# Patient Record
Sex: Female | Born: 1960 | Race: White | Hispanic: No | Marital: Single | Smoking: Never smoker
Health system: Southern US, Community
[De-identification: ages and names within clinical notes are randomized; demographics above are authoritative.]

## PROBLEM LIST (undated history)

## (undated) DIAGNOSIS — C801 Malignant (primary) neoplasm, unspecified: Secondary | ICD-10-CM

## (undated) DIAGNOSIS — C50919 Malignant neoplasm of unspecified site of unspecified female breast: Secondary | ICD-10-CM

## (undated) DIAGNOSIS — Z803 Family history of malignant neoplasm of breast: Secondary | ICD-10-CM

## (undated) DIAGNOSIS — Z923 Personal history of irradiation: Secondary | ICD-10-CM

## (undated) DIAGNOSIS — E039 Hypothyroidism, unspecified: Secondary | ICD-10-CM

## (undated) DIAGNOSIS — E079 Disorder of thyroid, unspecified: Secondary | ICD-10-CM

## (undated) HISTORY — DX: Disorder of thyroid, unspecified: E07.9

## (undated) HISTORY — PX: WISDOM TOOTH EXTRACTION: SHX21

## (undated) HISTORY — DX: Family history of malignant neoplasm of breast: Z80.3

## (undated) HISTORY — PX: TONSILLECTOMY: SUR1361

## (undated) MED FILL — Fosaprepitant Dimeglumine For IV Infusion 150 MG (Base Eq): INTRAVENOUS | Qty: 5 | Status: AC

---
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
## 1997-12-19 ENCOUNTER — Other Ambulatory Visit: Admission: RE | Admit: 1997-12-19 | Discharge: 1997-12-19 | Payer: Self-pay | Admitting: Obstetrics and Gynecology

## 1999-01-14 ENCOUNTER — Other Ambulatory Visit: Admission: RE | Admit: 1999-01-14 | Discharge: 1999-01-14 | Payer: Self-pay | Admitting: Obstetrics and Gynecology

## 2000-03-10 ENCOUNTER — Other Ambulatory Visit: Admission: RE | Admit: 2000-03-10 | Discharge: 2000-03-10 | Payer: Self-pay | Admitting: Obstetrics and Gynecology

## 2001-03-22 ENCOUNTER — Other Ambulatory Visit: Admission: RE | Admit: 2001-03-22 | Discharge: 2001-03-22 | Payer: Self-pay | Admitting: Obstetrics and Gynecology

## 2002-04-04 ENCOUNTER — Encounter: Admission: RE | Admit: 2002-04-04 | Discharge: 2002-04-04 | Payer: Self-pay | Admitting: Vascular Surgery

## 2002-04-04 ENCOUNTER — Encounter: Payer: Self-pay | Admitting: Obstetrics and Gynecology

## 2002-04-24 ENCOUNTER — Other Ambulatory Visit: Admission: RE | Admit: 2002-04-24 | Discharge: 2002-04-24 | Payer: Self-pay | Admitting: Obstetrics and Gynecology

## 2003-04-20 ENCOUNTER — Encounter: Admission: RE | Admit: 2003-04-20 | Discharge: 2003-04-20 | Payer: Self-pay | Admitting: Obstetrics and Gynecology

## 2003-05-07 ENCOUNTER — Other Ambulatory Visit: Admission: RE | Admit: 2003-05-07 | Discharge: 2003-05-07 | Payer: Self-pay | Admitting: Obstetrics and Gynecology

## 2004-06-02 ENCOUNTER — Encounter: Admission: RE | Admit: 2004-06-02 | Discharge: 2004-06-02 | Payer: Self-pay | Admitting: Obstetrics and Gynecology

## 2005-06-04 ENCOUNTER — Encounter: Admission: RE | Admit: 2005-06-04 | Discharge: 2005-06-04 | Payer: Self-pay | Admitting: Obstetrics and Gynecology

## 2006-06-07 ENCOUNTER — Encounter: Admission: RE | Admit: 2006-06-07 | Discharge: 2006-06-07 | Payer: Self-pay | Admitting: Obstetrics and Gynecology

## 2007-06-13 ENCOUNTER — Encounter: Admission: RE | Admit: 2007-06-13 | Discharge: 2007-06-13 | Payer: Self-pay | Admitting: Obstetrics and Gynecology

## 2008-06-18 ENCOUNTER — Encounter: Admission: RE | Admit: 2008-06-18 | Discharge: 2008-06-18 | Payer: Self-pay | Admitting: Obstetrics and Gynecology

## 2009-07-02 ENCOUNTER — Encounter: Admission: RE | Admit: 2009-07-02 | Discharge: 2009-07-02 | Payer: Self-pay | Admitting: Obstetrics and Gynecology

## 2010-06-19 ENCOUNTER — Other Ambulatory Visit: Payer: Self-pay | Admitting: Family Medicine

## 2010-06-19 DIAGNOSIS — Z1231 Encounter for screening mammogram for malignant neoplasm of breast: Secondary | ICD-10-CM

## 2010-07-08 ENCOUNTER — Ambulatory Visit
Admission: RE | Admit: 2010-07-08 | Discharge: 2010-07-08 | Disposition: A | Discharge status: Home / Self Care | Payer: Federal, State, Local not specified - PPO | Source: Ambulatory Visit | Attending: Family Medicine | Admitting: Family Medicine

## 2010-07-08 DIAGNOSIS — Z1231 Encounter for screening mammogram for malignant neoplasm of breast: Secondary | ICD-10-CM

## 2011-07-27 ENCOUNTER — Other Ambulatory Visit: Payer: Self-pay | Admitting: Family Medicine

## 2011-07-27 DIAGNOSIS — Z1231 Encounter for screening mammogram for malignant neoplasm of breast: Secondary | ICD-10-CM

## 2011-08-04 ENCOUNTER — Other Ambulatory Visit: Payer: Self-pay | Admitting: Family Medicine

## 2011-08-04 ENCOUNTER — Other Ambulatory Visit (HOSPITAL_COMMUNITY)
Admission: RE | Admit: 2011-08-04 | Discharge: 2011-08-04 | Disposition: A | Discharge status: Home / Self Care | Payer: BC Managed Care – PPO | Source: Ambulatory Visit | Attending: Family Medicine | Admitting: Family Medicine

## 2011-08-04 DIAGNOSIS — Z124 Encounter for screening for malignant neoplasm of cervix: Secondary | ICD-10-CM | POA: Insufficient documentation

## 2011-08-11 ENCOUNTER — Ambulatory Visit
Admission: RE | Admit: 2011-08-11 | Discharge: 2011-08-11 | Disposition: A | Discharge status: Home / Self Care | Payer: BC Managed Care – PPO | Source: Ambulatory Visit | Attending: Family Medicine | Admitting: Family Medicine

## 2011-08-11 DIAGNOSIS — Z1231 Encounter for screening mammogram for malignant neoplasm of breast: Secondary | ICD-10-CM

## 2011-08-18 ENCOUNTER — Other Ambulatory Visit: Payer: Self-pay | Admitting: Family Medicine

## 2011-08-18 DIAGNOSIS — R928 Other abnormal and inconclusive findings on diagnostic imaging of breast: Secondary | ICD-10-CM

## 2013-12-15 ENCOUNTER — Ambulatory Visit: Payer: Self-pay | Admitting: Family Medicine

## 2014-01-14 ENCOUNTER — Ambulatory Visit: Payer: Self-pay | Admitting: Family Medicine

## 2014-02-13 ENCOUNTER — Ambulatory Visit: Payer: Self-pay | Admitting: Family Medicine

## 2014-08-16 ENCOUNTER — Other Ambulatory Visit: Payer: Self-pay | Admitting: Family Medicine

## 2014-08-16 ENCOUNTER — Other Ambulatory Visit (HOSPITAL_COMMUNITY)
Admission: RE | Admit: 2014-08-16 | Discharge: 2014-08-16 | Disposition: A | Discharge status: Home / Self Care | Payer: 59 | Source: Ambulatory Visit | Attending: Family Medicine | Admitting: Family Medicine

## 2014-08-16 DIAGNOSIS — Z124 Encounter for screening for malignant neoplasm of cervix: Secondary | ICD-10-CM | POA: Insufficient documentation

## 2014-08-20 LAB — CYTOLOGY - PAP

## 2014-10-18 ENCOUNTER — Encounter: Payer: Self-pay | Admitting: Podiatry

## 2014-10-18 ENCOUNTER — Ambulatory Visit (INDEPENDENT_AMBULATORY_CARE_PROVIDER_SITE_OTHER): Payer: PRIVATE HEALTH INSURANCE | Admitting: Podiatry

## 2014-10-18 ENCOUNTER — Ambulatory Visit (INDEPENDENT_AMBULATORY_CARE_PROVIDER_SITE_OTHER): Payer: PRIVATE HEALTH INSURANCE

## 2014-10-18 VITALS — BP 115/55 | HR 62 | Resp 16 | Ht 71.0 in | Wt 170.0 lb

## 2014-10-18 DIAGNOSIS — M216X2 Other acquired deformities of left foot: Secondary | ICD-10-CM

## 2014-10-18 DIAGNOSIS — M779 Enthesopathy, unspecified: Secondary | ICD-10-CM | POA: Diagnosis not present

## 2014-10-18 DIAGNOSIS — M7742 Metatarsalgia, left foot: Secondary | ICD-10-CM | POA: Diagnosis not present

## 2014-10-18 DIAGNOSIS — M7740 Metatarsalgia, unspecified foot: Secondary | ICD-10-CM | POA: Insufficient documentation

## 2014-10-18 NOTE — Progress Notes (Signed)
Subjective:     Patient ID: Kathryn Lucas, female   DOB: 1960/04/27, 54 y.o.   MRN: [id number]  HPI 54 year old female presents the office today for concerns of pain to the left foot under the ball of the 3rd toe joint. This has been ongoing for about 3 weeks. She believes that this started from wearing bad shoes and she has lost the padding to the ball of the foot. She does not have any pain at rest and only has pain with weightbearing at times. Denies any history of injury or trauma. Denies any swelling or redness. She has been wearing a gel pad to the ball of the foot which seems to help. No other complaints at his time.   Review of Systems  All other systems reviewed and are negative.      Objective:   Physical Exam AAO x3, NAD DP/PT pulses palpable bilaterally, CRT less than 3 seconds Protective sensation intact with Simms Weinstein monofilament, vibratory sensation intact, Achilles tendon reflex intact No areas of tenderness to bilateral lower extremities at this time. This prominence the metatarsal heads plantarly with atrophy of the fat pad. There is no specific area pinpoint bony tenderness or pain the vibratory sensation. No pain with MTPJ range of motion. There is no edema, erythema, increased warmth. MMT 5/5, ROM WNL. Adductovarus rotation of the 4th and 5th toes bilaterally. Decrease in medial arch height bilaterally.  No open lesions or pre-ulcerative lesions.  No overlying edema, erythema, increase in warmth to bilateral lower extremities.  No pain with calf compression, swelling, warmth, erythema bilaterally.      Assessment:     Metatarsalgia, fat pad atrophy with  prominent metatarsal heads.    Plan:     -Treatment options discussed including all alternatives, risks, and complications -X-rays were obtained and reviewed with the patient.  -Discussed etiology of her symptoms.  -Disensed metatarsal pads to help offload the area -Discussed orthotics. She will consider CMO.   -Follow-up as needed orif any problems arise or is symptoms are not resolved in the next 4 weeks. In the meantime, encouraged to call the office with any questions, concerns, change in symptoms.   Celesta Gentile, DPM

## 2014-10-22 DIAGNOSIS — M79673 Pain in unspecified foot: Secondary | ICD-10-CM

## 2014-12-06 DIAGNOSIS — M79673 Pain in unspecified foot: Secondary | ICD-10-CM

## 2015-07-19 DIAGNOSIS — M79673 Pain in unspecified foot: Secondary | ICD-10-CM

## 2015-07-30 ENCOUNTER — Encounter: Payer: Self-pay | Admitting: Podiatry

## 2015-07-30 ENCOUNTER — Ambulatory Visit (INDEPENDENT_AMBULATORY_CARE_PROVIDER_SITE_OTHER): Payer: PRIVATE HEALTH INSURANCE | Admitting: Podiatry

## 2015-07-30 ENCOUNTER — Ambulatory Visit (INDEPENDENT_AMBULATORY_CARE_PROVIDER_SITE_OTHER): Payer: PRIVATE HEALTH INSURANCE

## 2015-07-30 VITALS — BP 102/57 | HR 63 | Resp 18

## 2015-07-30 DIAGNOSIS — M722 Plantar fascial fibromatosis: Secondary | ICD-10-CM

## 2015-07-30 DIAGNOSIS — R52 Pain, unspecified: Secondary | ICD-10-CM

## 2015-07-30 MED ORDER — MELOXICAM 15 MG PO TABS: 15.0000 mg | ORAL_TABLET | Freq: Every day | ORAL | Status: DC

## 2015-07-30 NOTE — Progress Notes (Signed)
Patient ID: Kathryn Lucas, female   DOB: [birth year], 55 y.o.   MRN: [id number]  Subjective: 55 year old female presents the office with concerns of right heel pain which is been ongoing the last 1 month. She states that she has pain in the morning and she first gets up and after walking and exercising. She denies any recent injury or trauma. No swelling or redness. No tingling or numbness. She has been icing as well as some stretching that her trainer instructed her to do.Denies any systemic complaints such as fevers, chills, nausea, vomiting. No acute changes since last appointment, and no other complaints at this time.   Objective: AAO x3, NAD DP/PT pulses palpable bilaterally, CRT less than 3 seconds Tenderness to palpation along the plantar medial tubercle of the calcaneus at the insertion of plantar fascia on the right foot. There is no pain along the course of the plantar fascia within the arch of the foot. Plantar fascia appears to be intact. There is no pain with lateral compression of the calcaneus or pain with vibratory sensation. There is no pain along the course or insertion of the achilles tendon. No other areas of tenderness to bilateral lower extremities. No areas of pinpoint bony tenderness or pain with vibratory sensation. MMT 5/5, ROM WNL. No edema, erythema, increase in warmth to bilateral lower extremities.  No open lesions or pre-ulcerative lesions.  No pain with calf compression, swelling, warmth, erythema  Assessment: Right heel pain, likely plantar fasciitis  Plan: -All treatment options discussed with the patient including all alternatives, risks, complications.  -X-rays were obtained and reviewed with the patient. No significant calcaneal spurring is present. No evidence of acute fracture or stress fracture. -Discussed steroid injection but she wishes to hold off. -Prescribed mobic. Discussed side effects of the medication and directed to stop if any are to occur and call  the office.  -Plantar fascial brace -Ice -Stretching -Discussed orthotics -Follow-up in 4 weeks or sooner if any problems arise. In the meantime, encouraged to call the office with any questions, concerns, change in symptoms.   Celesta Gentile, DPM

## 2015-07-30 NOTE — Patient Instructions (Signed)

## 2015-08-27 ENCOUNTER — Ambulatory Visit: Payer: PRIVATE HEALTH INSURANCE | Admitting: Podiatry

## 2016-03-16 HISTORY — PX: BREAST BIOPSY: SHX20

## 2016-06-02 ENCOUNTER — Other Ambulatory Visit: Payer: Self-pay | Admitting: Family Medicine

## 2016-06-02 DIAGNOSIS — R928 Other abnormal and inconclusive findings on diagnostic imaging of breast: Secondary | ICD-10-CM

## 2016-06-11 ENCOUNTER — Ambulatory Visit
Admission: RE | Admit: 2016-06-11 | Discharge: 2016-06-11 | Disposition: A | Discharge status: Home / Self Care | Payer: 59 | Source: Ambulatory Visit | Attending: Family Medicine | Admitting: Family Medicine

## 2016-06-11 ENCOUNTER — Other Ambulatory Visit: Payer: Self-pay | Admitting: Family Medicine

## 2016-06-11 DIAGNOSIS — N6320 Unspecified lump in the left breast, unspecified quadrant: Secondary | ICD-10-CM

## 2016-06-11 DIAGNOSIS — R928 Other abnormal and inconclusive findings on diagnostic imaging of breast: Secondary | ICD-10-CM

## 2016-06-19 ENCOUNTER — Ambulatory Visit
Admission: RE | Admit: 2016-06-19 | Discharge: 2016-06-19 | Disposition: A | Discharge status: Home / Self Care | Payer: 59 | Source: Ambulatory Visit | Attending: Family Medicine | Admitting: Family Medicine

## 2016-06-19 ENCOUNTER — Other Ambulatory Visit: Payer: Self-pay | Admitting: Family Medicine

## 2016-06-19 DIAGNOSIS — N6320 Unspecified lump in the left breast, unspecified quadrant: Secondary | ICD-10-CM

## 2016-07-07 ENCOUNTER — Other Ambulatory Visit: Payer: Self-pay | Admitting: General Surgery

## 2016-07-07 ENCOUNTER — Telehealth: Payer: Self-pay | Admitting: Hematology and Oncology

## 2016-07-07 DIAGNOSIS — C50512 Malignant neoplasm of lower-outer quadrant of left female breast: Secondary | ICD-10-CM

## 2016-07-07 NOTE — Telephone Encounter (Signed)
Appt has been scheduled for the pt to see Dr. Lindi Adie on 5/2 at 830am. Unable to reach the pt. Lft vm w/appt date and time. Letter mailed to the pt.

## 2016-07-08 ENCOUNTER — Encounter: Payer: Self-pay | Admitting: Hematology and Oncology

## 2016-07-13 ENCOUNTER — Telehealth: Payer: Self-pay | Admitting: General Surgery

## 2016-07-13 ENCOUNTER — Ambulatory Visit
Admission: RE | Admit: 2016-07-13 | Discharge: 2016-07-13 | Disposition: A | Discharge status: Home / Self Care | Payer: 59 | Source: Ambulatory Visit | Attending: General Surgery | Admitting: General Surgery

## 2016-07-13 DIAGNOSIS — C50512 Malignant neoplasm of lower-outer quadrant of left female breast: Secondary | ICD-10-CM

## 2016-07-13 MED ORDER — GADOBENATE DIMEGLUMINE 529 MG/ML IV SOLN
17.0000 mL | Freq: Once | INTRAVENOUS | Status: AC | PRN
  Administered 2016-07-13: 17 mL via INTRAVENOUS

## 2016-07-13 NOTE — Telephone Encounter (Signed)
Left message that we need additional biopsy.  Will set up MR guided left breast biopsy.  I still recommended that she keep appt this week with Dr. Lindi Adie.

## 2016-07-15 ENCOUNTER — Ambulatory Visit: Payer: 59 | Attending: Hematology and Oncology | Admitting: Physical Therapy

## 2016-07-15 ENCOUNTER — Other Ambulatory Visit: Payer: Self-pay | Admitting: General Surgery

## 2016-07-15 ENCOUNTER — Telehealth: Payer: Self-pay | Admitting: *Deleted

## 2016-07-15 ENCOUNTER — Ambulatory Visit (HOSPITAL_BASED_OUTPATIENT_CLINIC_OR_DEPARTMENT_OTHER): Payer: 59 | Admitting: Hematology and Oncology

## 2016-07-15 ENCOUNTER — Encounter: Payer: Self-pay | Admitting: Physical Therapy

## 2016-07-15 ENCOUNTER — Encounter: Payer: Self-pay | Admitting: Hematology and Oncology

## 2016-07-15 DIAGNOSIS — Z170 Estrogen receptor positive status [ER+]: Secondary | ICD-10-CM | POA: Diagnosis not present

## 2016-07-15 DIAGNOSIS — C50512 Malignant neoplasm of lower-outer quadrant of left female breast: Secondary | ICD-10-CM

## 2016-07-15 DIAGNOSIS — R293 Abnormal posture: Secondary | ICD-10-CM | POA: Diagnosis present

## 2016-07-15 DIAGNOSIS — C50412 Malignant neoplasm of upper-outer quadrant of left female breast: Secondary | ICD-10-CM | POA: Insufficient documentation

## 2016-07-15 MED ORDER — ANASTROZOLE 1 MG PO TABS: 1.0000 mg | ORAL_TABLET | Freq: Every day | ORAL | 3 refills | Status: DC

## 2016-07-15 NOTE — Telephone Encounter (Signed)
Ordered oncotype (core) per Dr. Lindi Adie.  Faxed requisition to pathology and confirmed receipt.

## 2016-07-15 NOTE — Assessment & Plan Note (Signed)
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 After reviewing the pathology in detail, we proceeded to discuss the different treatment options between surgery, radiation, chemotherapy, antiestrogen therapies.  Recommendations: 1. Oncotype DX testing to determine if chemotherapy would be of any benefit followed by 2. chemotherapy if high intermediate to high risk 3. Antiestrogen therapy if low risk followed by 4. Breast conserving surgery R mastectomy 5. Adjuvant radiation therapy followed by 6. Adjuvant antiestrogen therapy  Oncotype counseling: I discussed Oncotype DX test. I explained to the patient that this is a 21 gene panel to evaluate patient tumors DNA to calculate recurrence score. This would help determine whether patient has high risk or intermediate risk or low risk breast cancer. She understands that if her tumor was found to be high risk, she would benefit from systemic chemotherapy. If low risk, no need of chemotherapy. If she was found to be intermediate risk, we would need to evaluate the score as well as other risk factors and determine if an abbreviated chemotherapy may be of benefit.  Because this result may take a few days to come back, I recommended starting the patient on neoadjuvant antiestrogen therapy with anastrozole 1 mg daily. Anastrozole counseling: We discussed the risks and benefits of anti-estrogen therapy with aromatase  inhibitors. These include but not limited to insomnia, hot flashes, mood changes, vaginal dryness, bone density loss, and weight gain. We strongly believe that the benefits far outweigh the risks. Patient understands these risks and consented to starting treatment.   

## 2016-07-15 NOTE — Patient Instructions (Addendum)
Physical Therapy Information for After Breast Cancer Surgery/Treatment:   Lymphedema is a swelling condition that you may be at risk for in your arm if you have lymph nodes removed from the armpit area.  After a sentinel node biopsy, the risk is approximately 5-9% and is higher after an axillary node dissection.  There is treatment available for this condition and it is not life-threatening.  Contact your physician or physical therapist with concerns.  You may begin the 4 shoulder/posture exercises (see additional sheet) when permitted by your physician (typically a week after surgery).  If you have drains, you may need to wait until those are removed before beginning range of motion exercises.  A general recommendation is to not lift your arms above shoulder height until drains are removed.  These exercises should be done to your tolerance and gently.  This is not a "no pain/no gain" type of recovery so listen to your body and stretch into the range of motion that you can tolerate, stopping if you have pain.  If you are having immediate reconstruction, ask your plastic surgeon about doing exercises as he or she may want you to wait.  We encourage you to attend the free one time ABC (After Breast Cancer) class offered by  [facility].  You will learn information related to lymphedema risk, prevention and treatment and additional exercises to regain mobility following surgery.  You can call [phone] for more information.  This is offered the 1st and 3rd Monday of each month.  You only attend the class one time.  While undergoing any medical procedure or treatment, try to avoid blood pressure being taken or needle sticks from occurring on the arm on the side of cancer.   This recommendation begins after surgery and continues for the rest of your life.  This may help reduce your risk of getting lymphedema (swelling in your arm).  An excellent resource for those seeking information  on lymphedema is the National Lymphedema Network's web site. It can be accessed at www.lymphnet.org  If you notice swelling in your hand, arm or breast at any time following surgery (even if it is many years from now), please contact your doctor or physical therapist to discuss this.  Lymphedema can be treated at any time but it is easier for you if it is treated early on.  If you feel like your shoulder motion is not returning to normal in a reasonable amount of time, please contact your surgeon or physical therapist.  Marti C. Shameek Nyquist, PT, CLT [phone]; [address] [place], [place] [address] ABC CLASS After Breast Cancer Class  After Breast Cancer Class is a specially designed exercise class to assist you in a safe recover after having breast cancer surgery.  In this class you will learn how to get back to full function whether your drains were just removed or if you had surgery a month ago.  This one-time class is held the 1st and 3rd Monday of every month from 11:00 a.m. until 12:00 noon at the [facility] located at [address] [place], [place] [address]  This class is FREE and space is limited. For more information or to register for the next available class, call [phone].  Class Goals   Understand specific stretches to improve the flexibility of you chest and shoulder.  Learn ways to safely strengthen your upper body and improve your posture.  Understand the warning signs of infection and why   you may be at risk for an arm infection.  Learn about Lymphedema and prevention.  ** You do not attend this class until after surgery.  Drains must be removed to participate  Patient was instructed today in a home exercise program today for post op shoulder range of motion. These included active assist shoulder flexion in sitting, scapular retraction, wall walking with shoulder abduction, and hands behind head external rotation.  She was  encouraged to do these twice a day, holding 3 seconds and repeating 5 times when permitted by her physician.   Physical Therapy Information for After Breast Cancer Surgery/Treatment:   Lymphedema is a swelling condition that you may be at risk for in your arm if you have lymph nodes removed from the armpit area.  After a sentinel node biopsy, the risk is approximately 5-9% and is higher after an axillary node dissection.  There is treatment available for this condition and it is not life-threatening.  Contact your physician or physical therapist with concerns.  You may begin the 4 shoulder/posture exercises (see additional sheet) when permitted by your physician (typically a week after surgery).  If you have drains, you may need to wait until those are removed before beginning range of motion exercises.  A general recommendation is to not lift your arms above shoulder height until drains are removed.  These exercises should be done to your tolerance and gently.  This is not a "no pain/no gain" type of recovery so listen to your body and stretch into the range of motion that you can tolerate, stopping if you have pain.  If you are having immediate reconstruction, ask your plastic surgeon about doing exercises as he or she may want you to wait.  We encourage you to attend the free one time ABC (After Breast Cancer) class offered by [facility].  You will learn information related to lymphedema risk, prevention and treatment and additional exercises to regain mobility following surgery.  You can call [phone] for more information.  This is offered the 1st and 3rd Monday of each month.  You only attend the class one time.  While undergoing any medical procedure or treatment, try to avoid blood pressure being taken or needle sticks from occurring on the arm on the side of cancer.   This recommendation begins after surgery and continues for the rest of your life.  This may help  reduce your risk of getting lymphedema (swelling in your arm).  An excellent resource for those seeking information on lymphedema is the National Lymphedema Network's web site. It can be accessed at [place].org  If you notice swelling in your hand, arm or breast at any time following surgery (even if it is many years from now), please contact your doctor or physical therapist to discuss this.  Lymphedema can be treated at any time but it is easier for you if it is treated early on.  If you feel like your shoulder motion is not returning to normal in a reasonable amount of time, please contact your surgeon or physical therapist.  [facility]. [place], [place], [place] [phone]; [address], [place], [place] [address] ABC CLASS After Breast Cancer Class  After Breast Cancer Class is a specially designed exercise class to assist you in a safe recover after having breast cancer surgery.  In this class you will learn how to get back to full function whether your drains were just removed or if you had surgery a month ago.  This one-time class is held the 1st and 3rd Monday of every month from 11:00 a.m. until 12:00 noon at the [facility] located at [place], [place] [address]  This class is FREE and space is limited. For more information or to register for the next available class, call [phone].  Class Goals   Understand specific stretches to improve the flexibility of you chest and shoulder.  Learn ways to safely strengthen your upper body and improve your posture.  Understand the warning signs of infection and why you may be at risk for an arm infection.  Learn about Lymphedema and prevention.  ** You do not attend this class until after surgery.  Drains must be removed to participate  Patient was instructed today in a home exercise program today for post op shoulder range of motion. These included active assist shoulder flexion in sitting,  scapular retraction, wall walking with shoulder abduction, and hands behind head external rotation.  She was encouraged to do these twice a day, holding 3 seconds and repeating 5 times when permitted by her physician.

## 2016-07-15 NOTE — Therapy (Signed)
[place], [place], [address] Phone: [phone]   Fax:  [phone]  Physical Therapy Evaluation  Patient Details  Name: Kathryn Lucas MRN: [id number] Date of Birth: 1960/06/26 Referring Provider: Dr. Nicholas Lose  Encounter Date: 07/15/2016      PT End of Session - 07/15/16 1003    Visit Number 1   Number of Visits 1   PT Start Time 0906   PT Stop Time 0930   PT Time Calculation (min) 24 min   Activity Tolerance Patient tolerated treatment well   Behavior During Therapy [facility] for tasks assessed/performed      Past Medical History:  Diagnosis Date  . Thyroid disease     History reviewed. No pertinent surgical history.  There were no vitals filed for this visit.       Subjective Assessment - 07/15/16 0931    Subjective Patient is here today to meet with her medical oncologist and requested a baseline assessment by PT.   Pertinent History Patient was diagnosed on 05/28/16 with left invasive breast cancer. There are 3 masses measuring a total of 9 cm and is located in the upper outer quadrant. It is ER/PR positive and HER2 negative with a Ki67 of 15%. She has no other medical problems.   Patient Stated Goals Reduce lymphedema risk and learn post op shoulder ROM HEP   Currently in Pain? No/denies            [facility] PT Assessment - 07/15/16 0001      Assessment   Medical Diagnosis Left breast cancer   Referring Provider Dr. Nicholas Lose   Onset Date/Surgical Date 05/28/16   Hand Dominance Right   Prior Therapy none     Precautions   Precautions Other (comment)   Precaution Comments active cancer     Restrictions   Weight Bearing Restrictions No     Balance Screen   Has the patient fallen in the past 6 months No   Has the patient had a decrease in activity level because of a fear of falling?  No   Is the patient reluctant to leave their home because of a fear of falling?  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[employer]   Leisure She does not exercise     Cognition   Overall Cognitive Status Within Functional Limits for tasks assessed     Posture/Postural Control   Posture/Postural Control Postural limitations   Postural Limitations Rounded Shoulders;Forward head     ROM / Strength   AROM / PROM / Strength AROM;Strength     AROM   AROM Assessment Site Shoulder;Cervical   Right/Left Shoulder Right;Left   Right Shoulder Extension 54 Degrees   Right Shoulder Flexion 155 Degrees   Right Shoulder ABduction 165 Degrees   Right Shoulder Internal Rotation 65 Degrees   Right Shoulder External Rotation 77 Degrees   Left Shoulder Extension 60 Degrees   Left Shoulder Flexion 147 Degrees   Left Shoulder ABduction 171 Degrees   Left Shoulder Internal Rotation 62 Degrees   Left Shoulder External Rotation 83 Degrees   Cervical Flexion WNL   Cervical Extension WNL   Cervical - Right Side Bend WNL   Cervical - Left Side Bend  WNL   Cervical - Right Rotation WNL   Cervical - Left Rotation WNL     Strength   Overall Strength Within functional limits for tasks performed           LYMPHEDEMA/ONCOLOGY QUESTIONNAIRE - 07/15/16 0937      Type   Cancer Type Left breast cancer     Lymphedema Assessments   Lymphedema Assessments Upper extremities     Right Upper Extremity Lymphedema   10 cm Proximal to Olecranon Process 28.3 cm   Olecranon Process 25 cm   10 cm Proximal to Ulnar Styloid Process 21.1 cm   Just Proximal to Ulnar Styloid Process 15.4 cm   Across Hand at [employer] 17.8 cm   At [place] of 2nd Digit 6.3 cm     Left Upper Extremity Lymphedema   10 cm Proximal to Olecranon Process 29.6 cm   Olecranon Process 25.2 cm   10 cm  Proximal to Ulnar Styloid Process 21.8 cm   Just Proximal to Ulnar Styloid Process 15.2 cm   Across Hand at [employer] 17.8 cm   At [place] of 2nd Digit 6.3 cm      Patient was instructed today in a home exercise program today for post op shoulder range of motion. These included active assist shoulder flexion in sitting, scapular retraction, wall walking with shoulder abduction, and hands behind head external rotation.  She was encouraged to do these twice a day, holding 3 seconds and repeating 5 times when permitted by her physician.         PT Education - 07/15/16 1002    Education provided Yes   Education Details Lymphedema risk reduction and post op shoulder ROM HEP   Person(s) Educated Patient   Methods Explanation;Demonstration;Handout   Comprehension Returned demonstration;Verbalized understanding              Breast Clinic Goals - 07/15/16 1006      Patient will be able to verbalize understanding of pertinent lymphedema risk reduction practices relevant to her diagnosis specifically related to skin care.   Time 1   Period Days   Status Achieved     Patient will be able to return demonstrate and/or verbalize understanding of the post-op home exercise program related to regaining shoulder range of motion.   Time 1   Period Days   Status Achieved     Patient will be able to verbalize understanding of the importance of attending the postoperative After Breast Cancer Class for further lymphedema risk reduction education and therapeutic exercise.   Time 1   Period Days   Status Achieved              Plan - 07/15/16 1003    Clinical Impression Statement Patient was diagnosed on 05/28/16 with left invasive breast cancer. There are 3 masses measuring a total of 9 cm and is located in the upper outer quadrant. It is ER/PR positive and HER2 negative with a Ki67 of 15%. She has no other medical problems. She is planning to have Oncotype testing and then either  neoadjuvant chemotherapy or anti-estrogen therapy followed by a left lumpectomy or mastectomy with a sentinel node biopsy, radiation, and anti-estrogen therapy. She may benefit from post op PT to regain shoulder ROM and reduce lymphedema risk. Due to her lack of comorbidities, her eval is of low complexity.   Rehab Potential Excellent   Clinical Impairments Affecting Rehab Potential None   PT Frequency One time visit  PT Treatment/Interventions Patient/family education;Therapeutic exercise   PT Next Visit Plan Will f/u after surgery to determine PT needs   PT Home Exercise Plan Post op shoulder ROM HEP   Consulted and Agree with Plan of Care Patient      Patient will benefit from skilled therapeutic intervention in order to improve the following deficits and impairments:  Postural dysfunction, Decreased knowledge of precautions, Pain, Impaired UE functional use, Decreased range of motion  Visit Diagnosis: Carcinoma of upper-outer quadrant of left breast in female, estrogen receptor positive ([place]) - Plan: PT plan of care cert/re-cert  Abnormal posture - Plan: PT plan of care cert/re-cert   Patient will follow up at outpatient cancer rehab if needed following surgery.  If the patient requires physical therapy at that time, a specific plan will be dictated and sent to the referring physician for approval. The patient was educated today on appropriate basic range of motion exercises to begin post operatively and the importance of attending the After Breast Cancer class following surgery.  Patient was educated today on lymphedema risk reduction practices as it pertains to recommendations that will benefit the patient immediately following surgery.  She verbalized good understanding.  No additional physical therapy is indicated at this time.     Problem List Patient Active Problem List   Diagnosis Date Noted  . Malignant neoplasm of lower-outer quadrant of left breast of female, estrogen  receptor positive ([place]) 07/15/2016  . Plantar fasciitis 07/30/2015  . Metatarsalgia 10/18/2014   Annia Friendly, PT 07/15/16 10:08 AM  [place], [place], [address] Phone: [phone]   Fax:  [phone]  Name: Kathryn Lucas MRN: [id number] Date of Birth: 12-31-1960

## 2016-07-15 NOTE — Progress Notes (Signed)
Nutrition Assessment  Reason for Assessment:  Pt seen at the request of Dr. Lindi Adie  ASSESSMENT:   56 year old female with new diagnosis of left breast cancer. Past medical history reviewed.  Patient reports normal appetite, stable weight   Medications:  reviewed  Labs: reviewed  Anthropometrics:   Height: 71 inches Weight: 183 lb BMI: 25   NUTRITION DIAGNOSIS: Food and nutrition related knowledge deficit related to new diagnosis of breast cancer as evidenced by no prior need for nutrition related information.  INTERVENTION:  Patient with questions regarding proper diet with breast cancer.  Discussed and provided packet of information regarding nutritional tips for breast cancer patients.   Questions answered.  Teachback method used.  Contact information provided and patient knows to contact me with questions/concerns.   MONITORING, EVALUATION, and GOAL: Pt will consume a healthy plant based diet to maintain lean body mass throughout treatment.   Doni Bacha B. Zenia Resides, [place], [place] Registered Dietitian [phone] (pager)

## 2016-07-15 NOTE — Progress Notes (Signed)
[facility] CONSULT NOTE  Patient Care Team: Kelton Pillar, MD as PCP - General (Family Medicine)  CHIEF COMPLAINTS/PURPOSE OF CONSULTATION:  Newly diagnosed breast cancer  HISTORY OF PRESENTING ILLNESS:  Kathryn Lucas 56 y.o. female is here because of recent diagnosis of  left breast cancer. She presented with a palpable mass in the left breast. There was an area of concern on a screening mammogram in 2013 but it was not fully imaged that time. On the most recent mammogram there were 3 areas of concern in the left breast. The initial ultrasound revealed 3 masses 2.2 cm at 3:00 position, 2.5 cm mass at 5:30 position and a 7 mm mass at 6:30 position. 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 She underwent a breast MRI on 07/13/2016 which revealed an abnormal enhancement that spanned a total of 9 cm x 6.4 x 5.3 cm. There were no enlarged lymph nodes on the MRI. She was seen by Dr. Barry Dienes and was referred to me for discussion regarding neoadjuvant treatment options.  I reviewed her records extensively and collaborated the history with the patient.  SUMMARY OF ONCOLOGIC HISTORY:   Malignant neoplasm of lower-outer quadrant of left breast of female, estrogen receptor positive ([place])   06/11/2016 Mammogram    Palpable left breast masses 3:00 position: 2.2 cm; 5:30 position: 2.5 cm; 6:30 position: 0.7 cm      06/19/2016 Initial Diagnosis    Left breast biopsy 3:30: IDC with DCIS grade 1, ER 90%, PR 50%, Ki-67 15%, HER-2 negative ratio 1.13; biopsy 5:30 position: IDC grade 1      07/13/2016 Breast MRI    Large area of abnormal enhancement lower inner and lower outer quadrants left breast spanning 9 cm x 6.4 cm x 5.3 cm, no abnormal enlarged lymph nodes; T3 N0 stage II a (New AJCC staging)       MEDICAL HISTORY:  Past Medical History:  Diagnosis Date  . Thyroid disease      SURGICAL HISTORY: No prior surgeries  SOCIAL HISTORY: Denies any tobacco alcohol or recreational drug use. G0P0  FAMILY HISTORY: Family History  Problem Relation Age of Onset  . Breast cancer Mother     ALLERGIES:  has No Known Allergies.  MEDICATIONS:  Current Outpatient Prescriptions  Medication Sig Dispense Refill  . anastrozole (ARIMIDEX) 1 MG tablet Take 1 tablet (1 mg total) by mouth daily. 90 tablet 3  . fluticasone (FLONASE) 50 MCG/ACT nasal spray Place into the nose.    . levothyroxine (SYNTHROID, LEVOTHROID) 175 MCG tablet Take by mouth.    . meloxicam (MOBIC) 15 MG tablet Take 1 tablet (15 mg total) by mouth daily. 30 tablet 2  . SYNTHROID 125 MCG tablet TK 1 T PO ONCE D  0   No current facility-administered medications for this visit.     REVIEW OF SYSTEMS:   Constitutional: Denies fevers, chills or abnormal night sweats Eyes: Denies blurriness of vision, double vision or watery eyes Ears, nose, mouth, throat, and face: Denies mucositis or sore throat Respiratory: Denies cough, dyspnea or wheezes Cardiovascular: Denies palpitation, chest discomfort or lower extremity swelling Gastrointestinal:  Denies nausea, heartburn or change in bowel habits Skin: Denies abnormal skin rashes Lymphatics: Denies new lymphadenopathy or easy bruising Neurological:Denies numbness, tingling or new weaknesses Behavioral/Psych: Mood is stable, no new changes  Breast: Left breast palpable lumps All other systems were reviewed  with the patient and are negative.  PHYSICAL EXAMINATION: ECOG PERFORMANCE STATUS: 0 - Asymptomatic  Vitals:   07/15/16 0825  BP: 94/62  Pulse: 68  Resp: 20  Temp: 97.7 F (36.5 C)   Filed Weights   07/15/16 0825  Weight: 183 lb 12.8 oz (83.4 kg)    GENERAL:alert, no distress and comfortable SKIN: skin color, texture, turgor are normal, no rashes or significant lesions EYES: normal, conjunctiva are pink and non-injected, sclera  clear OROPHARYNX:no exudate, no erythema and lips, buccal mucosa, and tongue normal  NECK: supple, thyroid normal size, non-tender, without nodularity LYMPH:  no palpable lymphadenopathy in the cervical, axillary or inguinal LUNGS: clear to auscultation and percussion with normal breathing effort HEART: regular rate & rhythm and no murmurs and no lower extremity edema ABDOMEN:abdomen soft, non-tender and normal bowel sounds Musculoskeletal:no cyanosis of digits and no clubbing  PSYCH: alert & oriented x 3 with fluent speech NEURO: no focal motor/sensory deficits BREAST: 3 lumps palpable in the left breast. No palpable axillary or supraclavicular lymphadenopathy (exam performed in the presence of a chaperone)   RADIOGRAPHIC STUDIES: I have personally reviewed the radiological reports and agreed with the findings in the report.  ASSESSMENT AND PLAN:  Malignant neoplasm of lower-outer quadrant of left breast of female, estrogen receptor positive ([place]) 06/19/2016 Left breast biopsy 3:30: IDC with DCIS grade 1, ER 90%, PR 50%, Ki-67 15%, HER-2 negative ratio 1.13; biopsy 5:30 position: IDC grade 1  Breast MRI 07/13/2016 Large area of abnormal enhancement lower inner and lower outer quadrants left breast spanning 9 cm x 6.4 cm x 5.3 cm, no abnormal enlarged lymph nodes; T3 N0 stage II a (New AJCC staging)  Pathology and radiology counseling:Discussed with the patient, the details of pathology including the type of breast cancer,the clinical staging, the significance of ER, PR and HER-2/neu receptors and the implications for treatment. After reviewing the pathology in detail, we proceeded to discuss the different treatment options between surgery, radiation, chemotherapy, antiestrogen therapies.  Recommendations: 1. Oncotype DX testing to determine if chemotherapy would be of any benefit followed by 2. chemotherapy if high intermediate to high risk 3. Antiestrogen therapy if low risk followed  by 4. Breast conserving surgery R mastectomy 5. Adjuvant radiation therapy followed by 6. Adjuvant antiestrogen therapy  Oncotype counseling: I discussed Oncotype DX test. I explained to the patient that this is a 21 gene panel to evaluate patient tumors DNA to calculate recurrence score. This would help determine whether patient has high risk or intermediate risk or low risk breast cancer. She understands that if her tumor was found to be high risk, she would benefit from systemic chemotherapy. If low risk, no need of chemotherapy. If she was found to be intermediate risk, we would need to evaluate the score as well as other risk factors and determine if an abbreviated chemotherapy may be of benefit.  Because this result may take a few days to come back, I recommended starting the patient on neoadjuvant antiestrogen therapy with anastrozole 1 mg daily. Anastrozole counseling: We discussed the risks and benefits of anti-estrogen therapy with aromatase inhibitors. These include but not limited to insomnia, hot flashes, mood changes, vaginal dryness, bone density loss, and weight gain. We strongly believe that the benefits far outweigh the risks. Patient understands these risks and consented to starting treatment.  All questions were answered. The patient knows to call the clinic with any problems, questions or concerns.    Rulon Eisenmenger, MD 07/15/16

## 2016-07-24 ENCOUNTER — Inpatient Hospital Stay
Admission: RE | Admit: 2016-07-24 | Discharge: 2016-07-24 | Disposition: A | Discharge status: Home / Self Care | Payer: 59 | Source: Ambulatory Visit | Attending: General Surgery | Admitting: General Surgery

## 2016-07-24 ENCOUNTER — Telehealth: Payer: Self-pay | Admitting: *Deleted

## 2016-07-24 NOTE — Telephone Encounter (Signed)
Left vm regarding oncotype score of 22. Per Dr. Lindi Adie no chemo. Physician team notified.

## 2016-07-24 NOTE — Telephone Encounter (Signed)
Received Oncotype Dx score of 22/14%.

## 2016-07-27 ENCOUNTER — Telehealth: Payer: Self-pay | Admitting: *Deleted

## 2016-07-27 NOTE — Telephone Encounter (Signed)
Called pt and gave oncotype results and per Dr. Lindi Adie no chemo. Denies questions or needs at this time.

## 2016-07-27 NOTE — Telephone Encounter (Signed)
Left vm requesting return call regarding oncotype score.

## 2016-07-28 ENCOUNTER — Ambulatory Visit
Admission: RE | Admit: 2016-07-28 | Discharge: 2016-07-28 | Disposition: A | Discharge status: Home / Self Care | Payer: 59 | Source: Ambulatory Visit | Attending: General Surgery | Admitting: General Surgery

## 2016-07-28 DIAGNOSIS — C50512 Malignant neoplasm of lower-outer quadrant of left female breast: Secondary | ICD-10-CM

## 2016-07-28 MED ORDER — GADOBENATE DIMEGLUMINE 529 MG/ML IV SOLN
17.0000 mL | Freq: Once | INTRAVENOUS | Status: AC | PRN
  Administered 2016-07-28: 17 mL via INTRAVENOUS

## 2016-07-31 ENCOUNTER — Other Ambulatory Visit: Payer: Self-pay | Admitting: General Surgery

## 2016-08-07 ENCOUNTER — Encounter: Payer: Self-pay | Admitting: Hematology and Oncology

## 2016-08-07 ENCOUNTER — Encounter: Payer: Self-pay | Admitting: *Deleted

## 2016-08-07 ENCOUNTER — Ambulatory Visit (HOSPITAL_BASED_OUTPATIENT_CLINIC_OR_DEPARTMENT_OTHER): Payer: 59 | Admitting: Hematology and Oncology

## 2016-08-07 ENCOUNTER — Telehealth: Payer: Self-pay | Admitting: Hematology and Oncology

## 2016-08-07 DIAGNOSIS — Z79811 Long term (current) use of aromatase inhibitors: Secondary | ICD-10-CM

## 2016-08-07 DIAGNOSIS — Z170 Estrogen receptor positive status [ER+]: Secondary | ICD-10-CM

## 2016-08-07 DIAGNOSIS — C50512 Malignant neoplasm of lower-outer quadrant of left female breast: Secondary | ICD-10-CM

## 2016-08-07 NOTE — Progress Notes (Signed)
Patient Care Team: Kelton Pillar, MD as PCP - General (Family Medicine)  DIAGNOSIS:  Encounter Diagnosis  Name Primary?  . Malignant neoplasm of lower-outer quadrant of left breast of female, estrogen receptor positive ([place])     SUMMARY OF ONCOLOGIC HISTORY:   Malignant neoplasm of lower-outer quadrant of left breast of female, estrogen receptor positive ([place])   06/11/2016 Mammogram    Palpable left breast masses 3:00 position: 2.2 cm; 5:30 position: 2.5 cm; 6:30 position: 0.7 cm      06/19/2016 Initial Diagnosis    Left breast biopsy 3:30: IDC with DCIS grade 1, ER 90%, PR 50%, Ki-67 15%, HER-2 negative ratio 1.13; biopsy 5:30 position: IDC grade 1      07/13/2016 Breast MRI    Large area of abnormal enhancement lower inner and lower outer quadrants left breast spanning 9 cm x 6.4 cm x 5.3 cm, no abnormal enlarged lymph nodes; T3 N0 stage II a (New AJCC staging)       07/15/2016 -  Anti-estrogen oral therapy    Neoadjuvant anastrozole 1 mg daily      07/17/2016 Oncotype testing    Testing done on the biopsy: Oncotype DX score 22, intermediate risk       CHIEF COMPLIANT: Follow-up to discuss treatment plan  INTERVAL HISTORY: Kathryn Lucas is a 56 year old with above-mentioned history of large left breast cancer who had Oncotype testing on the biopsy which came back as intermediate risk. We determined that her benefit to chemotherapy is fairly small. She is currently on neoadjuvant antiestrogen therapy with anastrozole. She is tolerating it extremely well. She does have occasional hot flashes. She denies any myalgias. She was informed by Dr. Barry Dienes that she will need a mastectomy. She is very unhappy about that. She wants to try the neoadjuvant therapy approach even though it might still require mastectomy at the end of the neoadjuvant antiestrogen.   REVIEW OF SYSTEMS:   Constitutional: Denies fevers, chills or abnormal weight loss Eyes: Denies blurriness of vision Ears, nose,  mouth, throat, and face: Denies mucositis or sore throat Respiratory: Denies cough, dyspnea or wheezes Cardiovascular: Denies palpitation, chest discomfort Gastrointestinal:  Denies nausea, heartburn or change in bowel habits Skin: Denies abnormal skin rashes Lymphatics: Denies new lymphadenopathy or easy bruising Neurological:Denies numbness, tingling or new weaknesses Behavioral/Psych: Mood is stable, no new changes  Extremities: No lower extremity edema  All other systems were reviewed with the patient and are negative.  I have reviewed the past medical history, past surgical history, social history and family history with the patient and they are unchanged from previous note.  ALLERGIES:  has No Known Allergies.  MEDICATIONS:  Current Outpatient Prescriptions  Medication Sig Dispense Refill  . anastrozole (ARIMIDEX) 1 MG tablet Take 1 tablet (1 mg total) by mouth daily. 90 tablet 3  . fluticasone (FLONASE) 50 MCG/ACT nasal spray Place into the nose.    . levothyroxine (SYNTHROID, LEVOTHROID) 175 MCG tablet Take by mouth.    . meloxicam (MOBIC) 15 MG tablet Take 1 tablet (15 mg total) by mouth daily. 30 tablet 2  . SYNTHROID 125 MCG tablet TK 1 T PO ONCE D  0   No current facility-administered medications for this visit.     PHYSICAL EXAMINATION: ECOG PERFORMANCE STATUS: 1 - Symptomatic but completely ambulatory  Vitals:   08/07/16 0916  BP: 102/60  Pulse: 78  Resp: 18  Temp: 98 F (36.7 C)   Filed Weights   08/07/16 0916  Weight: 182  lb 14.4 oz (83 kg)    GENERAL:alert, no distress and comfortable SKIN: skin color, texture, turgor are normal, no rashes or significant lesions EYES: normal, Conjunctiva are pink and non-injected, sclera clear OROPHARYNX:no exudate, no erythema and lips, buccal mucosa, and tongue normal  NECK: supple, thyroid normal size, non-tender, without nodularity LYMPH:  no palpable lymphadenopathy in the cervical, axillary or inguinal LUNGS:  clear to auscultation and percussion with normal breathing effort HEART: regular rate & rhythm and no murmurs and no lower extremity edema ABDOMEN:abdomen soft, non-tender and normal bowel sounds MUSCULOSKELETAL:no cyanosis of digits and no clubbing  NEURO: alert & oriented x 3 with fluent speech, no focal motor/sensory deficits EXTREMITIES: No lower extremity edema   LABORATORY DATA:  I have reviewed the data as listed   Chemistry   No results found for: NA, K, CL, CO2, BUN, CREATININE, GLU No results found for: CALCIUM, ALKPHOS, AST, ALT, BILITOT     No results found for: WBC, HGB, HCT, MCV, PLT, NEUTROABS  ASSESSMENT & PLAN:  Malignant neoplasm of lower-outer quadrant of left breast of female, estrogen receptor positive ([place]) 06/19/2016 Left breast biopsy 3:30: IDC with DCIS grade 1, ER 90%, PR 50%, Ki-67 15%, HER-2 negative ratio 1.13; biopsy 5:30 position: IDC grade 1  Breast MRI 07/13/2016 Large area of abnormal enhancement lower inner and lower outer quadrants left breast spanning 9 cm x 6.4 cm x 5.3 cm, no abnormal enlarged lymph nodes; T3 N0 stage II a (New AJCC staging)  Oncotype DX score 22, intermediate risk, chemotherapy not felt to have significant benefit.  Treatment plan: 1. Antiestrogen therapy with anastrozole 1 mg daily started 07/15/2016 2. followed by breast conserving surgery versus mastectomy 3. Followed by adjuvant radiation 4. Followed by adjuvant antiestrogen therapy -------------------------------------------------------------------- Anastrozole toxicities: Denies any hot flashes or myalgias.  We discussed extensively about the MRI result showing the extent of her disease. I opened the scans and reviewed it with her. We discussed the mastectomy may be required even if we do neoadjuvant therapy. She is willing to go through neoadjuvant therapy with the hope that it will shrink it to a certain size that she can undergo lumpectomy. I'm not certain that we  will have that great of a response with antiestrogen therapy. However based on her wishes we are pursuing with neoadjuvant antiestrogen treatment. We will treat her for 3 months and obtain mammogram and ultrasound in August and follow-up after that. If she is responding we may continue it for 3 more months after that.  Return to clinic in 3 months with mammogram and ultrasound and follow-up.  I spent 25 minutes talking to the patient of which more than half was spent in counseling and coordination of care.  No orders of the defined types were placed in this encounter.  The patient has a good understanding of the overall plan. she agrees with it. she will call with any problems that may develop before the next visit here.   Rulon Eisenmenger, MD 08/07/16

## 2016-08-07 NOTE — Assessment & Plan Note (Signed)
06/19/2016 Left breast biopsy 3:30: IDC with DCIS grade 1, ER 90%, PR 50%, Ki-67 15%, HER-2 negative ratio 1.13; biopsy 5:30 position: IDC grade 1  Breast MRI 07/13/2016 Large area of abnormal enhancement lower inner and lower outer quadrants left breast spanning 9 cm x 6.4 cm x 5.3 cm, no abnormal enlarged lymph nodes; T3 N0 stage II a (New AJCC staging)  Oncotype DX score 22, intermediate risk, chemotherapy not felt to have significant benefit.  Treatment plan: 1. Antiestrogen therapy with anastrozole 1 mg daily started 07/15/2016 2. followed by breast conserving surgery versus mastectomy 3. Followed by adjuvant radiation 4. Followed by adjuvant antiestrogen therapy -------------------------------------------------------------------- Anastrozole toxicities:  Return to clinic in 3 months with mammogram and ultrasound and follow-up.

## 2016-08-07 NOTE — Telephone Encounter (Signed)
Gave patient avs report and appointments for August, including appointment mammo/us.

## 2016-08-12 ENCOUNTER — Ambulatory Visit (INDEPENDENT_AMBULATORY_CARE_PROVIDER_SITE_OTHER): Payer: 59

## 2016-08-12 ENCOUNTER — Ambulatory Visit (INDEPENDENT_AMBULATORY_CARE_PROVIDER_SITE_OTHER): Payer: 59 | Admitting: Podiatry

## 2016-08-12 ENCOUNTER — Encounter: Payer: Self-pay | Admitting: Podiatry

## 2016-08-12 DIAGNOSIS — S99922A Unspecified injury of left foot, initial encounter: Secondary | ICD-10-CM

## 2016-08-12 DIAGNOSIS — S93402A Sprain of unspecified ligament of left ankle, initial encounter: Secondary | ICD-10-CM

## 2016-08-12 NOTE — Progress Notes (Signed)
She presents today with chief complaint of pain to her left ankle she states that I injured it over a week ago while walking the dog and he started to feel better and then reinjured it yesterday while using the treadmill. I tried icing it and wearing shoes with heels.  Objective: Vital signs are stable she is alert and oriented 3. Pulses are palpable. No erythema and no edema cellulitis drainage or odor. She is tender to palpation of the floor sinus tarsi and of the anterior talofibular ligament area. Radiographs do not demonstrate any time by several mallet or fractures.  Assessment: Sprain ankle left.  Plan: Place her in a Tri-Lock brace. Follow up with her in 4-6 weeks.

## 2016-08-18 DIAGNOSIS — C50512 Malignant neoplasm of lower-outer quadrant of left female breast: Secondary | ICD-10-CM | POA: Diagnosis not present

## 2016-08-18 DIAGNOSIS — Z170 Estrogen receptor positive status [ER+]: Secondary | ICD-10-CM | POA: Diagnosis not present

## 2016-09-30 ENCOUNTER — Encounter: Payer: Self-pay | Admitting: Podiatry

## 2016-10-15 ENCOUNTER — Ambulatory Visit
Admission: RE | Admit: 2016-10-15 | Discharge: 2016-10-15 | Disposition: A | Discharge status: Home / Self Care | Payer: Commercial Managed Care - PPO | Source: Ambulatory Visit | Attending: Hematology and Oncology | Admitting: Hematology and Oncology

## 2016-10-15 ENCOUNTER — Telehealth: Payer: Self-pay | Admitting: Hematology and Oncology

## 2016-10-15 DIAGNOSIS — Z170 Estrogen receptor positive status [ER+]: Secondary | ICD-10-CM

## 2016-10-15 DIAGNOSIS — R918 Other nonspecific abnormal finding of lung field: Secondary | ICD-10-CM | POA: Diagnosis not present

## 2016-10-15 DIAGNOSIS — C50512 Malignant neoplasm of lower-outer quadrant of left female breast: Secondary | ICD-10-CM

## 2016-10-15 NOTE — Telephone Encounter (Signed)
lvm to inform pt of r/s 8/6 appt to 8/20 at 0915 per voicemail

## 2016-10-16 ENCOUNTER — Telehealth: Payer: Self-pay | Admitting: Hematology and Oncology

## 2016-10-16 NOTE — Telephone Encounter (Signed)
R/s appt per sch message from [place] - left message with appt date and time.

## 2016-10-19 ENCOUNTER — Ambulatory Visit: Payer: 59 | Admitting: Hematology and Oncology

## 2016-10-21 ENCOUNTER — Ambulatory Visit
Admission: RE | Admit: 2016-10-21 | Discharge: 2016-10-21 | Disposition: A | Discharge status: Home / Self Care | Payer: Commercial Managed Care - PPO | Source: Ambulatory Visit | Attending: Hematology and Oncology | Admitting: Hematology and Oncology

## 2016-10-21 DIAGNOSIS — N6489 Other specified disorders of breast: Secondary | ICD-10-CM | POA: Diagnosis not present

## 2016-11-02 ENCOUNTER — Ambulatory Visit: Payer: Commercial Managed Care - PPO | Admitting: Hematology and Oncology

## 2016-11-05 NOTE — Assessment & Plan Note (Signed)
06/19/2016 Left breast biopsy 3:30: IDC with DCIS grade 1, ER 90%, PR 50%, Ki-67 15%, HER-2 negative ratio 1.13; biopsy 5:30 position: IDC grade 1  Breast MRI 07/13/2016 Large area of abnormal enhancement lower inner and lower outer quadrants left breast spanning 9 cm x 6.4 cm x 5.3 cm, no abnormal enlarged lymph nodes; T3 N0 stage II a (New AJCC staging)  Oncotype DX score 22, intermediate risk, chemotherapy not felt to have significant benefit.  Treatment plan: 1. Antiestrogen therapy with anastrozole 1 mg daily started 07/15/2016 2. followed by breast conserving surgery versus mastectomy 3. Followed by adjuvant radiation 4. Followed by adjuvant antiestrogen therapy -------------------------------------------------------------------- Anastrozole toxicities: Denies any hot flashes or myalgias.  Mammogram and ultrasound 10/21/16: Mild decrease in size of the 3 masses 2.2 cm to 1.8 cm; 2.5 cm to 2.1 cm; 0.7 cm to 0.5 cm  Continue 3 more months of Neoadj Anastrozole and then do another scan.

## 2016-11-06 ENCOUNTER — Ambulatory Visit (HOSPITAL_BASED_OUTPATIENT_CLINIC_OR_DEPARTMENT_OTHER): Payer: Commercial Managed Care - PPO | Admitting: Hematology and Oncology

## 2016-11-06 ENCOUNTER — Telehealth: Payer: Self-pay

## 2016-11-06 ENCOUNTER — Encounter: Payer: Self-pay | Admitting: Hematology and Oncology

## 2016-11-06 DIAGNOSIS — C50512 Malignant neoplasm of lower-outer quadrant of left female breast: Secondary | ICD-10-CM

## 2016-11-06 DIAGNOSIS — Z79811 Long term (current) use of aromatase inhibitors: Secondary | ICD-10-CM

## 2016-11-06 DIAGNOSIS — Z170 Estrogen receptor positive status [ER+]: Secondary | ICD-10-CM

## 2016-11-06 NOTE — Progress Notes (Signed)
Patient Care Team: Kelton Pillar, MD as PCP - General (Family Medicine)  DIAGNOSIS:  Encounter Diagnosis  Name Primary?  . Malignant neoplasm of lower-outer quadrant of left breast of female, estrogen receptor positive ([place])     SUMMARY OF ONCOLOGIC HISTORY:   Malignant neoplasm of lower-outer quadrant of left breast of female, estrogen receptor positive ([place])   06/11/2016 Mammogram    Palpable left breast masses 3:00 position: 2.2 cm; 5:30 position: 2.5 cm; 6:30 position: 0.7 cm      06/19/2016 Initial Diagnosis    Left breast biopsy 3:30: IDC with DCIS grade 1, ER 90%, PR 50%, Ki-67 15%, HER-2 negative ratio 1.13; biopsy 5:30 position: IDC grade 1      07/13/2016 Breast MRI    Large area of abnormal enhancement lower inner and lower outer quadrants left breast spanning 9 cm x 6.4 cm x 5.3 cm, no abnormal enlarged lymph nodes; T3 N0 stage II a (New AJCC staging)       07/15/2016 -  Anti-estrogen oral therapy    Neoadjuvant anastrozole 1 mg daily      07/17/2016 Oncotype testing    Testing done on the biopsy: Oncotype DX score 22, intermediate risk       CHIEF COMPLIANT: Neoadjuvant Arimidex therapy  INTERVAL HISTORY: Kathryn Lucas is a 56 year old with above-mentioned history left breast cancer with the multifocal disease who is currently on neoadjuvant antiestrogen therapy with anastrozole. She is tolerating anastrozole extremely well. She works at [employer] at [employer] in the [employer]. She does not want to undergo lumpectomy. She would prefer to remain on antiestrogen therapy long-term. She understands fully well that antiestrogen therapy cannot cure her breast cancer.  REVIEW OF SYSTEMS:   Constitutional: Denies fevers, chills or abnormal weight loss Eyes: Denies blurriness of vision Ears, nose, mouth, throat, and face: Denies mucositis or sore throat Respiratory: Denies cough, dyspnea or wheezes Cardiovascular: Denies palpitation, chest  discomfort Gastrointestinal:  Denies nausea, heartburn or change in bowel habits Skin: Denies abnormal skin rashes Lymphatics: Denies new lymphadenopathy or easy bruising Neurological:Denies numbness, tingling or new weaknesses Behavioral/Psych: Mood is stable, no new changes  Extremities: No lower extremity edema  All other systems were reviewed with the patient and are negative.  I have reviewed the past medical history, past surgical history, social history and family history with the patient and they are unchanged from previous note.  ALLERGIES:  has No Known Allergies.  MEDICATIONS:  Current Outpatient Prescriptions  Medication Sig Dispense Refill  . anastrozole (ARIMIDEX) 1 MG tablet Take 1 tablet (1 mg total) by mouth daily. 90 tablet 3  . fluticasone (FLONASE) 50 MCG/ACT nasal spray Place into the nose.    . levothyroxine (SYNTHROID, LEVOTHROID) 175 MCG tablet Take by mouth.    . meloxicam (MOBIC) 15 MG tablet Take 1 tablet (15 mg total) by mouth daily. 30 tablet 2  . SYNTHROID 125 MCG tablet TK 1 T PO ONCE D  0   No current facility-administered medications for this visit.     PHYSICAL EXAMINATION: ECOG PERFORMANCE STATUS: 1 - Symptomatic but completely ambulatory  Vitals:   11/06/16 0818  BP: (!) 118/58  Pulse: 64  Resp: 18  Temp: (!) 97.5 F (36.4 C)  SpO2: 100%   Filed Weights   11/06/16 0818  Weight: 184 lb 1.6 oz (83.5 kg)    GENERAL:alert, no distress and comfortable SKIN: skin color, texture, turgor are normal, no rashes or significant lesions EYES: normal, Conjunctiva  are pink and non-injected, sclera clear OROPHARYNX:no exudate, no erythema and lips, buccal mucosa, and tongue normal  NECK: supple, thyroid normal size, non-tender, without nodularity LYMPH:  no palpable lymphadenopathy in the cervical, axillary or inguinal LUNGS: clear to auscultation and percussion with normal breathing effort HEART: regular rate & rhythm and no murmurs and no lower  extremity edema ABDOMEN:abdomen soft, non-tender and normal bowel sounds MUSCULOSKELETAL:no cyanosis of digits and no clubbing  NEURO: alert & oriented x 3 with fluent speech, no focal motor/sensory deficits EXTREMITIES: No lower extremity edema  LABORATORY DATA:  I have reviewed the data as listed   Chemistry   No results found for: NA, K, CL, CO2, BUN, CREATININE, GLU No results found for: CALCIUM, ALKPHOS, AST, ALT, BILITOT     No results found for: WBC, HGB, HCT, MCV, PLT, NEUTROABS  ASSESSMENT & PLAN:  Malignant neoplasm of lower-outer quadrant of left breast of female, estrogen receptor positive ([facility]) 06/19/2016 Left breast biopsy 3:30: IDC with DCIS grade 1, ER 90%, PR 50%, Ki-67 15%, HER-2 negative ratio 1.13; biopsy 5:30 position: IDC grade 1  Breast MRI 07/13/2016 Large area of abnormal enhancement lower inner and lower outer quadrants left breast spanning 9 cm x 6.4 cm x 5.3 cm, no abnormal enlarged lymph nodes; T3 N0 stage II a (New AJCC staging)  Oncotype DX score 22, intermediate risk, chemotherapy not felt to have significant benefit.  Treatment plan: 1. Antiestrogen therapy with anastrozole 1 mg daily started 07/15/2016 2. followed by breast conserving surgery versus mastectomy 3. Followed by adjuvant radiation 4. Followed by adjuvant antiestrogen therapy -------------------------------------------------------------------- Anastrozole toxicities: Denies any hot flashes or myalgias.  Mammogram and ultrasound 10/21/16: Mild decrease in size of the 3 masses 2.2 cm to 1.8 cm; 2.5 cm to 2.1 cm; 0.7 cm to 0.5 cm  Continue 3 more months of Neoadj Anastrozole and then do Breast MRI. Patient was very emphatic in that she does not want to undergo any surgery on the breast. It may be because she sees a lot of women who have had prior breast surgeries (because she works at [employer] at [employer]) and this may have influenced her emotionally.   I spent 25 minutes talking to  the patient of which more than half was spent in counseling and coordination of care.  Orders Placed This Encounter  Procedures  . MR BREAST BILATERAL W WO CONTRAST    Standing Status:   Future    Standing Expiration Date:   01/06/2018    Order Specific Question:   If indicated for the ordered procedure, I authorize the administration of contrast media per Radiology protocol    Answer:   Yes    Order Specific Question:   What is the patient's sedation requirement?    Answer:   No Sedation    Order Specific Question:   Does the patient have a pacemaker or implanted devices?    Answer:   No    Order Specific Question:   Radiology Contrast Protocol - do NOT remove file path    Answer:   [email or web address]    Order Specific Question:   Reason for Exam additional comments    Answer:   Neoadjuvant anti estrogen therapy for Breast cancer    Order Specific Question:   Preferred imaging location?    Answer:   [facility] (table limit-350 lbs)   The patient has a good understanding of the overall plan. she agrees with it. she will call with any problems   that may develop before the next visit here.   Gudena, Vinay K, MD 11/06/16    

## 2016-11-06 NOTE — Telephone Encounter (Signed)
appts made and avs printed for patient per 11/06/16

## 2016-11-18 DIAGNOSIS — H6982 Other specified disorders of Eustachian tube, left ear: Secondary | ICD-10-CM | POA: Diagnosis not present

## 2016-12-21 DIAGNOSIS — M9903 Segmental and somatic dysfunction of lumbar region: Secondary | ICD-10-CM | POA: Diagnosis not present

## 2016-12-21 DIAGNOSIS — M9905 Segmental and somatic dysfunction of pelvic region: Secondary | ICD-10-CM | POA: Diagnosis not present

## 2016-12-21 DIAGNOSIS — M5416 Radiculopathy, lumbar region: Secondary | ICD-10-CM | POA: Diagnosis not present

## 2016-12-23 DIAGNOSIS — M9903 Segmental and somatic dysfunction of lumbar region: Secondary | ICD-10-CM | POA: Diagnosis not present

## 2016-12-23 DIAGNOSIS — M9905 Segmental and somatic dysfunction of pelvic region: Secondary | ICD-10-CM | POA: Diagnosis not present

## 2016-12-23 DIAGNOSIS — M5416 Radiculopathy, lumbar region: Secondary | ICD-10-CM | POA: Diagnosis not present

## 2016-12-29 DIAGNOSIS — M5416 Radiculopathy, lumbar region: Secondary | ICD-10-CM | POA: Diagnosis not present

## 2016-12-29 DIAGNOSIS — M9903 Segmental and somatic dysfunction of lumbar region: Secondary | ICD-10-CM | POA: Diagnosis not present

## 2016-12-29 DIAGNOSIS — M9905 Segmental and somatic dysfunction of pelvic region: Secondary | ICD-10-CM | POA: Diagnosis not present

## 2016-12-30 ENCOUNTER — Telehealth: Payer: Self-pay | Admitting: *Deleted

## 2016-12-30 NOTE — Telephone Encounter (Signed)
lvm on provided number with MRI appt date/time

## 2016-12-30 NOTE — Telephone Encounter (Signed)
"  I need to leave a message for [facility].  When is my MRI scheduled for November?  Call me back with the date [phone]."  Routing call information to collaborative nurse and provider for review.  Further patient communication through collaborative nurse.

## 2017-01-04 ENCOUNTER — Telehealth: Payer: Self-pay

## 2017-01-04 NOTE — Telephone Encounter (Signed)
Printed avs and calender for upcoming appointment. Per 10/22 los 

## 2017-01-04 NOTE — Telephone Encounter (Signed)
Called patient concerning appointment time date change for 20/12. Per los sch message.

## 2017-02-02 ENCOUNTER — Ambulatory Visit (HOSPITAL_COMMUNITY)
Admission: RE | Admit: 2017-02-02 | Discharge: 2017-02-02 | Disposition: A | Discharge status: Home / Self Care | Payer: Commercial Managed Care - PPO | Source: Ambulatory Visit | Attending: Hematology and Oncology | Admitting: Hematology and Oncology

## 2017-02-02 DIAGNOSIS — Z170 Estrogen receptor positive status [ER+]: Secondary | ICD-10-CM | POA: Diagnosis not present

## 2017-02-02 DIAGNOSIS — C50512 Malignant neoplasm of lower-outer quadrant of left female breast: Secondary | ICD-10-CM | POA: Diagnosis not present

## 2017-02-02 DIAGNOSIS — C50919 Malignant neoplasm of unspecified site of unspecified female breast: Secondary | ICD-10-CM | POA: Diagnosis not present

## 2017-02-02 MED ORDER — GADOBENATE DIMEGLUMINE 529 MG/ML IV SOLN
20.0000 mL | Freq: Once | INTRAVENOUS | Status: AC | PRN
  Administered 2017-02-02: 17 mL via INTRAVENOUS

## 2017-02-03 ENCOUNTER — Other Ambulatory Visit: Payer: Self-pay | Admitting: *Deleted

## 2017-02-03 DIAGNOSIS — M9905 Segmental and somatic dysfunction of pelvic region: Secondary | ICD-10-CM | POA: Diagnosis not present

## 2017-02-03 DIAGNOSIS — N6310 Unspecified lump in the right breast, unspecified quadrant: Secondary | ICD-10-CM

## 2017-02-03 DIAGNOSIS — M5416 Radiculopathy, lumbar region: Secondary | ICD-10-CM | POA: Diagnosis not present

## 2017-02-03 DIAGNOSIS — M9903 Segmental and somatic dysfunction of lumbar region: Secondary | ICD-10-CM | POA: Diagnosis not present

## 2017-02-09 ENCOUNTER — Telehealth: Payer: Self-pay | Admitting: *Deleted

## 2017-02-09 ENCOUNTER — Ambulatory Visit
Admission: RE | Admit: 2017-02-09 | Discharge: 2017-02-09 | Disposition: A | Discharge status: Home / Self Care | Payer: Commercial Managed Care - PPO | Source: Ambulatory Visit | Attending: Hematology and Oncology | Admitting: Hematology and Oncology

## 2017-02-09 DIAGNOSIS — N6310 Unspecified lump in the right breast, unspecified quadrant: Secondary | ICD-10-CM

## 2017-02-09 DIAGNOSIS — N6489 Other specified disorders of breast: Secondary | ICD-10-CM | POA: Diagnosis not present

## 2017-02-09 NOTE — Telephone Encounter (Signed)
Received call from patient stating she can not make her appointment on 11/29.  She can only do 12/4 because that is her day off.  Appointment confirmed for 12/4 for 815am.

## 2017-02-11 ENCOUNTER — Ambulatory Visit: Payer: Commercial Managed Care - PPO | Admitting: Hematology and Oncology

## 2017-02-16 ENCOUNTER — Telehealth: Payer: Self-pay | Admitting: Hematology and Oncology

## 2017-02-16 ENCOUNTER — Ambulatory Visit (HOSPITAL_BASED_OUTPATIENT_CLINIC_OR_DEPARTMENT_OTHER): Payer: Commercial Managed Care - PPO | Admitting: Hematology and Oncology

## 2017-02-16 DIAGNOSIS — Z79811 Long term (current) use of aromatase inhibitors: Secondary | ICD-10-CM

## 2017-02-16 DIAGNOSIS — C50512 Malignant neoplasm of lower-outer quadrant of left female breast: Secondary | ICD-10-CM | POA: Diagnosis not present

## 2017-02-16 DIAGNOSIS — Z170 Estrogen receptor positive status [ER+]: Secondary | ICD-10-CM

## 2017-02-16 NOTE — Assessment & Plan Note (Signed)
06/19/2016 Left breast biopsy 3:30: IDC with DCIS grade 1, ER 90%, PR 50%, Ki-67 15%, HER-2 negative ratio 1.13; biopsy 5:30 position: IDC grade 1  Breast MRI 07/13/2016 Large area of abnormal enhancement lower inner and lower outer quadrants left breast spanning 9 cm x 6.4 cm x 5.3 cm, no abnormal enlarged lymph nodes; T3 N0 stage II a (New AJCC staging)  Oncotype DXscore 22, intermediate risk, chemotherapy not felt to have significant benefit.  Treatment plan: 1. Antiestrogen therapy with anastrozole 1 mg daily started 07/15/2016 2. followed by breast conserving surgery versus mastectomy 3. Followed by adjuvant radiation 4. Followed by adjuvant antiestrogen therapy -------------------------------------------------------------------- Anastrozole toxicities: Denies any hot flashes or myalgias.  Breast MRI showed unchanged multifocal lesion in the left breast, mass or non-mass enhancement measuring 6.2 cm, 2 satellite nodules 8 mm. Lesion right breast measuring 8 mm without any ultrasound correlate.  Recommendation: 1.  Given the lack of sufficient response to antiestrogen therapy, I recommended mastectomy on the left consideration for either a biopsy or resection on the right. 2. however patient is not interested in surgery.  It may be because she sees a lot of women who have had prior breast surgeries (because she works at [employer] at [employer]) and this may have influenced her emotionally.   I discussed with her that if not appropriately treated her disease could metastasize and could take her life  She understands these and wishes to remain on antiestrogen therapy alone.  Return to clinic in 6 months with another mammogram and ultrasound

## 2017-02-16 NOTE — Telephone Encounter (Signed)
Gave patient avs with appts per 12/4 los.

## 2017-02-16 NOTE — Progress Notes (Signed)
Patient Care Team: Kelton Pillar, MD as PCP - General (Family Medicine)  DIAGNOSIS:  Encounter Diagnosis  Name Primary?  . Malignant neoplasm of lower-outer quadrant of left breast of female, estrogen receptor positive ([place])     SUMMARY OF ONCOLOGIC HISTORY:   Malignant neoplasm of lower-outer quadrant of left breast of female, estrogen receptor positive ([place])   06/11/2016 Mammogram    Palpable left breast masses 3:00 position: 2.2 cm; 5:30 position: 2.5 cm; 6:30 position: 0.7 cm      06/19/2016 Initial Diagnosis    Left breast biopsy 3:30: IDC with DCIS grade 1, ER 90%, PR 50%, Ki-67 15%, HER-2 negative ratio 1.13; biopsy 5:30 position: IDC grade 1      07/13/2016 Breast MRI    Large area of abnormal enhancement lower inner and lower outer quadrants left breast spanning 9 cm x 6.4 cm x 5.3 cm, no abnormal enlarged lymph nodes; T3 N0 stage II a (New AJCC staging)       07/15/2016 -  Anti-estrogen oral therapy    Neoadjuvant anastrozole 1 mg daily      07/17/2016 Oncotype testing    Testing done on the biopsy: Oncotype DX score 22, intermediate risk      02/02/2017 Breast MRI    Left breast multicentric disease unchanged measuring 2.7 x 1.6 cm.  Mass in the non-mass enhancement are also not significantly changed measuring 6.2 x 2.4 cm. new enhancing mass within the outer right breast 7 mm which could be fat necrosis or inclusion cyst       02/09/2017 Imaging    Ultrasound of the right breast lesion noted on MRI: No sonographic finding corresponds to the abnormality noted on MRI       CHIEF COMPLIANT: Follow-up to discuss recently performed MRI and ultrasound  INTERVAL HISTORY: Kathryn Lucas is a 56 year old who is currently neoadjuvant antiestrogen therapy for left multifocal breast cancer.  She is currently on Arimidex therapy and tolerating it very well.  She reports no hot flashes or myalgias.  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  REVIEW OF SYSTEMS:   Constitutional: Denies fevers, chills or abnormal weight loss Eyes: Denies blurriness of vision Ears, nose, mouth, throat, and face: Denies mucositis or sore throat Respiratory: Denies cough, dyspnea or wheezes Cardiovascular: Denies palpitation, chest discomfort Gastrointestinal:  Denies nausea, heartburn or change in bowel habits Skin: Denies abnormal skin rashes Lymphatics: Denies new lymphadenopathy or easy bruising Neurological:Denies numbness, tingling or new weaknesses Behavioral/Psych: Mood is stable, no new changes  Extremities: No lower extremity edema Breast:  denies any pain or lumps or nodules in either breasts All other systems were reviewed with the patient and are negative.  I have reviewed the past medical history, past surgical history, social history and family history with the patient and they are unchanged from previous note.  ALLERGIES:  has No Known Allergies.  MEDICATIONS:  Current Outpatient Medications  Medication Sig Dispense Refill  . anastrozole (ARIMIDEX) 1 MG tablet Take 1 tablet (1 mg total) by mouth daily. 90 tablet 3  . fluticasone (FLONASE) 50 MCG/ACT nasal spray Place into the nose.    . levothyroxine (SYNTHROID, LEVOTHROID) 175 MCG tablet Take by mouth.    . meloxicam (MOBIC) 15 MG tablet Take 1 tablet (15 mg total) by mouth daily. 30 tablet 2  . SYNTHROID 125 MCG tablet TK 1 T PO ONCE D  0  No current facility-administered medications for this visit.     PHYSICAL EXAMINATION: ECOG PERFORMANCE STATUS: 0 - Asymptomatic  Vitals:   02/16/17 0817  BP: 111/85  Pulse: 66  Resp: 18  Temp: 98 F (36.7 C)  SpO2: 100%   Filed Weights   02/16/17 0817  Weight: 187 lb (84.8 kg)    GENERAL:alert, no distress and comfortable SKIN: skin color, texture, turgor are normal, no rashes or significant lesions EYES: normal, Conjunctiva are pink and non-injected, sclera clear OROPHARYNX:no exudate, no  erythema and lips, buccal mucosa, and tongue normal  NECK: supple, thyroid normal size, non-tender, without nodularity LYMPH:  no palpable lymphadenopathy in the cervical, axillary or inguinal LUNGS: clear to auscultation and percussion with normal breathing effort HEART: regular rate & rhythm and no murmurs and no lower extremity edema ABDOMEN:abdomen soft, non-tender and normal bowel sounds MUSCULOSKELETAL:no cyanosis of digits and no clubbing  NEURO: alert & oriented x 3 with fluent speech, no focal motor/sensory deficits EXTREMITIES: No lower extremity edema  ASSESSMENT & PLAN:  Malignant neoplasm of lower-outer quadrant of left breast of female, estrogen receptor positive ([place]) 06/19/2016 Left breast biopsy 3:30: IDC with DCIS grade 1, ER 90%, PR 50%, Ki-67 15%, HER-2 negative ratio 1.13; biopsy 5:30 position: IDC grade 1  Breast MRI 07/13/2016 Large area of abnormal enhancement lower inner and lower outer quadrants left breast spanning 9 cm x 6.4 cm x 5.3 cm, no abnormal enlarged lymph nodes; T3 N0 stage II a (New AJCC staging)  Oncotype DXscore 22, intermediate risk, chemotherapy not felt to have significant benefit.  Treatment plan: 1. Antiestrogen therapy with anastrozole 1 mg daily started 07/15/2016 2. followed by breast conserving surgery versus mastectomy 3. Followed by adjuvant radiation 4. Followed by adjuvant antiestrogen therapy -------------------------------------------------------------------- Anastrozole toxicities: Denies any hot flashes or myalgias.  Breast MRI showed unchanged multifocal lesion in the left breast, mass or non-mass enhancement measuring 6.2 cm, 2 satellite nodules 8 mm. Lesion right breast measuring 8 mm without any ultrasound correlate.  Recommendation: 1.  Given the lack of sufficient response to antiestrogen therapy, I recommended mastectomy on the left consideration for either a biopsy or resection on the right. 2. however patient is not  interested in surgery.  It may be because she sees a lot of women who have had prior breast surgeries (because she works at [employer] at [employer]) and this may have influenced her emotionally.   I discussed with her that if not appropriately treated her disease could metastasize and could take her life.  She informed me that if she could get a lumpectomy she would be more open to undergoing surgery.  At the current stage, she cannot undergo lumpectomy.  She understands these and wishes to remain on antiestrogen therapy alone at this time.  She may be willing to consider surgery down the line.  Return to clinic in 6 months with another MRI   I spent 25 minutes talking to the patient of which more than half was spent in counseling and coordination of care.  Orders Placed This Encounter  Procedures  . MR BREAST BILATERAL W WO CONTRAST    Standing Status:   Future    Standing Expiration Date:   04/19/2018    Order Specific Question:   If indicated for the ordered procedure, I authorize the administration of contrast media per Radiology protocol    Answer:   Yes    Order Specific Question:   What is the patient's sedation requirement?  Answer:   No Sedation    Order Specific Question:   Does the patient have a pacemaker or implanted devices?    Answer:   No    Order Specific Question:   Radiology Contrast Protocol - do NOT remove file path    Answer:   [email or web address]    Order Specific Question:   Reason for Exam additional comments    Answer:   Neoadjuvant hormonal therapy    Order Specific Question:   Preferred imaging location?    Answer:   [facility] (table limit-350 lbs)   The patient has a good understanding of the overall plan. she agrees with it. she will call with any problems that may develop before the next visit here.   Rulon Eisenmenger, MD 02/16/17

## 2017-03-02 DIAGNOSIS — M5416 Radiculopathy, lumbar region: Secondary | ICD-10-CM | POA: Diagnosis not present

## 2017-03-02 DIAGNOSIS — M9903 Segmental and somatic dysfunction of lumbar region: Secondary | ICD-10-CM | POA: Diagnosis not present

## 2017-03-02 DIAGNOSIS — M9905 Segmental and somatic dysfunction of pelvic region: Secondary | ICD-10-CM | POA: Diagnosis not present

## 2017-03-16 DIAGNOSIS — Z923 Personal history of irradiation: Secondary | ICD-10-CM

## 2017-03-16 HISTORY — DX: Personal history of irradiation: Z92.3

## 2017-03-16 HISTORY — PX: MASTECTOMY: SHX3

## 2017-03-16 HISTORY — PX: BREAST BIOPSY: SHX20

## 2017-04-06 DIAGNOSIS — M9903 Segmental and somatic dysfunction of lumbar region: Secondary | ICD-10-CM | POA: Diagnosis not present

## 2017-04-06 DIAGNOSIS — M9905 Segmental and somatic dysfunction of pelvic region: Secondary | ICD-10-CM | POA: Diagnosis not present

## 2017-04-06 DIAGNOSIS — M5416 Radiculopathy, lumbar region: Secondary | ICD-10-CM | POA: Diagnosis not present

## 2017-05-11 DIAGNOSIS — M9905 Segmental and somatic dysfunction of pelvic region: Secondary | ICD-10-CM | POA: Diagnosis not present

## 2017-05-11 DIAGNOSIS — M9903 Segmental and somatic dysfunction of lumbar region: Secondary | ICD-10-CM | POA: Diagnosis not present

## 2017-05-11 DIAGNOSIS — M5416 Radiculopathy, lumbar region: Secondary | ICD-10-CM | POA: Diagnosis not present

## 2017-05-18 DIAGNOSIS — Z23 Encounter for immunization: Secondary | ICD-10-CM | POA: Diagnosis not present

## 2017-05-18 DIAGNOSIS — Z0000 Encounter for general adult medical examination without abnormal findings: Secondary | ICD-10-CM | POA: Diagnosis not present

## 2017-05-18 DIAGNOSIS — Z136 Encounter for screening for cardiovascular disorders: Secondary | ICD-10-CM | POA: Diagnosis not present

## 2017-05-18 DIAGNOSIS — Z131 Encounter for screening for diabetes mellitus: Secondary | ICD-10-CM | POA: Diagnosis not present

## 2017-05-18 DIAGNOSIS — J309 Allergic rhinitis, unspecified: Secondary | ICD-10-CM | POA: Diagnosis not present

## 2017-05-18 DIAGNOSIS — E039 Hypothyroidism, unspecified: Secondary | ICD-10-CM | POA: Diagnosis not present

## 2017-07-14 ENCOUNTER — Other Ambulatory Visit: Payer: Self-pay | Admitting: Hematology and Oncology

## 2017-07-14 ENCOUNTER — Telehealth: Payer: Self-pay

## 2017-07-14 DIAGNOSIS — Z853 Personal history of malignant neoplasm of breast: Secondary | ICD-10-CM

## 2017-07-14 NOTE — Telephone Encounter (Signed)
See prev note

## 2017-07-14 NOTE — Telephone Encounter (Signed)
Returned pt call and LVM regarding next appointment. Informed pt of her appointment in June and that she would need to have her breast MR prior to this appt. Number for scheduling provided. She is to call with any further questions.  Cyndia Bent RN

## 2017-07-21 ENCOUNTER — Ambulatory Visit
Admission: RE | Admit: 2017-07-21 | Discharge: 2017-07-21 | Disposition: A | Discharge status: Home / Self Care | Payer: Commercial Managed Care - PPO | Source: Ambulatory Visit | Attending: Hematology and Oncology | Admitting: Hematology and Oncology

## 2017-07-21 ENCOUNTER — Other Ambulatory Visit: Payer: Self-pay | Admitting: Hematology and Oncology

## 2017-07-21 DIAGNOSIS — Z853 Personal history of malignant neoplasm of breast: Secondary | ICD-10-CM

## 2017-07-21 DIAGNOSIS — N6321 Unspecified lump in the left breast, upper outer quadrant: Secondary | ICD-10-CM | POA: Diagnosis not present

## 2017-07-21 DIAGNOSIS — N6320 Unspecified lump in the left breast, unspecified quadrant: Secondary | ICD-10-CM

## 2017-07-21 DIAGNOSIS — N6323 Unspecified lump in the left breast, lower outer quadrant: Secondary | ICD-10-CM | POA: Diagnosis not present

## 2017-07-21 DIAGNOSIS — R928 Other abnormal and inconclusive findings on diagnostic imaging of breast: Secondary | ICD-10-CM | POA: Diagnosis not present

## 2017-08-03 ENCOUNTER — Ambulatory Visit (HOSPITAL_COMMUNITY): Payer: Commercial Managed Care - PPO

## 2017-08-10 ENCOUNTER — Ambulatory Visit (HOSPITAL_COMMUNITY): Admission: RE | Admit: 2017-08-10 | Payer: Commercial Managed Care - PPO | Source: Ambulatory Visit

## 2017-08-10 ENCOUNTER — Ambulatory Visit (HOSPITAL_COMMUNITY)
Admission: RE | Admit: 2017-08-10 | Discharge: 2017-08-10 | Disposition: A | Discharge status: Home / Self Care | Payer: Commercial Managed Care - PPO | Source: Ambulatory Visit | Attending: Hematology and Oncology | Admitting: Hematology and Oncology

## 2017-08-10 DIAGNOSIS — C50512 Malignant neoplasm of lower-outer quadrant of left female breast: Secondary | ICD-10-CM | POA: Diagnosis present

## 2017-08-10 DIAGNOSIS — Z170 Estrogen receptor positive status [ER+]: Secondary | ICD-10-CM | POA: Diagnosis not present

## 2017-08-10 DIAGNOSIS — Z853 Personal history of malignant neoplasm of breast: Secondary | ICD-10-CM | POA: Diagnosis not present

## 2017-08-10 MED ORDER — GADOBENATE DIMEGLUMINE 529 MG/ML IV SOLN
20.0000 mL | Freq: Once | INTRAVENOUS | Status: AC | PRN
  Administered 2017-08-10: 17 mL via INTRAVENOUS

## 2017-08-17 ENCOUNTER — Telehealth: Payer: Self-pay | Admitting: *Deleted

## 2017-08-17 ENCOUNTER — Other Ambulatory Visit: Payer: Self-pay | Admitting: *Deleted

## 2017-08-17 ENCOUNTER — Inpatient Hospital Stay: Payer: Commercial Managed Care - PPO | Attending: Hematology and Oncology | Admitting: Hematology and Oncology

## 2017-08-17 DIAGNOSIS — Z170 Estrogen receptor positive status [ER+]: Secondary | ICD-10-CM

## 2017-08-17 DIAGNOSIS — Z79811 Long term (current) use of aromatase inhibitors: Secondary | ICD-10-CM | POA: Insufficient documentation

## 2017-08-17 DIAGNOSIS — R928 Other abnormal and inconclusive findings on diagnostic imaging of breast: Secondary | ICD-10-CM

## 2017-08-17 DIAGNOSIS — C50512 Malignant neoplasm of lower-outer quadrant of left female breast: Secondary | ICD-10-CM

## 2017-08-17 NOTE — Progress Notes (Signed)
Error

## 2017-08-17 NOTE — Progress Notes (Signed)
Patient Care Team: Kelton Pillar, MD as PCP - General (Family Medicine)  DIAGNOSIS:  Encounter Diagnosis  Name Primary?  . Malignant neoplasm of lower-outer quadrant of left breast of female, estrogen receptor positive ([place])     SUMMARY OF ONCOLOGIC HISTORY:   Malignant neoplasm of lower-outer quadrant of left breast of female, estrogen receptor positive ([place])   06/11/2016 Mammogram    Palpable left breast masses 3:00 position: 2.2 cm; 5:30 position: 2.5 cm; 6:30 position: 0.7 cm      06/19/2016 Initial Diagnosis    Left breast biopsy 3:30: IDC with DCIS grade 1, ER 90%, PR 50%, Ki-67 15%, HER-2 negative ratio 1.13; biopsy 5:30 position: IDC grade 1      07/13/2016 Breast MRI    Large area of abnormal enhancement lower inner and lower outer quadrants left breast spanning 9 cm x 6.4 cm x 5.3 cm, no abnormal enlarged lymph nodes; T3 N0 stage II a (New AJCC staging)       07/15/2016 -  Anti-estrogen oral therapy    Neoadjuvant anastrozole 1 mg daily      07/17/2016 Oncotype testing    Testing done on the biopsy: Oncotype DX score 22, intermediate risk      02/02/2017 Breast MRI    Left breast multicentric disease unchanged measuring 2.7 x 1.6 cm.  Mass in the non-mass enhancement are also not significantly changed measuring 6.2 x 2.4 cm. new enhancing mass within the outer right breast 7 mm which could be fat necrosis or inclusion cyst       02/09/2017 Imaging    Ultrasound of the right breast lesion noted on MRI: No sonographic finding corresponds to the abnormality noted on MRI       CHIEF COMPLIANT: Follow-up to discuss results of recent breast MRI  INTERVAL HISTORY: Kathryn Lucas is a 71-year with above-mentioned history of large left breast multicentric disease who has been on oral antiestrogen therapy with anastrozole.  She has completed about 1 year of treatment.  6 months ago we recommended that she needs to have surgery but she did not want to do it at that time.   She is here for discussing the results of the recently concluded breast MRI after taking 6 months of antiestrogen therapy.  It appears that the tumor may have slightly progressed and there is suspicious lymph node in the axilla as well.  She did not have any major side effects or anastrozole therapy.  She denies any hot flashes or myalgias.  REVIEW OF SYSTEMS:   Constitutional: Denies fevers, chills or abnormal weight loss Eyes: Denies blurriness of vision Ears, nose, mouth, throat, and face: Denies mucositis or sore throat Respiratory: Denies cough, dyspnea or wheezes Cardiovascular: Denies palpitation, chest discomfort Gastrointestinal:  Denies nausea, heartburn or change in bowel habits Skin: Denies abnormal skin rashes Lymphatics: Denies new lymphadenopathy or easy bruising Neurological:Denies numbness, tingling or new weaknesses Behavioral/Psych: Mood is stable, no new changes  Extremities: No lower extremity edema  All other systems were reviewed with the patient and are negative.  I have reviewed the past medical history, past surgical history, social history and family history with the patient and they are unchanged from previous note.  ALLERGIES:  has No Known Allergies.  MEDICATIONS:  Current Outpatient Medications  Medication Sig Dispense Refill  . anastrozole (ARIMIDEX) 1 MG tablet Take 1 tablet (1 mg total) by mouth daily. 90 tablet 3  . fluticasone (FLONASE) 50 MCG/ACT nasal spray Place into the nose.    Marland Kitchen  levothyroxine (SYNTHROID, LEVOTHROID) 175 MCG tablet Take by mouth.    . meloxicam (MOBIC) 15 MG tablet Take 1 tablet (15 mg total) by mouth daily. 30 tablet 2  . SYNTHROID 125 MCG tablet TK 1 T PO ONCE D  0   No current facility-administered medications for this visit.     PHYSICAL EXAMINATION: ECOG PERFORMANCE STATUS: 1 - Symptomatic but completely ambulatory  Vitals:   08/17/17 0825  BP: 133/71  Pulse: 64  Resp: 17  Temp: 97.9 F (36.6 C)  SpO2: 100%    Filed Weights   08/17/17 0825  Weight: 184 lb 4.8 oz (83.6 kg)    GENERAL:alert, no distress and comfortable SKIN: skin color, texture, turgor are normal, no rashes or significant lesions EYES: normal, Conjunctiva are pink and non-injected, sclera clear OROPHARYNX:no exudate, no erythema and lips, buccal mucosa, and tongue normal  NECK: supple, thyroid normal size, non-tender, without nodularity LYMPH:  no palpable lymphadenopathy in the cervical, axillary or inguinal LUNGS: clear to auscultation and percussion with normal breathing effort HEART: regular rate & rhythm and no murmurs and no lower extremity edema ABDOMEN:abdomen soft, non-tender and normal bowel sounds MUSCULOSKELETAL:no cyanosis of digits and no clubbing  NEURO: alert & oriented x 3 with fluent speech, no focal motor/sensory deficits EXTREMITIES: No lower extremity edema ASSESSMENT & PLAN:  Malignant neoplasm of lower-outer quadrant of left breast of female, estrogen receptor positive ([place]) 06/19/2016 Left breast biopsy 3:30: IDC with DCIS grade 1, ER 90%, PR 50%, Ki-67 15%, HER-2 negative ratio 1.13; biopsy 5:30 position: IDC grade 1  Breast MRI 07/13/2016 Large area of abnormal enhancement lower inner and lower outer quadrants left breast spanning 9 cm x 6.4 cm x 5.3 cm, no abnormal enlarged lymph nodes; T3 N0 stage II a (New AJCC staging)  Oncotype DXscore 22, intermediate risk, chemotherapy not felt to have significant benefit.  Treatment plan: 1. Antiestrogen therapy with anastrozole 1 mg daily started 07/15/2016 2. followed by breast conserving surgery versus mastectomy 3. Followed by adjuvant radiation 4. Followed by adjuvant antiestrogen therapy -------------------------------------------------------------------- Anastrozole toxicities: Denies any hot flashes or myalgias. Patient did not have a good response to antiestrogen therapy at six-month time point.  However she was not interested in surgery at  that time.  So she was given 6 more months of antiestrogen therapy.  Breast MRI 08/10/2017: Previously seen small enhancing mass right breast outer quadrant is smaller (decreased from 7 mm to 3 mm); left breast irregular enhancing mass 2.9 x 5.3 x 2.3 cm unchanged; left breast outer quadrant 2.2 x 3 x 1.9 cm mass slightly larger extending to and involving the skin.  Small satellite nodule central left breast 0.4 cm increased.  Lymph node left axilla 1.2 cm previously 0.8 cm  Recommendation: 1.  Ultrasound-guided biopsy of the left axillary lymph node  2. patient needs to see surgery to discuss surgical plans.  She would need a mastectomy based on multifocal nature of her disease with axillary node dissection if the lymph node is positive. 3.  Followed by radiation 4. Followed by antiestrogen therapy  Patient is agreeable to undergo the lymph node biopsy and see Dr. Barry Dienes for surgical options.  I discussed with her that she will need mastectomy.  She may be willing to undergo surgery at this time based on the recent MRI results.  No orders of the defined types were placed in this encounter.  The patient has a good understanding of the overall plan. she agrees with it. she  will call with any problems that may develop before the next visit here.   Harriette Ohara, MD 08/17/17

## 2017-08-17 NOTE — Assessment & Plan Note (Signed)
06/19/2016 Left breast biopsy 3:30: IDC with DCIS grade 1, ER 90%, PR 50%, Ki-67 15%, HER-2 negative ratio 1.13; biopsy 5:30 position: IDC grade 1  Breast MRI 07/13/2016 Large area of abnormal enhancement lower inner and lower outer quadrants left breast spanning 9 cm x 6.4 cm x 5.3 cm, no abnormal enlarged lymph nodes; T3 N0 stage II a (New AJCC staging)  Oncotype DXscore 22, intermediate risk, chemotherapy not felt to have significant benefit.  Treatment plan: 1. Antiestrogen therapy with anastrozole 1 mg daily started 07/15/2016 2. followed by breast conserving surgery versus mastectomy 3. Followed by adjuvant radiation 4. Followed by adjuvant antiestrogen therapy -------------------------------------------------------------------- Anastrozole toxicities: Denies any hot flashes or myalgias. Patient did not have a good response to antiestrogen therapy at six-month time point.  However she was not interested in surgery at that time.  So she was given 6 more months of antiestrogen therapy.  Breast MRI 08/10/2017: Previously seen small enhancing mass right breast outer quadrant is smaller (decreased from 7 mm to 3 mm); left breast irregular enhancing mass 2.9 x 5.3 x 2.3 cm unchanged; left breast outer quadrant 2.2 x 3 x 1.9 cm mass slightly larger extending to and involving the skin.  Small satellite nodule central left breast 0.4 cm increased.  Lymph node left axilla 1.2 cm previously 0.8 cm  Recommendation: 1.  Ultrasound-guided biopsy of the left axillary lymph node  2. patient needs to see surgery to discuss surgical plans.  She would need a mastectomy based on multifocal nature of her disease with axillary node dissection if the lymph node is positive. 3.  Followed by radiation 4. Followed by antiestrogen therapy

## 2017-08-17 NOTE — Telephone Encounter (Signed)
Spoke with patient to give her an appointment for U/S and biopsy of her left axillary lymph node.  Offered 6/6 appt. But due to work can only due Tuesday's.  Appointment confirmed for 6/11 at 910am.   Pt. Aware.

## 2017-08-19 ENCOUNTER — Other Ambulatory Visit: Payer: Commercial Managed Care - PPO

## 2017-08-24 ENCOUNTER — Other Ambulatory Visit: Payer: Commercial Managed Care - PPO

## 2017-08-31 ENCOUNTER — Other Ambulatory Visit: Payer: Self-pay | Admitting: General Surgery

## 2017-08-31 ENCOUNTER — Ambulatory Visit
Admission: RE | Admit: 2017-08-31 | Discharge: 2017-08-31 | Disposition: A | Discharge status: Home / Self Care | Payer: Commercial Managed Care - PPO | Source: Ambulatory Visit | Attending: Hematology and Oncology | Admitting: Hematology and Oncology

## 2017-08-31 ENCOUNTER — Other Ambulatory Visit: Payer: Self-pay | Admitting: Hematology and Oncology

## 2017-08-31 DIAGNOSIS — C50512 Malignant neoplasm of lower-outer quadrant of left female breast: Secondary | ICD-10-CM

## 2017-08-31 DIAGNOSIS — R928 Other abnormal and inconclusive findings on diagnostic imaging of breast: Secondary | ICD-10-CM

## 2017-08-31 DIAGNOSIS — Z170 Estrogen receptor positive status [ER+]: Secondary | ICD-10-CM

## 2017-08-31 DIAGNOSIS — R590 Localized enlarged lymph nodes: Secondary | ICD-10-CM | POA: Diagnosis not present

## 2017-08-31 DIAGNOSIS — C50912 Malignant neoplasm of unspecified site of left female breast: Secondary | ICD-10-CM | POA: Diagnosis not present

## 2017-09-07 DIAGNOSIS — Z170 Estrogen receptor positive status [ER+]: Secondary | ICD-10-CM | POA: Diagnosis not present

## 2017-09-07 DIAGNOSIS — C50812 Malignant neoplasm of overlapping sites of left female breast: Secondary | ICD-10-CM | POA: Diagnosis not present

## 2017-09-10 ENCOUNTER — Other Ambulatory Visit: Payer: Self-pay | Admitting: General Surgery

## 2017-09-10 DIAGNOSIS — Z170 Estrogen receptor positive status [ER+]: Secondary | ICD-10-CM

## 2017-09-10 DIAGNOSIS — C50512 Malignant neoplasm of lower-outer quadrant of left female breast: Secondary | ICD-10-CM

## 2017-09-13 DIAGNOSIS — C801 Malignant (primary) neoplasm, unspecified: Secondary | ICD-10-CM

## 2017-09-13 HISTORY — DX: Malignant (primary) neoplasm, unspecified: C80.1

## 2017-09-17 ENCOUNTER — Telehealth: Payer: Self-pay | Admitting: Hematology and Oncology

## 2017-09-17 ENCOUNTER — Other Ambulatory Visit: Payer: Commercial Managed Care - PPO

## 2017-09-17 NOTE — Telephone Encounter (Signed)
Mailed patient calendar of upcoming august appts per 7/3 sch message

## 2017-09-21 ENCOUNTER — Encounter (HOSPITAL_COMMUNITY): Payer: Commercial Managed Care - PPO

## 2017-10-05 DIAGNOSIS — M5416 Radiculopathy, lumbar region: Secondary | ICD-10-CM | POA: Diagnosis not present

## 2017-10-05 DIAGNOSIS — M9905 Segmental and somatic dysfunction of pelvic region: Secondary | ICD-10-CM | POA: Diagnosis not present

## 2017-10-05 DIAGNOSIS — M9903 Segmental and somatic dysfunction of lumbar region: Secondary | ICD-10-CM | POA: Diagnosis not present

## 2017-10-18 NOTE — H&P (Addendum)
History of Present Illness: Kathryn Lucas is a 57 y.o.  female  with a history of  palpable masses left breast. Initial MMG/US demonstrated 3 masses: 3:00 position: 2.2 cm; 5:30 position: 2.5 cm; 6:30 position: 0.7 cm, axilla negative. Biopsy with IDC with DCIS, ER/PR+, Her2-. MRI 2018 showed enhancement lower inner and lower outer quadrants left breast spanning 9 cm. Mastectomy recommended and patient declined. She completed one year anastrazole and MRI 07/2017 demonstrated increased enhancement and size of mass in the outer left breast extending to the skin, 4 mm satellite nodule medial to this mass wasalso larger. Additionalmassesinvolving the lower anterior third of the left breast and the linearenhancement connecting the 2 dominant masses was stable in size. Left axillary LN measuredlarger. A RIGHT enhancing mass was smaller read as possibly resolving fat necrosis or small lymph node. Attention on follow-up recommended.  Biopsy right axilla 6.18.19 benign, no lymphoid tissue noted.  Oncotype 22  Patient agrees to mastectomy at this time.   Current 34 G, happy with this and goal would be to have nothing done to right breast. Notes mother had mastectomy and never had reconstruction she recalls large scar and does not want this.  Wt stable. Works full time at [employer], job is in Designer, jewellery. Lives alone. States has friends to assist with post op care.  Allergies: No Known Allergies  Current Medications:  Current Outpatient Prescriptions:  .  anastrozole (ARIMIDEX) 1 mg tablet, Take by mouth., Disp: , Rfl:  .  fluticasone (FLONASE) 50 mcg/actuation nasal spray, by Nasal route., Disp: , Rfl:  .  levothyroxine (SYNTHROID) 125 MCG tablet, TK 1 T PO ONCE D, Disp: , Rfl:  .  meloxicam (MOBIC) 15 MG tablet, Take by mouth., Disp: , Rfl:  .  methocarbamol (ROBAXIN) 500 MG tablet, Take 1 tablet (500 mg total) by mouth 3 (three) times daily as needed for up to 10 days., Disp: 30 tablet,  Rfl: 0 .  ondansetron (ZOFRAN) 4 MG tablet, Take 1 tablet (4 mg total) by mouth every 8 (eight) hours as needed for up to 7 days for Nausea / Vomiting., Disp: 15 tablet, Rfl: 0 .  oxyCODONE-acetaminophen (PERCOCET) 5-325 mg per tablet, Take 1 tablet by mouth every 4 (four) hours as needed for up to 5 days., Disp: 30 tablet, Rfl: 0 .  sulfamethoxazole-trimethoprim (BACTRIM DS) 800-160 mg per tablet, Take 1 tablet by mouth 2 times daily for 7 days., Disp: 14 tablet, Rfl: 0  Past Medical Problems: PastMedicalHistory      Past Medical History:  Diagnosis Date  .           Past Surgical History: PastSurgicalHistory  No past surgical history on file.    The patient has had anesthesia or sedation in the past.   The patient has not had problems with anesthesia.  The patient does not have a family history of anesthesia problems.      Review of Systems: General ROS: negative Dermatological ROS: negative Cardiovascular ROS: negative ENT ROS: negative Gastrointestinal ROS: no abdominal pain, change in bowel habits, or black or bloody stools  Physical Exam: Vital Signs BP 124/64 (Site: Left arm, Position: Sitting, BP Cuff Size: Medium)   Pulse 68   Temp 97.8 F (36.6 C) (Oral)   Ht 1.803 m (_0 )   Wt 83.8 kg (184 lb 11.9 oz)   BMI 25.77 kg/m  Lymphadenopathy:    She has no axillary adenopathy.  CV: normal heart sounds PULM: clear to auscultation  Grade 1 to psuedoptosis bilateral Palpable mass 9 o clock left breast with depression over area SN to nipple R 26 L 26 cm BW R 18 L 18 CW 14 cm Nipple to IMF R 11 L 11 cm  Assessment: Left breast cancer mulitcentric ER+  Plan: Plan NSM with lateral limb. Plan immediate expander placement, acellular dermis reconstruction.  Reviewed incisions, drains, OR length, hospital stay and recovery, limitations. Discussed process of expansion and implant based risks including rupture, MRI surveillance for silicone  implants, infection requiring surgery or removal, contracture. Reviewed risks mastectomy flap necrosis requiring additional surgery.  Discussed use of acellular dermis in reconstruction, cadaveric source, incorporation over several weeks, risk that if has seroma or infection can act as additional nidus for infection if not incorporated.  Discussed prepectoral vs sub pectoral reconstruction. Discussed with patient and benefit of this is no animation deformity, may be less pain. Risk may be more visible rippling over upper poles, greater need of ADM. Reviewed pre pectoral would require larger amount acellular dermis, more drains. Discussed any type reconstruction also risks long term displacement implant and visible rippling. If prepectoral counseled I would recommend she be comfortable with silicone implants as more options that have less rippling. She agrees to prepectoral placement.  Reviewed reconstruction will be asensate and not stimulate. Reviewed additional risks including but not limited to risks mastectomy flap necrosis requiring additional surgery, seroma, hematoma, asymmetry, need to additional procedures, fat necrosis, DVT/PE, damage to adjacent structures, cardiopulmonary complications.  Rx: Percocet, Zofran, Bactrim DS handed to pt,  Robaxin sent to pharmacy     Irene Limbo, MD [facility] Plastic & Reconstructive Surgery [phone], pin [phone]

## 2017-10-19 ENCOUNTER — Encounter (HOSPITAL_BASED_OUTPATIENT_CLINIC_OR_DEPARTMENT_OTHER): Payer: Self-pay | Admitting: *Deleted

## 2017-10-19 ENCOUNTER — Other Ambulatory Visit: Payer: Self-pay

## 2017-10-25 ENCOUNTER — Ambulatory Visit
Admission: RE | Admit: 2017-10-25 | Discharge: 2017-10-25 | Disposition: A | Discharge status: Home / Self Care | Payer: Commercial Managed Care - PPO | Source: Ambulatory Visit | Attending: General Surgery | Admitting: General Surgery

## 2017-10-25 ENCOUNTER — Encounter (HOSPITAL_BASED_OUTPATIENT_CLINIC_OR_DEPARTMENT_OTHER)
Admission: RE | Admit: 2017-10-25 | Discharge: 2017-10-25 | Disposition: A | Discharge status: Home / Self Care | Payer: Commercial Managed Care - PPO | Source: Ambulatory Visit | Attending: General Surgery | Admitting: General Surgery

## 2017-10-25 ENCOUNTER — Other Ambulatory Visit: Payer: Self-pay | Admitting: General Surgery

## 2017-10-25 DIAGNOSIS — C773 Secondary and unspecified malignant neoplasm of axilla and upper limb lymph nodes: Secondary | ICD-10-CM | POA: Diagnosis not present

## 2017-10-25 DIAGNOSIS — Z170 Estrogen receptor positive status [ER+]: Secondary | ICD-10-CM

## 2017-10-25 DIAGNOSIS — C50512 Malignant neoplasm of lower-outer quadrant of left female breast: Secondary | ICD-10-CM

## 2017-10-25 LAB — URINALYSIS, ROUTINE W REFLEX MICROSCOPIC
BACTERIA UA: NONE SEEN
Bilirubin Urine: NEGATIVE
Glucose, UA: NEGATIVE mg/dL
KETONES UR: NEGATIVE mg/dL
LEUKOCYTES UA: NEGATIVE
Nitrite: NEGATIVE
PROTEIN: NEGATIVE mg/dL
Specific Gravity, Urine: 1.011 (ref 1.005–1.030)
pH: 7 (ref 5.0–8.0)

## 2017-10-25 NOTE — H&P (Signed)
Kathryn Lucas Location: [facility] Patient #: [id number] DOB: 1960-09-01 Single / Language: Kathryn Lucas / Race: White Female   History of Present Illness The patient is a 57 year old female who presents for a follow-up for Breast cancer. Pt is a 57 yo F diagnosed with left breast cancer 06/2016 after palpating a mass in her left breast. She apparently had an area of concern on her screening mammogram in 2013, but did not come for diagnostic imaging. This time she has 3 areas of concern in her left breast, but no areas of concern in the axilla. At 3 o'clock there is a 2.2 cm mass, at 5:30 there is a 2.5 cm mass, at at 6:30 there is a 7 mm mass. The 3 o'clock and the 5:30 o'clock masses were both biopsied and were positive for grade 1 invasive ductal carcinoma with DCIS, ER/PR positive, her 2 negative. Ki 67 15%. She denies breast pain. Her mother had breast cancer. She is a G0P0 with menarche at age 13. Menopause is 46-50. She did use OCPs for a few years.   She underwent MRI and MR biopsy. This was also positive. The area that needs to be resected is quite large. I reviewed the images wtih the radiologist and with the patient. The patient strongly desired not to have a mastectomy if at all possible. Her mother had a bad experience with reconstruction.    She has had follow up imaging. She has developed a new satellite nodule on the dominant lesion and a suspicious node. She had that biopsied this morning. Path is pending. She has had some more time to consider and is willing to have reconstruction. She did not feel like she clicked with Dr. Marla Lucas.   MR 08/10/2017 IMPRESSION: 1. Spiculated mass in the outer left breast extending to the skin is larger, more solid appearing and more intensely enhancing when compared to the prior exam. In addition, a 4 mm satellite nodule medial to this mass is also larger when compared to the prior exam.  2. The additional mass  involving the lower anterior third of the left breast as well as the linear nodular enhancement connecting the 2 dominant masses is overall stable in size in appearance when compared to prior exam.  3. Left axillary lymph node measures larger when compared to the prior exams concerning for nodal metastases.  4. Decreased size of small enhancing mass in the outer right breast, possibly resolving fat necrosis or small lymph node. Attention on follow-up recommended.  RECOMMENDATION: Given the increased size of the left axillary lymph node when compared to prior MRIs, ultrasound-guided biopsy is recommended to rule out nodal metastases.  BI-RADS CATEGORY 4: Suspicious.  MRI 07/13/2016 FINDINGS: Breast composition: b. Scattered fibroglandular tissue.  Background parenchymal enhancement: Minimal  Right breast: No mass or abnormal enhancement.  Left breast: There is abnormal mass and non mass like enhancement that extends from the biopsy clip in the 5:30 o'clock position of the lower outer left breast, across the midline to the lower inner aspect of the left breast, spanning 6.8 cm from right to left and approximate 2.5 cm from anterior to posterior. This enhancement is contiguous, along a thin bridge of enhancement, with the more posterior enhancing mass, and the associated biopsy clip artifact, in the 3:30 o'clock position of the left breast. This spiculated mass measures 2.7 x 1.8 cm transversely. There are 2 small areas of enhancement just anterior and inferior to this, the larger of the 2  measuring 7 mm. Measured obliquely, the combination of this mass and non mass like enhancement extends for 9 cm from the anterior, medial margin of the lower inner quadrant enhancement to the posterolateral margin of the lower outer quadrant near the 3 o'clock position. From superior to inferior, the combined area of abnormal enhancement spans 5.3 cm.  Lymph nodes: No abnormal appearing  lymph nodes.  Ancillary findings: None.  IMPRESSION: 1. There is a large area of abnormal enhancement spanning the lower inner aspect of the left breast anteriorly to the more posterolateral aspect the left breast, including the biopsy clip artifacts representing the areas of known invasive ductal carcinoma. This entire area of enhancement is likely carcinoma, indicating disease involving the lower outer and lower inner quadrants of the breast, spanning a total of 9 cm obliquely, 6.4 cm from anterior to posterior and 5.3 cm from superior inferior. 2. There are no enlarged or abnormal left or right axillary lymph nodes. 3. No evidence of right breast malignancy.  RECOMMENDATION: 1. The abnormal enhancement medial to the 530 o'clock biopsy site, in the lower inner quadrant of the left breast, could be biopsied under MRI guidance if this would alter planned therapy. 2. Otherwise, treatment as planned for the known multifocal left breast carcinoma.  BI-RADS CATEGORY 6: Known biopsy-proven malignancy.    Allergies No Known Drug Allergies   Medication History Anastrozole (1MG  Tablet, Oral) Active. Multivitamins (Oral) Active. Synthroid (125MCG Tablet, Oral) Active. Calcium+D3 (600-800MG -UNIT Tablet, Oral) Active. Levothyroxine Sodium (175MCG Tablet, Oral) Active. Medications Reconciled    Review of Systems All other systems negative  Vitals  Weight: 183.5 lb Height: 71in Body Surface Area: 2.03 m Body Mass Index: 25.59 kg/m  Temp.: 98.5F(Oral)  Pulse: 67 (Regular)  BP: 120/80 (Sitting, Left Arm, Standard)       Physical Exam General Mental Status-Alert. General Appearance-Consistent with stated age. Hydration-Well hydrated. Voice-Normal.  Head and Neck Head-normocephalic, atraumatic with no lesions or palpable masses.  Chest and Lung Exam Chest and lung exam reveals -quiet, even and easy respiratory effort with no use of  accessory muscles. Inspection Chest Wall - Normal. Back - normal.  Breast Note: left breast wtih more skin tethering at 3 o'clock. Minimal change in palpable mass laterally. medially mass is not palpable. no palpable LAD.   Cardiovascular Cardiovascular examination reveals -normal pedal pulses bilaterally. Note: regular rate and rhythm    Assessment & Plan PRIMARY CANCER OF LOWER OUTER QUADRANT OF LEFT FEMALE BREAST (C50.512) Impression: Will plan left mastectomy and sentinel lymph node biopsy. If biopsy of node today is positive, will also add seed targeted LN excision to above surgery.  I will refer to Dr. Iran Planas to consider reconstruction. I think she may be a candidate for nipple sparing mastectomy with a lateral incision to remove the tethered skin.  Continue antiestrogen tx.  I reviewed risks of bleeding, infection, chronic pain, numbness, need for additional surgeries, potential for dissatisfaction wtih scar, and more.  She wishes to proceed. Current Plans You are being scheduled for surgery- Our schedulers will call you.  You should hear from our office's scheduling department within 5 working days about the location, date, and time of surgery. We try to make accommodations for patient's preferences in scheduling surgery, but sometimes the OR schedule or the surgeon's schedule prevents [place] from making those accommodations.  If you have not heard from our office [phone]) in 5 working days, call the office and ask for your surgeon's nurse.  If you have other  questions about your diagnosis, plan, or surgery, call the office and ask for your surgeon's nurse.  Referred to Surgery - Plastic, for evaluation and follow up (Plastic Surgery). Routine. Pt Education - [facility] Mastectomy [facility]   Signed by Stark Klein, MD

## 2017-10-25 NOTE — Progress Notes (Signed)
Ensure pre surgery drink given with instructions to complete by 0600 dos, surgical soap given with instructions, pt verbalized understanding. 

## 2017-10-26 ENCOUNTER — Encounter (HOSPITAL_COMMUNITY)
Admission: RE | Admit: 2017-10-26 | Discharge: 2017-10-26 | Disposition: A | Discharge status: Home / Self Care | Payer: Commercial Managed Care - PPO | Source: Ambulatory Visit | Attending: General Surgery | Admitting: General Surgery

## 2017-10-26 ENCOUNTER — Ambulatory Visit
Admission: RE | Admit: 2017-10-26 | Discharge: 2017-10-26 | Disposition: A | Discharge status: Home / Self Care | Payer: Commercial Managed Care - PPO | Source: Ambulatory Visit | Attending: General Surgery | Admitting: General Surgery

## 2017-10-26 ENCOUNTER — Ambulatory Visit (HOSPITAL_BASED_OUTPATIENT_CLINIC_OR_DEPARTMENT_OTHER): Payer: Commercial Managed Care - PPO | Admitting: Certified Registered"

## 2017-10-26 ENCOUNTER — Encounter (HOSPITAL_BASED_OUTPATIENT_CLINIC_OR_DEPARTMENT_OTHER)
Admission: RE | Disposition: A | Discharge status: Home / Self Care | Payer: Self-pay | Source: Ambulatory Visit | Attending: General Surgery

## 2017-10-26 ENCOUNTER — Ambulatory Visit (HOSPITAL_COMMUNITY)
Admission: RE | Admit: 2017-10-26 | Discharge: 2017-10-27 | Disposition: A | Discharge status: Home / Self Care | Payer: Commercial Managed Care - PPO | Source: Ambulatory Visit | Attending: General Surgery | Admitting: General Surgery

## 2017-10-26 ENCOUNTER — Encounter (HOSPITAL_BASED_OUTPATIENT_CLINIC_OR_DEPARTMENT_OTHER): Payer: Self-pay | Admitting: Certified Registered"

## 2017-10-26 ENCOUNTER — Other Ambulatory Visit: Payer: Self-pay

## 2017-10-26 DIAGNOSIS — C50512 Malignant neoplasm of lower-outer quadrant of left female breast: Secondary | ICD-10-CM

## 2017-10-26 DIAGNOSIS — E039 Hypothyroidism, unspecified: Secondary | ICD-10-CM | POA: Diagnosis not present

## 2017-10-26 DIAGNOSIS — Z170 Estrogen receptor positive status [ER+]: Secondary | ICD-10-CM

## 2017-10-26 DIAGNOSIS — C50912 Malignant neoplasm of unspecified site of left female breast: Secondary | ICD-10-CM | POA: Diagnosis not present

## 2017-10-26 DIAGNOSIS — C773 Secondary and unspecified malignant neoplasm of axilla and upper limb lymph nodes: Secondary | ICD-10-CM | POA: Diagnosis not present

## 2017-10-26 DIAGNOSIS — G8918 Other acute postprocedural pain: Secondary | ICD-10-CM | POA: Diagnosis not present

## 2017-10-26 DIAGNOSIS — Z79899 Other long term (current) drug therapy: Secondary | ICD-10-CM | POA: Insufficient documentation

## 2017-10-26 DIAGNOSIS — C50412 Malignant neoplasm of upper-outer quadrant of left female breast: Secondary | ICD-10-CM | POA: Insufficient documentation

## 2017-10-26 HISTORY — PX: BREAST RECONSTRUCTION WITH PLACEMENT OF TISSUE EXPANDER AND ALLODERM: SHX6805

## 2017-10-26 HISTORY — PX: MASTECTOMY WITH RADIOACTIVE SEED GUIDED EXCISION AND AXILLARY SENTINEL LYMPH NODE BIOPSY: SHX6736

## 2017-10-26 HISTORY — DX: Hypothyroidism, unspecified: E03.9

## 2017-10-26 HISTORY — DX: Malignant (primary) neoplasm, unspecified: C80.1

## 2017-10-26 SURGERY — MASTECTOMY WITH RADIOACTIVE SEED GUIDED EXCISION AND AXILLARY SENTINEL LYMPH NODE BIOPSY
Anesthesia: General | Site: Breast | Laterality: Left

## 2017-10-26 MED ORDER — FENTANYL CITRATE (PF) 100 MCG/2ML IJ SOLN
INTRAMUSCULAR | Status: AC
  Filled 2017-10-26: qty 2

## 2017-10-26 MED ORDER — ROPIVACAINE HCL 7.5 MG/ML IJ SOLN
INTRAMUSCULAR | Status: DC | PRN
  Administered 2017-10-26: 25 mL via PERINEURAL

## 2017-10-26 MED ORDER — OXYCODONE HCL 5 MG/5ML PO SOLN: 5.0000 mg | Freq: Once | ORAL | Status: DC | PRN

## 2017-10-26 MED ORDER — LEVOTHYROXINE SODIUM 112 MCG PO TABS: 112.0000 ug | ORAL_TABLET | Freq: Every day | ORAL | Status: DC

## 2017-10-26 MED ORDER — LIDOCAINE 2% (20 MG/ML) 5 ML SYRINGE
INTRAMUSCULAR | Status: AC
  Filled 2017-10-26: qty 20

## 2017-10-26 MED ORDER — GABAPENTIN 300 MG PO CAPS
300.0000 mg | ORAL_CAPSULE | ORAL | Status: AC
  Administered 2017-10-26: 300 mg via ORAL

## 2017-10-26 MED ORDER — METHOCARBAMOL 500 MG PO TABS
500.0000 mg | ORAL_TABLET | Freq: Four times a day (QID) | ORAL | Status: DC | PRN
  Administered 2017-10-27: 500 mg via ORAL
  Filled 2017-10-26: qty 1

## 2017-10-26 MED ORDER — FENTANYL CITRATE (PF) 100 MCG/2ML IJ SOLN: 25.0000 ug | INTRAMUSCULAR | Status: DC | PRN

## 2017-10-26 MED ORDER — PROPOFOL 10 MG/ML IV BOLUS
INTRAVENOUS | Status: AC
  Filled 2017-10-26: qty 20

## 2017-10-26 MED ORDER — ENOXAPARIN SODIUM 40 MG/0.4ML ~~LOC~~ SOLN
40.0000 mg | SUBCUTANEOUS | Status: DC
  Administered 2017-10-27: 40 mg via SUBCUTANEOUS
  Filled 2017-10-26: qty 0.4

## 2017-10-26 MED ORDER — PROPOFOL 10 MG/ML IV BOLUS
INTRAVENOUS | Status: DC | PRN
  Administered 2017-10-26: 150 mg via INTRAVENOUS

## 2017-10-26 MED ORDER — CEFAZOLIN SODIUM-DEXTROSE 2-4 GM/100ML-% IV SOLN
2.0000 g | INTRAVENOUS | Status: AC
  Administered 2017-10-26: 2 g via INTRAVENOUS

## 2017-10-26 MED ORDER — DEXAMETHASONE SODIUM PHOSPHATE 4 MG/ML IJ SOLN
INTRAMUSCULAR | Status: DC | PRN
  Administered 2017-10-26: 10 mg via INTRAVENOUS

## 2017-10-26 MED ORDER — CELECOXIB 200 MG PO CAPS
ORAL_CAPSULE | ORAL | Status: AC
  Filled 2017-10-26: qty 1

## 2017-10-26 MED ORDER — ONDANSETRON HCL 4 MG/2ML IJ SOLN: 4.0000 mg | Freq: Once | INTRAMUSCULAR | Status: DC | PRN

## 2017-10-26 MED ORDER — ONDANSETRON 4 MG PO TBDP: 4.0000 mg | ORAL_TABLET | Freq: Four times a day (QID) | ORAL | Status: DC | PRN

## 2017-10-26 MED ORDER — ONDANSETRON HCL 4 MG/2ML IJ SOLN
INTRAMUSCULAR | Status: DC | PRN
  Administered 2017-10-26: 4 mg via INTRAVENOUS

## 2017-10-26 MED ORDER — PROPOFOL 500 MG/50ML IV EMUL
INTRAVENOUS | Status: DC | PRN
  Administered 2017-10-26: 75 ug/kg/min via INTRAVENOUS

## 2017-10-26 MED ORDER — CEFAZOLIN SODIUM-DEXTROSE 2-4 GM/100ML-% IV SOLN
INTRAVENOUS | Status: AC
  Filled 2017-10-26: qty 100

## 2017-10-26 MED ORDER — GABAPENTIN 300 MG PO CAPS
300.0000 mg | ORAL_CAPSULE | Freq: Two times a day (BID) | ORAL | Status: DC
  Administered 2017-10-26: 300 mg via ORAL
  Filled 2017-10-26: qty 1

## 2017-10-26 MED ORDER — HYDROMORPHONE HCL 1 MG/ML IJ SOLN: 0.5000 mg | INTRAMUSCULAR | Status: DC | PRN

## 2017-10-26 MED ORDER — CHLORHEXIDINE GLUCONATE CLOTH 2 % EX PADS: 6.0000 | MEDICATED_PAD | Freq: Once | CUTANEOUS | Status: DC

## 2017-10-26 MED ORDER — HYDROCODONE-ACETAMINOPHEN 5-325 MG PO TABS: 1.0000 | ORAL_TABLET | ORAL | Status: DC | PRN

## 2017-10-26 MED ORDER — MIDAZOLAM HCL 2 MG/2ML IJ SOLN
1.0000 mg | INTRAMUSCULAR | Status: DC | PRN
  Administered 2017-10-26: 2 mg via INTRAVENOUS

## 2017-10-26 MED ORDER — ACETAMINOPHEN 325 MG PO TABS: 325.0000 mg | ORAL_TABLET | ORAL | Status: DC | PRN

## 2017-10-26 MED ORDER — CELECOXIB 200 MG PO CAPS
200.0000 mg | ORAL_CAPSULE | ORAL | Status: AC
  Administered 2017-10-26: 200 mg via ORAL

## 2017-10-26 MED ORDER — ROCURONIUM BROMIDE 100 MG/10ML IV SOLN
INTRAVENOUS | Status: DC | PRN
  Administered 2017-10-26: 50 mg via INTRAVENOUS

## 2017-10-26 MED ORDER — GABAPENTIN 300 MG PO CAPS
ORAL_CAPSULE | ORAL | Status: AC
  Filled 2017-10-26: qty 1

## 2017-10-26 MED ORDER — LACTATED RINGERS IV SOLN
INTRAVENOUS | Status: DC
  Administered 2017-10-26 (×3): via INTRAVENOUS

## 2017-10-26 MED ORDER — TECHNETIUM TC 99M SULFUR COLLOID FILTERED
1.0000 | Freq: Once | INTRAVENOUS | Status: AC | PRN
  Administered 2017-10-26: 1 via INTRADERMAL

## 2017-10-26 MED ORDER — ONDANSETRON HCL 4 MG/2ML IJ SOLN: 4.0000 mg | Freq: Four times a day (QID) | INTRAMUSCULAR | Status: DC | PRN

## 2017-10-26 MED ORDER — PHENYLEPHRINE 40 MCG/ML (10ML) SYRINGE FOR IV PUSH (FOR BLOOD PRESSURE SUPPORT)
PREFILLED_SYRINGE | INTRAVENOUS | Status: AC
  Filled 2017-10-26: qty 10

## 2017-10-26 MED ORDER — SUGAMMADEX SODIUM 200 MG/2ML IV SOLN
INTRAVENOUS | Status: DC | PRN
  Administered 2017-10-26: 200 mg via INTRAVENOUS

## 2017-10-26 MED ORDER — EPHEDRINE 5 MG/ML INJ
INTRAVENOUS | Status: AC
  Filled 2017-10-26: qty 20

## 2017-10-26 MED ORDER — LIDOCAINE HCL (PF) 1 % IJ SOLN
INTRAMUSCULAR | Status: AC
  Filled 2017-10-26: qty 30

## 2017-10-26 MED ORDER — EPHEDRINE SULFATE 50 MG/ML IJ SOLN
INTRAMUSCULAR | Status: DC | PRN
  Administered 2017-10-26 (×2): 10 mg via INTRAVENOUS

## 2017-10-26 MED ORDER — KCL IN DEXTROSE-NACL 20-5-0.45 MEQ/L-%-% IV SOLN
INTRAVENOUS | Status: DC
  Administered 2017-10-26: 15:00:00 via INTRAVENOUS
  Filled 2017-10-26: qty 1000

## 2017-10-26 MED ORDER — POVIDONE-IODINE 10 % EX SOLN
CUTANEOUS | Status: DC | PRN
  Administered 2017-10-26: 1 via TOPICAL

## 2017-10-26 MED ORDER — ACETAMINOPHEN 500 MG PO TABS
ORAL_TABLET | ORAL | Status: AC
  Filled 2017-10-26: qty 2

## 2017-10-26 MED ORDER — SODIUM CHLORIDE 0.9 % IV SOLN
INTRAVENOUS | Status: DC | PRN
  Administered 2017-10-26: 1000 mL

## 2017-10-26 MED ORDER — DEXAMETHASONE SODIUM PHOSPHATE 10 MG/ML IJ SOLN
INTRAMUSCULAR | Status: AC
  Filled 2017-10-26: qty 2

## 2017-10-26 MED ORDER — ONDANSETRON HCL 4 MG/2ML IJ SOLN
INTRAMUSCULAR | Status: AC
  Filled 2017-10-26: qty 8

## 2017-10-26 MED ORDER — LIDOCAINE HCL (CARDIAC) PF 100 MG/5ML IV SOSY
PREFILLED_SYRINGE | INTRAVENOUS | Status: DC | PRN
  Administered 2017-10-26: 30 mg via INTRAVENOUS

## 2017-10-26 MED ORDER — ROCURONIUM BROMIDE 10 MG/ML (PF) SYRINGE
PREFILLED_SYRINGE | INTRAVENOUS | Status: AC
  Filled 2017-10-26: qty 10

## 2017-10-26 MED ORDER — OXYCODONE HCL 5 MG PO TABS: 5.0000 mg | ORAL_TABLET | Freq: Once | ORAL | Status: DC | PRN

## 2017-10-26 MED ORDER — FENTANYL CITRATE (PF) 100 MCG/2ML IJ SOLN
50.0000 ug | INTRAMUSCULAR | Status: AC | PRN
  Administered 2017-10-26 (×4): 50 ug via INTRAVENOUS

## 2017-10-26 MED ORDER — PROPOFOL 500 MG/50ML IV EMUL
INTRAVENOUS | Status: AC
  Filled 2017-10-26: qty 100

## 2017-10-26 MED ORDER — SCOPOLAMINE 1 MG/3DAYS TD PT72: 1.0000 | MEDICATED_PATCH | Freq: Once | TRANSDERMAL | Status: DC | PRN

## 2017-10-26 MED ORDER — CEFAZOLIN SODIUM-DEXTROSE 1-4 GM/50ML-% IV SOLN
1.0000 g | Freq: Three times a day (TID) | INTRAVENOUS | Status: DC
  Administered 2017-10-26 – 2017-10-27 (×2): 1 g via INTRAVENOUS
  Filled 2017-10-26 (×2): qty 50

## 2017-10-26 MED ORDER — MIDAZOLAM HCL 2 MG/2ML IJ SOLN
INTRAMUSCULAR | Status: AC
  Filled 2017-10-26: qty 2

## 2017-10-26 MED ORDER — ACETAMINOPHEN 160 MG/5ML PO SOLN: 325.0000 mg | ORAL | Status: DC | PRN

## 2017-10-26 MED ORDER — MEPERIDINE HCL 25 MG/ML IJ SOLN: 6.2500 mg | INTRAMUSCULAR | Status: DC | PRN

## 2017-10-26 MED ORDER — 0.9 % SODIUM CHLORIDE (POUR BTL) OPTIME
TOPICAL | Status: DC | PRN
  Administered 2017-10-26: 1000 mL

## 2017-10-26 MED ORDER — KETOROLAC TROMETHAMINE 30 MG/ML IJ SOLN
30.0000 mg | Freq: Three times a day (TID) | INTRAMUSCULAR | Status: AC
  Administered 2017-10-26 – 2017-10-27 (×3): 30 mg via INTRAVENOUS
  Filled 2017-10-26 (×3): qty 1

## 2017-10-26 MED ORDER — ACETAMINOPHEN 500 MG PO TABS
1000.0000 mg | ORAL_TABLET | ORAL | Status: AC
  Administered 2017-10-26: 1000 mg via ORAL

## 2017-10-26 SURGICAL SUPPLY — 97 items
ALLOGRAFT PERF 16X20 1.6+/-0.4 (Tissue) ×1 IMPLANT
BAG DECANTER FOR FLEXI CONT (MISCELLANEOUS) ×2 IMPLANT
BINDER BREAST LRG (GAUZE/BANDAGES/DRESSINGS) IMPLANT
BINDER BREAST MEDIUM (GAUZE/BANDAGES/DRESSINGS) IMPLANT
BINDER BREAST XLRG (GAUZE/BANDAGES/DRESSINGS) ×1 IMPLANT
BINDER BREAST XXLRG (GAUZE/BANDAGES/DRESSINGS) IMPLANT
BIOPATCH RED 1 DISK 7.0 (GAUZE/BANDAGES/DRESSINGS) IMPLANT
BLADE HEX COATED 2.75 (ELECTRODE) ×1 IMPLANT
BLADE SURG 10 STRL SS (BLADE) ×2 IMPLANT
BLADE SURG 15 STRL LF DISP TIS (BLADE) ×1 IMPLANT
BLADE SURG 15 STRL SS (BLADE) ×1
BNDG GAUZE ELAST 4 BULKY (GAUZE/BANDAGES/DRESSINGS) ×1 IMPLANT
CANISTER SUCT 1200ML W/VALVE (MISCELLANEOUS) ×2 IMPLANT
CHLORAPREP W/TINT 26ML (MISCELLANEOUS) ×3 IMPLANT
CLIP VESOCCLUDE LG 6/CT (CLIP) IMPLANT
CLIP VESOCCLUDE MED 6/CT (CLIP) ×4 IMPLANT
CLIP VESOCCLUDE SM WIDE 6/CT (CLIP) IMPLANT
COUNTER NEEDLE 1200 MAGNETIC (NEEDLE) IMPLANT
COVER MAYO STAND STRL (DRAPES) ×4 IMPLANT
COVER PROBE W GEL 5X96 (DRAPES) ×2 IMPLANT
DECANTER SPIKE VIAL GLASS SM (MISCELLANEOUS) IMPLANT
DERMABOND ADVANCED (GAUZE/BANDAGES/DRESSINGS) ×1
DERMABOND ADVANCED .7 DNX12 (GAUZE/BANDAGES/DRESSINGS) ×1 IMPLANT
DRAIN CHANNEL 15F RND FF W/TCR (WOUND CARE) IMPLANT
DRAIN CHANNEL 19F RND (DRAIN) ×2 IMPLANT
DRAPE TOP ARMCOVERS (MISCELLANEOUS) ×1 IMPLANT
DRAPE U-SHAPE 76X120 STRL (DRAPES) ×1 IMPLANT
DRAPE UTILITY XL STRL (DRAPES) ×2 IMPLANT
DRSG PAD ABDOMINAL 8X10 ST (GAUZE/BANDAGES/DRESSINGS) ×4 IMPLANT
DRSG TEGADERM 4X10 (GAUZE/BANDAGES/DRESSINGS) IMPLANT
ELECT BLADE 4.0 EZ CLEAN MEGAD (MISCELLANEOUS) ×2
ELECT BLADE 6.5 EXT (BLADE) ×1 IMPLANT
ELECT COATED BLADE 2.86 ST (ELECTRODE) ×2 IMPLANT
ELECT REM PT RETURN 9FT ADLT (ELECTROSURGICAL) ×2
ELECTRODE BLDE 4.0 EZ CLN MEGD (MISCELLANEOUS) ×1 IMPLANT
ELECTRODE REM PT RTRN 9FT ADLT (ELECTROSURGICAL) ×1 IMPLANT
EVACUATOR SILICONE 100CC (DRAIN) ×3 IMPLANT
EXPANDER TISSUE FV FOURTE 500 (Prosthesis & Implant Plastic) IMPLANT
GAUZE SPONGE 4X4 12PLY STRL (GAUZE/BANDAGES/DRESSINGS) ×2 IMPLANT
GLOVE BIO SURGEON STRL SZ 6 (GLOVE) ×6 IMPLANT
GLOVE BIO SURGEON STRL SZ 6.5 (GLOVE) ×1 IMPLANT
GLOVE BIO SURGEON STRL SZ7 (GLOVE) ×1 IMPLANT
GLOVE BIOGEL PI IND STRL 6.5 (GLOVE) ×1 IMPLANT
GLOVE BIOGEL PI IND STRL 7.0 (GLOVE) IMPLANT
GLOVE BIOGEL PI INDICATOR 6.5 (GLOVE) ×1
GLOVE BIOGEL PI INDICATOR 7.0 (GLOVE) ×2
GOWN STRL REUS W/ TWL LRG LVL3 (GOWN DISPOSABLE) ×4 IMPLANT
GOWN STRL REUS W/TWL 2XL LVL3 (GOWN DISPOSABLE) ×2 IMPLANT
GOWN STRL REUS W/TWL LRG LVL3 (GOWN DISPOSABLE) ×2
KIT FILL SYSTEM UNIVERSAL (SET/KITS/TRAYS/PACK) IMPLANT
LIGHT WAVEGUIDE WIDE FLAT (MISCELLANEOUS) IMPLANT
MARKER SKIN DUAL TIP RULER LAB (MISCELLANEOUS) ×1 IMPLANT
NDL HYPO 25X1 1.5 SAFETY (NEEDLE) ×1 IMPLANT
NDL SAFETY ECLIPSE 18X1.5 (NEEDLE) ×1 IMPLANT
NDL SPNL 18GX3.5 QUINCKE PK (NEEDLE) IMPLANT
NDL SPNL 22GX3.5 QUINCKE BK (NEEDLE) IMPLANT
NEEDLE HYPO 18GX1.5 SHARP (NEEDLE) ×1
NEEDLE HYPO 25X1 1.5 SAFETY (NEEDLE) ×2 IMPLANT
NEEDLE SPNL 18GX3.5 QUINCKE PK (NEEDLE) IMPLANT
NEEDLE SPNL 22GX3.5 QUINCKE BK (NEEDLE) IMPLANT
NS IRRIG 1000ML POUR BTL (IV SOLUTION) ×3 IMPLANT
PACK BASIN DAY SURGERY FS (CUSTOM PROCEDURE TRAY) ×2 IMPLANT
PACK UNIVERSAL I (CUSTOM PROCEDURE TRAY) ×2 IMPLANT
PENCIL BUTTON HOLSTER BLD 10FT (ELECTRODE) ×2 IMPLANT
PIN SAFETY STERILE (MISCELLANEOUS) ×2 IMPLANT
PUNCH BIOPSY DERMAL 4MM (MISCELLANEOUS) IMPLANT
SHEET MEDIUM DRAPE 40X70 STRL (DRAPES) ×2 IMPLANT
SLEEVE SCD COMPRESS KNEE MED (MISCELLANEOUS) ×2 IMPLANT
SPONGE LAP 18X18 RF (DISPOSABLE) ×5 IMPLANT
STAPLER VISISTAT 35W (STAPLE) ×1 IMPLANT
STOCKINETTE IMPERVIOUS LG (DRAPES) ×1 IMPLANT
STRIP CLOSURE SKIN 1/2X4 (GAUZE/BANDAGES/DRESSINGS) ×2 IMPLANT
SUT CHROMIC 4 0 PS 2 18 (SUTURE) ×4 IMPLANT
SUT ETHILON 2 0 FS 18 (SUTURE) ×3 IMPLANT
SUT MNCRL AB 4-0 PS2 18 (SUTURE) ×5 IMPLANT
SUT SILK 0 TIES 10X30 (SUTURE) ×1 IMPLANT
SUT SILK 2 0 SH (SUTURE) ×1 IMPLANT
SUT VIC AB 3-0 SH 27 (SUTURE) ×1
SUT VIC AB 3-0 SH 27X BRD (SUTURE) IMPLANT
SUT VICRYL 0 CT-2 (SUTURE) ×3 IMPLANT
SUT VICRYL 3-0 CR8 SH (SUTURE) ×2 IMPLANT
SUT VICRYL 4-0 PS2 18IN ABS (SUTURE) ×1 IMPLANT
SUT VICRYL AB 2 0 TIE (SUTURE) ×1 IMPLANT
SUT VICRYL AB 2 0 TIES (SUTURE)
SUT VLOC 180 0 24IN GS25 (SUTURE) ×1 IMPLANT
SYR 50ML LL SCALE MARK (SYRINGE) ×2 IMPLANT
SYR BULB IRRIGATION 50ML (SYRINGE) ×2 IMPLANT
SYR CONTROL 10ML LL (SYRINGE) ×2 IMPLANT
TAPE MEASURE VINYL STERILE (MISCELLANEOUS) IMPLANT
TISSUE EXPNDR FV FOURTE 500 (Prosthesis & Implant Plastic) ×2 IMPLANT
TOWEL GREEN STERILE FF (TOWEL DISPOSABLE) ×2 IMPLANT
TOWEL OR NON WOVEN STRL DISP B (DISPOSABLE) ×2 IMPLANT
TRAY DSU PREP LF (CUSTOM PROCEDURE TRAY) ×2 IMPLANT
TRAY FOLEY W/BAG SLVR 14FR LF (SET/KITS/TRAYS/PACK) IMPLANT
TUBE CONNECTING 20X1/4 (TUBING) ×2 IMPLANT
UNDERPAD 30X30 (UNDERPADS AND DIAPERS) ×4 IMPLANT
YANKAUER SUCT BULB TIP NO VENT (SUCTIONS) ×2 IMPLANT

## 2017-10-26 NOTE — Anesthesia Preprocedure Evaluation (Signed)
Anesthesia Evaluation  Patient identified by MRN, date of birth, ID band Patient awake    Reviewed: Allergy & Precautions, H&P , NPO status , Patient's Chart, lab work & pertinent test results, reviewed documented beta blocker date and time   Airway Mallampati: II  TM Distance: >3 FB Neck ROM: full    Dental no notable dental hx.    Pulmonary neg pulmonary ROS,    Pulmonary exam normal breath sounds clear to auscultation       Cardiovascular Exercise Tolerance: Good negative cardio ROS   Rhythm:regular Rate:Normal     Neuro/Psych negative neurological ROS  negative psych ROS   GI/Hepatic negative GI ROS, Neg liver ROS,   Endo/Other  Hypothyroidism   Renal/GU negative Renal ROS  negative genitourinary   Musculoskeletal   Abdominal   Peds negative pediatric ROS (+)  Hematology negative hematology ROS (+)   Anesthesia Other Findings   Reproductive/Obstetrics negative OB ROS                             Anesthesia Physical Anesthesia Plan  ASA: II  Anesthesia Plan: General   Post-op Pain Management: GA combined w/ Regional for post-op pain   Induction:   PONV Risk Score and Plan: 3 and Dexamethasone, Ondansetron and Treatment may vary due to age or medical condition  Airway Management Planned: Oral ETT and LMA  Additional Equipment:   Intra-op Plan:   Post-operative Plan: Extubation in OR  Informed Consent: I have reviewed the patients History and Physical, chart, labs and discussed the procedure including the risks, benefits and alternatives for the proposed anesthesia with the patient or authorized representative who has indicated his/her understanding and acceptance.   Dental Advisory Given  Plan Discussed with: CRNA, Anesthesiologist and Surgeon  Anesthesia Plan Comments:         Anesthesia Quick Evaluation

## 2017-10-26 NOTE — Progress Notes (Signed)
Pt requests to see hospital chaplain before DC home tomorrow 10/27/17. I spoke to Nicky Pugh at [facility] office, he plans to come see the pt in [facility] 2/daysurgery at 0700 before discharged home. Contact # for Marjory Lies [phone] (pager). Pt notified.

## 2017-10-26 NOTE — Progress Notes (Addendum)
No pre op labs needed per Dr Ambrose Pancoast

## 2017-10-26 NOTE — Interval H&P Note (Signed)
History and Physical Interval Note:  10/26/2017 10:46 AM  Kathryn Lucas  has presented today for surgery, with the diagnosis of LEFT BREAST CANCER  The various methods of treatment have been discussed with the patient and family. After consideration of risks, benefits and other options for treatment, the patient has consented to  Procedure(s): LEFT MASTECTOMY WITH SEED TARGETED  LEFT AXILLARY LYMPH NODE EXCISION AND LEFT SENTINEL LYMPH NODE BIOPSY (Left) LEFT BREAST RECONSTRUCTION WITH PLACEMENT OF TISSUE EXPANDER AND ALLODERM (Left) as a surgical intervention .  The patient's history has been reviewed, patient examined, no change in status, stable for surgery.  I have reviewed the patient's chart and labs.  Questions were answered to the patient's satisfaction.     Stark Klein

## 2017-10-26 NOTE — Anesthesia Procedure Notes (Signed)
Procedure Name: Intubation Date/Time: 10/26/2017 11:00 AM Performed by: Signe Colt, CRNA Pre-anesthesia Checklist: Patient identified, Emergency Drugs available, Suction available and Patient being monitored Patient Re-evaluated:Patient Re-evaluated prior to induction Oxygen Delivery Method: Circle system utilized Preoxygenation: Pre-oxygenation with 100% oxygen Induction Type: IV induction Ventilation: Mask ventilation without difficulty Laryngoscope Size: Mac and 3 Grade View: Grade I Tube type: Oral Tube size: 7.0 mm Number of attempts: 1 Airway Equipment and Method: Stylet and Oral airway Placement Confirmation: ETT inserted through vocal cords under direct vision,  positive ETCO2 and breath sounds checked- equal and bilateral Secured at: 21 cm Tube secured with: Tape Dental Injury: Teeth and Oropharynx as per pre-operative assessment

## 2017-10-26 NOTE — Anesthesia Procedure Notes (Signed)
Anesthesia Regional Block: Pectoralis block   Pre-Anesthetic Checklist: ,, timeout performed, Correct Patient, Correct Site, Correct Laterality, Correct Procedure, Correct Position, site marked, Risks and benefits discussed,  Surgical consent,  Pre-op evaluation,  At surgeon's request and post-op pain management  Laterality: Left  Prep: chloraprep       Needles:  Injection technique: Single-shot  Needle Type: Echogenic Stimulator Needle     Needle Length: 5cm  Needle Gauge: 22     Additional Needles:   Procedures:, nerve stimulator,,, ultrasound used (permanent image in chart),,,,  Narrative:  Start time: 10/26/2017 9:15 AM End time: 10/26/2017 9:20 AM Injection made incrementally with aspirations every 5 mL.  Performed by: Personally  Anesthesiologist: Janeece Riggers, MD  Additional Notes: Functioning IV was confirmed and monitors were applied.  A 4mm 22ga Arrow echogenic stimulator needle was used. Sterile prep and drape,hand hygiene and sterile gloves were used. Ultrasound guidance: relevant anatomy identified, needle position confirmed, local anesthetic spread visualized around nerve(s)., vascular puncture avoided.  Image printed for medical record. Negative aspiration and negative test dose prior to incremental administration of local anesthetic. The patient tolerated the procedure well.

## 2017-10-26 NOTE — Op Note (Signed)
Operative Note   DATE OF OPERATION: 8.13.19  LOCATION: [facility]-observation  SURGICAL DIVISION: Plastic Surgery  PREOPERATIVE DIAGNOSES:  1. Left breast cancer multicentric ER+  POSTOPERATIVE DIAGNOSES:  same  PROCEDURE:  1. Left breast reconstruction with tissue expander 2. Acellular dermis (Alloderm) for breast reconstruction 300 cm2  SURGEON: Irene Limbo MD MBA  ASSISTANT: none  ANESTHESIA:  General.   EBL: 150 ml for entire procedure  COMPLICATIONS: None immediate.   INDICATIONS FOR PROCEDURE:  The patient, Kathryn Lucas, is a 57 y.o. female born on [birth year], is here for immediate prepectoral reconstruction following nipple sparing mastectomy and SLN.   FINDINGS: Natrelle 133S-FV-13-T 500 ml tissue expander placed, initial fill volume 360 ml air. SN [id number]  DESCRIPTION OF PROCEDURE:  The patient was marked with the patient in the preoperative area to mark sternal notch, chest midline, anterior axillary lines and inframammary folds. The patient's operative site was prepped and draped in a sterile fashion. A time out was performed and all information was confirmed to be correct.   Following completed of mastectomy, the cavity was irrigated with solution containing Ancef, gentamicin, and bacitracin. Hemostasis was ensured. A 19 Fr drain was placed in subcutaneous position laterally and a 15 Fr drain placed along inframammary fold. Each secured to skin with 2-0 nylon. Cavity irrigated with Betadine. The tissue expander was prepared on back table prior in insertion. The expander was filled with air to374ml.Acellular dermis wasperforated anddraped over anterior surface expander. The ADM was then secured to itself over posterior surface of expander. Redundant folds acellular dermis excised so that the ADM lied flat without folds over air filled expander.The expander was secured tofascia over lateral sternal borderwith a 0 vicryl. Thelateral tab wasalso  secured to pectoralis muscle with 0-vicryl. The ADM was secured to pectoralis muscle and chest wall along inferior border at inframammary foldwith 0 V-lock suture.Laterally the mastectomy flap over posterior axillary line was advanced anteriorly and the subcutaneous tissue and superficial fascia was secured to pectoralis muscle and acellular dermis with 0-vicryl. Skin closure completedwith 3-0 vicryl in fascial layer and 4-0 vicryl in dermis. Skin closure completed with 4-0 monocryl subcuticular and tissue adhesive.  The mastectomy flap was redraped so that NAC was symmetric from midline and sternal notch. Tegaderm dressings applied followed bydry dressing,breast binder  The patient was allowed to wake from anesthesia, extubated and taken to the recovery room in satisfactory condition.   SPECIMENS: none  DRAINS: 15 and 19 Fr JP in left breast reconstruction  Irene Limbo, MD [facility] [phone], pin [phone]

## 2017-10-26 NOTE — Anesthesia Postprocedure Evaluation (Signed)
Anesthesia Post Note  Patient: Kathryn Lucas  Procedure(s) Performed: LEFT MASTECTOMY WITH SEED TARGETED  LEFT AXILLARY LYMPH NODE EXCISION AND LEFT SENTINEL LYMPH NODE BIOPSY (Left Breast) LEFT BREAST RECONSTRUCTION WITH PLACEMENT OF TISSUE EXPANDER AND ALLODERM (Left Breast)     Patient location during evaluation: PACU Anesthesia Type: General Level of consciousness: awake and alert and oriented Pain management: pain level controlled Vital Signs Assessment: post-procedure vital signs reviewed and stable Respiratory status: spontaneous breathing, nonlabored ventilation and respiratory function stable Cardiovascular status: blood pressure returned to baseline and stable Postop Assessment: no apparent nausea or vomiting Anesthetic complications: no    Last Vitals:  Vitals:   10/26/17 1419 10/26/17 1430  BP:  114/70  Pulse:  71  Resp:  19  Temp:    SpO2: 100% 97%    Last Pain:  Vitals:   10/26/17 1420  TempSrc:   PainSc: 4                  Isaack Preble A.

## 2017-10-26 NOTE — Interval H&P Note (Signed)
History and Physical Interval Note:  10/26/2017 8:28 AM  Kathryn Lucas  has presented today for surgery, with the diagnosis of LEFT BREAST CANCER  The various methods of treatment have been discussed with the patient and family. After consideration of risks, benefits and other options for treatment, the patient has consented to  Procedure(s): LEFT MASTECTOMY WITH SEED TARGETED  LEFT AXILLARY LYMPH NODE EXCISION AND LEFT SENTINEL LYMPH NODE BIOPSY (Left) LEFT BREAST RECONSTRUCTION WITH PLACEMENT OF TISSUE EXPANDER AND ALLODERM (Left) as a surgical intervention .  The patient's history has been reviewed, patient examined, no change in status, stable for surgery.  I have reviewed the patient's chart and labs.  Questions were answered to the patient's satisfaction.     Feliciano Wynter

## 2017-10-26 NOTE — Op Note (Signed)
Left nipple sparing Mastectomy with seed targeted deep axillary lymph node biopsy and Sentinel Node Biopsy Procedure Note  Indications: This patient presents with history of left breast cancer  Pre-operative Diagnosis: left breast cancer, multifocal cT2N0M0, lower outer quadrant, receptors +/+/-  Post-operative Diagnosis: same  Surgeon: Stark Klein   Anesthesia: General endotracheal anesthesia and pectoral block  ASA Class: 2  Procedure Details  The patient was seen in the Holding Room. The risks, benefits, complications, treatment options, and expected outcomes were discussed with the patient. The possibilities of reaction to medication, pulmonary aspiration, bleeding, infection, the need for additional procedures, failure to diagnose a condition, and creating a complication requiring transfusion or operation were discussed with the patient. The patient concurred with the proposed plan, giving informed consent.  The site of surgery properly noted/marked. The patient was taken to Operating Room # 7, identified as Kathryn Lucas and the procedure verified as Left nipple sparing Mastectomy with right seed targeted lymph node biopsy and Sentinel Node Biopsy. A Time Out was held and the above information confirmed.    After induction of anesthesia, the left arm, breast, and chest were prepped and draped in standard fashion.   The borders of the breast were identified and marked.  The lateral breast had some skin tethering at 3:30.  A transversely oriented lateral elliptical incision was made.  Mastectomy hooks were used to provide elevation of the skin edges, and the cautery was used to create the mastectomy flaps.   Since there was a seed in a lymph node, the sentinel nodes were addressed first.  Using a hand-held gamma probe, axillary sentinel nodes were identified. The one with the seed was identified first.  Cautery and clips were used to take the node.  It was a deep axillary node.  It was also  hot with the technetium injection.  An additional deep level 2 axillary sentinel node was removed and submitted to pathology.  The findings are below.  The lymphovascular channels were clipped with metal clips.       The remainder of the mastectomy was performed.  The dissection was taken down to the fascia of the pectoralis major.  The penetrating vessels were clipped as needed.  The superior flap was taken medially to the lateral sternal border, superiorly to the inferior border of the clavicle.  The inferior flap was similarly created, inferiorly to the inframammary fold and laterally to the border of the latissimus.  The breast was taken off including the pectoralis fascia and the axillary tail marked.    The wound was irrigated.  Hemostasis was achieved with cautery.  The patient was left with Dr. Iran Planas to perform reconstruction.     Findings: grossly clear surgical margins  Estimated Blood Loss: <50 mL          Drains: per Dr. Iran Planas                Specimens: Left breast and two axillary sentinel nodes, hottest was the one with the seed.  Cps >900. SLN #2 cps 790.  Background count was <10 cps.           Complications:  None; patient tolerated the procedure well.         Disposition: PACU - hemodynamically stable.         Condition: stable

## 2017-10-26 NOTE — Transfer of Care (Signed)
Immediate Anesthesia Transfer of Care Note  Patient: Kathryn Lucas  Procedure(s) Performed: LEFT MASTECTOMY WITH SEED TARGETED  LEFT AXILLARY LYMPH NODE EXCISION AND LEFT SENTINEL LYMPH NODE BIOPSY (Left Breast) LEFT BREAST RECONSTRUCTION WITH PLACEMENT OF TISSUE EXPANDER AND ALLODERM (Left Breast)  Patient Location: PACU  Anesthesia Type:GA combined with regional for post-op pain  Level of Consciousness: sedated  Airway & Oxygen Therapy: Patient Spontanous Breathing and Patient connected to face mask oxygen  Post-op Assessment: Report given to RN and Post -op Vital signs reviewed and stable  Post vital signs: Reviewed and stable  Last Vitals:  Vitals Value Taken Time  BP 114/56 10/26/2017  1:48 PM  Temp    Pulse 71 10/26/2017  1:50 PM  Resp 15 10/26/2017  1:50 PM  SpO2 100 % 10/26/2017  1:50 PM  Vitals shown include unvalidated device data.  Last Pain:  Vitals:   10/26/17 0826  TempSrc: Oral      Patients Stated Pain Goal: 0 (03/54/65 6812)  Complications: No apparent anesthesia complications

## 2017-10-26 NOTE — Progress Notes (Signed)
Assisted Dr. Oddono with left, ultrasound guided, pectoralis block. Side rails up, monitors on throughout procedure. See vital signs in flow sheet. Tolerated Procedure well. °

## 2017-10-27 DIAGNOSIS — C50412 Malignant neoplasm of upper-outer quadrant of left female breast: Secondary | ICD-10-CM | POA: Diagnosis not present

## 2017-10-27 DIAGNOSIS — Z170 Estrogen receptor positive status [ER+]: Secondary | ICD-10-CM | POA: Diagnosis not present

## 2017-10-27 DIAGNOSIS — C773 Secondary and unspecified malignant neoplasm of axilla and upper limb lymph nodes: Secondary | ICD-10-CM | POA: Diagnosis not present

## 2017-10-27 NOTE — Discharge Summary (Signed)
Physician Discharge Summary  Patient ID: Kathryn Lucas MRN: [id number] DOB/AGE: 1960-08-21 57 y.o.  Admit date: 10/26/2017 Discharge date: 10/27/2017  Admission Diagnoses: Left breast cancer  Discharge Diagnoses:  Active Problems:   Breast cancer, left breast [facility])   Discharged Condition: stable  Hospital Course: Post operatively patient did well with minimal pain, ambulatory in room and tolerating diet. Instructed on drain and compression.  Treatments: surgery: left nipple sparing mastectomy with SLN, tissue expander acellular dermis reconstruction 8.13.19  Discharge Exam: Blood pressure 92/60, pulse 62, temperature 97.9 F (36.6 C), resp. rate 16, height 5\' 11"  (1.803 m), weight 82 kg, SpO2 94 %. Incision/Wound: Chest soft developing ecchymoses drains serosanguinous, Tegaderms in place  Disposition: Discharge disposition: 01-Home or Self Care       Discharge Instructions    Call MD for:  redness, tenderness, or signs of infection (pain, swelling, bleeding, redness, odor or green/yellow discharge around incision site)   Complete by:  As directed    Call MD for:  temperature >100.5   Complete by:  As directed    Discharge instructions   Complete by:  As directed    Ok to remove dressings and shower am 8.15.19. Soap and water ok, pat Tegaderms dry. Do not remove tegaderms. No creams or ointments over incisions. Do not let drains dangle in shower, attach to lanyard or similar.Strip and record drains twice daily and bring log to clinic visit.  Breast binder or soft compression bra all other times.  Ok to raise arms above shoulders for bathing and dressing.  No house yard work or exercise until cleared by MD.   Received all Rx preoperatively.   Driving Restrictions   Complete by:  As directed    No driving for 2 weeks then no driving if taking narcotics   Lifting restrictions   Complete by:  As directed    No lifting > 5 lbs until cleared by MD   Resume previous diet    Complete by:  As directed       Follow-up Information    Stark Klein, MD In 2 weeks.   Specialty:  General Surgery Contact information: [address] [place] [address] [phone]        Irene Limbo, MD In 1 week.   Specialty:  Plastic Surgery Why:  as scheduled Contact information: [place] [address] [phone]           Signed: Irene Limbo 10/27/2017, 7:14 AM

## 2017-10-27 NOTE — Discharge Instructions (Signed)
°  Post Anesthesia Home Care Instructions ° °Activity: °Get plenty of rest for the remainder of the day. A responsible individual must stay with you for 24 hours following the procedure.  °For the next 24 hours, DO NOT: °-Drive a car °-Operate machinery °-Drink alcoholic beverages °-Take any medication unless instructed by your physician °-Make any legal decisions or sign important papers. ° °Meals: °Start with liquid foods such as gelatin or soup. Progress to regular foods as tolerated. Avoid greasy, spicy, heavy foods. If nausea and/or vomiting occur, drink only clear liquids until the nausea and/or vomiting subsides. Call your physician if vomiting continues. ° °Special Instructions/Symptoms: °Your throat may feel dry or sore from the anesthesia or the breathing tube placed in your throat during surgery. If this causes discomfort, gargle with warm salt water. The discomfort should disappear within 24 hours. ° °If you had a scopolamine patch placed behind your ear for the management of post- operative nausea and/or vomiting: ° °1. The medication in the patch is effective for 72 hours, after which it should be removed.  Wrap patch in a tissue and discard in the trash. Wash hands thoroughly with soap and water. °2. You may remove the patch earlier than 72 hours if you experience unpleasant side effects which may include dry mouth, dizziness or visual disturbances. °3. Avoid touching the patch. Wash your hands with soap and water after contact with the patch. °  ° ° ° ° °JP Drain Totals °· Bring this sheet to all of your post-operative appointments while you have your drains. °· Please measure your drains by CC's or ML's. °· Make sure you drain and measure your JP Drains 2 or 3 times per day. °· At the end of each day, add up totals for the left side and add up totals for the right side. °   ( 9 am )     ( 3 pm )        ( 9 pm )                °Date L  R  L  R  L  R  Total L/R  °               °               °        °               °               °               °               °               °               °               °               °               ° ° °

## 2017-10-28 ENCOUNTER — Encounter (HOSPITAL_BASED_OUTPATIENT_CLINIC_OR_DEPARTMENT_OTHER): Payer: Self-pay | Admitting: General Surgery

## 2017-10-28 ENCOUNTER — Telehealth: Payer: Self-pay | Admitting: General Surgery

## 2017-10-28 NOTE — Telephone Encounter (Signed)
1/2 LN positive.  Margins negative.  Sent to oncology.  Will also need radiation referral.

## 2017-11-01 ENCOUNTER — Telehealth: Payer: Self-pay | Admitting: *Deleted

## 2017-11-01 NOTE — Telephone Encounter (Signed)
Ordered mammaprint per Dr. Lindi Adie. Faxed requisition to pathology and [facility] and confirmed receipt.

## 2017-11-02 ENCOUNTER — Encounter: Payer: Self-pay | Admitting: *Deleted

## 2017-11-02 ENCOUNTER — Inpatient Hospital Stay: Payer: Commercial Managed Care - PPO | Attending: Hematology and Oncology | Admitting: Hematology and Oncology

## 2017-11-02 DIAGNOSIS — Z9012 Acquired absence of left breast and nipple: Secondary | ICD-10-CM | POA: Diagnosis not present

## 2017-11-02 DIAGNOSIS — Z170 Estrogen receptor positive status [ER+]: Secondary | ICD-10-CM

## 2017-11-02 DIAGNOSIS — Z923 Personal history of irradiation: Secondary | ICD-10-CM

## 2017-11-02 DIAGNOSIS — Z79811 Long term (current) use of aromatase inhibitors: Secondary | ICD-10-CM | POA: Diagnosis not present

## 2017-11-02 DIAGNOSIS — C50512 Malignant neoplasm of lower-outer quadrant of left female breast: Secondary | ICD-10-CM | POA: Insufficient documentation

## 2017-11-02 NOTE — Progress Notes (Signed)
Patient Care Team: Kelton Pillar, MD as PCP - General (Family Medicine)  DIAGNOSIS:  Encounter Diagnosis  Name Primary?  . Malignant neoplasm of lower-outer quadrant of left breast of female, estrogen receptor positive ([place])     SUMMARY OF ONCOLOGIC HISTORY:   Malignant neoplasm of lower-outer quadrant of left breast of female, estrogen receptor positive ([place])   06/11/2016 Mammogram    Palpable left breast masses 3:00 position: 2.2 cm; 5:30 position: 2.5 cm; 6:30 position: 0.7 cm    06/19/2016 Initial Diagnosis    Left breast biopsy 3:30: IDC with DCIS grade 1, ER 90%, PR 50%, Ki-67 15%, HER-2 negative ratio 1.13; biopsy 5:30 position: IDC grade 1    07/13/2016 Breast MRI    Large area of abnormal enhancement lower inner and lower outer quadrants left breast spanning 9 cm x 6.4 cm x 5.3 cm, no abnormal enlarged lymph nodes; T3 N0 stage II a (New AJCC staging)     07/15/2016 -  Anti-estrogen oral therapy    Neoadjuvant anastrozole 1 mg daily    07/17/2016 Oncotype testing    Testing done on the biopsy: Oncotype DX score 22, intermediate risk    02/02/2017 Breast MRI    Left breast multicentric disease unchanged measuring 2.7 x 1.6 cm.  Mass in the non-mass enhancement are also not significantly changed measuring 6.2 x 2.4 cm. new enhancing mass within the outer right breast 7 mm which could be fat necrosis or inclusion cyst     02/09/2017 Imaging    Ultrasound of the right breast lesion noted on MRI: No sonographic finding corresponds to the abnormality noted on MRI    10/26/2017 Surgery    Left mastectomy: IDC grade 1, 2 foci largest spans 8.5 cm, intermediate grade DCIS, lymphovascular invasion identified, perineural invasion identified, 1/2 lymph nodes positive with extracapsular extension, ER 9200%, PR 5 to 50%, HER-2 negative, Ki-67 10 to 15%, T3N1A    11/02/2017 Cancer Staging    Staging form: Breast, AJCC 8th Edition - Pathologic: No Stage Recommended (ypT3, pN1a, cM0, G1,  ER+, PR+, HER2-) - Signed by Nicholas Lose, MD on 11/02/2017     CHIEF COMPLIANT: Follow-up after recent surgery  INTERVAL HISTORY: Kathryn Lucas is a 72-year with above-mentioned history of left breast cancer who underwent neoadjuvant antiestrogen therapy but did not have a good response so she underwent a mastectomy and is here today to discuss the pathology report.  She is healing recovering very well from the recent surgery.  REVIEW OF SYSTEMS:   Constitutional: Denies fevers, chills or abnormal weight loss Eyes: Denies blurriness of vision Ears, nose, mouth, throat, and face: Denies mucositis or sore throat Respiratory: Denies cough, dyspnea or wheezes Cardiovascular: Denies palpitation, chest discomfort Gastrointestinal:  Denies nausea, heartburn or change in bowel habits Skin: Denies abnormal skin rashes Lymphatics: Denies new lymphadenopathy or easy bruising Neurological:Denies numbness, tingling or new weaknesses Behavioral/Psych: Mood is stable, no new changes  Extremities: No lower extremity edema  All other systems were reviewed with the patient and are negative.  I have reviewed the past medical history, past surgical history, social history and family history with the patient and they are unchanged from previous note.  ALLERGIES:  has No Known Allergies.  MEDICATIONS:  Current Outpatient Medications  Medication Sig Dispense Refill  . anastrozole (ARIMIDEX) 1 MG tablet Take 1 tablet (1 mg total) by mouth daily. 90 tablet 3  . fluticasone (FLONASE) 50 MCG/ACT nasal spray Place into the nose.    Marland Kitchen  levothyroxine (SYNTHROID, LEVOTHROID) 175 MCG tablet Take 112 mcg by mouth.      No current facility-administered medications for this visit.     PHYSICAL EXAMINATION: ECOG PERFORMANCE STATUS: 1 - Symptomatic but completely ambulatory  Vitals:   11/02/17 1137  BP: 121/81  Pulse: 81  Resp: 17  Temp: 98.6 F (37 C)  SpO2: 99%   Filed Weights   11/02/17 1137    Weight: 178 lb 3.2 oz (80.8 kg)    GENERAL:alert, no distress and comfortable SKIN: skin color, texture, turgor are normal, no rashes or significant lesions EYES: normal, Conjunctiva are pink and non-injected, sclera clear OROPHARYNX:no exudate, no erythema and lips, buccal mucosa, and tongue normal  NECK: supple, thyroid normal size, non-tender, without nodularity LYMPH:  no palpable lymphadenopathy in the cervical, axillary or inguinal LUNGS: clear to auscultation and percussion with normal breathing effort HEART: regular rate & rhythm and no murmurs and no lower extremity edema ABDOMEN:abdomen soft, non-tender and normal bowel sounds MUSCULOSKELETAL:no cyanosis of digits and no clubbing  NEURO: alert & oriented x 3 with fluent speech, no focal motor/sensory deficits EXTREMITIES: No lower extremity edema   ASSESSMENT & PLAN:  Malignant neoplasm of lower-outer quadrant of left breast of female, estrogen receptor positive ([place]) 06/19/2016 Left breast biopsy 3:30: IDC with DCIS grade 1, ER 90%, PR 50%, Ki-67 15%, HER-2 negative ratio 1.13; biopsy 5:30 position: IDC grade 1  Breast MRI 07/13/2016 Large area of abnormal enhancement lower inner and lower outer quadrants left breast spanning 9 cm x 6.4 cm x 5.3 cm, no abnormal enlarged lymph nodes; T3 N0 stage II a (New AJCC staging)  Oncotype DXscore 22, intermediate risk, chemotherapy not felt to have significant benefit.  Treatment plan: 1. Antiestrogen therapy with anastrozole 1 mg daily started 07/15/2016 2. followed by mastectomy 3. Followed by adjuvant radiation 4. Followed by adjuvant antiestrogen therapy -------------------------------------------------------------------- 10/26/17: Left mastectomy: IDC grade 1, 2 foci largest spans 8.5 cm, intermediate grade DCIS, lymphovascular invasion identified, perineural invasion identified, 1/2 lymph nodes positive with extracapsular extension, ER 9200%, PR 5 to 50%, HER-2 negative,  Ki-67 10 to 15%, T3N1A  Pathology counseling: I discussed the final pathology report of the patient provided  a copy of this report. I discussed the margins as well as lymph node surgeries. We also discussed the final staging along with previously performed ER/PR and HER-2/neu testing.  I recommended that we obtain Mammaprint testing to determine if she needs chemotherapy.  Based on that we will refer her to radiation oncology for adjuvant radiation and subsequently she will come back to see me for restarting her antiestrogen therapy.  I will discuss with her about Natalee clinical trial.    No orders of the defined types were placed in this encounter.  The patient has a good understanding of the overall plan. she agrees with it. she will call with any problems that may develop before the next visit here.   Harriette Ohara, MD 11/02/17

## 2017-11-02 NOTE — Assessment & Plan Note (Signed)
06/19/2016 Left breast biopsy 3:30: IDC with DCIS grade 1, ER 90%, PR 50%, Ki-67 15%, HER-2 negative ratio 1.13; biopsy 5:30 position: IDC grade 1  Breast MRI 07/13/2016 Large area of abnormal enhancement lower inner and lower outer quadrants left breast spanning 9 cm x 6.4 cm x 5.3 cm, no abnormal enlarged lymph nodes; T3 N0 stage II a (New AJCC staging)  Oncotype DXscore 22, intermediate risk, chemotherapy not felt to have significant benefit.  Treatment plan: 1. Antiestrogen therapy with anastrozole 1 mg daily started 07/15/2016 2. followed by mastectomy 3. Followed by adjuvant radiation 4. Followed by adjuvant antiestrogen therapy -------------------------------------------------------------------- 10/26/17: Left mastectomy: IDC grade 1, 2 foci largest spans 8.5 cm, intermediate grade DCIS, lymphovascular invasion identified, perineural invasion identified, 1/2 lymph nodes positive with extracapsular extension, ER 9200%, PR 5 to 50%, HER-2 negative, Ki-67 10 to 15%, T3N1A  Pathology counseling: I discussed the final pathology report of the patient provided  a copy of this report. I discussed the margins as well as lymph node surgeries. We also discussed the final staging along with previously performed ER/PR and HER-2/neu testing.  I recommended that we obtain Mammaprint testing to determine if she needs chemotherapy.  Based on that we will refer her to radiation oncology for adjuvant radiation and subsequently she will come back to see me for restarting her antiestrogen therapy.  I will discuss with her about Natalee clinical trial.

## 2017-11-03 ENCOUNTER — Telehealth: Payer: Self-pay | Admitting: Hematology and Oncology

## 2017-11-03 NOTE — Telephone Encounter (Signed)
Per 8/20 los, no new orders.

## 2017-11-11 ENCOUNTER — Encounter: Payer: Self-pay | Admitting: Radiation Oncology

## 2017-11-11 ENCOUNTER — Telehealth: Payer: Self-pay | Admitting: *Deleted

## 2017-11-11 ENCOUNTER — Encounter (HOSPITAL_COMMUNITY): Payer: Self-pay | Admitting: Hematology and Oncology

## 2017-11-11 ENCOUNTER — Other Ambulatory Visit: Payer: Self-pay | Admitting: *Deleted

## 2017-11-11 DIAGNOSIS — C50512 Malignant neoplasm of lower-outer quadrant of left female breast: Secondary | ICD-10-CM

## 2017-11-11 DIAGNOSIS — Z170 Estrogen receptor positive status [ER+]: Secondary | ICD-10-CM

## 2017-11-11 NOTE — Telephone Encounter (Signed)
Left message for a return phone call to inform her of the low risk mammaprint results.

## 2017-11-11 NOTE — Telephone Encounter (Signed)
Receive mammaprint results of low risk. Physician team notified. Referral made for radiation oncology.

## 2017-11-12 NOTE — Progress Notes (Signed)
Location of Breast Cancer: Malignant neoplasm of lower outer quadrant of left breast of female, estrogen receptor positive.   Did patient present with symptoms (if so, please note symptoms) or was this found on screening mammography?: Palpable left breast mass.  Breast MRI 08/11/2017: 1. Spiculated mass in the outer left breast extending to the skin is larger, more solid appearing and more intensely enhancing when compared to the prior exam. In addition, a 4 mm satellite nodule medial to this mass is also larger when compared to the prior exam. 2. The additional mass involving the lower anterior third of the left breast as well as the linear nodular enhancement connecting the 2 dominant masses is overall stable in size in appearance when compared to prior exam. 3. Left axillary lymph node measures larger when compared to the prior exams concerning for nodal metastases. 4. Decreased size of small enhancing mass in the outer right breast, possibly resolving fat necrosis or small lymph node. Attention on follow-up recommended.  Breast MRI 02/02/2017: Left breast multicentric disease unchanged measuring 2.7 x 1.6 cm.  Mass in the non-mass enhancement are also not significantly changed measuring 6.2 x 2.4 cm. new enhancing mass within the outer right breast 7 mm which could be fat necrosis or inclusion cyst.  Breast MRI 07/13/2016: Large area of abnormal enhancement lower inner and lower outer quadrants left breast spanning 9 cm x 6.4 cm x 5.3 cm, no abnormal enlarged lymph nodes.   Mammogram 06/11/2016: Palpable left breast mass, 3:00 position, 2.2 cm; 5:30 position 2.5 cm, 6:30 position 0.7 cm.  Histology per Pathology Report: Left Breast Mastectomy 10/26/2017  Histology per Pathology Report: Left Breast 06/19/16   Receptor Status: ER(+ 90%), PR (+ 50%), Her2-neu (-), Ki-67(15%)  Past/Anticipated interventions by surgeon, if any: Dr. Barry Dienes 10/26/2017: Left mastectomy with breast reconstruction with  placement of tissue expander and alloderm.  Past/Anticipated interventions by medical oncology, if any: Chemotherapy  Dr. Lindi Adie History of treatment -Antiestrogen oral therapy started 07/15/2016- neoadjuvant anastrozole 1 mg daily. -Oncotype Testing 07/17/2016:Oncotype DX score 22, intermediate risk.  Dr. Lindi Adie 11/02/2017 -I recommended that we obtain Mammaprint testing to determine if she needs chemotherapy.  Based on that we will refer her to radiation oncology for adjuvant radiation and subsequently she will come back to see me for restarting her antiestrogen therapy.  Results of Mammaprint: Low risk  Lymphedema issues, if any:  No  Pain issues, if any:  No  BP (!) 111/57 (BP Location: Right Arm, Patient Position: Sitting)   Pulse 85   Temp 98.3 F (36.8 C) (Oral)   Resp 18   Ht '5\' 11"'$  (1.803 m)   Wt 181 lb 3.2 oz (82.2 kg)   SpO2 98%   BMI 25.27 kg/m    Wt Readings from Last 3 Encounters:  11/16/17 181 lb 3.2 oz (82.2 kg)  11/02/17 178 lb 3.2 oz (80.8 kg)  10/26/17 180 lb 12.8 oz (82 kg)    SAFETY ISSUES:  Prior radiation? No  Pacemaker/ICD? No  Possible current pregnancy? No  Is the patient on methotrexate? No  Current Complaints / other details:   -Return to work this week    Cori Razor, RN 11/12/2017,12:42 PM

## 2017-11-16 ENCOUNTER — Ambulatory Visit
Admission: RE | Admit: 2017-11-16 | Discharge: 2017-11-16 | Disposition: A | Discharge status: Home / Self Care | Payer: Commercial Managed Care - PPO | Source: Ambulatory Visit | Attending: Radiation Oncology | Admitting: Radiation Oncology

## 2017-11-16 ENCOUNTER — Other Ambulatory Visit: Payer: Self-pay

## 2017-11-16 ENCOUNTER — Encounter: Payer: Self-pay | Admitting: Radiation Oncology

## 2017-11-16 VITALS — BP 111/57 | HR 85 | Temp 98.3°F | Resp 18 | Ht 71.0 in | Wt 181.2 lb

## 2017-11-16 DIAGNOSIS — Z170 Estrogen receptor positive status [ER+]: Secondary | ICD-10-CM | POA: Insufficient documentation

## 2017-11-16 DIAGNOSIS — Z923 Personal history of irradiation: Secondary | ICD-10-CM | POA: Insufficient documentation

## 2017-11-16 DIAGNOSIS — C773 Secondary and unspecified malignant neoplasm of axilla and upper limb lymph nodes: Secondary | ICD-10-CM | POA: Diagnosis not present

## 2017-11-16 DIAGNOSIS — Z9012 Acquired absence of left breast and nipple: Secondary | ICD-10-CM | POA: Insufficient documentation

## 2017-11-16 DIAGNOSIS — E039 Hypothyroidism, unspecified: Secondary | ICD-10-CM | POA: Diagnosis not present

## 2017-11-16 DIAGNOSIS — Z79899 Other long term (current) drug therapy: Secondary | ICD-10-CM | POA: Insufficient documentation

## 2017-11-16 DIAGNOSIS — C779 Secondary and unspecified malignant neoplasm of lymph node, unspecified: Secondary | ICD-10-CM | POA: Insufficient documentation

## 2017-11-16 DIAGNOSIS — C50512 Malignant neoplasm of lower-outer quadrant of left female breast: Secondary | ICD-10-CM | POA: Insufficient documentation

## 2017-11-16 DIAGNOSIS — Z79811 Long term (current) use of aromatase inhibitors: Secondary | ICD-10-CM | POA: Diagnosis not present

## 2017-11-16 NOTE — Progress Notes (Signed)
Radiation Oncology         ([phone]) [phone] ________________________________  Name: Kathryn Lucas        MRN: [id number]  Date of Service: 11/16/2017 DOB: 1960-10-06  WG:NFAOZHY, Margaretha Sheffield, MD  Nicholas Lose, MD     REFERRING PHYSICIAN: Nicholas Lose, MD   DIAGNOSIS: The encounter diagnosis was Malignant neoplasm of lower-outer quadrant of left breast of female, estrogen receptor positive ([place]).   HISTORY OF PRESENT ILLNESS: Kathryn Lucas is a 57 y.o. female seen at the request of Dr. Lindi Adie for a diagnosis of left breast cancer. The patient palpated an area in the left breast which prompted diagnostic work up. Her Mammogram revealed three separate lesions, 2.2 cm, 2.5 cm, and 7 cm in March 2018. She underwent a biopsy on 06/19/17 which revealed a grade 1 invasive ductal carcinoma, ER/PR positive, HER2 negative, and Ki 67 of 15%. She had an MRI that revealed enhancement measuring 9 x 6.4 x 5.3 cm and she began neoadjuvant hormone therapy. She had an oncotype score at that time that was 22. She has been followed and had improvement in disease by MRI in November 2018, and no ultrasound correlate at that time. She ultimately underwent resection on 10/27/27 with left nipple sparing mastectomy and sentinel node biopsy that revealed a grade 1 invasive ductal carcinoma with 2 foci, the largest spanning 8.5 cm, as well as intermediate risk DCIS. There was LVSI and perineural invasion noted. One of the two nodes sampled contained metastatic disease with extracapsular extension. She had a tissue expander placed by Dr. Iran Planas. Mammaprint was ordered on her tumor and was low risk. She will continue antiestrogen therapy. She comes today to discuss options of adjuvant radiotherapy. She is due to see Dr. Iran Planas this Thursday.     PREVIOUS RADIATION THERAPY: No   PAST MEDICAL HISTORY:  Past Medical History:  Diagnosis Date  . Cancer ([place]) 09/2017   left breast cancer  . Hypothyroidism   . Thyroid disease           PAST SURGICAL HISTORY: Past Surgical History:  Procedure Laterality Date  . BREAST RECONSTRUCTION WITH PLACEMENT OF TISSUE EXPANDER AND ALLODERM Left 10/26/2017   Procedure: LEFT BREAST RECONSTRUCTION WITH PLACEMENT OF TISSUE EXPANDER AND ALLODERM;  Surgeon: Irene Limbo, MD;  Location: [facility];  Service: Plastics;  Laterality: Left;  Marland Kitchen MASTECTOMY WITH RADIOACTIVE SEED GUIDED EXCISION AND AXILLARY SENTINEL LYMPH NODE BIOPSY Left 10/26/2017   Procedure: LEFT MASTECTOMY WITH SEED TARGETED  LEFT AXILLARY LYMPH NODE EXCISION AND LEFT SENTINEL LYMPH NODE BIOPSY;  Surgeon: Stark Klein, MD;  Location: [facility];  Service: General;  Laterality: Left;  . TONSILLECTOMY    . WISDOM TOOTH EXTRACTION       FAMILY HISTORY:  Family History  Problem Relation Age of Onset  . Breast cancer Mother      SOCIAL HISTORY:  reports that she has never smoked. She has never used smokeless tobacco. She reports that she drinks alcohol. She reports that she does not use drugs.   ALLERGIES: Patient has no known allergies.   MEDICATIONS:  Current Outpatient Medications  Medication Sig Dispense Refill  . anastrozole (ARIMIDEX) 1 MG tablet Take 1 tablet (1 mg total) by mouth daily. 90 tablet 3  . levothyroxine (SYNTHROID, LEVOTHROID) 175 MCG tablet Take 112 mcg by mouth.     . fluticasone (FLONASE) 50 MCG/ACT nasal spray Place into the nose.     No current facility-administered medications for this  encounter.      REVIEW OF SYSTEMS: On review of systems, the patient reports that she is doing well overall. She denies any chest pain, shortness of breath, cough, fevers, chills, night sweats, unintended weight changes. She denies any bowel or bladder disturbances, and denies abdominal pain, nausea or vomiting. She denies any new musculoskeletal or joint aches or pains. A complete review of systems is obtained and is otherwise negative.     PHYSICAL EXAM:  Wt  Readings from Last 3 Encounters:  11/16/17 181 lb 3.2 oz (82.2 kg)  11/02/17 178 lb 3.2 oz (80.8 kg)  10/26/17 180 lb 12.8 oz (82 kg)   Temp Readings from Last 3 Encounters:  11/16/17 98.3 F (36.8 C) (Oral)  11/02/17 98.6 F (37 C) (Oral)  10/27/17 97.9 F (36.6 C)   BP Readings from Last 3 Encounters:  11/16/17 (!) 111/57  11/02/17 121/81  10/27/17 105/71   Pulse Readings from Last 3 Encounters:  11/16/17 85  11/02/17 81  10/27/17 62   Pain Assessment Pain Score: 0-No pain/10  In general this is a well appearing caucasian female in no acute distress. She is alert and oriented x4 and appropriate throughout the examination. Cardiopulmonary assessment is negative for acute distress and she exhibits normal effort. Her left breast incision is healing well without erythema. Her in situ expander is noted, and there is no seroma noted.   ECOG = 1  0 - Asymptomatic (Fully active, able to carry on all predisease activities without restriction)  1 - Symptomatic but completely ambulatory (Restricted in physically strenuous activity but ambulatory and able to carry out work of a light or sedentary nature. For example, light housework, office work)  2 - Symptomatic, <50% in bed during the day (Ambulatory and capable of all self care but unable to carry out any work activities. Up and about more than 50% of waking hours)  3 - Symptomatic, >50% in bed, but not bedbound (Capable of only limited self-care, confined to bed or chair 50% or more of waking hours)  4 - Bedbound (Completely disabled. Cannot carry on any self-care. Totally confined to bed or chair)  5 - Death   Eustace Pen MM, Creech RH, Tormey DC, et al. [phone]). "Toxicity and response criteria of the [facility]". [place] Oncol. 5 (6): 649-55    LABORATORY DATA:  No results found for: WBC, HGB, HCT, MCV, PLT No results found for: NA, K, CL, CO2 No results found for: ALT, AST, GGT, ALKPHOS, BILITOT      RADIOGRAPHY:  Nm Sentinel Node Inj-no Rpt (breast)  Result Date: 10/26/2017 Sulfur colloid was injected by the nuclear medicine technologist for melanoma sentinel node.   Mm Breast Surgical Specimen  Result Date: 10/26/2017 CLINICAL DATA:  Evaluate surgical specimen following radioactive seed localization of LEFT axillary lymph node and excision EXAM: SPECIMEN RADIOGRAPH OF THE LEFT AXILLA COMPARISON:  Previous exam(s). FINDINGS: Status post excision of the LEFT axilla. The radioactive seed and biopsy marker clip are present and completely intact. IMPRESSION: Specimen radiograph of the LEFT axilla. Electronically Signed   By: Margarette Canada M.D.   On: 10/26/2017 12:01   [place] Radioactive Seed Loc  Result Date: 10/25/2017 CLINICAL DATA:  Pre left mastectomy localization of a recently biopsied left axillary lymph node with discordant benign biopsy results. Recently diagnosed left breast cancer. EXAM: ULTRASOUND GUIDED RADIOACTIVE SEED LOCALIZATION OF A LEFT AXILLARY LYMPH NODE COMPARISON:  Previous exam(s). FINDINGS: Patient presents for radioactive seed  localization prior to left mastectomy and targeted left axillary lymph node dissection. I met with the patient and we discussed the procedure of seed localization including benefits and alternatives. We discussed the high likelihood of a successful procedure. We discussed the risks of the procedure including infection, bleeding, tissue injury and further surgery. We discussed the low dose of radioactivity involved in the procedure. Informed, written consent was given. The usual time-out protocol was performed immediately prior to the procedure. Using ultrasound guidance, sterile technique, 1% lidocaine and an I-125 radioactive seed, the recently biopsied abnormal appearing left axillary lymph node containing a spiral shaped HydroMARK biopsy marker clip was localized using a caudal approach. The follow-up mammogram images confirm the seed in the expected  location and were marked for Dr. Barry Dienes. Follow-up survey of the patient confirms presence of the radioactive seed. Order number of I-125 seed:  [id number]. Total activity:  0.248 mCi reference Date: 10/12/2017 The patient tolerated the procedure well and was released from the [facility]. She was given instructions regarding seed removal. IMPRESSION: Radioactive seed localization the recently biopsied left axillary lymph node. No apparent complications. Electronically Signed   By: Claudie Revering M.D.   On: 10/25/2017 15:13   Mm Clip Placement Left  Result Date: 10/25/2017 CLINICAL DATA:  Status post ultrasound-guided radioactive seed placement within an abnormal appearing left axillary lymph node with discordant benign biopsy results. Recently diagnosed left breast cancer. EXAM: DIAGNOSTIC LEFT MAMMOGRAM POST ULTRASOUND-GUIDED RADIOACTIVE SEED PLACEMENT COMPARISON:  Previous exam(s). FINDINGS: Mammographic images were obtained following ultrasound-guided radioactive seed placement. These demonstrate the radioactive seed at the anterior aspect of the left axillary lymph node containing a spiral shaped HydroMARK biopsy marker clip. IMPRESSION: Appropriate location of the radioactive seed. Final Assessment: Post Procedure Mammograms for Seed Placement Electronically Signed   By: Claudie Revering M.D.   On: 10/25/2017 13:45       IMPRESSION/PLAN: 1. Stage IIA, cT3N0M0 grade 1, ER/PR positive, invasive ductal carcinoma. Dr. Lisbeth Renshaw discusses the pathology findings and reviews the nature of invasive left breast disease. The patient has done well surgically, but will be seen by Dr. Iran Planas on Thursday. We anticipate she will have further expansion over the next few weeks. Once this has completed, she will be ready to proceed with external radiotherapy to the breast and continuation of antiestrogen therapy. We discussed the risks, benefits, short, and long term effects of radiotherapy, and the patient is interested in  proceeding. Dr. Lisbeth Renshaw discusses the delivery and logistics of radiotherapy and anticipates a course of 6 1/2 weeks of radiotherapy to the chest wall and regional nodes with deep inspiration breath hold technique. We will contact her to coordinate the simulation process.  In a visit lasting 60 minutes, greater than 50% of the time was spent face to face discussing her case, and coordinating the patient's care.   The above documentation reflects my direct findings during this shared patient visit. Please see the separate note by Dr. Lisbeth Renshaw on this date for the remainder of the patient's plan of care.    Carola Rhine, PAC

## 2017-11-30 ENCOUNTER — Ambulatory Visit
Admission: RE | Admit: 2017-11-30 | Discharge: 2017-11-30 | Disposition: A | Discharge status: Home / Self Care | Payer: Commercial Managed Care - PPO | Source: Ambulatory Visit | Attending: Radiation Oncology | Admitting: Radiation Oncology

## 2017-11-30 DIAGNOSIS — C50512 Malignant neoplasm of lower-outer quadrant of left female breast: Secondary | ICD-10-CM | POA: Diagnosis not present

## 2017-11-30 DIAGNOSIS — Z170 Estrogen receptor positive status [ER+]: Secondary | ICD-10-CM | POA: Insufficient documentation

## 2017-11-30 DIAGNOSIS — Z510 Encounter for antineoplastic radiation therapy: Secondary | ICD-10-CM | POA: Insufficient documentation

## 2017-12-02 ENCOUNTER — Telehealth: Payer: Self-pay | Admitting: Hematology and Oncology

## 2017-12-02 NOTE — Telephone Encounter (Signed)
Per 9/18 sch msg.  Patient scheduled on 9/12 due to availabiltiy.  Called patient and left message.  Mailed calendar.

## 2017-12-07 ENCOUNTER — Ambulatory Visit
Admission: RE | Admit: 2017-12-07 | Discharge: 2017-12-07 | Disposition: A | Discharge status: Home / Self Care | Payer: Commercial Managed Care - PPO | Source: Ambulatory Visit | Attending: Radiation Oncology | Admitting: Radiation Oncology

## 2017-12-07 DIAGNOSIS — Z170 Estrogen receptor positive status [ER+]: Secondary | ICD-10-CM | POA: Diagnosis not present

## 2017-12-07 DIAGNOSIS — C50512 Malignant neoplasm of lower-outer quadrant of left female breast: Secondary | ICD-10-CM | POA: Diagnosis not present

## 2017-12-07 DIAGNOSIS — Z510 Encounter for antineoplastic radiation therapy: Secondary | ICD-10-CM | POA: Diagnosis not present

## 2017-12-08 ENCOUNTER — Ambulatory Visit
Admission: RE | Admit: 2017-12-08 | Discharge: 2017-12-08 | Disposition: A | Discharge status: Home / Self Care | Payer: Commercial Managed Care - PPO | Source: Ambulatory Visit | Attending: Radiation Oncology | Admitting: Radiation Oncology

## 2017-12-08 DIAGNOSIS — Z510 Encounter for antineoplastic radiation therapy: Secondary | ICD-10-CM | POA: Diagnosis not present

## 2017-12-08 DIAGNOSIS — Z170 Estrogen receptor positive status [ER+]: Secondary | ICD-10-CM | POA: Diagnosis not present

## 2017-12-08 DIAGNOSIS — C50512 Malignant neoplasm of lower-outer quadrant of left female breast: Secondary | ICD-10-CM | POA: Diagnosis not present

## 2017-12-09 ENCOUNTER — Ambulatory Visit
Admission: RE | Admit: 2017-12-09 | Discharge: 2017-12-09 | Disposition: A | Discharge status: Home / Self Care | Payer: Commercial Managed Care - PPO | Source: Ambulatory Visit | Attending: Radiation Oncology | Admitting: Radiation Oncology

## 2017-12-09 DIAGNOSIS — Z170 Estrogen receptor positive status [ER+]: Secondary | ICD-10-CM | POA: Diagnosis not present

## 2017-12-09 DIAGNOSIS — Z510 Encounter for antineoplastic radiation therapy: Secondary | ICD-10-CM | POA: Diagnosis not present

## 2017-12-09 DIAGNOSIS — C50512 Malignant neoplasm of lower-outer quadrant of left female breast: Secondary | ICD-10-CM | POA: Diagnosis not present

## 2017-12-10 ENCOUNTER — Ambulatory Visit
Admission: RE | Admit: 2017-12-10 | Discharge: 2017-12-10 | Disposition: A | Discharge status: Home / Self Care | Payer: Commercial Managed Care - PPO | Source: Ambulatory Visit | Attending: Radiation Oncology | Admitting: Radiation Oncology

## 2017-12-10 DIAGNOSIS — C50512 Malignant neoplasm of lower-outer quadrant of left female breast: Secondary | ICD-10-CM

## 2017-12-10 DIAGNOSIS — Z170 Estrogen receptor positive status [ER+]: Secondary | ICD-10-CM | POA: Diagnosis not present

## 2017-12-10 DIAGNOSIS — Z510 Encounter for antineoplastic radiation therapy: Secondary | ICD-10-CM | POA: Diagnosis not present

## 2017-12-10 MED ORDER — ALRA NON-METALLIC DEODORANT (RAD-ONC)
1.0000 "application " | Freq: Once | TOPICAL | Status: AC
  Administered 2017-12-10: 1 via TOPICAL

## 2017-12-10 MED ORDER — RADIAPLEXRX EX GEL
Freq: Once | CUTANEOUS | Status: AC
  Administered 2017-12-10: 17:00:00 via TOPICAL

## 2017-12-10 NOTE — Progress Notes (Signed)
Pt here for patient teaching.  Pt given Radiation and You booklet, skin care instructions, Alra deodorant and Radiaplex gel.  Reviewed areas of pertinence such as fatigue, hair loss, skin changes, breast tenderness and breast swelling . Pt able to give teach back of to pat skin and use unscented/gentle soap,apply Radiaplex bid, avoid applying anything to skin within 4 hours of treatment, avoid wearing an under wire bra and to use an electric razor if they must shave. Pt demonstrated understanding and verbalizes understanding of information given and will contact nursing with any questions or concerns.  Avilyn Virtue M. Dalores Weger RN, BSN       

## 2017-12-13 ENCOUNTER — Ambulatory Visit: Payer: Commercial Managed Care - PPO

## 2017-12-14 ENCOUNTER — Ambulatory Visit
Admission: RE | Admit: 2017-12-14 | Discharge: 2017-12-14 | Disposition: A | Discharge status: Home / Self Care | Payer: Commercial Managed Care - PPO | Source: Ambulatory Visit | Attending: Radiation Oncology | Admitting: Radiation Oncology

## 2017-12-14 DIAGNOSIS — Z170 Estrogen receptor positive status [ER+]: Secondary | ICD-10-CM | POA: Diagnosis not present

## 2017-12-14 DIAGNOSIS — Z510 Encounter for antineoplastic radiation therapy: Secondary | ICD-10-CM | POA: Insufficient documentation

## 2017-12-14 DIAGNOSIS — C50512 Malignant neoplasm of lower-outer quadrant of left female breast: Secondary | ICD-10-CM | POA: Insufficient documentation

## 2017-12-15 ENCOUNTER — Ambulatory Visit
Admission: RE | Admit: 2017-12-15 | Discharge: 2017-12-15 | Disposition: A | Discharge status: Home / Self Care | Payer: Commercial Managed Care - PPO | Source: Ambulatory Visit | Attending: Radiation Oncology | Admitting: Radiation Oncology

## 2017-12-15 DIAGNOSIS — C50512 Malignant neoplasm of lower-outer quadrant of left female breast: Secondary | ICD-10-CM | POA: Diagnosis not present

## 2017-12-15 DIAGNOSIS — Z170 Estrogen receptor positive status [ER+]: Secondary | ICD-10-CM | POA: Diagnosis not present

## 2017-12-15 DIAGNOSIS — Z510 Encounter for antineoplastic radiation therapy: Secondary | ICD-10-CM | POA: Diagnosis not present

## 2017-12-16 ENCOUNTER — Ambulatory Visit
Admission: RE | Admit: 2017-12-16 | Discharge: 2017-12-16 | Disposition: A | Discharge status: Home / Self Care | Payer: Commercial Managed Care - PPO | Source: Ambulatory Visit | Attending: Radiation Oncology | Admitting: Radiation Oncology

## 2017-12-16 DIAGNOSIS — Z510 Encounter for antineoplastic radiation therapy: Secondary | ICD-10-CM | POA: Diagnosis not present

## 2017-12-16 DIAGNOSIS — C50512 Malignant neoplasm of lower-outer quadrant of left female breast: Secondary | ICD-10-CM | POA: Diagnosis not present

## 2017-12-16 DIAGNOSIS — Z170 Estrogen receptor positive status [ER+]: Secondary | ICD-10-CM | POA: Diagnosis not present

## 2017-12-17 ENCOUNTER — Ambulatory Visit
Admission: RE | Admit: 2017-12-17 | Discharge: 2017-12-17 | Disposition: A | Discharge status: Home / Self Care | Payer: Commercial Managed Care - PPO | Source: Ambulatory Visit | Attending: Radiation Oncology | Admitting: Radiation Oncology

## 2017-12-17 DIAGNOSIS — Z170 Estrogen receptor positive status [ER+]: Secondary | ICD-10-CM | POA: Diagnosis not present

## 2017-12-17 DIAGNOSIS — Z510 Encounter for antineoplastic radiation therapy: Secondary | ICD-10-CM | POA: Diagnosis not present

## 2017-12-17 DIAGNOSIS — C50512 Malignant neoplasm of lower-outer quadrant of left female breast: Secondary | ICD-10-CM | POA: Diagnosis not present

## 2017-12-20 ENCOUNTER — Ambulatory Visit
Admission: RE | Admit: 2017-12-20 | Discharge: 2017-12-20 | Disposition: A | Discharge status: Home / Self Care | Payer: Commercial Managed Care - PPO | Source: Ambulatory Visit | Attending: Radiation Oncology | Admitting: Radiation Oncology

## 2017-12-20 DIAGNOSIS — Z510 Encounter for antineoplastic radiation therapy: Secondary | ICD-10-CM | POA: Diagnosis not present

## 2017-12-20 DIAGNOSIS — Z170 Estrogen receptor positive status [ER+]: Secondary | ICD-10-CM | POA: Diagnosis not present

## 2017-12-20 DIAGNOSIS — C50512 Malignant neoplasm of lower-outer quadrant of left female breast: Secondary | ICD-10-CM | POA: Diagnosis not present

## 2017-12-21 ENCOUNTER — Ambulatory Visit
Admission: RE | Admit: 2017-12-21 | Discharge: 2017-12-21 | Disposition: A | Discharge status: Home / Self Care | Payer: Commercial Managed Care - PPO | Source: Ambulatory Visit | Attending: Radiation Oncology | Admitting: Radiation Oncology

## 2017-12-21 DIAGNOSIS — Z170 Estrogen receptor positive status [ER+]: Secondary | ICD-10-CM | POA: Diagnosis not present

## 2017-12-21 DIAGNOSIS — Z510 Encounter for antineoplastic radiation therapy: Secondary | ICD-10-CM | POA: Diagnosis not present

## 2017-12-21 DIAGNOSIS — C50512 Malignant neoplasm of lower-outer quadrant of left female breast: Secondary | ICD-10-CM | POA: Diagnosis not present

## 2017-12-22 ENCOUNTER — Ambulatory Visit
Admission: RE | Admit: 2017-12-22 | Discharge: 2017-12-22 | Disposition: A | Discharge status: Home / Self Care | Payer: Commercial Managed Care - PPO | Source: Ambulatory Visit | Attending: Radiation Oncology | Admitting: Radiation Oncology

## 2017-12-22 DIAGNOSIS — Z170 Estrogen receptor positive status [ER+]: Secondary | ICD-10-CM | POA: Diagnosis not present

## 2017-12-22 DIAGNOSIS — C50512 Malignant neoplasm of lower-outer quadrant of left female breast: Secondary | ICD-10-CM | POA: Diagnosis not present

## 2017-12-22 DIAGNOSIS — Z510 Encounter for antineoplastic radiation therapy: Secondary | ICD-10-CM | POA: Diagnosis not present

## 2017-12-23 ENCOUNTER — Ambulatory Visit: Payer: Commercial Managed Care - PPO

## 2017-12-24 ENCOUNTER — Ambulatory Visit
Admission: RE | Admit: 2017-12-24 | Discharge: 2017-12-24 | Disposition: A | Discharge status: Home / Self Care | Payer: Commercial Managed Care - PPO | Source: Ambulatory Visit | Attending: Radiation Oncology | Admitting: Radiation Oncology

## 2017-12-24 DIAGNOSIS — M9903 Segmental and somatic dysfunction of lumbar region: Secondary | ICD-10-CM | POA: Diagnosis not present

## 2017-12-24 DIAGNOSIS — M9905 Segmental and somatic dysfunction of pelvic region: Secondary | ICD-10-CM | POA: Diagnosis not present

## 2017-12-24 DIAGNOSIS — Z170 Estrogen receptor positive status [ER+]: Secondary | ICD-10-CM | POA: Diagnosis not present

## 2017-12-24 DIAGNOSIS — Z510 Encounter for antineoplastic radiation therapy: Secondary | ICD-10-CM | POA: Diagnosis not present

## 2017-12-24 DIAGNOSIS — M5416 Radiculopathy, lumbar region: Secondary | ICD-10-CM | POA: Diagnosis not present

## 2017-12-24 DIAGNOSIS — C50512 Malignant neoplasm of lower-outer quadrant of left female breast: Secondary | ICD-10-CM | POA: Diagnosis not present

## 2017-12-27 ENCOUNTER — Ambulatory Visit
Admission: RE | Admit: 2017-12-27 | Discharge: 2017-12-27 | Disposition: A | Discharge status: Home / Self Care | Payer: Commercial Managed Care - PPO | Source: Ambulatory Visit | Attending: Radiation Oncology | Admitting: Radiation Oncology

## 2017-12-27 DIAGNOSIS — Z510 Encounter for antineoplastic radiation therapy: Secondary | ICD-10-CM | POA: Diagnosis not present

## 2017-12-27 DIAGNOSIS — Z170 Estrogen receptor positive status [ER+]: Secondary | ICD-10-CM | POA: Diagnosis not present

## 2017-12-27 DIAGNOSIS — C50512 Malignant neoplasm of lower-outer quadrant of left female breast: Secondary | ICD-10-CM | POA: Diagnosis not present

## 2017-12-28 ENCOUNTER — Ambulatory Visit
Admission: RE | Admit: 2017-12-28 | Discharge: 2017-12-28 | Disposition: A | Discharge status: Home / Self Care | Payer: Commercial Managed Care - PPO | Source: Ambulatory Visit | Attending: Radiation Oncology | Admitting: Radiation Oncology

## 2017-12-28 DIAGNOSIS — Z510 Encounter for antineoplastic radiation therapy: Secondary | ICD-10-CM | POA: Diagnosis not present

## 2017-12-28 DIAGNOSIS — Z170 Estrogen receptor positive status [ER+]: Secondary | ICD-10-CM | POA: Diagnosis not present

## 2017-12-28 DIAGNOSIS — C50512 Malignant neoplasm of lower-outer quadrant of left female breast: Secondary | ICD-10-CM | POA: Diagnosis not present

## 2017-12-29 ENCOUNTER — Ambulatory Visit
Admission: RE | Admit: 2017-12-29 | Discharge: 2017-12-29 | Disposition: A | Discharge status: Home / Self Care | Payer: Commercial Managed Care - PPO | Source: Ambulatory Visit | Attending: Radiation Oncology | Admitting: Radiation Oncology

## 2017-12-29 DIAGNOSIS — C50512 Malignant neoplasm of lower-outer quadrant of left female breast: Secondary | ICD-10-CM | POA: Diagnosis not present

## 2017-12-29 DIAGNOSIS — Z510 Encounter for antineoplastic radiation therapy: Secondary | ICD-10-CM | POA: Diagnosis not present

## 2017-12-29 DIAGNOSIS — Z170 Estrogen receptor positive status [ER+]: Secondary | ICD-10-CM | POA: Diagnosis not present

## 2017-12-30 ENCOUNTER — Ambulatory Visit
Admission: RE | Admit: 2017-12-30 | Discharge: 2017-12-30 | Disposition: A | Discharge status: Home / Self Care | Payer: Commercial Managed Care - PPO | Source: Ambulatory Visit | Attending: Radiation Oncology | Admitting: Radiation Oncology

## 2017-12-30 DIAGNOSIS — Z510 Encounter for antineoplastic radiation therapy: Secondary | ICD-10-CM | POA: Diagnosis not present

## 2017-12-30 DIAGNOSIS — Z170 Estrogen receptor positive status [ER+]: Secondary | ICD-10-CM | POA: Diagnosis not present

## 2017-12-30 DIAGNOSIS — C50512 Malignant neoplasm of lower-outer quadrant of left female breast: Secondary | ICD-10-CM | POA: Diagnosis not present

## 2017-12-31 ENCOUNTER — Ambulatory Visit
Admission: RE | Admit: 2017-12-31 | Discharge: 2017-12-31 | Disposition: A | Discharge status: Home / Self Care | Payer: Commercial Managed Care - PPO | Source: Ambulatory Visit | Attending: Radiation Oncology | Admitting: Radiation Oncology

## 2017-12-31 DIAGNOSIS — C50512 Malignant neoplasm of lower-outer quadrant of left female breast: Secondary | ICD-10-CM | POA: Diagnosis not present

## 2017-12-31 DIAGNOSIS — Z510 Encounter for antineoplastic radiation therapy: Secondary | ICD-10-CM | POA: Diagnosis not present

## 2017-12-31 DIAGNOSIS — Z170 Estrogen receptor positive status [ER+]: Secondary | ICD-10-CM | POA: Diagnosis not present

## 2018-01-03 ENCOUNTER — Ambulatory Visit
Admission: RE | Admit: 2018-01-03 | Discharge: 2018-01-03 | Disposition: A | Discharge status: Home / Self Care | Payer: Commercial Managed Care - PPO | Source: Ambulatory Visit | Attending: Radiation Oncology | Admitting: Radiation Oncology

## 2018-01-03 DIAGNOSIS — Z510 Encounter for antineoplastic radiation therapy: Secondary | ICD-10-CM | POA: Diagnosis not present

## 2018-01-03 DIAGNOSIS — Z170 Estrogen receptor positive status [ER+]: Secondary | ICD-10-CM | POA: Diagnosis not present

## 2018-01-03 DIAGNOSIS — C50512 Malignant neoplasm of lower-outer quadrant of left female breast: Secondary | ICD-10-CM | POA: Diagnosis not present

## 2018-01-04 ENCOUNTER — Ambulatory Visit
Admission: RE | Admit: 2018-01-04 | Discharge: 2018-01-04 | Disposition: A | Discharge status: Home / Self Care | Payer: Commercial Managed Care - PPO | Source: Ambulatory Visit | Attending: Radiation Oncology | Admitting: Radiation Oncology

## 2018-01-04 DIAGNOSIS — Z170 Estrogen receptor positive status [ER+]: Secondary | ICD-10-CM | POA: Diagnosis not present

## 2018-01-04 DIAGNOSIS — Z510 Encounter for antineoplastic radiation therapy: Secondary | ICD-10-CM | POA: Diagnosis not present

## 2018-01-04 DIAGNOSIS — C50512 Malignant neoplasm of lower-outer quadrant of left female breast: Secondary | ICD-10-CM | POA: Diagnosis not present

## 2018-01-05 ENCOUNTER — Ambulatory Visit
Admission: RE | Admit: 2018-01-05 | Discharge: 2018-01-05 | Disposition: A | Discharge status: Home / Self Care | Payer: Commercial Managed Care - PPO | Source: Ambulatory Visit | Attending: Radiation Oncology | Admitting: Radiation Oncology

## 2018-01-05 DIAGNOSIS — Z170 Estrogen receptor positive status [ER+]: Secondary | ICD-10-CM | POA: Diagnosis not present

## 2018-01-05 DIAGNOSIS — C50512 Malignant neoplasm of lower-outer quadrant of left female breast: Secondary | ICD-10-CM | POA: Diagnosis not present

## 2018-01-05 DIAGNOSIS — Z510 Encounter for antineoplastic radiation therapy: Secondary | ICD-10-CM | POA: Diagnosis not present

## 2018-01-06 ENCOUNTER — Ambulatory Visit
Admission: RE | Admit: 2018-01-06 | Discharge: 2018-01-06 | Disposition: A | Discharge status: Home / Self Care | Payer: Commercial Managed Care - PPO | Source: Ambulatory Visit | Attending: Radiation Oncology | Admitting: Radiation Oncology

## 2018-01-06 DIAGNOSIS — C50412 Malignant neoplasm of upper-outer quadrant of left female breast: Secondary | ICD-10-CM | POA: Diagnosis not present

## 2018-01-06 DIAGNOSIS — Z510 Encounter for antineoplastic radiation therapy: Secondary | ICD-10-CM | POA: Diagnosis not present

## 2018-01-06 DIAGNOSIS — C50512 Malignant neoplasm of lower-outer quadrant of left female breast: Secondary | ICD-10-CM | POA: Diagnosis not present

## 2018-01-06 DIAGNOSIS — Z170 Estrogen receptor positive status [ER+]: Secondary | ICD-10-CM | POA: Diagnosis not present

## 2018-01-07 ENCOUNTER — Ambulatory Visit
Admission: RE | Admit: 2018-01-07 | Discharge: 2018-01-07 | Disposition: A | Discharge status: Home / Self Care | Payer: Commercial Managed Care - PPO | Source: Ambulatory Visit | Attending: Radiation Oncology | Admitting: Radiation Oncology

## 2018-01-07 DIAGNOSIS — Z170 Estrogen receptor positive status [ER+]: Secondary | ICD-10-CM | POA: Diagnosis not present

## 2018-01-07 DIAGNOSIS — C50512 Malignant neoplasm of lower-outer quadrant of left female breast: Secondary | ICD-10-CM | POA: Diagnosis not present

## 2018-01-07 DIAGNOSIS — Z510 Encounter for antineoplastic radiation therapy: Secondary | ICD-10-CM | POA: Diagnosis not present

## 2018-01-10 ENCOUNTER — Ambulatory Visit
Admission: RE | Admit: 2018-01-10 | Discharge: 2018-01-10 | Disposition: A | Discharge status: Home / Self Care | Payer: Commercial Managed Care - PPO | Source: Ambulatory Visit | Attending: Radiation Oncology | Admitting: Radiation Oncology

## 2018-01-10 DIAGNOSIS — C50512 Malignant neoplasm of lower-outer quadrant of left female breast: Secondary | ICD-10-CM | POA: Diagnosis not present

## 2018-01-10 DIAGNOSIS — Z170 Estrogen receptor positive status [ER+]: Secondary | ICD-10-CM | POA: Diagnosis not present

## 2018-01-10 DIAGNOSIS — Z510 Encounter for antineoplastic radiation therapy: Secondary | ICD-10-CM | POA: Diagnosis not present

## 2018-01-11 ENCOUNTER — Ambulatory Visit
Admission: RE | Admit: 2018-01-11 | Discharge: 2018-01-11 | Disposition: A | Discharge status: Home / Self Care | Payer: Commercial Managed Care - PPO | Source: Ambulatory Visit | Attending: Radiation Oncology | Admitting: Radiation Oncology

## 2018-01-11 DIAGNOSIS — C50512 Malignant neoplasm of lower-outer quadrant of left female breast: Secondary | ICD-10-CM | POA: Diagnosis not present

## 2018-01-11 DIAGNOSIS — Z510 Encounter for antineoplastic radiation therapy: Secondary | ICD-10-CM | POA: Diagnosis not present

## 2018-01-11 DIAGNOSIS — Z170 Estrogen receptor positive status [ER+]: Secondary | ICD-10-CM | POA: Diagnosis not present

## 2018-01-12 ENCOUNTER — Ambulatory Visit
Admission: RE | Admit: 2018-01-12 | Discharge: 2018-01-12 | Disposition: A | Discharge status: Home / Self Care | Payer: Commercial Managed Care - PPO | Source: Ambulatory Visit | Attending: Radiation Oncology | Admitting: Radiation Oncology

## 2018-01-12 DIAGNOSIS — C50512 Malignant neoplasm of lower-outer quadrant of left female breast: Secondary | ICD-10-CM | POA: Diagnosis not present

## 2018-01-12 DIAGNOSIS — Z510 Encounter for antineoplastic radiation therapy: Secondary | ICD-10-CM | POA: Diagnosis not present

## 2018-01-12 DIAGNOSIS — Z170 Estrogen receptor positive status [ER+]: Secondary | ICD-10-CM | POA: Diagnosis not present

## 2018-01-13 ENCOUNTER — Ambulatory Visit: Payer: Commercial Managed Care - PPO

## 2018-01-14 ENCOUNTER — Ambulatory Visit
Admission: RE | Admit: 2018-01-14 | Discharge: 2018-01-14 | Disposition: A | Discharge status: Home / Self Care | Payer: Commercial Managed Care - PPO | Source: Ambulatory Visit | Attending: Radiation Oncology | Admitting: Radiation Oncology

## 2018-01-14 ENCOUNTER — Ambulatory Visit: Payer: Commercial Managed Care - PPO | Admitting: Radiation Oncology

## 2018-01-14 DIAGNOSIS — C50512 Malignant neoplasm of lower-outer quadrant of left female breast: Secondary | ICD-10-CM | POA: Diagnosis present

## 2018-01-14 DIAGNOSIS — Z510 Encounter for antineoplastic radiation therapy: Secondary | ICD-10-CM | POA: Insufficient documentation

## 2018-01-14 DIAGNOSIS — Z170 Estrogen receptor positive status [ER+]: Secondary | ICD-10-CM | POA: Insufficient documentation

## 2018-01-17 ENCOUNTER — Ambulatory Visit: Payer: Commercial Managed Care - PPO

## 2018-01-17 ENCOUNTER — Ambulatory Visit
Admission: RE | Admit: 2018-01-17 | Discharge: 2018-01-17 | Disposition: A | Discharge status: Home / Self Care | Payer: Commercial Managed Care - PPO | Source: Ambulatory Visit | Attending: Radiation Oncology | Admitting: Radiation Oncology

## 2018-01-17 ENCOUNTER — Inpatient Hospital Stay: Payer: Commercial Managed Care - PPO | Attending: Hematology and Oncology | Admitting: Hematology and Oncology

## 2018-01-17 ENCOUNTER — Telehealth: Payer: Self-pay | Admitting: Hematology and Oncology

## 2018-01-17 DIAGNOSIS — Z170 Estrogen receptor positive status [ER+]: Secondary | ICD-10-CM | POA: Insufficient documentation

## 2018-01-17 DIAGNOSIS — Z923 Personal history of irradiation: Secondary | ICD-10-CM | POA: Insufficient documentation

## 2018-01-17 DIAGNOSIS — Z79811 Long term (current) use of aromatase inhibitors: Secondary | ICD-10-CM | POA: Insufficient documentation

## 2018-01-17 DIAGNOSIS — Z9012 Acquired absence of left breast and nipple: Secondary | ICD-10-CM | POA: Diagnosis not present

## 2018-01-17 DIAGNOSIS — Z510 Encounter for antineoplastic radiation therapy: Secondary | ICD-10-CM | POA: Diagnosis not present

## 2018-01-17 DIAGNOSIS — C50512 Malignant neoplasm of lower-outer quadrant of left female breast: Secondary | ICD-10-CM | POA: Diagnosis not present

## 2018-01-17 MED ORDER — ANASTROZOLE 1 MG PO TABS: 1.0000 mg | ORAL_TABLET | Freq: Every day | ORAL | 3 refills | Status: DC

## 2018-01-17 NOTE — Telephone Encounter (Signed)
Printed calendar and avs. °

## 2018-01-17 NOTE — Assessment & Plan Note (Signed)
06/19/2016 Left breast biopsy 3:30: IDC with DCIS grade 1, ER 90%, PR 50%, Ki-67 15%, HER-2 negative ratio 1.13; biopsy 5:30 position: IDC grade 1  10/26/17: Left mastectomy: IDC grade 1, 2 foci largest spans 8.5 cm, intermediate grade DCIS, lymphovascular invasion identified, perineural invasion identified, 1/2 lymph nodes positive with extracapsular extension, ER 9200%, PR 5 to 50%, HER-2 negative, Ki-67 10 to 15%, T3N1A  Oncotype DXscore 22, intermediate risk, chemotherapy not felt to have significant benefit.  Treatment plan: 1. Antiestrogen therapy with anastrozole 1 mg daily started 07/15/2016 2. Mastectomy 10/26/2017, Mammaprint low risk luminal type A 3. Followed by adjuvant radiation 12/08/17- 01/17/18  4. Followed by adjuvant antiestrogen therapy -------------------------------------------------------------------- Current treatment: Resumption of anastrozole 1 mg daily Patient had previously tolerated anastrozole extremely well. Return to clinic in 3 months for survivorship care plan visit

## 2018-01-17 NOTE — Progress Notes (Signed)
Patient Care Team: Kelton Pillar, MD as PCP - General (Family Medicine)  DIAGNOSIS:  Encounter Diagnosis  Name Primary?  . Malignant neoplasm of lower-outer quadrant of left breast of female, estrogen receptor positive ([place])     SUMMARY OF ONCOLOGIC HISTORY:   Malignant neoplasm of lower-outer quadrant of left breast of female, estrogen receptor positive ([place])   06/11/2016 Mammogram    Palpable left breast masses 3:00 position: 2.2 cm; 5:30 position: 2.5 cm; 6:30 position: 0.7 cm    06/19/2016 Initial Diagnosis    Left breast biopsy 3:30: IDC with DCIS grade 1, ER 90%, PR 50%, Ki-67 15%, HER-2 negative ratio 1.13; biopsy 5:30 position: IDC grade 1    07/13/2016 Breast MRI    Large area of abnormal enhancement lower inner and lower outer quadrants left breast spanning 9 cm x 6.4 cm x 5.3 cm, no abnormal enlarged lymph nodes; T3 N0 stage II a (New AJCC staging)     07/15/2016 - 12/08/2017 Anti-estrogen oral therapy    Neoadjuvant anastrozole 1 mg daily    07/17/2016 Oncotype testing    Testing done on the biopsy: Oncotype DX score 22, intermediate risk    02/02/2017 Breast MRI    Left breast multicentric disease unchanged measuring 2.7 x 1.6 cm.  Mass in the non-mass enhancement are also not significantly changed measuring 6.2 x 2.4 cm. new enhancing mass within the outer right breast 7 mm which could be fat necrosis or inclusion cyst     02/09/2017 Imaging    Ultrasound of the right breast lesion noted on MRI: No sonographic finding corresponds to the abnormality noted on MRI    10/26/2017 Surgery    Left mastectomy: IDC grade 1, 2 foci largest spans 8.5 cm, intermediate grade DCIS, lymphovascular invasion identified, perineural invasion identified, 1/2 lymph nodes positive with extracapsular extension, ER 9200%, PR 5 to 50%, HER-2 negative, Ki-67 10 to 15%, T3N1A    11/02/2017 Cancer Staging    Staging form: Breast, AJCC 8th Edition - Pathologic: No Stage Recommended (ypT3, pN1a,  cM0, G1, ER+, PR+, HER2-) - Signed by Nicholas Lose, MD on 11/02/2017    12/08/2017 - 01/17/2018 Radiation Therapy    Adjuvant radiation therapy    01/17/2018 -  Anti-estrogen oral therapy    Anastrozole 1 mg daily adjuvant therapy     CHIEF COMPLIANT: Follow-up towards end of radiation  INTERVAL HISTORY: Kathryn Lucas is a 56-year with above-mentioned history of left breast cancer treated with mastectomy and is currently on radiation therapy.  She has radiation dermatitis but tolerating it fairly well.  She will finish radiation on 01/26/2018.  She is working [employer] both of these fairly well.  Her dad and mom are nursing home apparently her dad is extremely better to her and she is sad about that.  REVIEW OF SYSTEMS:   Constitutional: Denies fevers, chills or abnormal weight loss Eyes: Denies blurriness of vision Ears, nose, mouth, throat, and face: Denies mucositis or sore throat Respiratory: Denies cough, dyspnea or wheezes Cardiovascular: Denies palpitation, chest discomfort Gastrointestinal:  Denies nausea, heartburn or change in bowel habits Skin: Denies abnormal skin rashes Lymphatics: Denies new lymphadenopathy or easy bruising Neurological:Denies numbness, tingling or new weaknesses Behavioral/Psych: Mood is stable, no new changes  Extremities: No lower extremity edema Breast: Left mastectomy, radiation dermatitis All other systems were reviewed with the patient and are negative.  I have reviewed the past medical history, past surgical history, social history and family history with the  patient and they are unchanged from previous note.  ALLERGIES:  has No Known Allergies.  MEDICATIONS:  Current Outpatient Medications  Medication Sig Dispense Refill  . anastrozole (ARIMIDEX) 1 MG tablet Take 1 tablet (1 mg total) by mouth daily. 90 tablet 3  . fluticasone (FLONASE) 50 MCG/ACT nasal spray Place into the nose.    . levothyroxine (SYNTHROID, LEVOTHROID) 175  MCG tablet Take 112 mcg by mouth.      No current facility-administered medications for this visit.     PHYSICAL EXAMINATION: ECOG PERFORMANCE STATUS: 1 - Symptomatic but completely ambulatory  Vitals:   01/17/18 0948  BP: 124/79  Pulse: 68  Resp: 18  Temp: 98.4 F (36.9 C)  SpO2: 100%   Filed Weights   01/17/18 0948  Weight: 191 lb 3.2 oz (86.7 kg)    GENERAL:alert, no distress and comfortable SKIN: skin color, texture, turgor are normal, no rashes or significant lesions EYES: normal, Conjunctiva are pink and non-injected, sclera clear OROPHARYNX:no exudate, no erythema and lips, buccal mucosa, and tongue normal  NECK: supple, thyroid normal size, non-tender, without nodularity LYMPH:  no palpable lymphadenopathy in the cervical, axillary or inguinal LUNGS: clear to auscultation and percussion with normal breathing effort HEART: regular rate & rhythm and no murmurs and no lower extremity edema ABDOMEN:abdomen soft, non-tender and normal bowel sounds MUSCULOSKELETAL:no cyanosis of digits and no clubbing  NEURO: alert & oriented x 3 with fluent speech, no focal motor/sensory deficits EXTREMITIES: No lower extremity edema  ASSESSMENT & PLAN:  Malignant neoplasm of lower-outer quadrant of left breast of female, estrogen receptor positive ([place]) 06/19/2016 Left breast biopsy 3:30: IDC with DCIS grade 1, ER 90%, PR 50%, Ki-67 15%, HER-2 negative ratio 1.13; biopsy 5:30 position: IDC grade 1  10/26/17: Left mastectomy: IDC grade 1, 2 foci largest spans 8.5 cm, intermediate grade DCIS, lymphovascular invasion identified, perineural invasion identified, 1/2 lymph nodes positive with extracapsular extension, ER 9200%, PR 5 to 50%, HER-2 negative, Ki-67 10 to 15%, T3N1A  Oncotype DXscore 22, intermediate risk, chemotherapy not felt to have significant benefit.  Treatment plan: 1. Antiestrogen therapy with anastrozole 1 mg daily started 07/15/2016 2. Mastectomy 10/26/2017,  Mammaprint low risk luminal type A 3. Followed by adjuvant radiation 12/08/17- 01/26/18  4. Followed by adjuvant antiestrogen therapy -------------------------------------------------------------------- Current treatment: Resumption of anastrozole 1 mg daily Patient had previously tolerated anastrozole extremely well. I sent a new prescription to her pharmacy. Return to clinic in 4 months for survivorship care plan visit   No orders of the defined types were placed in this encounter.  The patient has a good understanding of the overall plan. she agrees with it. she will call with any problems that may develop before the next visit here.   Harriette Ohara, MD 01/17/18

## 2018-01-18 ENCOUNTER — Ambulatory Visit
Admission: RE | Admit: 2018-01-18 | Discharge: 2018-01-18 | Disposition: A | Discharge status: Home / Self Care | Payer: Commercial Managed Care - PPO | Source: Ambulatory Visit | Attending: Radiation Oncology | Admitting: Radiation Oncology

## 2018-01-18 ENCOUNTER — Ambulatory Visit: Payer: Commercial Managed Care - PPO

## 2018-01-18 DIAGNOSIS — C50512 Malignant neoplasm of lower-outer quadrant of left female breast: Secondary | ICD-10-CM | POA: Diagnosis not present

## 2018-01-18 DIAGNOSIS — Z170 Estrogen receptor positive status [ER+]: Secondary | ICD-10-CM | POA: Diagnosis not present

## 2018-01-18 DIAGNOSIS — Z510 Encounter for antineoplastic radiation therapy: Secondary | ICD-10-CM | POA: Diagnosis not present

## 2018-01-19 ENCOUNTER — Ambulatory Visit
Admission: RE | Admit: 2018-01-19 | Discharge: 2018-01-19 | Disposition: A | Discharge status: Home / Self Care | Payer: Commercial Managed Care - PPO | Source: Ambulatory Visit | Attending: Radiation Oncology | Admitting: Radiation Oncology

## 2018-01-19 ENCOUNTER — Ambulatory Visit: Payer: Commercial Managed Care - PPO

## 2018-01-19 DIAGNOSIS — Z170 Estrogen receptor positive status [ER+]: Secondary | ICD-10-CM | POA: Diagnosis not present

## 2018-01-19 DIAGNOSIS — Z510 Encounter for antineoplastic radiation therapy: Secondary | ICD-10-CM | POA: Diagnosis not present

## 2018-01-19 DIAGNOSIS — C50512 Malignant neoplasm of lower-outer quadrant of left female breast: Secondary | ICD-10-CM | POA: Diagnosis not present

## 2018-01-20 ENCOUNTER — Ambulatory Visit: Payer: Commercial Managed Care - PPO

## 2018-01-20 ENCOUNTER — Ambulatory Visit
Admission: RE | Admit: 2018-01-20 | Discharge: 2018-01-20 | Disposition: A | Discharge status: Home / Self Care | Payer: Commercial Managed Care - PPO | Source: Ambulatory Visit | Attending: Radiation Oncology | Admitting: Radiation Oncology

## 2018-01-20 DIAGNOSIS — Z170 Estrogen receptor positive status [ER+]: Secondary | ICD-10-CM | POA: Diagnosis not present

## 2018-01-20 DIAGNOSIS — C50512 Malignant neoplasm of lower-outer quadrant of left female breast: Secondary | ICD-10-CM | POA: Diagnosis not present

## 2018-01-20 DIAGNOSIS — Z510 Encounter for antineoplastic radiation therapy: Secondary | ICD-10-CM | POA: Diagnosis not present

## 2018-01-21 ENCOUNTER — Ambulatory Visit: Payer: Commercial Managed Care - PPO

## 2018-01-21 ENCOUNTER — Ambulatory Visit
Admission: RE | Admit: 2018-01-21 | Discharge: 2018-01-21 | Disposition: A | Discharge status: Home / Self Care | Payer: Commercial Managed Care - PPO | Source: Ambulatory Visit | Attending: Radiation Oncology | Admitting: Radiation Oncology

## 2018-01-21 DIAGNOSIS — Z510 Encounter for antineoplastic radiation therapy: Secondary | ICD-10-CM | POA: Diagnosis not present

## 2018-01-21 DIAGNOSIS — C50512 Malignant neoplasm of lower-outer quadrant of left female breast: Secondary | ICD-10-CM | POA: Diagnosis not present

## 2018-01-21 DIAGNOSIS — Z170 Estrogen receptor positive status [ER+]: Secondary | ICD-10-CM | POA: Diagnosis not present

## 2018-01-24 ENCOUNTER — Ambulatory Visit: Payer: Commercial Managed Care - PPO

## 2018-01-24 ENCOUNTER — Ambulatory Visit
Admission: RE | Admit: 2018-01-24 | Discharge: 2018-01-24 | Disposition: A | Discharge status: Home / Self Care | Payer: Commercial Managed Care - PPO | Source: Ambulatory Visit | Attending: Radiation Oncology | Admitting: Radiation Oncology

## 2018-01-24 DIAGNOSIS — Z170 Estrogen receptor positive status [ER+]: Secondary | ICD-10-CM | POA: Diagnosis not present

## 2018-01-24 DIAGNOSIS — Z510 Encounter for antineoplastic radiation therapy: Secondary | ICD-10-CM | POA: Diagnosis not present

## 2018-01-24 DIAGNOSIS — C50512 Malignant neoplasm of lower-outer quadrant of left female breast: Secondary | ICD-10-CM | POA: Diagnosis not present

## 2018-01-25 ENCOUNTER — Ambulatory Visit: Payer: Commercial Managed Care - PPO

## 2018-01-25 ENCOUNTER — Ambulatory Visit
Admission: RE | Admit: 2018-01-25 | Discharge: 2018-01-25 | Disposition: A | Discharge status: Home / Self Care | Payer: Commercial Managed Care - PPO | Source: Ambulatory Visit | Attending: Radiation Oncology | Admitting: Radiation Oncology

## 2018-01-25 ENCOUNTER — Inpatient Hospital Stay: Payer: Commercial Managed Care - PPO | Admitting: Hematology and Oncology

## 2018-01-25 DIAGNOSIS — Z510 Encounter for antineoplastic radiation therapy: Secondary | ICD-10-CM | POA: Diagnosis not present

## 2018-01-25 DIAGNOSIS — Z170 Estrogen receptor positive status [ER+]: Secondary | ICD-10-CM | POA: Diagnosis not present

## 2018-01-25 DIAGNOSIS — C50512 Malignant neoplasm of lower-outer quadrant of left female breast: Secondary | ICD-10-CM | POA: Diagnosis not present

## 2018-01-25 NOTE — Assessment & Plan Note (Deleted)
06/19/2016 Left breast biopsy 3:30: IDC with DCIS grade 1, ER 90%, PR 50%, Ki-67 15%, HER-2 negative ratio 1.13; biopsy 5:30 position: IDC grade 1  10/26/17:Left mastectomy: IDC grade 1, 2 foci largest spans 8.5 cm, intermediate grade DCIS, lymphovascular invasion identified, perineural invasion identified, 1/2 lymph nodes positive with extracapsular extension, ER 9200%, PR 5 to 50%, HER-2 negative, Ki-67 10 to 15%, T3N1A  Oncotype DXscore 22, intermediate risk, chemotherapy not felt to have significant benefit.  Treatment plan: 1. Antiestrogen therapy with anastrozole 1 mg daily started 07/15/2016 2. Mastectomy 10/26/2017, Mammaprint low risk luminal type A 3. Followed by adjuvant radiation 12/08/17- 01/26/18  4. Followed by adjuvant antiestrogen therapy -------------------------------------------------------------------- Current treatment: Resumption of anastrozole 1 mg daily Patient had previously tolerated anastrozole extremely well. 

## 2018-01-26 ENCOUNTER — Encounter: Payer: Self-pay | Admitting: Radiation Oncology

## 2018-01-26 ENCOUNTER — Ambulatory Visit
Admission: RE | Admit: 2018-01-26 | Discharge: 2018-01-26 | Disposition: A | Discharge status: Home / Self Care | Payer: Commercial Managed Care - PPO | Source: Ambulatory Visit | Attending: Radiation Oncology | Admitting: Radiation Oncology

## 2018-01-26 DIAGNOSIS — Z510 Encounter for antineoplastic radiation therapy: Secondary | ICD-10-CM | POA: Diagnosis not present

## 2018-01-26 DIAGNOSIS — C50512 Malignant neoplasm of lower-outer quadrant of left female breast: Secondary | ICD-10-CM | POA: Diagnosis not present

## 2018-01-26 DIAGNOSIS — Z170 Estrogen receptor positive status [ER+]: Secondary | ICD-10-CM | POA: Diagnosis not present

## 2018-02-08 DIAGNOSIS — M9903 Segmental and somatic dysfunction of lumbar region: Secondary | ICD-10-CM | POA: Diagnosis not present

## 2018-02-08 DIAGNOSIS — M9905 Segmental and somatic dysfunction of pelvic region: Secondary | ICD-10-CM | POA: Diagnosis not present

## 2018-02-08 DIAGNOSIS — M5416 Radiculopathy, lumbar region: Secondary | ICD-10-CM | POA: Diagnosis not present

## 2018-02-17 NOTE — Progress Notes (Signed)
  Radiation Oncology         [phone]) [phone] ________________________________  Name: Kathryn Lucas MRN: [id number]  Date: 01/26/2018  DOB: Aug 17, 1960  End of Treatment Note  Diagnosis:   57 y.o. female with Stage IIA, cT3N0M0, pT3N1aM0, grade 1, ER/PR positive, invasive ductal carcinoma of the left breast  Indication for treatment:  Curative       Radiation treatment dates:   12/08/2017 - 01/26/2018  Site/dose:   The patient initially received a dose of 50.4 Gy in 28 fractions to the left chest wall and supraclavicular region. This was delivered using a 3-D conformal, 4 field technique. The patient then received a boost to the mastectomy scar. This delivered an additional 10 Gy in 5 fractions using an en face electron field. The total dose was 60.4 Gy.  Narrative: The patient tolerated radiation treatment relatively well.   The patient had some expected skin irritation with moderate erythema and dry desquamation as she progressed during treatment. Moist desquamation was not present at the end of treatment. She is using Radiaplex as directed. She also noted increased fatigue.  Plan: The patient has completed radiation treatment. The patient will return to radiation oncology clinic for routine followup in one month. I advised the patient to call or return sooner if they have any questions or concerns related to their recovery or treatment. ________________________________  Jodelle Gross, MD, PhD  This document serves as a record of services personally performed by Kyung Rudd, MD. It was created on his behalf by Rae Lips, a trained medical scribe. The creation of this record is based on the scribe's personal observations and the provider's statements to them. This document has been checked and approved by the attending provider.

## 2018-03-15 DIAGNOSIS — M9905 Segmental and somatic dysfunction of pelvic region: Secondary | ICD-10-CM | POA: Diagnosis not present

## 2018-03-15 DIAGNOSIS — M9903 Segmental and somatic dysfunction of lumbar region: Secondary | ICD-10-CM | POA: Diagnosis not present

## 2018-03-15 DIAGNOSIS — M5416 Radiculopathy, lumbar region: Secondary | ICD-10-CM | POA: Diagnosis not present

## 2018-03-28 ENCOUNTER — Ambulatory Visit: Payer: Commercial Managed Care - PPO | Admitting: Radiation Oncology

## 2018-03-30 ENCOUNTER — Telehealth: Payer: Self-pay | Admitting: Radiation Oncology

## 2018-03-30 DIAGNOSIS — Z760 Encounter for issue of repeat prescription: Secondary | ICD-10-CM | POA: Diagnosis not present

## 2018-03-30 NOTE — Telephone Encounter (Signed)
I called the patient after she had cancelled her 1 month follow up appointment. She was unavailable and LM asking her to call me back.

## 2018-04-05 ENCOUNTER — Telehealth: Payer: Self-pay | Admitting: Radiation Oncology

## 2018-04-05 NOTE — Telephone Encounter (Addendum)
I called the patient back to see how she was doing but she was unavailable. I LM asking her to call back so we can discuss her case.

## 2018-04-06 ENCOUNTER — Telehealth: Payer: Self-pay | Admitting: Radiation Oncology

## 2018-04-06 NOTE — Telephone Encounter (Signed)
LM again returning the patient's call.

## 2018-04-07 ENCOUNTER — Telehealth: Payer: Self-pay | Admitting: Radiation Oncology

## 2018-04-07 NOTE — Telephone Encounter (Signed)
I spoke with the patient regarding her skin and how she's been doing since radiation completed in November. Her appointments had to be rescheduled for follow up due to work schedules. She is pleased at how things are going. We discussed skin care considerations especially regarding sun exposure. She will call back if she is concerned for would like to be seen. Otherwise she will follow up with medical oncology in survivorship clinic in March 2020.

## 2018-04-13 DIAGNOSIS — M9903 Segmental and somatic dysfunction of lumbar region: Secondary | ICD-10-CM | POA: Diagnosis not present

## 2018-04-13 DIAGNOSIS — M5416 Radiculopathy, lumbar region: Secondary | ICD-10-CM | POA: Diagnosis not present

## 2018-04-13 DIAGNOSIS — M9905 Segmental and somatic dysfunction of pelvic region: Secondary | ICD-10-CM | POA: Diagnosis not present

## 2018-04-27 DIAGNOSIS — Z9012 Acquired absence of left breast and nipple: Secondary | ICD-10-CM | POA: Diagnosis not present

## 2018-04-27 DIAGNOSIS — Z923 Personal history of irradiation: Secondary | ICD-10-CM | POA: Diagnosis not present

## 2018-04-27 DIAGNOSIS — Z853 Personal history of malignant neoplasm of breast: Secondary | ICD-10-CM | POA: Diagnosis not present

## 2018-05-17 DIAGNOSIS — M9905 Segmental and somatic dysfunction of pelvic region: Secondary | ICD-10-CM | POA: Diagnosis not present

## 2018-05-17 DIAGNOSIS — M5416 Radiculopathy, lumbar region: Secondary | ICD-10-CM | POA: Diagnosis not present

## 2018-05-17 DIAGNOSIS — M9903 Segmental and somatic dysfunction of lumbar region: Secondary | ICD-10-CM | POA: Diagnosis not present

## 2018-05-18 ENCOUNTER — Telehealth: Payer: Self-pay

## 2018-05-18 NOTE — Telephone Encounter (Signed)
LVM for patient reminding of SCP visit with NP on 05/24/18 at 10 am.  Number to center LVM for questions about visit.

## 2018-05-24 ENCOUNTER — Telehealth: Payer: Self-pay | Admitting: Hematology and Oncology

## 2018-05-24 ENCOUNTER — Encounter: Payer: Self-pay | Admitting: Adult Health

## 2018-05-24 ENCOUNTER — Inpatient Hospital Stay: Payer: Commercial Managed Care - PPO | Attending: Adult Health | Admitting: Adult Health

## 2018-05-24 VITALS — BP 99/73 | HR 72 | Temp 98.8°F | Resp 19 | Ht 71.0 in | Wt 192.4 lb

## 2018-05-24 DIAGNOSIS — Z170 Estrogen receptor positive status [ER+]: Secondary | ICD-10-CM | POA: Insufficient documentation

## 2018-05-24 DIAGNOSIS — Z923 Personal history of irradiation: Secondary | ICD-10-CM | POA: Diagnosis not present

## 2018-05-24 DIAGNOSIS — C50512 Malignant neoplasm of lower-outer quadrant of left female breast: Secondary | ICD-10-CM | POA: Diagnosis not present

## 2018-05-24 DIAGNOSIS — E2839 Other primary ovarian failure: Secondary | ICD-10-CM

## 2018-05-24 DIAGNOSIS — Z79811 Long term (current) use of aromatase inhibitors: Secondary | ICD-10-CM | POA: Diagnosis not present

## 2018-05-24 DIAGNOSIS — E039 Hypothyroidism, unspecified: Secondary | ICD-10-CM

## 2018-05-24 DIAGNOSIS — Z1239 Encounter for other screening for malignant neoplasm of breast: Secondary | ICD-10-CM

## 2018-05-24 NOTE — Progress Notes (Signed)
CLINIC:  Survivorship   REASON FOR VISIT:  Routine follow-up post-treatment for a recent history of breast cancer.  BRIEF ONCOLOGIC HISTORY:    Malignant neoplasm of lower-outer quadrant of left breast of female, estrogen receptor positive (Kathryn Lucas)   06/11/2016 Mammogram    Palpable left breast masses 3:00 position: 2.2 cm; 5:30 position: 2.5 cm; 6:30 position: 0.7 cm    06/19/2016 Initial Diagnosis    Left breast biopsy 3:30: IDC with DCIS grade 1, ER 90%, PR 50%, Ki-67 15%, HER-2 negative ratio 1.13; biopsy 5:30 position: IDC grade 1    07/13/2016 Breast MRI    Large area of abnormal enhancement lower inner and lower outer quadrants left breast spanning 9 cm x 6.4 cm x 5.3 cm, no abnormal enlarged lymph nodes; T3 N0 stage II a (New AJCC staging)     07/15/2016 - 12/08/2017 Anti-estrogen oral therapy    Neoadjuvant anastrozole 1 mg daily    07/17/2016 Oncotype testing    Testing done on the biopsy: Oncotype DX score 22, intermediate risk    02/02/2017 Breast MRI    Left breast multicentric disease unchanged measuring 2.7 x 1.6 cm.  Mass in the non-mass enhancement are also not significantly changed measuring 6.2 x 2.4 cm. new enhancing mass within the outer right breast 7 mm which could be fat necrosis or inclusion cyst     02/09/2017 Imaging    Ultrasound of the right breast lesion noted on MRI: No sonographic finding corresponds to the abnormality noted on MRI    07/13/2017 Cancer Staging    Staging form: Breast, AJCC 8th Edition - Clinical stage from 07/13/2017: Stage IIA (cT3, cN0, cM0, G1, ER+, PR+, HER2-) - Signed by Gardenia Phlegm, NP on 05/18/2018    10/26/2017 Surgery    Left mastectomy: IDC grade 1, 2 foci largest spans 8.5 cm, intermediate grade DCIS, lymphovascular invasion identified, perineural invasion identified, 1/2 lymph nodes positive with extracapsular extension, ER 9200%, PR 5 to 50%, HER-2 negative, Ki-67 10 to 15%, T3N1A Mammaprint: low risk    11/02/2017  Cancer Staging    Staging form: Breast, AJCC 8th Edition - Pathologic: No Stage Recommended (ypT3, pN1a, cM0, G1, ER+, PR+, HER2-) - Signed by Nicholas Lose, MD on 11/02/2017    12/08/2017 - 01/26/2018 Radiation Therapy    Adjuvant radiation therapy     02/2018 -  Anti-estrogen oral therapy    Anastrozole 1 mg daily adjuvant therapy     INTERVAL HISTORY:  Kathryn Lucas presents to the [facility] today for our initial meeting to review her survivorship care plan detailing her treatment course for breast cancer, as well as monitoring long-term side effects of that treatment, education regarding health maintenance, screening, and overall wellness and health promotion.     Overall, Kathryn Lucas reports doing well other than her father passing away a few days ago.  She underwent reconstruction with Dr. Iran Planas and currently has an expander.  She will likely have an implant over the summer.  She is taking the Anastrozole. She says she is tolerating it well and denies arthralgias, hot flashes, or vaginal dryness.     REVIEW OF SYSTEMS:  Review of Systems  Constitutional: Negative for appetite change, chills, fatigue, fever and unexpected weight change.  HENT:   Negative for hearing loss, lump/mass, mouth sores, sore throat and trouble swallowing.   Eyes: Negative for eye problems and icterus.  Respiratory: Negative for chest tightness, cough and shortness of breath.   Cardiovascular: Negative for  chest pain, leg swelling and palpitations.  Gastrointestinal: Negative for abdominal distention, abdominal pain, constipation, diarrhea, nausea and vomiting.  Endocrine: Negative for hot flashes.  Genitourinary: Negative for difficulty urinating.   Musculoskeletal: Negative for arthralgias.  Skin: Negative for itching and rash.  Neurological: Negative for dizziness, extremity weakness, headaches and numbness.  Hematological: Negative for adenopathy. Does not bruise/bleed easily.    Psychiatric/Behavioral: Negative for depression. The patient is not nervous/anxious.   Breast: Denies any new nodularity, masses, tenderness, nipple changes, or nipple discharge.      ONCOLOGY TREATMENT TEAM:  1. Surgeon:  Dr. Barry Dienes at [facility] 2. Medical Oncologist: Dr. Lindi Adie  3. Radiation Oncologist: Dr. Lisbeth Renshaw    PAST MEDICAL/SURGICAL HISTORY:  Past Medical History:  Diagnosis Date  . Cancer ([place]) 09/2017   left breast cancer  . Hypothyroidism   . Thyroid disease    Past Surgical History:  Procedure Laterality Date  . BREAST RECONSTRUCTION WITH PLACEMENT OF TISSUE EXPANDER AND ALLODERM Left 10/26/2017   Procedure: LEFT BREAST RECONSTRUCTION WITH PLACEMENT OF TISSUE EXPANDER AND ALLODERM;  Surgeon: Irene Limbo, MD;  Location: [facility];  Service: Plastics;  Laterality: Left;  Marland Kitchen MASTECTOMY WITH RADIOACTIVE SEED GUIDED EXCISION AND AXILLARY SENTINEL LYMPH NODE BIOPSY Left 10/26/2017   Procedure: LEFT MASTECTOMY WITH SEED TARGETED  LEFT AXILLARY LYMPH NODE EXCISION AND LEFT SENTINEL LYMPH NODE BIOPSY;  Surgeon: Stark Klein, MD;  Location: [facility];  Service: General;  Laterality: Left;  . TONSILLECTOMY    . WISDOM TOOTH EXTRACTION       ALLERGIES:  No Known Allergies   CURRENT MEDICATIONS:  Outpatient Encounter Medications as of 05/24/2018  Medication Sig Note  . anastrozole (ARIMIDEX) 1 MG tablet Take 1 tablet (1 mg total) by mouth daily.   Marland Kitchen levothyroxine (SYNTHROID, LEVOTHROID) 175 MCG tablet Take 112 mcg by mouth.  07/30/2015: Received from: [facility]  . fluticasone (FLONASE) 50 MCG/ACT nasal spray Place into the nose. 07/30/2015: Received from: [facility]   No facility-administered encounter medications on file as of 05/24/2018.      ONCOLOGIC FAMILY HISTORY:  Family History  Problem Relation Age of Onset  . Breast cancer Mother      GENETIC COUNSELING/TESTING: Not at  this time  SOCIAL HISTORY:  Social History   Socioeconomic History  . Marital status: Single    Spouse name: Not on file  . Number of children: Not on file  . Years of education: Not on file  . Highest education level: Not on file  Occupational History  . Not on file  Social Needs  . Financial resource strain: Not on file  . Food insecurity:    Worry: Not on file    Inability: Not on file  . Transportation needs:    Medical: Not on file    Non-medical: Not on file  Tobacco Use  . Smoking status: Never Smoker  . Smokeless tobacco: Never Used  Substance and Sexual Activity  . Alcohol use: Yes    Alcohol/week: 0.0 standard drinks    Comment: social  . Drug use: Never  . Sexual activity: Not on file  Lifestyle  . Physical activity:    Days per week: Not on file    Minutes per session: Not on file  . Stress: Not on file  Relationships  . Social connections:    Talks on phone: Not on file    Gets together: Not on file  Attends religious service: Not on file    Active member of club or organization: Not on file    Attends meetings of clubs or organizations: Not on file    Relationship status: Not on file  . Intimate partner violence:    Fear of current or ex partner: No    Emotionally abused: No    Physically abused: No    Forced sexual activity: No  Other Topics Concern  . Not on file  Social History Narrative  . Not on file        PHYSICAL EXAMINATION:  Vital Signs:   Vitals:   05/24/18 1028  BP: 99/73  Pulse: 72  Resp: 19  Temp: 98.8 F (37.1 C)  SpO2: 98%   Filed Weights   05/24/18 1028  Weight: 192 lb 6.4 oz (87.3 kg)   General: Well-nourished, well-appearing female in no acute distress.  She is unaccompanied today.   HEENT: Head is normocephalic.  Pupils equal and reactive to light. Conjunctivae clear without exudate.  Sclerae anicteric. Oral mucosa is pink, moist.  Oropharynx is pink without lesions or erythema.  Lymph: No cervical,  supraclavicular, or infraclavicular lymphadenopathy noted on palpation.  Cardiovascular: Regular rate and rhythm.Marland Kitchen Respiratory: Clear to auscultation bilaterally. Chest expansion symmetric; breathing non-labored.  Breasts: right breast is benign, left breast s/p mastectomy, reconstruction with expander in place and hyperpigmentation from radiation, no sign of local recurrence GI: Abdomen soft and round; non-tender, non-distended. Bowel sounds normoactive.  GU: Deferred.  Neuro: No focal deficits. Steady gait.  Psych: Mood and affect normal and appropriate for situation.  Extremities: No edema. MSK: No focal spinal tenderness to palpation.  Full range of motion in bilateral upper extremities Skin: Warm and dry.  LABORATORY DATA:  None for this visit.  DIAGNOSTIC IMAGING:  None for this visit.      ASSESSMENT AND PLAN:  Ms.. Brekke is a pleasant 58 y.o. female with Stage IIA left breast invasive ductal carcinoma, ER+/PR+/HER2-, diagnosed in 06/2016, treated with neoadjuvant anastrozole, mastectomy, adjuvant radiation therapy, and anti-estrogen therapy with Anastrozole beginning in 02/2018.  She presents to the Survivorship Clinic for our initial meeting and routine follow-up post-completion of treatment for breast cancer.    1. Stage IIA left breast cancer:  Ms. Easler is continuing to recover from definitive treatment for breast cancer. She will follow-up with her medical oncologist, Dr. Lindi Adie in 6 months with history and physical exam per surveillance protocol.  She will continue her anti-estrogen therapy with Anastrozole. Thus far, she is tolerating the Anastrozole well, with minimal side effects.  She requested for a genetic counseling referral today to perhaps discuss genetic testing and potential out of pocket costs.  I placed that referral today.    Today, a comprehensive survivorship care plan and treatment summary was reviewed with the patient today detailing her breast cancer diagnosis,  treatment course, potential late/long-term effects of treatment, appropriate follow-up care with recommendations for the future, and patient education resources.  A copy of this summary, along with a letter will be sent to the patient's primary care provider via mail/fax/In Basket message after today's visit.    2. Bone health:  Given Ms. Nessler age/history of breast cancer and her current treatment regimen including anti-estrogen therapy with Anastrozole, she is at risk for bone demineralization.  Her last DEXA scan was several years ago and it was with [facility].  She is unsure   She was given education on specific activities to promote bone health.  3. Cancer screening:  Due to Ms. Schellhorn history and her age, she should receive screening for skin cancers, colon cancer, and gynecologic cancers.  The information and recommendations are listed on the patient's comprehensive care plan/treatment summary and were reviewed in detail with the patient.    4. Health maintenance and wellness promotion: Ms. Anthis was encouraged to consume 5-7 servings of fruits and vegetables per day. We reviewed the "Nutrition Rainbow" handout, as well as the handout "Take Control of Your Health and Reduce Your Cancer Risk" from the [employer].  She was also encouraged to engage in moderate to vigorous exercise for 30 minutes per day most days of the week. We discussed the [employer] fitness program, which is designed for cancer survivors to help them become more physically fit after cancer treatments.  She was instructed to limit her alcohol consumption and continue to abstain from tobacco use.     5. Support services/counseling: It is not uncommon for this period of the patient's cancer care trajectory to be one of many emotions and stressors.  We discussed an opportunity for her to participate in the next session of [facility] ("Finding Your New Normal") support group series designed for patients after they have  completed treatment.   Ms. Puccio was encouraged to take advantage of our many other support services programs, support groups, and/or counseling in coping with her new life as a cancer survivor after completing anti-cancer treatment.  She was offered support today through active listening and expressive supportive counseling.  She was given information regarding our available services and encouraged to contact me with any questions or for help enrolling in any of our support group/programs.    Dispo:   -Return to cancer center for follow up in 6 months with Dr. Lindi Adie  -Right breast screening mammogram due in 07/2018 -Follow up with Dr. Barry Dienes in 05/2019 -She is welcome to return back to the Survivorship Clinic at any time; no additional follow-up needed at this time.  -Consider referral back to survivorship as a long-term survivor for continued surveillance  A total of (30) minutes of face-to-face time was spent with this patient with greater than 50% of that time in counseling and care-coordination.   Gardenia Phlegm, [place] [phone]   Note: PRIMARY CARE PROVIDER Kelton Pillar, [place] [phone]

## 2018-05-24 NOTE — Telephone Encounter (Signed)
Gave avs and calendar ° °

## 2018-06-09 ENCOUNTER — Telehealth: Payer: Self-pay | Admitting: Genetic Counselor

## 2018-06-09 NOTE — Telephone Encounter (Signed)
LM on VM that we need to r/s her appointment and to please CB

## 2018-06-10 NOTE — Telephone Encounter (Signed)
Scheduled a WebEx genetic counseling visit instead of an in person meeting.

## 2018-06-14 ENCOUNTER — Inpatient Hospital Stay: Payer: Commercial Managed Care - PPO

## 2018-06-14 ENCOUNTER — Encounter: Payer: Self-pay | Admitting: Genetic Counselor

## 2018-06-14 ENCOUNTER — Inpatient Hospital Stay (HOSPITAL_BASED_OUTPATIENT_CLINIC_OR_DEPARTMENT_OTHER): Payer: Commercial Managed Care - PPO | Admitting: Genetic Counselor

## 2018-06-14 ENCOUNTER — Other Ambulatory Visit: Payer: Self-pay

## 2018-06-14 DIAGNOSIS — C50512 Malignant neoplasm of lower-outer quadrant of left female breast: Secondary | ICD-10-CM

## 2018-06-14 DIAGNOSIS — Z803 Family history of malignant neoplasm of breast: Secondary | ICD-10-CM | POA: Diagnosis not present

## 2018-06-14 DIAGNOSIS — Z170 Estrogen receptor positive status [ER+]: Secondary | ICD-10-CM | POA: Diagnosis not present

## 2018-06-14 DIAGNOSIS — Z853 Personal history of malignant neoplasm of breast: Secondary | ICD-10-CM | POA: Diagnosis not present

## 2018-06-14 NOTE — Progress Notes (Signed)
REFERRING PROVIDER: Gardenia Phlegm, NP [address] [place], [place] [address]  PRIMARY PROVIDER:  Kelton Pillar, MD  PRIMARY REASON FOR VISIT:  1. Malignant neoplasm of lower-outer quadrant of left breast of female, estrogen receptor positive ([place])   2. Family history of breast cancer      HISTORY OF PRESENT ILLNESS:   Ms. Linehan, a 58 y.o. female, was seen for a [facility] cancer genetics Webex tele-consultation at the request of Dr. Delice Bison due to a personal and family history of breast cancer.  Ms. Putnam presents to clinic today to discuss the possibility of a hereditary predisposition to cancer, genetic testing, and to further clarify her future cancer risks, as well as potential cancer risks for family members.   In 2018, at the age of 74, Ms. Maravilla was diagnosed with cancer of the left breast. The treatment plan included a double mastectomy, and radiation.  She was also placed on anti-estrogen therapy. Her sister is concerned about the risk for a hereditary cancer syndrome, so we are discussing her risks at this time.     CANCER HISTORY:    Malignant neoplasm of lower-outer quadrant of left breast of female, estrogen receptor positive ([place])   06/11/2016 Mammogram    Palpable left breast masses 3:00 position: 2.2 cm; 5:30 position: 2.5 cm; 6:30 position: 0.7 cm    06/19/2016 Initial Diagnosis    Left breast biopsy 3:30: IDC with DCIS grade 1, ER 90%, PR 50%, Ki-67 15%, HER-2 negative ratio 1.13; biopsy 5:30 position: IDC grade 1    07/13/2016 Breast MRI    Large area of abnormal enhancement lower inner and lower outer quadrants left breast spanning 9 cm x 6.4 cm x 5.3 cm, no abnormal enlarged lymph nodes; T3 N0 stage II a (New AJCC staging)     07/15/2016 - 12/08/2017 Anti-estrogen oral therapy    Neoadjuvant anastrozole 1 mg daily    07/17/2016 Oncotype testing    Testing done on the biopsy: Oncotype DX score 22, intermediate risk    02/02/2017 Breast MRI    Left  breast multicentric disease unchanged measuring 2.7 x 1.6 cm.  Mass in the non-mass enhancement are also not significantly changed measuring 6.2 x 2.4 cm. new enhancing mass within the outer right breast 7 mm which could be fat necrosis or inclusion cyst     02/09/2017 Imaging    Ultrasound of the right breast lesion noted on MRI: No sonographic finding corresponds to the abnormality noted on MRI    07/13/2017 Cancer Staging    Staging form: Breast, AJCC 8th Edition - Clinical stage from 07/13/2017: Stage IIA (cT3, cN0, cM0, G1, ER+, PR+, HER2-) - Signed by Gardenia Phlegm, NP on 05/18/2018    10/26/2017 Surgery    Left mastectomy: IDC grade 1, 2 foci largest spans 8.5 cm, intermediate grade DCIS, lymphovascular invasion identified, perineural invasion identified, 1/2 lymph nodes positive with extracapsular extension, ER 9200%, PR 5 to 50%, HER-2 negative, Ki-67 10 to 15%, T3N1A Mammaprint: low risk    11/02/2017 Cancer Staging    Staging form: Breast, AJCC 8th Edition - Pathologic: No Stage Recommended (ypT3, pN1a, cM0, G1, ER+, PR+, HER2-) - Signed by Nicholas Lose, MD on 11/02/2017    12/08/2017 - 01/26/2018 Radiation Therapy    Adjuvant radiation therapy     02/2018 -  Anti-estrogen oral therapy    Anastrozole 1 mg daily adjuvant therapy      RISK FACTORS:  Menarche was at age 28.  First live birth at age N/A.  Ovaries intact: yes.  Hysterectomy: no.  Menopausal status: postmenopausal.  HRT use: 0 years. Colonoscopy: no; not examined. Mammogram within the last year: no. Number of breast biopsies: 3. Up to date with pelvic exams: yes. Any excessive radiation exposure in the past: yes  Past Medical History:  Diagnosis Date  . Cancer ([place]) 09/2017   left breast cancer  . Family history of breast cancer   . Hypothyroidism   . Thyroid disease     Past Surgical History:  Procedure Laterality Date  . BREAST RECONSTRUCTION WITH PLACEMENT OF TISSUE EXPANDER AND ALLODERM  Left 10/26/2017   Procedure: LEFT BREAST RECONSTRUCTION WITH PLACEMENT OF TISSUE EXPANDER AND ALLODERM;  Surgeon: Irene Limbo, MD;  Location: [facility];  Service: Plastics;  Laterality: Left;  Marland Kitchen MASTECTOMY WITH RADIOACTIVE SEED GUIDED EXCISION AND AXILLARY SENTINEL LYMPH NODE BIOPSY Left 10/26/2017   Procedure: LEFT MASTECTOMY WITH SEED TARGETED  LEFT AXILLARY LYMPH NODE EXCISION AND LEFT SENTINEL LYMPH NODE BIOPSY;  Surgeon: Stark Klein, MD;  Location: [facility];  Service: General;  Laterality: Left;  . TONSILLECTOMY    . WISDOM TOOTH EXTRACTION      Social History   Socioeconomic History  . Marital status: Single    Spouse name: Not on file  . Number of children: Not on file  . Years of education: Not on file  . Highest education level: Not on file  Occupational History  . Not on file  Social Needs  . Financial resource strain: Not on file  . Food insecurity:    Worry: Not on file    Inability: Not on file  . Transportation needs:    Medical: Not on file    Non-medical: Not on file  Tobacco Use  . Smoking status: Never Smoker  . Smokeless tobacco: Never Used  Substance and Sexual Activity  . Alcohol use: Yes    Alcohol/week: 0.0 standard drinks    Comment: social  . Drug use: Never  . Sexual activity: Not on file  Lifestyle  . Physical activity:    Days per week: Not on file    Minutes per session: Not on file  . Stress: Not on file  Relationships  . Social connections:    Talks on phone: Not on file    Gets together: Not on file    Attends religious service: Not on file    Active member of club or organization: Not on file    Attends meetings of clubs or organizations: Not on file    Relationship status: Not on file  Other Topics Concern  . Not on file  Social History Narrative  . Not on file     FAMILY HISTORY:  We obtained a detailed, 4-generation family history.  Significant diagnoses are listed below: Family History   Problem Relation Age of Onset  . Breast cancer Mother 5  . Stroke Sister        thought to be due to tamoxifen use  . Dementia Maternal Grandmother   . COPD Paternal Grandfather     The patient does not have children.  She has a brother and sister who are cancer free.  Her mother is living and her father is deceased.  The patient's father died from complications of a perforated stomach.  He has one brother who is living and cancer free.  The paternal grandparents are deceased from non cancer related issues.  The patient's mother is  living.  She had breast cancer in her early 80's.  She was an only child. Her parents are deceased from non cancer related issues.  Ms. Ellwanger is unaware of previous family history of genetic testing for hereditary cancer risks. Patient's maternal ancestors are of [place] and [place] descent, and paternal ancestors are of [place] descent. There is no reported Ashkenazi Jewish ancestry. There is no known consanguinity.  GENETIC COUNSELING ASSESSMENT: Ms. Kneece is a 58 y.o. female with a personal and family history of breast cancer which is somewhat suggestive of a familial form of breast cancer, and less likely a hereditary predisposition to cancer. We, therefore, discussed and recommended the following at today's visit.   DISCUSSION: We discussed that 5 - 10% of breast cancer is hereditary, with most cases associated with BRCA mutations.  There are other genes that can be associated with hereditary breast cancer syndromes.  Based on the age of onset of she and her mother, the breast cancer is more likely to fall into a familial form of cancer rather than a hereditary cancer syndrome.  However, the maternal side of the family is somewhat limited in that her mother did not have siblings.   We discussed with Ms. Kingma that the personal and family history does not meet insurance or NCCN criteria for genetic testing and, therefore, is not highly consistent with a familial  hereditary cancer syndrome.  We feel she is at low risk to harbor a gene mutation associated with such a condition. Thus, we did not recommend any genetic testing, at this time, and recommended Ms. Schwer continue to follow the cancer screening guidelines given by her primary healthcare provider.  Ms. Petrey will talk with her sister regarding genetic testing.  She may decide to undergo genetic testing and pay the $250 out of pocket cost for testing.  Ms. Westfall will contact me about her decision on whether to proceed or not.  In order to estimate her chance of having a BRCA mutation, we used statistical models (BRCAPro) that consider her personal medical history, family history and ancestry.  Because each model is different, there can be a lot of variability in the risks they give.  Therefore, these numbers must be considered a rough range and not a precise risk of having a BRCA mutation.  These models estimate that she has approximately a 0.36% chance of having a mutation. Based on this assessment of her family and personal history, genetic testing is not recommended.   PLAN: Ms. Braver did not wish to pursue genetic testing at today's visit, and instead discuss this with her sister. We understand this decision and remain available to coordinate genetic testing at any time in the future. We, therefore, recommend Ms. Custard continue to follow the cancer screening guidelines given by her primary healthcare provider.  Lastly, we encouraged Ms. Bridgewater to remain in contact with cancer genetics annually so that we can continuously update the family history and inform her of any changes in cancer genetics and testing that may be of benefit for this family.   Ms. Blalock questions were answered to her satisfaction today. Our contact information was provided should additional questions or concerns arise. Thank you for the referral and allowing [place] to share in the care of your patient.   Ryer Asato P. Florene Glen, [place], [facility] Certified  Genetic Counselor [email or web address] phone: [phone]  The patient was seen for a total of 37 minutes in face-to-face genetic counseling.  This patient was discussed with Drs. Magrinat, Lindi Adie  and/or Burr Medico who agrees with the above.    _______________________________________________________________________ For Office Staff:  Number of people involved in session: 1 Was an Intern/ student involved with case: no

## 2018-07-11 DIAGNOSIS — M9905 Segmental and somatic dysfunction of pelvic region: Secondary | ICD-10-CM | POA: Diagnosis not present

## 2018-07-11 DIAGNOSIS — M9903 Segmental and somatic dysfunction of lumbar region: Secondary | ICD-10-CM | POA: Diagnosis not present

## 2018-07-11 DIAGNOSIS — M5416 Radiculopathy, lumbar region: Secondary | ICD-10-CM | POA: Diagnosis not present

## 2018-07-27 DIAGNOSIS — M9905 Segmental and somatic dysfunction of pelvic region: Secondary | ICD-10-CM | POA: Diagnosis not present

## 2018-07-27 DIAGNOSIS — M9903 Segmental and somatic dysfunction of lumbar region: Secondary | ICD-10-CM | POA: Diagnosis not present

## 2018-07-27 DIAGNOSIS — M5416 Radiculopathy, lumbar region: Secondary | ICD-10-CM | POA: Diagnosis not present

## 2018-07-29 DIAGNOSIS — M9903 Segmental and somatic dysfunction of lumbar region: Secondary | ICD-10-CM | POA: Diagnosis not present

## 2018-07-29 DIAGNOSIS — M5416 Radiculopathy, lumbar region: Secondary | ICD-10-CM | POA: Diagnosis not present

## 2018-07-29 DIAGNOSIS — M9905 Segmental and somatic dysfunction of pelvic region: Secondary | ICD-10-CM | POA: Diagnosis not present

## 2018-08-09 ENCOUNTER — Ambulatory Visit: Payer: Commercial Managed Care - PPO

## 2018-08-09 ENCOUNTER — Other Ambulatory Visit: Payer: Commercial Managed Care - PPO

## 2018-09-20 ENCOUNTER — Ambulatory Visit: Payer: Commercial Managed Care - PPO

## 2018-09-20 ENCOUNTER — Other Ambulatory Visit: Payer: Commercial Managed Care - PPO

## 2018-11-08 ENCOUNTER — Other Ambulatory Visit: Payer: Self-pay | Admitting: Family Medicine

## 2018-11-08 ENCOUNTER — Other Ambulatory Visit (HOSPITAL_COMMUNITY)
Admission: RE | Admit: 2018-11-08 | Discharge: 2018-11-08 | Disposition: A | Discharge status: Home / Self Care | Payer: Commercial Managed Care - PPO | Source: Ambulatory Visit | Attending: Family Medicine | Admitting: Family Medicine

## 2018-11-08 DIAGNOSIS — Z124 Encounter for screening for malignant neoplasm of cervix: Secondary | ICD-10-CM | POA: Insufficient documentation

## 2018-11-22 ENCOUNTER — Ambulatory Visit
Admission: RE | Admit: 2018-11-22 | Discharge: 2018-11-22 | Disposition: A | Discharge status: Home / Self Care | Payer: Commercial Managed Care - PPO | Source: Ambulatory Visit | Attending: Adult Health | Admitting: Adult Health

## 2018-11-22 ENCOUNTER — Telehealth: Payer: Self-pay

## 2018-11-22 ENCOUNTER — Other Ambulatory Visit: Payer: Self-pay

## 2018-11-22 DIAGNOSIS — Z170 Estrogen receptor positive status [ER+]: Secondary | ICD-10-CM

## 2018-11-22 DIAGNOSIS — Z1239 Encounter for other screening for malignant neoplasm of breast: Secondary | ICD-10-CM

## 2018-11-22 DIAGNOSIS — E2839 Other primary ovarian failure: Secondary | ICD-10-CM

## 2018-11-22 DIAGNOSIS — C50512 Malignant neoplasm of lower-outer quadrant of left female breast: Secondary | ICD-10-CM

## 2018-11-22 NOTE — Telephone Encounter (Signed)
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  Recommend calcium, vitamin d, and weight bearing exercises.  She should have repeat testing in 2 years.   ----- Message ----- From: Interface, Rad Results In Sent: 11/22/2018   8:16 AM EDT To: Gardenia Phlegm, NP

## 2018-11-22 NOTE — Telephone Encounter (Signed)
Spoke with patient to inform of BD results that show early osteopenia.  Per NP recommendation take calcium, vitamin d and do weight bearing exercises.  Patient voiced understanding and thanks for call.

## 2018-11-23 NOTE — Progress Notes (Signed)
Patient Care Team: Kelton Pillar, MD as PCP - General (Family Medicine) Nicholas Lose, MD as Consulting Physician (Hematology and Oncology) Stark Klein, MD as Consulting Physician (General Surgery) Kyung Rudd, MD as Consulting Physician (Radiation Oncology)  DIAGNOSIS:    ICD-10-CM   1. Malignant neoplasm of lower-outer quadrant of left breast of female, estrogen receptor positive ([place])  C50.512    Z17.0     SUMMARY OF ONCOLOGIC HISTORY: Oncology History  Malignant neoplasm of lower-outer quadrant of left breast of female, estrogen receptor positive ([place])  06/11/2016 Mammogram   Palpable left breast masses 3:00 position: 2.2 cm; 5:30 position: 2.5 cm; 6:30 position: 0.7 cm   06/19/2016 Initial Diagnosis   Left breast biopsy 3:30: IDC with DCIS grade 1, ER 90%, PR 50%, Ki-67 15%, HER-2 negative ratio 1.13; biopsy 5:30 position: IDC grade 1   07/13/2016 Breast MRI   Large area of abnormal enhancement lower inner and lower outer quadrants left breast spanning 9 cm x 6.4 cm x 5.3 cm, no abnormal enlarged lymph nodes; T3 N0 stage II a (New AJCC staging)    07/15/2016 - 12/08/2017 Anti-estrogen oral therapy   Neoadjuvant anastrozole 1 mg daily   07/17/2016 Oncotype testing   Testing done on the biopsy: Oncotype DX score 22, intermediate risk   02/02/2017 Breast MRI   Left breast multicentric disease unchanged measuring 2.7 x 1.6 cm.  Mass in the non-mass enhancement are also not significantly changed measuring 6.2 x 2.4 cm. new enhancing mass within the outer right breast 7 mm which could be fat necrosis or inclusion cyst    02/09/2017 Imaging   Ultrasound of the right breast lesion noted on MRI: No sonographic finding corresponds to the abnormality noted on MRI   07/13/2017 Cancer Staging   Staging form: Breast, AJCC 8th Edition - Clinical stage from 07/13/2017: Stage IIA (cT3, cN0, cM0, G1, ER+, PR+, HER2-) - Signed by Gardenia Phlegm, NP on 05/18/2018   10/26/2017 Surgery     Left mastectomy: IDC grade 1, 2 foci largest spans 8.5 cm, intermediate grade DCIS, lymphovascular invasion identified, perineural invasion identified, 1/2 lymph nodes positive with extracapsular extension, ER 9200%, PR 5 to 50%, HER-2 negative, Ki-67 10 to 15%, T3N1A Mammaprint: low risk   11/02/2017 Cancer Staging   Staging form: Breast, AJCC 8th Edition - Pathologic: No Stage Recommended (ypT3, pN1a, cM0, G1, ER+, PR+, HER2-) - Signed by Nicholas Lose, MD on 11/02/2017   12/08/2017 - 01/26/2018 Radiation Therapy   Adjuvant radiation therapy    02/2018 -  Anti-estrogen oral therapy   Anastrozole 1 mg daily adjuvant therapy     CHIEF COMPLIANT: Follow-up of left breast cancer on anastrozole  INTERVAL HISTORY: Kathryn Lucas is a 58 y.o. with above-mentioned history of left breast cancer treated with mastectomy, radiation therapy, and is currently on anti-estrogen therapy with anastrozole. Mammogram of the right breast on 11/22/18 showed no evidence of malignancy. Bone density scan on 11/22/18 showed osteopenia with a T-score of -1.1. She presents to the clinic today for annual follow-up.   REVIEW OF SYSTEMS:   Constitutional: Denies fevers, chills or abnormal weight loss Eyes: Denies blurriness of vision Ears, nose, mouth, throat, and face: Denies mucositis or sore throat Respiratory: Denies cough, dyspnea or wheezes Cardiovascular: Denies palpitation, chest discomfort Gastrointestinal: Denies nausea, heartburn or change in bowel habits Skin: Denies abnormal skin rashes Lymphatics: Denies new lymphadenopathy or easy bruising Neurological: Denies numbness, tingling or new weaknesses Behavioral/Psych: Mood is stable, no new  changes  Extremities: No lower extremity edema Breast: s/p left mastectomy  All other systems were reviewed with the patient and are negative.  I have reviewed the past medical history, past surgical history, social history and family history with the patient and they  are unchanged from previous note.  ALLERGIES:  has No Known Allergies.  MEDICATIONS:  Current Outpatient Medications  Medication Sig Dispense Refill   anastrozole (ARIMIDEX) 1 MG tablet Take 1 tablet (1 mg total) by mouth daily. 90 tablet 3   fluticasone (FLONASE) 50 MCG/ACT nasal spray Place into the nose.     levothyroxine (SYNTHROID, LEVOTHROID) 175 MCG tablet Take 112 mcg by mouth.      No current facility-administered medications for this visit.     PHYSICAL EXAMINATION: ECOG PERFORMANCE STATUS: 0 - Asymptomatic  Vitals:   11/24/18 1050  BP: (!) 104/40  Pulse: 61  Resp: 18  Temp: 98.5 F (36.9 C)  SpO2: 98%   Filed Weights   11/24/18 1050  Weight: 187 lb 6.4 oz (85 kg)    GENERAL: alert, no distress and comfortable SKIN: skin color, texture, turgor are normal, no rashes or significant lesions EYES: normal, Conjunctiva are pink and non-injected, sclera clear OROPHARYNX: no exudate, no erythema and lips, buccal mucosa, and tongue normal  NECK: supple, thyroid normal size, non-tender, without nodularity LYMPH: no palpable lymphadenopathy in the cervical, axillary or inguinal LUNGS: clear to auscultation and percussion with normal breathing effort HEART: regular rate & rhythm and no murmurs and no lower extremity edema ABDOMEN: abdomen soft, non-tender and normal bowel sounds MUSCULOSKELETAL: no cyanosis of digits and no clubbing  NEURO: alert & oriented x 3 with fluent speech, no focal motor/sensory deficits EXTREMITIES: No lower extremity edema BREAST: No palpable masses or nodules in either right or left breasts. No palpable axillary supraclavicular or infraclavicular adenopathy no breast tenderness or nipple discharge. (exam performed in the presence of a chaperone)  LABORATORY DATA:  I have reviewed the data as listed No flowsheet data found.  No results found for: WBC, HGB, HCT, MCV, PLT, NEUTROABS  ASSESSMENT & PLAN:  Malignant neoplasm of lower-outer  quadrant of left breast of female, estrogen receptor positive ([place]) 06/19/2016 Left breast biopsy 3:30: IDC with DCIS grade 1, ER 90%, PR 50%, Ki-67 15%, HER-2 negative ratio 1.13; biopsy 5:30 position: IDC grade 1  10/26/17:Left mastectomy: IDC grade 1, 2 foci largest spans 8.5 cm, intermediate grade DCIS, lymphovascular invasion identified, perineural invasion identified, 1/2 lymph nodes positive with extracapsular extension, ER 9200%, PR 5 to 50%, HER-2 negative, Ki-67 10 to 15%, T3N1A  Oncotype DXscore 22, intermediate risk, chemotherapy not felt to have significant benefit.  Treatment plan: 1. Antiestrogen therapy with anastrozole 1 mg daily started 07/15/2016 2. Mastectomy 10/26/2017, Mammaprint low risk luminal type A 3. Followed by adjuvant radiation 12/08/17- 01/26/18  4. Followed by adjuvant antiestrogen therapy -------------------------------------------------------------------- Current treatment: Resumption of anastrozole 1 mg daily 01/17/2018 (originally started 07/15/2016) Anastrozole toxicities: No major side effects of anastrozole therapy.   Breast cancer surveillance: 1.  Right breast mammogram 11/22/2018: Benign breast density category B 2.  Bone density 11/22/2018: T score -1.1: Osteopenia: Continue with calcium and vitamin D and weightbearing exercises.  I renewed the prescription for anastrozole for another year. Return to clinic in 1 year for follow-up.    No orders of the defined types were placed in this encounter.  The patient has a good understanding of the overall plan. she agrees with it. she will call with  any problems that may develop before the next visit here.  Nicholas Lose, MD 11/24/2018  Julious Oka Dorshimer am acting as scribe for Dr. Nicholas Lose.  I have reviewed the above documentation for accuracy and completeness, and I agree with the above.

## 2018-11-24 ENCOUNTER — Inpatient Hospital Stay: Payer: Commercial Managed Care - PPO | Attending: Hematology and Oncology | Admitting: Hematology and Oncology

## 2018-11-24 ENCOUNTER — Other Ambulatory Visit: Payer: Self-pay

## 2018-11-24 DIAGNOSIS — Z923 Personal history of irradiation: Secondary | ICD-10-CM | POA: Diagnosis not present

## 2018-11-24 DIAGNOSIS — M8580 Other specified disorders of bone density and structure, unspecified site: Secondary | ICD-10-CM | POA: Insufficient documentation

## 2018-11-24 DIAGNOSIS — Z79899 Other long term (current) drug therapy: Secondary | ICD-10-CM | POA: Insufficient documentation

## 2018-11-24 DIAGNOSIS — Z79811 Long term (current) use of aromatase inhibitors: Secondary | ICD-10-CM | POA: Insufficient documentation

## 2018-11-24 DIAGNOSIS — C50512 Malignant neoplasm of lower-outer quadrant of left female breast: Secondary | ICD-10-CM | POA: Insufficient documentation

## 2018-11-24 DIAGNOSIS — Z170 Estrogen receptor positive status [ER+]: Secondary | ICD-10-CM | POA: Insufficient documentation

## 2018-11-24 MED ORDER — ANASTROZOLE 1 MG PO TABS: 1.0000 mg | ORAL_TABLET | Freq: Every day | ORAL | 3 refills | Status: DC

## 2018-11-24 NOTE — Assessment & Plan Note (Signed)
06/19/2016 Left breast biopsy 3:30: IDC with DCIS grade 1, ER 90%, PR 50%, Ki-67 15%, HER-2 negative ratio 1.13; biopsy 5:30 position: IDC grade 1  10/26/17:Left mastectomy: IDC grade 1, 2 foci largest spans 8.5 cm, intermediate grade DCIS, lymphovascular invasion identified, perineural invasion identified, 1/2 lymph nodes positive with extracapsular extension, ER 9200%, PR 5 to 50%, HER-2 negative, Ki-67 10 to 15%, T3N1A  Oncotype DXscore 22, intermediate risk, chemotherapy not felt to have significant benefit.  Treatment plan: 1. Antiestrogen therapy with anastrozole 1 mg daily started 07/15/2016 2. Mastectomy 10/26/2017, Mammaprint low risk luminal type A 3. Followed by adjuvant radiation 12/08/17- 01/26/18  4. Followed by adjuvant antiestrogen therapy -------------------------------------------------------------------- Current treatment: Resumption of anastrozole 1 mg daily 01/17/2018 (originally started 07/15/2016) Anastrozole toxicities: No major side effects of anastrozole therapy.   Breast cancer surveillance: 1.  Right breast mammogram 11/22/2018: Benign breast density category B 2.  Bone density 11/22/2018: T score -1.1: Osteopenia: Continue with calcium and vitamin D and weightbearing exercises.  I renewed the prescription for anastrozole for another year. Return to clinic in 1 year for follow-up.

## 2018-12-06 LAB — CYTOLOGY - PAP: Diagnosis: NEGATIVE

## 2019-03-23 ENCOUNTER — Ambulatory Visit: Payer: Commercial Managed Care - PPO | Attending: Internal Medicine

## 2019-03-23 DIAGNOSIS — Z20822 Contact with and (suspected) exposure to covid-19: Secondary | ICD-10-CM

## 2019-03-25 LAB — NOVEL CORONAVIRUS, NAA: SARS-CoV-2, NAA: NOT DETECTED

## 2019-04-19 ENCOUNTER — Ambulatory Visit: Payer: Commercial Managed Care - PPO | Attending: Internal Medicine

## 2019-04-19 DIAGNOSIS — Z20822 Contact with and (suspected) exposure to covid-19: Secondary | ICD-10-CM

## 2019-04-20 LAB — NOVEL CORONAVIRUS, NAA: SARS-CoV-2, NAA: NOT DETECTED

## 2019-05-16 ENCOUNTER — Ambulatory Visit: Payer: Commercial Managed Care - PPO | Attending: Internal Medicine

## 2019-05-16 DIAGNOSIS — Z20822 Contact with and (suspected) exposure to covid-19: Secondary | ICD-10-CM

## 2019-05-17 LAB — NOVEL CORONAVIRUS, NAA: SARS-CoV-2, NAA: NOT DETECTED

## 2019-10-09 ENCOUNTER — Other Ambulatory Visit: Payer: Self-pay | Admitting: Family Medicine

## 2019-10-09 DIAGNOSIS — Z1231 Encounter for screening mammogram for malignant neoplasm of breast: Secondary | ICD-10-CM

## 2019-11-21 ENCOUNTER — Telehealth: Payer: Self-pay | Admitting: Hematology and Oncology

## 2019-11-21 NOTE — Telephone Encounter (Signed)
Called pt per 9/7 sch msg- left message for patient to call back to r/s

## 2019-11-23 ENCOUNTER — Other Ambulatory Visit: Payer: Self-pay | Admitting: *Deleted

## 2019-11-23 ENCOUNTER — Other Ambulatory Visit: Payer: Self-pay | Admitting: Family Medicine

## 2019-11-23 ENCOUNTER — Ambulatory Visit
Admission: RE | Admit: 2019-11-23 | Discharge: 2019-11-23 | Disposition: A | Discharge status: Home / Self Care | Payer: Commercial Managed Care - PPO | Source: Ambulatory Visit | Attending: Family Medicine | Admitting: Family Medicine

## 2019-11-23 ENCOUNTER — Other Ambulatory Visit: Payer: Self-pay

## 2019-11-23 DIAGNOSIS — Z1231 Encounter for screening mammogram for malignant neoplasm of breast: Secondary | ICD-10-CM

## 2019-11-23 MED ORDER — ANASTROZOLE 1 MG PO TABS: 1.0000 mg | ORAL_TABLET | Freq: Every day | ORAL | 3 refills | Status: DC

## 2019-11-24 ENCOUNTER — Telehealth: Payer: Self-pay | Admitting: Hematology and Oncology

## 2019-11-24 ENCOUNTER — Ambulatory Visit: Payer: Commercial Managed Care - PPO | Admitting: Hematology and Oncology

## 2019-11-24 NOTE — Telephone Encounter (Signed)
Scheduled appt per 9/10 sch msg - left message with appt date and time

## 2019-12-07 NOTE — Progress Notes (Signed)
Patient Care Team: Kelton Pillar, MD as PCP - General (Family Medicine) Nicholas Lose, MD as Consulting Physician (Hematology and Oncology) Stark Klein, MD as Consulting Physician (General Surgery) Kyung Rudd, MD as Consulting Physician (Radiation Oncology)  DIAGNOSIS:    ICD-10-CM   1. Malignant neoplasm of lower-outer quadrant of left breast of female, estrogen receptor positive ([place])  C50.512    Z17.0     SUMMARY OF ONCOLOGIC HISTORY: Oncology History  Malignant neoplasm of lower-outer quadrant of left breast of female, estrogen receptor positive ([place])  06/11/2016 Mammogram   Palpable left breast masses 3:00 position: 2.2 cm; 5:30 position: 2.5 cm; 6:30 position: 0.7 cm   06/19/2016 Initial Diagnosis   Left breast biopsy 3:30: IDC with DCIS grade 1, ER 90%, PR 50%, Ki-67 15%, HER-2 negative ratio 1.13; biopsy 5:30 position: IDC grade 1   07/13/2016 Breast MRI   Large area of abnormal enhancement lower inner and lower outer quadrants left breast spanning 9 cm x 6.4 cm x 5.3 cm, no abnormal enlarged lymph nodes; T3 N0 stage II a (New AJCC staging)    07/15/2016 - 12/08/2017 Anti-estrogen oral therapy   Neoadjuvant anastrozole 1 mg daily   07/17/2016 Oncotype testing   Testing done on the biopsy: Oncotype DX score 22, intermediate risk   02/02/2017 Breast MRI   Left breast multicentric disease unchanged measuring 2.7 x 1.6 cm.  Mass in the non-mass enhancement are also not significantly changed measuring 6.2 x 2.4 cm. new enhancing mass within the outer right breast 7 mm which could be fat necrosis or inclusion cyst    02/09/2017 Imaging   Ultrasound of the right breast lesion noted on MRI: No sonographic finding corresponds to the abnormality noted on MRI   07/13/2017 Cancer Staging   Staging form: Breast, AJCC 8th Edition - Clinical stage from 07/13/2017: Stage IIA (cT3, cN0, cM0, G1, ER+, PR+, HER2-) - Signed by Gardenia Phlegm, NP on 05/18/2018   10/26/2017 Surgery     Left mastectomy: IDC grade 1, 2 foci largest spans 8.5 cm, intermediate grade DCIS, lymphovascular invasion identified, perineural invasion identified, 1/2 lymph nodes positive with extracapsular extension, ER 9200%, PR 5 to 50%, HER-2 negative, Ki-67 10 to 15%, T3N1A Mammaprint: low risk   11/02/2017 Cancer Staging   Staging form: Breast, AJCC 8th Edition - Pathologic: No Stage Recommended (ypT3, pN1a, cM0, G1, ER+, PR+, HER2-) - Signed by Nicholas Lose, MD on 11/02/2017   12/08/2017 - 01/26/2018 Radiation Therapy   Adjuvant radiation therapy    02/2018 -  Anti-estrogen oral therapy   Anastrozole 1 mg daily adjuvant therapy     CHIEF COMPLIANT: Follow-up of left breast cancer on anastrozole  INTERVAL HISTORY: Kathryn Lucas is a 59 y.o. with above-mentioned history of left breast cancer treated with mastectomy, radiation therapy, and is currently on anti-estrogen therapy with anastrozole. Mammogram on 11/24/19 showed no evidence of malignancy in the right breast. She presents to the clinic today for annual follow-up.    ALLERGIES:  has No Known Allergies.  MEDICATIONS:  Current Outpatient Medications  Medication Sig Dispense Refill  . anastrozole (ARIMIDEX) 1 MG tablet Take 1 tablet (1 mg total) by mouth daily. 90 tablet 3  . fluticasone (FLONASE) 50 MCG/ACT nasal spray Place into the nose.    . levothyroxine (SYNTHROID, LEVOTHROID) 175 MCG tablet Take 112 mcg by mouth.      No current facility-administered medications for this visit.    PHYSICAL EXAMINATION: ECOG PERFORMANCE STATUS: 1 - Symptomatic  but completely ambulatory  Vitals:   12/08/19 0926  BP: 117/69  Pulse: 67  Resp: 18  Temp: (!) 97.5 F (36.4 C)  SpO2: 100%   Filed Weights   12/08/19 0926  Weight: 170 lb 14.4 oz (77.5 kg)    BREAST: No palpable masses or nodules in either right or left breasts. No palpable axillary supraclavicular or infraclavicular adenopathy no breast tenderness or nipple discharge.  (exam performed in the presence of a chaperone)  LABORATORY DATA:  I have reviewed the data as listed No flowsheet data found.  No results found for: WBC, HGB, HCT, MCV, PLT, NEUTROABS  ASSESSMENT & PLAN:  Malignant neoplasm of lower-outer quadrant of left breast of female, estrogen receptor positive ([place]) 06/19/2016 Left breast biopsy 3:30: IDC with DCIS grade 1, ER 90%, PR 50%, Ki-67 15%, HER-2 negative ratio 1.13; biopsy 5:30 position: IDC grade 1  10/26/17:Left mastectomy: IDC grade 1, 2 foci largest spans 8.5 cm, intermediate grade DCIS, lymphovascular invasion identified, perineural invasion identified, 1/2 lymph nodes positive with extracapsular extension, ER 9200%, PR 5 to 50%, HER-2 negative, Ki-67 10 to 15%, T3N1A  Oncotype DXscore 22, intermediate risk, chemotherapy not felt to have significant benefit.  Treatment plan: 1. Antiestrogen therapy with anastrozole 1 mg daily started 07/15/2016 2.Mastectomy8/13/2019,Mammaprint low risk luminal typeA 3. Followed by adjuvant radiation9/25/19- 01/26/18 4. Followed by adjuvant antiestrogen therapy -------------------------------------------------------------------- Current treatment: Resumption of anastrozole 1 mg daily 01/17/2018 (originally started 07/15/2016) Anastrozole toxicities: No major side effects of anastrozole therapy.   Breast cancer surveillance: 1.  Right breast mammogram 11/24/2019: Benign breast density category B 2.  Bone density 11/22/2018: T score -1.1: Osteopenia: Continue with calcium and vitamin D and weightbearing exercises. 3.  Breast exam: No palpable lumps or nodules.  She is currently has an expander and wants to get it replaced with an implant as soon as possible.  She went to see a Psychiatric nurse in [place].  I renewed the prescription for anastrozole for another year. Return to clinic in 1 year for follow-up.  No orders of the defined types were placed in this encounter.  The patient has a  good understanding of the overall plan. she agrees with it. she will call with any problems that may develop before the next visit here.  Total time spent: 20 mins including face to face time and time spent for planning, charting and coordination of care  Nicholas Lose, MD 12/08/2019  I, Cloyde Reams Dorshimer, am acting as scribe for Dr. Nicholas Lose.  I have reviewed the above documentation for accuracy and completeness, and I agree with the above.

## 2019-12-08 ENCOUNTER — Other Ambulatory Visit: Payer: Self-pay

## 2019-12-08 ENCOUNTER — Inpatient Hospital Stay: Payer: Commercial Managed Care - PPO | Attending: Hematology and Oncology | Admitting: Hematology and Oncology

## 2019-12-08 DIAGNOSIS — C50512 Malignant neoplasm of lower-outer quadrant of left female breast: Secondary | ICD-10-CM

## 2019-12-08 DIAGNOSIS — Z9012 Acquired absence of left breast and nipple: Secondary | ICD-10-CM | POA: Insufficient documentation

## 2019-12-08 DIAGNOSIS — Z79811 Long term (current) use of aromatase inhibitors: Secondary | ICD-10-CM | POA: Insufficient documentation

## 2019-12-08 DIAGNOSIS — Z170 Estrogen receptor positive status [ER+]: Secondary | ICD-10-CM | POA: Diagnosis not present

## 2019-12-08 DIAGNOSIS — M8580 Other specified disorders of bone density and structure, unspecified site: Secondary | ICD-10-CM | POA: Insufficient documentation

## 2019-12-08 MED ORDER — VITAMIN D 25 MCG (1000 UNIT) PO TABS: 1000.0000 [IU] | ORAL_TABLET | Freq: Every day | ORAL | Status: AC

## 2019-12-08 MED ORDER — VITAMIN C 250 MG PO TABS: 250.0000 mg | ORAL_TABLET | Freq: Every day | ORAL | Status: AC

## 2019-12-08 MED ORDER — ZINC GLUCONATE 50 MG PO TABS: 50.0000 mg | ORAL_TABLET | Freq: Every day | ORAL | Status: DC

## 2019-12-08 MED ORDER — CALCIUM 500-100 MG-UNIT PO CHEW: 1.0000 | CHEWABLE_TABLET | Freq: Every day | ORAL | Status: AC

## 2019-12-08 NOTE — Assessment & Plan Note (Signed)
06/19/2016 Left breast biopsy 3:30: IDC with DCIS grade 1, ER 90%, PR 50%, Ki-67 15%, HER-2 negative ratio 1.13; biopsy 5:30 position: IDC grade 1  10/26/17:Left mastectomy: IDC grade 1, 2 foci largest spans 8.5 cm, intermediate grade DCIS, lymphovascular invasion identified, perineural invasion identified, 1/2 lymph nodes positive with extracapsular extension, ER 9200%, PR 5 to 50%, HER-2 negative, Ki-67 10 to 15%, T3N1A  Oncotype DXscore 22, intermediate risk, chemotherapy not felt to have significant benefit.  Treatment plan: 1. Antiestrogen therapy with anastrozole 1 mg daily started 07/15/2016 2.Mastectomy8/13/2019,Mammaprint low risk luminal typeA 3. Followed by adjuvant radiation9/25/19- 01/26/18 4. Followed by adjuvant antiestrogen therapy -------------------------------------------------------------------- Current treatment: Resumption of anastrozole 1 mg daily 01/17/2018 (originally started 07/15/2016) Anastrozole toxicities: No major side effects of anastrozole therapy.   Breast cancer surveillance: 1.  Right breast mammogram 11/24/2019: Benign breast density category B 2.  Bone density 11/22/2018: T score -1.1: Osteopenia: Continue with calcium and vitamin D and weightbearing exercises.  I renewed the prescription for anastrozole for another year. Return to clinic in 1 year for follow-up.

## 2020-07-04 ENCOUNTER — Ambulatory Visit: Payer: 59 | Admitting: Podiatry

## 2020-07-04 ENCOUNTER — Ambulatory Visit (INDEPENDENT_AMBULATORY_CARE_PROVIDER_SITE_OTHER): Payer: Commercial Managed Care - PPO | Admitting: Podiatry

## 2020-07-04 ENCOUNTER — Other Ambulatory Visit: Payer: Self-pay

## 2020-07-04 DIAGNOSIS — L84 Corns and callosities: Secondary | ICD-10-CM

## 2020-07-05 ENCOUNTER — Encounter: Payer: Self-pay | Admitting: Podiatry

## 2020-07-05 NOTE — Progress Notes (Signed)
  Subjective:  Patient ID: Kathryn Lucas, female    DOB: 08-16-1960,  MRN: [id number]  Chief Complaint  Patient presents with  . Nail Problem    Right foot 5th toe     60 y.o. female presents with the above complaint.  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  Patient states it painful to walk on.  She states that she has not tried much in terms of conservative treatment options.  She will like to get it evaluated today.  She denies any other acute complaints.  She has not seen anyone else prior to seeing me.  She has not made any shoe gear modification either.   Review of Systems: Negative except as noted in the HPI. Denies N/V/F/Ch.  Past Medical History:  Diagnosis Date  . Cancer ([place]) 09/2017   left breast cancer  . Family history of breast cancer   . Hypothyroidism   . Personal history of radiation therapy 2019  . Thyroid disease     Current Outpatient Medications:  .  anastrozole (ARIMIDEX) 1 MG tablet, Take 1 tablet (1 mg total) by mouth daily., Disp: 90 tablet, Rfl: 3 .  Calcium 500-100 MG-UNIT CHEW, Chew 1 tablet by mouth daily., Disp: 60 tablet, Rfl:  .  cholecalciferol (VITAMIN D3) 25 MCG (1000 UNIT) tablet, Take 1 tablet (1,000 Units total) by mouth daily., Disp: , Rfl:  .  levothyroxine (SYNTHROID, LEVOTHROID) 175 MCG tablet, Take 112 mcg by mouth. , Disp: , Rfl:  .  vitamin C (ASCORBIC ACID) 250 MG tablet, Take 1 tablet (250 mg total) by mouth daily., Disp: , Rfl:  .  zinc gluconate 50 MG tablet, Take 1 tablet (50 mg total) by mouth daily., Disp: , Rfl:   Social History   Tobacco Use  Smoking Status Never Smoker  Smokeless Tobacco Never Used    No Known Allergies Objective:  There were no vitals filed for this visit. There is no height or weight on file to calculate BMI. Constitutional Well developed. Well nourished.  Vascular Dorsalis pedis pulses palpable bilaterally. Posterior tibial pulses  palpable bilaterally. Capillary refill normal to all digits.  No cyanosis or clubbing noted. Pedal hair growth normal.  Neurologic Normal speech. Oriented to person, place, and time. Epicritic sensation to light touch grossly present bilaterally.  Dermatologic  hyperkeratotic lesion noted to the right interdigital space between fourth and fifth digit.  Consistent with heloma molle.  No pinpoint bleeding noted.  Hammertoe contracture of the fifth digit noted with adductovarus rotation.  Semiflexible in nature  Orthopedic: Normal joint ROM without pain or crepitus bilaterally. No visible deformities. No bony tenderness.   Radiographs: None Assessment:   1. Heloma molle    Plan:  Patient was evaluated and treated and all questions answered.  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  Given the amount of pain she is having I believe she would benefit from toe protector spacers as well as shoe gear modification I discussed all of this in extensive detail.  If there is no improvement we will discuss surgical options at that time.  She states understanding.  No follow-ups on file.

## 2020-08-07 ENCOUNTER — Ambulatory Visit (INDEPENDENT_AMBULATORY_CARE_PROVIDER_SITE_OTHER): Payer: Commercial Managed Care - PPO | Admitting: Podiatry

## 2020-08-07 ENCOUNTER — Other Ambulatory Visit: Payer: Self-pay

## 2020-08-07 DIAGNOSIS — L84 Corns and callosities: Secondary | ICD-10-CM | POA: Diagnosis not present

## 2020-08-09 ENCOUNTER — Encounter: Payer: Self-pay | Admitting: Podiatry

## 2020-08-09 NOTE — Progress Notes (Signed)
  Subjective:  Patient ID: Kathryn Lucas, female    DOB: 05/17/1960,  MRN: [id number]  Chief Complaint  Patient presents with  . right foot     Right foot 5th toe     60 y.o. female presents with the above complaint.  Patient presents for follow-up of right fifth digit heloma molle.  Patient states that she is doing a lot better is healing well.  The spacers have helped.  She is not still ready for surgery yet.  She denies any other acute complaints.   Review of Systems: Negative except as noted in the HPI. Denies N/V/F/Ch.  Past Medical History:  Diagnosis Date  . Cancer ([place]) 09/2017   left breast cancer  . Family history of breast cancer   . Hypothyroidism   . Personal history of radiation therapy 2019  . Thyroid disease     Current Outpatient Medications:  .  anastrozole (ARIMIDEX) 1 MG tablet, Take 1 tablet (1 mg total) by mouth daily., Disp: 90 tablet, Rfl: 3 .  Calcium 500-100 MG-UNIT CHEW, Chew 1 tablet by mouth daily., Disp: 60 tablet, Rfl:  .  cholecalciferol (VITAMIN D3) 25 MCG (1000 UNIT) tablet, Take 1 tablet (1,000 Units total) by mouth daily., Disp: , Rfl:  .  levothyroxine (SYNTHROID, LEVOTHROID) 175 MCG tablet, Take 112 mcg by mouth. , Disp: , Rfl:  .  vitamin C (ASCORBIC ACID) 250 MG tablet, Take 1 tablet (250 mg total) by mouth daily., Disp: , Rfl:  .  zinc gluconate 50 MG tablet, Take 1 tablet (50 mg total) by mouth daily., Disp: , Rfl:   Social History   Tobacco Use  Smoking Status Never Smoker  Smokeless Tobacco Never Used    No Known Allergies Objective:  There were no vitals filed for this visit. There is no height or weight on file to calculate BMI. Constitutional Well developed. Well nourished.  Vascular Dorsalis pedis pulses palpable bilaterally. Posterior tibial pulses palpable bilaterally. Capillary refill normal to all digits.  No cyanosis or clubbing noted. Pedal hair growth normal.  Neurologic Normal speech. Oriented to person, place,  and time. Epicritic sensation to light touch grossly present bilaterally.  Dermatologic  hyperkeratotic lesion noted to the right interdigital space between fourth and fifth digit.  Consistent with heloma molle.  No pinpoint bleeding noted.  Hammertoe contracture of the fifth digit noted with adductovarus rotation.  Semiflexible in nature  Orthopedic: Normal joint ROM without pain or crepitus bilaterally. No visible deformities. No bony tenderness.   Radiographs: None Assessment:   1. Heloma molle    Plan:  Patient was evaluated and treated and all questions answered.  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  Given the amount of pain she is having I believe she would benefit from toe protector spacers as well as shoe gear modification I discussed all of this in extensive detail.  I briefly discussed surgery call options with her.  However she is not ready for surgery yet.  It is clinically improving.  I discussed and dispensed more toe protectors as well. No follow-ups on file.

## 2020-09-25 ENCOUNTER — Ambulatory Visit: Payer: Commercial Managed Care - PPO | Admitting: Podiatry

## 2020-10-09 ENCOUNTER — Ambulatory Visit (INDEPENDENT_AMBULATORY_CARE_PROVIDER_SITE_OTHER): Payer: Commercial Managed Care - PPO | Admitting: Podiatry

## 2020-10-09 ENCOUNTER — Encounter: Payer: Self-pay | Admitting: Podiatry

## 2020-10-09 ENCOUNTER — Other Ambulatory Visit: Payer: Self-pay

## 2020-10-09 DIAGNOSIS — Q828 Other specified congenital malformations of skin: Secondary | ICD-10-CM | POA: Diagnosis not present

## 2020-10-09 NOTE — Progress Notes (Signed)
  Subjective:  Patient ID: Kathryn Lucas, female    DOB: 1961-01-04,  MRN: [id number]  Chief Complaint  Patient presents with   Callouses    Left foot 5th toe     60 y.o. female presents with the above complaint.  Patient presents with new complaint left submetatarsal 5 porokeratosis.  Patient states painful to touch painful to walk on.  She would like to get evaluated.  She states her right foot is doing much better now.  She denies any other acute complaints she has not seen MRIs prior to see me for the left side.  She has not tried anything over-the-counter.  She went to get it looked at right away.   Review of Systems: Negative except as noted in the HPI. Denies N/V/F/Ch.  Past Medical History:  Diagnosis Date   Cancer ([place]) 09/2017   left breast cancer   Family history of breast cancer    Hypothyroidism    Personal history of radiation therapy 2019   Thyroid disease     Current Outpatient Medications:    anastrozole (ARIMIDEX) 1 MG tablet, Take 1 tablet (1 mg total) by mouth daily., Disp: 90 tablet, Rfl: 3   Calcium 500-100 MG-UNIT CHEW, Chew 1 tablet by mouth daily., Disp: 60 tablet, Rfl:    cholecalciferol (VITAMIN D3) 25 MCG (1000 UNIT) tablet, Take 1 tablet (1,000 Units total) by mouth daily., Disp: , Rfl:    levothyroxine (SYNTHROID, LEVOTHROID) 175 MCG tablet, Take 112 mcg by mouth. , Disp: , Rfl:    vitamin C (ASCORBIC ACID) 250 MG tablet, Take 1 tablet (250 mg total) by mouth daily., Disp: , Rfl:    zinc gluconate 50 MG tablet, Take 1 tablet (50 mg total) by mouth daily., Disp: , Rfl:   Social History   Tobacco Use  Smoking Status Never  Smokeless Tobacco Never    No Known Allergies Objective:  There were no vitals filed for this visit. There is no height or weight on file to calculate BMI. Constitutional Well developed. Well nourished.  Vascular Dorsalis pedis pulses palpable bilaterally. Posterior tibial pulses palpable bilaterally. Capillary refill  normal to all digits.  No cyanosis or clubbing noted. Pedal hair growth normal.  Neurologic Normal speech. Oriented to person, place, and time. Epicritic sensation to light touch grossly present bilaterally.  Dermatologic Hyperkeratotic lesion with central nucleated core noted.  Pain on palpation to the lesion.  No pinpoint bleeding noted.  No ulcers noted.  Left submetatarsal 5  Orthopedic: Normal joint ROM without pain or crepitus bilaterally. No visible deformities. No bony tenderness.   Radiographs: None Assessment:   1. Porokeratosis    Plan:  Patient was evaluated and treated and all questions answered.  Left submetatarsal 5 porokeratosis -I explained to the patient the etiology of porokeratosis and various treatment options were discussed.  Given the amount of pain that she is having I believe she will benefit from aggressive debridement of the lesion followed by excision of the central nucleated core.  Patient agrees with the plan.  Using chisel blade to handle the lesion was debrided down to healthy striated tissue followed by excision of central nucleated core.  Patient had immediate relief.  No pinpoint bleeding noted. -Offloading pads were dispensed  No follow-ups on file.

## 2020-10-21 ENCOUNTER — Other Ambulatory Visit: Payer: Self-pay | Admitting: Family Medicine

## 2020-10-21 DIAGNOSIS — Z1231 Encounter for screening mammogram for malignant neoplasm of breast: Secondary | ICD-10-CM

## 2020-11-11 ENCOUNTER — Other Ambulatory Visit: Payer: Self-pay | Admitting: Hematology and Oncology

## 2020-12-06 ENCOUNTER — Ambulatory Visit: Payer: Commercial Managed Care - PPO | Admitting: Hematology and Oncology

## 2020-12-10 ENCOUNTER — Other Ambulatory Visit: Payer: Self-pay

## 2020-12-10 ENCOUNTER — Other Ambulatory Visit: Payer: Self-pay | Admitting: Family Medicine

## 2020-12-10 ENCOUNTER — Ambulatory Visit
Admission: RE | Admit: 2020-12-10 | Discharge: 2020-12-10 | Disposition: A | Discharge status: Home / Self Care | Payer: Commercial Managed Care - PPO | Source: Ambulatory Visit | Attending: Family Medicine | Admitting: Family Medicine

## 2020-12-10 DIAGNOSIS — Z1231 Encounter for screening mammogram for malignant neoplasm of breast: Secondary | ICD-10-CM

## 2020-12-10 HISTORY — DX: Malignant neoplasm of unspecified site of unspecified female breast: C50.919

## 2020-12-16 ENCOUNTER — Other Ambulatory Visit: Payer: Self-pay | Admitting: Family Medicine

## 2020-12-16 DIAGNOSIS — R928 Other abnormal and inconclusive findings on diagnostic imaging of breast: Secondary | ICD-10-CM

## 2020-12-16 NOTE — Progress Notes (Signed)
Patient Care Team: Kathryn Pillar, MD as PCP - General (Family Medicine) Kathryn Lose, MD as Consulting Physician (Hematology and Oncology) Kathryn Klein, MD as Consulting Physician (General Surgery) Kathryn Rudd, MD as Consulting Physician (Radiation Oncology)  DIAGNOSIS:    ICD-10-CM   1. Malignant neoplasm of lower-outer quadrant of left breast of female, estrogen receptor positive ([place])  C50.512    Z17.0       SUMMARY OF ONCOLOGIC HISTORY: Oncology History  Malignant neoplasm of lower-outer quadrant of left breast of female, estrogen receptor positive ([place])  06/11/2016 Mammogram   Palpable left breast masses 3:00 position: 2.2 cm; 5:30 position: 2.5 cm; 6:30 position: 0.7 cm   06/19/2016 Initial Diagnosis   Left breast biopsy 3:30: IDC with DCIS grade 1, ER 90%, PR 50%, Ki-67 15%, HER-2 negative ratio 1.13; biopsy 5:30 position: IDC grade 1   07/13/2016 Breast MRI   Large area of abnormal enhancement lower inner and lower outer quadrants left breast spanning 9 cm x 6.4 cm x 5.3 cm, no abnormal enlarged lymph nodes; T3 N0 stage II a (New AJCC staging)    07/15/2016 - 12/08/2017 Anti-estrogen oral therapy   Neoadjuvant anastrozole 1 mg daily   07/17/2016 Oncotype testing   Testing done on the biopsy: Oncotype DX score 22, intermediate risk   02/02/2017 Breast MRI   Left breast multicentric disease unchanged measuring 2.7 x 1.6 cm.  Mass in the non-mass enhancement are also not significantly changed measuring 6.2 x 2.4 cm. new enhancing mass within the outer right breast 7 mm which could be fat necrosis or inclusion cyst    02/09/2017 Imaging   Ultrasound of the right breast lesion noted on MRI: No sonographic finding corresponds to the abnormality noted on MRI   07/13/2017 Cancer Staging   Staging form: Breast, AJCC 8th Edition - Clinical stage from 07/13/2017: Stage IIA (cT3, cN0, cM0, G1, ER+, PR+, HER2-) - Signed by Gardenia Phlegm, NP on 05/18/2018   10/26/2017  Surgery   Left mastectomy: IDC grade 1, 2 foci largest spans 8.5 cm, intermediate grade DCIS, lymphovascular invasion identified, perineural invasion identified, 1/2 lymph nodes positive with extracapsular extension, ER 9200%, PR 5 to 50%, HER-2 negative, Ki-67 10 to 15%, T3N1A Mammaprint: low risk   11/02/2017 Cancer Staging   Staging form: Breast, AJCC 8th Edition - Pathologic: No Stage Recommended (ypT3, pN1a, cM0, G1, ER+, PR+, HER2-) - Signed by Kathryn Lose, MD on 11/02/2017   12/08/2017 - 01/26/2018 Radiation Therapy   Adjuvant radiation therapy    02/2018 -  Anti-estrogen oral therapy   Anastrozole 1 mg daily adjuvant therapy     CHIEF COMPLIANT: Follow-up of left breast cancer on anastrozole  INTERVAL HISTORY: Kathryn Lucas is a 60 y.o. with above-mentioned history of left breast cancer treated with mastectomy, radiation therapy, and is currently on anti-estrogen therapy with anastrozole. Mammogram on 12/10/2020 showed possible asymmetry in the right breast. She presents to the clinic today for annual follow-up.  She has an additional mammogram and ultrasound set up in the next week.  She does not report any pain or discomfort.  She denies any side effects to anastrozole therapy.  ALLERGIES:  has No Known Allergies.  MEDICATIONS:  Current Outpatient Medications  Medication Sig Dispense Refill   anastrozole (ARIMIDEX) 1 MG tablet TAKE ONE TABLET BY MOUTH EVERY DAY 90 tablet 0   Calcium 500-100 MG-UNIT CHEW Chew 1 tablet by mouth daily. 60 tablet    cholecalciferol (VITAMIN D3) 25 MCG (1000  UNIT) tablet Take 1 tablet (1,000 Units total) by mouth daily.     levothyroxine (SYNTHROID, LEVOTHROID) 175 MCG tablet Take 112 mcg by mouth.      vitamin C (ASCORBIC ACID) 250 MG tablet Take 1 tablet (250 mg total) by mouth daily.     zinc gluconate 50 MG tablet Take 1 tablet (50 mg total) by mouth daily.     No current facility-administered medications for this visit.    PHYSICAL  EXAMINATION: ECOG PERFORMANCE STATUS: 1 - Symptomatic but completely ambulatory  Vitals:   12/17/20 0922  BP: 131/78  Pulse: 80  Resp: 18  Temp: 97.7 F (36.5 C)  SpO2: 100%   Filed Weights   12/17/20 0922  Weight: 174 lb 11.2 oz (79.2 kg)    BREAST: No palpable masses or nodules in either right or left breasts. No palpable axillary supraclavicular or infraclavicular adenopathy no breast tenderness or nipple discharge. (exam performed in the presence of a chaperone)  LABORATORY DATA:  I have reviewed the data as listed No flowsheet data found.  No results found for: WBC, HGB, HCT, MCV, PLT, NEUTROABS  ASSESSMENT & PLAN:  Malignant neoplasm of lower-outer quadrant of left breast of female, estrogen receptor positive ([place]) 06/19/2016 Left breast biopsy 3:30: IDC with DCIS grade 1, ER 90%, PR 50%, Ki-67 15%, HER-2 negative ratio 1.13; biopsy 5:30 position: IDC grade 1   10/26/17: Left mastectomy: IDC grade 1, 2 foci largest spans 8.5 cm, intermediate grade DCIS, lymphovascular invasion identified, perineural invasion identified, 1/2 lymph nodes positive with extracapsular extension, ER 9200%, PR 5 to 50%, HER-2 negative, Ki-67 10 to 15%, T3N1A   Oncotype DX score 22, intermediate risk, chemotherapy not felt to have significant benefit.   Treatment plan: 1. Antiestrogen therapy with anastrozole 1 mg daily started 07/15/2016 2. Mastectomy 10/26/2017, Mammaprint low risk luminal type A 3. Followed by adjuvant radiation 12/08/17- 01/26/18  4. Followed by adjuvant antiestrogen therapy -------------------------------------------------------------------- Current treatment: Resumption of anastrozole 1 mg daily 01/17/2018 (originally started 07/15/2016) Anastrozole toxicities: No major side effects of anastrozole therapy.   Breast cancer surveillance: 1.  Right breast mammogram  12/13/2020: Asymmetry in the left breast warranting further evaluation.  Additional mammogram and ultrasound have  been scheduled. 2.  Bone density 11/22/2018: T score -1.1: Osteopenia: Continue with calcium and vitamin D and weightbearing exercises. 3.  Breast exam: 12/17/2020 no palpable lumps or nodules.  She is currently has an expander and wants to get it replaced with an implant as soon as possible.  She might get it done with a Psychiatric nurse in [place].  Awaiting the results of the additional mammogram and ultrasound. I renewed the prescription for anastrozole for another year. Return to clinic in 1 year for follow-up.    No orders of the defined types were placed in this encounter.  The patient has a good understanding of the overall plan. she agrees with it. she will call with any problems that may develop before the next visit here.  Total time spent: 20 mins including face to face time and time spent for planning, charting and coordination of care  Rulon Eisenmenger, MD, MPH 12/17/2020  I, Thana Ates, am acting as scribe for Dr. Nicholas Lucas.  I have reviewed the above documentation for accuracy and completeness, and I agree with the above.

## 2020-12-17 ENCOUNTER — Inpatient Hospital Stay: Payer: Commercial Managed Care - PPO | Attending: Hematology and Oncology | Admitting: Hematology and Oncology

## 2020-12-17 ENCOUNTER — Other Ambulatory Visit: Payer: Self-pay

## 2020-12-17 DIAGNOSIS — Z170 Estrogen receptor positive status [ER+]: Secondary | ICD-10-CM | POA: Diagnosis not present

## 2020-12-17 DIAGNOSIS — Z9012 Acquired absence of left breast and nipple: Secondary | ICD-10-CM | POA: Diagnosis not present

## 2020-12-17 DIAGNOSIS — Z79811 Long term (current) use of aromatase inhibitors: Secondary | ICD-10-CM | POA: Diagnosis not present

## 2020-12-17 DIAGNOSIS — C50512 Malignant neoplasm of lower-outer quadrant of left female breast: Secondary | ICD-10-CM | POA: Diagnosis not present

## 2020-12-17 MED ORDER — ANASTROZOLE 1 MG PO TABS: 1.0000 mg | ORAL_TABLET | Freq: Every day | ORAL | 3 refills | Status: DC

## 2020-12-17 NOTE — Assessment & Plan Note (Signed)
06/19/2016 Left breast biopsy 3:30: IDC with DCIS grade 1, ER 90%, PR 50%, Ki-67 15%, HER-2 negative ratio 1.13; biopsy 5:30 position: IDC grade 1  10/26/17:Left mastectomy: IDC grade 1, 2 foci largest spans 8.5 cm, intermediate grade DCIS, lymphovascular invasion identified, perineural invasion identified, 1/2 lymph nodes positive with extracapsular extension, ER 9200%, PR 5 to 50%, HER-2 negative, Ki-67 10 to 15%, T3N1A  Oncotype DXscore 22, intermediate risk, chemotherapy not felt to have significant benefit.  Treatment plan: 1. Antiestrogen therapy with anastrozole 1 mg daily started 07/15/2016 2.Mastectomy8/13/2019,Mammaprint low risk luminal typeA 3. Followed by adjuvant radiation9/25/19- 01/26/18 4. Followed by adjuvant antiestrogen therapy -------------------------------------------------------------------- Current treatment: Resumption of anastrozole 1 mg daily11/06/2017 (originally started 07/15/2016) Anastrozole toxicities: No major side effects of anastrozole therapy.  Breast cancer surveillance: 1.Right breast mammogram  12/13/2020: Benign breast density category B 2.Bone density 11/22/2018: T score -1.1: Osteopenia: Continue with calcium and vitamin D and weightbearing exercises. 3.  Breast exam: 12/17/2020 no palpable lumps or nodules.  She is currently has an expander and wants to get it replaced with an implant as soon as possible.  She went to see a Psychiatric nurse in [place].  I renewed the prescription for anastrozole for another year. Return to clinic in 1 year for follow-up.

## 2021-01-03 ENCOUNTER — Ambulatory Visit
Admission: RE | Admit: 2021-01-03 | Discharge: 2021-01-03 | Disposition: A | Discharge status: Home / Self Care | Payer: Commercial Managed Care - PPO | Source: Ambulatory Visit | Attending: Family Medicine | Admitting: Family Medicine

## 2021-01-03 ENCOUNTER — Other Ambulatory Visit: Payer: Self-pay

## 2021-01-03 DIAGNOSIS — R928 Other abnormal and inconclusive findings on diagnostic imaging of breast: Secondary | ICD-10-CM

## 2021-01-13 ENCOUNTER — Ambulatory Visit (INDEPENDENT_AMBULATORY_CARE_PROVIDER_SITE_OTHER): Payer: Commercial Managed Care - PPO | Admitting: Sports Medicine

## 2021-01-13 ENCOUNTER — Other Ambulatory Visit: Payer: Self-pay

## 2021-01-13 VITALS — BP 102/70 | HR 73 | Ht 71.0 in | Wt 175.0 lb

## 2021-01-13 DIAGNOSIS — S76011A Strain of muscle, fascia and tendon of right hip, initial encounter: Secondary | ICD-10-CM | POA: Diagnosis not present

## 2021-01-13 MED ORDER — MELOXICAM 15 MG PO TABS: 15.0000 mg | ORAL_TABLET | Freq: Every day | ORAL | 0 refills | Status: DC

## 2021-01-13 NOTE — Progress Notes (Signed)
    Kathryn Lucas D.[name] Phone: [phone]   Assessment and Plan:     1. Strain of flexor muscle of right hip, initial encounter 2. Muscle strain of right gluteal region, initial encounter -Acute, uncomplicated, initial sports medicine visit - Likely strains of hip flexors and resulting strain of gluteus medius/minimus due to compensation with interval improvement - Continue activity modification - Start meloxicam 15 mg daily x2 weeks and then may use remainder as needed for pain control - Start HEP for core and hip strengthening   Pertinent previous records reviewed include none   Follow Up: As needed if no improvement or worsening of symptoms in 4 weeks.  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  Chief Complaint: Hip flexor pain  HPI:   01/13/21 Right hip pain. Goes to [place]. Irritated hip flexor. Lateral knee pain at some point. Irritated right glute. Tight, dull, consistent pain. No numbness and tingling.  Relevant Historical Information: History of breast cancer  Additional pertinent review of systems negative.   Current Outpatient Medications:    anastrozole (ARIMIDEX) 1 MG tablet, Take 1 tablet (1 mg total) by mouth daily., Disp: 90 tablet, Rfl: 3   Calcium 500-100 MG-UNIT CHEW, Chew 1 tablet by mouth daily., Disp: 60 tablet, Rfl:    cholecalciferol (VITAMIN D3) 25 MCG (1000 UNIT) tablet, Take 1 tablet (1,000 Units total) by mouth daily., Disp: , Rfl:    levothyroxine (SYNTHROID, LEVOTHROID) 175 MCG tablet, Take 112 mcg by mouth. , Disp: , Rfl:    meloxicam (MOBIC) 15 MG tablet, Take 1 tablet (15 mg total) by mouth daily., Disp: 30 tablet, Rfl: 0   vitamin C (ASCORBIC ACID) 250 MG tablet, Take 1 tablet (250 mg total) by mouth daily., Disp: , Rfl:    zinc gluconate 50 MG tablet, Take 1  tablet (50 mg total) by mouth daily., Disp: , Rfl:    Objective:     Vitals:   01/13/21 0907  BP: 102/70  Pulse: 73  SpO2: 98%  Weight: 175 lb (79.4 kg)  Height: 5\' 11"  (1.803 m)      Body mass index is 24.41 kg/m.    Physical Exam:    General: awake, alert, and oriented no acute distress, nontoxic Skin: no suspicious lesions or rashes Neuro:sensation intact distally with no dificits, normal muscle tone, no atrophy, strength 5/5 in all tested lower ext groups Psych: normal mood and affect, speech clear  Right hip: No deformity, swelling or wasting ROM Fexion 90, ext 30, IR 45, ER 45 Mild TTP to gluteal musculature and greater troch NTTP over the hip flexors, si joint, lumbar spine Negative log roll with FROM Negative FABER Negative FADIR Negative Piriformis test Negative trendelenberg Gait normal    Electronically signed by:  Kathryn Lucas D.[facility] 9:29 AM 01/13/21

## 2021-01-13 NOTE — Patient Instructions (Signed)
Meloxicam 15mg  daily for 2 weeks, use the rest as needed Do prescribed exercises at least 3x a week Follow up as needed in 1 month if no improvement

## 2021-02-11 ENCOUNTER — Telehealth: Payer: Self-pay | Admitting: Sports Medicine

## 2021-02-11 NOTE — Telephone Encounter (Signed)
Patient called requesting a refill on meloxicam (MOBIC) 15 MG tablet to be sent to [facility] on [address].

## 2021-02-11 NOTE — Telephone Encounter (Signed)
Left message for patient to call back to schedule.  °

## 2021-04-19 ENCOUNTER — Emergency Department (HOSPITAL_COMMUNITY)
Admission: EM | Admit: 2021-04-19 | Discharge: 2021-04-19 | Disposition: A | Discharge status: Home / Self Care | Payer: Commercial Managed Care - PPO | Attending: Emergency Medicine | Admitting: Emergency Medicine

## 2021-04-19 ENCOUNTER — Other Ambulatory Visit: Payer: Self-pay

## 2021-04-19 ENCOUNTER — Encounter (HOSPITAL_COMMUNITY): Payer: Self-pay

## 2021-04-19 DIAGNOSIS — R002 Palpitations: Secondary | ICD-10-CM | POA: Diagnosis not present

## 2021-04-19 DIAGNOSIS — R55 Syncope and collapse: Secondary | ICD-10-CM | POA: Diagnosis present

## 2021-04-19 LAB — CBC
HCT: 43.7 % (ref 36.0–46.0)
Hemoglobin: 14.6 g/dL (ref 12.0–15.0)
MCH: 31 pg (ref 26.0–34.0)
MCHC: 33.4 g/dL (ref 30.0–36.0)
MCV: 92.8 fL (ref 80.0–100.0)
Platelets: 192 10*3/uL (ref 150–400)
RBC: 4.71 MIL/uL (ref 3.87–5.11)
RDW: 12.6 % (ref 11.5–15.5)
WBC: 5.9 10*3/uL (ref 4.0–10.5)
nRBC: 0 % (ref 0.0–0.2)

## 2021-04-19 LAB — BASIC METABOLIC PANEL
Anion gap: 6 (ref 5–15)
BUN: 24 mg/dL — ABNORMAL HIGH (ref 6–20)
CO2: 27 mmol/L (ref 22–32)
Calcium: 9.1 mg/dL (ref 8.9–10.3)
Chloride: 101 mmol/L (ref 98–111)
Creatinine, Ser: 0.93 mg/dL (ref 0.44–1.00)
GFR, Estimated: >60 mL/min (ref >60)
Glucose, Bld: 128 mg/dL — ABNORMAL HIGH (ref 70–99)
Potassium: 3.6 mmol/L (ref 3.5–5.1)
Sodium: 134 mmol/L — ABNORMAL LOW (ref 135–145)

## 2021-04-19 LAB — URINALYSIS, ROUTINE W REFLEX MICROSCOPIC
Bacteria, UA: NONE SEEN
Bilirubin Urine: NEGATIVE
Glucose, UA: NEGATIVE mg/dL
Ketones, ur: NEGATIVE mg/dL
Leukocytes,Ua: NEGATIVE
Nitrite: NEGATIVE
Protein, ur: NEGATIVE mg/dL
Specific Gravity, Urine: <1.005 — ABNORMAL LOW (ref 1.005–1.030)
pH: 6 (ref 5.0–8.0)

## 2021-04-19 LAB — TSH: TSH: 0.386 u[IU]/mL (ref 0.350–4.500)

## 2021-04-19 LAB — CBG MONITORING, ED: Glucose-Capillary: 134 mg/dL — ABNORMAL HIGH (ref 70–99)

## 2021-04-19 NOTE — ED Triage Notes (Signed)
Pt reports feeling her heart racing all day and reports feeling like she is going to pass out at times. Pt reports having this happen before and she was dehydrated.

## 2021-04-19 NOTE — Discharge Instructions (Signed)
Continue your usual at home treatments.  Make sure you are getting plenty of rest and drink a lot of fluids.  Follow-up with your doctor for persistent or concerning symptoms.

## 2021-04-19 NOTE — ED Provider Notes (Signed)
[facility] DEPT Provider Note   CSN: [id number] Arrival date & time: 04/19/21  1758     History  Chief Complaint  Patient presents with   Palpitations   Weakness    Kathryn Lucas is a 61 y.o. female.  HPI Patient presents for evaluation of sensation of racing heart beat and sensation of near syncope.  She thinks that she may be dehydration to cause this.  She denies fever, chills, nausea, vomiting, diarrhea or dysuria.  She wonders if she might of had a panic attack.    Home Medications Prior to Admission medications   Medication Sig Start Date End Date Taking? Authorizing Provider  anastrozole (ARIMIDEX) 1 MG tablet Take 1 tablet (1 mg total) by mouth daily. 12/17/20   Nicholas Lose, MD  Calcium 500-100 MG-UNIT CHEW Chew 1 tablet by mouth daily. 12/08/19   Nicholas Lose, MD  cholecalciferol (VITAMIN D3) 25 MCG (1000 UNIT) tablet Take 1 tablet (1,000 Units total) by mouth daily. 12/08/19   Nicholas Lose, MD  levothyroxine (SYNTHROID, LEVOTHROID) 175 MCG tablet Take 112 mcg by mouth.     [provider]  meloxicam (MOBIC) 15 MG tablet Take 1 tablet (15 mg total) by mouth daily. 01/13/21   Glennon Mac, DO  vitamin C (ASCORBIC ACID) 250 MG tablet Take 1 tablet (250 mg total) by mouth daily. 12/08/19   Nicholas Lose, MD  zinc gluconate 50 MG tablet Take 1 tablet (50 mg total) by mouth daily. 12/08/19   Nicholas Lose, MD      Allergies    Patient has no known allergies.    Review of Systems   Review of Systems  Physical Exam Updated Vital Signs BP 122/68 (BP Location: Right Arm)    Pulse 79    Temp 98.2 F (36.8 C) (Oral)    Resp 16    Ht 6' (1.829 m)    Wt 79 kg    SpO2 99%    BMI 23.63 kg/m  Physical Exam Vitals and nursing note reviewed.  Constitutional:      General: She is not in acute distress.    Appearance: She is well-developed. She is not ill-appearing, toxic-appearing or diaphoretic.  HENT:     Head: Normocephalic and  atraumatic.     Right Ear: External ear normal.     Left Ear: External ear normal.  Eyes:     Conjunctiva/sclera: Conjunctivae normal.     Pupils: Pupils are equal, round, and reactive to light.  Neck:     Trachea: Phonation normal.  Cardiovascular:     Rate and Rhythm: Normal rate and regular rhythm.     Heart sounds: Normal heart sounds.  Pulmonary:     Effort: Pulmonary effort is normal.     Breath sounds: Normal breath sounds. No stridor.  Abdominal:     General: There is no distension.  Musculoskeletal:        General: Normal range of motion.     Cervical back: Normal range of motion and neck supple.  Skin:    General: Skin is warm and dry.  Neurological:     Mental Status: She is alert and oriented to person, place, and time.     Cranial Nerves: No cranial nerve deficit.     Sensory: No sensory deficit.     Motor: No abnormal muscle tone.     Coordination: Coordination normal.  Psychiatric:        Mood and Affect: Mood normal.  Behavior: Behavior normal.        Thought Content: Thought content normal.        Judgment: Judgment normal.    ED Results / Procedures / Treatments   Labs (all labs ordered are listed, but only abnormal results are displayed) Labs Reviewed  BASIC METABOLIC PANEL - Abnormal; Notable for the following components:      Result Value   Sodium 134 (*)    Glucose, Bld 128 (*)    BUN 24 (*)    All other components within normal limits  URINALYSIS, ROUTINE W REFLEX MICROSCOPIC - Abnormal; Notable for the following components:   Color, Urine YELLOW (*)    APPearance CLEAR (*)    Specific Gravity, Urine <1.005 (*)    Hgb urine dipstick SMALL (*)    All other components within normal limits  CBG MONITORING, ED - Abnormal; Notable for the following components:   Glucose-Capillary 134 (*)    All other components within normal limits  CBC  TSH    EKG EKG Interpretation  Date/Time:  Saturday April 19 2021 18:08:45 EST Ventricular  Rate:  105 PR Interval:  153 QRS Duration: 78 QT Interval:  329 QTC Calculation: 435 R Axis:   79 Text Interpretation: Sinus tachycardia No previous ECGs available Confirmed by Daleen Bo [phone]) on 04/19/2021 7:04:51 PM  Radiology No results found.  Procedures Procedures    Medications Ordered in ED Medications - No data to display  ED Course/ Medical Decision Making/ A&P                           Medical Decision Making Patient presenting with near syncope and palpitations, nonspecific symptoms without other acute complaints.  Problems Addressed: Near syncope: acute illness or injury    Details: Onset while working today after having palpitations Palpitations: acute illness or injury    Details: Occurred today spontaneously while working.  Amount and/or Complexity of Data Reviewed External Data Reviewed: notes.    Details: Prior history of breast cancer status post surgical removal/mastectomy and chemotherapy. Labs: ordered.    Details: CBC, metabolic panel, urinalysis, TSH-normal except mild elevation of BUN and glucose. ECG/medicine tests: ordered and independent interpretation performed.    Details: Normal sinus rhythm.  EKG without ischemia or infarct  Risk Decision regarding hospitalization. Risk Details: Patient with transient symptoms associated with sensation of rapid heartbeat.  Nonspecific symptoms, differential diagnosis includes panic attack.  Low suspicion for cardiac disease.  Doubt complications from prior breast surgery/breast cancer.  Unlikely to represent PE, lack of ongoing persistent symptoms.  Patient does not require further ED evaluation or hospitalization at this time.           Final Clinical Impression(s) / ED Diagnoses Final diagnoses:  Palpitations  Near syncope    Rx / DC Orders ED Discharge Orders     None         Daleen Bo, MD 04/19/21 2308

## 2021-08-07 ENCOUNTER — Other Ambulatory Visit: Payer: Self-pay

## 2021-08-07 ENCOUNTER — Emergency Department
Admission: EM | Admit: 2021-08-07 | Discharge: 2021-08-07 | Disposition: A | Discharge status: Home / Self Care | Payer: Commercial Managed Care - PPO | Attending: Emergency Medicine | Admitting: Emergency Medicine

## 2021-08-07 ENCOUNTER — Emergency Department: Payer: Commercial Managed Care - PPO

## 2021-08-07 DIAGNOSIS — R55 Syncope and collapse: Secondary | ICD-10-CM | POA: Diagnosis present

## 2021-08-07 DIAGNOSIS — Z853 Personal history of malignant neoplasm of breast: Secondary | ICD-10-CM | POA: Insufficient documentation

## 2021-08-07 DIAGNOSIS — E039 Hypothyroidism, unspecified: Secondary | ICD-10-CM | POA: Insufficient documentation

## 2021-08-07 LAB — URINALYSIS, ROUTINE W REFLEX MICROSCOPIC
Bilirubin Urine: NEGATIVE
Glucose, UA: NEGATIVE mg/dL
Ketones, ur: NEGATIVE mg/dL
Nitrite: NEGATIVE
Protein, ur: NEGATIVE mg/dL
Specific Gravity, Urine: 1.002 — ABNORMAL LOW (ref 1.005–1.030)
pH: 6 (ref 5.0–8.0)

## 2021-08-07 LAB — BASIC METABOLIC PANEL
Anion gap: 6 (ref 5–15)
BUN: 18 mg/dL (ref 6–20)
CO2: 27 mmol/L (ref 22–32)
Calcium: 9 mg/dL (ref 8.9–10.3)
Chloride: 102 mmol/L (ref 98–111)
Creatinine, Ser: 0.91 mg/dL (ref 0.44–1.00)
GFR, Estimated: >60 mL/min (ref >60)
Glucose, Bld: 92 mg/dL (ref 70–99)
Potassium: 4.2 mmol/L (ref 3.5–5.1)
Sodium: 135 mmol/L (ref 135–145)

## 2021-08-07 LAB — CBC
HCT: 43.5 % (ref 36.0–46.0)
Hemoglobin: 14.1 g/dL (ref 12.0–15.0)
MCH: 29.9 pg (ref 26.0–34.0)
MCHC: 32.4 g/dL (ref 30.0–36.0)
MCV: 92.2 fL (ref 80.0–100.0)
Platelets: 203 10*3/uL (ref 150–400)
RBC: 4.72 MIL/uL (ref 3.87–5.11)
RDW: 13.4 % (ref 11.5–15.5)
WBC: 6.6 10*3/uL (ref 4.0–10.5)
nRBC: 0 % (ref 0.0–0.2)

## 2021-08-07 LAB — POC URINE PREG, ED: Preg Test, Ur: NEGATIVE

## 2021-08-07 NOTE — Discharge Instructions (Addendum)
-  Follow-up with your primary care provider as discussed.  -Return to the emergency department anytime if you begin to experience any new or worsening symptoms.

## 2021-08-07 NOTE — ED Provider Notes (Signed)
[facility] Provider Note    Event Date/Time   First MD Initiated Contact with Patient 08/07/21 1737     (approximate)   History   Chief Complaint Near Syncope   HPI Kathryn Lucas is a 61 y.o. female, history of breast cancer, hypothyroidism, presents to the emergency department for evaluation of near syncope.  Patient states that she works at [employer] and is currently working on a time sensitive business goal that has caused her intense anxiety over the past few days.  When she came into work today, she felt her heart racing and had a few episodes where she felt like she was going to pass out.  Denies any syncopal episode or LOC though.  Denies any recent illnesses or injuries.  Since being here in the emergency department, she states that she has felt fine.  Denies any pain.  Denies fever/chills, chest pain, shortness of breath, abdominal pain, flank pain, nausea/vomiting, headache, vision changes, hearing changes, lightheadedness/dizziness, or numbness/tingling upper or lower extremities.  History Limitations: No limitations.        Physical Exam  Triage Vital Signs: ED Triage Vitals  Enc Vitals Group     BP 08/07/21 1625 112/67     Pulse Rate 08/07/21 1625 82     Resp 08/07/21 1625 18     Temp 08/07/21 1625 98.7 F (37.1 C)     Temp Source 08/07/21 1625 Oral     SpO2 08/07/21 1625 100 %     Weight --      Height 08/07/21 1620 6' (1.829 m)     Head Circumference --      Peak Flow --      Pain Score 08/07/21 1620 0     Pain Loc --      Pain Edu? --      Excl. in [place]? --     Most recent vital signs: Vitals:   08/07/21 1625  BP: 112/67  Pulse: 82  Resp: 18  Temp: 98.7 F (37.1 C)  SpO2: 100%    General: Awake, NAD.  Skin: Warm, dry. No rashes or lesions.  Eyes: PERRL. Conjunctivae normal.  CV: Good peripheral perfusion.  S1-S2 present.  No murmurs, rubs, or gallops. Resp: Normal effort.  Lung sounds are clear bilaterally in the  apices and bases. Abd: Soft, non-tender. No distention.  Neuro: At baseline. No gross neurological deficits.   Focused Exam: N/A.  Physical Exam    ED Results / Procedures / Treatments  Labs (all labs ordered are listed, but only abnormal results are displayed) Labs Reviewed  URINALYSIS, ROUTINE W REFLEX MICROSCOPIC - Abnormal; Notable for the following components:      Result Value   Color, Urine COLORLESS (*)    APPearance CLEAR (*)    Specific Gravity, Urine 1.002 (*)    Hgb urine dipstick SMALL (*)    Leukocytes,Ua SMALL (*)    Bacteria, UA RARE (*)    All other components within normal limits  BASIC METABOLIC PANEL  CBC  POC URINE PREG, ED  CBG MONITORING, ED     EKG Sinus rhythm, rate of 82, no T-segment changes, normal QRS interval, normal PR interval, no QT prolongation, no axis deviations.  RADIOLOGY  ED Provider Interpretation: I personally reviewed and interpreted this chest x-ray, no evidence of acute cardiopulmonary disease.  DG Chest 2 View  Result Date: 08/07/2021 CLINICAL DATA:  Syncope EXAM: CHEST - 2 VIEW COMPARISON:  None Available. FINDINGS: The  heart size and mediastinal contours are within normal limits. Both lungs are clear. The visualized skeletal structures are unremarkable. Tissue expander left breast. IMPRESSION: No active cardiopulmonary disease. Electronically Signed   By: Franchot Gallo M.D.   On: 08/07/2021 18:19    PROCEDURES:  Critical Care performed: N/A.  Procedures    MEDICATIONS ORDERED IN ED: Medications - No data to display   IMPRESSION / MDM / [place] / ED COURSE  I reviewed the triage vital signs and the nursing notes.                              Differential diagnosis includes, but is not limited to, dehydration, vasovagal syncope, anxiety/depression, electrolyte abnormalities, anemia.  ED Course Patient appears well, vitals within normal limits.  NAD.  CBC shows no leukocytosis or anemia.  BMP shows  no evidence of electrolyte abnormalities or kidney injury.  Urinalysis shows small hemoglobin, small leukocytes, and rare bacteria.  In the absence of urinary symptoms, unlikely urinary tract infection.   Assessment/Plan Patient presents with endorsement of near syncope.  Given her history of recent stressors, likely related to anxiety.  She is asymptomatic at this time.  Very low suspicion for PE.  Unlikely ACS given unremarkable EKG.  Lab work-up has been reassuring.  Chest x-ray unremarkable.  We will plan to discharge this patient.  Advised her to follow-up with her primary care provider for ongoing anxiety/depression management.  Considered admission for this patient, but given her stable presentation, unremarkable work-up, she is unlikely to benefit from admission.  Provided the patient with anticipatory guidance, return precautions, and educational material. Encouraged the patient to return to the emergency department at any time if they begin to experience any new or worsening symptoms. Patient expressed understanding and agreed with the plan.       FINAL CLINICAL IMPRESSION(S) / ED DIAGNOSES   Final diagnoses:  Near syncope     Rx / DC Orders   ED Discharge Orders     None        Note:  This document was prepared using Dragon voice recognition software and may include unintentional dictation errors.   Teodoro Spray, [place] 08/07/21 1846    Blake Divine, MD 08/07/21 [phone]

## 2021-08-07 NOTE — ED Triage Notes (Signed)
Patient to the ER via POV with reports of near syncope this afternoon. Reports similar event in January. States that she thinks she might be dehydrated. Reports feeling like her heart was racing. Reports increased stress at work, states this is the most stress she has ever been under and wonders if she had a panic attack.

## 2021-08-07 NOTE — ED Notes (Signed)
E signature pad not working. Pt educated on discharge instructions and verbalized understanding.  

## 2021-08-07 NOTE — ED Notes (Signed)
See triage note. Pt reports she is here because she thinks she just "got over stressed today". Denies sx at this time. States last time this happened she was dehydrated from having too much caffeine.  Nad noted. Ambulatory to treatment room

## 2021-08-21 ENCOUNTER — Ambulatory Visit: Payer: Commercial Managed Care - PPO | Admitting: Podiatry

## 2021-08-21 DIAGNOSIS — Q828 Other specified congenital malformations of skin: Secondary | ICD-10-CM | POA: Diagnosis not present

## 2021-08-21 NOTE — Progress Notes (Signed)
Subjective:  Patient ID: Kathryn Lucas, female    DOB: [birth year],  MRN: [id number]  Chief Complaint  Patient presents with   Callouses     Left foot callus ongoing for 2 weeks , patient states it is more of a discomfort than pain    61 y.o. female presents with the above complaint.  Patient presents with new complaint left midfoot porokeratosis patient states painful to touch painful to walk on.  She would like to get evaluated.  She states her right foot is doing much better now.  She denies any other acute complaints she has not seen anyone else prior to see me for the left side.  She has not tried anything over-the-counter.  She went to get it looked at right away.   Review of Systems: Negative except as noted in the HPI. Denies N/V/F/Ch.  Past Medical History:  Diagnosis Date   Breast cancer ([place])    left breast cancer   Cancer ([place]) 09/2017   left breast cancer   Family history of breast cancer    Hypothyroidism    Personal history of radiation therapy 2019   Thyroid disease     Current Outpatient Medications:    anastrozole (ARIMIDEX) 1 MG tablet, Take 1 tablet (1 mg total) by mouth daily., Disp: 90 tablet, Rfl: 3   Calcium 500-100 MG-UNIT CHEW, Chew 1 tablet by mouth daily., Disp: 60 tablet, Rfl:    cholecalciferol (VITAMIN D3) 25 MCG (1000 UNIT) tablet, Take 1 tablet (1,000 Units total) by mouth daily., Disp: , Rfl:    levothyroxine (SYNTHROID, LEVOTHROID) 175 MCG tablet, Take 112 mcg by mouth. , Disp: , Rfl:    meloxicam (MOBIC) 15 MG tablet, Take 1 tablet (15 mg total) by mouth daily., Disp: 30 tablet, Rfl: 0   vitamin C (ASCORBIC ACID) 250 MG tablet, Take 1 tablet (250 mg total) by mouth daily., Disp: , Rfl:    zinc gluconate 50 MG tablet, Take 1 tablet (50 mg total) by mouth daily., Disp: , Rfl:   Social History   Tobacco Use  Smoking Status Never  Smokeless Tobacco Never    No Known Allergies Objective:  There were no vitals filed for this visit. There is  no height or weight on file to calculate BMI. Constitutional Well developed. Well nourished.  Vascular Dorsalis pedis pulses palpable bilaterally. Posterior tibial pulses palpable bilaterally. Capillary refill normal to all digits.  No cyanosis or clubbing noted. Pedal hair growth normal.  Neurologic Normal speech. Oriented to person, place, and time. Epicritic sensation to light touch grossly present bilaterally.  Dermatologic Hyperkeratotic lesion with central nucleated core noted.  Pain on palpation to the lesion.  No pinpoint bleeding noted.  No ulcers noted.  Left midfoot  Orthopedic: Normal joint ROM without pain or crepitus bilaterally. No visible deformities. No bony tenderness.   Radiographs: None Assessment:   1. Porokeratosis     Plan:  Patient was evaluated and treated and all questions answered.  Left midfoot porokeratosis -I explained to the patient the etiology of porokeratosis and various treatment options were discussed.  Given the amount of pain that she is having I believe she will benefit from aggressive debridement of the lesion followed by excision of the central nucleated core.  Patient agrees with the plan.  Using chisel blade to handle the lesion was debrided down to healthy striated tissue followed by excision of central nucleated core.  Patient had immediate relief.  No pinpoint bleeding noted. -Offloading pads were  dispensed  No follow-ups on file.

## 2021-09-30 ENCOUNTER — Other Ambulatory Visit: Payer: Self-pay | Admitting: Orthopaedic Surgery

## 2021-10-01 ENCOUNTER — Other Ambulatory Visit: Payer: Self-pay | Admitting: Orthopaedic Surgery

## 2021-10-01 DIAGNOSIS — M545 Low back pain, unspecified: Secondary | ICD-10-CM

## 2021-11-24 ENCOUNTER — Telehealth: Payer: Self-pay | Admitting: Hematology and Oncology

## 2021-11-24 NOTE — Telephone Encounter (Signed)
Rescheduled appointment per provider [facility]. Patient is aware of the changes made to her upcoming appointment. 

## 2021-11-26 ENCOUNTER — Other Ambulatory Visit: Payer: Self-pay | Admitting: Hematology and Oncology

## 2021-11-26 DIAGNOSIS — Z1231 Encounter for screening mammogram for malignant neoplasm of breast: Secondary | ICD-10-CM

## 2021-12-11 ENCOUNTER — Ambulatory Visit
Admission: RE | Admit: 2021-12-11 | Discharge: 2021-12-11 | Disposition: A | Discharge status: Home / Self Care | Payer: Commercial Managed Care - PPO | Source: Ambulatory Visit | Attending: Hematology and Oncology | Admitting: Hematology and Oncology

## 2021-12-11 DIAGNOSIS — Z1231 Encounter for screening mammogram for malignant neoplasm of breast: Secondary | ICD-10-CM

## 2021-12-17 ENCOUNTER — Ambulatory Visit: Payer: Commercial Managed Care - PPO | Admitting: Hematology and Oncology

## 2021-12-18 ENCOUNTER — Other Ambulatory Visit: Payer: Self-pay | Admitting: Internal Medicine

## 2021-12-18 ENCOUNTER — Ambulatory Visit
Admission: RE | Admit: 2021-12-18 | Discharge: 2021-12-18 | Disposition: A | Discharge status: Home / Self Care | Payer: Commercial Managed Care - PPO | Source: Ambulatory Visit | Attending: Orthopaedic Surgery | Admitting: Orthopaedic Surgery

## 2021-12-18 ENCOUNTER — Other Ambulatory Visit: Payer: Self-pay | Admitting: Hematology and Oncology

## 2021-12-18 DIAGNOSIS — M545 Low back pain, unspecified: Secondary | ICD-10-CM

## 2021-12-18 DIAGNOSIS — R748 Abnormal levels of other serum enzymes: Secondary | ICD-10-CM

## 2021-12-18 MED ORDER — IOPAMIDOL (ISOVUE-M 200) INJECTION 41%
15.0000 mL | Freq: Once | INTRAMUSCULAR | Status: AC
  Administered 2021-12-18: 15 mL via INTRATHECAL

## 2021-12-18 MED ORDER — MEPERIDINE HCL 50 MG/ML IJ SOLN: 50.0000 mg | Freq: Once | INTRAMUSCULAR | Status: DC | PRN

## 2021-12-18 MED ORDER — ONDANSETRON HCL 4 MG/2ML IJ SOLN: 4.0000 mg | Freq: Once | INTRAMUSCULAR | Status: DC | PRN

## 2021-12-18 MED ORDER — DIAZEPAM 5 MG PO TABS
10.0000 mg | ORAL_TABLET | Freq: Once | ORAL | Status: AC
  Administered 2021-12-18: 5 mg via ORAL

## 2021-12-18 NOTE — Discharge Instructions (Signed)

## 2021-12-19 ENCOUNTER — Telehealth: Payer: Self-pay | Admitting: Pharmacy Technician

## 2021-12-19 ENCOUNTER — Inpatient Hospital Stay (HOSPITAL_BASED_OUTPATIENT_CLINIC_OR_DEPARTMENT_OTHER): Payer: Commercial Managed Care - PPO | Admitting: Hematology and Oncology

## 2021-12-19 ENCOUNTER — Other Ambulatory Visit (HOSPITAL_COMMUNITY): Payer: Self-pay

## 2021-12-19 ENCOUNTER — Telehealth: Payer: Self-pay

## 2021-12-19 ENCOUNTER — Encounter: Payer: Self-pay | Admitting: Hematology and Oncology

## 2021-12-19 VITALS — BP 118/77 | HR 120 | Temp 97.5°F | Resp 18 | Ht 72.0 in | Wt 153.9 lb

## 2021-12-19 DIAGNOSIS — Z170 Estrogen receptor positive status [ER+]: Secondary | ICD-10-CM | POA: Insufficient documentation

## 2021-12-19 DIAGNOSIS — Z9012 Acquired absence of left breast and nipple: Secondary | ICD-10-CM | POA: Insufficient documentation

## 2021-12-19 DIAGNOSIS — C50512 Malignant neoplasm of lower-outer quadrant of left female breast: Secondary | ICD-10-CM

## 2021-12-19 DIAGNOSIS — Z5111 Encounter for antineoplastic chemotherapy: Secondary | ICD-10-CM | POA: Insufficient documentation

## 2021-12-19 DIAGNOSIS — C7951 Secondary malignant neoplasm of bone: Secondary | ICD-10-CM | POA: Insufficient documentation

## 2021-12-19 DIAGNOSIS — C799 Secondary malignant neoplasm of unspecified site: Secondary | ICD-10-CM | POA: Diagnosis not present

## 2021-12-19 DIAGNOSIS — Z79811 Long term (current) use of aromatase inhibitors: Secondary | ICD-10-CM | POA: Diagnosis not present

## 2021-12-19 DIAGNOSIS — Z510 Encounter for antineoplastic radiation therapy: Secondary | ICD-10-CM | POA: Diagnosis present

## 2021-12-19 MED ORDER — ABEMACICLIB 100 MG PO TABS
100.0000 mg | ORAL_TABLET | Freq: Two times a day (BID) | ORAL | 3 refills | Status: DC
  Filled 2021-12-19: qty 70, 35d supply, fill #0

## 2021-12-19 MED ORDER — OXYCODONE-ACETAMINOPHEN 5-325 MG PO TABS: 1.0000 | ORAL_TABLET | ORAL | 0 refills | Status: DC | PRN

## 2021-12-19 MED ORDER — ABEMACICLIB 100 MG PO TABS: 100.0000 mg | ORAL_TABLET | Freq: Two times a day (BID) | ORAL | 3 refills | Status: DC

## 2021-12-19 NOTE — Progress Notes (Signed)
Patient Care Team: Pa, [name] as PCP - General Nicholas Lose, MD as Consulting Physician (Hematology and Oncology) Stark Klein, MD as Consulting Physician (General Surgery) Kyung Rudd, MD as Consulting Physician (Radiation Oncology)  DIAGNOSIS:  Encounter Diagnoses  Name Primary?   Malignant neoplasm of lower-outer quadrant of left breast of female, estrogen receptor positive ([place])    Metastatic malignant neoplasm, unspecified site ([place]) Yes    SUMMARY OF ONCOLOGIC HISTORY: Oncology History  Malignant neoplasm of lower-outer quadrant of left breast of female, estrogen receptor positive ([place])  06/11/2016 Mammogram   Palpable left breast masses 3:00 position: 2.2 cm; 5:30 position: 2.5 cm; 6:30 position: 0.7 cm   06/19/2016 Initial Diagnosis   Left breast biopsy 3:30: IDC with DCIS grade 1, ER 90%, PR 50%, Ki-67 15%, HER-2 negative ratio 1.13; biopsy 5:30 position: IDC grade 1   07/13/2016 Breast MRI   Large area of abnormal enhancement lower inner and lower outer quadrants left breast spanning 9 cm x 6.4 cm x 5.3 cm, no abnormal enlarged lymph nodes; T3 N0 stage II a (New AJCC staging)    07/15/2016 - 12/08/2017 Anti-estrogen oral therapy   Neoadjuvant anastrozole 1 mg daily   07/17/2016 Oncotype testing   Testing done on the biopsy: Oncotype DX score 22, intermediate risk   02/02/2017 Breast MRI   Left breast multicentric disease unchanged measuring 2.7 x 1.6 cm.  Mass in the non-mass enhancement are also not significantly changed measuring 6.2 x 2.4 cm. new enhancing mass within the outer right breast 7 mm which could be fat necrosis or inclusion cyst    02/09/2017 Imaging   Ultrasound of the right breast lesion noted on MRI: No sonographic finding corresponds to the abnormality noted on MRI   07/13/2017 Cancer Staging   Staging form: Breast, AJCC 8th Edition - Clinical stage from 07/13/2017: Stage IIA (cT3, cN0, cM0, G1, ER+, PR+, HER2-) - Signed by  Gardenia Phlegm, NP on 05/18/2018   10/26/2017 Surgery   Left mastectomy: IDC grade 1, 2 foci largest spans 8.5 cm, intermediate grade DCIS, lymphovascular invasion identified, perineural invasion identified, 1/2 lymph nodes positive with extracapsular extension, ER 9200%, PR 5 to 50%, HER-2 negative, Ki-67 10 to 15%, T3N1A Mammaprint: low risk   11/02/2017 Cancer Staging   Staging form: Breast, AJCC 8th Edition - Pathologic: No Stage Recommended (ypT3, pN1a, cM0, G1, ER+, PR+, HER2-) - Signed by Nicholas Lose, MD on 11/02/2017   12/08/2017 - 01/26/2018 Radiation Therapy   Adjuvant radiation therapy    02/2018 -  Anti-estrogen oral therapy   Anastrozole 1 mg daily adjuvant therapy     CHIEF COMPLIANT: Follow-up of left breast cancer on anastrozole    INTERVAL HISTORY: Kathryn Lucas is a 61 y.o. with above-mentioned history of left breast cancer treated with mastectomy, radiation therapy, and is currently on anti-estrogen therapy with anastrozole.  She presented with low back pain which appeared to be like a pinched nerve over the past several months.  Initially she went to sports medicine and then subsequently to orthopedics.  She went through multiple lines of physical therapy and Occupational Therapy but her pain continued to be on and off.  She underwent CT myelogram yesterday and we got the results which showed that she has widespread metastatic disease concerning for metastatic breast cancer.  We asked her to come in today to be seen.  She is accompanied by a friend.  Her only support is and her next of kin  who is her sister who lives in [place]. She has been on FMLA from work for the past several months because of intractable pain.  She has been prescribed gabapentin which helps her somewhat but not taking away the edge of the pain.   ALLERGIES:  has No Known Allergies.  MEDICATIONS:  Current Outpatient Medications  Medication Sig Dispense Refill   abemaciclib (VERZENIO) 100  MG tablet Take 1 tablet (100 mg total) by mouth 2 (two) times daily. 60 tablet 3   oxyCODONE-acetaminophen (PERCOCET/ROXICET) 5-325 MG tablet Take 1 tablet by mouth every 4 (four) hours as needed for severe pain. 90 tablet 0   anastrozole (ARIMIDEX) 1 MG tablet TAKE ONE TABLET BY MOUTH DAILY 90 tablet 3   Calcium 500-100 MG-UNIT CHEW Chew 1 tablet by mouth daily. 60 tablet    cholecalciferol (VITAMIN D3) 25 MCG (1000 UNIT) tablet Take 1 tablet (1,000 Units total) by mouth daily.     levothyroxine (SYNTHROID, LEVOTHROID) 175 MCG tablet Take 112 mcg by mouth.      meloxicam (MOBIC) 15 MG tablet Take 1 tablet (15 mg total) by mouth daily. 30 tablet 0   vitamin C (ASCORBIC ACID) 250 MG tablet Take 1 tablet (250 mg total) by mouth daily.     zinc gluconate 50 MG tablet Take 1 tablet (50 mg total) by mouth daily.     No current facility-administered medications for this visit.    PHYSICAL EXAMINATION: ECOG PERFORMANCE STATUS: 2 - Symptomatic, <50% confined to bed  Vitals:   12/19/21 1230  BP: 118/77  Pulse: (!) 120  Resp: 18  Temp: (!) 97.5 F (36.4 C)  SpO2: 96%   Filed Weights   12/19/21 1230  Weight: 153 lb 14.4 oz (69.8 kg)      LABORATORY DATA:  I have reviewed the data as listed    Latest Ref Rng & Units 08/07/2021    4:22 PM 04/19/2021    6:08 PM  CMP  Glucose 70 - 99 mg/dL 92  128   BUN 6 - 20 mg/dL 18  24   Creatinine 0.44 - 1.00 mg/dL 0.91  0.93   Sodium 135 - 145 mmol/L 135  134   Potassium 3.5 - 5.1 mmol/L 4.2  3.6   Chloride 98 - 111 mmol/L 102  101   CO2 22 - 32 mmol/L 27  27   Calcium 8.9 - 10.3 mg/dL 9.0  9.1     Lab Results  Component Value Date   WBC 6.6 08/07/2021   HGB 14.1 08/07/2021   HCT 43.5 08/07/2021   MCV 92.2 08/07/2021   PLT 203 08/07/2021    ASSESSMENT & PLAN:  Malignant neoplasm of lower-outer quadrant of left breast of female, estrogen receptor positive ([place]) 06/19/2016 Left breast biopsy 3:30: IDC with DCIS grade 1, ER 90%, PR 50%,  Ki-67 15%, HER-2 negative ratio 1.13; biopsy 5:30 position: IDC grade 1   10/26/17: Left mastectomy: IDC grade 1, 2 foci largest spans 8.5 cm, intermediate grade DCIS, lymphovascular invasion identified, perineural invasion identified, 1/2 lymph nodes positive with extracapsular extension, ER 9200%, PR 5 to 50%, HER-2 negative, Ki-67 10 to 15%, T3N1A   Oncotype DX score 22, intermediate risk, chemotherapy not felt to have significant benefit.   Treatment plan: 1. Antiestrogen therapy with anastrozole 1 mg daily started 07/15/2016 2. Mastectomy 10/26/2017, Mammaprint low risk luminal type A 3. Followed by adjuvant radiation 12/08/17- 01/26/18  4. Followed by adjuvant antiestrogen therapy anastrozole started 01/17/2018 (originally started 07/15/2016) --------------------------------------------------------------------  Low back pain August 2023: Underwent CT myelogram: Large expansile lesion in the sacrum with extraosseous extension of the tumor, diffuse lytic lesions throughout the visualized spine with metastatic disease myeloma is considered less likely.  (This was ordered by Dr. Melrose Nakayama)  Urgent appointment today to discuss these results. Treatment plan: 1.  PET CT scan 2. biopsy of sacrum: I discussed with Dr. Vernard Gambles who agreed to get the sacral biopsy done. 3.  Systemic therapy based upon the biopsy results.  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  She will start Faslodex next week. 4.  Xgeva for bone metastases. 5.  Palliative radiation to the sacrum  Abemaciclib counseling: I discussed at length the risks and benefits of Abemaciclib in combination with letrozole. Adverse effects of Abemaciclib include decreasing neutrophil count, pneumonia, blood clots in lungs as well as nausea and GI symptoms. Side effects of letrozole include hot flashes, muscle aches and pains, uterine bleeding/spotting/cancer,  osteoporosis, risk of blood clots.  Return to clinic to see me in 3 weeks with labs and follow-up. I spoke to patient's Sister Ephraim Hamburger [phone] and updated her with the results and the plan.  She is her next of kin and power of attorney.   Orders Placed This Encounter  Procedures   NM PET Image Initial (PI) Skull Base To Thigh    Standing Status:   Future    Standing Expiration Date:   12/19/2022    Order Specific Question:   If indicated for the ordered procedure, I authorize the administration of a radiopharmaceutical per Radiology protocol    Answer:   Yes    Order Specific Question:   Is the patient pregnant?    Answer:   No    Order Specific Question:   Preferred imaging location?    Answer:   [facility]    Order Specific Question:   Release to patient    Answer:   Immediate   CT Biopsy    Standing Status:   Future    Standing Expiration Date:   12/19/2022    Scheduling Instructions:     Dr.Hassell approved it     Send for breast prog panel and Caris    Order Specific Question:   Lab orders requested (DO NOT place separate lab orders, these will be automatically ordered during procedure specimen collection):    Answer:   Surgical Pathology    Order Specific Question:   Reason for Exam (SYMPTOM  OR DIAGNOSIS REQUIRED)    Answer:   Metastatic breast cancer Sacral biopsy    Order Specific Question:   Is patient pregnant?    Answer:   No    Order Specific Question:   Preferred location?    Answer:   [facility]    Comments:   Dr.Hassell approved it   The patient has a good understanding of the overall plan. she agrees with it. she will call with any problems that may develop before the next visit here. Total time spent: 60 mins including face to face time and time spent for planning, charting and co-ordination of care   Harriette Ohara, MD 12/19/21    I Gardiner Coins am scribing for Dr. Lindi Adie  I have reviewed the above documentation for accuracy  and completeness, and I agree with the above.

## 2021-12-19 NOTE — Telephone Encounter (Addendum)
Oral Oncology Patient Advocate Encounter   Received notification that prior authorization for Verzenio is required.   PA submitted on 12/19/2021 Key [id number] Status is pending     Lady Deutscher, CPhT-Adv Oncology Pharmacy Patient [place] Direct Number: [phone]  Fax: [phone]

## 2021-12-19 NOTE — Assessment & Plan Note (Signed)
06/19/2016 Left breast biopsy 3:30: IDC with DCIS grade 1, ER 90%, PR 50%, Ki-67 15%, HER-2 negative ratio 1.13; biopsy 5:30 position: IDC grade 1  10/26/17:Left mastectomy: IDC grade 1, 2 foci largest spans 8.5 cm, intermediate grade DCIS, lymphovascular invasion identified, perineural invasion identified, 1/2 lymph nodes positive with extracapsular extension, ER 9200%, PR 5 to 50%, HER-2 negative, Ki-67 10 to 15%, T3N1A  Oncotype DXscore 22, intermediate risk, chemotherapy not felt to have significant benefit.  Treatment plan: 1. Antiestrogen therapy with anastrozole 1 mg daily started 07/15/2016 2.Mastectomy8/13/2019,Mammaprint low risk luminal typeA 3. Followed by adjuvant radiation9/25/19- 01/26/18 4. Followed by adjuvant antiestrogen therapy anastrozole started 01/17/2018 (originally started 07/15/2016) -------------------------------------------------------------------- Low back pain August 2023: Underwent CT myelogram: Large expansile lesion in the sacrum with extraosseous extension of the tumor, diffuse lytic lesions throughout the visualized spine with metastatic disease myeloma is considered less likely.  (This was ordered by Dr. Peter Dalldorf)  Urgent appointment today to discuss these results. Treatment plan: 1.  PET CT scan 2. biopsy of one of the lesions 3.  Systemic therapy based upon the biopsy results. 4.  Based aspirates because of bone metastases. 5.  Palliative radiation to the sacrum   

## 2021-12-19 NOTE — Telephone Encounter (Addendum)
Oral Oncology Pharmacist Encounter  Received new prescription for Ibrance for the treatment of metastatic HR positive, HER2 negative breast cancer in conjunction with Faslodex, planned duration until disease progression or unacceptable toxicity.  Labs from 08/07/21 assessed, no interventions needed. Prescription dose and frequency assessed.  Current medication list in Epic reviewed, no DDIs with Ibrance identified.  Evaluated chart and no patient barriers to medication adherence noted.   Patient agreement for treatment documented in MD note on 12/19/2021.  Prescription has been e-scribed to the [facility] for benefits analysis and approval.  Oral Oncology Clinic will continue to follow for insurance authorization, copayment issues, initial counseling and start date.  Drema Halon, PharmD Hematology/Oncology Clinical Pharmacist [facility] [phone] 12/19/2021 1:12 PM

## 2021-12-22 ENCOUNTER — Other Ambulatory Visit (HOSPITAL_COMMUNITY): Payer: Self-pay

## 2021-12-22 ENCOUNTER — Encounter: Payer: Self-pay | Admitting: Hematology and Oncology

## 2021-12-22 ENCOUNTER — Telehealth: Payer: Self-pay | Admitting: Pharmacy Technician

## 2021-12-22 ENCOUNTER — Other Ambulatory Visit: Payer: Self-pay | Admitting: Hematology and Oncology

## 2021-12-22 MED ORDER — ABEMACICLIB 100 MG PO TABS: 100.0000 mg | ORAL_TABLET | Freq: Two times a day (BID) | ORAL | 3 refills | Status: DC

## 2021-12-22 MED ORDER — PALBOCICLIB 100 MG PO CAPS
100.0000 mg | ORAL_CAPSULE | Freq: Every day | ORAL | 3 refills | Status: DC
  Filled 2021-12-22: qty 21, 21d supply, fill #0

## 2021-12-22 MED ORDER — ABEMACICLIB 100 MG PO TABS
100.0000 mg | ORAL_TABLET | Freq: Two times a day (BID) | ORAL | 3 refills | Status: DC
  Filled 2021-12-22: qty 70, 35d supply, fill #0
  Filled 2021-12-22: qty 56, 28d supply, fill #0

## 2021-12-22 NOTE — Progress Notes (Signed)
Patient's insurance does not cover Verzinio.  The preferred medication is Ibrance. So I sent a new prescription for Ibrance 100 mg a day starting dose.  She gets it 3 weeks on 1 week off schedule.

## 2021-12-22 NOTE — Telephone Encounter (Signed)
Oral Oncology Patient Advocate Encounter   Received notification that prior authorization for Kathryn Lucas is required.   PA submitted on 12/22/2021 [place] Status is pending     Lady Deutscher, CPhT-Adv Oncology Pharmacy Patient [place] Direct Number: [phone]  Fax: [phone]

## 2021-12-22 NOTE — Telephone Encounter (Signed)
Oral Oncology Patient Advocate Encounter  Received notification that Kathryn Lucas is non-preferred by patient's insurance. PA has been cancelled.  Kathryn Lucas, CPhT-Adv Oncology Pharmacy Patient [place] Direct Number: [phone]  Fax: [phone]

## 2021-12-22 NOTE — Telephone Encounter (Signed)
Oral Oncology Patient Advocate Encounter  Prior Authorization for Kathryn Lucas has been approved.    PA# [id number] Effective dates: 12/22/2021 through 06/21/2021  Patient must enroll with [employer] secondary benefits investigation services per her insurance requirements before claim can be processed. [employer] phone [phone]  I have spoken with the patient.     Kathryn Lucas, CPhT-Adv Oncology Pharmacy Patient [place] [address] Direct Number: [phone]  Fax: [phone]

## 2021-12-23 ENCOUNTER — Telehealth: Payer: Self-pay | Admitting: *Deleted

## 2021-12-23 ENCOUNTER — Other Ambulatory Visit: Payer: Self-pay | Admitting: *Deleted

## 2021-12-23 ENCOUNTER — Other Ambulatory Visit (HOSPITAL_COMMUNITY): Payer: Self-pay

## 2021-12-23 ENCOUNTER — Other Ambulatory Visit: Payer: Self-pay

## 2021-12-23 DIAGNOSIS — M25562 Pain in left knee: Secondary | ICD-10-CM

## 2021-12-23 MED ORDER — ONDANSETRON HCL 8 MG PO TABS: 8.0000 mg | ORAL_TABLET | Freq: Three times a day (TID) | ORAL | 3 refills | Status: DC | PRN

## 2021-12-23 NOTE — Progress Notes (Signed)
Received call from pt stating she fell over an elevated threshold in her house yesterday and is experiencing left knee pain and swelling.  Verbal orders received from MD for pt to undergo left knee xray for further evaluation.  Orders placed at [place] [facility].  Pt educated to contact location for scheduled. Pt verbalized understanding.

## 2021-12-23 NOTE — Progress Notes (Unsigned)
Kathryn Daft, MD  [name] to schedule CT guided bone biopsy of sacrum or iliac bone lesion.   [place]

## 2021-12-23 NOTE — Progress Notes (Unsigned)
Kathryn Cleveland, MD  [name]   CT core sacral mass   [facility]

## 2021-12-23 NOTE — Telephone Encounter (Signed)
Received call from pt with complaint of nausea/vomiting, and constipation with percocet.  Verbal orders received from MD for pt to be prescribed Zofran 8 mg p.o TID PRN.  Pt educated on OTC colace, miralax,and glycerin suppositories. Pt also educated to taking Zofran 30 min prior to Percocet and to eat a small snack prior to the Percocet.  Prescription sent to pharmacy on file, pt educated and verbalized understanding.

## 2021-12-23 NOTE — Progress Notes (Signed)
Pt called and LVM stating she is experiencing nausea and constipation, asking for advice. Attempted to call pt and LVM for call back.

## 2021-12-24 ENCOUNTER — Other Ambulatory Visit: Payer: Self-pay | Admitting: Hematology and Oncology

## 2021-12-24 ENCOUNTER — Ambulatory Visit
Admission: RE | Admit: 2021-12-24 | Discharge: 2021-12-24 | Disposition: A | Discharge status: Home / Self Care | Payer: Commercial Managed Care - PPO | Source: Ambulatory Visit | Attending: Internal Medicine | Admitting: Internal Medicine

## 2021-12-24 ENCOUNTER — Other Ambulatory Visit: Payer: Self-pay | Admitting: *Deleted

## 2021-12-24 ENCOUNTER — Other Ambulatory Visit: Payer: Commercial Managed Care - PPO

## 2021-12-24 ENCOUNTER — Telehealth: Payer: Self-pay

## 2021-12-24 ENCOUNTER — Telehealth: Payer: Self-pay | Admitting: *Deleted

## 2021-12-24 ENCOUNTER — Encounter: Payer: Self-pay | Admitting: *Deleted

## 2021-12-24 ENCOUNTER — Ambulatory Visit
Admission: RE | Admit: 2021-12-24 | Discharge: 2021-12-24 | Disposition: A | Discharge status: Home / Self Care | Payer: Commercial Managed Care - PPO | Source: Ambulatory Visit | Attending: Hematology and Oncology | Admitting: Hematology and Oncology

## 2021-12-24 ENCOUNTER — Telehealth: Payer: Self-pay | Admitting: Pharmacy Technician

## 2021-12-24 DIAGNOSIS — M25562 Pain in left knee: Secondary | ICD-10-CM

## 2021-12-24 DIAGNOSIS — R748 Abnormal levels of other serum enzymes: Secondary | ICD-10-CM

## 2021-12-24 DIAGNOSIS — Z170 Estrogen receptor positive status [ER+]: Secondary | ICD-10-CM

## 2021-12-24 DIAGNOSIS — Z515 Encounter for palliative care: Secondary | ICD-10-CM

## 2021-12-24 MED ORDER — GLYCERIN (ADULT) 2 G RE SUPP: 1.0000 | RECTAL | 0 refills | Status: DC | PRN

## 2021-12-24 MED ORDER — PROMETHAZINE HCL 25 MG RE SUPP: 25.0000 mg | Freq: Four times a day (QID) | RECTAL | 0 refills | Status: DC | PRN

## 2021-12-24 NOTE — Progress Notes (Signed)
Patient for bone lesion biopsy 10/13, unable to reach patient, spoke with sister on phone with pre procedure instructions given. Made aware to be here at 1000, NPO after [place] prior to procedure and driver post procedure/recovery/discharge. Stated understanding.

## 2021-12-24 NOTE — Telephone Encounter (Signed)
Spoke with patient and scheduled a Mychart Palliative Consult for 12/30/21 @ 10 AM.  Consent obtained; updated Netsmart, Team List and Epic.

## 2021-12-24 NOTE — Progress Notes (Signed)
Received call from pt with complaint of ongoing nausea and vomiting no alleviate with Zofran p.o.  per MD pt needing to stop taking Percocet due to GI upset and take OTC tylenol PRN for pain.  Verbal orders received from MD for pt to be prescribed Phenergan Suppository 25 mg PRN.  Prescription sent to pharmacy on file, pt educated and verbalized understanding.

## 2021-12-24 NOTE — Progress Notes (Signed)
Received call from pt requesting referral be placed for palliative care for [facility] RN, social work, and pain management.  Referral placed for palliative care with our [facility] provider as well as [facility] home palliative.  Intake called to [place] with [facility] who states she will work on setting pt up.

## 2021-12-24 NOTE — Telephone Encounter (Signed)
MD reviewed recent left knee xray showing broken patella bone.  Per MD pt needing to f/u with Orthopaedic for further evaluation and tx.  Pt currently scheduled this Saturday 12/27/21. Pt educated to elevate extremity, apply ice and take OTC tylenol/ibuprofen for discomfort. Pt educated to seek care at the nearest ED if pain becomes severe. Pt verbalized understanding.

## 2021-12-24 NOTE — Telephone Encounter (Signed)
Oral Oncology Patient Advocate Encounter  Received fax from [facility] requesting signatures for patient assistance for VERZENIO.  Called representative, Allena Katz, at [phone] to notify that the patient is not on this medication. Left a voicemail.  Patient should be on Ibrance in the [employer] system.   I will continue to follow up until this is resolved.  Lady Deutscher, CPhT-Adv Oncology Pharmacy Patient [place] Direct Number: [phone]  Fax: [phone]

## 2021-12-25 ENCOUNTER — Other Ambulatory Visit: Payer: Self-pay | Admitting: *Deleted

## 2021-12-25 ENCOUNTER — Encounter: Payer: Self-pay | Admitting: *Deleted

## 2021-12-25 ENCOUNTER — Inpatient Hospital Stay: Payer: Commercial Managed Care - PPO

## 2021-12-25 ENCOUNTER — Other Ambulatory Visit: Payer: Self-pay | Admitting: Student

## 2021-12-25 ENCOUNTER — Other Ambulatory Visit (HOSPITAL_COMMUNITY): Payer: Self-pay

## 2021-12-25 ENCOUNTER — Encounter (HOSPITAL_COMMUNITY)
Admission: RE | Admit: 2021-12-25 | Discharge: 2021-12-25 | Disposition: A | Discharge status: Home / Self Care | Payer: Commercial Managed Care - PPO | Source: Ambulatory Visit | Attending: Hematology and Oncology | Admitting: Hematology and Oncology

## 2021-12-25 VITALS — BP 109/70 | HR 103 | Temp 98.6°F

## 2021-12-25 DIAGNOSIS — C799 Secondary malignant neoplasm of unspecified site: Secondary | ICD-10-CM

## 2021-12-25 DIAGNOSIS — C50512 Malignant neoplasm of lower-outer quadrant of left female breast: Secondary | ICD-10-CM

## 2021-12-25 DIAGNOSIS — Z170 Estrogen receptor positive status [ER+]: Secondary | ICD-10-CM | POA: Insufficient documentation

## 2021-12-25 LAB — CBC WITH DIFFERENTIAL (CANCER CENTER ONLY)
Abs Immature Granulocytes: 0.01 10*3/uL (ref 0.00–0.07)
Basophils Absolute: 0 10*3/uL (ref 0.0–0.1)
Basophils Relative: 0 %
Eosinophils Absolute: 0 10*3/uL (ref 0.0–0.5)
Eosinophils Relative: 1 %
HCT: 36.9 % (ref 36.0–46.0)
Hemoglobin: 12.5 g/dL (ref 12.0–15.0)
Immature Granulocytes: 0 %
Lymphocytes Relative: 13 %
Lymphs Abs: 0.7 10*3/uL (ref 0.7–4.0)
MCH: 29.6 pg (ref 26.0–34.0)
MCHC: 33.9 g/dL (ref 30.0–36.0)
MCV: 87.2 fL (ref 80.0–100.0)
Monocytes Absolute: 0.3 10*3/uL (ref 0.1–1.0)
Monocytes Relative: 6 %
Neutro Abs: 4.2 10*3/uL (ref 1.7–7.7)
Neutrophils Relative %: 80 %
Platelet Count: 208 10*3/uL (ref 150–400)
RBC: 4.23 MIL/uL (ref 3.87–5.11)
RDW: 14.6 % (ref 11.5–15.5)
WBC Count: 5.2 10*3/uL (ref 4.0–10.5)
nRBC: 0 % (ref 0.0–0.2)

## 2021-12-25 LAB — CMP (CANCER CENTER ONLY)
ALT: 112 U/L — ABNORMAL HIGH (ref 0–44)
AST: 236 U/L (ref 15–41)
Albumin: 4 g/dL (ref 3.5–5.0)
Alkaline Phosphatase: 131 U/L — ABNORMAL HIGH (ref 38–126)
Anion gap: 9 (ref 5–15)
BUN: 17 mg/dL (ref 6–20)
CO2: 29 mmol/L (ref 22–32)
Calcium: 11.6 mg/dL — ABNORMAL HIGH (ref 8.9–10.3)
Chloride: 96 mmol/L — ABNORMAL LOW (ref 98–111)
Creatinine: 0.96 mg/dL (ref 0.44–1.00)
GFR, Estimated: >60 mL/min (ref >60)
Glucose, Bld: 147 mg/dL — ABNORMAL HIGH (ref 70–99)
Potassium: 3.4 mmol/L — ABNORMAL LOW (ref 3.5–5.1)
Sodium: 134 mmol/L — ABNORMAL LOW (ref 135–145)
Total Bilirubin: 0.9 mg/dL (ref 0.3–1.2)
Total Protein: 7.2 g/dL (ref 6.5–8.1)

## 2021-12-25 LAB — GLUCOSE, CAPILLARY: Glucose-Capillary: 91 mg/dL (ref 70–99)

## 2021-12-25 MED ORDER — FLUDEOXYGLUCOSE F - 18 (FDG) INJECTION
7.5000 | Freq: Once | INTRAVENOUS | Status: AC | PRN
  Administered 2021-12-25: 7.63 via INTRAVENOUS

## 2021-12-25 MED ORDER — FULVESTRANT 250 MG/5ML IM SOSY
500.0000 mg | PREFILLED_SYRINGE | Freq: Once | INTRAMUSCULAR | Status: AC
  Administered 2021-12-25: 500 mg via INTRAMUSCULAR
  Filled 2021-12-25: qty 10

## 2021-12-25 MED ORDER — DENOSUMAB 120 MG/1.7ML ~~LOC~~ SOLN
120.0000 mg | Freq: Once | SUBCUTANEOUS | Status: AC
  Administered 2021-12-25: 120 mg via SUBCUTANEOUS
  Filled 2021-12-25: qty 1.7

## 2021-12-25 NOTE — Telephone Encounter (Signed)
Oral Oncology Patient Advocate Encounter   Called [employer] [phone] to follow up on status of Ibrance being approved so that we can move forward with filling the rx for the patient. Allena Katz, [employer] representative, states that the plan does show the PA approval for Ibrance but that the patient's formulary does not cover ANY specialty medications and patient would be responsible for 100% of drug cost.  I have spoke to the patient's insurance pharmacy benefit provider multiple times and each time they tell me that the medication is covered and the only barrier to getting a paid claim is the secondary approval from [employer].  I offered to conference the plan representative in while on the phone with Allena Katz, but the offer was declined. I hope to resolve the conflicting information as soon as possible. Allena Katz stated that he would call me back before end of day 12/25/2021.  Patient is not eligible for patient assistance through [employer] because they are enrolled in [employer].   I will continue to follow up until this is resolved.   Lady Deutscher, CPhT-Adv Oncology Pharmacy Patient [place] Direct Number: [phone]  Fax: [phone]

## 2021-12-25 NOTE — Progress Notes (Signed)
CRITICAL VALUE STICKER  CRITICAL VALUE: AST [id number]  RECEIVER (on-site recipient of call): Merleen Nicely, [place] NOTIFIED: 12/25/21 at 49  MD NOTIFIED: Nicholas Lose, MD  [place]: 12/25/21 at 1456  RESPONSE: MD notified and verbalized understanding, no orders received at this time.

## 2021-12-26 ENCOUNTER — Ambulatory Visit: Payer: Commercial Managed Care - PPO | Admitting: Hematology and Oncology

## 2021-12-26 ENCOUNTER — Ambulatory Visit
Admission: RE | Admit: 2021-12-26 | Discharge: 2021-12-26 | Disposition: A | Discharge status: Home / Self Care | Payer: Commercial Managed Care - PPO | Source: Ambulatory Visit | Attending: Hematology and Oncology | Admitting: Hematology and Oncology

## 2021-12-26 ENCOUNTER — Other Ambulatory Visit (HOSPITAL_COMMUNITY): Payer: Self-pay

## 2021-12-26 ENCOUNTER — Other Ambulatory Visit: Payer: Self-pay

## 2021-12-26 DIAGNOSIS — Z170 Estrogen receptor positive status [ER+]: Secondary | ICD-10-CM | POA: Diagnosis present

## 2021-12-26 DIAGNOSIS — C7951 Secondary malignant neoplasm of bone: Secondary | ICD-10-CM | POA: Diagnosis not present

## 2021-12-26 DIAGNOSIS — C50512 Malignant neoplasm of lower-outer quadrant of left female breast: Secondary | ICD-10-CM | POA: Insufficient documentation

## 2021-12-26 DIAGNOSIS — C799 Secondary malignant neoplasm of unspecified site: Secondary | ICD-10-CM

## 2021-12-26 MED ORDER — MIDAZOLAM HCL 2 MG/2ML IJ SOLN
INTRAMUSCULAR | Status: AC | PRN
  Administered 2021-12-26 (×2): 1 mg via INTRAVENOUS

## 2021-12-26 MED ORDER — FENTANYL CITRATE (PF) 100 MCG/2ML IJ SOLN
INTRAMUSCULAR | Status: AC
  Filled 2021-12-26: qty 2

## 2021-12-26 MED ORDER — SODIUM CHLORIDE 0.9 % IV SOLN: INTRAVENOUS | Status: DC

## 2021-12-26 MED ORDER — FENTANYL CITRATE (PF) 100 MCG/2ML IJ SOLN
INTRAMUSCULAR | Status: AC | PRN
  Administered 2021-12-26 (×2): 50 ug via INTRAVENOUS

## 2021-12-26 MED ORDER — MIDAZOLAM HCL 2 MG/2ML IJ SOLN
INTRAMUSCULAR | Status: AC
  Filled 2021-12-26: qty 2

## 2021-12-26 NOTE — Consult Note (Signed)
Chief Complaint: Patient was seen in consultation today for sacral bone biopsy at the request of [place]  Referring Physician(s): Gudena,Vinay  Supervising Physician: Arne Cleveland  Patient Status: [facility] - In-pt  History of Present Illness: Kathryn Lucas is a 61 y.o. female with PMH of left breast cancer, hypothyroidism, and fracture of left patella on 12/24/21 being seen today for image-guided sacral bone biopsy. The patient has a known history of breast cancer, and CT lumbar spine and myelogram performed 12/18/21 revealed concern for multiple lytic lesions concerning for metastasis. Unrelated The patient was referred to interventional radiology at this time for biopsy of one of these lesions.   Past Medical History:  Diagnosis Date   Breast cancer ([place])    left breast cancer   Cancer ([place]) 09/2017   left breast cancer   Family history of breast cancer    Hypothyroidism    Personal history of radiation therapy 2019   Thyroid disease     Past Surgical History:  Procedure Laterality Date   BREAST BIOPSY Left 2018   BREAST BIOPSY Left 2019   BREAST RECONSTRUCTION WITH PLACEMENT OF TISSUE EXPANDER AND ALLODERM Left 10/26/2017   Procedure: LEFT BREAST RECONSTRUCTION WITH PLACEMENT OF TISSUE EXPANDER AND ALLODERM;  Surgeon: Irene Limbo, MD;  Location: [facility];  Service: Plastics;  Laterality: Left;   MASTECTOMY Left 2019   MASTECTOMY WITH RADIOACTIVE SEED GUIDED EXCISION AND AXILLARY SENTINEL LYMPH NODE BIOPSY Left 10/26/2017   Procedure: LEFT MASTECTOMY WITH SEED TARGETED  LEFT AXILLARY LYMPH NODE EXCISION AND LEFT SENTINEL LYMPH NODE BIOPSY;  Surgeon: Stark Klein, MD;  Location: [facility];  Service: General;  Laterality: Left;   TONSILLECTOMY     WISDOM TOOTH EXTRACTION      Allergies: Percocet [oxycodone-acetaminophen]  Medications: Prior to Admission medications   Medication Sig Start Date End Date Taking? Authorizing  Provider  anastrozole (ARIMIDEX) 1 MG tablet TAKE ONE TABLET BY MOUTH DAILY 12/18/21  Yes Nicholas Lose, MD  Calcium 500-100 MG-UNIT CHEW Chew 1 tablet by mouth daily. 12/08/19  Yes Nicholas Lose, MD  cholecalciferol (VITAMIN D3) 25 MCG (1000 UNIT) tablet Take 1 tablet (1,000 Units total) by mouth daily. 12/08/19  Yes Nicholas Lose, MD  gabapentin (NEURONTIN) 300 MG capsule Take 300 mg by mouth 3 (three) times daily.   Yes [provider]  glycerin adult 2 g suppository Place 1 suppository rectally as needed for constipation. 12/24/21  Yes Nicholas Lose, MD  levothyroxine (SYNTHROID, LEVOTHROID) 175 MCG tablet Take 112 mcg by mouth.    Yes [provider]  ondansetron (ZOFRAN) 8 MG tablet Take 1 tablet (8 mg total) by mouth every 8 (eight) hours as needed for nausea. 12/23/21  Yes Nicholas Lose, MD  vitamin C (ASCORBIC ACID) 250 MG tablet Take 1 tablet (250 mg total) by mouth daily. 12/08/19  Yes Nicholas Lose, MD  zinc gluconate 50 MG tablet Take 1 tablet (50 mg total) by mouth daily. 12/08/19  Yes Nicholas Lose, MD  meloxicam (MOBIC) 15 MG tablet Take 1 tablet (15 mg total) by mouth daily. Patient not taking: Reported on 12/26/2021 01/13/21   Glennon Mac, DO  oxyCODONE-acetaminophen (PERCOCET/ROXICET) 5-325 MG tablet Take 1 tablet by mouth every 4 (four) hours as needed for severe pain. Patient not taking: Reported on 12/26/2021 12/19/21   Nicholas Lose, MD  palbociclib [facility]) 100 MG capsule Take 1 capsule (100 mg total) by mouth daily with breakfast. Take whole with food. Take for 21 days  on, 7 days off, repeat every 28 days. Patient not taking: Reported on 12/26/2021 12/22/21   Nicholas Lose, MD  promethazine (PHENERGAN) 25 MG suppository Place 1 suppository (25 mg total) rectally every 6 (six) hours as needed for nausea or vomiting. 12/24/21   Nicholas Lose, MD     Family History  Problem Relation Age of Onset   Breast cancer Mother 54   Stroke Sister        thought to  be due to tamoxifen use   Dementia Maternal Grandmother    COPD Paternal Grandfather     Social History   Socioeconomic History   Marital status: Single    Spouse name: Not on file   Number of children: Not on file   Years of education: Not on file   Highest education level: Not on file  Occupational History   Not on file  Tobacco Use   Smoking status: Never   Smokeless tobacco: Never  Vaping Use   Vaping Use: Never used  Substance and Sexual Activity   Alcohol use: Yes    Alcohol/week: 0.0 standard drinks of alcohol    Comment: social   Drug use: Never   Sexual activity: Not on file  Other Topics Concern   Not on file  Social History Narrative   Lives alone and one dog.   Social Determinants of Health   Financial Resource Strain: Not on file  Food Insecurity: Not on file  Transportation Needs: Not on file  Physical Activity: Not on file  Stress: Not on file  Social Connections: Not on file    Review of Systems: A 12 point ROS discussed and pertinent positives are indicated in the HPI above.  All other systems are negative.  Review of Systems  Constitutional:  Negative for chills and fever.  Respiratory:  Negative for chest tightness and shortness of breath.   Cardiovascular:  Negative for chest pain and leg swelling.  Gastrointestinal:  Negative for abdominal pain, diarrhea, nausea and vomiting.  Musculoskeletal:  Positive for back pain and joint swelling.       Patient reports recent fracture of left patella  Neurological:  Positive for headaches. Negative for dizziness and light-headedness.  Psychiatric/Behavioral:  Negative for confusion.     Vital Signs: BP 115/61 (BP Location: Left Arm)   Pulse 93   Temp 97.9 F (36.6 C) (Oral)   Resp 18   Ht '5\' 10"'$  (1.778 m)   Wt 153 lb (69.4 kg)   SpO2 97%   BMI 21.95 kg/m     Physical Exam Vitals reviewed.  Constitutional:      General: She is not in acute distress. HENT:     Mouth/Throat:     Mouth:  Mucous membranes are moist.  Cardiovascular:     Rate and Rhythm: Normal rate and regular rhythm.     Pulses: Normal pulses.     Heart sounds: Normal heart sounds.  Pulmonary:     Effort: Pulmonary effort is normal.     Breath sounds: Normal breath sounds.  Abdominal:     General: Abdomen is flat. Bowel sounds are normal.     Palpations: Abdomen is soft.     Tenderness: There is no abdominal tenderness.  Musculoskeletal:        General: Tenderness present.     Right lower leg: No edema.     Left lower leg: No edema.     Comments: Patient with tenderness and pain of left knee with any  movement  Skin:    General: Skin is warm and dry.  Neurological:     Mental Status: She is alert and oriented to person, place, and time.  Psychiatric:        Mood and Affect: Mood normal.        Behavior: Behavior normal.        Thought Content: Thought content normal.        Judgment: Judgment normal.     Imaging: CT BONE TROCAR/NEEDLE BIOPSY DEEP  Result Date: 12/26/2021 CLINICAL DATA:  Breast carcinoma. Multiple osseous lesions including expansile sacral lesion. EXAM: CT GUIDED CORE BONE BIOPSY OF SACRAL LESION ANESTHESIA/SEDATION: Intravenous Fentanyl 163mg and Versed '2mg'$  were administered as conscious sedation during continuous monitoring of the patient's level of consciousness and physiological / cardiorespiratory status by the radiology RN, with a total moderate sedation time of 11 minutes. PROCEDURE: The procedure risks, benefits, and alternatives were explained to the patient. Questions regarding the procedure were encouraged and answered. The patient understands and consents to the procedure. patient placed right lateral decubitus. select axial scans through the sacrum were obtained. the dominant lesion was localized and appropriate skin entry site was determined and marked. The operative field was prepped with chlorhexidinein a sterile fashion, and a sterile drape was applied covering the  operative field. A sterile gown and sterile gloves were used for the procedure. Local anesthesia was provided with 1% Lidocaine. A 11 gauge cook bone needle was advanced to the margin of the lesion. Coaxial bone biopsy samples were obtained. A final core biopsy obtained through the guide needle itself, which was then removed. Sample sent in formalin to surgical pathology. Postprocedure scans show no immediate complication. The patient tolerated the procedure well. RADIATION DOSE REDUCTION: This exam was performed according to the departmental dose-optimization program which includes automated exposure control, adjustment of the mA and/or kV according to patient size and/or use of iterative reconstruction technique. COMPLICATIONS: None immediate FINDINGS: Lytic expansile lower sacral lesion was localized. Representative core bone lesion biopsies obtained as above. IMPRESSION: Technically successful CT-guided core bone biopsy, expansile sacral bone lesion. Electronically Signed   By: DLucrezia EuropeM.D.   On: 12/26/2021 15:12   UKoreaAbdomen Limited RUQ (LIVER/GB)  Result Date: 12/25/2021 CLINICAL DATA:  Elevated LFTs EXAM: ULTRASOUND ABDOMEN LIMITED RIGHT UPPER QUADRANT COMPARISON:  None Available. FINDINGS: Gallbladder: No gallstones or wall thickening visualized. No sonographic Murphy sign noted by sonographer. Common bile duct: Diameter: 2 mm Liver: Within the right hepatic lobe there is a 3.2 x 2.4 x 3.3 cm complex cystic structure with suggestion of internal nodularity and septations. Mild increased hepatic parenchymal echogenicity. Portal vein is patent on color Doppler imaging with normal direction of blood flow towards the liver. Other: None. IMPRESSION: 1. There is a complex cystic structure within the right hepatic lobe measuring up to 3.3 cm. Recommend further evaluation with pre and post contrast-enhanced abdominal MRI. 2. Mild increased hepatic parenchymal echogenicity. Findings are nonspecific but can be  seen in the setting of hepatic steatosis. 3. No cholelithiasis or sonographic evidence for acute cholecystitis. Electronically Signed   By: DLovey NewcomerM.D.   On: 12/25/2021 05:48   DG Knee 4 Views W/Patella Left  Result Date: 12/24/2021 CLINICAL DATA:  Left knee pain after fall EXAM: LEFT KNEE - COMPLETE 5 VIEW COMPARISON:  None Available. FINDINGS: Acute, transversely oriented fracture through the patella, with mild displacement. No additional fracture is seen in the knee. Mild degenerative changes. Small joint effusion. IMPRESSION:  Acute patellar fracture. Electronically Signed   By: Merilyn Baba M.D.   On: 12/24/2021 11:19   DG MYELOGRAPHY LUMBAR INJ LUMBOSACRAL  Result Date: 12/18/2021 CLINICAL DATA:  Low back pain extending into the lower extremities bilaterally. EXAM: LUMBAR MYELOGRAM FLUOROSCOPY: dictate in minutes and seconds PROCEDURE: After thorough discussion of risks and benefits of the procedure including bleeding, infection, injury to nerves, blood vessels, adjacent structures as well as headache and CSF leak, written and oral informed consent was obtained. Consent was obtained by Dr. San Morelle. Time out form was completed. Patient was positioned prone on the fluoroscopy table. Local anesthesia was provided with 1% lidocaine without epinephrine after prepped and draped in the usual sterile fashion. Puncture was performed at L2-3 using a 3 1/2 inch 22-gauge spinal needle via a left paramedian approach. Using a single pass through the dura, the needle was placed within the thecal sac, with return of clear CSF. 15 mL of Isovue M-200 was injected into the thecal sac, with normal opacification of the nerve roots and cauda equina consistent with free flow within the subarachnoid space. I personally performed the lumbar puncture and administered the intrathecal contrast. I also personally supervised acquisition of the myelogram images. TECHNIQUE: Contiguous axial images were obtained  through the Lumbar spine after the intrathecal infusion of infusion. Coronal and sagittal reconstructions were obtained of the axial image sets. COMPARISON:  None Available. FINDINGS: LUMBAR MYELOGRAM FINDINGS: Slight retrolisthesis is present at L3-4 and L4-5 alignment is not changed significantly with flexion or extension. No other significant listhesis is present. Mild rightward curvature is present at L4-5. Leftward curvature is centered at L2. Subarticular narrowing at L1-2 is worse on the right. Subarticular narrowing is worse on the left at L2-3. Broad-based disc protrusion is present with right greater than left subarticular narrowing at L3-4. The nerve roots fill normally on both sides. CT LUMBAR MYELOGRAM FINDINGS: The lumbar spine is imaged the midbody of T12 through the mid sacrum. An expansile lytic lesion of the sacrum measures at least 7.3 x 3.6 cm. Extraosseous extension of tumor is present. Diffuse lytic lesions are present throughout the visualized spine. Prominent lytic lesions are present within the spinous process of L2 and L3. No pathologic fracture is present. Lytic lesions are present within the left pedicle at L4 and L5. Limited imaging the abdomen is unremarkable. There is no significant adenopathy. No solid organ lesions are present. T12-L1: A shallow central disc protrusion is present without significant stenosis. L1-2: No significant disc protrusion or stenosis is present. L2-3: Sclerotic endplate changes are degenerative. Chronic loss of disc height is present. Leftward endplate osteophytes contribute 2 mild left foraminal stenosis. L3-4: An expansile lytic lesion is present within the right lamina. Adjacent ligamentum flavum thickening is present. Lytic lesions are present within the pedicles bilaterally. Mild bilateral facet hypertrophy is present. A broad-based disc bulge is present. This results in mild central and right foraminal narrowing. L4-5: Mild disc bulging and facet  hypertrophy is present without significant stenosis. L5-S1: Mild disc bulging and facet hypertrophy is present. No significant stenosis is present. IMPRESSION: 1. Large expansile lytic lesion of the sacrum with extraosseous extension of tumor. 2. Diffuse lytic lesions throughout the visualized spine. This likely represents metastatic disease from the patient's known breast cancer. A second malignancy or myeloma is considered less likely. 3. No pathologic fracture. 4. Mild left foraminal stenosis at L2-3. 5. Mild central and right foraminal stenosis at L3-4. 6. Mild disc bulging and facet hypertrophy at L4-5  and L5-S1 without significant stenosis. 7. Mild rightward curvature of the lumbar spine is centered at L4-5. 8. Subarticular narrowing at L1-2 is worse on the right. Electronically Signed   By: San Morelle M.D.   On: 12/18/2021 14:21   CT LUMBAR SPINE W CONTRAST  Result Date: 12/18/2021 CLINICAL DATA:  Low back pain extending into the lower extremities bilaterally. EXAM: LUMBAR MYELOGRAM FLUOROSCOPY: dictate in minutes and seconds PROCEDURE: After thorough discussion of risks and benefits of the procedure including bleeding, infection, injury to nerves, blood vessels, adjacent structures as well as headache and CSF leak, written and oral informed consent was obtained. Consent was obtained by Dr. San Morelle. Time out form was completed. Patient was positioned prone on the fluoroscopy table. Local anesthesia was provided with 1% lidocaine without epinephrine after prepped and draped in the usual sterile fashion. Puncture was performed at L2-3 using a 3 1/2 inch 22-gauge spinal needle via a left paramedian approach. Using a single pass through the dura, the needle was placed within the thecal sac, with return of clear CSF. 15 mL of Isovue M-200 was injected into the thecal sac, with normal opacification of the nerve roots and cauda equina consistent with free flow within the subarachnoid space.  I personally performed the lumbar puncture and administered the intrathecal contrast. I also personally supervised acquisition of the myelogram images. TECHNIQUE: Contiguous axial images were obtained through the Lumbar spine after the intrathecal infusion of infusion. Coronal and sagittal reconstructions were obtained of the axial image sets. COMPARISON:  None Available. FINDINGS: LUMBAR MYELOGRAM FINDINGS: Slight retrolisthesis is present at L3-4 and L4-5 alignment is not changed significantly with flexion or extension. No other significant listhesis is present. Mild rightward curvature is present at L4-5. Leftward curvature is centered at L2. Subarticular narrowing at L1-2 is worse on the right. Subarticular narrowing is worse on the left at L2-3. Broad-based disc protrusion is present with right greater than left subarticular narrowing at L3-4. The nerve roots fill normally on both sides. CT LUMBAR MYELOGRAM FINDINGS: The lumbar spine is imaged the midbody of T12 through the mid sacrum. An expansile lytic lesion of the sacrum measures at least 7.3 x 3.6 cm. Extraosseous extension of tumor is present. Diffuse lytic lesions are present throughout the visualized spine. Prominent lytic lesions are present within the spinous process of L2 and L3. No pathologic fracture is present. Lytic lesions are present within the left pedicle at L4 and L5. Limited imaging the abdomen is unremarkable. There is no significant adenopathy. No solid organ lesions are present. T12-L1: A shallow central disc protrusion is present without significant stenosis. L1-2: No significant disc protrusion or stenosis is present. L2-3: Sclerotic endplate changes are degenerative. Chronic loss of disc height is present. Leftward endplate osteophytes contribute 2 mild left foraminal stenosis. L3-4: An expansile lytic lesion is present within the right lamina. Adjacent ligamentum flavum thickening is present. Lytic lesions are present within the  pedicles bilaterally. Mild bilateral facet hypertrophy is present. A broad-based disc bulge is present. This results in mild central and right foraminal narrowing. L4-5: Mild disc bulging and facet hypertrophy is present without significant stenosis. L5-S1: Mild disc bulging and facet hypertrophy is present. No significant stenosis is present. IMPRESSION: 1. Large expansile lytic lesion of the sacrum with extraosseous extension of tumor. 2. Diffuse lytic lesions throughout the visualized spine. This likely represents metastatic disease from the patient's known breast cancer. A second malignancy or myeloma is considered less likely. 3. No pathologic fracture. 4.  Mild left foraminal stenosis at L2-3. 5. Mild central and right foraminal stenosis at L3-4. 6. Mild disc bulging and facet hypertrophy at L4-5 and L5-S1 without significant stenosis. 7. Mild rightward curvature of the lumbar spine is centered at L4-5. 8. Subarticular narrowing at L1-2 is worse on the right. Electronically Signed   By: San Morelle M.D.   On: 12/18/2021 14:21   MM 3D SCREEN BREAST BILATERAL  Result Date: 12/12/2021 CLINICAL DATA:  Screening. EXAM: DIGITAL SCREENING BILATERAL MAMMOGRAM WITH TOMOSYNTHESIS AND CAD TECHNIQUE: Bilateral screening digital craniocaudal and mediolateral oblique mammograms were obtained. Bilateral screening digital breast tomosynthesis was performed. The images were evaluated with computer-aided detection. COMPARISON:  Previous exam(s). ACR Breast Density Category b: There are scattered areas of fibroglandular density. FINDINGS: There are no findings suspicious for malignancy. IMPRESSION: No mammographic evidence of malignancy. A result letter of this screening mammogram will be mailed directly to the patient. RECOMMENDATION: Screening mammogram in one year. (Code:SM-B-01Y) BI-RADS CATEGORY  1: Negative. Electronically Signed   By: Franki Cabot M.D.   On: 12/12/2021 15:15    Labs:  CBC: Recent Labs     04/19/21 1808 08/07/21 1622 12/25/21 1410  WBC 5.9 6.6 5.2  HGB 14.6 14.1 12.5  HCT 43.7 43.5 36.9  PLT 192 203 208    COAGS: No results for input(s): "INR", "APTT" in the last 8760 hours.  BMP: Recent Labs    04/19/21 1808 08/07/21 1622 12/25/21 1410  NA 134* 135 134*  K 3.6 4.2 3.4*  CL 101 102 96*  CO2 '27 27 29  '$ GLUCOSE 128* 92 147*  BUN 24* 18 17  CALCIUM 9.1 9.0 11.6*  CREATININE 0.93 0.91 0.96  GFRNONAA >60 >60 >60    LIVER FUNCTION TESTS: Recent Labs    12/25/21 1410  BILITOT 0.9  AST 236*  ALT 112*  ALKPHOS 131*  PROT 7.2  ALBUMIN 4.0    TUMOR MARKERS: No results for input(s): "AFPTM", "CEA", "CA199", "CHROMGRNA" in the last 8760 hours.  Assessment and Plan:  Shaquayla Klimas is a 61 yo female with PMH of breast cancer, hypothyroidism, and recent fracture of left patella being seen today for image-guided sacral bone biopsy. CT on 10/5 revealed multiple lytic lesions concerning for metastasis of existing malignancy. IR was consulted for biopsy of one of these lesions. The case has been reviewed and approved for image-guided sacral bone biopsy with Dr Vernard Gambles on 12/26/21.   Risks and benefits of image-guided sacral bone biopsy was discussed with the patient and patient's family including, but not limited to bleeding, infection, damage to adjacent structures or low yield requiring additional tests.  All of the questions were answered and there is agreement to proceed.  Consent signed and in chart.   Thank you for this interesting consult.  I greatly enjoyed meeting Kathryn Lucas and look forward to participating in their care.  A copy of this report was sent to the requesting provider on this date.  Electronically Signed: Lura Em, PA-C 12/26/2021, 4:37 PM   I spent a total of  30 Minutes   in face to face in clinical consultation, greater than 50% of which was counseling/coordinating care for sacral bone biopsy.

## 2021-12-26 NOTE — Progress Notes (Signed)
COMMUNITY PALLIATIVE CARE SW NOTE  PATIENT NAME: Kathryn Lucas DOB: 01-14-1961 MRN: [id number]  PRIMARY CARE PROVIDER: [facility]  RESPONSIBLE PARTY:  Acct ID - Guarantor Home Phone Work Phone Relationship Acct Type  [id number] TYRESA, PRINDIVILLE* [phone]  Self P/F     [address] [place], [place] [address]   SOCIAL WORK TELEPHONIC VISIT  PC SW completed a telephonic follow-up per patient's request. SW introduced herself and extended support to her. Patient advised that she thinks she fractured her knee and needed a walker. SW asked her if she is receiving any therapies or if she has seen a doctor about this. She stated "no, this just happened". She advised that she has an appointment with the orthopedic ([facility]) doctor on Saturday. 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 She advised  that she is trying to get her sister to move in with her to help her out, but was not sure if she would move-in. She stated that she does need assistance with cleaning. SW confirmed her insurance [facility]). SW advised that any in-home assistance will have to be an out-of-pocket expense for her. SW also advised that he should wait until she see the orthopedic doctor to discuss a walker or what therapies are recommended.  SW reinforced ongoing support and encouraged her to call with any questions or concerns.     [address] [place], [place]

## 2021-12-26 NOTE — Procedures (Signed)
  Procedure:  CT sacral bone lesion core biopsy 11g Preprocedure diagnosis: Diagnoses of Malignant neoplasm of lower-outer quadrant of left breast of female, estrogen receptor positive ([place]) and Metastatic malignant neoplasm, unspecified site [facility]) were pertinent to this visit.  Postprocedure diagnosis: same EBL:    minimal Complications:   none immediate  See full dictation in [facility].  Dillard Cannon MD Main # [phone] Pager  [phone] Mobile [phone]

## 2021-12-26 NOTE — Progress Notes (Signed)
Patient clinically stable post Sacral bone biopsy per Dr Vernard Gambles, tolerated well with stable vitals. Received Versed 2 mg along with Fentanyl 100 mcg IV for procedure. Report given to [employer] post procedure/bedside.

## 2021-12-29 ENCOUNTER — Other Ambulatory Visit (HOSPITAL_COMMUNITY): Payer: Self-pay

## 2021-12-29 ENCOUNTER — Encounter: Payer: Self-pay | Admitting: Hematology and Oncology

## 2021-12-29 NOTE — Progress Notes (Signed)
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  She presented with low back pain that appeared like a pinched nerve over the past several months.  Location(s) of Symptomatic Metastases: Sacrum,    PET 12/25/2021: Widespread osseous metastatic disease with the largest lytic lesion involving the sacrum. There are pathologic fractures at T6 and T10.  Small hypermetabolic lymph nodes in the retroperitoneum and pelvis consistent with nodal metastases. Single small hypermetabolic right axillary lymph node.  Focal hypermetabolic activity in the head of the pancreas without CT correlate. This could reflect a metastasis, adjacent lymph node or incidental pancreatic lesion. Recommend attention on follow-up.  CT L Spine 12/18/2021:  Large expansile lytic lesion of the sacrum with extraosseous extension of tumor.  Diffuse lytic lesions throughout the visualized spine. This likely represents metastatic disease from the patient's known breast cancer. A second malignancy or myeloma is considered less likely.  No pathologic fracture.  CT Myelogram 12/18/2021: Large expansile lesion in the sacrum with extraosseous extension of the tumor, diffuse lytic lesions throughout the visualized spine with metastatic disease myeloma is considered less likely.   Past/Anticipated chemotherapy by medical oncology, if any:  Dr. Lindi Adie 12/19/2021 -Treatment plan: 1.  PET CT scan 2. biopsy of sacrum: I discussed with Dr. Vernard Gambles who agreed to get the sacral biopsy done. 3.  Systemic therapy based upon the biopsy results.  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  She will start Faslodex next week. 4.  Xgeva for bone metastases. 5.  Palliative radiation to the sacrum 12/22/2021 -Patient's insurance does not cover Verzinio.  The preferred medication is Ibrance.  -So I sent a new prescription for Ibrance 100 mg a  day starting dose.  She gets it 3 weeks on 1 week off schedule.    Pain on a scale of 0-10 is:     If Spine Met(s), symptoms, if any, include: Bowel/Bladder retention or incontinence (please describe):  Numbness or weakness in extremities (please describe):  Current Decadron regimen, if applicable: n/a   Ambulatory status? Walker? Wheelchair?:    SAFETY ISSUES: Prior radiation? Left Breast, post mastectomy radiation 9/25-11/13/2019 Pacemaker/ICD?  Possible current pregnancy? Postmenopausal Is the patient on methotrexate?   Current Complaints / other details:   Treatment plan: 1. Antiestrogen therapy with anastrozole 1 mg daily started 07/15/2016 2. Mastectomy 10/26/2017, Mammaprint low risk luminal type A 3. Followed by adjuvant radiation 12/08/17- 01/26/18  4. Followed by adjuvant antiestrogen therapy anastrozole started 01/17/2018 (originally started 07/15/2016)

## 2021-12-29 NOTE — Progress Notes (Signed)
Radiation Oncology         ([phone]) [phone] ________________________________  Name: [name]        MRN: [id number]  Date of Service: 12/30/2021 DOB: 1960/07/04  CC:[facility], [name]  Nicholas Lose, MD     REFERRING PHYSICIAN: Nicholas Lose, MD   DIAGNOSIS: The primary encounter diagnosis was Malignant neoplasm of lower-outer quadrant of left breast of female, estrogen receptor positive ([place]). A diagnosis of Malignant neoplasm metastatic to bone [facility]) was also pertinent to this visit.   HISTORY OF PRESENT ILLNESS: Kathryn Lucas is a 61 y.o. female seen at the request of Dr. Lindi Adie for a history of Stage IIA, cT3N0M0, pT3N1aM0, grade 1, ER/PR positive, invasive ductal carcinoma of the left breast. She had mastectomy with a low risk mammaprint. She received adjuvant radiotherapy to the left chest wall and regional nodes. She has been receiving adjuvant antiestrogen therapy.   She recently developed low back pain and recent imaging August 2023 showed an expansile lesion in the sacrum. She had a PET scan that showed hypermetabolic change in her retroperitoneal nodes and in the T6 and T10 spine as well as fracturing at these sites. There was also an expansile lesion in the sacrum as well. A biopsy of the sacral lesion on 12/26/21 showed metastatic carcinoma consistent with breast primary. She's recently fractured her left patella as well based on imaging on 12/24/21 with plain films after a fall. She's seen to discuss palliative radiotherapy to the sacrum.    PREVIOUS RADIATION THERAPY:   12/08/2017 - 01/26/2018  Site/dose:   The patient initially received a dose of 50.4 Gy in 28 fractions to the left chest wall and supraclavicular region. This was delivered using a 3-D conformal, 4 field technique. The patient then received a boost to the mastectomy scar. This delivered an additional 10 Gy in 5 fractions using an en face electron field. The total dose was 60.4 Gy.   PAST  MEDICAL HISTORY:  Past Medical History:  Diagnosis Date   Breast cancer ([place])    left breast cancer   Cancer ([place]) 09/2017   left breast cancer   Family history of breast cancer    Hypothyroidism    Personal history of radiation therapy 2019   Thyroid disease        PAST SURGICAL HISTORY: Past Surgical History:  Procedure Laterality Date   BREAST BIOPSY Left 2018   BREAST BIOPSY Left 2019   BREAST RECONSTRUCTION WITH PLACEMENT OF TISSUE EXPANDER AND ALLODERM Left 10/26/2017   Procedure: LEFT BREAST RECONSTRUCTION WITH PLACEMENT OF TISSUE EXPANDER AND ALLODERM;  Surgeon: Irene Limbo, MD;  Location: [place];  Service: Plastics;  Laterality: Left;   MASTECTOMY Left 2019   MASTECTOMY WITH RADIOACTIVE SEED GUIDED EXCISION AND AXILLARY SENTINEL LYMPH NODE BIOPSY Left 10/26/2017   Procedure: LEFT MASTECTOMY WITH SEED TARGETED  LEFT AXILLARY LYMPH NODE EXCISION AND LEFT SENTINEL LYMPH NODE BIOPSY;  Surgeon: Stark Klein, MD;  Location: [place];  Service: General;  Laterality: Left;   TONSILLECTOMY     WISDOM TOOTH EXTRACTION       FAMILY HISTORY:  Family History  Problem Relation Age of Onset   Breast cancer Mother 44   Stroke Sister        thought to be due to tamoxifen use   Dementia Maternal Grandmother    COPD Paternal Grandfather      SOCIAL HISTORY:  reports that she has never smoked. She has  never used smokeless tobacco. She reports current alcohol use. She reports that she does not use drugs. The patient is single and lives in [place]. She's accompanied by a friend.    ALLERGIES: Percocet [oxycodone-acetaminophen]   MEDICATIONS:  Current Outpatient Medications  Medication Sig Dispense Refill   anastrozole (ARIMIDEX) 1 MG tablet TAKE ONE TABLET BY MOUTH DAILY 90 tablet 3   Calcium 500-100 MG-UNIT CHEW Chew 1 tablet by mouth daily. 60 tablet    cholecalciferol (VITAMIN D3) 25 MCG (1000 UNIT) tablet Take 1 tablet (1,000 Units  total) by mouth daily.     gabapentin (NEURONTIN) 300 MG capsule Take 300 mg by mouth 3 (three) times daily.     glycerin adult 2 g suppository Place 1 suppository rectally as needed for constipation. 12 suppository 0   levothyroxine (SYNTHROID, LEVOTHROID) 175 MCG tablet Take 112 mcg by mouth.      ondansetron (ZOFRAN) 8 MG tablet Take 1 tablet (8 mg total) by mouth every 8 (eight) hours as needed for nausea. 30 tablet 3   promethazine (PHENERGAN) 25 MG suppository Place 1 suppository (25 mg total) rectally every 6 (six) hours as needed for nausea or vomiting. 12 each 0   vitamin C (ASCORBIC ACID) 250 MG tablet Take 1 tablet (250 mg total) by mouth daily.     zinc gluconate 50 MG tablet Take 1 tablet (50 mg total) by mouth daily.     palbociclib (IBRANCE) 100 MG capsule Take 1 capsule (100 mg total) by mouth daily with breakfast. Take whole with food. Take for 21 days on, 7 days off, repeat every 28 days. (Patient not taking: Reported on 12/26/2021) 21 capsule 3   No current facility-administered medications for this encounter.     REVIEW OF SYSTEMS: On review of systems, the patient reports that she is really struggling with pain in her low back. She reports pain her left knee as well following her fracture. She denies any pain in her upper or mid back at this time. She describes pain though in her low back being the most difficult. She denies loss of bowel or bladder control, lost of sensation along the perineum, or foot drop. She struggles to get rest at night but does not want to use narcotic pain medication. She's currently taking gabapentin with some relief. No other complaints are noted.      PHYSICAL EXAM:  Wt Readings from Last 3 Encounters:  12/26/21 153 lb (69.4 kg)  12/19/21 153 lb 14.4 oz (69.8 kg)  08/07/21 174 lb 2.6 oz (79 kg)   Temp Readings from Last 3 Encounters:  12/30/21 (!) 97.4 F (36.3 C) (Temporal)  12/26/21 97.9 F (36.6 C) (Oral)  12/25/21 98.6 F (37 C)    BP Readings from Last 3 Encounters:  12/30/21 (!) 124/49  12/26/21 115/61  12/25/21 109/70   Pulse Readings from Last 3 Encounters:  12/30/21 90  12/26/21 93  12/25/21 (!) 103   Pain Assessment Pain Score: 6 /10  In general this is a fatigued, caucasian woman grimacing in pain, but in no cardiopulmonary distress. She's alert and oriented x4 and appropriate throughout the examination. Cardiopulmonary assessment is negative for acute distress and she exhibits normal effort.     ECOG = 1  0 - Asymptomatic (Fully active, able to carry on all predisease activities without restriction)  1 - Symptomatic but completely ambulatory (Restricted in physically strenuous activity but ambulatory and able to carry out work of a light or sedentary nature. For  example, light housework, office work)  2 - Symptomatic, <50% in bed during the day (Ambulatory and capable of all self care but unable to carry out any work activities. Up and about more than 50% of waking hours)  3 - Symptomatic, >50% in bed, but not bedbound (Capable of only limited self-care, confined to bed or chair 50% or more of waking hours)  4 - Bedbound (Completely disabled. Cannot carry on any self-care. Totally confined to bed or chair)  5 - Death   Eustace Pen MM, Creech RH, Tormey DC, et al. [phone]). "Toxicity and response criteria of the [employer]". [place] Oncol. 5 (6): 649-55    LABORATORY DATA:  Lab Results  Component Value Date   WBC 5.2 12/25/2021   HGB 12.5 12/25/2021   HCT 36.9 12/25/2021   MCV 87.2 12/25/2021   PLT 208 12/25/2021   Lab Results  Component Value Date   NA 134 (L) 12/25/2021   K 3.4 (L) 12/25/2021   CL 96 (L) 12/25/2021   CO2 29 12/25/2021   Lab Results  Component Value Date   ALT 112 (H) 12/25/2021   AST 236 (HH) 12/25/2021   ALKPHOS 131 (H) 12/25/2021   BILITOT 0.9 12/25/2021      RADIOGRAPHY: NM PET Image Initial (PI) Skull Base To Thigh  Result Date:  12/27/2021 CLINICAL DATA:  Initial treatment strategy for stage IV breast cancer. Lytic osseous lesions in the sacrum and lumbar spine on CT. EXAM: NUCLEAR MEDICINE PET SKULL BASE TO THIGH TECHNIQUE: 7.63 mCi F-18 FDG was injected intravenously. Full-ring PET imaging was performed from the skull base to thigh after the radiotracer. CT data was obtained and used for attenuation correction and anatomic localization. Fasting blood glucose: 91 mg/dl COMPARISON:  CT lumbar spine 12/18/2021 FINDINGS: Mediastinal blood pool activity: SUV max 2.5 NECK: No hypermetabolic cervical lymph nodes are identified.Fairly symmetric activity within the lymphoid tissue of Waldeyer's ring is within physiologic limits.No suspicious activity identified within the pharyngeal mucosal space. There is focal hypermetabolic activity within the right thyroid lobe (SUV max 7.8). A discrete nodule or enlarged lymph node is not visualized in this area on the CT images. Incidental CT findings: none CHEST: 8 mm right axillary node on image 65/4 demonstrates mild hypermetabolic activity (SUV max 4.9). No other hypermetabolic mediastinal, hilar, axillary or internal mammary lymph nodes are seen. No hypermetabolic pulmonary activity or suspicious nodularity. Incidental CT findings: Dependent atelectasis or scarring at both lung bases without hypermetabolic activity. ABDOMEN/PELVIS: Focal hypermetabolic activity in the head of the pancreas is without CT correlate (SUV max 6.8). There is mild hypermetabolic activity within both adrenal glands (SUV max 4.4 on the right and 5.5 on the left). No hypermetabolic activity within the liver or spleen. There are small hypermetabolic retroperitoneal and pelvic lymph nodes. Representative nodes include a 9 mm right common iliac node on image 143/4 (SUV max 8.3) and a 1.2 cm right external iliac node on image 173/4 (SUV max 10.2). Incidental CT findings: Dominant cyst in the left hepatic lobe measuring up to 3.7 cm,  without hypermetabolic activity. Minimal aortic atherosclerosis. SKELETON: Widespread osseous metastatic disease with involvement of the calvarium, spine, sternum, multiple ribs, the pelvis and the proximal appendicular skeleton. The large lytic mass involving the sacrum has an SUV max of 10.7. There are pathologic fractures at T6 and T10. No gross epidural tumor. Incidental CT findings: Left breast implant. IMPRESSION: 1. Widespread osseous metastatic disease with the largest lytic lesion involving the  sacrum. There are pathologic fractures at T6 and T10. 2. Small hypermetabolic lymph nodes in the retroperitoneum and pelvis consistent with nodal metastases. Single small hypermetabolic right axillary lymph node. 3. Focal hypermetabolic activity in the head of the pancreas without CT correlate. This could reflect a metastasis, adjacent lymph node or incidental pancreatic lesion. Recommend attention on follow-up. 4. Indeterminate hypermetabolic activity within both adrenal glands without CT correlate, likely physiologic. 5. Hypermetabolic right thyroid lobe nodule versus adjacent lymph node. Given the patient's widespread osseous metastatic disease, this is unlikely to be clinically significant. Attention on follow-up recommended. Electronically Signed   By: Richardean Sale M.D.   On: 12/27/2021 11:53   CT BONE TROCAR/NEEDLE BIOPSY DEEP  Result Date: 12/26/2021 CLINICAL DATA:  Breast carcinoma. Multiple osseous lesions including expansile sacral lesion. EXAM: CT GUIDED CORE BONE BIOPSY OF SACRAL LESION ANESTHESIA/SEDATION: Intravenous Fentanyl 168mg and Versed '2mg'$  were administered as conscious sedation during continuous monitoring of the patient's level of consciousness and physiological / cardiorespiratory status by the radiology RN, with a total moderate sedation time of 11 minutes. PROCEDURE: The procedure risks, benefits, and alternatives were explained to the patient. Questions regarding the procedure were  encouraged and answered. The patient understands and consents to the procedure. patient placed right lateral decubitus. select axial scans through the sacrum were obtained. the dominant lesion was localized and appropriate skin entry site was determined and marked. The operative field was prepped with chlorhexidinein a sterile fashion, and a sterile drape was applied covering the operative field. A sterile gown and sterile gloves were used for the procedure. Local anesthesia was provided with 1% Lidocaine. A 11 gauge cook bone needle was advanced to the margin of the lesion. Coaxial bone biopsy samples were obtained. A final core biopsy obtained through the guide needle itself, which was then removed. Sample sent in formalin to surgical pathology. Postprocedure scans show no immediate complication. The patient tolerated the procedure well. RADIATION DOSE REDUCTION: This exam was performed according to the departmental dose-optimization program which includes automated exposure control, adjustment of the mA and/or kV according to patient size and/or use of iterative reconstruction technique. COMPLICATIONS: None immediate FINDINGS: Lytic expansile lower sacral lesion was localized. Representative core bone lesion biopsies obtained as above. IMPRESSION: Technically successful CT-guided core bone biopsy, expansile sacral bone lesion. Electronically Signed   By: DLucrezia EuropeM.D.   On: 12/26/2021 15:12   UKoreaAbdomen Limited RUQ (LIVER/GB)  Result Date: 12/25/2021 CLINICAL DATA:  Elevated LFTs EXAM: ULTRASOUND ABDOMEN LIMITED RIGHT UPPER QUADRANT COMPARISON:  None Available. FINDINGS: Gallbladder: No gallstones or wall thickening visualized. No sonographic Murphy sign noted by sonographer. Common bile duct: Diameter: 2 mm Liver: Within the right hepatic lobe there is a 3.2 x 2.4 x 3.3 cm complex cystic structure with suggestion of internal nodularity and septations. Mild increased hepatic parenchymal echogenicity.  Portal vein is patent on color Doppler imaging with normal direction of blood flow towards the liver. Other: None. IMPRESSION: 1. There is a complex cystic structure within the right hepatic lobe measuring up to 3.3 cm. Recommend further evaluation with pre and post contrast-enhanced abdominal MRI. 2. Mild increased hepatic parenchymal echogenicity. Findings are nonspecific but can be seen in the setting of hepatic steatosis. 3. No cholelithiasis or sonographic evidence for acute cholecystitis. Electronically Signed   By: DLovey NewcomerM.D.   On: 12/25/2021 05:48   DG Knee 4 Views W/Patella Left  Result Date: 12/24/2021 CLINICAL DATA:  Left knee pain after fall EXAM: LEFT  KNEE - COMPLETE 5 VIEW COMPARISON:  None Available. FINDINGS: Acute, transversely oriented fracture through the patella, with mild displacement. No additional fracture is seen in the knee. Mild degenerative changes. Small joint effusion. IMPRESSION: Acute patellar fracture. Electronically Signed   By: Merilyn Baba M.D.   On: 12/24/2021 11:19   DG MYELOGRAPHY LUMBAR INJ LUMBOSACRAL  Result Date: 12/18/2021 CLINICAL DATA:  Low back pain extending into the lower extremities bilaterally. EXAM: LUMBAR MYELOGRAM FLUOROSCOPY: dictate in minutes and seconds PROCEDURE: After thorough discussion of risks and benefits of the procedure including bleeding, infection, injury to nerves, blood vessels, adjacent structures as well as headache and CSF leak, written and oral informed consent was obtained. Consent was obtained by Dr. San Morelle. Time out form was completed. Patient was positioned prone on the fluoroscopy table. Local anesthesia was provided with 1% lidocaine without epinephrine after prepped and draped in the usual sterile fashion. Puncture was performed at L2-3 using a 3 1/2 inch 22-gauge spinal needle via a left paramedian approach. Using a single pass through the dura, the needle was placed within the thecal sac, with return of  clear CSF. 15 mL of Isovue M-200 was injected into the thecal sac, with normal opacification of the nerve roots and cauda equina consistent with free flow within the subarachnoid space. I personally performed the lumbar puncture and administered the intrathecal contrast. I also personally supervised acquisition of the myelogram images. TECHNIQUE: Contiguous axial images were obtained through the Lumbar spine after the intrathecal infusion of infusion. Coronal and sagittal reconstructions were obtained of the axial image sets. COMPARISON:  None Available. FINDINGS: LUMBAR MYELOGRAM FINDINGS: Slight retrolisthesis is present at L3-4 and L4-5 alignment is not changed significantly with flexion or extension. No other significant listhesis is present. Mild rightward curvature is present at L4-5. Leftward curvature is centered at L2. Subarticular narrowing at L1-2 is worse on the right. Subarticular narrowing is worse on the left at L2-3. Broad-based disc protrusion is present with right greater than left subarticular narrowing at L3-4. The nerve roots fill normally on both sides. CT LUMBAR MYELOGRAM FINDINGS: The lumbar spine is imaged the midbody of T12 through the mid sacrum. An expansile lytic lesion of the sacrum measures at least 7.3 x 3.6 cm. Extraosseous extension of tumor is present. Diffuse lytic lesions are present throughout the visualized spine. Prominent lytic lesions are present within the spinous process of L2 and L3. No pathologic fracture is present. Lytic lesions are present within the left pedicle at L4 and L5. Limited imaging the abdomen is unremarkable. There is no significant adenopathy. No solid organ lesions are present. T12-L1: A shallow central disc protrusion is present without significant stenosis. L1-2: No significant disc protrusion or stenosis is present. L2-3: Sclerotic endplate changes are degenerative. Chronic loss of disc height is present. Leftward endplate osteophytes contribute 2  mild left foraminal stenosis. L3-4: An expansile lytic lesion is present within the right lamina. Adjacent ligamentum flavum thickening is present. Lytic lesions are present within the pedicles bilaterally. Mild bilateral facet hypertrophy is present. A broad-based disc bulge is present. This results in mild central and right foraminal narrowing. L4-5: Mild disc bulging and facet hypertrophy is present without significant stenosis. L5-S1: Mild disc bulging and facet hypertrophy is present. No significant stenosis is present. IMPRESSION: 1. Large expansile lytic lesion of the sacrum with extraosseous extension of tumor. 2. Diffuse lytic lesions throughout the visualized spine. This likely represents metastatic disease from the patient's known breast cancer. A second malignancy  or myeloma is considered less likely. 3. No pathologic fracture. 4. Mild left foraminal stenosis at L2-3. 5. Mild central and right foraminal stenosis at L3-4. 6. Mild disc bulging and facet hypertrophy at L4-5 and L5-S1 without significant stenosis. 7. Mild rightward curvature of the lumbar spine is centered at L4-5. 8. Subarticular narrowing at L1-2 is worse on the right. Electronically Signed   By: San Morelle M.D.   On: 12/18/2021 14:21   CT LUMBAR SPINE W CONTRAST  Result Date: 12/18/2021 CLINICAL DATA:  Low back pain extending into the lower extremities bilaterally. EXAM: LUMBAR MYELOGRAM FLUOROSCOPY: dictate in minutes and seconds PROCEDURE: After thorough discussion of risks and benefits of the procedure including bleeding, infection, injury to nerves, blood vessels, adjacent structures as well as headache and CSF leak, written and oral informed consent was obtained. Consent was obtained by Dr. San Morelle. Time out form was completed. Patient was positioned prone on the fluoroscopy table. Local anesthesia was provided with 1% lidocaine without epinephrine after prepped and draped in the usual sterile fashion.  Puncture was performed at L2-3 using a 3 1/2 inch 22-gauge spinal needle via a left paramedian approach. Using a single pass through the dura, the needle was placed within the thecal sac, with return of clear CSF. 15 mL of Isovue M-200 was injected into the thecal sac, with normal opacification of the nerve roots and cauda equina consistent with free flow within the subarachnoid space. I personally performed the lumbar puncture and administered the intrathecal contrast. I also personally supervised acquisition of the myelogram images. TECHNIQUE: Contiguous axial images were obtained through the Lumbar spine after the intrathecal infusion of infusion. Coronal and sagittal reconstructions were obtained of the axial image sets. COMPARISON:  None Available. FINDINGS: LUMBAR MYELOGRAM FINDINGS: Slight retrolisthesis is present at L3-4 and L4-5 alignment is not changed significantly with flexion or extension. No other significant listhesis is present. Mild rightward curvature is present at L4-5. Leftward curvature is centered at L2. Subarticular narrowing at L1-2 is worse on the right. Subarticular narrowing is worse on the left at L2-3. Broad-based disc protrusion is present with right greater than left subarticular narrowing at L3-4. The nerve roots fill normally on both sides. CT LUMBAR MYELOGRAM FINDINGS: The lumbar spine is imaged the midbody of T12 through the mid sacrum. An expansile lytic lesion of the sacrum measures at least 7.3 x 3.6 cm. Extraosseous extension of tumor is present. Diffuse lytic lesions are present throughout the visualized spine. Prominent lytic lesions are present within the spinous process of L2 and L3. No pathologic fracture is present. Lytic lesions are present within the left pedicle at L4 and L5. Limited imaging the abdomen is unremarkable. There is no significant adenopathy. No solid organ lesions are present. T12-L1: A shallow central disc protrusion is present without significant  stenosis. L1-2: No significant disc protrusion or stenosis is present. L2-3: Sclerotic endplate changes are degenerative. Chronic loss of disc height is present. Leftward endplate osteophytes contribute 2 mild left foraminal stenosis. L3-4: An expansile lytic lesion is present within the right lamina. Adjacent ligamentum flavum thickening is present. Lytic lesions are present within the pedicles bilaterally. Mild bilateral facet hypertrophy is present. A broad-based disc bulge is present. This results in mild central and right foraminal narrowing. L4-5: Mild disc bulging and facet hypertrophy is present without significant stenosis. L5-S1: Mild disc bulging and facet hypertrophy is present. No significant stenosis is present. IMPRESSION: 1. Large expansile lytic lesion of the sacrum with extraosseous extension  of tumor. 2. Diffuse lytic lesions throughout the visualized spine. This likely represents metastatic disease from the patient's known breast cancer. A second malignancy or myeloma is considered less likely. 3. No pathologic fracture. 4. Mild left foraminal stenosis at L2-3. 5. Mild central and right foraminal stenosis at L3-4. 6. Mild disc bulging and facet hypertrophy at L4-5 and L5-S1 without significant stenosis. 7. Mild rightward curvature of the lumbar spine is centered at L4-5. 8. Subarticular narrowing at L1-2 is worse on the right. Electronically Signed   By: San Morelle M.D.   On: 12/18/2021 14:21   MM 3D SCREEN BREAST BILATERAL  Result Date: 12/12/2021 CLINICAL DATA:  Screening. EXAM: DIGITAL SCREENING BILATERAL MAMMOGRAM WITH TOMOSYNTHESIS AND CAD TECHNIQUE: Bilateral screening digital craniocaudal and mediolateral oblique mammograms were obtained. Bilateral screening digital breast tomosynthesis was performed. The images were evaluated with computer-aided detection. COMPARISON:  Previous exam(s). ACR Breast Density Category b: There are scattered areas of fibroglandular density.  FINDINGS: There are no findings suspicious for malignancy. IMPRESSION: No mammographic evidence of malignancy. A result letter of this screening mammogram will be mailed directly to the patient. RECOMMENDATION: Screening mammogram in one year. (Code:SM-B-01Y) BI-RADS CATEGORY  1: Negative. Electronically Signed   By: Franki Cabot M.D.   On: 12/12/2021 15:15       IMPRESSION/PLAN: 1. Recurrent Metastatic Stage IIA, cT3N0M0, pT3N1aM0, grade 1, ER/PR positive, invasive ductal carcinoma of the left breast with bony metastases. Dr. Lisbeth Renshaw discusses the pathology findings and reviews the nature of metastatic breast cancer. He would recommend a course of palliative radiotherapy to the sacrum and to the thoracic spine to reduce risks of cord compression. We discussed the risks, benefits, short, and long term effects of radiotherapy, as well as the palliative intent, and the patient is interested in proceeding with treatment to both the thoracic spine and the sacrum. Dr. Lisbeth Renshaw discusses the delivery and logistics of radiotherapy and anticipates a course of 2 weeks of radiotherapy. Written consent is obtained and placed in the chart, a copy was provided to the patient. She will simulate today. 2. Painful bone metastases. She was offered pain medication with Tramadol. A new prescription will be sent in for the patient after reviewing the side effect profile.   In a visit lasting 60 minutes, greater than 50% of the time was spent face to face discussing the patient's condition, in preparation for the discussion, and coordinating the patient's care.   The above documentation reflects my direct findings during this shared patient visit. Please see the separate note by Dr. Lisbeth Renshaw on this date for the remainder of the patient's plan of care.    Carola Rhine, [facility]   **Disclaimer: This note was dictated with voice recognition software. Similar sounding words can inadvertently be transcribed and this note may  contain transcription errors which may not have been corrected upon publication of note.**

## 2021-12-30 ENCOUNTER — Ambulatory Visit
Admission: RE | Admit: 2021-12-30 | Discharge: 2021-12-30 | Disposition: A | Discharge status: Home / Self Care | Payer: Commercial Managed Care - PPO | Source: Ambulatory Visit | Attending: Radiation Oncology | Admitting: Radiation Oncology

## 2021-12-30 ENCOUNTER — Other Ambulatory Visit: Payer: Self-pay

## 2021-12-30 ENCOUNTER — Encounter: Payer: Self-pay | Admitting: Hematology and Oncology

## 2021-12-30 ENCOUNTER — Encounter: Payer: Self-pay | Admitting: Radiation Oncology

## 2021-12-30 ENCOUNTER — Telehealth: Payer: Commercial Managed Care - PPO | Admitting: Student

## 2021-12-30 ENCOUNTER — Other Ambulatory Visit: Payer: Self-pay | Admitting: *Deleted

## 2021-12-30 VITALS — BP 124/49 | HR 90 | Temp 97.4°F | Resp 18 | Ht 70.0 in

## 2021-12-30 DIAGNOSIS — C7951 Secondary malignant neoplasm of bone: Secondary | ICD-10-CM | POA: Insufficient documentation

## 2021-12-30 DIAGNOSIS — K7689 Other specified diseases of liver: Secondary | ICD-10-CM | POA: Diagnosis not present

## 2021-12-30 DIAGNOSIS — Z923 Personal history of irradiation: Secondary | ICD-10-CM | POA: Diagnosis not present

## 2021-12-30 DIAGNOSIS — M2540 Effusion, unspecified joint: Secondary | ICD-10-CM | POA: Insufficient documentation

## 2021-12-30 DIAGNOSIS — Z170 Estrogen receptor positive status [ER+]: Secondary | ICD-10-CM | POA: Diagnosis not present

## 2021-12-30 DIAGNOSIS — S82032D Displaced transverse fracture of left patella, subsequent encounter for closed fracture with routine healing: Secondary | ICD-10-CM

## 2021-12-30 DIAGNOSIS — Z79899 Other long term (current) drug therapy: Secondary | ICD-10-CM | POA: Insufficient documentation

## 2021-12-30 DIAGNOSIS — Z9012 Acquired absence of left breast and nipple: Secondary | ICD-10-CM | POA: Insufficient documentation

## 2021-12-30 DIAGNOSIS — Z79811 Long term (current) use of aromatase inhibitors: Secondary | ICD-10-CM | POA: Insufficient documentation

## 2021-12-30 DIAGNOSIS — M5137 Other intervertebral disc degeneration, lumbosacral region: Secondary | ICD-10-CM | POA: Insufficient documentation

## 2021-12-30 DIAGNOSIS — M47819 Spondylosis without myelopathy or radiculopathy, site unspecified: Secondary | ICD-10-CM | POA: Diagnosis not present

## 2021-12-30 DIAGNOSIS — Z803 Family history of malignant neoplasm of breast: Secondary | ICD-10-CM | POA: Insufficient documentation

## 2021-12-30 DIAGNOSIS — M545 Low back pain, unspecified: Secondary | ICD-10-CM | POA: Insufficient documentation

## 2021-12-30 DIAGNOSIS — M48061 Spinal stenosis, lumbar region without neurogenic claudication: Secondary | ICD-10-CM | POA: Diagnosis not present

## 2021-12-30 DIAGNOSIS — C50512 Malignant neoplasm of lower-outer quadrant of left female breast: Secondary | ICD-10-CM

## 2021-12-30 DIAGNOSIS — E039 Hypothyroidism, unspecified: Secondary | ICD-10-CM | POA: Diagnosis not present

## 2021-12-30 DIAGNOSIS — Z510 Encounter for antineoplastic radiation therapy: Secondary | ICD-10-CM | POA: Insufficient documentation

## 2021-12-30 DIAGNOSIS — Z5111 Encounter for antineoplastic chemotherapy: Secondary | ICD-10-CM | POA: Insufficient documentation

## 2021-12-30 DIAGNOSIS — Z515 Encounter for palliative care: Secondary | ICD-10-CM

## 2021-12-30 MED ORDER — MORPHINE SULFATE (PF) 4 MG/ML IV SOLN
1.0000 mg | Freq: Once | INTRAVENOUS | Status: AC
  Administered 2021-12-30: 1 mg via INTRAMUSCULAR
  Filled 2021-12-30: qty 0.3

## 2021-12-30 MED ORDER — TRAMADOL HCL 50 MG PO TABS: 50.0000 mg | ORAL_TABLET | Freq: Four times a day (QID) | ORAL | 0 refills | Status: DC | PRN

## 2021-12-30 MED ORDER — PALBOCICLIB 100 MG PO CAPS: 100.0000 mg | ORAL_CAPSULE | Freq: Every day | ORAL | 3 refills | Status: DC

## 2021-12-30 NOTE — Progress Notes (Signed)
Patient Care Team: Nicholas Lose, MD as PCP - General (Hematology and Oncology) Nicholas Lose, MD as Consulting Physician (Hematology and Oncology) Stark Klein, MD as Consulting Physician (General Surgery) Kyung Rudd, MD as Consulting Physician (Radiation Oncology)  DIAGNOSIS:  Encounter Diagnosis  Name Primary?   Malignant neoplasm of lower-outer quadrant of left breast of female, estrogen receptor positive ([place])     SUMMARY OF ONCOLOGIC HISTORY: Oncology History  Malignant neoplasm of lower-outer quadrant of left breast of female, estrogen receptor positive ([place])  06/11/2016 Mammogram   Palpable left breast masses 3:00 position: 2.2 cm; 5:30 position: 2.5 cm; 6:30 position: 0.7 cm   06/19/2016 Initial Diagnosis   Left breast biopsy 3:30: IDC with DCIS grade 1, ER 90%, PR 50%, Ki-67 15%, HER-2 negative ratio 1.13; biopsy 5:30 position: IDC grade 1   07/13/2016 Breast MRI   Large area of abnormal enhancement lower inner and lower outer quadrants left breast spanning 9 cm x 6.4 cm x 5.3 cm, no abnormal enlarged lymph nodes; T3 N0 stage II a (New AJCC staging)    07/15/2016 - 12/08/2017 Anti-estrogen oral therapy   Neoadjuvant anastrozole 1 mg daily   07/17/2016 Oncotype testing   Testing done on the biopsy: Oncotype DX score 22, intermediate risk   02/02/2017 Breast MRI   Left breast multicentric disease unchanged measuring 2.7 x 1.6 cm.  Mass in the non-mass enhancement are also not significantly changed measuring 6.2 x 2.4 cm. new enhancing mass within the outer right breast 7 mm which could be fat necrosis or inclusion cyst    02/09/2017 Imaging   Ultrasound of the right breast lesion noted on MRI: No sonographic finding corresponds to the abnormality noted on MRI   07/13/2017 Cancer Staging   Staging form: Breast, AJCC 8th Edition - Clinical stage from 07/13/2017: Stage IIA (cT3, cN0, cM0, G1, ER+, PR+, HER2-) - Signed by Gardenia Phlegm, NP on 05/18/2018   10/26/2017  Surgery   Left mastectomy: IDC grade 1, 2 foci largest spans 8.5 cm, intermediate grade DCIS, lymphovascular invasion identified, perineural invasion identified, 1/2 lymph nodes positive with extracapsular extension, ER 9200%, PR 5 to 50%, HER-2 negative, Ki-67 10 to 15%, T3N1A Mammaprint: low risk   11/02/2017 Cancer Staging   Staging form: Breast, AJCC 8th Edition - Pathologic: No Stage Recommended (ypT3, pN1a, cM0, G1, ER+, PR+, HER2-) - Signed by Nicholas Lose, MD on 11/02/2017   12/08/2017 - 01/26/2018 Radiation Therapy   Adjuvant radiation therapy    02/2018 -  Anti-estrogen oral therapy   Anastrozole 1 mg daily adjuvant therapy     CHIEF COMPLIANT: Follow-up metastatic breast cancer to discuss biopsy scans  INTERVAL HISTORY: Kathryn Lucas is a 61 y.o. with above-mentioned history of left breast cancer treated with mastectomy, radiation therapy, and is currently on anti-estrogen therapy with anastrozole.She reports that she cracked her left knee (patella). She states that she is in pain but not constant. She complains of pain sitting and walking. She says that it is ok when she lying down. Right leg has pain but not as bad as left. Neck is stiff. She is in excruciating pain if she puts any pressure on her left leg.  She has seen radiation oncology and they are planning to start radiation.  ALLERGIES:  is allergic to percocet [oxycodone-acetaminophen].  MEDICATIONS:  Current Outpatient Medications  Medication Sig Dispense Refill   anastrozole (ARIMIDEX) 1 MG tablet TAKE ONE TABLET BY MOUTH DAILY 90 tablet 3   Calcium 500-100  MG-UNIT CHEW Chew 1 tablet by mouth daily. 60 tablet    cholecalciferol (VITAMIN D3) 25 MCG (1000 UNIT) tablet Take 1 tablet (1,000 Units total) by mouth daily.     gabapentin (NEURONTIN) 300 MG capsule Take 300 mg by mouth 3 (three) times daily.     glycerin adult 2 g suppository Place 1 suppository rectally as needed for constipation. 12 suppository 0    levothyroxine (SYNTHROID, LEVOTHROID) 175 MCG tablet Take 112 mcg by mouth.      ondansetron (ZOFRAN) 8 MG tablet Take 1 tablet (8 mg total) by mouth every 8 (eight) hours as needed for nausea. 30 tablet 3   palbociclib (IBRANCE) 100 MG capsule Take 1 capsule (100 mg total) by mouth daily with breakfast. Take whole with food. Take for 21 days on, 7 days off, repeat every 28 days. 84 capsule 3   promethazine (PHENERGAN) 25 MG suppository Place 1 suppository (25 mg total) rectally every 6 (six) hours as needed for nausea or vomiting. 12 each 0   traMADol (ULTRAM) 50 MG tablet Take 1-2 tablets (50-100 mg total) by mouth every 6 (six) hours as needed. 30 tablet 0   vitamin C (ASCORBIC ACID) 250 MG tablet Take 1 tablet (250 mg total) by mouth daily.     zinc gluconate 50 MG tablet Take 1 tablet (50 mg total) by mouth daily.     No current facility-administered medications for this visit.    PHYSICAL EXAMINATION: ECOG PERFORMANCE STATUS: 3 - Symptomatic, >50% confined to bed  Vitals:   01/02/22 1053  BP: (!) 112/56  Pulse: 83  Resp: 18  Temp: 98.8 F (37.1 C)  SpO2: 98%   Filed Weights      LABORATORY DATA:  I have reviewed the data as listed    Latest Ref Rng & Units 01/02/2022   12:12 PM 12/25/2021    2:10 PM 08/07/2021    4:22 PM  CMP  Glucose 70 - 99 mg/dL 99  147  92   BUN 6 - 20 mg/dL $Remove'17  17  18   'WlOdBeK$ Creatinine 0.44 - 1.00 mg/dL 0.72  0.96  0.91   Sodium 135 - 145 mmol/L 136  134  135   Potassium 3.5 - 5.1 mmol/L 4.5  3.4  4.2   Chloride 98 - 111 mmol/L 106  96  102   CO2 22 - 32 mmol/L $RemoveB'22  29  27   'kxFOSzHX$ Calcium 8.9 - 10.3 mg/dL 8.4  11.6  9.0   Total Protein 6.5 - 8.1 g/dL 6.9  7.2    Total Bilirubin 0.3 - 1.2 mg/dL 0.4  0.9    Alkaline Phos 38 - 126 U/L 135  131    AST 15 - 41 U/L 105  236    ALT 0 - 44 U/L 25  112      Lab Results  Component Value Date   WBC 7.4 01/02/2022   HGB 12.2 01/02/2022   HCT 36.8 01/02/2022   MCV 88.9 01/02/2022   PLT 311 01/02/2022    NEUTROABS 5.9 01/02/2022    ASSESSMENT & PLAN:  Malignant neoplasm of lower-outer quadrant of left breast of female, estrogen receptor positive ([place]) 06/19/2016 Left breast biopsy 3:30: IDC with DCIS grade 1, ER 90%, PR 50%, Ki-67 15%, HER-2 negative ratio 1.13; biopsy 5:30 position: IDC grade 1   10/26/17: Left mastectomy: IDC grade 1, 2 foci largest spans 8.5 cm, intermediate grade DCIS, lymphovascular invasion identified, perineural invasion identified, 1/2 lymph nodes positive  with extracapsular extension, ER 9200%, PR 5 to 50%, HER-2 negative, Ki-67 10 to 15%, T3N1A   Oncotype DX score 22, intermediate risk, chemotherapy not felt to have significant benefit.   Treatment plan: 1. Antiestrogen therapy with anastrozole 1 mg daily started 07/15/2016 2. Mastectomy 10/26/2017, Mammaprint low risk luminal type A 3. Followed by adjuvant radiation 12/08/17- 01/26/18  4. Followed by adjuvant antiestrogen therapy anastrozole started 01/17/2018 (originally started 07/15/2016) -------------------------------------------------------------------- Low back pain August 2023: Underwent CT myelogram: Large expansile lesion in the sacrum with extraosseous extension of the tumor, diffuse lytic lesions throughout the visualized spine with metastatic disease myeloma is considered less likely.  (This was ordered by Dr. [place] Nakayama)   Urgent appointment today to discuss these results.  Treatment plan: 1.  PET CT scan 12/27/2021: Widespread bone metastatic disease largest lesion involving the sacrum with pathological fractures of T6 and T10 retroperitoneal and pelvic lymph node metastasis, right axillary lymph node, activity in the pancreatic head, hypermetabolic activity in the adrenal glands, right thyroid nodule. 2. biopsy of sacrum: 12/26/2021: Metastatic breast cancer, ER 90%, PR 10%, HER2 pending. 3.  S treatment plan: Ibrance along with Faslodex.  4.  Xgeva for bone metastases. 5.  Palliative radiation to  the sacrum Genetic testing for BRCA analysis.  Return to clinic in 10-26 2023 for next dose of Faslodex.  patient's Sister Ephraim Hamburger [phone] and updated her with the results and the plan.  She is her next of kin and power of attorney. She is requesting to speak with a Education officer, museum.  We will arrange for that. Prognosis: I discussed the family that prognosis will depend on whether she responds to the treatment and her performance status as well as development of brain metastases.  Leslee Home has not started yet. I will see her back to see how she is doing on Ibrance.   No orders of the defined types were placed in this encounter.  The patient has a good understanding of the overall plan. she agrees with it. she will call with any problems that may develop before the next visit here. Total time spent: 60 mins including face to face time and time spent for planning, charting and co-ordination of care   Harriette Ohara, MD 01/02/22    I Gardiner Coins am scribing for Dr. Lindi Adie  I have reviewed the above documentation for accuracy and completeness, and I agree with the above.

## 2021-12-30 NOTE — Addendum Note (Signed)
Encounter addended by: Cori Razor, RN on: 12/30/2021 2:25 PM  Actions taken: Order list changed, Diagnosis association updated, MAR administration accepted

## 2021-12-30 NOTE — Progress Notes (Signed)
Pt new pharmacy called requesting prescription to be sent to mail order pharmacy [place] in [place]. Ibrance prescription was discontinued at [facility] and new order was sent to patient new pharmacy.

## 2021-12-30 NOTE — Progress Notes (Signed)
[facility] Consult Note Telephone: [phone]  Fax: [phone]   Date of encounter: 12/30/21 10:07 AM PATIENT NAME: Kathryn Lucas [address] [place] [address]   [phone] (home)  DOB: 02/19/1961 MRN: [id number] PRIMARY CARE PROVIDER:    Pa, [place],  [place] [address] [place] [address] [phone]  REFERRING PROVIDER:   Pa, [place] [address], [place],  [place] [address] [phone]  RESPONSIBLE PARTY:    Contact Information     Name Relation Home Work Mobile   [place] Sister   [phone]       Due to the COVID-19 crisis, this visit was done via telemedicine from my office and it was initiated and consent by this patient and or family.  I connected with  [name] OR PROXY on 12/30/21 by a video enabled telemedicine application and verified that I am speaking with the correct person using two identifiers.   I discussed the limitations of evaluation and management by telemedicine. The patient expressed understanding and agreed to proceed.                                     ASSESSMENT AND PLAN / RECOMMENDATIONS:   Advance Care Planning/Goals of Care: Goals include to maximize quality of life and symptom management. Patient/health care surrogate gave his/her permission to discuss.Our advance care planning conversation included a discussion about:    The value and importance of advance care planning  Experiences with loved ones who have been seriously ill or have died  Exploration of personal, cultural or spiritual beliefs that might influence medical decisions  Exploration of goals of care in the event of a sudden injury or illness  HCPOA Sister Ephraim Hamburger Review and updating or creation of an  advance directive document  CODE STATUS: Full Code  Education provided on Palliative Medicine. Patient would  like to receive treatment for metastatic breast cancer, would like to remain in the home with additional caregiver assistance. Patient has spoken with Palliative SW.  Symptom Management/Plan:  Metastatic breast cancer-Patient wishes to continue treatment. Currently receiving anastrozole. Patient to receive Faslodex and Xgeva for bone metastases. ? Ibrance to be started. She is also to receive palliative radiation to sacrum. Patient expresses wanting to remain in the home with additional caregiver support given her left patella fracture.  Left patella fracture-currently wearing immobilizer. Continue ibuprofen for pain; she is to follow up with orthopedics.    Follow up Palliative Care Visit: Palliative care will continue to follow for complex medical decision making, advance care planning, and clarification of goals. Return in 4-6 weeks or prn.  This visit was coded based on medical decision making (MDM).   HOSPICE ELIGIBILITY/DIAGNOSIS: TBD  Chief Complaint: Palliative Medicine initial consult.   HISTORY OF PRESENT ILLNESS:  Kathryn Lucas is a 61 y.o. year old female  with malignant neoplasm of lower outer quadrant left breast, estrogen receptor positive, left patella fracture.  Patient reports living at home alone. She has a sister in [place]. She has friends that are taking to appointments, assisting with meals. She is currently using a walker for ambulation as she is s/p fall and has a left patellar fracture. She is wearing immobilizer to her left knee. She reports pain being a 6-7/10.  Pain is worse with movement, improves with lying  down. She was unable to tolerate the percocet and had severe nausea and vomiting. She has been taking ibuprofen for pain due to elevated liver enzymes; unable to take acetaminophen. Denies shortness of breath or constipation. Her appetite is good; no weight loss reported.   History obtained from review of EMR, discussion with primary team, and interview with  family, facility staff/caregiver and/or Ms. [place].  I reviewed available labs, medications, imaging, studies and related documents from the EMR.  Records reviewed and summarized above.   ROS  A 10-Point ROS is negative, except for the pertinent positives or negatives detailed her the HPI.  Physical Exam: Constitutional: NAD General: frail appearing EYES: anicteric sclera, lids intact, no discharge  ENMT: intact hearing, oral mucous membranes moist, dentition intact CV: deferred Pulmonary: no increased work of breathing, no cough, room air Abdomen: deferred GU: deferred MSK:  moves all extremities, ambulatory Skin: no rashes or wounds on visible skin Neuro:  + generalized weakness,  no cognitive impairment Psych: non-anxious affect, A and O x 3 Hem/lymph/immuno: no widespread bruising CURRENT PROBLEM LIST:  Patient Active Problem List   Diagnosis Date Noted   Metastatic malignant neoplasm ([place]) 12/19/2021   Family history of breast cancer    Breast cancer, left breast ([place]) 10/26/2017   Malignant neoplasm of lower-outer quadrant of left breast of female, estrogen receptor positive ([place]) 07/15/2016   Plantar fasciitis 07/30/2015   Metatarsalgia 10/18/2014   PAST MEDICAL HISTORY:  Active Ambulatory Problems    Diagnosis Date Noted   Metatarsalgia 10/18/2014   Plantar fasciitis 07/30/2015   Malignant neoplasm of lower-outer quadrant of left breast of female, estrogen receptor positive ([place]) 07/15/2016   Breast cancer, left breast ([place]) 10/26/2017   Family history of breast cancer    Metastatic malignant neoplasm ([place]) 12/19/2021   Resolved Ambulatory Problems    Diagnosis Date Noted   No Resolved Ambulatory Problems   Past Medical History:  Diagnosis Date   Breast cancer ([place])    Cancer ([place]) 09/2017   Hypothyroidism    Personal history of radiation therapy 2019   Thyroid disease    SOCIAL HX:  Social History   Tobacco Use   Smoking status: Never   Smokeless  tobacco: Never  Substance Use Topics   Alcohol use: Yes    Alcohol/week: 0.0 standard drinks of alcohol    Comment: social   FAMILY HX:  Family History  Problem Relation Age of Onset   Breast cancer Mother 8   Stroke Sister        thought to be due to tamoxifen use   Dementia Maternal Grandmother    COPD Paternal Grandfather       ALLERGIES:  Allergies  Allergen Reactions   Percocet [Oxycodone-Acetaminophen] Nausea And Vomiting    Severe GI upset and vomiting     PERTINENT MEDICATIONS:  Outpatient Encounter Medications as of 12/30/2021  Medication Sig   anastrozole (ARIMIDEX) 1 MG tablet TAKE ONE TABLET BY MOUTH DAILY   Calcium 500-100 MG-UNIT CHEW Chew 1 tablet by mouth daily.   cholecalciferol (VITAMIN D3) 25 MCG (1000 UNIT) tablet Take 1 tablet (1,000 Units total) by mouth daily.   gabapentin (NEURONTIN) 300 MG capsule Take 300 mg by mouth 3 (three) times daily.   glycerin adult 2 g suppository Place 1 suppository rectally as needed for constipation.   levothyroxine (SYNTHROID, LEVOTHROID) 175 MCG tablet Take 112 mcg by mouth.    meloxicam (MOBIC) 15 MG tablet Take 1 tablet (15 mg total) by  mouth daily. (Patient not taking: Reported on 12/26/2021)   ondansetron (ZOFRAN) 8 MG tablet Take 1 tablet (8 mg total) by mouth every 8 (eight) hours as needed for nausea.   oxyCODONE-acetaminophen (PERCOCET/ROXICET) 5-325 MG tablet Take 1 tablet by mouth every 4 (four) hours as needed for severe pain. (Patient not taking: Reported on 12/26/2021)   palbociclib (IBRANCE) 100 MG capsule Take 1 capsule (100 mg total) by mouth daily with breakfast. Take whole with food. Take for 21 days on, 7 days off, repeat every 28 days. (Patient not taking: Reported on 12/26/2021)   promethazine (PHENERGAN) 25 MG suppository Place 1 suppository (25 mg total) rectally every 6 (six) hours as needed for nausea or vomiting.   vitamin C (ASCORBIC ACID) 250 MG tablet Take 1 tablet (250 mg total) by mouth daily.    zinc gluconate 50 MG tablet Take 1 tablet (50 mg total) by mouth daily.   No facility-administered encounter medications on file as of 12/30/2021.   Thank you for the opportunity to participate in the care of Ms. Peterka.  The palliative care team will continue to follow. Please call our office at [phone] if we can be of additional assistance.   Ezekiel Slocumb, NP   COVID-19 PATIENT SCREENING TOOL Asked and negative response unless otherwise noted:  Have you had symptoms of covid, tested positive or been in contact with someone with symptoms/positive test in the past 5-10 days? No

## 2021-12-31 ENCOUNTER — Other Ambulatory Visit (HOSPITAL_COMMUNITY): Payer: Self-pay

## 2022-01-01 ENCOUNTER — Other Ambulatory Visit: Payer: Self-pay

## 2022-01-01 ENCOUNTER — Telehealth: Payer: Self-pay

## 2022-01-01 MED ORDER — PALBOCICLIB 100 MG PO CAPS: 100.0000 mg | ORAL_CAPSULE | Freq: Every day | ORAL | 3 refills | Status: DC

## 2022-01-01 NOTE — Telephone Encounter (Signed)
Received phone call from [place] at [facility] requesting that [facility] prescription be changed to [facility]. Prescription sent.

## 2022-01-02 ENCOUNTER — Inpatient Hospital Stay (HOSPITAL_BASED_OUTPATIENT_CLINIC_OR_DEPARTMENT_OTHER): Payer: Commercial Managed Care - PPO | Admitting: Hematology and Oncology

## 2022-01-02 ENCOUNTER — Inpatient Hospital Stay: Payer: Commercial Managed Care - PPO

## 2022-01-02 ENCOUNTER — Other Ambulatory Visit: Payer: Self-pay | Admitting: *Deleted

## 2022-01-02 ENCOUNTER — Ambulatory Visit
Admission: RE | Admit: 2022-01-02 | Discharge: 2022-01-02 | Disposition: A | Discharge status: Home / Self Care | Payer: Commercial Managed Care - PPO | Source: Ambulatory Visit | Attending: Radiation Oncology | Admitting: Radiation Oncology

## 2022-01-02 ENCOUNTER — Other Ambulatory Visit: Payer: Self-pay

## 2022-01-02 DIAGNOSIS — Z170 Estrogen receptor positive status [ER+]: Secondary | ICD-10-CM | POA: Diagnosis not present

## 2022-01-02 DIAGNOSIS — C50512 Malignant neoplasm of lower-outer quadrant of left female breast: Secondary | ICD-10-CM

## 2022-01-02 DIAGNOSIS — Z510 Encounter for antineoplastic radiation therapy: Secondary | ICD-10-CM | POA: Diagnosis not present

## 2022-01-02 LAB — CBC WITH DIFFERENTIAL (CANCER CENTER ONLY)
Abs Immature Granulocytes: 0.05 10*3/uL (ref 0.00–0.07)
Basophils Absolute: 0 10*3/uL (ref 0.0–0.1)
Basophils Relative: 1 %
Eosinophils Absolute: 0.1 10*3/uL (ref 0.0–0.5)
Eosinophils Relative: 1 %
HCT: 36.8 % (ref 36.0–46.0)
Hemoglobin: 12.2 g/dL (ref 12.0–15.0)
Immature Granulocytes: 1 %
Lymphocytes Relative: 10 %
Lymphs Abs: 0.7 10*3/uL (ref 0.7–4.0)
MCH: 29.5 pg (ref 26.0–34.0)
MCHC: 33.2 g/dL (ref 30.0–36.0)
MCV: 88.9 fL (ref 80.0–100.0)
Monocytes Absolute: 0.7 10*3/uL (ref 0.1–1.0)
Monocytes Relative: 9 %
Neutro Abs: 5.9 10*3/uL (ref 1.7–7.7)
Neutrophils Relative %: 78 %
Platelet Count: 311 10*3/uL (ref 150–400)
RBC: 4.14 MIL/uL (ref 3.87–5.11)
RDW: 15.4 % (ref 11.5–15.5)
WBC Count: 7.4 10*3/uL (ref 4.0–10.5)
nRBC: 0 % (ref 0.0–0.2)

## 2022-01-02 LAB — CMP (CANCER CENTER ONLY)
ALT: 25 U/L (ref 0–44)
AST: 105 U/L — ABNORMAL HIGH (ref 15–41)
Albumin: 3.8 g/dL (ref 3.5–5.0)
Alkaline Phosphatase: 135 U/L — ABNORMAL HIGH (ref 38–126)
Anion gap: 8 (ref 5–15)
BUN: 17 mg/dL (ref 6–20)
CO2: 22 mmol/L (ref 22–32)
Calcium: 8.4 mg/dL — ABNORMAL LOW (ref 8.9–10.3)
Chloride: 106 mmol/L (ref 98–111)
Creatinine: 0.72 mg/dL (ref 0.44–1.00)
GFR, Estimated: >60 mL/min (ref >60)
Glucose, Bld: 99 mg/dL (ref 70–99)
Potassium: 4.5 mmol/L (ref 3.5–5.1)
Sodium: 136 mmol/L (ref 135–145)
Total Bilirubin: 0.4 mg/dL (ref 0.3–1.2)
Total Protein: 6.9 g/dL (ref 6.5–8.1)

## 2022-01-02 MED ORDER — PALBOCICLIB 100 MG PO CAPS: 100.0000 mg | ORAL_CAPSULE | Freq: Every day | ORAL | 3 refills | Status: DC

## 2022-01-02 NOTE — Progress Notes (Signed)
Referral to SW was placed. Advised pt sister that SW was out of the office until next week and she will reach out to her as soon as possible. Pt and pt sister verbalized understanding.

## 2022-01-02 NOTE — Assessment & Plan Note (Addendum)
06/19/2016 Left breast biopsy 3:30: IDC with DCIS grade 1, ER 90%, PR 50%, Ki-67 15%, HER-2 negative ratio 1.13; biopsy 5:30 position: IDC grade 1  10/26/17:Left mastectomy: IDC grade 1, 2 foci largest spans 8.5 cm, intermediate grade DCIS, lymphovascular invasion identified, perineural invasion identified, 1/2 lymph nodes positive with extracapsular extension, ER 9200%, PR 5 to 50%, HER-2 negative, Ki-67 10 to 15%, T3N1A  Oncotype DXscore 22, intermediate risk, chemotherapy not felt to have significant benefit.  Treatment plan: 1. Antiestrogen therapy with anastrozole 1 mg daily started 07/15/2016 2.Mastectomy8/13/2019,Mammaprint low risk luminal typeA 3. Followed by adjuvant radiation9/25/19- 01/26/18 4. Followed by adjuvant antiestrogen therapy anastrozole started 01/17/2018 (originally started 07/15/2016) -------------------------------------------------------------------- Low back pain August 2023: Underwent CT myelogram: Large expansile lesion in the sacrum with extraosseous extension of the tumor, diffuse lytic lesions throughout the visualized spine with metastatic disease myeloma is considered less likely.  (This was ordered by Dr. [place] Nakayama)  Urgent appointment today to discuss these results.  Treatment plan: 1.  PET CT scan 12/27/2021: Widespread bone metastatic disease largest lesion involving the sacrum with pathological fractures of T6 and T10 retroperitoneal and pelvic lymph node metastasis, right axillary lymph node, activity in the pancreatic head, hypermetabolic activity in the adrenal glands, right thyroid nodule. 2. biopsy of sacrum: 12/26/2021: Metastatic breast cancer, ER 90%, PR 10%, HER2 pending. 3.  S treatment plan: Ibrance along with Faslodex.  4.  Xgeva for bone metastases. 5.  Palliative radiation to the sacrum  Return to clinic in 2 weeks for next dose of Faslodex. patient's Sister Ephraim Hamburger [phone] and updated her with the results and the  plan.  She is her next of kin and power of attorney.

## 2022-01-05 ENCOUNTER — Inpatient Hospital Stay: Payer: Commercial Managed Care - PPO | Admitting: Licensed Clinical Social Worker

## 2022-01-05 ENCOUNTER — Telehealth: Payer: Self-pay | Admitting: Licensed Clinical Social Worker

## 2022-01-05 LAB — SURGICAL PATHOLOGY

## 2022-01-05 NOTE — Progress Notes (Signed)
[place] Work  Clinical Social Work was referred by self for assessment of psychosocial needs.  Clinical Social Worker  attempted to contact pt by phone   to offer support and assess for needs.  Unable to speak with pt directly today. CSW did speak with pt's sister/ alternate contact, Rosemary. Per Francesca Jewett, urgent need is for help with applying for social security disability. CSW discussed referral to [facility] and submitted today. Will meet with pt tomorrow to have additional release signed.   Pt's sister noted that pt is having a hard time adjusting and may need mental health support. Sister lives in [place] and pt lives alone in [place]. They are currently paying out-of-pocket for her to have an aide at home with her. CSW reviewed that based on pt's insurance, there is not coverage for that services. CSW will assess more for support needs with pt tomorrow. E-mailed information on support programs through [facility] and [facility] to pt's sister.     [place], [place] Worker [employer]

## 2022-01-05 NOTE — Telephone Encounter (Signed)
[place] Work  Clinical Social Work was referred by nurse per pt request.  Clinical Social Worker attempted to contact patient by phone  to offer support and assess for needs.   No answer. Left VM with direct contact information.     [place], [place] Worker [employer]

## 2022-01-06 ENCOUNTER — Inpatient Hospital Stay: Payer: Commercial Managed Care - PPO | Admitting: Licensed Clinical Social Worker

## 2022-01-06 ENCOUNTER — Other Ambulatory Visit: Payer: Self-pay

## 2022-01-06 ENCOUNTER — Ambulatory Visit
Admission: RE | Admit: 2022-01-06 | Discharge: 2022-01-06 | Disposition: A | Discharge status: Home / Self Care | Payer: Commercial Managed Care - PPO | Source: Ambulatory Visit | Attending: Radiation Oncology | Admitting: Radiation Oncology

## 2022-01-06 DIAGNOSIS — C799 Secondary malignant neoplasm of unspecified site: Secondary | ICD-10-CM

## 2022-01-06 DIAGNOSIS — Z510 Encounter for antineoplastic radiation therapy: Secondary | ICD-10-CM | POA: Diagnosis not present

## 2022-01-06 LAB — RAD ONC ARIA SESSION SUMMARY

## 2022-01-06 NOTE — Progress Notes (Signed)
[place] CSW Progress Note  Holiday representative met with patient in radiology to obtain signature for the [employer]  application.  Signature obtained and sent to the [facility] on behalf of pt.  CSW provided contact information for primary CSW as well as information for supportive services available to pt through the cancer center.  Primary CSW to continue to follow pt as appropriate throughout duration of treatment.     Henriette Combs, LCSW

## 2022-01-07 ENCOUNTER — Other Ambulatory Visit: Payer: Self-pay | Admitting: Hematology and Oncology

## 2022-01-07 ENCOUNTER — Telehealth: Payer: Self-pay | Admitting: *Deleted

## 2022-01-07 ENCOUNTER — Other Ambulatory Visit: Payer: Self-pay | Admitting: *Deleted

## 2022-01-07 ENCOUNTER — Other Ambulatory Visit: Payer: Self-pay

## 2022-01-07 ENCOUNTER — Ambulatory Visit
Admission: RE | Admit: 2022-01-07 | Discharge: 2022-01-07 | Disposition: A | Discharge status: Home / Self Care | Payer: Commercial Managed Care - PPO | Source: Ambulatory Visit | Attending: Radiation Oncology | Admitting: Radiation Oncology

## 2022-01-07 DIAGNOSIS — Z510 Encounter for antineoplastic radiation therapy: Secondary | ICD-10-CM | POA: Diagnosis not present

## 2022-01-07 DIAGNOSIS — C799 Secondary malignant neoplasm of unspecified site: Secondary | ICD-10-CM

## 2022-01-07 LAB — RAD ONC ARIA SESSION SUMMARY

## 2022-01-07 NOTE — Telephone Encounter (Signed)
Received call from pt with compliant of ongoing constipation.  Pt states last bowel movement was today but was only a small amount.  Pt states she is passing gas and is drinking at least 64 oz of water a day. RN educated pt on OTC milk of magnesium as well as glycerin suppositories.  Pt states she has attempted a glycerin suppository but it was "too painful".  Pt educated if bowel movement is not achieved she may need to be seen in ED for further evaluation and disimpaction.  Pt verbalized understanding.

## 2022-01-08 ENCOUNTER — Ambulatory Visit
Admission: RE | Admit: 2022-01-08 | Discharge: 2022-01-08 | Disposition: A | Discharge status: Home / Self Care | Payer: Commercial Managed Care - PPO | Source: Ambulatory Visit | Attending: Radiation Oncology | Admitting: Radiation Oncology

## 2022-01-08 ENCOUNTER — Inpatient Hospital Stay: Payer: Commercial Managed Care - PPO | Admitting: Hematology and Oncology

## 2022-01-08 ENCOUNTER — Telehealth: Payer: Self-pay

## 2022-01-08 ENCOUNTER — Inpatient Hospital Stay: Payer: Commercial Managed Care - PPO

## 2022-01-08 ENCOUNTER — Inpatient Hospital Stay: Payer: Commercial Managed Care - PPO | Admitting: Nurse Practitioner

## 2022-01-08 ENCOUNTER — Other Ambulatory Visit: Payer: Self-pay

## 2022-01-08 ENCOUNTER — Other Ambulatory Visit: Payer: Self-pay | Admitting: Hematology and Oncology

## 2022-01-08 DIAGNOSIS — C799 Secondary malignant neoplasm of unspecified site: Secondary | ICD-10-CM

## 2022-01-08 DIAGNOSIS — Z510 Encounter for antineoplastic radiation therapy: Secondary | ICD-10-CM | POA: Diagnosis not present

## 2022-01-08 DIAGNOSIS — Z170 Estrogen receptor positive status [ER+]: Secondary | ICD-10-CM | POA: Diagnosis not present

## 2022-01-08 DIAGNOSIS — C50512 Malignant neoplasm of lower-outer quadrant of left female breast: Secondary | ICD-10-CM

## 2022-01-08 LAB — RAD ONC ARIA SESSION SUMMARY

## 2022-01-08 LAB — CBC WITH DIFFERENTIAL (CANCER CENTER ONLY)
Abs Immature Granulocytes: 0.01 10*3/uL (ref 0.00–0.07)
Basophils Absolute: 0 10*3/uL (ref 0.0–0.1)
Basophils Relative: 1 %
Eosinophils Absolute: 0 10*3/uL (ref 0.0–0.5)
Eosinophils Relative: 1 %
HCT: 38.8 % (ref 36.0–46.0)
Hemoglobin: 13.2 g/dL (ref 12.0–15.0)
Immature Granulocytes: 0 %
Lymphocytes Relative: 9 %
Lymphs Abs: 0.6 10*3/uL — ABNORMAL LOW (ref 0.7–4.0)
MCH: 29.5 pg (ref 26.0–34.0)
MCHC: 34 g/dL (ref 30.0–36.0)
MCV: 86.6 fL (ref 80.0–100.0)
Monocytes Absolute: 0.5 10*3/uL (ref 0.1–1.0)
Monocytes Relative: 7 %
Neutro Abs: 5.3 10*3/uL (ref 1.7–7.7)
Neutrophils Relative %: 82 %
Platelet Count: 271 10*3/uL (ref 150–400)
RBC: 4.48 MIL/uL (ref 3.87–5.11)
RDW: 15.9 % — ABNORMAL HIGH (ref 11.5–15.5)
WBC Count: 6.5 10*3/uL (ref 4.0–10.5)
nRBC: 0 % (ref 0.0–0.2)

## 2022-01-08 LAB — CMP (CANCER CENTER ONLY)
ALT: 18 U/L (ref 0–44)
AST: 135 U/L — ABNORMAL HIGH (ref 15–41)
Albumin: 4.1 g/dL (ref 3.5–5.0)
Alkaline Phosphatase: 179 U/L — ABNORMAL HIGH (ref 38–126)
Anion gap: 10 (ref 5–15)
BUN: 12 mg/dL (ref 6–20)
CO2: 20 mmol/L — ABNORMAL LOW (ref 22–32)
Calcium: 9.2 mg/dL (ref 8.9–10.3)
Chloride: 102 mmol/L (ref 98–111)
Creatinine: 0.62 mg/dL (ref 0.44–1.00)
GFR, Estimated: >60 mL/min (ref >60)
Glucose, Bld: 105 mg/dL — ABNORMAL HIGH (ref 70–99)
Potassium: 4.3 mmol/L (ref 3.5–5.1)
Sodium: 132 mmol/L — ABNORMAL LOW (ref 135–145)
Total Bilirubin: 0.6 mg/dL (ref 0.3–1.2)
Total Protein: 7.4 g/dL (ref 6.5–8.1)

## 2022-01-08 MED ORDER — PALBOCICLIB 100 MG PO CAPS: 100.0000 mg | ORAL_CAPSULE | Freq: Every day | ORAL | 3 refills | Status: DC

## 2022-01-08 MED ORDER — HYDROMORPHONE HCL 2 MG PO TABS: 2.0000 mg | ORAL_TABLET | Freq: Four times a day (QID) | ORAL | 0 refills | Status: DC | PRN

## 2022-01-08 MED ORDER — LORAZEPAM 0.5 MG PO TABS: 0.5000 mg | ORAL_TABLET | Freq: Every day | ORAL | 0 refills | Status: DC

## 2022-01-08 NOTE — Assessment & Plan Note (Addendum)
06/19/2016 Left breast biopsy 3:30: IDC with DCIS grade 1, ER 90%, PR 50%, Ki-67 15%, HER-2 negative ratio 1.13; biopsy 5:30 position: IDC grade 1  10/26/17:Left mastectomy: IDC grade 1, 2 foci largest spans 8.5 cm, intermediate grade DCIS, lymphovascular invasion identified, perineural invasion identified, 1/2 lymph nodes positive with extracapsular extension, ER 9200%, PR 5 to 50%, HER-2 negative, Ki-67 10 to 15%, T3N1A  Oncotype DXscore 22, intermediate risk, chemotherapy not felt to have significant benefit.  Treatment plan: 1. Antiestrogen therapy with anastrozole 1 mg daily started 07/15/2016 2.Mastectomy8/13/2019,Mammaprint low risk luminal typeA 3. Followed by adjuvant radiation9/25/19- 01/26/18 4. Followed by adjuvant antiestrogen therapyanastrozole started 01/17/2018 (originally started 07/15/2016) -------------------------------------------------------------------- Low back pain August 2023: Underwent CT myelogram: Large expansile lesion in the sacrum with extraosseous extension of the tumor, diffuse lytic lesions throughout the visualized spine with metastatic disease myeloma is considered less likely. (This was ordered by Dr. Melrose Nakayama)  Urgent appointment today to discuss these results.  Treatment plan: 1.PET CT scan 12/27/2021: Widespread bone metastatic disease largest lesion involving the sacrum with pathological fractures of T6 and T10 retroperitoneal and pelvic lymph node metastasis, right axillary lymph node, activity in the pancreatic head, hypermetabolic activity in the adrenal glands, right thyroid nodule. 2.biopsy ofsacrum: 12/26/2021: Metastatic breast cancer, ER 90%, PR 10%, HER2 negative (0) 3.Treatment plan: Ibrance along with Faslodex.  4.Zometa forbone metastases.  Every 3 months 5.Palliative radiation to the sacrum Genetic testing for BRCA  analysis. -------------------------------------------------------------------------------------------------------------------------------------- Current treatment: Ibrance with Faslodex and Zometa Toxicities: Leslee Home will be started tomorrow morning. She will come tomorrow to receive Faslodex injection. Her insurance denied Xgeva.  Therefore in 3 months she will receive Zometa infusion.  Severe bone pain: I sent a prescription for Dilaudid. Difficulty with sleeping: Sent a prescription for lorazepam.  Return to clinic in 2 weeks for injection and follow-up with labs

## 2022-01-08 NOTE — Telephone Encounter (Signed)
Called pt to confirm r/s of today's missed appt. Pt aware of new schedule.

## 2022-01-08 NOTE — Progress Notes (Signed)
Patient Care Team: Nicholas Lose, MD as PCP - General (Hematology and Oncology) Nicholas Lose, MD as Consulting Physician (Hematology and Oncology) Stark Klein, MD as Consulting Physician (General Surgery) Kyung Rudd, MD as Consulting Physician (Radiation Oncology)  DIAGNOSIS:  Encounter Diagnosis  Name Primary?   Malignant neoplasm of lower-outer quadrant of left breast of female, estrogen receptor positive ([place])     SUMMARY OF ONCOLOGIC HISTORY: Oncology History  Malignant neoplasm of lower-outer quadrant of left breast of female, estrogen receptor positive ([place])  06/11/2016 Mammogram   Palpable left breast masses 3:00 position: 2.2 cm; 5:30 position: 2.5 cm; 6:30 position: 0.7 cm   06/19/2016 Initial Diagnosis   Left breast biopsy 3:30: IDC with DCIS grade 1, ER 90%, PR 50%, Ki-67 15%, HER-2 negative ratio 1.13; biopsy 5:30 position: IDC grade 1   07/13/2016 Breast MRI   Large area of abnormal enhancement lower inner and lower outer quadrants left breast spanning 9 cm x 6.4 cm x 5.3 cm, no abnormal enlarged lymph nodes; T3 N0 stage II a (New AJCC staging)    07/15/2016 - 12/08/2017 Anti-estrogen oral therapy   Neoadjuvant anastrozole 1 mg daily   07/17/2016 Oncotype testing   Testing done on the biopsy: Oncotype DX score 22, intermediate risk   02/02/2017 Breast MRI   Left breast multicentric disease unchanged measuring 2.7 x 1.6 cm.  Mass in the non-mass enhancement are also not significantly changed measuring 6.2 x 2.4 cm. new enhancing mass within the outer right breast 7 mm which could be fat necrosis or inclusion cyst    02/09/2017 Imaging   Ultrasound of the right breast lesion noted on MRI: No sonographic finding corresponds to the abnormality noted on MRI   07/13/2017 Cancer Staging   Staging form: Breast, AJCC 8th Edition - Clinical stage from 07/13/2017: Stage IIA (cT3, cN0, cM0, G1, ER+, PR+, HER2-) - Signed by Gardenia Phlegm, NP on 05/18/2018   10/26/2017  Surgery   Left mastectomy: IDC grade 1, 2 foci largest spans 8.5 cm, intermediate grade DCIS, lymphovascular invasion identified, perineural invasion identified, 1/2 lymph nodes positive with extracapsular extension, ER 9200%, PR 5 to 50%, HER-2 negative, Ki-67 10 to 15%, T3N1A Mammaprint: low risk   11/02/2017 Cancer Staging   Staging form: Breast, AJCC 8th Edition - Pathologic: No Stage Recommended (ypT3, pN1a, cM0, G1, ER+, PR+, HER2-) - Signed by Nicholas Lose, MD on 11/02/2017   12/08/2017 - 01/26/2018 Radiation Therapy   Adjuvant radiation therapy    02/2018 -  Anti-estrogen oral therapy   Anastrozole 1 mg daily adjuvant therapy     CHIEF COMPLIANT: Follow-up to discuss Zometa instead of Xgeva  INTERVAL HISTORY: Kathryn Lucas is a 61 y.o. with above-mentioned history of left breast cancer treated with mastectomy, radiation therapy, and is currently on anti-estrogen therapy with anastrozole. She presents to the clinic for a follow-up. She reports that she is a lot of pain.    ALLERGIES:  is allergic to percocet [oxycodone-acetaminophen].  MEDICATIONS:  Current Outpatient Medications  Medication Sig Dispense Refill   anastrozole (ARIMIDEX) 1 MG tablet TAKE ONE TABLET BY MOUTH DAILY 90 tablet 3   Calcium 500-100 MG-UNIT CHEW Chew 1 tablet by mouth daily. 60 tablet    cholecalciferol (VITAMIN D3) 25 MCG (1000 UNIT) tablet Take 1 tablet (1,000 Units total) by mouth daily.     gabapentin (NEURONTIN) 300 MG capsule Take 300 mg by mouth 3 (three) times daily.     glycerin adult 2 g suppository  Place 1 suppository rectally as needed for constipation. 12 suppository 0   HYDROmorphone (DILAUDID) 2 MG tablet Take 1 tablet (2 mg total) by mouth every 6 (six) hours as needed for severe pain. 60 tablet 0   levothyroxine (SYNTHROID, LEVOTHROID) 175 MCG tablet Take 112 mcg by mouth.      LORazepam (ATIVAN) 0.5 MG tablet Take 1 tablet (0.5 mg total) by mouth at bedtime. 30 tablet 0   ondansetron  (ZOFRAN) 8 MG tablet Take 1 tablet (8 mg total) by mouth every 8 (eight) hours as needed for nausea. 30 tablet 3   palbociclib (IBRANCE) 100 MG capsule Take 1 capsule (100 mg total) by mouth daily with breakfast. Take whole with food. Take for 21 days. (As directed by MD) 84 capsule 3   promethazine (PHENERGAN) 25 MG suppository Place 1 suppository (25 mg total) rectally every 6 (six) hours as needed for nausea or vomiting. 12 each 0   traMADol (ULTRAM) 50 MG tablet Take 1-2 tablets (50-100 mg total) by mouth every 6 (six) hours as needed. 30 tablet 0   vitamin C (ASCORBIC ACID) 250 MG tablet Take 1 tablet (250 mg total) by mouth daily.     zinc gluconate 50 MG tablet Take 1 tablet (50 mg total) by mouth daily.     No current facility-administered medications for this visit.    PHYSICAL EXAMINATION: ECOG PERFORMANCE STATUS: 3 - Symptomatic, >50% confined to bed  Vitals:   01/08/22 1426  BP: (!) 127/95  Pulse: (!) 108  Resp: 18  Temp: (!) 97.5 F (36.4 C)  SpO2: 96%   There were no vitals filed for this visit.    LABORATORY DATA:  I have reviewed the data as listed    Latest Ref Rng & Units 01/08/2022    2:10 PM 01/02/2022   12:12 PM 12/25/2021    2:10 PM  CMP  Glucose 70 - 99 mg/dL 105  99  147   BUN 6 - 20 mg/dL $Remove'12  17  17   'CCPXfCH$ Creatinine 0.44 - 1.00 mg/dL 0.62  0.72  0.96   Sodium 135 - 145 mmol/L 132  136  134   Potassium 3.5 - 5.1 mmol/L 4.3  4.5  3.4   Chloride 98 - 111 mmol/L 102  106  96   CO2 22 - 32 mmol/L $RemoveB'20  22  29   'lnQJOswV$ Calcium 8.9 - 10.3 mg/dL 9.2  8.4  11.6   Total Protein 6.5 - 8.1 g/dL 7.4  6.9  7.2   Total Bilirubin 0.3 - 1.2 mg/dL 0.6  0.4  0.9   Alkaline Phos 38 - 126 U/L 179  135  131   AST 15 - 41 U/L 135  105  236   ALT 0 - 44 U/L 18  25  112     Lab Results  Component Value Date   WBC 6.5 01/08/2022   HGB 13.2 01/08/2022   HCT 38.8 01/08/2022   MCV 86.6 01/08/2022   PLT 271 01/08/2022   NEUTROABS 5.3 01/08/2022    ASSESSMENT & PLAN:   Malignant neoplasm of lower-outer quadrant of left breast of female, estrogen receptor positive ([place]) 06/19/2016 Left breast biopsy 3:30: IDC with DCIS grade 1, ER 90%, PR 50%, Ki-67 15%, HER-2 negative ratio 1.13; biopsy 5:30 position: IDC grade 1   10/26/17: Left mastectomy: IDC grade 1, 2 foci largest spans 8.5 cm, intermediate grade DCIS, lymphovascular invasion identified, perineural invasion identified, 1/2 lymph nodes positive with extracapsular extension,  ER 9200%, PR 5 to 50%, HER-2 negative, Ki-67 10 to 15%, T3N1A   Oncotype DX score 22, intermediate risk, chemotherapy not felt to have significant benefit.   Treatment plan: 1. Antiestrogen therapy with anastrozole 1 mg daily started 07/15/2016 2. Mastectomy 10/26/2017, Mammaprint low risk luminal type A 3. Followed by adjuvant radiation 12/08/17- 01/26/18  4. Followed by adjuvant antiestrogen therapy anastrozole started 01/17/2018 (originally started 07/15/2016) -------------------------------------------------------------------- Low back pain August 2023: Underwent CT myelogram: Large expansile lesion in the sacrum with extraosseous extension of the tumor, diffuse lytic lesions throughout the visualized spine with metastatic disease myeloma is considered less likely.  (This was ordered by Dr. Melrose Nakayama)   Urgent appointment today to discuss these results.   Treatment plan: 1.  PET CT scan 12/27/2021: Widespread bone metastatic disease largest lesion involving the sacrum with pathological fractures of T6 and T10 retroperitoneal and pelvic lymph node metastasis, right axillary lymph node, activity in the pancreatic head, hypermetabolic activity in the adrenal glands, right thyroid nodule. 2. biopsy of sacrum: 12/26/2021: Metastatic breast cancer, ER 90%, PR 10%, HER2 negative (0) 3.  Treatment plan: Ibrance along with Faslodex.  4.  Zometa for bone metastases.  Every 3 months 5.  Palliative radiation to the sacrum Genetic testing for  BRCA analysis. -------------------------------------------------------------------------------------------------------------------------------------- Current treatment: Ibrance with Faslodex and Zometa, palliative radiation Toxicities: Kathryn Lucas will be started tomorrow morning. She will come tomorrow to receive Faslodex injection. Her insurance denied Xgeva.  Therefore in 3 months she will receive Zometa infusion.  Severe bone pain: I sent a prescription for Dilaudid. Difficulty with sleeping: Sent a prescription for lorazepam.  Return to clinic in 2 weeks for injection and follow-up with labs     No orders of the defined types were placed in this encounter.  The patient has a good understanding of the overall plan. she agrees with it. she will call with any problems that may develop before the next visit here. Total time spent: 30 mins including face to face time and time spent for planning, charting and co-ordination of care   Harriette Ohara, MD 01/08/22    I Gardiner Coins am scribing for Dr. Lindi Adie  I have reviewed the above documentation for accuracy and completeness, and I agree with the above.

## 2022-01-08 NOTE — Telephone Encounter (Signed)
Oral Chemotherapy Pharmacist Encounter  I spoke with patient for overview of: Ibrance for the treatment of metastatic, hormone-receptor positive, HER2 receptor negative breast cancer, in combination with Faslodex, planned duration until disease progression or unacceptable toxicity.   Counseled patient on administration, dosing, side effects, monitoring, drug-food interactions, safe handling, storage, and disposal.  Patient will take Ibrance 141m tablets, 1 tablet by mouth once daily, with or without food, taken for 3 weeks on, 1 week off, and repeated.  Patient knows to avoid grapefruit and grapefruit juice while on treatment with Ibrance.  Ibrance start date: 01/09/2022  Adverse effects include but are not limited to: fatigue, hair loss, GI upset, nausea, decreased blood counts, and increased upper respiratory infections. Severe, life-threatening, and/or fatal interstitial lung disease (ILD) and/or pneumonitis may occur with CDK 4/6 inhibitors.  Patient will obtain anti diarrheal and alert the office of 4 or more loose stools above baseline.  Patient reminded of WBC check on Cycle 1 Day 14 for dose and ANC assessment.  Reviewed with patient importance of keeping a medication schedule and plan for any missed doses. No barriers to medication adherence identified.  Medication reconciliation performed and medication/allergy list updated.  Insurance authorization for ILeslee Homehas been obtained. Test claim at the pharmacy revealed copayment $0 for 1st fill of 28 days. Patient receives medication through [facility] and it is set to be delivered by 7pm tonight.  Patient informed the pharmacy will reach out 5-7 days prior to needing next fill of Ibrance to coordinate continued medication acquisition to prevent break in therapy.  All questions answered.  Ms. GBiltonvoiced understanding and appreciation.   Medication education handout placed in mail for patient. Patient knows to call the office  with questions or concerns. Oral Chemotherapy Clinic phone number provided to patient.   KDrema Halon PharmD Hematology/Oncology Clinical Pharmacist [phone]/26/2023   2:27 PM

## 2022-01-09 ENCOUNTER — Inpatient Hospital Stay: Payer: Commercial Managed Care - PPO

## 2022-01-09 ENCOUNTER — Other Ambulatory Visit: Payer: Self-pay

## 2022-01-09 ENCOUNTER — Ambulatory Visit
Admission: RE | Admit: 2022-01-09 | Discharge: 2022-01-09 | Disposition: A | Discharge status: Home / Self Care | Payer: Commercial Managed Care - PPO | Source: Ambulatory Visit | Attending: Radiation Oncology | Admitting: Radiation Oncology

## 2022-01-09 ENCOUNTER — Other Ambulatory Visit: Payer: Self-pay | Admitting: Hematology and Oncology

## 2022-01-09 VITALS — BP 136/87 | HR 98 | Temp 98.1°F | Resp 18

## 2022-01-09 DIAGNOSIS — C799 Secondary malignant neoplasm of unspecified site: Secondary | ICD-10-CM

## 2022-01-09 DIAGNOSIS — Z510 Encounter for antineoplastic radiation therapy: Secondary | ICD-10-CM | POA: Diagnosis not present

## 2022-01-09 DIAGNOSIS — Z170 Estrogen receptor positive status [ER+]: Secondary | ICD-10-CM

## 2022-01-09 LAB — RAD ONC ARIA SESSION SUMMARY

## 2022-01-09 MED ORDER — LORAZEPAM 0.5 MG PO TABS: 0.5000 mg | ORAL_TABLET | Freq: Every day | ORAL | 0 refills | Status: DC

## 2022-01-09 MED ORDER — FULVESTRANT 250 MG/5ML IM SOSY
500.0000 mg | PREFILLED_SYRINGE | Freq: Once | INTRAMUSCULAR | Status: AC
  Administered 2022-01-09: 500 mg via INTRAMUSCULAR
  Filled 2022-01-09: qty 10

## 2022-01-09 MED ORDER — HYDROMORPHONE HCL 2 MG PO TABS: 2.0000 mg | ORAL_TABLET | Freq: Four times a day (QID) | ORAL | 0 refills | Status: DC | PRN

## 2022-01-09 NOTE — Patient Instructions (Signed)

## 2022-01-12 ENCOUNTER — Ambulatory Visit
Admission: RE | Admit: 2022-01-12 | Discharge: 2022-01-12 | Disposition: A | Discharge status: Home / Self Care | Payer: Commercial Managed Care - PPO | Source: Ambulatory Visit | Attending: Radiation Oncology | Admitting: Radiation Oncology

## 2022-01-12 ENCOUNTER — Other Ambulatory Visit: Payer: Self-pay

## 2022-01-12 ENCOUNTER — Telehealth: Payer: Self-pay | Admitting: Hematology and Oncology

## 2022-01-12 ENCOUNTER — Telehealth: Payer: Self-pay | Admitting: Licensed Clinical Social Worker

## 2022-01-12 DIAGNOSIS — Z510 Encounter for antineoplastic radiation therapy: Secondary | ICD-10-CM | POA: Diagnosis not present

## 2022-01-12 LAB — RAD ONC ARIA SESSION SUMMARY

## 2022-01-12 NOTE — Telephone Encounter (Signed)
Scheduled appointment per 10/27 los. Left voicemail.

## 2022-01-12 NOTE — Telephone Encounter (Signed)
[place] [employer]  CSW received e-mail from pt's sister requesting information and extending FMLA, ADA benefits, and insurance options as pt's FMLA is ending Thanksgiving Day.  CSW sent basic information on FMLA/ADA including [employer] website as well as on insurance options with COBRA, [employer], hospital), and Medicaid.  Provided information on Triage Cancer navigation program for further assistance.   Christeen Douglas, LCSW, OSW-C

## 2022-01-13 ENCOUNTER — Other Ambulatory Visit: Payer: Self-pay

## 2022-01-13 ENCOUNTER — Ambulatory Visit
Admission: RE | Admit: 2022-01-13 | Discharge: 2022-01-13 | Disposition: A | Discharge status: Home / Self Care | Payer: Commercial Managed Care - PPO | Source: Ambulatory Visit | Attending: Radiation Oncology | Admitting: Radiation Oncology

## 2022-01-13 DIAGNOSIS — Z510 Encounter for antineoplastic radiation therapy: Secondary | ICD-10-CM | POA: Diagnosis not present

## 2022-01-13 LAB — RAD ONC ARIA SESSION SUMMARY

## 2022-01-14 ENCOUNTER — Ambulatory Visit
Admission: RE | Admit: 2022-01-14 | Discharge: 2022-01-14 | Disposition: A | Discharge status: Home / Self Care | Payer: Commercial Managed Care - PPO | Source: Ambulatory Visit | Attending: Radiation Oncology | Admitting: Radiation Oncology

## 2022-01-14 ENCOUNTER — Other Ambulatory Visit: Payer: Self-pay

## 2022-01-14 DIAGNOSIS — Z79811 Long term (current) use of aromatase inhibitors: Secondary | ICD-10-CM | POA: Insufficient documentation

## 2022-01-14 DIAGNOSIS — Z170 Estrogen receptor positive status [ER+]: Secondary | ICD-10-CM | POA: Insufficient documentation

## 2022-01-14 DIAGNOSIS — Z5111 Encounter for antineoplastic chemotherapy: Secondary | ICD-10-CM | POA: Diagnosis present

## 2022-01-14 DIAGNOSIS — Z510 Encounter for antineoplastic radiation therapy: Secondary | ICD-10-CM | POA: Insufficient documentation

## 2022-01-14 DIAGNOSIS — Z9012 Acquired absence of left breast and nipple: Secondary | ICD-10-CM | POA: Diagnosis not present

## 2022-01-14 DIAGNOSIS — C50512 Malignant neoplasm of lower-outer quadrant of left female breast: Secondary | ICD-10-CM | POA: Diagnosis not present

## 2022-01-14 DIAGNOSIS — C7951 Secondary malignant neoplasm of bone: Secondary | ICD-10-CM | POA: Insufficient documentation

## 2022-01-14 LAB — RAD ONC ARIA SESSION SUMMARY

## 2022-01-15 ENCOUNTER — Other Ambulatory Visit: Payer: Self-pay

## 2022-01-15 ENCOUNTER — Inpatient Hospital Stay: Payer: Commercial Managed Care - PPO | Attending: Hematology and Oncology | Admitting: Nurse Practitioner

## 2022-01-15 ENCOUNTER — Encounter: Payer: Self-pay | Admitting: Nurse Practitioner

## 2022-01-15 ENCOUNTER — Ambulatory Visit
Admission: RE | Admit: 2022-01-15 | Discharge: 2022-01-15 | Disposition: A | Discharge status: Home / Self Care | Payer: Commercial Managed Care - PPO | Source: Ambulatory Visit | Attending: Radiation Oncology | Admitting: Radiation Oncology

## 2022-01-15 VITALS — BP 114/69 | HR 101 | Temp 97.6°F | Resp 18

## 2022-01-15 DIAGNOSIS — G893 Neoplasm related pain (acute) (chronic): Secondary | ICD-10-CM | POA: Diagnosis not present

## 2022-01-15 DIAGNOSIS — C50512 Malignant neoplasm of lower-outer quadrant of left female breast: Secondary | ICD-10-CM | POA: Insufficient documentation

## 2022-01-15 DIAGNOSIS — R531 Weakness: Secondary | ICD-10-CM

## 2022-01-15 DIAGNOSIS — Z7189 Other specified counseling: Secondary | ICD-10-CM

## 2022-01-15 DIAGNOSIS — C50912 Malignant neoplasm of unspecified site of left female breast: Secondary | ICD-10-CM

## 2022-01-15 DIAGNOSIS — Z9012 Acquired absence of left breast and nipple: Secondary | ICD-10-CM | POA: Diagnosis not present

## 2022-01-15 DIAGNOSIS — Z923 Personal history of irradiation: Secondary | ICD-10-CM | POA: Diagnosis not present

## 2022-01-15 DIAGNOSIS — Z79811 Long term (current) use of aromatase inhibitors: Secondary | ICD-10-CM | POA: Diagnosis not present

## 2022-01-15 DIAGNOSIS — Z79891 Long term (current) use of opiate analgesic: Secondary | ICD-10-CM | POA: Insufficient documentation

## 2022-01-15 DIAGNOSIS — Z170 Estrogen receptor positive status [ER+]: Secondary | ICD-10-CM | POA: Insufficient documentation

## 2022-01-15 DIAGNOSIS — R530 Neoplastic (malignant) related fatigue: Secondary | ICD-10-CM | POA: Diagnosis not present

## 2022-01-15 DIAGNOSIS — Z515 Encounter for palliative care: Secondary | ICD-10-CM | POA: Insufficient documentation

## 2022-01-15 DIAGNOSIS — Z79899 Other long term (current) drug therapy: Secondary | ICD-10-CM | POA: Diagnosis not present

## 2022-01-15 DIAGNOSIS — Z5111 Encounter for antineoplastic chemotherapy: Secondary | ICD-10-CM | POA: Diagnosis present

## 2022-01-15 LAB — RAD ONC ARIA SESSION SUMMARY
Course Elapsed Days: 9
Plan Fractions Treated to Date: 8
Plan Fractions Treated to Date: 8
Plan Prescribed Dose Per Fraction: 3 Gy
Plan Prescribed Dose Per Fraction: 3 Gy
Plan Total Fractions Prescribed: 10
Plan Total Fractions Prescribed: 10
Plan Total Prescribed Dose: 30 Gy
Plan Total Prescribed Dose: 30 Gy
Reference Point Dosage Given to Date: 24 Gy
Reference Point Dosage Given to Date: 24 Gy
Reference Point Session Dosage Given: 3 Gy
Reference Point Session Dosage Given: 3 Gy
Session Number: 8

## 2022-01-15 MED ORDER — MORPHINE SULFATE ER 15 MG PO TBCR: 15.0000 mg | EXTENDED_RELEASE_TABLET | Freq: Two times a day (BID) | ORAL | 0 refills | Status: DC

## 2022-01-15 NOTE — Progress Notes (Signed)
[place]  [phone]) [phone] [phone]) [phone]   Name: MYSHA PEELER Date: 01/15/2022 MRN: [id number]  DOB: [birth year]  Patient Care Team: Nicholas Lose, MD as PCP - General (Hematology and Oncology) Nicholas Lose, MD as Consulting Physician (Hematology and Oncology) Stark Klein, MD as Consulting Physician (General Surgery) Kyung Rudd, MD as Consulting Physician (Radiation Oncology)    REASON FOR CONSULTATION: KEELIE ZEMANEK is a 61 y.o. 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 Recent diagnosis (10/2021) of large sacral lesion with diffuse lytic lesions throughout the spine. Palliative ask to see for symptom management and goals of care.    SOCIAL HISTORY:     reports that she has never smoked. She has never used smokeless tobacco. She reports current alcohol use. She reports that she does not use drugs.  ADVANCE DIRECTIVES:    CODE STATUS:   PAST MEDICAL HISTORY: Past Medical History:  Diagnosis Date   Breast cancer ([place])    left breast cancer   Cancer ([place]) 09/2017   left breast cancer   Family history of breast cancer    Hypothyroidism    Personal history of radiation therapy 2019   Thyroid disease     PAST SURGICAL HISTORY:  Past Surgical History:  Procedure Laterality Date   BREAST BIOPSY Left 2018   BREAST BIOPSY Left 2019   BREAST RECONSTRUCTION WITH PLACEMENT OF TISSUE EXPANDER AND ALLODERM Left 10/26/2017   Procedure: LEFT BREAST RECONSTRUCTION WITH PLACEMENT OF TISSUE EXPANDER AND ALLODERM;  Surgeon: Irene Limbo, MD;  Location: [facility];  Service: Plastics;  Laterality: Left;   MASTECTOMY Left 2019   MASTECTOMY WITH RADIOACTIVE SEED GUIDED EXCISION AND AXILLARY SENTINEL LYMPH NODE BIOPSY Left 10/26/2017   Procedure: LEFT MASTECTOMY WITH SEED TARGETED  LEFT AXILLARY LYMPH NODE EXCISION AND LEFT  SENTINEL LYMPH NODE BIOPSY;  Surgeon: Stark Klein, MD;  Location: [facility];  Service: General;  Laterality: Left;   TONSILLECTOMY     WISDOM TOOTH EXTRACTION      HEMATOLOGY/ONCOLOGY HISTORY:  Oncology History  Malignant neoplasm of lower-outer quadrant of left breast of female, estrogen receptor positive ([place])  06/11/2016 Mammogram   Palpable left breast masses 3:00 position: 2.2 cm; 5:30 position: 2.5 cm; 6:30 position: 0.7 cm   06/19/2016 Initial Diagnosis   Left breast biopsy 3:30: IDC with DCIS grade 1, ER 90%, PR 50%, Ki-67 15%, HER-2 negative ratio 1.13; biopsy 5:30 position: IDC grade 1   07/13/2016 Breast MRI   Large area of abnormal enhancement lower inner and lower outer quadrants left breast spanning 9 cm x 6.4 cm x 5.3 cm, no abnormal enlarged lymph nodes; T3 N0 stage II a (New AJCC staging)    07/15/2016 - 12/08/2017 Anti-estrogen oral therapy   Neoadjuvant anastrozole 1 mg daily   07/17/2016 Oncotype testing   Testing done on the biopsy: Oncotype DX score 22, intermediate risk   02/02/2017 Breast MRI   Left breast multicentric disease unchanged measuring 2.7 x 1.6 cm.  Mass in the non-mass enhancement are also not significantly changed measuring 6.2 x 2.4 cm. new enhancing mass within the outer right breast 7 mm which could be fat necrosis or inclusion cyst    02/09/2017 Imaging   Ultrasound of the right breast lesion noted on MRI: No sonographic finding corresponds to the abnormality noted on MRI   07/13/2017 Cancer Staging   Staging form: Breast,  AJCC 8th Edition - Clinical stage from 07/13/2017: Stage IIA (cT3, cN0, cM0, G1, ER+, PR+, HER2-) - Signed by Gardenia Phlegm, NP on 05/18/2018   10/26/2017 Surgery   Left mastectomy: IDC grade 1, 2 foci largest spans 8.5 cm, intermediate grade DCIS, lymphovascular invasion identified, perineural invasion identified, 1/2 lymph nodes positive with extracapsular extension, ER 9200%, PR 5 to 50%, HER-2  negative, Ki-67 10 to 15%, T3N1A Mammaprint: low risk   11/02/2017 Cancer Staging   Staging form: Breast, AJCC 8th Edition - Pathologic: No Stage Recommended (ypT3, pN1a, cM0, G1, ER+, PR+, HER2-) - Signed by Nicholas Lose, MD on 11/02/2017   12/08/2017 - 01/26/2018 Radiation Therapy   Adjuvant radiation therapy    02/2018 -  Anti-estrogen oral therapy   Anastrozole 1 mg daily adjuvant therapy     ALLERGIES:  is allergic to percocet [oxycodone-acetaminophen].  MEDICATIONS:  Current Outpatient Medications  Medication Sig Dispense Refill   morphine (MS CONTIN) 15 MG 12 hr tablet Take 1 tablet (15 mg total) by mouth every 12 (twelve) hours. 30 tablet 0   anastrozole (ARIMIDEX) 1 MG tablet TAKE ONE TABLET BY MOUTH DAILY 90 tablet 3   Calcium 500-100 MG-UNIT CHEW Chew 1 tablet by mouth daily. 60 tablet    cholecalciferol (VITAMIN D3) 25 MCG (1000 UNIT) tablet Take 1 tablet (1,000 Units total) by mouth daily.     gabapentin (NEURONTIN) 300 MG capsule Take 300 mg by mouth 3 (three) times daily.     glycerin adult 2 g suppository Place 1 suppository rectally as needed for constipation. 12 suppository 0   HYDROmorphone (DILAUDID) 2 MG tablet Take 1 tablet (2 mg total) by mouth every 6 (six) hours as needed for severe pain. 60 tablet 0   levothyroxine (SYNTHROID, LEVOTHROID) 175 MCG tablet Take 112 mcg by mouth.      LORazepam (ATIVAN) 0.5 MG tablet Take 1 tablet (0.5 mg total) by mouth at bedtime. 30 tablet 0   ondansetron (ZOFRAN) 8 MG tablet Take 1 tablet (8 mg total) by mouth every 8 (eight) hours as needed for nausea. 30 tablet 3   palbociclib (IBRANCE) 100 MG capsule Take 1 capsule (100 mg total) by mouth daily with breakfast. Take whole with food. Take for 21 days. (As directed by MD) 84 capsule 3   promethazine (PHENERGAN) 25 MG suppository Place 1 suppository (25 mg total) rectally every 6 (six) hours as needed for nausea or vomiting. 12 each 0   traMADol (ULTRAM) 50 MG tablet Take 1-2  tablets (50-100 mg total) by mouth every 6 (six) hours as needed. 30 tablet 0   vitamin C (ASCORBIC ACID) 250 MG tablet Take 1 tablet (250 mg total) by mouth daily.     zinc gluconate 50 MG tablet Take 1 tablet (50 mg total) by mouth daily.     No current facility-administered medications for this visit.    VITAL SIGNS: BP 114/69 (BP Location: Right Arm, Patient Position: Sitting)   Pulse (!) 101   Temp 97.6 F (36.4 C) (Oral)   Resp 18   SpO2 99%  There were no vitals filed for this visit.  Estimated body mass index is 21.95 kg/m as calculated from the following:   Height as of 01/08/22: _0  (1.778 m).   Weight as of 12/26/21: 153 lb (69.4 kg).  LABS: CBC:    Component Value Date/Time   WBC 6.5 01/08/2022 1410   WBC 6.6 08/07/2021 1622   HGB 13.2 01/08/2022 1410  HCT 38.8 01/08/2022 1410   PLT 271 01/08/2022 1410   MCV 86.6 01/08/2022 1410   NEUTROABS 5.3 01/08/2022 1410   LYMPHSABS 0.6 (L) 01/08/2022 1410   MONOABS 0.5 01/08/2022 1410   EOSABS 0.0 01/08/2022 1410   BASOSABS 0.0 01/08/2022 1410   Comprehensive Metabolic Panel:    Component Value Date/Time   NA 132 (L) 01/08/2022 1410   K 4.3 01/08/2022 1410   CL 102 01/08/2022 1410   CO2 20 (L) 01/08/2022 1410   BUN 12 01/08/2022 1410   CREATININE 0.62 01/08/2022 1410   GLUCOSE 105 (H) 01/08/2022 1410   CALCIUM 9.2 01/08/2022 1410   AST 135 (H) 01/08/2022 1410   ALT 18 01/08/2022 1410   ALKPHOS 179 (H) 01/08/2022 1410   BILITOT 0.6 01/08/2022 1410   PROT 7.4 01/08/2022 1410   ALBUMIN 4.1 01/08/2022 1410    RADIOGRAPHIC STUDIES: NM PET Image Initial (PI) Skull Base To Thigh  Result Date: 12/27/2021 CLINICAL DATA:  Initial treatment strategy for stage IV breast cancer. Lytic osseous lesions in the sacrum and lumbar spine on CT. EXAM: NUCLEAR MEDICINE PET SKULL BASE TO THIGH TECHNIQUE: 7.63 mCi F-18 FDG was injected intravenously. Full-ring PET imaging was performed from the skull base to thigh after the  radiotracer. CT data was obtained and used for attenuation correction and anatomic localization. Fasting blood glucose: 91 mg/dl COMPARISON:  CT lumbar spine 12/18/2021 FINDINGS: Mediastinal blood pool activity: SUV max 2.5 NECK: No hypermetabolic cervical lymph nodes are identified.Fairly symmetric activity within the lymphoid tissue of Waldeyer's ring is within physiologic limits.No suspicious activity identified within the pharyngeal mucosal space. There is focal hypermetabolic activity within the right thyroid lobe (SUV max 7.8). A discrete nodule or enlarged lymph node is not visualized in this area on the CT images. Incidental CT findings: none CHEST: 8 mm right axillary node on image 65/4 demonstrates mild hypermetabolic activity (SUV max 4.9). No other hypermetabolic mediastinal, hilar, axillary or internal mammary lymph nodes are seen. No hypermetabolic pulmonary activity or suspicious nodularity. Incidental CT findings: Dependent atelectasis or scarring at both lung bases without hypermetabolic activity. ABDOMEN/PELVIS: Focal hypermetabolic activity in the head of the pancreas is without CT correlate (SUV max 6.8). There is mild hypermetabolic activity within both adrenal glands (SUV max 4.4 on the right and 5.5 on the left). No hypermetabolic activity within the liver or spleen. There are small hypermetabolic retroperitoneal and pelvic lymph nodes. Representative nodes include a 9 mm right common iliac node on image 143/4 (SUV max 8.3) and a 1.2 cm right external iliac node on image 173/4 (SUV max 10.2). Incidental CT findings: Dominant cyst in the left hepatic lobe measuring up to 3.7 cm, without hypermetabolic activity. Minimal aortic atherosclerosis. SKELETON: Widespread osseous metastatic disease with involvement of the calvarium, spine, sternum, multiple ribs, the pelvis and the proximal appendicular skeleton. The large lytic mass involving the sacrum has an SUV max of 10.7. There are pathologic  fractures at T6 and T10. No gross epidural tumor. Incidental CT findings: Left breast implant. IMPRESSION: 1. Widespread osseous metastatic disease with the largest lytic lesion involving the sacrum. There are pathologic fractures at T6 and T10. 2. Small hypermetabolic lymph nodes in the retroperitoneum and pelvis consistent with nodal metastases. Single small hypermetabolic right axillary lymph node. 3. Focal hypermetabolic activity in the head of the pancreas without CT correlate. This could reflect a metastasis, adjacent lymph node or incidental pancreatic lesion. Recommend attention on follow-up. 4. Indeterminate hypermetabolic activity within both adrenal glands  without CT correlate, likely physiologic. 5. Hypermetabolic right thyroid lobe nodule versus adjacent lymph node. Given the patient's widespread osseous metastatic disease, this is unlikely to be clinically significant. Attention on follow-up recommended. Electronically Signed   By: Richardean Sale M.D.   On: 12/27/2021 11:53   PERFORMANCE STATUS (ECOG) : 3 - Symptomatic, >50% confined to bed  Review of Systems  Musculoskeletal:  Positive for arthralgias and back pain.  Neurological:  Positive for weakness.  Unless otherwise noted, a complete review of systems is negative.  Physical Exam General: appears uncomfortable, tearful, unable to sit in wheelchair  Cardiovascular: regular rate and rhythm Pulmonary: clear ant fields Abdomen: soft, nontender, + bowel sounds Extremities: no edema, no joint deformities Skin: no rashes Neurological:AAO x3, mood appropriate  IMPRESSION: This is my initial visit with Ms. Nahm.  Her caregiver is present with her.  She appears uncomfortable and requires assistance standing due to significant lower back sacral area pain.  She has multiple cushions in her wheelchair to offer support however she states this is not helpful after sitting for more than 15-20 minutes.  Alert and able to engage appropriately  in discussions.  I introduced myself, [place] RN, and Palliative's role in collaboration with the oncology team. Concept of Palliative Care was introduced as specialized medical care for people and their families living with serious illness.  It focuses on providing relief from the symptoms and stress of a serious illness.  The goal is to improve quality of life for both the patient and the family. Values and goals of care important to patient and family were attempted to be elicited.   Ms. Usman shares that she lives at home alone.  Her hired caregiver is with her 7 days a week for 10 hours/day.  Single.  No children.  She is a former [employer]er.  She has a sister who lives in [place].  Patient states she requires assistance with ADLs due to increased pain and fatigue.  Her caregiver is there mainly throughout the day to assist with errands and other household needs.  Ambulates in the home with a walker.  She endorses a fall approximately 3 months ago where she fell on her knees.  Audrea states that she is having increased anxiety due to her significant levels of pain and fatigue.  She was started on Ativan as needed.  Tolerating well and states she mainly only has to take once a day.  Appetite fluctuates.  She tries to drink Gatorade and at least 1 Ensure daily.  Feels her appetite is driven by her level of pain.  We discussed focusing on small frequent meals versus 3 large meals.  Also snacking when she has a desire.  Her current weight is 153 pounds.  She is able to sleep in her bed and does not have concerns for insomnia.  Neoplasm related pain Ms. Sassone endorses ongoing tailbone pain, joint pain, lower back pain, and right arm pain.  She sits at an angle and on multiple cushions to provide some relief of pressure.  At times due to severity she has to change positions.  We discussed her current regimen which consist of gabapentin 300 mg 3 times daily and she was recently  started on hydromorphone 2 mg every 6 hours as needed.  Patient states she is taking both medications as prescribed and requiring the hydromorphone around-the-clock.  Rates her pain 10/10 prior to taking medication.  When she takes hydromorphone states her pain will  decrease to 8/10.  She feels her pain is constant despite regimen.  We reviewed her allergies.  She has a high intolerance to oxycodone with significant nausea and vomiting.  Education provided on the goal of hopefully obtaining better pain control.  She understands we would not realistically be able to totally eliminate her pain but hopefully can decrease and improve her quality of life and comfort.  Education provided on the use of long-acting pain medication in conjunction with her hydromorphone as needed for breakthrough pain.  We will start her on MS Contin 15 mg every 12 hours.  I discussed frequency of medication, dosing, and potential side effects.  Patient and her caregiver verbalized understanding.  We will continue to closely monitor and adjust as needed.  Constipation Zamyah has occasional constipation.  Is currently taking Colace daily which has improved her bowel pattern.  Education provided on importance of bowel regimen in the setting of opioid use to prevent constipation.  She would like to continue taking her Colace daily however knows if she does not have a bowel movement for more than 48 hours she will need to begin taking daily MiraLAX.  Goals of care  We discussed her current illness and what it means in the larger context of her on-going co-morbidities. Natural disease trajectory and expectations were discussed.  Ms. Gibb is realistic in her understanding of her current illness.  She is remaining hopeful for some stability and improvement in her symptoms with current treatment with the understanding it is a palliative intent.  Clear and expressed wishes to continue to treat the treatable allow her every opportunity to  continue to thrive.  I discussed the importance of continued conversation with family and their medical providers regarding overall plan of care and treatment options, ensuring decisions are within the context of the patients values and GOCs.  PLAN: Established therapeutic relationship. Education provided on palliative's role in collaboration with their Oncology/Radiation team. Hydromorphone 2 mg every 6 hours as needed for breakthrough pain Continue gabapentin as prescribed 300 mg 3 times daily MS Contin 15 mg every 12 hours Colace daily.  Patient is aware if no bowel movement within 48 hours she will need to begin taking MiraLAX daily. We will continue to closely monitor symptoms. I will plan to see patient back in 1-2 weeks in collaboration to other oncology appointments.    Patient expressed understanding and was in agreement with this plan. She also understands that She can call the clinic at any time with any questions, concerns, or complaints.   Thank you for your referral and allowing Palliative to assist in Ms. Irvington care.   Number and complexity of problems addressed: HIGH - 1 or more chronic illnesses with SEVERE exacerbation, progression, or side effects of treatment - advanced cancer, pain. Any controlled substances utilized were prescribed in the context of palliative care.  Time Total: 55 min   Visit consisted of counseling and education dealing with the complex and emotionally intense issues of symptom management and palliative care in the setting of serious and potentially life-threatening illness.Greater than 50%  of this time was spent counseling and coordinating care related to the above assessment and plan.  Signed by: [name], AGPCNP-BC Palliative Medicine Team/[facility]

## 2022-01-16 ENCOUNTER — Other Ambulatory Visit: Payer: Self-pay

## 2022-01-16 ENCOUNTER — Ambulatory Visit
Admission: RE | Admit: 2022-01-16 | Discharge: 2022-01-16 | Disposition: A | Discharge status: Home / Self Care | Payer: Commercial Managed Care - PPO | Source: Ambulatory Visit | Attending: Radiation Oncology | Admitting: Radiation Oncology

## 2022-01-16 ENCOUNTER — Telehealth: Payer: Self-pay

## 2022-01-16 DIAGNOSIS — Z510 Encounter for antineoplastic radiation therapy: Secondary | ICD-10-CM | POA: Diagnosis not present

## 2022-01-16 LAB — RAD ONC ARIA SESSION SUMMARY
Course Elapsed Days: 10
Plan Fractions Treated to Date: 9
Plan Fractions Treated to Date: 9
Plan Prescribed Dose Per Fraction: 3 Gy
Plan Prescribed Dose Per Fraction: 3 Gy
Plan Total Fractions Prescribed: 10
Plan Total Fractions Prescribed: 10
Plan Total Prescribed Dose: 30 Gy
Plan Total Prescribed Dose: 30 Gy
Reference Point Dosage Given to Date: 27 Gy
Reference Point Dosage Given to Date: 27 Gy
Reference Point Session Dosage Given: 3 Gy
Reference Point Session Dosage Given: 3 Gy
Session Number: 9

## 2022-01-16 NOTE — Telephone Encounter (Signed)
Pt LVM regarding pain medication being unavailable from the pharmacy. This RN called the pharmacy, confirmed that medication was ready to be picked up. Attempted to call pt, LVM regarding updates and a callback number.

## 2022-01-19 ENCOUNTER — Ambulatory Visit
Admission: RE | Admit: 2022-01-19 | Discharge: 2022-01-19 | Disposition: A | Discharge status: Home / Self Care | Payer: Commercial Managed Care - PPO | Source: Ambulatory Visit | Attending: Radiation Oncology | Admitting: Radiation Oncology

## 2022-01-19 ENCOUNTER — Other Ambulatory Visit: Payer: Self-pay

## 2022-01-19 ENCOUNTER — Encounter: Payer: Self-pay | Admitting: Radiation Oncology

## 2022-01-19 ENCOUNTER — Inpatient Hospital Stay: Payer: Commercial Managed Care - PPO | Admitting: Nurse Practitioner

## 2022-01-19 DIAGNOSIS — Z510 Encounter for antineoplastic radiation therapy: Secondary | ICD-10-CM | POA: Diagnosis not present

## 2022-01-19 LAB — RAD ONC ARIA SESSION SUMMARY

## 2022-01-22 ENCOUNTER — Telehealth: Payer: Self-pay | Admitting: *Deleted

## 2022-01-22 NOTE — Telephone Encounter (Signed)
Received call from pt requesting work excuse from MD to excuse pt from work for the entire month of November.  RN educated pt that letter can not be provided and that pt will need to submit FMLA paperwork for MD to fill out.  Pt states she "has been on FMLA for several months under a different doctor and you need to add on to it".  RN educated pt that we can not adjust FMLA paperwork through another provider and that our office would need to start our own FMLA documentation.  Pt states she will contact the office back after speaking with her manager.

## 2022-01-23 ENCOUNTER — Inpatient Hospital Stay: Payer: Commercial Managed Care - PPO | Admitting: Nurse Practitioner

## 2022-01-23 NOTE — Progress Notes (Signed)
This palliative RN called the pt to check in and discuss how her pain levels have been since starting the MS Contin '15mg'$  q12hr. Pt reports her pain has improved but she is still having discomfort. Pain is on average a 6/10 after medications and is located in her leg/hip/lower back area. Pain is achy and sharp at times and increases with movement. Per Lexine Baton, NP pt is to increase MS Contin to 2 tabs ('30mg'$ ) q12hrs. Pt verbalized understanding of how to take medications and to call with any concerns. Pt also made aware of upcoming appointments. No further questions or concerns at this time.

## 2022-01-24 ENCOUNTER — Encounter: Payer: Self-pay | Admitting: Hematology and Oncology

## 2022-01-24 NOTE — Progress Notes (Signed)
Patient Care Team: Nicholas Lose, MD as PCP - General (Hematology and Oncology) Nicholas Lose, MD as Consulting Physician (Hematology and Oncology) Stark Klein, MD as Consulting Physician (General Surgery) Kyung Rudd, MD as Consulting Physician (Radiation Oncology)  DIAGNOSIS: No diagnosis found.  SUMMARY OF ONCOLOGIC HISTORY: Oncology History  Malignant neoplasm of lower-outer quadrant of left breast of female, estrogen receptor positive ([place])  06/11/2016 Mammogram   Palpable left breast masses 3:00 position: 2.2 cm; 5:30 position: 2.5 cm; 6:30 position: 0.7 cm   06/19/2016 Initial Diagnosis   Left breast biopsy 3:30: IDC with DCIS grade 1, ER 90%, PR 50%, Ki-67 15%, HER-2 negative ratio 1.13; biopsy 5:30 position: IDC grade 1   07/13/2016 Breast MRI   Large area of abnormal enhancement lower inner and lower outer quadrants left breast spanning 9 cm x 6.4 cm x 5.3 cm, no abnormal enlarged lymph nodes; T3 N0 stage II a (New AJCC staging)    07/15/2016 - 12/08/2017 Anti-estrogen oral therapy   Neoadjuvant anastrozole 1 mg daily   07/17/2016 Oncotype testing   Testing done on the biopsy: Oncotype DX score 22, intermediate risk   02/02/2017 Breast MRI   Left breast multicentric disease unchanged measuring 2.7 x 1.6 cm.  Mass in the non-mass enhancement are also not significantly changed measuring 6.2 x 2.4 cm. new enhancing mass within the outer right breast 7 mm which could be fat necrosis or inclusion cyst    02/09/2017 Imaging   Ultrasound of the right breast lesion noted on MRI: No sonographic finding corresponds to the abnormality noted on MRI   07/13/2017 Cancer Staging   Staging form: Breast, AJCC 8th Edition - Clinical stage from 07/13/2017: Stage IIA (cT3, cN0, cM0, G1, ER+, PR+, HER2-) - Signed by Gardenia Phlegm, NP on 05/18/2018   10/26/2017 Surgery   Left mastectomy: IDC grade 1, 2 foci largest spans 8.5 cm, intermediate grade DCIS, lymphovascular invasion  identified, perineural invasion identified, 1/2 lymph nodes positive with extracapsular extension, ER 9200%, PR 5 to 50%, HER-2 negative, Ki-67 10 to 15%, T3N1A Mammaprint: low risk   11/02/2017 Cancer Staging   Staging form: Breast, AJCC 8th Edition - Pathologic: No Stage Recommended (ypT3, pN1a, cM0, G1, ER+, PR+, HER2-) - Signed by Nicholas Lose, MD on 11/02/2017   12/08/2017 - 01/26/2018 Radiation Therapy   Adjuvant radiation therapy    02/2018 -  Anti-estrogen oral therapy   Anastrozole 1 mg daily adjuvant therapy     CHIEF COMPLIANT:Follow-up left breast cancer Ibrance with Faslodex and Zometa, palliative radiation   INTERVAL HISTORY: Kathryn Lucas is a a 61 y.o. with above-mentioned history of left breast cancer treated with mastectomy, radiation therapy, and is currently on anti-estrogen therapy with anastrozole. She presents to the clinic for a follow-up.    ALLERGIES:  is allergic to percocet [oxycodone-acetaminophen].  MEDICATIONS:  Current Outpatient Medications  Medication Sig Dispense Refill   anastrozole (ARIMIDEX) 1 MG tablet TAKE ONE TABLET BY MOUTH DAILY 90 tablet 3   Calcium 500-100 MG-UNIT CHEW Chew 1 tablet by mouth daily. 60 tablet    cholecalciferol (VITAMIN D3) 25 MCG (1000 UNIT) tablet Take 1 tablet (1,000 Units total) by mouth daily.     gabapentin (NEURONTIN) 300 MG capsule Take 300 mg by mouth 3 (three) times daily.     glycerin adult 2 g suppository Place 1 suppository rectally as needed for constipation. 12 suppository 0   HYDROmorphone (DILAUDID) 2 MG tablet Take 1 tablet (2 mg total) by mouth  every 6 (six) hours as needed for severe pain. 60 tablet 0   levothyroxine (SYNTHROID, LEVOTHROID) 175 MCG tablet Take 112 mcg by mouth.      LORazepam (ATIVAN) 0.5 MG tablet Take 1 tablet (0.5 mg total) by mouth at bedtime. 30 tablet 0   morphine (MS CONTIN) 15 MG 12 hr tablet Take 1 tablet (15 mg total) by mouth every 12 (twelve) hours. 30 tablet 0   ondansetron  (ZOFRAN) 8 MG tablet Take 1 tablet (8 mg total) by mouth every 8 (eight) hours as needed for nausea. 30 tablet 3   palbociclib (IBRANCE) 100 MG capsule Take 1 capsule (100 mg total) by mouth daily with breakfast. Take whole with food. Take for 21 days. (As directed by MD) 84 capsule 3   promethazine (PHENERGAN) 25 MG suppository Place 1 suppository (25 mg total) rectally every 6 (six) hours as needed for nausea or vomiting. 12 each 0   traMADol (ULTRAM) 50 MG tablet Take 1-2 tablets (50-100 mg total) by mouth every 6 (six) hours as needed. 30 tablet 0   vitamin C (ASCORBIC ACID) 250 MG tablet Take 1 tablet (250 mg total) by mouth daily.     zinc gluconate 50 MG tablet Take 1 tablet (50 mg total) by mouth daily.     No current facility-administered medications for this visit.    PHYSICAL EXAMINATION: ECOG PERFORMANCE STATUS: {CHL ONC ECOG PS:[id number]}  There were no vitals filed for this visit. There were no vitals filed for this visit.  BREAST:*** No palpable masses or nodules in either right or left breasts. No palpable axillary supraclavicular or infraclavicular adenopathy no breast tenderness or nipple discharge. (exam performed in the presence of a chaperone)  LABORATORY DATA:  I have reviewed the data as listed    Latest Ref Rng & Units 01/08/2022    2:10 PM 01/02/2022   12:12 PM 12/25/2021    2:10 PM  CMP  Glucose 70 - 99 mg/dL 105  99  147   BUN 6 - 20 mg/dL _0 Creatinine 0.44 - 1.00 mg/dL 0.62  0.72  0.96   Sodium 135 - 145 mmol/L 132  136  134   Potassium 3.5 - 5.1 mmol/L 4.3  4.5  3.4   Chloride 98 - 111 mmol/L 102  106  96   CO2 22 - 32 mmol/L _1 Calcium 8.9 - 10.3 mg/dL 9.2  8.4  11.6   Total Protein 6.5 - 8.1 g/dL 7.4  6.9  7.2   Total Bilirubin 0.3 - 1.2 mg/dL 0.6  0.4  0.9   Alkaline Phos 38 - 126 U/L 179  135  131   AST 15 - 41 U/L 135  105  236   ALT 0 - 44 U/L 18  25  112     Lab Results  Component Value Date   WBC 6.5 01/08/2022    HGB 13.2 01/08/2022   HCT 38.8 01/08/2022   MCV 86.6 01/08/2022   PLT 271 01/08/2022   NEUTROABS 5.3 01/08/2022    ASSESSMENT & PLAN:  No problem-specific Assessment & Plan notes found for this encounter.    No orders of the defined types were placed in this encounter.  The patient has a good understanding of the overall plan. she agrees with it. she will call with any problems that may develop before the next visit here. Total time spent: 30 mins including face to face  time and time spent for planning, charting and co-ordination of care   Suzzette Righter, [place] 01/24/22    I Gardiner Coins am scribing for Dr. Lindi Adie  ***

## 2022-01-26 ENCOUNTER — Inpatient Hospital Stay: Payer: Commercial Managed Care - PPO

## 2022-01-26 ENCOUNTER — Encounter: Payer: Self-pay | Admitting: Hematology and Oncology

## 2022-01-26 ENCOUNTER — Inpatient Hospital Stay: Payer: Commercial Managed Care - PPO | Admitting: Hematology and Oncology

## 2022-01-26 ENCOUNTER — Other Ambulatory Visit: Payer: Self-pay

## 2022-01-26 VITALS — BP 109/87 | HR 102 | Temp 97.7°F | Resp 18 | Ht 70.0 in | Wt 150.2 lb

## 2022-01-26 DIAGNOSIS — Z170 Estrogen receptor positive status [ER+]: Secondary | ICD-10-CM | POA: Diagnosis not present

## 2022-01-26 DIAGNOSIS — C50512 Malignant neoplasm of lower-outer quadrant of left female breast: Secondary | ICD-10-CM

## 2022-01-26 DIAGNOSIS — C799 Secondary malignant neoplasm of unspecified site: Secondary | ICD-10-CM

## 2022-01-26 DIAGNOSIS — Z5111 Encounter for antineoplastic chemotherapy: Secondary | ICD-10-CM | POA: Diagnosis not present

## 2022-01-26 LAB — CBC WITH DIFFERENTIAL (CANCER CENTER ONLY)
Abs Immature Granulocytes: 0.02 10*3/uL (ref 0.00–0.07)
Basophils Absolute: 0 10*3/uL (ref 0.0–0.1)
Basophils Relative: 2 %
Eosinophils Absolute: 0 10*3/uL (ref 0.0–0.5)
Eosinophils Relative: 3 %
HCT: 36.9 % (ref 36.0–46.0)
Hemoglobin: 12.7 g/dL (ref 12.0–15.0)
Immature Granulocytes: 2 %
Lymphocytes Relative: 11 %
Lymphs Abs: 0.1 10*3/uL — ABNORMAL LOW (ref 0.7–4.0)
MCH: 30.7 pg (ref 26.0–34.0)
MCHC: 34.4 g/dL (ref 30.0–36.0)
MCV: 89.1 fL (ref 80.0–100.0)
Monocytes Absolute: 0.1 10*3/uL (ref 0.1–1.0)
Monocytes Relative: 8 %
Neutro Abs: 0.8 10*3/uL — ABNORMAL LOW (ref 1.7–7.7)
Neutrophils Relative %: 74 %
Platelet Count: 114 10*3/uL — ABNORMAL LOW (ref 150–400)
RBC: 4.14 MIL/uL (ref 3.87–5.11)
RDW: 17.7 % — ABNORMAL HIGH (ref 11.5–15.5)
WBC Count: 1.1 10*3/uL — ABNORMAL LOW (ref 4.0–10.5)
nRBC: 0 % (ref 0.0–0.2)

## 2022-01-26 LAB — CMP (CANCER CENTER ONLY)
ALT: 10 U/L (ref 0–44)
AST: 64 U/L — ABNORMAL HIGH (ref 15–41)
Albumin: 3.7 g/dL (ref 3.5–5.0)
Alkaline Phosphatase: 119 U/L (ref 38–126)
Anion gap: 6 (ref 5–15)
BUN: 13 mg/dL (ref 6–20)
CO2: 24 mmol/L (ref 22–32)
Calcium: 8.4 mg/dL — ABNORMAL LOW (ref 8.9–10.3)
Chloride: 100 mmol/L (ref 98–111)
Creatinine: 0.67 mg/dL (ref 0.44–1.00)
GFR, Estimated: >60 mL/min (ref >60)
Glucose, Bld: 112 mg/dL — ABNORMAL HIGH (ref 70–99)
Potassium: 3.7 mmol/L (ref 3.5–5.1)
Sodium: 130 mmol/L — ABNORMAL LOW (ref 135–145)
Total Bilirubin: 0.6 mg/dL (ref 0.3–1.2)
Total Protein: 6.7 g/dL (ref 6.5–8.1)

## 2022-01-26 MED ORDER — FULVESTRANT 250 MG/5ML IM SOSY
500.0000 mg | PREFILLED_SYRINGE | Freq: Once | INTRAMUSCULAR | Status: AC
  Administered 2022-01-26: 500 mg via INTRAMUSCULAR

## 2022-01-26 MED ORDER — ANASTROZOLE 1 MG PO TABS: 1.0000 mg | ORAL_TABLET | Freq: Every day | ORAL | 3 refills | Status: DC

## 2022-01-26 MED ORDER — PALBOCICLIB 75 MG PO CAPS: 75.0000 mg | ORAL_CAPSULE | Freq: Every day | ORAL | Status: DC

## 2022-01-26 NOTE — Progress Notes (Signed)
                                                                                                                                                             Patient Name: Kathryn Lucas MRN: [id number] DOB: 27-Jan-1961 Referring Physician: Nicholas Lose (Profile Not Attached) Date of Service: 01/19/2022 [facility] [place]                                                        End Of Treatment Note  Diagnoses: C79.51-Secondary malignant neoplasm of bone  Cancer Staging:  Recurrent Metastatic Stage IIA, cT3N0M0, pT3N1aM0, grade 1, ER/PR positive, invasive ductal carcinoma of the left breast with bony metastases.   Intent: Curative  Radiation Treatment Dates: 01/06/2022 through 01/19/2022 Site Technique Total Dose (Gy) Dose per Fx (Gy) Completed Fx Beam Energies  Sacrum: Spine_Sacrum 3D 30/30 3 10/10 10X, 15X  Thoracic Spine: Spine_T 3D 30/30 3 10/10 10X, 15X   Narrative: The patient tolerated radiation therapy relatively well. She was fatigued and continued to need pain medication at the conclusion of therapy.   Plan: The patient will receive a call in about one month from the radiation oncology department. She will continue follow up with Dr. Lindi Adie as well.   ________________________________________________    Carola Rhine, [facility]

## 2022-01-26 NOTE — Assessment & Plan Note (Signed)
06/19/2016 Left breast biopsy 3:30: IDC with DCIS grade 1, ER 90%, PR 50%, Ki-67 15%, HER-2 negative ratio 1.13; biopsy 5:30 position: IDC grade 1   10/26/17: Left mastectomy: IDC grade 1, 2 foci largest spans 8.5 cm, intermediate grade DCIS, lymphovascular invasion identified, perineural invasion identified, 1/2 lymph nodes positive with extracapsular extension, ER 9200%, PR 5 to 50%, HER-2 negative, Ki-67 10 to 15%, T3N1A   Oncotype DX score 22, intermediate risk, chemotherapy not felt to have significant benefit.   Treatment Summary: 1. Antiestrogen therapy with anastrozole 1 mg daily started 07/15/2016 2. Mastectomy 10/26/2017, Mammaprint low risk luminal type A 3. Followed by adjuvant radiation 12/08/17- 01/26/18  4. Followed by adjuvant antiestrogen therapy anastrozole started 01/17/2018 (originally started 07/15/2016) -------------------------------------------------------------------- Low back pain August 2023: Underwent CT myelogram: Large expansile lesion in the sacrum with extraosseous extension of the tumor, diffuse lytic lesions throughout the visualized spine with metastatic disease myeloma is considered less likely.  (This was ordered by Dr. Melrose Nakayama)   Treatment plan: 1.  PET CT scan 12/27/2021: Widespread bone metastatic disease largest lesion involving the sacrum with pathological fractures of T6 and T10 retroperitoneal and pelvic lymph node metastasis, right axillary lymph node, activity in the pancreatic head, hypermetabolic activity in the adrenal glands, right thyroid nodule. 2. biopsy of sacrum: 12/26/2021: Metastatic breast cancer, ER 90%, PR 10%, HER2 negative (0) 3.  Treatment plan: Ibrance along with Faslodex started 12/25/2021 4.  Zometa for bone metastases.  Every 3 months 5.  Palliative radiation to the sacrum completed 01/19/2022 Genetic testing for BRCA  analysis. -------------------------------------------------------------------------------------------------------------------------------------- Current treatment: Ibrance with Faslodex and Zometa, started 12/25/2021 Toxicities:  Severe bone pain: I sent a prescription for Dilaudid. Difficulty with sleeping: Sent a prescription for lorazepam.   Return to clinic in 4 weeks for injection and follow-up with labs

## 2022-01-28 ENCOUNTER — Other Ambulatory Visit: Payer: Self-pay | Admitting: *Deleted

## 2022-01-28 DIAGNOSIS — C50512 Malignant neoplasm of lower-outer quadrant of left female breast: Secondary | ICD-10-CM

## 2022-01-28 MED ORDER — MAGIC MOUTHWASH W/LIDOCAINE: 5.0000 mL | Freq: Three times a day (TID) | ORAL | 1 refills | Status: DC

## 2022-01-28 MED ORDER — PALBOCICLIB 75 MG PO CAPS: 75.0000 mg | ORAL_CAPSULE | Freq: Every day | ORAL | 0 refills | Status: DC

## 2022-01-28 NOTE — Progress Notes (Signed)
Received call from pt with complaint of redness in mouth and sore throat.  Pt states she is able to eat but only soft foods at this time.  Pt denies white spots or sores in the mouth at this time.  Per MD pt needing to be prescribed Magic Mouth Wash.  Prescription sent to the pharmacy on file.  Pt educated and verbalized understanding.

## 2022-01-29 ENCOUNTER — Telehealth: Payer: Self-pay

## 2022-01-29 NOTE — Telephone Encounter (Signed)
Oral Oncology Pharmacist Encounter  Received a call from biologics to clarify the Ibrance prescription. Clarified with MD that patient is taking 21 days on, 7 days off. Called biologics back to clarify prescription.  Drema Halon, PharmD Hematology/Oncology Clinical Pharmacist [facility] [phone]

## 2022-02-04 ENCOUNTER — Inpatient Hospital Stay (HOSPITAL_BASED_OUTPATIENT_CLINIC_OR_DEPARTMENT_OTHER): Payer: Commercial Managed Care - PPO | Admitting: Nurse Practitioner

## 2022-02-04 ENCOUNTER — Encounter: Payer: Self-pay | Admitting: Nurse Practitioner

## 2022-02-04 DIAGNOSIS — R530 Neoplastic (malignant) related fatigue: Secondary | ICD-10-CM

## 2022-02-04 DIAGNOSIS — Z515 Encounter for palliative care: Secondary | ICD-10-CM | POA: Diagnosis not present

## 2022-02-04 DIAGNOSIS — C50512 Malignant neoplasm of lower-outer quadrant of left female breast: Secondary | ICD-10-CM | POA: Diagnosis not present

## 2022-02-04 DIAGNOSIS — Z170 Estrogen receptor positive status [ER+]: Secondary | ICD-10-CM

## 2022-02-04 DIAGNOSIS — G893 Neoplasm related pain (acute) (chronic): Secondary | ICD-10-CM

## 2022-02-04 DIAGNOSIS — Z5111 Encounter for antineoplastic chemotherapy: Secondary | ICD-10-CM | POA: Diagnosis not present

## 2022-02-04 DIAGNOSIS — K5900 Constipation, unspecified: Secondary | ICD-10-CM

## 2022-02-04 NOTE — Progress Notes (Signed)
[place]  [phone]) [phone] Fax:([phone]) [phone]   Name: Kathryn Lucas Date: 02/04/2022 MRN: [id number]  DOB: 1960-12-11  Patient Care Team: Nicholas Lose, MD as PCP - General (Hematology and Oncology) Nicholas Lose, MD as Consulting Physician (Hematology and Oncology) Stark Klein, MD as Consulting Physician (General Surgery) Kyung Rudd, MD as Consulting Physician (Radiation Oncology)   I connected with Kathryn Lucas on 02/04/22 at 11:30 AM EST by phone and verified that I am speaking with the correct person using two identifiers.   I discussed the limitations, risks, security and privacy concerns of performing an evaluation and management service by telemedicine and the availability of in-person appointments. I also discussed with the patient that there may be a patient responsible charge related to this service. The patient expressed understanding and agreed to proceed.   Other persons participating in the visit and their role in the encounter: [place], RN   Patient's location: Home  Provider's location: [place] HISTORY: Kathryn Lucas is a 61 y.o. 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 Recent diagnosis (10/2021) of large sacral lesion with diffuse lytic lesions throughout the spine. Palliative ask to see for symptom management and goals of care.   SOCIAL HISTORY:     reports that she has never smoked. She has never used smokeless tobacco. She reports current alcohol use. She reports that she does not use drugs.  ADVANCE DIRECTIVES:    CODE STATUS:   PAST MEDICAL HISTORY: Past Medical History:  Diagnosis Date   Breast cancer ([place])    left breast cancer   Cancer ([place]) 09/2017   left breast cancer   Family history of breast cancer    Hypothyroidism    Personal history of radiation therapy 2019   Thyroid disease      ALLERGIES:  is allergic to percocet [oxycodone-acetaminophen].  MEDICATIONS:  Current Outpatient Medications  Medication Sig Dispense Refill   anastrozole (ARIMIDEX) 1 MG tablet Take 1 tablet (1 mg total) by mouth daily. 90 tablet 3   Calcium 500-100 MG-UNIT CHEW Chew 1 tablet by mouth daily. 60 tablet    cholecalciferol (VITAMIN D3) 25 MCG (1000 UNIT) tablet Take 1 tablet (1,000 Units total) by mouth daily.     gabapentin (NEURONTIN) 300 MG capsule Take 300 mg by mouth 3 (three) times daily.     glycerin adult 2 g suppository Place 1 suppository rectally as needed for constipation. 12 suppository 0   HYDROmorphone (DILAUDID) 2 MG tablet Take 1 tablet (2 mg total) by mouth every 6 (six) hours as needed for severe pain. 60 tablet 0   levothyroxine (SYNTHROID, LEVOTHROID) 175 MCG tablet Take 112 mcg by mouth.      LIDOCAINE PAIN RELIEF 4 % SMARTSIG:Topical     LORazepam (ATIVAN) 0.5 MG tablet Take 1 tablet (0.5 mg total) by mouth at bedtime. 30 tablet 0   magic mouthwash w/lidocaine SOLN Take 5 mLs by mouth 3 (three) times daily. 240 mL 1   morphine (MS CONTIN) 15 MG 12 hr tablet Take 1 tablet (15 mg total) by mouth every 12 (twelve) hours. 30 tablet 0   ondansetron (ZOFRAN) 8 MG tablet Take 1 tablet (8 mg total) by mouth every 8 (eight) hours as needed for nausea. 30 tablet 3   palbociclib (IBRANCE) 75 MG capsule Take 1 capsule (75 mg total) by mouth daily with breakfast.  Take whole with food. Take for 21 days. (As directed by MD) 21 capsule 0   promethazine (PHENERGAN) 25 MG suppository Place 1 suppository (25 mg total) rectally every 6 (six) hours as needed for nausea or vomiting. 12 each 0   traMADol (ULTRAM) 50 MG tablet Take 1-2 tablets (50-100 mg total) by mouth every 6 (six) hours as needed. 30 tablet 0   vitamin C (ASCORBIC ACID) 250 MG tablet Take 1 tablet (250 mg total) by mouth daily.     zinc gluconate 50 MG tablet Take 1 tablet (50 mg total) by mouth daily.     No current  facility-administered medications for this visit.    VITAL SIGNS: There were no vitals taken for this visit. There were no vitals filed for this visit.  Estimated body mass index is 21.55 kg/m as calculated from the following:   Height as of 01/26/22: '5\' 10"'$  (1.778 m).   Weight as of 01/26/22: 150 lb 3.2 oz (68.1 kg).   PERFORMANCE STATUS (ECOG) : 1 - Symptomatic but completely ambulatory   IMPRESSION:  I spoke with Kathryn Lucas for follow-up on symptom management.  No acute distress identified.  States she is trying to remain as active as possible.  Appetite is fair.  She and her sister are planning on having guests for the upcoming Thanksgiving holidays.  1.  Neoplasm related pain Kathryn Lucas states her pain is well-controlled.  She is able to tolerate increased activity.  We discussed her current regimen at length.  She is not consistently taking her MS Contin.  We discussed if she is not having significant pain we can discontinue this medication.  Hydromorphone as needed for breakthrough pain.  She is actively taking this medication which gives her some relief.  We will continue to closely monitor and adjust medications as needed.  2.  Constipation Much improved with daily regimen.   PLAN: Hydromorphone 2 mg as needed for breakthrough MS Contin '15mg'$  every 12 hrs. Has not been taking consistently. Will closely monitor for need.  Colace daily I will plan to see patient back 3-4 weeks in collaboration to other oncology appointments.    Patient expressed understanding and was in agreement with this plan. She also understands that She can call the clinic at any time with any questions, concerns, or complaints.   Any controlled substances utilized were prescribed in the context of palliative care. PDMP has been reviewed.   Time Total: 45 min   Visit consisted of counseling and education dealing with the complex and emotionally intense issues of symptom management and palliative care in  the setting of serious and potentially life-threatening illness.Greater than 50%  of this time was spent counseling and coordinating care related to the above assessment and plan.  [name], AGPCNP-BC  Palliative Medicine Team/[place]

## 2022-02-16 ENCOUNTER — Telehealth: Payer: Self-pay

## 2022-02-16 NOTE — Telephone Encounter (Signed)
Oral Oncology Pharmacist Encounter  Received notification from RN, Merleen Nicely, that patient had some questions about the Ibrance '75mg'$ . Patient started the '75mg'$  on thanksgiving day, 02/05/22 and has been taking since. She does state that she had a 10 day off period from the [place] '100mg'$  to starting the '75mg'$ . Patient notified to continue the Ibrance '75mg'$  for the 21 days on, 7 days off as she did have an off period prior to this cycle. Patient has appointment with Dr. Lindi Adie on 02/23/22 for labs and visit and Dr. Lindi Adie will see how patient is doing at that appointment. Patient was thankful for the call back and agrees that she will continue for the 21 days and will see Dr. Lindi Adie next week Monday.   Drema Halon, PharmD Hematology/Oncology Clinical Pharmacist [facility] [phone]

## 2022-02-17 ENCOUNTER — Telehealth: Payer: Self-pay | Admitting: *Deleted

## 2022-02-17 NOTE — Telephone Encounter (Signed)
Received call from pt with complaint of constipation x 4 days.  Pt educated on OTC milk of magnesium as well as glycerin suppository.  Pt also educated on increasing oral fluid intake.  Pt verbalized understanding.

## 2022-02-18 ENCOUNTER — Other Ambulatory Visit: Payer: Self-pay | Admitting: *Deleted

## 2022-02-18 DIAGNOSIS — Z170 Estrogen receptor positive status [ER+]: Secondary | ICD-10-CM

## 2022-02-20 ENCOUNTER — Telehealth: Payer: Self-pay

## 2022-02-20 NOTE — Telephone Encounter (Signed)
Oral Oncology Pharmacist Encounter  Received notification from RN that patient couldn't remember how we discussed to take the Imodium for diarrhea. She is having some due to the [place]. I informed patient on correct usage of Imodium and she appreciated and stated she understood how to take it.  Drema Halon, PharmD Hematology/Oncology Clinical Pharmacist [facility] [phone]

## 2022-02-20 NOTE — Progress Notes (Signed)
Patient Care Team: Nicholas Lose, MD as PCP - General (Hematology and Oncology) Nicholas Lose, MD as Consulting Physician (Hematology and Oncology) Stark Klein, MD as Consulting Physician (General Surgery) Kyung Rudd, MD as Consulting Physician (Radiation Oncology)  DIAGNOSIS: No diagnosis found.  SUMMARY OF ONCOLOGIC HISTORY: Oncology History  Malignant neoplasm of lower-outer quadrant of left breast of female, estrogen receptor positive ([place])  06/11/2016 Mammogram   Palpable left breast masses 3:00 position: 2.2 cm; 5:30 position: 2.5 cm; 6:30 position: 0.7 cm   06/19/2016 Initial Diagnosis   Left breast biopsy 3:30: IDC with DCIS grade 1, ER 90%, PR 50%, Ki-67 15%, HER-2 negative ratio 1.13; biopsy 5:30 position: IDC grade 1   07/13/2016 Breast MRI   Large area of abnormal enhancement lower inner and lower outer quadrants left breast spanning 9 cm x 6.4 cm x 5.3 cm, no abnormal enlarged lymph nodes; T3 N0 stage II a (New AJCC staging)    07/15/2016 - 12/08/2017 Anti-estrogen oral therapy   Neoadjuvant anastrozole 1 mg daily   07/17/2016 Oncotype testing   Testing done on the biopsy: Oncotype DX score 22, intermediate risk   02/02/2017 Breast MRI   Left breast multicentric disease unchanged measuring 2.7 x 1.6 cm.  Mass in the non-mass enhancement are also not significantly changed measuring 6.2 x 2.4 cm. new enhancing mass within the outer right breast 7 mm which could be fat necrosis or inclusion cyst    02/09/2017 Imaging   Ultrasound of the right breast lesion noted on MRI: No sonographic finding corresponds to the abnormality noted on MRI   07/13/2017 Cancer Staging   Staging form: Breast, AJCC 8th Edition - Clinical stage from 07/13/2017: Stage IIA (cT3, cN0, cM0, G1, ER+, PR+, HER2-) - Signed by Gardenia Phlegm, NP on 05/18/2018   10/26/2017 Surgery   Left mastectomy: IDC grade 1, 2 foci largest spans 8.5 cm, intermediate grade DCIS, lymphovascular invasion  identified, perineural invasion identified, 1/2 lymph nodes positive with extracapsular extension, ER 9200%, PR 5 to 50%, HER-2 negative, Ki-67 10 to 15%, T3N1A Mammaprint: low risk   11/02/2017 Cancer Staging   Staging form: Breast, AJCC 8th Edition - Pathologic: No Stage Recommended (ypT3, pN1a, cM0, G1, ER+, PR+, HER2-) - Signed by Nicholas Lose, MD on 11/02/2017   12/08/2017 - 01/26/2018 Radiation Therapy   Adjuvant radiation therapy    02/2018 -  Anti-estrogen oral therapy   Anastrozole 1 mg daily adjuvant therapy     CHIEF COMPLIANT:   INTERVAL HISTORY: SILENA WYSS is a   ALLERGIES:  is allergic to percocet [oxycodone-acetaminophen].  MEDICATIONS:  Current Outpatient Medications  Medication Sig Dispense Refill   anastrozole (ARIMIDEX) 1 MG tablet Take 1 tablet (1 mg total) by mouth daily. 90 tablet 3   Calcium 500-100 MG-UNIT CHEW Chew 1 tablet by mouth daily. 60 tablet    cholecalciferol (VITAMIN D3) 25 MCG (1000 UNIT) tablet Take 1 tablet (1,000 Units total) by mouth daily.     gabapentin (NEURONTIN) 300 MG capsule Take 300 mg by mouth 3 (three) times daily.     glycerin adult 2 g suppository Place 1 suppository rectally as needed for constipation. 12 suppository 0   HYDROmorphone (DILAUDID) 2 MG tablet Take 1 tablet (2 mg total) by mouth every 6 (six) hours as needed for severe pain. 60 tablet 0   levothyroxine (SYNTHROID, LEVOTHROID) 175 MCG tablet Take 112 mcg by mouth.      LIDOCAINE PAIN RELIEF 4 % SMARTSIG:Topical  LORazepam (ATIVAN) 0.5 MG tablet Take 1 tablet (0.5 mg total) by mouth at bedtime. 30 tablet 0   magic mouthwash w/lidocaine SOLN Take 5 mLs by mouth 3 (three) times daily. 240 mL 1   morphine (MS CONTIN) 15 MG 12 hr tablet Take 1 tablet (15 mg total) by mouth every 12 (twelve) hours. 30 tablet 0   ondansetron (ZOFRAN) 8 MG tablet Take 1 tablet (8 mg total) by mouth every 8 (eight) hours as needed for nausea. 30 tablet 3   palbociclib (IBRANCE) 75 MG  capsule Take 1 capsule (75 mg total) by mouth daily with breakfast. Take whole with food. Take for 21 days. (As directed by MD) 21 capsule 0   promethazine (PHENERGAN) 25 MG suppository Place 1 suppository (25 mg total) rectally every 6 (six) hours as needed for nausea or vomiting. 12 each 0   traMADol (ULTRAM) 50 MG tablet Take 1-2 tablets (50-100 mg total) by mouth every 6 (six) hours as needed. 30 tablet 0   vitamin C (ASCORBIC ACID) 250 MG tablet Take 1 tablet (250 mg total) by mouth daily.     zinc gluconate 50 MG tablet Take 1 tablet (50 mg total) by mouth daily.     No current facility-administered medications for this visit.    PHYSICAL EXAMINATION: ECOG PERFORMANCE STATUS: {CHL ONC ECOG [phone]}  There were no vitals filed for this visit. There were no vitals filed for this visit.  BREAST:*** No palpable masses or nodules in either right or left breasts. No palpable axillary supraclavicular or infraclavicular adenopathy no breast tenderness or nipple discharge. (exam performed in the presence of a chaperone)  LABORATORY DATA:  I have reviewed the data as listed    Latest Ref Rng & Units 01/26/2022    2:50 PM 01/08/2022    2:10 PM 01/02/2022   12:12 PM  CMP  Glucose 70 - 99 mg/dL 112  105  99   BUN 6 - 20 mg/dL _0 Creatinine 0.44 - 1.00 mg/dL 0.67  0.62  0.72   Sodium 135 - 145 mmol/L 130  132  136   Potassium 3.5 - 5.1 mmol/L 3.7  4.3  4.5   Chloride 98 - 111 mmol/L 100  102  106   CO2 22 - 32 mmol/L _1 Calcium 8.9 - 10.3 mg/dL 8.4  9.2  8.4   Total Protein 6.5 - 8.1 g/dL 6.7  7.4  6.9   Total Bilirubin 0.3 - 1.2 mg/dL 0.6  0.6  0.4   Alkaline Phos 38 - 126 U/L 119  179  135   AST 15 - 41 U/L 64  135  105   ALT 0 - 44 U/L _2 Lab Results  Component Value Date   WBC 1.1 (L) 01/26/2022   HGB 12.7 01/26/2022   HCT 36.9 01/26/2022   MCV 89.1 01/26/2022   PLT 114 (L) 01/26/2022   NEUTROABS 0.8 (L) 01/26/2022    ASSESSMENT &  PLAN:  No problem-specific Assessment & Plan notes found for this encounter.    No orders of the defined types were placed in this encounter.  The patient has a good understanding of the overall plan. she agrees with it. she will call with any problems that may develop before the next visit here. Total time spent: 30 mins including face to face time and time spent for planning, charting and co-ordination  of care   Suzzette Righter, [place] 02/20/22    I Gardiner Coins am scribing for Dr. Lindi Adie  ***

## 2022-02-23 ENCOUNTER — Inpatient Hospital Stay: Payer: Commercial Managed Care - PPO | Attending: Hematology and Oncology

## 2022-02-23 ENCOUNTER — Inpatient Hospital Stay: Payer: Commercial Managed Care - PPO | Admitting: Nurse Practitioner

## 2022-02-23 ENCOUNTER — Inpatient Hospital Stay: Payer: Commercial Managed Care - PPO

## 2022-02-23 ENCOUNTER — Inpatient Hospital Stay: Payer: Commercial Managed Care - PPO | Admitting: Hematology and Oncology

## 2022-02-23 ENCOUNTER — Encounter: Payer: Self-pay | Admitting: Hematology and Oncology

## 2022-02-23 ENCOUNTER — Other Ambulatory Visit: Payer: Self-pay

## 2022-02-23 ENCOUNTER — Ambulatory Visit (HOSPITAL_COMMUNITY)
Admission: RE | Admit: 2022-02-23 | Discharge: 2022-02-23 | Disposition: A | Discharge status: Home / Self Care | Payer: Commercial Managed Care - PPO | Source: Ambulatory Visit | Attending: Hematology and Oncology | Admitting: Hematology and Oncology

## 2022-02-23 VITALS — BP 114/77

## 2022-02-23 VITALS — BP 128/92 | HR 116 | Temp 97.5°F | Resp 18 | Ht 70.0 in | Wt 142.6 lb

## 2022-02-23 DIAGNOSIS — Z170 Estrogen receptor positive status [ER+]: Secondary | ICD-10-CM

## 2022-02-23 DIAGNOSIS — Z79891 Long term (current) use of opiate analgesic: Secondary | ICD-10-CM | POA: Diagnosis not present

## 2022-02-23 DIAGNOSIS — Z515 Encounter for palliative care: Secondary | ICD-10-CM | POA: Insufficient documentation

## 2022-02-23 DIAGNOSIS — Z5111 Encounter for antineoplastic chemotherapy: Secondary | ICD-10-CM | POA: Insufficient documentation

## 2022-02-23 DIAGNOSIS — C799 Secondary malignant neoplasm of unspecified site: Secondary | ICD-10-CM

## 2022-02-23 DIAGNOSIS — G893 Neoplasm related pain (acute) (chronic): Secondary | ICD-10-CM | POA: Insufficient documentation

## 2022-02-23 DIAGNOSIS — Z923 Personal history of irradiation: Secondary | ICD-10-CM | POA: Insufficient documentation

## 2022-02-23 DIAGNOSIS — C50512 Malignant neoplasm of lower-outer quadrant of left female breast: Secondary | ICD-10-CM | POA: Insufficient documentation

## 2022-02-23 DIAGNOSIS — C7951 Secondary malignant neoplasm of bone: Secondary | ICD-10-CM | POA: Insufficient documentation

## 2022-02-23 DIAGNOSIS — Z79811 Long term (current) use of aromatase inhibitors: Secondary | ICD-10-CM | POA: Diagnosis not present

## 2022-02-23 DIAGNOSIS — Z79899 Other long term (current) drug therapy: Secondary | ICD-10-CM | POA: Insufficient documentation

## 2022-02-23 DIAGNOSIS — Z9012 Acquired absence of left breast and nipple: Secondary | ICD-10-CM | POA: Insufficient documentation

## 2022-02-23 LAB — CBC WITH DIFFERENTIAL (CANCER CENTER ONLY)
Abs Immature Granulocytes: 0.01 10*3/uL (ref 0.00–0.07)
Basophils Absolute: 0.1 10*3/uL (ref 0.0–0.1)
Basophils Relative: 3 %
Eosinophils Absolute: 0 10*3/uL (ref 0.0–0.5)
Eosinophils Relative: 1 %
HCT: 36.6 % (ref 36.0–46.0)
Hemoglobin: 12.4 g/dL (ref 12.0–15.0)
Immature Granulocytes: 1 %
Lymphocytes Relative: 25 %
Lymphs Abs: 0.5 10*3/uL — ABNORMAL LOW (ref 0.7–4.0)
MCH: 31.7 pg (ref 26.0–34.0)
MCHC: 33.9 g/dL (ref 30.0–36.0)
MCV: 93.6 fL (ref 80.0–100.0)
Monocytes Absolute: 0.2 10*3/uL (ref 0.1–1.0)
Monocytes Relative: 12 %
Neutro Abs: 1.2 10*3/uL — ABNORMAL LOW (ref 1.7–7.7)
Neutrophils Relative %: 58 %
Platelet Count: 214 10*3/uL (ref 150–400)
RBC: 3.91 MIL/uL (ref 3.87–5.11)
RDW: 20.4 % — ABNORMAL HIGH (ref 11.5–15.5)
WBC Count: 2 10*3/uL — ABNORMAL LOW (ref 4.0–10.5)
nRBC: 0 % (ref 0.0–0.2)

## 2022-02-23 LAB — CMP (CANCER CENTER ONLY)
ALT: 72 U/L — ABNORMAL HIGH (ref 0–44)
AST: 84 U/L — ABNORMAL HIGH (ref 15–41)
Albumin: 3.8 g/dL (ref 3.5–5.0)
Alkaline Phosphatase: 110 U/L (ref 38–126)
Anion gap: 8 (ref 5–15)
BUN: 7 mg/dL (ref 6–20)
CO2: 23 mmol/L (ref 22–32)
Calcium: 8.9 mg/dL (ref 8.9–10.3)
Chloride: 104 mmol/L (ref 98–111)
Creatinine: 0.61 mg/dL (ref 0.44–1.00)
GFR, Estimated: >60 mL/min (ref >60)
Glucose, Bld: 100 mg/dL — ABNORMAL HIGH (ref 70–99)
Potassium: 3.5 mmol/L (ref 3.5–5.1)
Sodium: 135 mmol/L (ref 135–145)
Total Bilirubin: 0.6 mg/dL (ref 0.3–1.2)
Total Protein: 6.7 g/dL (ref 6.5–8.1)

## 2022-02-23 LAB — TSH: TSH: 1.295 u[IU]/mL (ref 0.350–4.500)

## 2022-02-23 MED ORDER — PALBOCICLIB 75 MG PO CAPS: 75.0000 mg | ORAL_CAPSULE | Freq: Every day | ORAL | 6 refills | Status: DC

## 2022-02-23 MED ORDER — PALBOCICLIB 75 MG PO CAPS: 75.0000 mg | ORAL_CAPSULE | Freq: Every day | ORAL | 0 refills | Status: DC

## 2022-02-23 MED ORDER — FULVESTRANT 250 MG/5ML IM SOSY
500.0000 mg | PREFILLED_SYRINGE | Freq: Once | INTRAMUSCULAR | Status: AC
  Administered 2022-02-23: 500 mg via INTRAMUSCULAR
  Filled 2022-02-23: qty 10

## 2022-02-23 NOTE — Assessment & Plan Note (Signed)
06/19/2016 Left breast biopsy 3:30: IDC with DCIS grade 1, ER 90%, PR 50%, Ki-67 15%, HER-2 negative ratio 1.13; biopsy 5:30 position: IDC grade 1   10/26/17: Left mastectomy: IDC grade 1, 2 foci largest spans 8.5 cm, intermediate grade DCIS, lymphovascular invasion identified, perineural invasion identified, 1/2 lymph nodes positive with extracapsular extension, ER 9200%, PR 5 to 50%, HER-2 negative, Ki-67 10 to 15%, T3N1A   Oncotype DX score 22, intermediate risk, chemotherapy not felt to have significant benefit.   Treatment Summary: 1. Antiestrogen therapy with anastrozole 1 mg daily started 07/15/2016 2. Mastectomy 10/26/2017, Mammaprint low risk luminal type A 3. Followed by adjuvant radiation 12/08/17- 01/26/18  4. Followed by adjuvant antiestrogen therapy anastrozole started 01/17/2018 (originally started 07/15/2016) -------------------------------------------------------------------- Low back pain August 2023: Underwent CT myelogram: Large expansile lesion in the sacrum with extraosseous extension of the tumor, diffuse lytic lesions throughout the visualized spine with metastatic disease myeloma is considered less likely.  (This was ordered by Dr. Melrose Nakayama)   Treatment plan: 1.  PET CT scan 12/27/2021: Widespread bone metastatic disease largest lesion involving the sacrum with pathological fractures of T6 and T10 retroperitoneal and pelvic lymph node metastasis, right axillary lymph node, activity in the pancreatic head, hypermetabolic activity in the adrenal glands, right thyroid nodule. 2. biopsy of sacrum: 12/26/2021: Metastatic breast cancer, ER 90%, PR 10%, HER2 negative (0) 3.  Treatment plan: Ibrance along with Faslodex started 12/25/2021 4.  Zometa for bone metastases.  Every 3 months 5.  Palliative radiation to the sacrum completed 01/19/2022 Genetic testing for BRCA  analysis. -------------------------------------------------------------------------------------------------------------------------------------- Current treatment: Ibrance with Faslodex and Zometa, started 12/25/2021 Toxicities: Leukopenia: I will reduce the dosage of Ibrance to 75 mg.  I sent a new prescription to [facility]. Fatigue Alternating constipation and diarrhea Severe bone pain:   Dilaudid appears to be working Difficulty with sleeping: Sent a prescription for lorazepam.   Return to clinic in 4 weeks for injection and follow-up with labs Plan is to obtain CT scans in 1 month and follow-up after the.

## 2022-02-25 ENCOUNTER — Telehealth: Payer: Self-pay

## 2022-02-25 ENCOUNTER — Telehealth: Payer: Self-pay | Admitting: Hematology and Oncology

## 2022-02-25 ENCOUNTER — Other Ambulatory Visit: Payer: Self-pay | Admitting: Genetic Counselor

## 2022-02-25 DIAGNOSIS — Z170 Estrogen receptor positive status [ER+]: Secondary | ICD-10-CM

## 2022-02-25 NOTE — Telephone Encounter (Signed)
Pt called to ask about appts. She states she wants to wait on the genetics appt, and that she is not able to come to appts if they are not on Mondays or Fridays, as her sister is her transportation and she works in [place]. Pt states she will keep appt with palliative as scheduled tomorrow. She is calling to r/s her CT so that it is a Monday or Friday. She knows to call us with the appt to ensure there at 3 business days between CT and MD visit to give time for scan to be read.

## 2022-02-25 NOTE — Telephone Encounter (Signed)
Contacted patient to scheduled appointments. Left message with appointment details and a call back number if patient had any questions or could not accommodate the time we provided.   

## 2022-02-26 ENCOUNTER — Inpatient Hospital Stay (HOSPITAL_BASED_OUTPATIENT_CLINIC_OR_DEPARTMENT_OTHER): Payer: Commercial Managed Care - PPO | Admitting: Nurse Practitioner

## 2022-02-26 ENCOUNTER — Encounter: Payer: Self-pay | Admitting: Nurse Practitioner

## 2022-02-26 DIAGNOSIS — R530 Neoplastic (malignant) related fatigue: Secondary | ICD-10-CM

## 2022-02-26 DIAGNOSIS — G893 Neoplasm related pain (acute) (chronic): Secondary | ICD-10-CM | POA: Diagnosis not present

## 2022-02-26 DIAGNOSIS — Z515 Encounter for palliative care: Secondary | ICD-10-CM | POA: Diagnosis not present

## 2022-02-26 NOTE — Progress Notes (Signed)
Kathryn Lucas  [phone] [phone]   Name: Kathryn Lucas Date: 02/26/2022 MRN: [id number]  DOB: 1960/10/05  Patient Care Team: Nicholas Lose, MD as PCP - General (Hematology and Oncology) Nicholas Lose, MD as Consulting Physician (Hematology and Oncology) Stark Klein, MD as Consulting Physician (General Surgery) Kyung Rudd, MD as Consulting Physician (Radiation Oncology)   I connected with Kathryn Lucas on 02/26/22 at 11:00 AM EST by phone and verified that I am speaking with the correct person using two identifiers.   I discussed the limitations, risks, security and privacy concerns of performing an evaluation and management service by telemedicine and the availability of in-person appointments. I also discussed with the patient that there may be a patient responsible charge related to this service. The patient expressed understanding and agreed to proceed.   Other persons participating in the visit and their role in the encounter: [place], RN   Patient's location: Home  Provider's location: [place] HISTORY: Kathryn Lucas is a 61 y.o. 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 Recent diagnosis (10/2021) of large sacral lesion with diffuse lytic lesions throughout the spine. Palliative ask to see for symptom management and goals of care.   SOCIAL HISTORY:     reports that she has never smoked. She has never used smokeless tobacco. She reports current alcohol use. She reports that she does not use drugs.  ADVANCE DIRECTIVES:    CODE STATUS:   PAST MEDICAL HISTORY: Past Medical History:  Diagnosis Date   Breast cancer ([place])    left breast cancer   Cancer ([place]) 09/2017   left breast cancer   Family history of breast cancer    Hypothyroidism    Personal history of radiation therapy 2019   Thyroid disease      ALLERGIES:  is allergic to percocet [oxycodone-acetaminophen].  MEDICATIONS:  Current Outpatient Medications  Medication Sig Dispense Refill   anastrozole (ARIMIDEX) 1 MG tablet Take 1 tablet (1 mg total) by mouth daily. 90 tablet 3   Calcium 500-100 MG-UNIT CHEW Chew 1 tablet by mouth daily. 60 tablet    cholecalciferol (VITAMIN D3) 25 MCG (1000 UNIT) tablet Take 1 tablet (1,000 Units total) by mouth daily.     gabapentin (NEURONTIN) 300 MG capsule Take 300 mg by mouth 3 (three) times daily.     glycerin adult 2 g suppository Place 1 suppository rectally as needed for constipation. 12 suppository 0   HYDROmorphone (DILAUDID) 2 MG tablet Take 1 tablet (2 mg total) by mouth every 6 (six) hours as needed for severe pain. 60 tablet 0   levothyroxine (SYNTHROID, LEVOTHROID) 175 MCG tablet Take 112 mcg by mouth.      LIDOCAINE PAIN RELIEF 4 % SMARTSIG:Topical     LORazepam (ATIVAN) 0.5 MG tablet Take 1 tablet (0.5 mg total) by mouth at bedtime. 30 tablet 0   magic mouthwash w/lidocaine SOLN Take 5 mLs by mouth 3 (three) times daily. 240 mL 1   morphine (MS CONTIN) 15 MG 12 hr tablet Take 1 tablet (15 mg total) by mouth every 12 (twelve) hours. 30 tablet 0   ondansetron (ZOFRAN) 8 MG tablet Take 1 tablet (8 mg total) by mouth every 8 (eight) hours as needed for nausea. 30 tablet 3   palbociclib (IBRANCE) 75 MG capsule Take 1 capsule (75 mg total) by mouth daily with breakfast.  Take whole with food. Take for 21 days. (As directed by MD) 21 capsule 6   promethazine (PHENERGAN) 25 MG suppository Place 1 suppository (25 mg total) rectally every 6 (six) hours as needed for nausea or vomiting. 12 each 0   traMADol (ULTRAM) 50 MG tablet Take 1-2 tablets (50-100 mg total) by mouth every 6 (six) hours as needed. 30 tablet 0   vitamin C (ASCORBIC ACID) 250 MG tablet Take 1 tablet (250 mg total) by mouth daily.     zinc gluconate 50 MG tablet Take 1 tablet (50 mg total) by mouth daily.     No current  facility-administered medications for this visit.    VITAL SIGNS: There were no vitals taken for this visit. There were no vitals filed for this visit.  Estimated body mass index is 20.46 kg/m as calculated from the following:   Height as of 02/23/22: '5\' 10"'$  (1.778 m).   Weight as of 02/23/22: 142 lb 9.6 oz (64.7 kg).   PERFORMANCE STATUS (ECOG) : 1 - Symptomatic but completely ambulatory   IMPRESSION:  I spoke with Kathryn Lucas for follow-up on symptom management.  No acute distress identified.  States she is trying to remain as active as possible.  Appetite has improved.  She is looking forward to spending time with her sister for the upcoming holidays.  She is appreciative of how well she is feeling reporting her pain is minimal.  She is no longer requiring scheduled or as needed pain medication at this time. Much appreciated.  Denies constipation, nausea, vomiting, or diarrhea  No symptom management needs at this time. Kathryn Lucas knows we are available as needed.   PLAN: Pain much improved. No longer requiring daily medications.  Colace daily I will plan to see patient back 4-6 weeks as needed in collaboration to other oncology appointments.    Patient expressed understanding and was in agreement with this plan. She also understands that She can call the clinic at any time with any questions, concerns, or complaints.      Time Total: 20 min   Visit consisted of counseling and education dealing with the complex and emotionally intense issues of symptom management and palliative care in the setting of serious and potentially life-threatening illness.Greater than 50%  of this time was spent counseling and coordinating care related to the above assessment and plan.  Kathryn Lucas, AGPCNP-BC  Palliative Medicine Team/[facility]

## 2022-03-02 ENCOUNTER — Other Ambulatory Visit: Payer: Commercial Managed Care - PPO

## 2022-03-03 ENCOUNTER — Ambulatory Visit
Admission: RE | Admit: 2022-03-03 | Discharge: 2022-03-03 | Disposition: A | Discharge status: Home / Self Care | Payer: Commercial Managed Care - PPO | Source: Ambulatory Visit | Attending: Radiation Oncology | Admitting: Radiation Oncology

## 2022-03-03 ENCOUNTER — Telehealth: Payer: Self-pay | Admitting: Hematology and Oncology

## 2022-03-03 DIAGNOSIS — C50512 Malignant neoplasm of lower-outer quadrant of left female breast: Secondary | ICD-10-CM | POA: Insufficient documentation

## 2022-03-03 DIAGNOSIS — Z5111 Encounter for antineoplastic chemotherapy: Secondary | ICD-10-CM | POA: Insufficient documentation

## 2022-03-03 DIAGNOSIS — Z9012 Acquired absence of left breast and nipple: Secondary | ICD-10-CM | POA: Insufficient documentation

## 2022-03-03 DIAGNOSIS — Z170 Estrogen receptor positive status [ER+]: Secondary | ICD-10-CM | POA: Insufficient documentation

## 2022-03-03 DIAGNOSIS — Z510 Encounter for antineoplastic radiation therapy: Secondary | ICD-10-CM | POA: Insufficient documentation

## 2022-03-03 DIAGNOSIS — C7951 Secondary malignant neoplasm of bone: Secondary | ICD-10-CM | POA: Insufficient documentation

## 2022-03-03 DIAGNOSIS — Z79811 Long term (current) use of aromatase inhibitors: Secondary | ICD-10-CM | POA: Insufficient documentation

## 2022-03-03 NOTE — Progress Notes (Signed)
  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   Intent: Curative  Radiation Treatment Dates: 01/06/2022 through 01/19/2022 Site Technique Total Dose (Gy) Dose per Fx (Gy) Completed Fx Beam Energies  Sacrum: Spine_Sacrum 3D 30/30 3 10/10 10X, 15X  Thoracic Spine: Spine_T        (as documented in provider EOT note)   The patient was available for call today.  The patient did note fatigue during radiation but has since improved. The patient did not note skin changes in the field of radiation during therapy. The patient has noticed improvement in pain in the area(s) treated with radiation. The patient is not taking dexamethasone. The patient does not have symptoms of  weakness or loss of control of the extremities. The patient does not have symptoms of headache. The patient does not have symptoms of seizure or uncontrolled movement. The patient does not have symptoms of changes in vision. The patient does not have changes in speech. The patient does not have confusion.  The patient is scheduled for ongoing care with Dr. Lindi Adie in medical oncology. The patient was encouraged to call if she develops concerns or questions regarding radiation.  This concludes the interview.   Leandra Kern, LPN

## 2022-03-03 NOTE — Telephone Encounter (Signed)
Scheduled appointment per 12/11 los. Patient is aware.

## 2022-03-05 ENCOUNTER — Telehealth: Payer: Self-pay

## 2022-03-05 NOTE — Telephone Encounter (Signed)
Oral Oncology Pharmacist Encounter  Received message from RN that patient requested a call back about the Ibrance and when she is supposed to start after her week off. Patient started last cycle of Ibrance on 02/05/22 and has been off the medication for the past week. Since patient missed todays dose as she would be restarting, she will start the [facility] tomorrow morning, 03/06/22.   Patient agreed with plan and understood when to start.   Drema Halon, PharmD Hematology/Oncology Clinical Pharmacist [facility] [phone]

## 2022-03-11 ENCOUNTER — Telehealth: Payer: Self-pay | Admitting: Hematology and Oncology

## 2022-03-11 NOTE — Telephone Encounter (Signed)
Rescheduled appointment per provider on call. Patient is aware of the changes made to her upcoming appointment,

## 2022-03-17 NOTE — Progress Notes (Signed)
Patient Care Team: Mckinley Jewel, MD as PCP - General (Internal Medicine) Nicholas Lose, MD as Consulting Physician (Hematology and Oncology) Stark Klein, MD as Consulting Physician (General Surgery) Kyung Rudd, MD as Consulting Physician (Radiation Oncology)  DIAGNOSIS:  Encounter Diagnosis  Name Primary?   Malignant neoplasm of lower-outer quadrant of left breast of female, estrogen receptor positive ([place]) Yes    SUMMARY OF ONCOLOGIC HISTORY: Oncology History  Malignant neoplasm of lower-outer quadrant of left breast of female, estrogen receptor positive ([place])  06/11/2016 Mammogram   Palpable left breast masses 3:00 position: 2.2 cm; 5:30 position: 2.5 cm; 6:30 position: 0.7 cm   06/19/2016 Initial Diagnosis   Left breast biopsy 3:30: IDC with DCIS grade 1, ER 90%, PR 50%, Ki-67 15%, HER-2 negative ratio 1.13; biopsy 5:30 position: IDC grade 1   07/13/2016 Breast MRI   Large area of abnormal enhancement lower inner and lower outer quadrants left breast spanning 9 cm x 6.4 cm x 5.3 cm, no abnormal enlarged lymph nodes; T3 N0 stage II a (New AJCC staging)    07/15/2016 - 12/08/2017 Anti-estrogen oral therapy   Neoadjuvant anastrozole 1 mg daily   07/17/2016 Oncotype testing   Testing done on the biopsy: Oncotype DX score 22, intermediate risk   02/02/2017 Breast MRI   Left breast multicentric disease unchanged measuring 2.7 x 1.6 cm.  Mass in the non-mass enhancement are also not significantly changed measuring 6.2 x 2.4 cm. new enhancing mass within the outer right breast 7 mm which could be fat necrosis or inclusion cyst    02/09/2017 Imaging   Ultrasound of the right breast lesion noted on MRI: No sonographic finding corresponds to the abnormality noted on MRI   07/13/2017 Cancer Staging   Staging form: Breast, AJCC 8th Edition - Clinical stage from 07/13/2017: Stage IIA (cT3, cN0, cM0, G1, ER+, PR+, HER2-) - Signed by Gardenia Phlegm, NP on 05/18/2018   10/26/2017  Surgery   Left mastectomy: IDC grade 1, 2 foci largest spans 8.5 cm, intermediate grade DCIS, lymphovascular invasion identified, perineural invasion identified, 1/2 lymph nodes positive with extracapsular extension, ER 9200%, PR 5 to 50%, HER-2 negative, Ki-67 10 to 15%, T3N1A Mammaprint: low risk   11/02/2017 Cancer Staging   Staging form: Breast, AJCC 8th Edition - Pathologic: No Stage Recommended (ypT3, pN1a, cM0, G1, ER+, PR+, HER2-) - Signed by Nicholas Lose, MD on 11/02/2017   12/08/2017 - 01/26/2018 Radiation Therapy   Adjuvant radiation therapy    02/2018 -  Anti-estrogen oral therapy   Anastrozole 1 mg daily adjuvant therapy     CHIEF COMPLIANT: Follow to review scans  INTERVAL HISTORY: Kathryn Lucas is a 62 y.o. with above-mentioned history of left breast cancer treated with mastectomy, radiation therapy, and is currently on anti-estrogen therapy with anastrozole. She presents to the clinic for a follow-up to review scans.  She has had a remarkable change in her performance status.  She is now able to walk around without assistance.  The pain is nearly subsided.  She had a couple of areas of soreness especially in the right arm and the right hip area but that is because she was pushing so hard to get up and that may have caused some sprain.  She tells me that she is eating better and is gaining some weight.   ALLERGIES:  is allergic to percocet [oxycodone-acetaminophen].  MEDICATIONS:  Current Outpatient Medications  Medication Sig Dispense Refill   anastrozole (ARIMIDEX) 1 MG tablet Take  1 tablet (1 mg total) by mouth daily. 90 tablet 3   Calcium 500-100 MG-UNIT CHEW Chew 1 tablet by mouth daily. 60 tablet    cholecalciferol (VITAMIN D3) 25 MCG (1000 UNIT) tablet Take 1 tablet (1,000 Units total) by mouth daily.     gabapentin (NEURONTIN) 300 MG capsule Take 300 mg by mouth 3 (three) times daily.     glycerin adult 2 g suppository Place 1 suppository rectally as needed for  constipation. 12 suppository 0   HYDROmorphone (DILAUDID) 2 MG tablet Take 1 tablet (2 mg total) by mouth every 6 (six) hours as needed for severe pain. 60 tablet 0   levothyroxine (SYNTHROID, LEVOTHROID) 175 MCG tablet Take 112 mcg by mouth.      LIDOCAINE PAIN RELIEF 4 % SMARTSIG:Topical     LORazepam (ATIVAN) 0.5 MG tablet Take 1 tablet (0.5 mg total) by mouth at bedtime. 30 tablet 0   magic mouthwash w/lidocaine SOLN Take 5 mLs by mouth 3 (three) times daily. 240 mL 1   morphine (MS CONTIN) 15 MG 12 hr tablet Take 1 tablet (15 mg total) by mouth every 12 (twelve) hours. 30 tablet 0   ondansetron (ZOFRAN) 8 MG tablet Take 1 tablet (8 mg total) by mouth every 8 (eight) hours as needed for nausea. 30 tablet 3   palbociclib (IBRANCE) 75 MG capsule Take 1 capsule (75 mg total) by mouth daily with breakfast. Take whole with food. Take for 21 days. (As directed by MD) 21 capsule 6   promethazine (PHENERGAN) 25 MG suppository Place 1 suppository (25 mg total) rectally every 6 (six) hours as needed for nausea or vomiting. 12 each 0   traMADol (ULTRAM) 50 MG tablet Take 1-2 tablets (50-100 mg total) by mouth every 6 (six) hours as needed. 30 tablet 0   vitamin C (ASCORBIC ACID) 250 MG tablet Take 1 tablet (250 mg total) by mouth daily.     zinc gluconate 50 MG tablet Take 1 tablet (50 mg total) by mouth daily.     No current facility-administered medications for this visit.   Facility-Administered Medications Ordered in Other Visits  Medication Dose Route Frequency Provider Last Rate Last Admin   fulvestrant (FASLODEX) injection 500 mg  500 mg Intramuscular Once Nicholas Lose, MD        PHYSICAL EXAMINATION: ECOG PERFORMANCE STATUS: 1 - Symptomatic but completely ambulatory  Vitals:   03/23/22 1234  BP: 110/79  Pulse: (!) 108  Resp: (!) 22  Temp: 98.8 F (37.1 C)  SpO2: 99%   Filed Weights   03/23/22 1234  Weight: 145 lb 9.6 oz (66 kg)      LABORATORY DATA:  I have reviewed the data  as listed    Latest Ref Rng & Units 03/20/2022    5:11 PM 02/23/2022    2:31 PM 01/26/2022    2:50 PM  CMP  Glucose 70 - 99 mg/dL  100  112   BUN 6 - 20 mg/dL  7  13   Creatinine 0.44 - 1.00 mg/dL 0.70  0.61  0.67   Sodium 135 - 145 mmol/L  135  130   Potassium 3.5 - 5.1 mmol/L  3.5  3.7   Chloride 98 - 111 mmol/L  104  100   CO2 22 - 32 mmol/L  23  24   Calcium 8.9 - 10.3 mg/dL  8.9  8.4   Total Protein 6.5 - 8.1 g/dL  6.7  6.7   Total Bilirubin 0.3 -  1.2 mg/dL  0.6  0.6   Alkaline Phos 38 - 126 U/L  110  119   AST 15 - 41 U/L  84  64   ALT 0 - 44 U/L  72  10     Lab Results  Component Value Date   WBC 2.0 (L) 02/23/2022   HGB 12.4 02/23/2022   HCT 36.6 02/23/2022   MCV 93.6 02/23/2022   PLT 214 02/23/2022   NEUTROABS 1.2 (L) 02/23/2022    ASSESSMENT & PLAN:  Malignant neoplasm of lower-outer quadrant of left breast of female, estrogen receptor positive ([place]) 06/19/2016 Left breast biopsy 3:30: IDC with DCIS grade 1, ER 90%, PR 50%, Ki-67 15%, HER-2 negative ratio 1.13; biopsy 5:30 position: IDC grade 1   10/26/17: Left mastectomy: IDC grade 1, 2 foci largest spans 8.5 cm, intermediate grade DCIS, lymphovascular invasion identified, perineural invasion identified, 1/2 lymph nodes positive with extracapsular extension, ER 9200%, PR 5 to 50%, HER-2 negative, Ki-67 10 to 15%, T3N1A   Oncotype DX score 22, intermediate risk, chemotherapy not felt to have significant benefit.   Treatment Summary: 1. Antiestrogen therapy with anastrozole 1 mg daily started 07/15/2016 2. Mastectomy 10/26/2017, Mammaprint low risk luminal type A 3. Followed by adjuvant radiation 12/08/17- 01/26/18  4. Followed by adjuvant antiestrogen therapy anastrozole started 01/17/2018 (originally started 07/15/2016) -------------------------------------------------------------------- Low back pain August 2023: Underwent CT myelogram: Large expansile lesion in the sacrum with extraosseous extension of the tumor,  diffuse lytic lesions throughout the visualized spine with metastatic disease myeloma is considered less likely.  (This was ordered by Dr. Melrose Nakayama)   Treatment plan: 1.  PET CT scan 12/27/2021: Widespread bone metastatic disease largest lesion involving the sacrum with pathological fractures of T6 and T10 retroperitoneal and pelvic lymph node metastasis, right axillary lymph node, activity in the pancreatic head, hypermetabolic activity in the adrenal glands, right thyroid nodule. 2. biopsy of sacrum: 12/26/2021: Metastatic breast cancer, ER 90%, PR 10%, HER2 negative (0) 3.  Treatment plan: Ibrance along with Faslodex started 12/25/2021 4.  Xgeva for bone metastases.  Every 3 months 5.  Palliative radiation to the sacrum completed 01/19/2022 Genetic testing for BRCA analysis. -------------------------------------------------------------------------------------------------------------------------------------- Current treatment: Ibrance with Faslodex and Zometa, started 12/25/2021 Toxicities: Leukopenia: Currently on Ibrance 75 mg. I sent a new prescription   Fatigue Alternating constipation and diarrhea Severe bone pain: Patient is not taking any pain medications currently because the pain is under very good control without it.   CT chest 03/20/21: Stable disease in the multiple areas of the bone, pancreatic lesion, liver lesion, kidney lesions, adrenal lesions, lymph nodes: Stable Today she receives Xgeva injection as well. Patient is extremely relieved to hear the CT scan reports. Return to clinic in 1 month for labs and follow-up with Jenny Reichmann.  No orders of the defined types were placed in this encounter.  The patient has a good understanding of the overall plan. she agrees with it. she will call with any problems that may develop before the next visit here. Total time spent: 30 mins including face to face time and time spent for planning, charting and co-ordination of care   Harriette Ohara, MD 03/23/22    I Gardiner Coins am acting as a Education administrator for [employer]  I have reviewed the above documentation for accuracy and completeness, and I agree with the above.

## 2022-03-19 ENCOUNTER — Ambulatory Visit (HOSPITAL_COMMUNITY): Payer: Commercial Managed Care - PPO

## 2022-03-20 ENCOUNTER — Other Ambulatory Visit (HOSPITAL_COMMUNITY): Payer: Self-pay

## 2022-03-20 ENCOUNTER — Other Ambulatory Visit: Payer: Self-pay | Admitting: *Deleted

## 2022-03-20 ENCOUNTER — Ambulatory Visit (HOSPITAL_COMMUNITY)
Admission: RE | Admit: 2022-03-20 | Discharge: 2022-03-20 | Disposition: A | Discharge status: Home / Self Care | Payer: Commercial Managed Care - PPO | Source: Ambulatory Visit | Attending: Hematology and Oncology | Admitting: Hematology and Oncology

## 2022-03-20 DIAGNOSIS — C50512 Malignant neoplasm of lower-outer quadrant of left female breast: Secondary | ICD-10-CM | POA: Diagnosis not present

## 2022-03-20 DIAGNOSIS — Z923 Personal history of irradiation: Secondary | ICD-10-CM

## 2022-03-20 DIAGNOSIS — Z170 Estrogen receptor positive status [ER+]: Secondary | ICD-10-CM | POA: Diagnosis present

## 2022-03-20 LAB — POCT I-STAT CREATININE: Creatinine, Ser: 0.7 mg/dL (ref 0.44–1.00)

## 2022-03-20 MED ORDER — SODIUM CHLORIDE (PF) 0.9 % IJ SOLN
INTRAMUSCULAR | Status: AC
  Filled 2022-03-20: qty 50

## 2022-03-20 MED ORDER — IOHEXOL 300 MG/ML  SOLN
100.0000 mL | Freq: Once | INTRAMUSCULAR | Status: AC | PRN
  Administered 2022-03-20: 100 mL via INTRAVENOUS

## 2022-03-20 MED ORDER — PALBOCICLIB 75 MG PO CAPS: 75.0000 mg | ORAL_CAPSULE | Freq: Every day | ORAL | 6 refills | Status: DC

## 2022-03-23 ENCOUNTER — Inpatient Hospital Stay: Payer: Commercial Managed Care - PPO

## 2022-03-23 ENCOUNTER — Inpatient Hospital Stay: Payer: Commercial Managed Care - PPO | Attending: Hematology and Oncology | Admitting: Hematology and Oncology

## 2022-03-23 ENCOUNTER — Ambulatory Visit: Payer: Commercial Managed Care - PPO | Admitting: Hematology and Oncology

## 2022-03-23 VITALS — BP 110/79 | HR 108 | Temp 98.8°F | Resp 22 | Wt 145.6 lb

## 2022-03-23 DIAGNOSIS — C50512 Malignant neoplasm of lower-outer quadrant of left female breast: Secondary | ICD-10-CM | POA: Diagnosis not present

## 2022-03-23 DIAGNOSIS — Z515 Encounter for palliative care: Secondary | ICD-10-CM | POA: Insufficient documentation

## 2022-03-23 DIAGNOSIS — G893 Neoplasm related pain (acute) (chronic): Secondary | ICD-10-CM | POA: Diagnosis not present

## 2022-03-23 DIAGNOSIS — Z79899 Other long term (current) drug therapy: Secondary | ICD-10-CM | POA: Insufficient documentation

## 2022-03-23 DIAGNOSIS — Z79811 Long term (current) use of aromatase inhibitors: Secondary | ICD-10-CM | POA: Insufficient documentation

## 2022-03-23 DIAGNOSIS — Z923 Personal history of irradiation: Secondary | ICD-10-CM | POA: Diagnosis not present

## 2022-03-23 DIAGNOSIS — C799 Secondary malignant neoplasm of unspecified site: Secondary | ICD-10-CM

## 2022-03-23 DIAGNOSIS — C7951 Secondary malignant neoplasm of bone: Secondary | ICD-10-CM | POA: Diagnosis not present

## 2022-03-23 DIAGNOSIS — Z5111 Encounter for antineoplastic chemotherapy: Secondary | ICD-10-CM | POA: Diagnosis not present

## 2022-03-23 DIAGNOSIS — Z170 Estrogen receptor positive status [ER+]: Secondary | ICD-10-CM | POA: Insufficient documentation

## 2022-03-23 DIAGNOSIS — Z79891 Long term (current) use of opiate analgesic: Secondary | ICD-10-CM | POA: Diagnosis not present

## 2022-03-23 DIAGNOSIS — Z9012 Acquired absence of left breast and nipple: Secondary | ICD-10-CM | POA: Insufficient documentation

## 2022-03-23 MED ORDER — FULVESTRANT 250 MG/5ML IM SOSY
500.0000 mg | PREFILLED_SYRINGE | Freq: Once | INTRAMUSCULAR | Status: AC
  Administered 2022-03-23: 500 mg via INTRAMUSCULAR
  Filled 2022-03-23: qty 10

## 2022-03-23 MED ORDER — DENOSUMAB 120 MG/1.7ML ~~LOC~~ SOLN
120.0000 mg | Freq: Once | SUBCUTANEOUS | Status: AC
  Administered 2022-03-23: 120 mg via SUBCUTANEOUS
  Filled 2022-03-23: qty 1.7

## 2022-03-23 NOTE — Assessment & Plan Note (Signed)
06/19/2016 Left breast biopsy 3:30: IDC with DCIS grade 1, ER 90%, PR 50%, Ki-67 15%, HER-2 negative ratio 1.13; biopsy 5:30 position: IDC grade 1   10/26/17: Left mastectomy: IDC grade 1, 2 foci largest spans 8.5 cm, intermediate grade DCIS, lymphovascular invasion identified, perineural invasion identified, 1/2 lymph nodes positive with extracapsular extension, ER 9200%, PR 5 to 50%, HER-2 negative, Ki-67 10 to 15%, T3N1A   Oncotype DX score 22, intermediate risk, chemotherapy not felt to have significant benefit.   Treatment Summary: 1. Antiestrogen therapy with anastrozole 1 mg daily started 07/15/2016 2. Mastectomy 10/26/2017, Mammaprint low risk luminal type A 3. Followed by adjuvant radiation 12/08/17- 01/26/18  4. Followed by adjuvant antiestrogen therapy anastrozole started 01/17/2018 (originally started 07/15/2016) -------------------------------------------------------------------- Low back pain August 2023: Underwent CT myelogram: Large expansile lesion in the sacrum with extraosseous extension of the tumor, diffuse lytic lesions throughout the visualized spine with metastatic disease myeloma is considered less likely.  (This was ordered by Dr. Melrose Nakayama)   Treatment plan: 1.  PET CT scan 12/27/2021: Widespread bone metastatic disease largest lesion involving the sacrum with pathological fractures of T6 and T10 retroperitoneal and pelvic lymph node metastasis, right axillary lymph node, activity in the pancreatic head, hypermetabolic activity in the adrenal glands, right thyroid nodule. 2. biopsy of sacrum: 12/26/2021: Metastatic breast cancer, ER 90%, PR 10%, HER2 negative (0) 3.  Treatment plan: Ibrance along with Faslodex started 12/25/2021 4.  Zometa for bone metastases.  Every 3 months 5.  Palliative radiation to the sacrum completed 01/19/2022 Genetic testing for BRCA  analysis. -------------------------------------------------------------------------------------------------------------------------------------- Current treatment: Ibrance with Faslodex and Zometa, started 12/25/2021 Toxicities: Leukopenia: Currently on Ibrance 75 mg. I sent a new prescription   Fatigue Alternating constipation and diarrhea Severe bone pain: Dilaudid appears to be working    CT chest 03/20/21:

## 2022-03-24 ENCOUNTER — Telehealth: Payer: Self-pay | Admitting: Genetic Counselor

## 2022-03-24 NOTE — Telephone Encounter (Signed)
Called to let the patient know we did not get blood at her last appointment for genetic testing and to set up another appointment.  She states that at this time her sister does not want to go forward with this and so she thanks [place] for our time, but does not want to r/s the blood draw.

## 2022-03-25 ENCOUNTER — Telehealth: Payer: Self-pay | Admitting: Hematology and Oncology

## 2022-03-25 NOTE — Telephone Encounter (Signed)
Scheduled appointment per 1/9 los. Patient is aware.

## 2022-03-26 ENCOUNTER — Other Ambulatory Visit (HOSPITAL_COMMUNITY): Payer: Self-pay

## 2022-03-30 ENCOUNTER — Other Ambulatory Visit (HOSPITAL_COMMUNITY): Payer: Self-pay

## 2022-04-02 ENCOUNTER — Encounter: Payer: Self-pay | Admitting: Nurse Practitioner

## 2022-04-02 ENCOUNTER — Inpatient Hospital Stay (HOSPITAL_BASED_OUTPATIENT_CLINIC_OR_DEPARTMENT_OTHER): Payer: Commercial Managed Care - PPO | Admitting: Nurse Practitioner

## 2022-04-02 DIAGNOSIS — Z515 Encounter for palliative care: Secondary | ICD-10-CM | POA: Diagnosis not present

## 2022-04-02 DIAGNOSIS — C7951 Secondary malignant neoplasm of bone: Secondary | ICD-10-CM | POA: Diagnosis not present

## 2022-04-02 DIAGNOSIS — G4700 Insomnia, unspecified: Secondary | ICD-10-CM | POA: Diagnosis not present

## 2022-04-02 DIAGNOSIS — R110 Nausea: Secondary | ICD-10-CM

## 2022-04-02 DIAGNOSIS — F419 Anxiety disorder, unspecified: Secondary | ICD-10-CM

## 2022-04-02 DIAGNOSIS — G4709 Other insomnia: Secondary | ICD-10-CM

## 2022-04-02 MED ORDER — LORAZEPAM 0.5 MG PO TABS: 0.5000 mg | ORAL_TABLET | Freq: Every day | ORAL | 0 refills | Status: DC

## 2022-04-02 NOTE — Progress Notes (Signed)
[name]  [phone] [phone]   Name: Kathryn Lucas Date: 04/02/2022 MRN: [id number]  DOB: 1960/10/12  Patient Care Team: Mckinley Jewel, MD as PCP - General (Internal Medicine) Nicholas Lose, MD as Consulting Physician (Hematology and Oncology) Stark Klein, MD as Consulting Physician (General Surgery) Kyung Rudd, MD as Consulting Physician (Radiation Oncology)   I connected with Kathryn Lucas on 04/02/22 at 11:00 AM EST by phone and verified that I am speaking with the correct person using two identifiers.   I discussed the limitations, risks, security and privacy concerns of performing an evaluation and management service by telemedicine and the availability of in-person appointments. I also discussed with the patient that there may be a patient responsible charge related to this service. The patient expressed understanding and agreed to proceed.   Other persons participating in the visit and their role in the encounter: [place], RN   Patient's location: Home  Provider's location: [place] HISTORY: Kathryn Lucas is a 62 y.o. 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 Recent diagnosis (10/2021) of large sacral lesion with diffuse lytic lesions throughout the spine. Palliative ask to see for symptom management and goals of care.   SOCIAL HISTORY:     reports that she has never smoked. She has never used smokeless tobacco. She reports current alcohol use. She reports that she does not use drugs.  ADVANCE DIRECTIVES:    CODE STATUS:   PAST MEDICAL HISTORY: Past Medical History:  Diagnosis Date   Breast cancer ([place])    left breast cancer   Cancer ([place]) 09/2017   left breast cancer   Family history of breast cancer    Hypothyroidism    Personal history of radiation therapy 2019   Thyroid disease      ALLERGIES:  is allergic to percocet [oxycodone-acetaminophen].  MEDICATIONS:  Current Outpatient Medications  Medication Sig Dispense Refill   anastrozole (ARIMIDEX) 1 MG tablet Take 1 tablet (1 mg total) by mouth daily. 90 tablet 3   Calcium 500-100 MG-UNIT CHEW Chew 1 tablet by mouth daily. 60 tablet    cholecalciferol (VITAMIN D3) 25 MCG (1000 UNIT) tablet Take 1 tablet (1,000 Units total) by mouth daily.     gabapentin (NEURONTIN) 300 MG capsule Take 300 mg by mouth 3 (three) times daily.     glycerin adult 2 g suppository Place 1 suppository rectally as needed for constipation. 12 suppository 0   HYDROmorphone (DILAUDID) 2 MG tablet Take 1 tablet (2 mg total) by mouth every 6 (six) hours as needed for severe pain. 60 tablet 0   levothyroxine (SYNTHROID, LEVOTHROID) 175 MCG tablet Take 112 mcg by mouth.      LIDOCAINE PAIN RELIEF 4 % SMARTSIG:Topical     LORazepam (ATIVAN) 0.5 MG tablet Take 1 tablet (0.5 mg total) by mouth at bedtime. 30 tablet 0   magic mouthwash w/lidocaine SOLN Take 5 mLs by mouth 3 (three) times daily. 240 mL 1   morphine (MS CONTIN) 15 MG 12 hr tablet Take 1 tablet (15 mg total) by mouth every 12 (twelve) hours. 30 tablet 0   ondansetron (ZOFRAN) 8 MG tablet Take 1 tablet (8 mg total) by mouth every 8 (eight) hours as needed for nausea. 30 tablet 3   palbociclib (IBRANCE) 75 MG capsule Take 1 capsule (75 mg total) by mouth daily with breakfast.  Take whole with food. Take for 21 days. (As directed by MD) 21 capsule 6   promethazine (PHENERGAN) 25 MG suppository Place 1 suppository (25 mg total) rectally every 6 (six) hours as needed for nausea or vomiting. 12 each 0   traMADol (ULTRAM) 50 MG tablet Take 1-2 tablets (50-100 mg total) by mouth every 6 (six) hours as needed. 30 tablet 0   vitamin C (ASCORBIC ACID) 250 MG tablet Take 1 tablet (250 mg total) by mouth daily.     zinc gluconate 50 MG tablet Take 1 tablet (50 mg total) by mouth daily.     No current  facility-administered medications for this visit.    VITAL SIGNS: There were no vitals taken for this visit. There were no vitals filed for this visit.  Estimated body mass index is 20.89 kg/m as calculated from the following:   Height as of 02/23/22: '5\' 10"'$  (1.778 m).   Weight as of 03/23/22: 145 lb 9.6 oz (66 kg).   PERFORMANCE STATUS (ECOG) : 1 - Symptomatic but completely ambulatory   IMPRESSION: I connected with Kathryn Lucas via phone for follow-up. No acute distress identified. Continues to do well which she is appreciative of. Denies constipation, nausea, vomiting, or diarrhea  Pain has resolved and she is only requiring Tylenol as needed for mild aches or discomfort. Remaining as active as possible. Endorses occasional nights of insomnia. This is resolved with use of as needed lorazepam.   Support provided and [name] are aware we are available as needed.   PLAN: Pain much improved. No longer requiring daily medications. Tylenol for mild aches and discomfort.  Colace daily Ativan as needed for sleep/anxiety.  I will plan to see patient back 6-8 weeks as needed in collaboration to other oncology appointments.    Patient expressed understanding and was in agreement with this plan. She also understands that She can call the clinic at any time with any questions, concerns, or complaints.   Any controlled substances utilized were prescribed in the context of palliative care. PDMP has been reviewed.    Time Total: 20 min  Visit consisted of counseling and education dealing with the complex and emotionally intense issues of symptom management and palliative care in the setting of serious and potentially life-threatening illness.Greater than 50%  of this time was spent counseling and coordinating care related to the above assessment and plan.  [name], AGPCNP-BC  Palliative Medicine Team/[place]

## 2022-04-09 ENCOUNTER — Telehealth: Payer: Self-pay | Admitting: *Deleted

## 2022-04-09 NOTE — Telephone Encounter (Signed)
"  Kathryn Lucas, 1960-12-18, [phone] (home) calling to see if Mackie Pai and Starla Link received disability paperwork.  E-mailed 04/06/2022.  I am not to return to work however they need a date I may be able to return to work or I lose insurance and medications."  Connected with [name].  Advised currently no receipt of disability paperwork.  Confirmed her use of correct email address [email or web address].  Apologized for delay.  Will connect with her upon receipt however advised to bring form in at next appointment on 04/20/2022 and sign [place] [employer] at that time.  Currently no further questions or needs.

## 2022-04-10 ENCOUNTER — Telehealth: Payer: Self-pay

## 2022-04-10 NOTE — Telephone Encounter (Signed)
Palliative care outreach to patient to complete telephonic check in and schedule PC visit.   Call unsuccessful. SW LVM.

## 2022-04-13 ENCOUNTER — Other Ambulatory Visit: Payer: Commercial Managed Care - PPO

## 2022-04-13 DIAGNOSIS — Z515 Encounter for palliative care: Secondary | ICD-10-CM

## 2022-04-13 NOTE — Progress Notes (Signed)
TELEPHONIC CHECK IN/VISIT  Return call from patient to [facility] SW connected with patient via telephone to complete telephonic visit and schedule in person visit.  Patient states that she is doing well overall since previous PC visit  States her main concern is pain in her neck and trying to avoid pressure sores to her heels.  PC scheduled in home visit for Wed 04/22/22 '@1pm'$  with RN/SW.

## 2022-04-16 ENCOUNTER — Telehealth: Payer: Self-pay | Admitting: *Deleted

## 2022-04-16 ENCOUNTER — Telehealth: Payer: Self-pay

## 2022-04-16 NOTE — Telephone Encounter (Signed)
Returned phone call from Patient. Patient requesting information regarding approval for Social Security Disability. Patient states that she logged in on the website and saw that she was approved today. Provided information to Patient regarding approval process and advised Patient to contact Social Security or to wait for an approval letter from [employer] which would provide more information regarding approval process and receipt of benefits once approved. Patient verbalized understanding.

## 2022-04-16 NOTE — Telephone Encounter (Signed)
"  This is Kathryn Lucas.  Have a question for [name].  Please give me a call at [phone]." Last sentence muddled.  No further information received.

## 2022-04-20 ENCOUNTER — Inpatient Hospital Stay: Payer: Commercial Managed Care - PPO | Attending: Hematology and Oncology

## 2022-04-20 ENCOUNTER — Inpatient Hospital Stay: Payer: Commercial Managed Care - PPO | Admitting: Pharmacist

## 2022-04-20 ENCOUNTER — Inpatient Hospital Stay: Payer: Commercial Managed Care - PPO

## 2022-04-20 ENCOUNTER — Telehealth: Payer: Self-pay | Admitting: *Deleted

## 2022-04-20 VITALS — BP 109/75 | HR 114 | Temp 97.7°F | Resp 18 | Ht 70.0 in | Wt 150.2 lb

## 2022-04-20 DIAGNOSIS — C50512 Malignant neoplasm of lower-outer quadrant of left female breast: Secondary | ICD-10-CM | POA: Diagnosis present

## 2022-04-20 DIAGNOSIS — Z170 Estrogen receptor positive status [ER+]: Secondary | ICD-10-CM | POA: Insufficient documentation

## 2022-04-20 DIAGNOSIS — G893 Neoplasm related pain (acute) (chronic): Secondary | ICD-10-CM | POA: Insufficient documentation

## 2022-04-20 DIAGNOSIS — Z9012 Acquired absence of left breast and nipple: Secondary | ICD-10-CM | POA: Insufficient documentation

## 2022-04-20 DIAGNOSIS — Z79899 Other long term (current) drug therapy: Secondary | ICD-10-CM | POA: Diagnosis not present

## 2022-04-20 DIAGNOSIS — Z79811 Long term (current) use of aromatase inhibitors: Secondary | ICD-10-CM | POA: Diagnosis not present

## 2022-04-20 DIAGNOSIS — Z923 Personal history of irradiation: Secondary | ICD-10-CM | POA: Diagnosis not present

## 2022-04-20 DIAGNOSIS — Z515 Encounter for palliative care: Secondary | ICD-10-CM | POA: Diagnosis not present

## 2022-04-20 DIAGNOSIS — Z7951 Long term (current) use of inhaled steroids: Secondary | ICD-10-CM | POA: Diagnosis not present

## 2022-04-20 DIAGNOSIS — Z79891 Long term (current) use of opiate analgesic: Secondary | ICD-10-CM | POA: Insufficient documentation

## 2022-04-20 DIAGNOSIS — Z5111 Encounter for antineoplastic chemotherapy: Secondary | ICD-10-CM | POA: Diagnosis present

## 2022-04-20 DIAGNOSIS — C799 Secondary malignant neoplasm of unspecified site: Secondary | ICD-10-CM

## 2022-04-20 LAB — CMP (CANCER CENTER ONLY)
ALT: 41 U/L (ref 0–44)
AST: 94 U/L — ABNORMAL HIGH (ref 15–41)
Albumin: 3.7 g/dL (ref 3.5–5.0)
Alkaline Phosphatase: 74 U/L (ref 38–126)
Anion gap: 5 (ref 5–15)
BUN: 15 mg/dL (ref 8–23)
CO2: 25 mmol/L (ref 22–32)
Calcium: 9.4 mg/dL (ref 8.9–10.3)
Chloride: 105 mmol/L (ref 98–111)
Creatinine: 0.7 mg/dL (ref 0.44–1.00)
GFR, Estimated: >60 mL/min (ref >60)
Glucose, Bld: 83 mg/dL (ref 70–99)
Potassium: 4.3 mmol/L (ref 3.5–5.1)
Sodium: 135 mmol/L (ref 135–145)
Total Bilirubin: 0.6 mg/dL (ref 0.3–1.2)
Total Protein: 6.3 g/dL — ABNORMAL LOW (ref 6.5–8.1)

## 2022-04-20 LAB — CBC WITH DIFFERENTIAL (CANCER CENTER ONLY)
Abs Immature Granulocytes: 0 10*3/uL (ref 0.00–0.07)
Basophils Absolute: 0 10*3/uL (ref 0.0–0.1)
Basophils Relative: 2 %
Eosinophils Absolute: 0 10*3/uL (ref 0.0–0.5)
Eosinophils Relative: 1 %
HCT: 37.2 % (ref 36.0–46.0)
Hemoglobin: 13.1 g/dL (ref 12.0–15.0)
Immature Granulocytes: 0 %
Lymphocytes Relative: 24 %
Lymphs Abs: 0.4 10*3/uL — ABNORMAL LOW (ref 0.7–4.0)
MCH: 35.2 pg — ABNORMAL HIGH (ref 26.0–34.0)
MCHC: 35.2 g/dL (ref 30.0–36.0)
MCV: 100 fL (ref 80.0–100.0)
Monocytes Absolute: 0.2 10*3/uL (ref 0.1–1.0)
Monocytes Relative: 10 %
Neutro Abs: 1.1 10*3/uL — ABNORMAL LOW (ref 1.7–7.7)
Neutrophils Relative %: 63 %
Platelet Count: 123 10*3/uL — ABNORMAL LOW (ref 150–400)
RBC: 3.72 MIL/uL — ABNORMAL LOW (ref 3.87–5.11)
RDW: 16.3 % — ABNORMAL HIGH (ref 11.5–15.5)
WBC Count: 1.7 10*3/uL — ABNORMAL LOW (ref 4.0–10.5)
nRBC: 0 % (ref 0.0–0.2)

## 2022-04-20 MED ORDER — FULVESTRANT 250 MG/5ML IM SOSY
500.0000 mg | PREFILLED_SYRINGE | Freq: Once | INTRAMUSCULAR | Status: AC
  Administered 2022-04-20: 500 mg via INTRAMUSCULAR
  Filled 2022-04-20: qty 10

## 2022-04-20 NOTE — Patient Instructions (Signed)

## 2022-04-20 NOTE — Telephone Encounter (Signed)
Connected with [name] in main entry lobby to discuss leave of absence as requested per collaborative.   "Received letter from employer, [employer].  Soon will exhaust leave under [employer] policies.  Letter reads to answer four bolded questions to be considered as an accommodation for leave disability.  I'll call to check on the status.  It is due by 05/04/2022 or I loose benefits and employment.  Is this disability?" This nurse will investigate further to request form for leave extension, may these four questions be answered on letter mailed to patient or if provider required to create letter on [place].   Unable to provide completion date of request.  Ask 7-10 business days (14-calendar) to process.  Advised to connect with [employer] regarding disability.  Denies disability insurance through employer.  Currently no further questions or needs.  To injection at 1500. Message left for [employer] asking above.  Provided direct extension for return call.

## 2022-04-20 NOTE — Progress Notes (Signed)
[place]       Telephone: [phone]?Fax: [phone]   Oncology Clinical Pharmacist Practitioner Initial Assessment  Kathryn Lucas is a 62 y.o. female with a diagnosis of breast cancer. They were contacted today via in-person visit.  Indication/Regimen Palbociclib Kathryn Lucas) is being used appropriately for treatment of metastatic breast cancer by Dr. Nicholas Lose.      Wt Readings from Last 1 Encounters:  04/20/22 150 lb 3.2 oz (68.1 kg)    Estimated body surface area is 1.83 meters squared as calculated from the following:   Height as of this encounter: '5\' 10"'$  (1.778 m).   Weight as of this encounter: 150 lb 3.2 oz (68.1 kg).  The dosing regimen is 75 mg by mouth daily on days 1 to 21 of a 28-day cycle. This is being given  in combination with fulvestrant (started 12/25/21), anastrozole (started 07/15/16), and denosumab 120 mg (12/25/21) . It is planned to continue until disease progression or unacceptable toxicity.  Kathryn Lucas was seen today by clinical pharmacy to establish care for her management of palbociclib and referred by Dr. Lindi Adie.  She was initially started on palbociclib 100 mg by mouth daily 21 out of 28 days but was then reduced to her current dose of 75 mg on 01/26/22 due to side effects.  Today we went over possible side effects of palbociclib which include but are not limited to myelosuppression, pneumonitis, fatigue, alopecia, nausea, vomiting, and diarrhea.  We also went over to avoid grapefruit products, to take palbociclib capsules with food, and proper storage and handling of the medication.  She states that she was having some side effects early on but since that time she is tolerating palbociclib well.  Her ANC today is estimated at 1100 cells/L which is down from 1200 at her last visit.  Her AST has also increased to 94 units/L, and her platelets have decreased to approximately 123k.  We will continue to monitor.  She has about 3 days left before  she starts her off week of palbociclib.  She states that she is having no issues with obtaining this [facility].  We reviewed that since she does have some possible toxicities from the palbociclib, we would recommend having her be seen in 4 weeks when she is next due for her fulvestrant.  She also receives every 12-week denosumab 120 mg which is next due on 06/15/22.  We will have her see Dr. Lindi Adie with labs on 05/18/22 and she will follow-up with clinical pharmacy on 06/15/22.  Dose Modifications As above, patient is now on a reduced dose of palbociclib 75 mg by mouth daily with food 21 days on, 7 days off  Access Assessment Kathryn Lucas will be [facility] Insurance Concerns: None Start date if known: 12/25/21  Allergies Allergies  Allergen Reactions   Percocet [Oxycodone-Acetaminophen] Nausea And Vomiting    Severe GI upset and vomiting    Vitals    04/20/2022    2:02 PM 03/23/2022   12:34 PM 02/23/2022    3:00 PM  Oncology Vitals  Height 178 cm    Weight 68.13 kg 66.044 kg   Weight (lbs) 150 lbs 3 oz 145 lbs 10 oz   BMI 21.55 kg/m2   21.55 kg/m2 20.89 kg/m2   20.89 kg/m2   Temp 97.7 F (36.5 C) 98.8 F (37.1 C)   Pulse Rate 114 108   BP 109/75 110/79 114/77  Resp  18 22   SpO2 99 % 99 %   BSA (m2) 1.83 m2   1.83 m2 1.81 m2   1.81 m2      Laboratory Data    Latest Ref Rng & Units 04/20/2022    1:44 PM 02/23/2022    2:31 PM 01/26/2022    2:50 PM  CBC EXTENDED  WBC 4.0 - 10.5 K/uL 1.7  2.0  1.1   RBC 3.87 - 5.11 MIL/uL 3.72  3.91  4.14   Hemoglobin 12.0 - 15.0 g/dL 13.1  12.4  12.7   HCT 36.0 - 46.0 % 37.2  36.6  36.9   Platelets 150 - 400 K/uL 123  214  114   NEUT# 1.7 - 7.7 K/uL 1.1  1.2  0.8   Lymph# 0.7 - 4.0 K/uL 0.4  0.5  0.1        Latest Ref Rng & Units 04/20/2022    1:44 PM 03/20/2022    5:11 PM 02/23/2022    2:31 PM  CMP  Glucose 70 - 99 mg/dL 83   100   BUN 8 - 23 mg/dL 15   7    Creatinine 0.44 - 1.00 mg/dL 0.70  0.70  0.61   Sodium 135 - 145 mmol/L 135   135   Potassium 3.5 - 5.1 mmol/L 4.3   3.5   Chloride 98 - 111 mmol/L 105   104   CO2 22 - 32 mmol/L 25   23   Calcium 8.9 - 10.3 mg/dL 9.4   8.9   Total Protein 6.5 - 8.1 g/dL 6.3   6.7   Total Bilirubin 0.3 - 1.2 mg/dL 0.6   0.6   Alkaline Phos 38 - 126 U/L 74   110   AST 15 - 41 U/L 94   84   ALT 0 - 44 U/L 41   72    No results found for: "MG" No results found for: "CA2729"   Contraindications Contraindications were reviewed?  Yes Contraindications to therapy were identified?  No  Safety Precautions The following safety precautions for the use of palbociclib were reviewed:  Fever: reviewed the importance of having a thermometer and the Centers for Disease Control and Prevention (CDC) definition of fever which is 100.18F (38C) or higher. Patient should call 24/7 triage at [phone] if experiencing a fever or any other symptoms Myelosuppression ILD / Pneumonitis Fatigue Alopecia N/V/D Storage and handling Missed doses  Medication Reconciliation Current Outpatient Medications  Medication Sig Dispense Refill   anastrozole (ARIMIDEX) 1 MG tablet Take 1 tablet (1 mg total) by mouth daily. 90 tablet 3   Calcium 500-100 MG-UNIT CHEW Chew 1 tablet by mouth daily. 60 tablet    cholecalciferol (VITAMIN D3) 25 MCG (1000 UNIT) tablet Take 1 tablet (1,000 Units total) by mouth daily.     levothyroxine (SYNTHROID, LEVOTHROID) 175 MCG tablet Take 112 mcg by mouth.      vitamin C (ASCORBIC ACID) 250 MG tablet Take 1 tablet (250 mg total) by mouth daily.     zinc gluconate 50 MG tablet Take 1 tablet (50 mg total) by mouth daily.     gabapentin (NEURONTIN) 300 MG capsule Take 300 mg by mouth 3 (three) times daily. (Patient not taking: Reported on 04/20/2022)     LORazepam (ATIVAN) 0.5 MG tablet Take 1 tablet (0.5 mg total) by mouth at bedtime. (Patient not taking: Reported on 04/20/2022) 30 tablet 0    ondansetron (ZOFRAN) 8 MG tablet Take 1  tablet (8 mg total) by mouth every 8 (eight) hours as needed for nausea. (Patient not taking: Reported on 04/20/2022) 30 tablet 3   palbociclib (IBRANCE) 75 MG capsule Take 1 capsule (75 mg total) by mouth daily with breakfast. Take whole with food. Take for 21 days. (As directed by MD) (Patient not taking: Reported on 04/20/2022) 21 capsule 6   No current facility-administered medications for this visit.    Medication reconciliation is based on the patient's most recent medication list in the electronic medical record (EMR) including herbal products and OTC medications.   The patient's medication list was reviewed today with the patient?  Yes  Drug-drug interactions (DDIs) DDIs were evaluated?  Yes Significant DDIs identified?  No  Drug-Food Interactions Drug-food interactions were evaluated?  Yes Drug-food interactions identified? Yes, patient must avoid grapefruit products  Follow-up Plan  Continue palbociclib 75 mg by mouth with food, 21 days on, 7 days off.  This dose will not be increased. Continue fulvestrant 500 mg intramuscularly every 28 days.  Dose to be given today and next due on 05/18/22 Continue anastrozole 1 mg by mouth daily Continue denosumab 120 mg subcutaneously every 12 weeks.  Next due 06/15/22 She continues to follow with palliative care. Next appointment on 05/18/22. Monitor LFTs, ANC, and platelets.  Hold for grade 3 toxicities. Will add labs, Dr. Lindi Adie visit, on 05/18/22 Will add labs, pharmacy clinic visit, on 06/15/22.  At that time, if she is tolerating palbociclib well, can consider every 30-monthlabs to coincide with her denosumab injection.  AEbony Cargoparticipated in the discussion, expressed understanding, and voiced agreement with the above plan. All questions were answered to her satisfaction. The patient was advised to contact the clinic at [phone] with any questions or concerns prior to her return visit.   I  spent 30 minutes assessing the patient.  JRaina Mina RPH-CPP, 04/20/2022 2:38 PM  **Disclaimer: This note was dictated with voice recognition software. Similar sounding words can inadvertently be transcribed and this note may contain transcription errors which may not have been corrected upon publication of note.**

## 2022-04-21 ENCOUNTER — Ambulatory Visit: Payer: Commercial Managed Care - PPO | Admitting: Podiatry

## 2022-04-21 ENCOUNTER — Ambulatory Visit: Payer: Commercial Managed Care - PPO | Admitting: Pharmacist

## 2022-04-21 NOTE — Progress Notes (Signed)
[facility]       Telephone: [phone]?Fax: [phone]   Oncology Clinical Pharmacist Practitioner Progress Note  Kathryn Lucas is a 62 y.o. female with a diagnosis of metastatic breast cancer currently on palbociclib + fulvestrant + anastrozole + denosumab 120 mg under the care of Dr. Nicholas Lose.   I connected with Kathryn Lucas today by telephone and verified that I was speaking with the correct person using two patient identifiers.   Other persons participating in the visit and their role in the encounter: none   Patient's location: home  Provider's location: clinic  Kathryn Lucas reached out to clinical pharmacy because she had additional questions regarding her labs yesterday, specifically her AST level and her ANC. We discussed yesterday what would be considered Grade 3 toxicities per CTCAE v5.0 for both values because this is when the manufacturers of palbociclib would recommend holding therapy. She is currently on the lowest dose and yesterday had about 3 days left prior to starting her off week.  Today we again reviewed that these labs will be closely monitored and that the reasons for the abnormalities are likely multi-factorial but could be due to palbociclib. Dr. Lindi Lucas will see her again on 05/18/22 with labs and clinical pharmacy will see her on 06/15/22 with labs. We also discussed that there are two other agents in the class that could be considered should she need to stop therapy. She knows to contact Dr. Geralyn Lucas clinic sooner with any questions or concerns in the interim.  Kathryn Lucas participated in the discussion, expressed understanding, and voiced agreement with the above plan. All questions were answered to her satisfaction. The patient was advised to contact the clinic at [phone] with any questions or concerns prior to her return visit.  Clinical pharmacy will continue to support Kathryn Lucas and Dr. Nicholas Lose as needed.  [name], RPH-CPP,  04/21/2022  12:31 PM   **Disclaimer: This note was dictated with voice recognition software. Similar sounding words can inadvertently be transcribed and this note may contain transcription errors which may not have been corrected upon publication of note.**

## 2022-04-22 ENCOUNTER — Other Ambulatory Visit: Payer: Commercial Managed Care - PPO

## 2022-04-22 ENCOUNTER — Telehealth: Payer: Self-pay | Admitting: Pharmacist

## 2022-04-22 VITALS — BP 110/80 | HR 112 | Temp 97.6°F

## 2022-04-22 DIAGNOSIS — Z515 Encounter for palliative care: Secondary | ICD-10-CM

## 2022-04-22 NOTE — Progress Notes (Signed)
PATIENT NAME: Kathryn Lucas DOB: 01/17/1961 MRN: [id number]  PRIMARY CARE PROVIDER: Mckinley Jewel, MD  RESPONSIBLE PARTY:  Acct ID - Guarantor Home Phone Work Phone Relationship Acct Type  [id number] LEIYAH, MAULTSBY* [phone]  Self P/F     [address] [place], [place] [address]    ACP:   Patient confirms Full Code status.  She sister Francesca Jewett is designated Medical laboratory scientific officer.  Patient desires full scope medical treatment.  Appetite:  Endorses good appetite and eating 3 meals a day.  Patient reports a weight gain of 5 lbs and weight is currently 150 lbs.  Functional Status:  Currently has a brace in place to her left leg due to patella fx.  She has a rollator if needed. No recent falls. She has hired help in the home to assist.  She is mostly doing sponge baths with a caregiver.  Will attempt a shower today with aide today.  Patient is going outside to play with her dog and ambulating as much as she is able to. Has her caregiver or friend go with her when she has appointments or needs to run errands. Her goal is to improve her strength and to be more independent.   History:  Patient shares her medical history from the last year.  Continues to be seen by oncology in [place].  Safety:  Has life alert in place.  Local friends and private caregivers assist during the week and sister comes on the weekends.   Skin:  Patient requested feet be assessed.  She was previously having pain to her bilateral heels.  She reported being in the bed for longer periods of time and was dragging her left heel off the bed.  No skin breakdown or redness present.  Patient is elevated her heels when in the bed and feels her pain has improved significantly.   Patient requests 1 x monthly visit.  Next follow up scheduled for 3/7.   CODE STATUS: Full ADVANCED DIRECTIVES: N MOST FORM: No PPS: 50%   PHYSICAL EXAM:   VITALS: Today's Vitals   04/22/22 1303  BP: 110/80  Pulse: (!) 112  Temp: 97.6 F (36.4  C)  SpO2: 98%    LUNGS: clear to auscultation  CARDIAC: Cor Tachy}  EXTREMITIES: trace edema SKIN: Skin color, texture, turgor normal. No rashes or lesions or mobility and turgor normal  NEURO: positive for gait problems       Lorenza Burton, RN

## 2022-04-22 NOTE — Telephone Encounter (Signed)
Return call not yet received.   Message left for Fontaine No, Legal Assistant to [employer] [phone]).  Requested return call regarding letter.

## 2022-04-22 NOTE — Telephone Encounter (Signed)
Return call received from Lynelle Doctor [phone]) regarding Kathryn Lucas's LOA letter. "We do not use or have a form.  Need more than "checked boxes" to evaluate information further.  The leave is exhausted.  Letter is intended for the provider to provide detailed information to see where we are now and in the future.  A letter is best however I will accept information on letter office received if it is signed by provider.  The communication comment is for her to continue communicating with the store manager." This nurse has no further questions or needs ended call.

## 2022-04-22 NOTE — Progress Notes (Signed)
COMMUNITY PALLIATIVE CARE SW NOTE  PATIENT NAME: Kathryn Lucas DOB: [birth year] MRN: [id number]  PRIMARY CARE PROVIDER: Mckinley Jewel, MD  RESPONSIBLE PARTY:  Acct ID - Guarantor Home Phone Work Phone Relationship Acct Type  [id number] Kathryn Lucas, Kathryn Lucas* [phone]  Self P/F     [address], [place], [place] [address]     PLAN OF CARE and INTERVENTIONS:              GOALS OF CARE/ ADVANCE CARE PLANNING:    Goals include to maximize quality of life and assist with pain management. Our advance care planning conversation included a discussion about:    The value and importance of advance care planning  Review and updating or creation of an advance directive document.                          Patient is a Full code. Patients sister, Kathryn Lucas, has a HCPOA.   2.        SOCIAL/EMOTIONAL/SPIRITUAL ASSESSMENT/ INTERVENTIONS:         Palliative care encounter: SW and RN completed joint in home visit with patient,   Functional changes/updates: Patient with breast cancer. She is not reciving any form cancer treatment or chemo. Patient with recent knee injury. Patient is now up and able to ambulate without AD.  Cancer: breast cancer. Not receiving any treatments or radiation. Doing monthly maintenance checks with oncology.   PC will continue to monitor.    Psychosocial assessment: completed.   In home support: patient lives alone and has private caregivers coming in daily to asssit with house hold chores and running errands.  Transportation: no needs.  Food: no food insecurities witnessed.   Safety and long term planning: patient feels safe in her home and desires to remain in her home.   SW discussed goals, reviewed care plan, provided emotional support, used active and reflective listening in the form of reciprocity emotional response. Questions and concerns were addressed. The patient/family was encouraged to call with any additional questions and/or concerns. PC Provided general support  and encouragement, no other unmet needs identified. Will continue to follow.   3.         PATIENT/CAREGIVER EDUCATION/ COPING:   Appearance: well groomed, appropriate given situation  Mental Status: Alert/oriented. Eye Contact: Good. Able to engage in proper eye contact  Thought Process: rational  Thought Content: not assessed  Speech: normal  Mood: Normal and calm Affect: Congruent to endorsed mood, full ranging Insight: normal Judgement: normal  Interaction Style: Cooperative   Patient A&O, patient engaged in fluent conversation and answered all questions appropriately. No cognitive deficits witnessed. Patient has motivie mind set about medical complexity and is determined to remain active and mobile.  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    4.         PERSONAL EMERGENCY PLAN:  Patient will call 9-1-1 for emergencies.    5.         COMMUNITY RESOURCES COORDINATION/ HEALTH CARE NAVIGATION:  patients manages her own care.    6.      FINANCIAL CONCERNS/NEEDS: none                          Primary Health Insurance:  [facility] PPO Secondary Health Insurance: none Prescription Coverage: Yes, no history of difficulty obtaining or affording  prescriptions reported.     SOCIAL HX:  Social History   Tobacco Use   Smoking status: Never   Smokeless tobacco: Never  Substance Use Topics   Alcohol use: Yes    Alcohol/week: 0.0 standard drinks of alcohol    Comment: social    CODE STATUS: FULL CODE  ADVANCED DIRECTIVES: Y MOST FORM COMPLETE:  N HOSPICE EDUCATION PROVIDED: N  PPS: Patient is (I) - SUP with ADL's        [name], [place]

## 2022-04-22 NOTE — Telephone Encounter (Signed)
Scheduled and rescheduled appointments per 3/5 los. Patient is aware of the changes made to her upcoming appointments.

## 2022-04-24 ENCOUNTER — Telehealth: Payer: Self-pay | Admitting: *Deleted

## 2022-04-24 ENCOUNTER — Ambulatory Visit: Payer: Commercial Managed Care - PPO | Admitting: Pharmacist

## 2022-04-24 NOTE — Progress Notes (Signed)
[facility]       Telephone: [phone]?Fax: [phone]   Oncology Clinical Pharmacist Practitioner Progress Note  Kathryn Lucas is a 62 y.o. female with a diagnosis of metastatic breast cancer currently on palbociclib + fulvestrant + anastrozole + denosumab 120 mg under the care of Dr. Nicholas Lose.   I connected with [name] today by telephone and verified that I was speaking with the correct person using two patient identifiers.   Other persons participating in the visit and their role in the encounter: none   Patient's location: home  Provider's location: clinic  Clinical pharmacy reached out to Ms. Bruni after receiving a message from Dr. Geralyn Flash nurse that the patient was having issues obtaining her [facility].  We reviewed that the prescription is active and that Dr. Lindi Adie sent this original prescription on 03/20/22 with 6 refills.  After discussing this with Ms. Martzall, she stated that the main issue is due to getting the claim put together for palbociclib.  She stated that Allena Katz from [employer] is helping put the claim together and that once this is completed, she believes [facility] will send the palbociclib.  We discussed that if there is any issues with the prescription, that [facility] can contact Dr. Geralyn Flash clinic for assistance.  We did discuss that we have an email confirmation that [facility] did receive that prescription on 03/20/22 at 8:27 AM and it should be active.  Ms. Stoops has a follow-up appointment with Dr. Lindi Adie on 05/18/22 with labs and knows to contact the clinic sooner for any questions or concerns and may arise in the interim.  [name] participated in the discussion, expressed understanding, and voiced agreement with the above plan. All questions were answered to her satisfaction. The patient was advised to contact the clinic at [phone] with any questions or concerns prior to her return  visit.  Clinical pharmacy will continue to support [name] and Dr. Nicholas Lose as needed.  [name], RPH-CPP,  04/24/2022  10:26 AM   **Disclaimer: This note was dictated with voice recognition software. Similar sounding words can inadvertently be transcribed and this note may contain transcription errors which may not have been corrected upon publication of note.**

## 2022-04-24 NOTE — Telephone Encounter (Signed)
On 04/23/2022 This nurse prepared information requested by [employer].  Questions ask provider's expected date patient able to return to work, level of certainty of this date and, accommodations needed upon return.  This nurse unable to express levels of certainty.  These questions are outside nursing scope of practice.  Initial question asks to list all reasons why patient currently is unable to work.     04/24/2022 Connected with [employer] about Disability Medicaid as 04/16/2022 telephone encounter reads Kathryn Lucas has been approved for [employer].  Also expressed concern to maintain her employment and benefits to continue treatment.  Should try however many variables affect determination.  Advised connecting with [facility] staff, [employer] for further information.      04/24/2022 Notified today's collaborative nurse of above information, thus this nurse will communicate with provider upon return to advise and needed revisions.

## 2022-04-24 NOTE — Telephone Encounter (Signed)
Late entry for 04/23/2022: "Kathryn Lucas, [phone] (home)  calling to provide date for the letter.  It was November 13th I brought form in signed November 28th by the doctor.  Use November 28th as my return to work date.  I am having trouble standing or walking for long periods of time." Noted low CBC lab results during call.  Advised this nurse will answered questions on letter received.  Provider expected date of return to office to review, revise and sign for return to [employer].  Currently no further questions or needs.

## 2022-04-24 NOTE — Telephone Encounter (Signed)
Patient states she has spoken several times with [facility] regarding getting Ibrance shipped and is not sure it will be here in time.  She is in the 7 days off part of 21 days on/7 days off and is to restart medication on Wednesday 04/29/22.  Kathryn Lucas said she has spoken with Allena Katz of [employer] and he is assisting her to arrange shipment.  She will call office on Monday to inform if the medication will be shipped in time for her to start on Wednesday.

## 2022-04-27 ENCOUNTER — Telehealth: Payer: Self-pay

## 2022-04-27 ENCOUNTER — Encounter: Payer: Self-pay | Admitting: Hematology and Oncology

## 2022-04-27 NOTE — Telephone Encounter (Signed)
Called pt to f/u on Ibrance. Pt states she was able to get Ibrance ordered and it should be here by tomorrow.  She knows to call with any further concerns or questions.

## 2022-04-27 NOTE — Telephone Encounter (Signed)
Reviewed [employer] letter with medical provider requesting amendments where necessary.  This nurse answered questions as "Likely or probable dependent upon treatment response".  Verbal order received and read back to return request with November 2024 for return to work; currently do not know exactly as RTW is dependent upon treatment response.  Currently no amendments.  Letter signed by provider in concordance.  Successfully returned via fax [phone]) and e-mail [employer] provided, [email or web address].

## 2022-04-29 NOTE — Telephone Encounter (Signed)
Returned call to [employer] .[phone] (home) as requested to report completed [employer] request providing estimated date of November 2024.  Asked if [employer] review does not go as she desires, has she considered applying for Disability Insurance.   Thanked this nurse, requesting [place] contact information for assistance.    "Recently approved for SSA-D at 100%.  Taking a break calling because I called every day.  Need to thank the lady who helped me get approved."  Advised, this nurse routing message to [facility] staff to reach out to [employer].   Currently no further questions or needs.

## 2022-04-30 ENCOUNTER — Telehealth: Payer: Self-pay | Admitting: *Deleted

## 2022-04-30 ENCOUNTER — Other Ambulatory Visit: Payer: Commercial Managed Care - PPO

## 2022-04-30 DIAGNOSIS — Z515 Encounter for palliative care: Secondary | ICD-10-CM

## 2022-04-30 NOTE — Telephone Encounter (Signed)
Received call from pt with complaint of new right leg pain and swelling.  Pt states she has a hx of left knee cap pain and feels that she is more weight bearing on the right leg which could be causing her symptoms. Pt denies redness or warmth to RLE.  Pt requesting [facility] visit for further evaluation.  Pt states she lives in [place] and has transportation issues.  RN sent email to transportation team to set up transportation assistance. [facility] appt scheduled and pt verbalized understanding of appt details.

## 2022-04-30 NOTE — Progress Notes (Signed)
PATIENT NAME: Kathryn Lucas DOB: Apr 19, 1960 MRN: [id number]  PRIMARY CARE PROVIDER: Mckinley Jewel, MD  RESPONSIBLE PARTY:  Acct ID - Guarantor Home Phone Work Phone Relationship Acct Type  [id number] TANYIKA, [id number]  Self P/F     [address], [place], [place] [address]    I connected with  [name] on 04/30/22 by telephone and verified that I am speaking with the correct person using two identifiers.   I discussed the limitations of evaluation and management by telemedicine. The patient expressed understanding and agreed to proceed.   Message received from patient requesting a call back.  Patient advised her right knee appears swollen and tight.  She has been favoring this leg more due to the fracture on her left knee.  Encouraged rest and elevation of legs.  She is using an ace wrap to support her knee better.  Encouraged ice as needed.  Patient encouraged to follow up with PCP if this worsens.  Follow up visit scheduled for next month.    Lorenza Burton, RN

## 2022-05-01 ENCOUNTER — Telehealth: Payer: Self-pay

## 2022-05-01 ENCOUNTER — Encounter: Payer: Commercial Managed Care - PPO | Admitting: Physician Assistant

## 2022-05-01 ENCOUNTER — Inpatient Hospital Stay: Payer: Commercial Managed Care - PPO | Admitting: Physician Assistant

## 2022-05-01 ENCOUNTER — Telehealth: Payer: Self-pay | Admitting: *Deleted

## 2022-05-01 ENCOUNTER — Other Ambulatory Visit: Payer: Self-pay | Admitting: *Deleted

## 2022-05-01 DIAGNOSIS — Z170 Estrogen receptor positive status [ER+]: Secondary | ICD-10-CM

## 2022-05-01 DIAGNOSIS — C7951 Secondary malignant neoplasm of bone: Secondary | ICD-10-CM

## 2022-05-01 DIAGNOSIS — C50912 Malignant neoplasm of unspecified site of left female breast: Secondary | ICD-10-CM

## 2022-05-01 NOTE — Telephone Encounter (Signed)
I contacted patient, [place] Medicaid application completed, sent to patient to be signed.

## 2022-05-01 NOTE — Telephone Encounter (Signed)
Received call from pt requesting to cancel [facility] visit today.  Pt states swelling and pian in right leg has resolved.  Pt requesting referral to [facility] for PT and strength training.  Verbal orders received from MD and placed.

## 2022-05-01 NOTE — Telephone Encounter (Signed)
Hi,  I have spoken with the patient as well as [place] [employer]. We have completed the application, and I am in the process of sending it to the patient to be signed.

## 2022-05-01 NOTE — Telephone Encounter (Signed)
Hello,  Kathryn Lucas will reach out to her this afternoon to complete her [employer] Medicaid application.   Thanks, [employer]

## 2022-05-06 ENCOUNTER — Ambulatory Visit: Payer: Commercial Managed Care - PPO | Attending: Hematology and Oncology

## 2022-05-06 DIAGNOSIS — M25611 Stiffness of right shoulder, not elsewhere classified: Secondary | ICD-10-CM | POA: Insufficient documentation

## 2022-05-06 DIAGNOSIS — M25612 Stiffness of left shoulder, not elsewhere classified: Secondary | ICD-10-CM

## 2022-05-06 DIAGNOSIS — R29898 Other symptoms and signs involving the musculoskeletal system: Secondary | ICD-10-CM | POA: Insufficient documentation

## 2022-05-06 DIAGNOSIS — M6281 Muscle weakness (generalized): Secondary | ICD-10-CM | POA: Insufficient documentation

## 2022-05-06 DIAGNOSIS — C7951 Secondary malignant neoplasm of bone: Secondary | ICD-10-CM | POA: Diagnosis not present

## 2022-05-06 DIAGNOSIS — M25562 Pain in left knee: Secondary | ICD-10-CM | POA: Diagnosis present

## 2022-05-06 DIAGNOSIS — M436 Torticollis: Secondary | ICD-10-CM | POA: Insufficient documentation

## 2022-05-06 DIAGNOSIS — R262 Difficulty in walking, not elsewhere classified: Secondary | ICD-10-CM | POA: Insufficient documentation

## 2022-05-06 DIAGNOSIS — M542 Cervicalgia: Secondary | ICD-10-CM | POA: Diagnosis present

## 2022-05-06 NOTE — Therapy (Unsigned)
OUTPATIENT PHYSICAL THERAPY NEURO EVALUATION   Patient Name: Kathryn Lucas MRN: [id number] DOB:October 30, 1960, 62 y.o., female Today's Date: 05/07/2022   PCP: Early Osmond, MD REFERRING PROVIDER: Nicholas Lose, MD  END OF SESSION:  PT End of Session - 05/07/22 1057     Visit Number 1    Number of Visits 24    Date for PT Re-Evaluation 07/29/22    Authorization Type [employer]    PT Start Time [phone]    PT Stop Time 1030    PT Time Calculation (min) 56 min    Equipment Utilized During Treatment Gait belt    Activity Tolerance Patient tolerated treatment well    Behavior During Therapy WFL for tasks assessed/performed             Past Medical History:  Diagnosis Date   Breast cancer ([place])    left breast cancer   Cancer ([place]) 09/2017   left breast cancer   Family history of breast cancer    Hypothyroidism    Personal history of radiation therapy 2019   Thyroid disease    Past Surgical History:  Procedure Laterality Date   BREAST BIOPSY Left 2018   BREAST BIOPSY Left 2019   BREAST RECONSTRUCTION WITH PLACEMENT OF TISSUE EXPANDER AND ALLODERM Left 10/26/2017   Procedure: LEFT BREAST RECONSTRUCTION WITH PLACEMENT OF TISSUE EXPANDER AND ALLODERM;  Surgeon: Irene Limbo, MD;  Location: [facility];  Service: Plastics;  Laterality: Left;   MASTECTOMY Left 2019   MASTECTOMY WITH RADIOACTIVE SEED GUIDED EXCISION AND AXILLARY SENTINEL LYMPH NODE BIOPSY Left 10/26/2017   Procedure: LEFT MASTECTOMY WITH SEED TARGETED  LEFT AXILLARY LYMPH NODE EXCISION AND LEFT SENTINEL LYMPH NODE BIOPSY;  Surgeon: Stark Klein, MD;  Location: [facility];  Service: General;  Laterality: Left;   TONSILLECTOMY     WISDOM TOOTH EXTRACTION     Patient Active Problem List   Diagnosis Date Noted   Metastatic malignant neoplasm ([place]) 12/19/2021   Family history of breast cancer    History of therapeutic radiation 04/27/2018   Breast cancer, left breast ([place])  10/26/2017   Malignant neoplasm of lower-outer quadrant of left breast of female, estrogen receptor positive ([place]) 07/15/2016   Plantar fasciitis 07/30/2015   Metatarsalgia 10/18/2014    ONSET DATE: September 2023  REFERRING DIAG: C79.51 (ICD-10-CM) - Malignant neoplasm metastatic to bone ([place])   THERAPY DIAG:  Muscle weakness (generalized)  Stiffness of cervical spine  Painful cervical ROM  Difficulty in walking, not elsewhere classified  Acute pain of left knee  Leg weakness, bilateral  Decreased range of motion of left shoulder  Decreased range of motion of right shoulder  Rationale for Evaluation and Treatment: Rehabilitation  SUBJECTIVE:  SUBJECTIVE STATEMENT:  Pt is a 62 y.o. female who states that she requested to be seen by PT in order to increase her endurance and strength.  Pt states she currently has bone cancer that is present most in her neck and pelvis making it difficult and painful for those joints to mobilize.  Pt notes that she had a fall back in September that caused her to have a transverse fracture of the patella and is expected to have surgery to correct.  Pt states she would like to improve her strength prior to having the surgery so that she will be able to re-cooperate and rehab the knee.  Pt does have some edema present in the R LE following the fracture of the patella on the L LE.  Pt is hoping that she can regain mobility of the neck and shoulder region as well as those are limited by pain and weakness as well.  Pt accompanied by:  caregiver, [name]  PERTINENT HISTORY:   Pt is 62 y.o. female that is seeking therapy for weakness of the R LE.  Pt tripped over the threshold of her door back in September of 2023, and had a transverse fracture of the L patella when she fell  on it.  She was recently diagnosed with bone cancer back in October of 2023.  Pt notes that she has more stiffness in the neck and pelvis region because the cancer is located in those locations.  Pt is currently utilizing a hinged knee brace that she received from a friend for support of the L knee.   PAIN:  Are you having pain? No  PRECAUTIONS: Other: Cancer  WEIGHT BEARING RESTRICTIONS: Pt denies having any weightbearing restrictions on the knee  FALLS: Has patient fallen in last 6 months? Yes. Number of falls 1  LIVING ENVIRONMENT: Lives with: lives alone Lives in: House/apartment Stairs: No Has following equipment at home: Gilford Rile - 4 wheeled, shower chair, and elevated toilet with bars on the side.  Pt is looking at putting in grab bars in the shower, but has assistant that helps with showers.  PLOF: Independent  PATIENT GOALS: To get stronger, be able to walk better.    OBJECTIVE:   DIAGNOSTIC FINDINGS:   CLINICAL DATA:  Left knee pain   EXAM: LEFT KNEE - 1-2 VIEW  COMPARISON:  October 2022  FINDINGS: Nonunion of left patellar fracture. No new acute fracture. Degenerative changes of the knee demonstrated by subchondral lucencies of the distal femur. Visualized soft tissues are unremarkable.  IMPRESSION: Nonunion of left patellar fracture.   COGNITION: Overall cognitive status: Within functional limits for tasks assessed   SENSATION: WFL   POSTURE: No Significant postural limitations.  Pt is however unable to rotate her neck or move her hips as much due to the cancer in the pt.     LOWER EXTREMITY ROM:     Active  Right Eval Left Eval  Knee flexion 85 deg deferred  Knee extension 17 deg lacking  deferred    LOWER EXTREMITY MMT:    MMT Right Eval Left Eval  Hip flexion 4 limited by pain 4+  Knee flexion 4 deferred  Knee extension 4 deferred    UPPER EXTREMITY ROM:  Active ROM Right eval Left eval  Shoulder flexion 62 deg 99 deg   Shoulder scaption 60 deg 56 deg    UPPER EXTREMITY MMT:  MMT Right eval Left eval  Shoulder flexion 3+ with pain 4  Shoulder abduction 3+ with  pain 4  Grip strength 28# 38#     BED MOBILITY:  Supine to sit Modified independence, extra time necessary.   TRANSFERS: Assistive device utilized: None  Sit to stand: Modified independence Stand to sit: Modified independence Chair to chair: Modified independence     GAIT: Gait pattern: step through pattern, decreased step length- Right, decreased stance time- Left, and decreased stride length Distance walked: 40' Assistive device utilized: None Level of assistance: Modified independence Comments: Pt ambulates with significant stiffness due to the hinged knee brace on the L LE as well as the stiffness that she has in her joints from the cancer.  FUNCTIONAL TESTS:  5 times sit to stand: 23.43 sec Timed up and go (TUG): 23.33 sec 2 minute walk test: defer to next visit 10 meter walk test: 18.33 sec Berg Balance Scale: defer to next visit  PATIENT SURVEYS:  FOTO 46/59  TODAY'S TREATMENT: DATE: 05/07/22   Eval Only  Secure Chat with referring provider Nicholas Lose, MD, and asked for clarification on any precautions were present due to the pt's cancer diagnosis  Pt is cleared to have modalities performed including dry needling and heat applied per MD.    PATIENT EDUCATION: Education details: Pt educated on role of PT and services provided during current POC, along with prognosis and information about the clinic.  Person educated: Patient and Runner, broadcasting/film/video method: Explanation Education comprehension: verbalized understanding  HOME EXERCISE PROGRAM:  Not given at this current time, will plan to given at next visit.   GOALS:  Goals reviewed with patient? Yes  SHORT TERM GOALS: Target date: 06/03/2022  Pt will be independent with HEP in order to demonstrate increased ability to perform tasks related to  occupation/hobbies. Baseline: Not given HEP at initial evaluation. Goal status: INITIAL   LONG TERM GOALS: Target date: 07/29/2022  Patient will demonstrate improved function as evidenced by a score of 59 on FOTO measure for full participation in activities at home and in the community. Baseline: Evaluation: 46 Goal status: INITIAL  2.  Patient to demonstrate increased cervical rotation ROM to be 65 deg in order to return to PLOF and improving safety while driving. Baseline: Not assessed during initial evaluation Goal status: INITIAL  3.  Pt to improve B shoulder ROM to be WFL (Flexion: 120 deg; Abduction: 130 deg) in order to improve ability to complete tasks Baseline: R/L Shoulder Flex: 62/99; R/L Shoulder Scaption: 60/56 Goal status: INITIAL  4.  Pt to improve overall strength of the LE's in order to ambulate for longer distances and be able to tolerate surgery to repair the L knee. Baseline: Global 4/5 strength in the R LE, L LE MMT deferred due to the patella being broken at this time. Goal status: INITIAL  5.  Patient (> 40 years old) will complete five times sit to stand test in < 15 seconds indicating an increased LE strength and improved balance. Baseline: 23.43 sec Goal status: INITIAL   6.  Patient will reduce timed up and go to <11 seconds to reduce fall risk and demonstrate improved transfer/gait ability. Baseline: 23.33 sec Goal status: INITIAL      ASSESSMENT:  CLINICAL IMPRESSION:  Patient is a 62 y.o. female who was seen today for physical therapy evaluation and treatment for treatment for malignant neoplasm metastatic to bone.  Pt presents with physical impairments of decreased activity tolerance, decreased ROM of cervical spine, B shoulders, and LE's, increased pain in L knee, R knee, cervical region, and  decreased strength in B LE's and B UE's as noted above.  Pt was independent prior to having the fall and being diagnosed with the bone cancer that she is  currently being treated for.  Pt will benefit from skilled therapy to address tolerance, ROM, pain, and strength impairments necessary for improvement in quality of life.  Pt. demonstrates understanding of this plan of care and agrees with this plan.    OBJECTIVE IMPAIRMENTS: Abnormal gait, decreased activity tolerance, decreased balance, decreased endurance, decreased knowledge of use of DME, decreased mobility, difficulty walking, decreased ROM, decreased strength, hypomobility, impaired flexibility, impaired UE functional use, and pain  ACTIVITY LIMITATIONS: carrying, lifting, bending, sitting, standing, squatting, stairs, transfers, bed mobility, bathing, toileting, dressing, reach over head, hygiene/grooming, and locomotion level  PARTICIPATION LIMITATIONS: meal prep, cleaning, laundry, driving, shopping, community activity, occupation, and yard work  PERSONAL FACTORS: Time since onset of injury/illness/exacerbation and 3+ comorbidities: Hx of breast cancer, bone cancer, thyroid disease  are also affecting patient's functional outcome.   REHAB POTENTIAL: Fair due to multiple joints needing to be targeted and pt's current bone cancer that is causing her joints to stiffen and become more rigid.    CLINICAL DECISION MAKING: Evolving/moderate complexity  EVALUATION COMPLEXITY: Moderate  PLAN:  PT FREQUENCY: 2x/week  PT DURATION: 12 weeks  PLANNED INTERVENTIONS: Therapeutic exercises, Therapeutic activity, Neuromuscular re-education, Balance training, Gait training, Patient/Family education, Self Care, Joint mobilization, Stair training, Vestibular training, Canalith repositioning, DME instructions, Aquatic Therapy, Dry Needling, Spinal mobilization, Cryotherapy, Moist heat, Manual therapy, and Re-evaluation  PLAN FOR NEXT SESSION:   Assess cervical ROM and update goal associated with it, BERG balance test.  Establish HEP with whatever pt identifies as most pressing issue (shoulder vs  neck, vs LE) and treat with strengthening or stretching program to assist with ROM and pain modulation.     Gwenlyn Saran, PT, DPT Physical Therapist - [facility]  05/07/22, 1:32 PM

## 2022-05-07 NOTE — Addendum Note (Signed)
Addended by: Christie Nottingham on: 05/07/2022 04:23 PM   Modules accepted: Orders

## 2022-05-12 ENCOUNTER — Ambulatory Visit: Payer: Commercial Managed Care - PPO

## 2022-05-14 ENCOUNTER — Ambulatory Visit: Payer: Commercial Managed Care - PPO

## 2022-05-14 DIAGNOSIS — M25611 Stiffness of right shoulder, not elsewhere classified: Secondary | ICD-10-CM

## 2022-05-14 DIAGNOSIS — M25562 Pain in left knee: Secondary | ICD-10-CM

## 2022-05-14 DIAGNOSIS — M436 Torticollis: Secondary | ICD-10-CM

## 2022-05-14 DIAGNOSIS — M542 Cervicalgia: Secondary | ICD-10-CM

## 2022-05-14 DIAGNOSIS — R29898 Other symptoms and signs involving the musculoskeletal system: Secondary | ICD-10-CM

## 2022-05-14 DIAGNOSIS — M6281 Muscle weakness (generalized): Secondary | ICD-10-CM

## 2022-05-14 DIAGNOSIS — M25612 Stiffness of left shoulder, not elsewhere classified: Secondary | ICD-10-CM

## 2022-05-14 DIAGNOSIS — R262 Difficulty in walking, not elsewhere classified: Secondary | ICD-10-CM

## 2022-05-14 NOTE — Therapy (Signed)
OUTPATIENT PHYSICAL THERAPY NEURO TREATMENT   Patient Name: Kathryn Lucas MRN: [id number] DOB:April 21, 1960, 62 y.o., female Today's Date: 05/14/2022   PCP: Early Osmond, MD REFERRING PROVIDER: Nicholas Lose, MD  END OF SESSION:  PT End of Session - 05/14/22 1435     Visit Number 2    Number of Visits 24    Date for PT Re-Evaluation 07/29/22    Authorization Type [employer]    PT Start Time [id number]    PT Stop Time 1515    PT Time Calculation (min) 43 min    Equipment Utilized During Treatment Gait belt    Activity Tolerance Patient tolerated treatment well    Behavior During Therapy WFL for tasks assessed/performed              Past Medical History:  Diagnosis Date   Breast cancer ([place])    left breast cancer   Cancer ([place]) 09/2017   left breast cancer   Family history of breast cancer    Hypothyroidism    Personal history of radiation therapy 2019   Thyroid disease    Past Surgical History:  Procedure Laterality Date   BREAST BIOPSY Left 2018   BREAST BIOPSY Left 2019   BREAST RECONSTRUCTION WITH PLACEMENT OF TISSUE EXPANDER AND ALLODERM Left 10/26/2017   Procedure: LEFT BREAST RECONSTRUCTION WITH PLACEMENT OF TISSUE EXPANDER AND ALLODERM;  Surgeon: Irene Limbo, MD;  Location: [facility];  Service: Plastics;  Laterality: Left;   MASTECTOMY Left 2019   MASTECTOMY WITH RADIOACTIVE SEED GUIDED EXCISION AND AXILLARY SENTINEL LYMPH NODE BIOPSY Left 10/26/2017   Procedure: LEFT MASTECTOMY WITH SEED TARGETED  LEFT AXILLARY LYMPH NODE EXCISION AND LEFT SENTINEL LYMPH NODE BIOPSY;  Surgeon: Stark Klein, MD;  Location: [facility];  Service: General;  Laterality: Left;   TONSILLECTOMY     WISDOM TOOTH EXTRACTION     Patient Active Problem List   Diagnosis Date Noted   Metastatic malignant neoplasm ([place]) 12/19/2021   Family history of breast cancer    History of therapeutic radiation 04/27/2018   Breast cancer, left breast ([place])  10/26/2017   Malignant neoplasm of lower-outer quadrant of left breast of female, estrogen receptor positive ([place]) 07/15/2016   Plantar fasciitis 07/30/2015   Metatarsalgia 10/18/2014    ONSET DATE: September 2023  REFERRING DIAG: C79.51 (ICD-10-CM) - Malignant neoplasm metastatic to bone ([place])   THERAPY DIAG:  Muscle weakness (generalized)  Stiffness of cervical spine  Painful cervical ROM  Difficulty in walking, not elsewhere classified  Acute pain of left knee  Leg weakness, bilateral  Decreased range of motion of left shoulder  Decreased range of motion of right shoulder  Rationale for Evaluation and Treatment: Rehabilitation  SUBJECTIVE:  SUBJECTIVE STATEMENT:  Pt reports that she is doing well since last visit.  Pt states she needs her brace straps adjusted in order to get it better fitted.    Pt accompanied by:  caregiver, [name]  PERTINENT HISTORY:   Pt is 62 y.o. female that is seeking therapy for weakness of the R LE.  Pt tripped over the threshold of her door back in September of 2023, and had a transverse fracture of the L patella when she fell on it.  She was recently diagnosed with bone cancer back in October of 2023.  Pt notes that she has more stiffness in the neck and pelvis region because the cancer is located in those locations.  Pt is currently utilizing a hinged knee brace that she received from a friend for support of the L knee.  Secure Chat with referring provider Nicholas Lose, MD, and asked for clarification on any precautions were present due to the pt's cancer diagnosis  Pt is cleared to have modalities performed including dry needling and heat applied per MD.   PAIN:  Are you having pain? No  PRECAUTIONS: Other: Cancer  WEIGHT BEARING RESTRICTIONS: Pt  denies having any weightbearing restrictions on the knee  FALLS: Has patient fallen in last 6 months? Yes. Number of falls 1  LIVING ENVIRONMENT: Lives with: lives alone Lives in: House/apartment Stairs: No Has following equipment at home: Gilford Rile - 4 wheeled, shower chair, and elevated toilet with bars on the side.  Pt is looking at putting in grab bars in the shower, but has assistant that helps with showers.  PLOF: Independent  PATIENT GOALS: To get stronger, be able to walk better.    OBJECTIVE:   DIAGNOSTIC FINDINGS:   CLINICAL DATA:  Left knee pain   EXAM: LEFT KNEE - 1-2 VIEW  COMPARISON:  October 2022  FINDINGS: Nonunion of left patellar fracture. No new acute fracture. Degenerative changes of the knee demonstrated by subchondral lucencies of the distal femur. Visualized soft tissues are unremarkable.  IMPRESSION: Nonunion of left patellar fracture.   COGNITION: Overall cognitive status: Within functional limits for tasks assessed   SENSATION: WFL   POSTURE: No Significant postural limitations.  Pt is however unable to rotate her neck or move her hips as much due to the cancer in the pt.    CERVICAL ROM:   Active ROM A/PROM (deg) eval  Flexion 36  Extension 16  Right lateral flexion 11  Left lateral flexion 10  Right rotation 16  Left rotation 26   (Blank rows = not tested)   LOWER EXTREMITY ROM:     Active  Right Eval Left Eval  Knee flexion 85 deg deferred  Knee extension 17 deg lacking  deferred    LOWER EXTREMITY MMT:    MMT Right Eval Left Eval  Hip flexion 4 limited by pain 4+  Knee flexion 4 deferred  Knee extension 4 deferred    UPPER EXTREMITY ROM:  Active ROM Right eval Left eval  Shoulder flexion 62 deg 99 deg  Shoulder scaption 60 deg 56 deg    UPPER EXTREMITY MMT:  MMT Right eval Left eval  Shoulder flexion 3+ with pain 4  Shoulder abduction 3+ with pain 4  Grip strength 28# 38#     BED MOBILITY:   Supine to sit Modified independence, extra time necessary.   TRANSFERS: Assistive device utilized: None  Sit to stand: Modified independence Stand to sit: Modified independence Chair to chair: Modified independence  GAIT: Gait pattern: step through pattern, decreased step length- Right, decreased stance time- Left, and decreased stride length Distance walked: 40' Assistive device utilized: None Level of assistance: Modified independence Comments: Pt ambulates with significant stiffness due to the hinged knee brace on the L LE as well as the stiffness that she has in her joints from the cancer.  FUNCTIONAL TESTS:  5 times sit to stand: 23.43 sec Timed up and go (TUG): 23.33 sec 2 minute walk test: defer to next visit 10 meter walk test: 18.33 sec Berg Balance Scale: 43/56    PATIENT SURVEYS:  FOTO 46/59  TODAY'S TREATMENT: DATE: 05/14/22    TherEx:  Generated HEP and given to pt.  Pt demonstrated as performed below: Standing R shoulder isometrics into small green physioball at the wall, x15 each direction  Neuro:  Goal assessment continued with BERG and assessment of the cervical spine Adjustment of the knee brace while gathering subjective information as necessary    PATIENT EDUCATION: Education details: Pt educated on role of PT and services provided during current POC, along with prognosis and information about the clinic.  Person educated: Patient and Runner, broadcasting/film/video method: Explanation Education comprehension: verbalized understanding  HOME EXERCISE PROGRAM:  Access Code: [email or web address] URL: [email or web address] Date: 05/14/2022 Prepared by:  [employer]  Exercises - Isometric Shoulder Flexion at Wall  - 1 x daily - 7 x weekly - 3 sets - 10 reps - Isometric Shoulder Extension at Wall  - 1 x daily - 7 x weekly - 3 sets - 10 reps - Isometric Shoulder Abduction at Wall  - 1 x daily - 7 x weekly - 3 sets - 10 reps - Isometric  Shoulder Adduction  - 1 x daily - 7 x weekly - 3 sets - 10 reps   GOALS:  Goals reviewed with patient? Yes  SHORT TERM GOALS: Target date: 06/03/2022  Pt will be independent with HEP in order to demonstrate increased ability to perform tasks related to occupation/hobbies. Baseline: Not given HEP at initial evaluation. Goal status: INITIAL   LONG TERM GOALS: Target date: 07/29/2022  Patient will demonstrate improved function as evidenced by a score of 59 on FOTO measure for full participation in activities at home and in the community. Baseline: Evaluation: 46 Goal status: INITIAL  2.  Patient to demonstrate increased cervical rotation ROM to be 65 deg in order to return to PLOF and improving safety while driving. Baseline: 16/26 deg Goal status: INITIAL  3.  Pt to improve B shoulder ROM to be WFL (Flexion: 120 deg; Abduction: 130 deg) in order to improve ability to complete tasks Baseline: R/L Shoulder Flex: 62/99; R/L Shoulder Scaption: 60/56 Goal status: INITIAL  4.  Pt to improve overall strength of the LE's in order to ambulate for longer distances and be able to tolerate surgery to repair the L knee. Baseline: Global 4/5 strength in the R LE, L LE MMT deferred due to the patella being broken at this time. Goal status: INITIAL  5.  Patient (> 39 years old) will complete five times sit to stand test in < 15 seconds indicating an increased LE strength and improved balance. Baseline: 23.43 sec Goal status: INITIAL   6.  Patient will reduce timed up and go to <11 seconds to reduce fall risk and demonstrate improved transfer/gait ability. Baseline: 23.33 sec Goal status: INITIAL  7.  Pt will improve BERG by at least 3 points in order to demonstrate clinically significant improvement in balance.  Baseline: 43 Goal status: INITIAL    ASSESSMENT:  CLINICAL IMPRESSION:  Pt responded well to the treatment, noting her ability to perform the exercise without having any extra  pain while performing.  Pt continued with the assessment portion of the evaluation as well.  Pt does exhibit some balance deficits due to inability to mobilize the neck and other joints.  Pt also needed assistance with the straps of her knee brace and it was adjusted while subjective information was gathered.   Pt will continue to benefit from skilled therapy to address remaining deficits in order to improve overall QoL and return to PLOF.       OBJECTIVE IMPAIRMENTS: Abnormal gait, decreased activity tolerance, decreased balance, decreased endurance, decreased knowledge of use of DME, decreased mobility, difficulty walking, decreased ROM, decreased strength, hypomobility, impaired flexibility, impaired UE functional use, and pain  ACTIVITY LIMITATIONS: carrying, lifting, bending, sitting, standing, squatting, stairs, transfers, bed mobility, bathing, toileting, dressing, reach over head, hygiene/grooming, and locomotion level  PARTICIPATION LIMITATIONS: meal prep, cleaning, laundry, driving, shopping, community activity, occupation, and yard work  PERSONAL FACTORS: Time since onset of injury/illness/exacerbation and 3+ comorbidities: Hx of breast cancer, bone cancer, thyroid disease  are also affecting patient's functional outcome.   REHAB POTENTIAL: Fair due to multiple joints needing to be targeted and pt's current bone cancer that is causing her joints to stiffen and become more rigid.    CLINICAL DECISION MAKING: Evolving/moderate complexity  EVALUATION COMPLEXITY: Moderate  PLAN:  PT FREQUENCY: 2x/week  PT DURATION: 12 weeks  PLANNED INTERVENTIONS: Therapeutic exercises, Therapeutic activity, Neuromuscular re-education, Balance training, Gait training, Patient/Family education, Self Care, Joint mobilization, Stair training, Vestibular training, Canalith repositioning, DME instructions, Aquatic Therapy, Dry Needling, Spinal mobilization, Cryotherapy, Moist heat, Manual therapy, and  Re-evaluation  PLAN FOR NEXT SESSION:     Establish HEP with whatever pt identifies as most pressing issue (shoulder vs neck, vs LE) and treat with strengthening or stretching program to assist with ROM and pain modulation.  2MWT needed   Gwenlyn Saran, PT, DPT Physical Therapist - [facility]  05/14/22, 5:56 PM

## 2022-05-14 NOTE — Progress Notes (Signed)
Patient Care Team: Mckinley Jewel, MD as PCP - General (Internal Medicine) Nicholas Lose, MD as Consulting Physician (Hematology and Oncology) Stark Klein, MD as Consulting Physician (General Surgery) Kyung Rudd, MD as Consulting Physician (Radiation Oncology)  DIAGNOSIS: No diagnosis found.  SUMMARY OF ONCOLOGIC HISTORY: Oncology History  Malignant neoplasm of lower-outer quadrant of left breast of female, estrogen receptor positive ([place])  06/11/2016 Mammogram   Palpable left breast masses 3:00 position: 2.2 cm; 5:30 position: 2.5 cm; 6:30 position: 0.7 cm   06/19/2016 Initial Diagnosis   Left breast biopsy 3:30: IDC with DCIS grade 1, ER 90%, PR 50%, Ki-67 15%, HER-2 negative ratio 1.13; biopsy 5:30 position: IDC grade 1   07/13/2016 Breast MRI   Large area of abnormal enhancement lower inner and lower outer quadrants left breast spanning 9 cm x 6.4 cm x 5.3 cm, no abnormal enlarged lymph nodes; T3 N0 stage II a (New AJCC staging)    07/15/2016 - 12/08/2017 Anti-estrogen oral therapy   Neoadjuvant anastrozole 1 mg daily   07/17/2016 Oncotype testing   Testing done on the biopsy: Oncotype DX score 22, intermediate risk   02/02/2017 Breast MRI   Left breast multicentric disease unchanged measuring 2.7 x 1.6 cm.  Mass in the non-mass enhancement are also not significantly changed measuring 6.2 x 2.4 cm. new enhancing mass within the outer right breast 7 mm which could be fat necrosis or inclusion cyst    02/09/2017 Imaging   Ultrasound of the right breast lesion noted on MRI: No sonographic finding corresponds to the abnormality noted on MRI   07/13/2017 Cancer Staging   Staging form: Breast, AJCC 8th Edition - Clinical stage from 07/13/2017: Stage IIA (cT3, cN0, cM0, G1, ER+, PR+, HER2-) - Signed by Gardenia Phlegm, NP on 05/18/2018   10/26/2017 Surgery   Left mastectomy: IDC grade 1, 2 foci largest spans 8.5 cm, intermediate grade DCIS, lymphovascular invasion identified,  perineural invasion identified, 1/2 lymph nodes positive with extracapsular extension, ER 9200%, PR 5 to 50%, HER-2 negative, Ki-67 10 to 15%, T3N1A Mammaprint: low risk   11/02/2017 Cancer Staging   Staging form: Breast, AJCC 8th Edition - Pathologic: No Stage Recommended (ypT3, pN1a, cM0, G1, ER+, PR+, HER2-) - Signed by Nicholas Lose, MD on 11/02/2017   12/08/2017 - 01/26/2018 Radiation Therapy   Adjuvant radiation therapy    02/2018 -  Anti-estrogen oral therapy   Anastrozole 1 mg daily adjuvant therapy     CHIEF COMPLIANT: Follow-up left breast cancer/ Faslodex and Zometa  INTERVAL HISTORY: Kathryn Lucas is a 62 y.o. with above-mentioned history of left breast cancer treated with mastectomy, radiation therapy, and is currently on anti-estrogen therapy with anastrozole. She presents to the clinic for labs and follow-up.    ALLERGIES:  is allergic to percocet [oxycodone-acetaminophen].  MEDICATIONS:  Current Outpatient Medications  Medication Sig Dispense Refill   anastrozole (ARIMIDEX) 1 MG tablet Take 1 tablet (1 mg total) by mouth daily. 90 tablet 3   Calcium 500-100 MG-UNIT CHEW Chew 1 tablet by mouth daily. 60 tablet    cholecalciferol (VITAMIN D3) 25 MCG (1000 UNIT) tablet Take 1 tablet (1,000 Units total) by mouth daily.     gabapentin (NEURONTIN) 300 MG capsule Take 300 mg by mouth 3 (three) times daily. (Patient not taking: Reported on 04/20/2022)     levothyroxine (SYNTHROID, LEVOTHROID) 175 MCG tablet Take 112 mcg by mouth.      LORazepam (ATIVAN) 0.5 MG tablet Take 1 tablet (0.5 mg  total) by mouth at bedtime. (Patient not taking: Reported on 04/20/2022) 30 tablet 0   ondansetron (ZOFRAN) 8 MG tablet Take 1 tablet (8 mg total) by mouth every 8 (eight) hours as needed for nausea. (Patient not taking: Reported on 04/20/2022) 30 tablet 3   palbociclib (IBRANCE) 75 MG capsule Take 1 capsule (75 mg total) by mouth daily with breakfast. Take whole with food. Take for 21 days. (As  directed by MD) 21 capsule 6   vitamin C (ASCORBIC ACID) 250 MG tablet Take 1 tablet (250 mg total) by mouth daily.     zinc gluconate 50 MG tablet Take 1 tablet (50 mg total) by mouth daily.     No current facility-administered medications for this visit.    PHYSICAL EXAMINATION: ECOG PERFORMANCE STATUS: {CHL ONC ECOG [phone]}  There were no vitals filed for this visit. There were no vitals filed for this visit.  BREAST:*** No palpable masses or nodules in either right or left breasts. No palpable axillary supraclavicular or infraclavicular adenopathy no breast tenderness or nipple discharge. (exam performed in the presence of a chaperone)  LABORATORY DATA:  I have reviewed the data as listed    Latest Ref Rng & Units 04/20/2022    1:44 PM 03/20/2022    5:11 PM 02/23/2022    2:31 PM  CMP  Glucose 70 - 99 mg/dL 83   100   BUN 8 - 23 mg/dL 15   7   Creatinine 0.44 - 1.00 mg/dL 0.70  0.70  0.61   Sodium 135 - 145 mmol/L 135   135   Potassium 3.5 - 5.1 mmol/L 4.3   3.5   Chloride 98 - 111 mmol/L 105   104   CO2 22 - 32 mmol/L 25   23   Calcium 8.9 - 10.3 mg/dL 9.4   8.9   Total Protein 6.5 - 8.1 g/dL 6.3   6.7   Total Bilirubin 0.3 - 1.2 mg/dL 0.6   0.6   Alkaline Phos 38 - 126 U/L 74   110   AST 15 - 41 U/L 94   84   ALT 0 - 44 U/L 41   72     Lab Results  Component Value Date   WBC 1.7 (L) 04/20/2022   HGB 13.1 04/20/2022   HCT 37.2 04/20/2022   MCV 100.0 04/20/2022   PLT 123 (L) 04/20/2022   NEUTROABS 1.1 (L) 04/20/2022    ASSESSMENT & PLAN:  No problem-specific Assessment & Plan notes found for this encounter.    No orders of the defined types were placed in this encounter.  The patient has a good understanding of the overall plan. she agrees with it. she will call with any problems that may develop before the next visit here. Total time spent: 30 mins including face to face time and time spent for planning, charting and co-ordination of care   Suzzette Righter, [place] 05/14/22    I Gardiner Coins am acting as a Education administrator for [employer]  ***

## 2022-05-18 ENCOUNTER — Inpatient Hospital Stay: Payer: Commercial Managed Care - PPO

## 2022-05-18 ENCOUNTER — Inpatient Hospital Stay (HOSPITAL_BASED_OUTPATIENT_CLINIC_OR_DEPARTMENT_OTHER): Payer: Commercial Managed Care - PPO | Admitting: Hematology and Oncology

## 2022-05-18 ENCOUNTER — Inpatient Hospital Stay: Payer: Commercial Managed Care - PPO | Admitting: Nurse Practitioner

## 2022-05-18 ENCOUNTER — Inpatient Hospital Stay: Payer: Commercial Managed Care - PPO | Attending: Hematology and Oncology

## 2022-05-18 VITALS — BP 134/79 | HR 98 | Temp 98.2°F | Resp 16 | Wt 151.5 lb

## 2022-05-18 DIAGNOSIS — R5383 Other fatigue: Secondary | ICD-10-CM | POA: Diagnosis not present

## 2022-05-18 DIAGNOSIS — Z923 Personal history of irradiation: Secondary | ICD-10-CM | POA: Diagnosis not present

## 2022-05-18 DIAGNOSIS — Z170 Estrogen receptor positive status [ER+]: Secondary | ICD-10-CM

## 2022-05-18 DIAGNOSIS — Z9012 Acquired absence of left breast and nipple: Secondary | ICD-10-CM | POA: Insufficient documentation

## 2022-05-18 DIAGNOSIS — Z5111 Encounter for antineoplastic chemotherapy: Secondary | ICD-10-CM | POA: Insufficient documentation

## 2022-05-18 DIAGNOSIS — Z79811 Long term (current) use of aromatase inhibitors: Secondary | ICD-10-CM | POA: Diagnosis not present

## 2022-05-18 DIAGNOSIS — C799 Secondary malignant neoplasm of unspecified site: Secondary | ICD-10-CM

## 2022-05-18 DIAGNOSIS — D72819 Decreased white blood cell count, unspecified: Secondary | ICD-10-CM | POA: Diagnosis not present

## 2022-05-18 DIAGNOSIS — R197 Diarrhea, unspecified: Secondary | ICD-10-CM | POA: Insufficient documentation

## 2022-05-18 DIAGNOSIS — Z79899 Other long term (current) drug therapy: Secondary | ICD-10-CM | POA: Insufficient documentation

## 2022-05-18 DIAGNOSIS — K5900 Constipation, unspecified: Secondary | ICD-10-CM | POA: Insufficient documentation

## 2022-05-18 DIAGNOSIS — C50512 Malignant neoplasm of lower-outer quadrant of left female breast: Secondary | ICD-10-CM | POA: Diagnosis present

## 2022-05-18 DIAGNOSIS — C7951 Secondary malignant neoplasm of bone: Secondary | ICD-10-CM | POA: Diagnosis present

## 2022-05-18 LAB — CBC WITH DIFFERENTIAL (CANCER CENTER ONLY)
Abs Immature Granulocytes: 0 10*3/uL (ref 0.00–0.07)
Basophils Absolute: 0 10*3/uL (ref 0.0–0.1)
Basophils Relative: 2 %
Eosinophils Absolute: 0 10*3/uL (ref 0.0–0.5)
Eosinophils Relative: 1 %
HCT: 34.1 % — ABNORMAL LOW (ref 36.0–46.0)
Hemoglobin: 12.1 g/dL (ref 12.0–15.0)
Immature Granulocytes: 0 %
Lymphocytes Relative: 27 %
Lymphs Abs: 0.4 10*3/uL — ABNORMAL LOW (ref 0.7–4.0)
MCH: 36.2 pg — ABNORMAL HIGH (ref 26.0–34.0)
MCHC: 35.5 g/dL (ref 30.0–36.0)
MCV: 102.1 fL — ABNORMAL HIGH (ref 80.0–100.0)
Monocytes Absolute: 0.2 10*3/uL (ref 0.1–1.0)
Monocytes Relative: 13 %
Neutro Abs: 0.8 10*3/uL — ABNORMAL LOW (ref 1.7–7.7)
Neutrophils Relative %: 57 %
Platelet Count: 114 10*3/uL — ABNORMAL LOW (ref 150–400)
RBC: 3.34 MIL/uL — ABNORMAL LOW (ref 3.87–5.11)
RDW: 14.6 % (ref 11.5–15.5)
WBC Count: 1.4 10*3/uL — ABNORMAL LOW (ref 4.0–10.5)
nRBC: 0 % (ref 0.0–0.2)

## 2022-05-18 LAB — CMP (CANCER CENTER ONLY)
ALT: 21 U/L (ref 0–44)
AST: 89 U/L — ABNORMAL HIGH (ref 15–41)
Albumin: 3.8 g/dL (ref 3.5–5.0)
Alkaline Phosphatase: 68 U/L (ref 38–126)
Anion gap: 5 (ref 5–15)
BUN: 20 mg/dL (ref 8–23)
CO2: 25 mmol/L (ref 22–32)
Calcium: 9.1 mg/dL (ref 8.9–10.3)
Chloride: 106 mmol/L (ref 98–111)
Creatinine: 0.73 mg/dL (ref 0.44–1.00)
GFR, Estimated: >60 mL/min (ref >60)
Glucose, Bld: 82 mg/dL (ref 70–99)
Potassium: 4.2 mmol/L (ref 3.5–5.1)
Sodium: 136 mmol/L (ref 135–145)
Total Bilirubin: 0.5 mg/dL (ref 0.3–1.2)
Total Protein: 6.5 g/dL (ref 6.5–8.1)

## 2022-05-18 MED ORDER — FULVESTRANT 250 MG/5ML IM SOSY
500.0000 mg | PREFILLED_SYRINGE | Freq: Once | INTRAMUSCULAR | Status: AC
  Administered 2022-05-18: 500 mg via INTRAMUSCULAR
  Filled 2022-05-18: qty 10

## 2022-05-18 NOTE — Assessment & Plan Note (Signed)
06/19/2016 Left breast biopsy 3:30: IDC with DCIS grade 1, ER 90%, PR 50%, Ki-67 15%, HER-2 negative ratio 1.13; biopsy 5:30 position: IDC grade 1   10/26/17: Left mastectomy: IDC grade 1, 2 foci largest spans 8.5 cm, intermediate grade DCIS, lymphovascular invasion identified, perineural invasion identified, 1/2 lymph nodes positive with extracapsular extension, ER 9200%, PR 5 to 50%, HER-2 negative, Ki-67 10 to 15%, T3N1A   Oncotype DX score 22, intermediate risk, chemotherapy not felt to have significant benefit.   Treatment Summary: 1. Antiestrogen therapy with anastrozole 1 mg daily started 07/15/2016 2. Mastectomy 10/26/2017, Mammaprint low risk luminal type A 3. Followed by adjuvant radiation 12/08/17- 01/26/18  4. Followed by adjuvant antiestrogen therapy anastrozole started 01/17/2018 (originally started 07/15/2016) -------------------------------------------------------------------- Low back pain August 2023: Underwent CT myelogram: Large expansile lesion in the sacrum with extraosseous extension of the tumor, diffuse lytic lesions throughout the visualized spine with metastatic disease myeloma is considered less likely.  (This was ordered by Dr. Melrose Nakayama)   Treatment plan: 1.  PET CT scan 12/27/2021: Widespread bone metastatic disease largest lesion involving the sacrum with pathological fractures of T6 and T10 retroperitoneal and pelvic lymph node metastasis, right axillary lymph node, activity in the pancreatic head, hypermetabolic activity in the adrenal glands, right thyroid nodule. 2. biopsy of sacrum: 12/26/2021: Metastatic breast cancer, ER 90%, PR 10%, HER2 negative (0) 3.  Treatment plan: Ibrance along with Faslodex started 12/25/2021 4.  Xgeva for bone metastases.  Every 3 months 5.  Palliative radiation to the sacrum completed 01/19/2022 Genetic testing for BRCA  analysis. -------------------------------------------------------------------------------------------------------------------------------------- Current treatment: Ibrance with Faslodex and Zometa, started 12/25/2021 Toxicities: Leukopenia: Currently on Ibrance 75 mg.   Fatigue Alternating constipation and diarrhea Severe bone pain: Patient is not taking any pain medications currently because the pain is now under very good control without it.   CT chest 03/20/21: Stable disease in the multiple areas of the bone, pancreatic lesion, liver lesion, kidney lesions, adrenal lesions, lymph nodes: Stable Today she receives Xgeva injection as well.  Return to clinic in 1 month for labs, scans and follow-up

## 2022-05-18 NOTE — Progress Notes (Deleted)
[facility]  Telephone:([phone]) [phone] [phone]) [phone]   Name: Kathryn Lucas Date: 05/18/2022 MRN: [id number]  DOB: October 09, 1960  Patient Care Team: Mckinley Jewel, MD as PCP - General (Internal Medicine) Nicholas Lose, MD as Consulting Physician (Hematology and Oncology) Stark Klein, MD as Consulting Physician (General Surgery) Kyung Rudd, MD as Consulting Physician (Radiation Oncology)   INTERVAL HISTORY: Kathryn Lucas is a 62 y.o. 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 Recent diagnosis (10/2021) of large sacral lesion with diffuse lytic lesions throughout the spine. Palliative ask to see for symptom management and goals of care.   SOCIAL HISTORY:     reports that she has never smoked. She has never used smokeless tobacco. She reports current alcohol use. She reports that she does not use drugs.  ADVANCE DIRECTIVES:    CODE STATUS:   PAST MEDICAL HISTORY: Past Medical History:  Diagnosis Date   Breast cancer ([place])    left breast cancer   Cancer ([place]) 09/2017   left breast cancer   Family history of breast cancer    Hypothyroidism    Personal history of radiation therapy 2019   Thyroid disease     ALLERGIES:  is allergic to percocet [oxycodone-acetaminophen].  MEDICATIONS:  Current Outpatient Medications  Medication Sig Dispense Refill   anastrozole (ARIMIDEX) 1 MG tablet Take 1 tablet (1 mg total) by mouth daily. 90 tablet 3   Calcium 500-100 MG-UNIT CHEW Chew 1 tablet by mouth daily. 60 tablet    cholecalciferol (VITAMIN D3) 25 MCG (1000 UNIT) tablet Take 1 tablet (1,000 Units total) by mouth daily.     gabapentin (NEURONTIN) 300 MG capsule Take 300 mg by mouth 3 (three) times daily. (Patient not taking: Reported on 04/20/2022)     levothyroxine (SYNTHROID, LEVOTHROID) 175 MCG tablet Take 112 mcg by mouth.      LORazepam (ATIVAN) 0.5 MG  tablet Take 1 tablet (0.5 mg total) by mouth at bedtime. (Patient not taking: Reported on 04/20/2022) 30 tablet 0   ondansetron (ZOFRAN) 8 MG tablet Take 1 tablet (8 mg total) by mouth every 8 (eight) hours as needed for nausea. (Patient not taking: Reported on 04/20/2022) 30 tablet 3   palbociclib (IBRANCE) 75 MG capsule Take 1 capsule (75 mg total) by mouth daily with breakfast. Take whole with food. Take for 21 days. (As directed by MD) 21 capsule 6   vitamin C (ASCORBIC ACID) 250 MG tablet Take 1 tablet (250 mg total) by mouth daily.     zinc gluconate 50 MG tablet Take 1 tablet (50 mg total) by mouth daily.     No current facility-administered medications for this visit.    VITAL SIGNS: There were no vitals taken for this visit. There were no vitals filed for this visit.  Estimated body mass index is 21.55 kg/m as calculated from the following:   Height as of 04/20/22: '5\' 10"'$  (1.778 m).   Weight as of 04/20/22: 150 lb 3.2 oz (68.1 kg).   PERFORMANCE STATUS (ECOG) : 1 - Symptomatic but completely ambulatory   IMPRESSION:   PLAN: Pain much improved. No longer requiring daily medications. Tylenol for mild aches and discomfort.  Colace daily Ativan as needed for sleep/anxiety.  I will plan to see patient back 6-8 weeks as needed in collaboration to other oncology appointments.    Patient expressed understanding and was in agreement with this plan.  She also understands that She can call the clinic at any time with any questions, concerns, or complaints.   Any controlled substances utilized were prescribed in the context of palliative care. PDMP has been reviewed.    Time Total: 20 min  Visit consisted of counseling and education dealing with the complex and emotionally intense issues of symptom management and palliative care in the setting of serious and potentially life-threatening illness.Greater than 50%  of this time was spent counseling and coordinating care related to the above  assessment and plan.  [name], AGPCNP-BC  Palliative Medicine Team/[place] [facility]

## 2022-05-18 NOTE — Patient Instructions (Signed)

## 2022-05-19 ENCOUNTER — Ambulatory Visit: Payer: Commercial Managed Care - PPO | Attending: Hematology and Oncology | Admitting: Physical Therapy

## 2022-05-19 DIAGNOSIS — C50512 Malignant neoplasm of lower-outer quadrant of left female breast: Secondary | ICD-10-CM | POA: Diagnosis present

## 2022-05-19 DIAGNOSIS — M25612 Stiffness of left shoulder, not elsewhere classified: Secondary | ICD-10-CM

## 2022-05-19 DIAGNOSIS — M436 Torticollis: Secondary | ICD-10-CM

## 2022-05-19 DIAGNOSIS — M6281 Muscle weakness (generalized): Secondary | ICD-10-CM | POA: Diagnosis not present

## 2022-05-19 DIAGNOSIS — Z170 Estrogen receptor positive status [ER+]: Secondary | ICD-10-CM | POA: Insufficient documentation

## 2022-05-19 DIAGNOSIS — M25562 Pain in left knee: Secondary | ICD-10-CM | POA: Diagnosis present

## 2022-05-19 DIAGNOSIS — M25611 Stiffness of right shoulder, not elsewhere classified: Secondary | ICD-10-CM | POA: Diagnosis present

## 2022-05-19 DIAGNOSIS — R262 Difficulty in walking, not elsewhere classified: Secondary | ICD-10-CM | POA: Diagnosis present

## 2022-05-19 DIAGNOSIS — R29898 Other symptoms and signs involving the musculoskeletal system: Secondary | ICD-10-CM

## 2022-05-19 DIAGNOSIS — M542 Cervicalgia: Secondary | ICD-10-CM | POA: Diagnosis present

## 2022-05-19 NOTE — Therapy (Signed)
OUTPATIENT PHYSICAL THERAPY NEURO TREATMENT   Patient Name: Kathryn Lucas MRN: [id number] DOB:Aug 26, 1960, 62 y.o., female Today's Date: 05/19/2022   PCP: Early Osmond, MD REFERRING PROVIDER: Nicholas Lose, MD  END OF SESSION:  PT End of Session - 05/19/22 1705     Visit Number 3    Number of Visits 24    Date for PT Re-Evaluation 07/29/22    Authorization Type [employer]    PT Start Time [id number]    PT Stop Time 1646    PT Time Calculation (min) 40 min    Equipment Utilized During Treatment Gait belt    Activity Tolerance Patient tolerated treatment well    Behavior During Therapy WFL for tasks assessed/performed               Past Medical History:  Diagnosis Date   Breast cancer ([place])    left breast cancer   Cancer ([place]) 09/2017   left breast cancer   Family history of breast cancer    Hypothyroidism    Personal history of radiation therapy 2019   Thyroid disease    Past Surgical History:  Procedure Laterality Date   BREAST BIOPSY Left 2018   BREAST BIOPSY Left 2019   BREAST RECONSTRUCTION WITH PLACEMENT OF TISSUE EXPANDER AND ALLODERM Left 10/26/2017   Procedure: LEFT BREAST RECONSTRUCTION WITH PLACEMENT OF TISSUE EXPANDER AND ALLODERM;  Surgeon: Irene Limbo, MD;  Location: [facility];  Service: Plastics;  Laterality: Left;   MASTECTOMY Left 2019   MASTECTOMY WITH RADIOACTIVE SEED GUIDED EXCISION AND AXILLARY SENTINEL LYMPH NODE BIOPSY Left 10/26/2017   Procedure: LEFT MASTECTOMY WITH SEED TARGETED  LEFT AXILLARY LYMPH NODE EXCISION AND LEFT SENTINEL LYMPH NODE BIOPSY;  Surgeon: Stark Klein, MD;  Location: [facility];  Service: General;  Laterality: Left;   TONSILLECTOMY     WISDOM TOOTH EXTRACTION     Patient Active Problem List   Diagnosis Date Noted   Metastatic malignant neoplasm ([place]) 12/19/2021   Family history of breast cancer    History of therapeutic radiation 04/27/2018   Breast cancer, left breast ([place])  10/26/2017   Malignant neoplasm of lower-outer quadrant of left breast of female, estrogen receptor positive ([place]) 07/15/2016   Plantar fasciitis 07/30/2015   Metatarsalgia 10/18/2014    ONSET DATE: September 2023  REFERRING DIAG: C79.51 (ICD-10-CM) - Malignant neoplasm metastatic to bone ([place])   THERAPY DIAG:  Muscle weakness (generalized)  Acute pain of left knee  Leg weakness, bilateral  Decreased range of motion of left shoulder  Decreased range of motion of right shoulder  Difficulty in walking, not elsewhere classified  Stiffness of cervical spine  Painful cervical ROM  Rationale for Evaluation and Treatment: Rehabilitation  SUBJECTIVE:  SUBJECTIVE STATEMENT:  Pt reports that she is doing well since last visit.  No Pain reports on this day but some soreness in the RUE>LUE. She states that she went to the Dr Bedelia Person, with good report on CA treatment.     Pt accompanied by:  caregiver, [name]  PERTINENT HISTORY:   Pt is 62 y.o. female that is seeking therapy for weakness of the R LE.  Pt tripped over the threshold of her door back in September of 2023, and had a transverse fracture of the L patella when she fell on it.  She was recently diagnosed with bone cancer back in October of 2023.  Pt notes that she has more stiffness in the neck and pelvis region because the cancer is located in those locations.  Pt is currently utilizing a hinged knee brace that she received from a friend for support of the L knee.  Secure Chat with referring provider Nicholas Lose, MD, and asked for clarification on any precautions were present due to the pt's cancer diagnosis  Pt is cleared to have modalities performed including dry needling and heat applied per MD.   PAIN:  Are you having pain?  No  PRECAUTIONS: Other: Cancer  WEIGHT BEARING RESTRICTIONS: Pt denies having any weightbearing restrictions on the knee  FALLS: Has patient fallen in last 6 months? Yes. Number of falls 1  LIVING ENVIRONMENT: Lives with: lives alone Lives in: House/apartment Stairs: No Has following equipment at home: Gilford Rile - 4 wheeled, shower chair, and elevated toilet with bars on the side.  Pt is looking at putting in grab bars in the shower, but has assistant that helps with showers.  PLOF: Independent  PATIENT GOALS: To get stronger, be able to walk better.    OBJECTIVE:   DIAGNOSTIC FINDINGS:   CLINICAL DATA:  Left knee pain   EXAM: LEFT KNEE - 1-2 VIEW  COMPARISON:  October 2022  FINDINGS: Nonunion of left patellar fracture. No new acute fracture. Degenerative changes of the knee demonstrated by subchondral lucencies of the distal femur. Visualized soft tissues are unremarkable.  IMPRESSION: Nonunion of left patellar fracture.   COGNITION: Overall cognitive status: Within functional limits for tasks assessed   SENSATION: WFL   POSTURE: No Significant postural limitations.  Pt is however unable to rotate her neck or move her hips as much due to the cancer in the pt.    CERVICAL ROM:   Active ROM A/PROM (deg) eval  Flexion 36  Extension 16  Right lateral flexion 11  Left lateral flexion 10  Right rotation 16  Left rotation 26   (Blank rows = not tested)   LOWER EXTREMITY ROM:     Active  Right Eval Left Eval  Knee flexion 85 deg deferred  Knee extension 17 deg lacking  deferred    LOWER EXTREMITY MMT:    MMT Right Eval Left Eval  Hip flexion 4 limited by pain 4+  Knee flexion 4 deferred  Knee extension 4 deferred    UPPER EXTREMITY ROM:  Active ROM Right eval Left eval  Shoulder flexion 62 deg 99 deg  Shoulder scaption 60 deg 56 deg    UPPER EXTREMITY MMT:  MMT Right eval Left eval  Shoulder flexion 3+ with pain 4  Shoulder  abduction 3+ with pain 4  Grip strength 28# 38#     BED MOBILITY:  Supine to sit Modified independence, extra time necessary.   TRANSFERS: Assistive device utilized: None  Sit to stand: Modified  independence Stand to sit: Modified independence Chair to chair: Modified independence     GAIT: Gait pattern: step through pattern, decreased step length- Right, decreased stance time- Left, and decreased stride length Distance walked: 40' Assistive device utilized: None Level of assistance: Modified independence Comments: Pt ambulates with significant stiffness due to the hinged knee brace on the L LE as well as the stiffness that she has in her joints from the cancer.  FUNCTIONAL TESTS:  5 times sit to stand: 23.43 sec Timed up and go (TUG): 23.33 sec 2 minute walk test: 211 10 meter walk test: 18.33 sec Berg Balance Scale: 43/56    PATIENT SURVEYS:  FOTO 46/59  TODAY'S TREATMENT: DATE: 05/19/22    Gait training Gait training through hall x 332f with no AD and CGA.  Pt performed 2 min walk test. 2124f Noted to have mild antalgic gait on the LLE with trunkal sway R and L to compensate for gluteal weakness.  Additional gait training through rehab gym 2 x 309fith supervision assist and cues for decreased trunkal sway to force activation of glute med.    TherEx:  Seated hip abduction with green tband x 12 BLE x2.  Standing hip extension x 12 with green tband Standing hip abduction x 12 with green tband  Cues for hold at end range and as well as improved posture to reduce compensation through trunk.  Sit<>stand from low arm chair throughout session x 8 with noted to position LLE forward for pain management.       PATIENT EDUCATION: Education details: Pt educated on role of PT and services provided during current POC, along with prognosis and information about the clinic.  Person educated: Patient and CarRunner, broadcasting/film/videothod: Explanation Education  comprehension: verbalized understanding  HOME EXERCISE PROGRAM:  Access Code: [email or web address] URL: [email or web address] Date: 05/19/2022 Prepared by: AusBarrie Folkxercises - Seated Hip Abduction with Resistance  - 1 x daily - 7 x weekly - 3 sets - 10 reps - Standing Hip Extension Kicks  - 1 x daily - 7 x weekly - 3 sets - 10 reps - Standing Hip Abduction Kicks  - 1 x daily - 7 x weekly - 3 sets - 10 reps  Access Code: [email or web address] URL: [email or web address] Date: 05/14/2022 Prepared by: JosRaleigh Nationxercises - Isometric Shoulder Flexion at Wall  - 1 x daily - 7 x weekly - 3 sets - 10 reps - Isometric Shoulder Extension at Wall  - 1 x daily - 7 x weekly - 3 sets - 10 reps - Isometric Shoulder Abduction at Wall  - 1 x daily - 7 x weekly - 3 sets - 10 reps - Isometric Shoulder Adduction  - 1 x daily - 7 x weekly - 3 sets - 10 reps   GOALS:  Goals reviewed with patient? Yes  SHORT TERM GOALS: Target date: 06/03/2022  Pt will be independent with HEP in order to demonstrate increased ability to perform tasks related to occupation/hobbies. Baseline: Not given HEP at initial evaluation. Goal status: INITIAL   LONG TERM GOALS: Target date: 07/29/2022  Patient will demonstrate improved function as evidenced by a score of 59 on FOTO measure for full participation in activities at home and in the community. Baseline: Evaluation: 46 Goal status: INITIAL  2.  Patient to demonstrate increased cervical rotation ROM to be 65 deg in order to return to PLOF and improving safety while driving. Baseline: 16/26 deg Goal status: INITIAL  3.  Pt  to improve B shoulder ROM to be WFL (Flexion: 120 deg; Abduction: 130 deg) in order to improve ability to complete tasks Baseline: R/L Shoulder Flex: 62/99; R/L Shoulder Scaption: 60/56 Goal status: INITIAL  4.  Pt to improve overall strength of the LE's in order to ambulate for longer distances and be able to tolerate surgery  to repair the L knee. Baseline: Global 4/5 strength in the R LE, L LE MMT deferred due to the patella being broken at this time. Goal status: INITIAL  5.  Patient (> 16 years old) will complete five times sit to stand test in < 15 seconds indicating an increased LE strength and improved balance. Baseline: 23.43 sec Goal status: INITIAL   6.  Patient will reduce timed up and go to <11 seconds to reduce fall risk and demonstrate improved transfer/gait ability. Baseline: 23.33 sec Goal status: INITIAL  7.  Pt will improve BERG by at least 3 points in order to demonstrate clinically significant improvement in balance.  Baseline: 43 Goal status: INITIAL    ASSESSMENT:  CLINICAL IMPRESSION:  Pt excited to continue therapy treatment. Pt responded well to treatment and reports feeling decreased pain in the R shoulder and slightly stronger in BLE following interventions. Pt demonstrates apprehension to perform sit<>stand form low seat height but able to complete with increasing confidence throughout session. And educated on need to perform transfers from various heights to strengthen muscle in full ROM. Pt will continue to benefit from skilled therapy to address remaining deficits in order to improve overall QoL and return to PLOF.       OBJECTIVE IMPAIRMENTS: Abnormal gait, decreased activity tolerance, decreased balance, decreased endurance, decreased knowledge of use of DME, decreased mobility, difficulty walking, decreased ROM, decreased strength, hypomobility, impaired flexibility, impaired UE functional use, and pain  ACTIVITY LIMITATIONS: carrying, lifting, bending, sitting, standing, squatting, stairs, transfers, bed mobility, bathing, toileting, dressing, reach over head, hygiene/grooming, and locomotion level  PARTICIPATION LIMITATIONS: meal prep, cleaning, laundry, driving, shopping, community activity, occupation, and yard work  PERSONAL FACTORS: Time since onset of  injury/illness/exacerbation and 3+ comorbidities: Hx of breast cancer, bone cancer, thyroid disease  are also affecting patient's functional outcome.   REHAB POTENTIAL: Fair due to multiple joints needing to be targeted and pt's current bone cancer that is causing her joints to stiffen and become more rigid.    CLINICAL DECISION MAKING: Evolving/moderate complexity  EVALUATION COMPLEXITY: Moderate  PLAN:  PT FREQUENCY: 2x/week  PT DURATION: 12 weeks  PLANNED INTERVENTIONS: Therapeutic exercises, Therapeutic activity, Neuromuscular re-education, Balance training, Gait training, Patient/Family education, Self Care, Joint mobilization, Stair training, Vestibular training, Canalith repositioning, DME instructions, Aquatic Therapy, Dry Needling, Spinal mobilization, Cryotherapy, Moist heat, Manual therapy, and Re-evaluation  PLAN FOR NEXT SESSION:    Allow pt identifies as most pressing issue (shoulder vs neck, vs LE) and treat with strengthening or stretching program to assist with ROM and pain modulation.   Barrie Folk PT, DPT  Physical Therapist - [facility]  05/19/22, 5:08 PM

## 2022-05-21 ENCOUNTER — Other Ambulatory Visit: Payer: Commercial Managed Care - PPO

## 2022-05-21 VITALS — BP 110/74 | HR 100 | Temp 97.4°F

## 2022-05-21 DIAGNOSIS — Z515 Encounter for palliative care: Secondary | ICD-10-CM

## 2022-05-21 NOTE — Progress Notes (Signed)
PATIENT NAME: Kathryn Lucas DOB: 17-Oct-1960 MRN: [id number]  PRIMARY CARE PROVIDER: Mckinley Jewel, MD  RESPONSIBLE PARTY:  Acct ID - Guarantor Home Phone Work Phone Relationship Acct Type  [id number] BUNA, [id number]  Self P/F     [address] [place], [place] [address]   Home visit completed patient and [name], [place].   Appetite:  Patient is doing meal prep at home.  Eating well.  She continues to have private caregivers in the home 2 x weekly.  Patient advised her weight is about 152 lbs.  Orthopedic:  Patient advised she will be evaluated by orthopedic surgeon to address her left knee.  Pain:  Present to right knee, neck and right elbow.  Patient reports pain is manageable at this time and feels therapy is helping.  PT:  Patient has started with therapy since our last visit.  Therapy continues 1x weekly on Mondays. Patient reports some soreness to her right elbow from therapy.  Overall, she feels this has been very helpful and she is getting stronger.   Continues with left leg brace.   Resources:  Patient advised she has been approved for disability.  She is exploring Medicaid and continues to gather information.        CODE STATUS: Full ADVANCED DIRECTIVES: No MOST FORM: No PPS: 50%   PHYSICAL EXAM:   VITALS: Today's Vitals   05/21/22 1301  BP: 110/74  Pulse: 100  Temp: (!) 97.4 F (36.3 C)  SpO2: 99%    LUNGS: clear to auscultation  CARDIAC: Cor RRR}  EXTREMITIES: - for edema SKIN: Skin color, texture, turgor normal. No rashes or lesions or mobility and turgor normal  NEURO: positive for gait problems       Lorenza Burton, RN

## 2022-05-21 NOTE — Progress Notes (Signed)
COMMUNITY PALLIATIVE CARE SW NOTE  PATIENT NAME: Kathryn Lucas DOB: 13-Jan-1961 MRN: [id number]  PRIMARY CARE PROVIDER: Mckinley Jewel, MD  RESPONSIBLE PARTY:  Acct ID - Guarantor Home Phone Work Phone Relationship Acct Type  [id number] AUNYE, [id number]  Self P/F     [address], [place], [place] [address]     PLAN OF CARE and INTERVENTIONS:              Encounter: Follow up palliative care visit completed with patient and RN, Almyra Free.  Patient share that she is participating in outpatient PT Q Monday, which is going well.   Mobility: patient is (I) with ambulation and is able to drive.   Support: Patient is able to meal prep (I). Patient still has private caregiver 2-3x/wk . Patient has been approved for disability and is in the process of applying for medicaid.   Overall patient doing well and attempting to be as active as possible.      SOCIAL HX:  Social History   Tobacco Use   Smoking status: Never   Smokeless tobacco: Never  Substance Use Topics   Alcohol use: Yes    Alcohol/week: 0.0 standard drinks of alcohol    Comment: social    CODE STATUS: FULL CODE ADVANCED DIRECTIVES: Y MOST FORM COMPLETE: N HOSPICE EDUCATION PROVIDED: N        [name], LCSW

## 2022-05-22 ENCOUNTER — Encounter: Payer: Self-pay | Admitting: Hematology and Oncology

## 2022-05-25 ENCOUNTER — Inpatient Hospital Stay: Payer: Commercial Managed Care - PPO | Admitting: Pharmacist

## 2022-05-25 DIAGNOSIS — C50512 Malignant neoplasm of lower-outer quadrant of left female breast: Secondary | ICD-10-CM

## 2022-05-25 NOTE — Progress Notes (Signed)
[facility]       Telephone: [phone]?Fax: [phone]   Oncology Clinical Pharmacist Practitioner Progress Note  Kathryn Lucas is a 62 y.o. female with a diagnosis of metastatic breast cancer currently on palbociclib/fulvestrant/anastrozole/denosumab under the care of Dr. Nicholas Lose.   I connected with Kathryn Lucas today by telephone and verified that I was speaking with the correct person using two patient identifiers.   Other persons participating in the visit and their role in the encounter: none   Patient's location: home  Provider's location: clinic  Clinical pharmacy spoke to Kathryn Lucas today about her upcoming appointment on 06/17/22 when she is also due for labs, fulvestrant, and denosumab. Since she will be having restaging scans on 06/18/22, she asked if we could move her appointments to that following Monday (preferred day) and if she could see Dr. Lindi Adie that day since he will need to review her restaging scans. We discussed with Dr. Lindi Adie and he felt this would be reasonable. He was planning on seeing her on 06/29/22 but we will move this appointment to 06/22/22 and then clinical pharmacy will see her again 12 weeks from that day when she is next due for denosumab 120 mg.  Of note, she is starting her new cycle of palbociclib on 05/27/22 and so having labs on 06/22/22 will be right before her next cycle start date of 06/24/22.   Follow-up Plan Restaging scans scheduled for 06/18/22 Move Lab, fulvestrant injection, and denosumab injection from 06/17/22 to 06/22/22.  Move clinical pharmacy visit from 06/17/22 to 09/14/22. Add labs, denosumab, fulvestrant for 09/14/22 Move Dr. Lindi Adie visit from 06/29/22 to 06/22/22 Continue fulvestrant every 4 weeks and denosumab 120 mg every 12 weeks.  Kathryn Lucas participated in the discussion, expressed understanding, and voiced agreement with the above plan. All questions were answered to her satisfaction. The patient was advised to  contact the clinic at [phone] with any questions or concerns prior to her return visit.  Clinical pharmacy will continue to support Kathryn Lucas and Dr. Nicholas Lose as needed.  [name], RPH-CPP,  05/25/2022  12:20 PM   **Disclaimer: This note was dictated with voice recognition software. Similar sounding words can inadvertently be transcribed and this note may contain transcription errors which may not have been corrected upon publication of note.**

## 2022-05-26 ENCOUNTER — Telehealth: Payer: Self-pay | Admitting: *Deleted

## 2022-05-26 ENCOUNTER — Ambulatory Visit: Payer: Commercial Managed Care - PPO | Admitting: Physical Therapy

## 2022-05-26 DIAGNOSIS — R29898 Other symptoms and signs involving the musculoskeletal system: Secondary | ICD-10-CM

## 2022-05-26 DIAGNOSIS — M436 Torticollis: Secondary | ICD-10-CM

## 2022-05-26 DIAGNOSIS — M6281 Muscle weakness (generalized): Secondary | ICD-10-CM

## 2022-05-26 DIAGNOSIS — M25612 Stiffness of left shoulder, not elsewhere classified: Secondary | ICD-10-CM

## 2022-05-26 DIAGNOSIS — R262 Difficulty in walking, not elsewhere classified: Secondary | ICD-10-CM

## 2022-05-26 DIAGNOSIS — M25611 Stiffness of right shoulder, not elsewhere classified: Secondary | ICD-10-CM

## 2022-05-26 DIAGNOSIS — M25562 Pain in left knee: Secondary | ICD-10-CM

## 2022-05-26 NOTE — Telephone Encounter (Signed)
Received call from pt stating she was seen by Dr. Rogers Blocker with orthopedics for continuous right lower extremity swelling.  Pt states Dr. Rogers Blocker believes cancer has now spread to the soft tissue area and is being referred to [facility] orthopedic oncology for further evaluation. Pt states she will update our office once she is seen by [facility].  MD notified and verbalized understanding.

## 2022-05-26 NOTE — Therapy (Signed)
OUTPATIENT PHYSICAL THERAPY NEURO TREATMENT   Patient Name: Kathryn Lucas MRN: [id number] DOB:1960/06/09, 62 y.o., female Today's Date: 05/26/2022   PCP: Early Osmond, MD REFERRING PROVIDER: Nicholas Lose, MD  END OF SESSION:  PT End of Session - 05/26/22 1657     Visit Number 4    Number of Visits 24    Date for PT Re-Evaluation 07/29/22    Authorization Type [employer]    PT Start Time [id number]    PT Stop Time 1648    PT Time Calculation (min) 42 min    Equipment Utilized During Treatment Gait belt    Activity Tolerance Patient tolerated treatment well    Behavior During Therapy WFL for tasks assessed/performed                Past Medical History:  Diagnosis Date   Breast cancer ([place])    left breast cancer   Cancer ([place]) 09/2017   left breast cancer   Family history of breast cancer    Hypothyroidism    Personal history of radiation therapy 2019   Thyroid disease    Past Surgical History:  Procedure Laterality Date   BREAST BIOPSY Left 2018   BREAST BIOPSY Left 2019   BREAST RECONSTRUCTION WITH PLACEMENT OF TISSUE EXPANDER AND ALLODERM Left 10/26/2017   Procedure: LEFT BREAST RECONSTRUCTION WITH PLACEMENT OF TISSUE EXPANDER AND ALLODERM;  Surgeon: Irene Limbo, MD;  Location: [place];  Service: Plastics;  Laterality: Left;   MASTECTOMY Left 2019   MASTECTOMY WITH RADIOACTIVE SEED GUIDED EXCISION AND AXILLARY SENTINEL LYMPH NODE BIOPSY Left 10/26/2017   Procedure: LEFT MASTECTOMY WITH SEED TARGETED  LEFT AXILLARY LYMPH NODE EXCISION AND LEFT SENTINEL LYMPH NODE BIOPSY;  Surgeon: Stark Klein, MD;  Location: [place];  Service: General;  Laterality: Left;   TONSILLECTOMY     WISDOM TOOTH EXTRACTION     Patient Active Problem List   Diagnosis Date Noted   Metastatic malignant neoplasm ([place]) 12/19/2021   Family history of breast cancer    History of therapeutic radiation 04/27/2018   Breast cancer, left breast  ([place]) 10/26/2017   Malignant neoplasm of lower-outer quadrant of left breast of female, estrogen receptor positive ([place]) 07/15/2016   Plantar fasciitis 07/30/2015   Metatarsalgia 10/18/2014    ONSET DATE: September 2023  REFERRING DIAG: C79.51 (ICD-10-CM) - Malignant neoplasm metastatic to bone ([place])   THERAPY DIAG:  Muscle weakness (generalized)  Acute pain of left knee  Leg weakness, bilateral  Decreased range of motion of left shoulder  Decreased range of motion of right shoulder  Difficulty in walking, not elsewhere classified  Stiffness of cervical spine  Rationale for Evaluation and Treatment: Rehabilitation  SUBJECTIVE:  SUBJECTIVE STATEMENT:  Pt reports that she is doing well since last visit.  Pt reports MD visit prior to PT treatment. Possible new CA found in R knee and R shoulder per pt. Per pt, encouraged to continue with PT to improve strength and QoL. Pt reports that she has referral to specialist in near future, but unknown date. No new pain, and reports feeling stronger and more confident with walking since starting PT.     Pt accompanied by:  caregiver, [name]  PERTINENT HISTORY:   Pt is 62 y.o. female that is seeking therapy for weakness of the R LE.  Pt tripped over the threshold of her door back in September of 2023, and had a transverse fracture of the L patella when she fell on it.  She was recently diagnosed with bone cancer back in October of 2023.  Pt notes that she has more stiffness in the neck and pelvis region because the cancer is located in those locations.  Pt is currently utilizing a hinged knee brace that she received from a friend for support of the L knee.  Secure Chat with referring provider Nicholas Lose, MD, and asked for clarification on any precautions  were present due to the pt's cancer diagnosis  Pt is cleared to have modalities performed including dry needling and heat applied per MD.   PAIN:  Are you having pain? No  PRECAUTIONS: Other: Cancer  WEIGHT BEARING RESTRICTIONS: Pt denies having any weightbearing restrictions on the knee  FALLS: Has patient fallen in last 6 months? Yes. Number of falls 1  LIVING ENVIRONMENT: Lives with: lives alone Lives in: House/apartment Stairs: No Has following equipment at home: Gilford Rile - 4 wheeled, shower chair, and elevated toilet with bars on the side.  Pt is looking at putting in grab bars in the shower, but has assistant that helps with showers.  PLOF: Independent  PATIENT GOALS: To get stronger, be able to walk better.    OBJECTIVE:   DIAGNOSTIC FINDINGS:   CLINICAL DATA:  Left knee pain   EXAM: LEFT KNEE - 1-2 VIEW  COMPARISON:  October 2022  FINDINGS: Nonunion of left patellar fracture. No new acute fracture. Degenerative changes of the knee demonstrated by subchondral lucencies of the distal femur. Visualized soft tissues are unremarkable.  IMPRESSION: Nonunion of left patellar fracture.   COGNITION: Overall cognitive status: Within functional limits for tasks assessed   SENSATION: WFL   POSTURE: No Significant postural limitations.  Pt is however unable to rotate her neck or move her hips as much due to the cancer in the pt.    CERVICAL ROM:   Active ROM A/PROM (deg) eval  Flexion 36  Extension 16  Right lateral flexion 11  Left lateral flexion 10  Right rotation 16  Left rotation 26   (Blank rows = not tested)   LOWER EXTREMITY ROM:     Active  Right Eval Left Eval  Knee flexion 85 deg deferred  Knee extension 17 deg lacking  deferred    LOWER EXTREMITY MMT:    MMT Right Eval Left Eval  Hip flexion 4 limited by pain 4+  Knee flexion 4 deferred  Knee extension 4 deferred    UPPER EXTREMITY ROM:  Active ROM Right eval Left eval   Shoulder flexion 62 deg 99 deg  Shoulder scaption 60 deg 56 deg    UPPER EXTREMITY MMT:  MMT Right eval Left eval  Shoulder flexion 3+ with pain 4  Shoulder abduction 3+ with  pain 4  Grip strength 28# 38#     BED MOBILITY:  Supine to sit Modified independence, extra time necessary.   TRANSFERS: Assistive device utilized: None  Sit to stand: Modified independence Stand to sit: Modified independence Chair to chair: Modified independence     GAIT: Gait pattern: step through pattern, decreased step length- Right, decreased stance time- Left, and decreased stride length Distance walked: 40' Assistive device utilized: None Level of assistance: Modified independence Comments: Pt ambulates with significant stiffness due to the hinged knee brace on the L LE as well as the stiffness that she has in her joints from the cancer.  FUNCTIONAL TESTS:  5 times sit to stand: 23.43 sec from elevated mat table  Timed up and go (TUG): 23.33 sec 2 minute walk test: 211 10 meter walk test: 18.33 sec Berg Balance Scale: 43/56    PATIENT SURVEYS:  FOTO 46/59  TODAY'S TREATMENT: DATE: 05/26/22    Gait training Gait training through hall x 135f and through rehab gym x 260fand 3552fith no AD and supervision assist from PT. Noted to have improved posture, step length, and cadence on BLE compared to prior PT treatment. Mild shoulder sway still present, but reduced from prior session.    TherEx:  Side stepping in parallel bars x 3 bil without resistance and x 3 bil red tband at thighs.  Partial squat x 10 with BUE supported on rai;. Partial lateral lunge while hold kick ball and red tband on Bil thighs.  Instruction from PT for posture and decreased speed of eccentric movement to improve activation of target muscles  Seated  LAQ x 12 with RTB. AROM LLE due to pain when attempting with RTB  HS curl x 12 BLE, RTB   Pt performed 5 time sit<>stand (5xSTS):  31.85 sec and 23.20  (>15 sec indicates increased fall risk)   RUE shoulder slide up wall x 5 with 5 sec hold at end range. Cues to remain in relative pain free range.    PATIENT EDUCATION: Education details: Pt educated throughout session about proper posture and technique with exercises. Improved exercise technique, movement at target joints, use of target muscles after min to mod verbal, visual, tactile cues. Person educated: Patient and [employer], broadcasting/film/videothod: Explanation Education comprehension: verbalized understanding  HOME EXERCISE PROGRAM:  Access Code: [email or web address] URL: [email or web address] Date: 05/19/2022 Prepared by: AusBarrie Folkxercises - Seated Hip Abduction with Resistance  - 1 x daily - 7 x weekly - 3 sets - 10 reps - Standing Hip Extension Kicks  - 1 x daily - 7 x weekly - 3 sets - 10 reps - Standing Hip Abduction Kicks  - 1 x daily - 7 x weekly - 3 sets - 10 reps  Access Code: [email or web address] URL: [email or web address] Date: 05/14/2022 Prepared by: JosRaleigh Nationxercises - Isometric Shoulder Flexion at Wall  - 1 x daily - 7 x weekly - 3 sets - 10 reps - Isometric Shoulder Extension at Wall  - 1 x daily - 7 x weekly - 3 sets - 10 reps - Isometric Shoulder Abduction at Wall  - 1 x daily - 7 x weekly - 3 sets - 10 reps - Isometric Shoulder Adduction  - 1 x daily - 7 x weekly - 3 sets - 10 reps   GOALS:  Goals reviewed with patient? Yes  SHORT TERM GOALS: Target date: 06/03/2022  Pt will be independent with HEP in order to demonstrate increased ability to  perform tasks related to occupation/hobbies. Baseline: Not given HEP at initial evaluation. Goal status: INITIAL   LONG TERM GOALS: Target date: 07/29/2022  Patient will demonstrate improved function as evidenced by a score of 59 on FOTO measure for full participation in activities at home and in the community. Baseline: Evaluation: 46 Goal status: INITIAL  2.  Patient to demonstrate  increased cervical rotation ROM to be 65 deg in order to return to PLOF and improving safety while driving. Baseline: 16/26 deg Goal status: INITIAL  3.  Pt to improve B shoulder ROM to be WFL (Flexion: 120 deg; Abduction: 130 deg) in order to improve ability to complete tasks Baseline: R/L Shoulder Flex: 62/99; R/L Shoulder Scaption: 60/56 Goal status: INITIAL  4.  Pt to improve overall strength of the LE's in order to ambulate for longer distances and be able to tolerate surgery to repair the L knee. Baseline: Global 4/5 strength in the R LE, L LE MMT deferred due to the patella being broken at this time. Goal status: INITIAL  5.  Patient (> 39 years old) will complete five times sit to stand test in < 15 seconds indicating an increased LE strength and improved balance. Baseline: 23.43 sec Goal status: INITIAL   6.  Patient will reduce timed up and go to <11 seconds to reduce fall risk and demonstrate improved transfer/gait ability. Baseline: 23.33 sec Goal status: INITIAL  7.  Pt will improve BERG by at least 3 points in order to demonstrate clinically significant improvement in balance.  Baseline: 43 Goal status: INITIAL    ASSESSMENT:  CLINICAL IMPRESSION:  Pt noted to have improved gait pattern, posture and strength in BLE on this PT treatment. Put forth good effort throughout session towards strengthening interverntions and noted to have reduced pain in BLE and increased ROM compared to prior treatment sessions. Pt will continue to benefit from skilled therapy to address remaining deficits in order to improve overall QoL and return to PLOF.       OBJECTIVE IMPAIRMENTS: Abnormal gait, decreased activity tolerance, decreased balance, decreased endurance, decreased knowledge of use of DME, decreased mobility, difficulty walking, decreased ROM, decreased strength, hypomobility, impaired flexibility, impaired UE functional use, and pain  ACTIVITY LIMITATIONS: carrying, lifting,  bending, sitting, standing, squatting, stairs, transfers, bed mobility, bathing, toileting, dressing, reach over head, hygiene/grooming, and locomotion level  PARTICIPATION LIMITATIONS: meal prep, cleaning, laundry, driving, shopping, community activity, occupation, and yard work  PERSONAL FACTORS: Time since onset of injury/illness/exacerbation and 3+ comorbidities: Hx of breast cancer, bone cancer, thyroid disease  are also affecting patient's functional outcome.   REHAB POTENTIAL: Fair due to multiple joints needing to be targeted and pt's current bone cancer that is causing her joints to stiffen and become more rigid.    CLINICAL DECISION MAKING: Evolving/moderate complexity  EVALUATION COMPLEXITY: Moderate  PLAN:  PT FREQUENCY: 2x/week  PT DURATION: 12 weeks  PLANNED INTERVENTIONS: Therapeutic exercises, Therapeutic activity, Neuromuscular re-education, Balance training, Gait training, Patient/Family education, Self Care, Joint mobilization, Stair training, Vestibular training, Canalith repositioning, DME instructions, Aquatic Therapy, Dry Needling, Spinal mobilization, Cryotherapy, Moist heat, Manual therapy, and Re-evaluation  PLAN FOR NEXT SESSION:    Allow pt identifies as most pressing issue (shoulder vs neck, vs LE) and treat with strengthening or stretching program to assist with ROM and pain modulation.   Barrie Folk PT, DPT  Physical Therapist - [facility]  05/26/22, 5:01 PM

## 2022-05-27 ENCOUNTER — Telehealth: Payer: Self-pay | Admitting: Hematology and Oncology

## 2022-05-27 ENCOUNTER — Telehealth: Payer: Commercial Managed Care - PPO | Admitting: Hematology and Oncology

## 2022-05-27 ENCOUNTER — Telehealth: Payer: Self-pay | Admitting: *Deleted

## 2022-05-27 NOTE — Telephone Encounter (Signed)
Rescheduled, scheduled and cancelled appointments per 3/11 los. Patient is aware of all changes made to her upcoming appointments.

## 2022-05-27 NOTE — Telephone Encounter (Signed)
Received call from pt requesting telephone visit with MD to discuss recent orthopedic appt prior to proceeding with referral to [facility] orthopedic oncolgy.  Appt scheduled, pt verbalized understanding.

## 2022-05-28 ENCOUNTER — Encounter: Payer: Self-pay | Admitting: Hematology and Oncology

## 2022-05-28 ENCOUNTER — Inpatient Hospital Stay (HOSPITAL_BASED_OUTPATIENT_CLINIC_OR_DEPARTMENT_OTHER): Payer: Commercial Managed Care - PPO | Admitting: Hematology and Oncology

## 2022-05-28 DIAGNOSIS — Z170 Estrogen receptor positive status [ER+]: Secondary | ICD-10-CM

## 2022-05-28 DIAGNOSIS — C50512 Malignant neoplasm of lower-outer quadrant of left female breast: Secondary | ICD-10-CM

## 2022-05-28 NOTE — Progress Notes (Signed)
[employer] ([employer]) approved, effective dates, 01/14/2022-01/14/2023, MID # [id number] N. Patient informed.

## 2022-05-28 NOTE — Progress Notes (Signed)
HEMATOLOGY-ONCOLOGY TELEPHONE VISIT PROGRESS NOTE  I connected with our patient on 05/28/22 at 12:15 PM EDT by telephone and verified that I am speaking with the correct person using two identifiers.  I discussed the limitations, risks, security and privacy concerns of performing an evaluation and management service by telephone and the availability of in person appointments.  I also discussed with the patient that there may be a patient responsible charge related to this service. The patient expressed understanding and agreed to proceed.   History of Present Illness: Knee swelling, patient has seen orthopedic specialist who recommended referral to [place] for surgical procedure.  Patient wanted to check with [place] and see if that is necessary.  Oncology History  Malignant neoplasm of lower-outer quadrant of left breast of female, estrogen receptor positive ([place])  06/11/2016 Mammogram   Palpable left breast masses 3:00 position: 2.2 cm; 5:30 position: 2.5 cm; 6:30 position: 0.7 cm   06/19/2016 Initial Diagnosis   Left breast biopsy 3:30: IDC with DCIS grade 1, ER 90%, PR 50%, Ki-67 15%, HER-2 negative ratio 1.13; biopsy 5:30 position: IDC grade 1   07/13/2016 Breast MRI   Large area of abnormal enhancement lower inner and lower outer quadrants left breast spanning 9 cm x 6.4 cm x 5.3 cm, no abnormal enlarged lymph nodes; T3 N0 stage II a (New AJCC staging)    07/15/2016 - 12/08/2017 Anti-estrogen oral therapy   Neoadjuvant anastrozole 1 mg daily   07/17/2016 Oncotype testing   Testing done on the biopsy: Oncotype DX score 22, intermediate risk   02/02/2017 Breast MRI   Left breast multicentric disease unchanged measuring 2.7 x 1.6 cm.  Mass in the non-mass enhancement are also not significantly changed measuring 6.2 x 2.4 cm. new enhancing mass within the outer right breast 7 mm which could be fat necrosis or inclusion cyst    02/09/2017 Imaging   Ultrasound of the right breast lesion  noted on MRI: No sonographic finding corresponds to the abnormality noted on MRI   07/13/2017 Cancer Staging   Staging form: Breast, AJCC 8th Edition - Clinical stage from 07/13/2017: Stage IIA (cT3, cN0, cM0, G1, ER+, PR+, HER2-) - Signed by Gardenia Phlegm, NP on 05/18/2018   10/26/2017 Surgery   Left mastectomy: IDC grade 1, 2 foci largest spans 8.5 cm, intermediate grade DCIS, lymphovascular invasion identified, perineural invasion identified, 1/2 lymph nodes positive with extracapsular extension, ER 9200%, PR 5 to 50%, HER-2 negative, Ki-67 10 to 15%, T3N1A Mammaprint: low risk   11/02/2017 Cancer Staging   Staging form: Breast, AJCC 8th Edition - Pathologic: No Stage Recommended (ypT3, pN1a, cM0, G1, ER+, PR+, HER2-) - Signed by Nicholas Lose, MD on 11/02/2017   12/08/2017 - 01/26/2018 Radiation Therapy   Adjuvant radiation therapy    02/2018 -  Anti-estrogen oral therapy   Anastrozole 1 mg daily adjuvant therapy     REVIEW OF SYSTEMS:   Constitutional: Denies fevers, chills or abnormal weight loss All other systems were reviewed with the patient and are negative. Observations/Objective:     Assessment Plan:  Malignant neoplasm of lower-outer quadrant of left breast of female, estrogen receptor positive ([place]) 06/19/2016 Left breast biopsy 3:30: IDC with DCIS grade 1, ER 90%, PR 50%, Ki-67 15%, HER-2 negative ratio 1.13; biopsy 5:30 position: IDC grade 1   10/26/17: Left mastectomy: IDC grade 1, 2 foci largest spans 8.5 cm, intermediate grade DCIS, lymphovascular invasion identified, perineural invasion identified, 1/2 lymph nodes positive with extracapsular extension, ER 9200%,  PR 5 to 50%, HER-2 negative, Ki-67 10 to 15%, T3N1A   Oncotype DX score 22, intermediate risk, chemotherapy not felt to have significant benefit.   Treatment Summary: 1. Antiestrogen therapy with anastrozole 1 mg daily started 07/15/2016 2. Mastectomy 10/26/2017, Mammaprint low risk luminal type  A 3. Followed by adjuvant radiation 12/08/17- 01/26/18  4. Followed by adjuvant antiestrogen therapy anastrozole started 01/17/2018 (originally started 07/15/2016) -------------------------------------------------------------------- Low back pain August 2023: Underwent CT myelogram: Large expansile lesion in the sacrum with extraosseous extension of the tumor, diffuse lytic lesions throughout the visualized spine with metastatic disease myeloma is considered less likely.  (This was ordered by Dr. Melrose Nakayama)   Treatment plan: 1.  PET CT scan 12/27/2021: Widespread bone metastatic disease largest lesion involving the sacrum with pathological fractures of T6 and T10 retroperitoneal and pelvic lymph node metastasis, right axillary lymph node, activity in the pancreatic head, hypermetabolic activity in the adrenal glands, right thyroid nodule. 2. biopsy of sacrum: 12/26/2021: Metastatic breast cancer, ER 90%, PR 10%, HER2 negative (0) 3.  Treatment plan: Ibrance along with Faslodex started 12/25/2021 4.  Xgeva for bone metastases.  Every 3 months 5.  Palliative radiation to the sacrum completed 01/19/2022 Genetic testing for BRCA analysis. -------------------------------------------------------------------------------------------------------------------------------------- Current treatment: Ibrance with Faslodex and Zometa, started 12/25/2021 Toxicities: Leukopenia: Currently on Ibrance 75 mg.   Fatigue Alternating constipation and diarrhea Severe bone pain: Patient is not taking any pain medications currently because the pain is now under very good control without it.   CT chest 03/20/21: Stable disease in the multiple areas of the bone, pancreatic lesion, liver lesion, kidney lesions, adrenal lesions, lymph nodes: Stable  Knee swelling: Saw local ortho.They felt that she may have cancer in the connective tissue in the knee.  I discussed with her that she is on good treatment that should help  wherever the cancer might be.  Therefore at this time we will hold off on referring her to [place]. . Seeing PT   Scans have been scheduled for 06/18/2022   I discussed the assessment and treatment plan with the patient. The patient was provided an opportunity to ask questions and all were answered. The patient agreed with the plan and demonstrated an understanding of the instructions. The patient was advised to call back or seek an in-person evaluation if the symptoms worsen or if the condition fails to improve as anticipated.   I provided 12 minutes of non-face-to-face time during this encounter.  This includes time for charting and coordination of care   Harriette Ohara, MD

## 2022-05-28 NOTE — Assessment & Plan Note (Signed)
06/19/2016 Left breast biopsy 3:30: IDC with DCIS grade 1, ER 90%, PR 50%, Ki-67 15%, HER-2 negative ratio 1.13; biopsy 5:30 position: IDC grade 1   10/26/17: Left mastectomy: IDC grade 1, 2 foci largest spans 8.5 cm, intermediate grade DCIS, lymphovascular invasion identified, perineural invasion identified, 1/2 lymph nodes positive with extracapsular extension, ER 9200%, PR 5 to 50%, HER-2 negative, Ki-67 10 to 15%, T3N1A   Oncotype DX score 22, intermediate risk, chemotherapy not felt to have significant benefit.   Treatment Summary: 1. Antiestrogen therapy with anastrozole 1 mg daily started 07/15/2016 2. Mastectomy 10/26/2017, Mammaprint low risk luminal type A 3. Followed by adjuvant radiation 12/08/17- 01/26/18  4. Followed by adjuvant antiestrogen therapy anastrozole started 01/17/2018 (originally started 07/15/2016) -------------------------------------------------------------------- Low back pain August 2023: Underwent CT myelogram: Large expansile lesion in the sacrum with extraosseous extension of the tumor, diffuse lytic lesions throughout the visualized spine with metastatic disease myeloma is considered less likely.  (This was ordered by Dr. Melrose Nakayama)   Treatment plan: 1.  PET CT scan 12/27/2021: Widespread bone metastatic disease largest lesion involving the sacrum with pathological fractures of T6 and T10 retroperitoneal and pelvic lymph node metastasis, right axillary lymph node, activity in the pancreatic head, hypermetabolic activity in the adrenal glands, right thyroid nodule. 2. biopsy of sacrum: 12/26/2021: Metastatic breast cancer, ER 90%, PR 10%, HER2 negative (0) 3.  Treatment plan: Ibrance along with Faslodex started 12/25/2021 4.  Xgeva for bone metastases.  Every 3 months 5.  Palliative radiation to the sacrum completed 01/19/2022 Genetic testing for BRCA  analysis. -------------------------------------------------------------------------------------------------------------------------------------- Current treatment: Ibrance with Faslodex and Zometa, started 12/25/2021 Toxicities: Leukopenia: Currently on Ibrance 75 mg.   Fatigue Alternating constipation and diarrhea Severe bone pain: Patient is not taking any pain medications currently because the pain is now under very good control without it.   CT chest 03/20/21: Stable disease in the multiple areas of the bone, pancreatic lesion, liver lesion, kidney lesions, adrenal lesions, lymph nodes: Stable Today she receives Xgeva injection as well.   Scans have been scheduled for 06/18/2022

## 2022-05-29 ENCOUNTER — Telehealth: Payer: Self-pay | Admitting: *Deleted

## 2022-05-29 NOTE — Telephone Encounter (Signed)
Received call from pt with complaint of green colored stool x 2 weeks.  Pt states she has increased leafy green vegetables in her diet and is eating a lot of avocados lately.  Pt denies abdominal pain or pressure.  Per MD stool color is okay at this time.  Pt educated to contact our office if GI upset occurs or pt develops dark blood colored stools. Pt verbalized understanding.

## 2022-06-03 ENCOUNTER — Ambulatory Visit: Payer: Commercial Managed Care - PPO | Admitting: Physical Therapy

## 2022-06-03 DIAGNOSIS — M25612 Stiffness of left shoulder, not elsewhere classified: Secondary | ICD-10-CM

## 2022-06-03 DIAGNOSIS — M25562 Pain in left knee: Secondary | ICD-10-CM

## 2022-06-03 DIAGNOSIS — M25611 Stiffness of right shoulder, not elsewhere classified: Secondary | ICD-10-CM

## 2022-06-03 DIAGNOSIS — R29898 Other symptoms and signs involving the musculoskeletal system: Secondary | ICD-10-CM

## 2022-06-03 DIAGNOSIS — M6281 Muscle weakness (generalized): Secondary | ICD-10-CM | POA: Diagnosis not present

## 2022-06-03 DIAGNOSIS — M436 Torticollis: Secondary | ICD-10-CM

## 2022-06-03 DIAGNOSIS — R262 Difficulty in walking, not elsewhere classified: Secondary | ICD-10-CM

## 2022-06-03 NOTE — Therapy (Signed)
OUTPATIENT PHYSICAL THERAPY NEURO TREATMENT   Patient Name: Kathryn Lucas MRN: [id number] DOB:July 02, 1960, 62 y.o., female Today's Date: 06/03/2022   PCP: Kathryn Osmond, MD REFERRING PROVIDER: Nicholas Lose, MD  END OF SESSION:  PT End of Session - 06/03/22 1034     Visit Number 5    Number of Visits 24    Date for PT Re-Evaluation 07/29/22    Authorization Type [employer]    PT Start Time 1030    PT Stop Time 1101    PT Time Calculation (min) 31 min    Equipment Utilized During Treatment Gait belt    Activity Tolerance Patient tolerated treatment well    Behavior During Therapy WFL for tasks assessed/performed                 Past Medical History:  Diagnosis Date   Breast cancer ([place])    left breast cancer   Cancer ([place]) 09/2017   left breast cancer   Family history of breast cancer    Hypothyroidism    Personal history of radiation therapy 2019   Thyroid disease    Past Surgical History:  Procedure Laterality Date   BREAST BIOPSY Left 2018   BREAST BIOPSY Left 2019   BREAST RECONSTRUCTION WITH PLACEMENT OF TISSUE EXPANDER AND ALLODERM Left 10/26/2017   Procedure: LEFT BREAST RECONSTRUCTION WITH PLACEMENT OF TISSUE EXPANDER AND ALLODERM;  Surgeon: Kathryn Limbo, MD;  Location: [facility];  Service: Plastics;  Laterality: Left;   MASTECTOMY Left 2019   MASTECTOMY WITH RADIOACTIVE SEED GUIDED EXCISION AND AXILLARY SENTINEL LYMPH NODE BIOPSY Left 10/26/2017   Procedure: LEFT MASTECTOMY WITH SEED TARGETED  LEFT AXILLARY LYMPH NODE EXCISION AND LEFT SENTINEL LYMPH NODE BIOPSY;  Surgeon: Kathryn Klein, MD;  Location: [facility];  Service: General;  Laterality: Left;   TONSILLECTOMY     WISDOM TOOTH EXTRACTION     Patient Active Problem List   Diagnosis Date Noted   Metastatic malignant neoplasm ([place]) 12/19/2021   Family history of breast cancer    History of therapeutic radiation 04/27/2018   Breast cancer, left breast  ([place]) 10/26/2017   Malignant neoplasm of lower-outer quadrant of left breast of female, estrogen receptor positive ([place]) 07/15/2016   Plantar fasciitis 07/30/2015   Metatarsalgia 10/18/2014    ONSET DATE: September 2023  REFERRING DIAG: C79.51 (ICD-10-CM) - Malignant neoplasm metastatic to bone ([place])   THERAPY DIAG:  Muscle weakness (generalized)  Acute pain of left knee  Leg weakness, bilateral  Decreased range of motion of left shoulder  Decreased range of motion of right shoulder  Difficulty in walking, not elsewhere classified  Stiffness of cervical spine  Rationale for Evaluation and Treatment: Rehabilitation  SUBJECTIVE:  SUBJECTIVE STATEMENT:  Pt reports that she is doing well since last visit.  Mild neck stiffness on this day and generalized pain in LLE and trunk. Reports feeling stronger and more confident with getting out of chair throughout day.   Next MD appointment 06/18/22    Pt accompanied by:  caregiver, Kathryn Lucas  PERTINENT HISTORY:   Pt is 62 y.o. female that is seeking therapy for weakness of the R LE.  Pt tripped over the threshold of her door back in September of 2023, and had a transverse fracture of the L patella when she fell on it.  She was recently diagnosed with bone cancer back in October of 2023.  Pt notes that she has more stiffness in the neck and pelvis region because the cancer is located in those locations.  Pt is currently utilizing a hinged knee brace that she received from a friend for support of the L knee.  Secure Chat with referring provider Kathryn Lose, MD, and asked for clarification on any precautions were present due to the pt's cancer diagnosis  Pt is cleared to have modalities performed including dry needling and heat applied per MD.   PAIN:  Are  you having pain? No  PRECAUTIONS: Other: Cancer  WEIGHT BEARING RESTRICTIONS: Pt denies having any weightbearing restrictions on the knee  FALLS: Has patient fallen in last 6 months? Yes. Number of falls 1  LIVING ENVIRONMENT: Lives with: lives alone Lives in: House/apartment Stairs: No Has following equipment at home: Gilford Rile - 4 wheeled, shower chair, and elevated toilet with bars on the side.  Pt is looking at putting in grab bars in the shower, but has assistant that helps with showers.  PLOF: Independent  PATIENT GOALS: To get stronger, be able to walk better.    OBJECTIVE:   DIAGNOSTIC FINDINGS:   CLINICAL DATA:  Left knee pain   EXAM: LEFT KNEE - 1-2 VIEW  COMPARISON:  October 2022  FINDINGS: Nonunion of left patellar fracture. No new acute fracture. Degenerative changes of the knee demonstrated by subchondral lucencies of the distal femur. Visualized soft tissues are unremarkable.  IMPRESSION: Nonunion of left patellar fracture.   COGNITION: Overall cognitive status: Within functional limits for tasks assessed   SENSATION: WFL   POSTURE: No Significant postural limitations.  Pt is however unable to rotate her neck or move her hips as much due to the cancer in the pt.    CERVICAL ROM:   Active ROM A/PROM (deg) eval  Flexion 36  Extension 16  Right lateral flexion 11  Left lateral flexion 10  Right rotation 16  Left rotation 26   (Blank rows = not tested)   LOWER EXTREMITY ROM:     Active  Right Eval Left Eval  Knee flexion 85 deg deferred  Knee extension 17 deg lacking  deferred    LOWER EXTREMITY MMT:    MMT Right Eval Left Eval  Hip flexion 4 limited by pain 4+  Knee flexion 4 deferred  Knee extension 4 deferred    UPPER EXTREMITY ROM:  Active ROM Right eval Left eval  Shoulder flexion 62 deg 99 deg  Shoulder scaption 60 deg 56 deg    UPPER EXTREMITY MMT:  MMT Right eval Left eval  Shoulder flexion 3+ with pain 4   Shoulder abduction 3+ with pain 4  Grip strength 28# 38#     BED MOBILITY:  Supine to sit Modified independence, extra time necessary.   TRANSFERS: Assistive device utilized: None  Sit to stand: Modified independence Stand to sit: Modified independence Chair to chair: Modified independence     GAIT: Gait pattern: step through pattern, decreased step length- Right, decreased stance time- Left, and decreased stride length Distance walked: 40' Assistive device utilized: None Level of assistance: Modified independence Comments: Pt ambulates with significant stiffness due to the hinged knee brace on the L LE as well as the stiffness that she has in her joints from the cancer.  FUNCTIONAL TESTS:  5 times sit to stand: 23.43 sec from elevated mat table  Timed up and go (TUG): 23.33 sec 2 minute walk test: 211 10 meter walk test: 18.33 sec Berg Balance Scale: 43/56    PATIENT SURVEYS:  FOTO 46/59  TODAY'S TREATMENT: DATE: 06/03/22    Gait training Gait training through hall x 438ft+50ft through rehab gym and hall with supervision assist. Noted to have improved cardio respiratory endurance on this day and reduced trunkal sway R and L with limb advancement.   TherEx:  Sit<>stand from arm chair with small pillow in seat x 4 with BUE pushing from knees and x 6 with UE pushing from arm rests.  Step up to airex pad x 10 BLE with UE support on HDRW progressing to no UE support. No pain noted throughout step ups. Therapeutic rest break between bouts.  Seated  Upper trap stretch performed R and L x 5 with 5 sec hold to demonstrate HEP. PT educated pt on benefits of sustained stretch. For improved muscle extensibility. Additional UT stretch into lateral flexion 4 x 15-20sec Cervical rotation 4x 15 sec hold Cervical flexion/extension 3x 15 sec hold each.     PATIENT EDUCATION: Education details: Pt educated throughout session about proper posture and technique with exercises.  Improved exercise technique, movement at target joints, use of target muscles after min to mod verbal, visual, tactile cues. Person educated: Patient and Runner, broadcasting/film/video method: Explanation Education comprehension: verbalized understanding  HOME EXERCISE PROGRAM:  Access Code: [email or web address] URL: [email or web address] Date: 05/19/2022 Prepared by: Barrie Folk  Exercises - Seated Hip Abduction with Resistance  - 1 x daily - 7 x weekly - 3 sets - 10 reps - Standing Hip Extension Kicks  - 1 x daily - 7 x weekly - 3 sets - 10 reps - Standing Hip Abduction Kicks  - 1 x daily - 7 x weekly - 3 sets - 10 reps  Access Code: [email or web address] URL: [email or web address] Date: 05/14/2022 Prepared by: [place]  Exercises - Isometric Shoulder Flexion at Wall  - 1 x daily - 7 x weekly - 3 sets - 10 reps - Isometric Shoulder Extension at Wall  - 1 x daily - 7 x weekly - 3 sets - 10 reps - Isometric Shoulder Abduction at Wall  - 1 x daily - 7 x weekly - 3 sets - 10 reps - Isometric Shoulder Adduction  - 1 x daily - 7 x weekly - 3 sets - 10 reps   GOALS:  Goals reviewed with patient? Yes  SHORT TERM GOALS: Target date: 06/03/2022  Pt will be independent with HEP in order to demonstrate increased ability to perform tasks related to occupation/hobbies. Baseline: Not given HEP at initial evaluation. Goal status: INITIAL   LONG TERM GOALS: Target date: 07/29/2022  Patient will demonstrate improved function as evidenced by a score of 59 on FOTO measure for full participation in activities at home and in the community. Baseline: Evaluation: 46 Goal status: INITIAL  2.  Patient to demonstrate  increased cervical rotation ROM to be 65 deg in order to return to PLOF and improving safety while driving. Baseline: 16/26 deg Goal status: INITIAL  3.  Pt to improve B shoulder ROM to be WFL (Flexion: 120 deg; Abduction: 130 deg) in order to improve ability to complete  tasks Baseline: R/L Shoulder Flex: 62/99; R/L Shoulder Scaption: 60/56 Goal status: INITIAL  4.  Pt to improve overall strength of the LE's in order to ambulate for longer distances and be able to tolerate surgery to repair the L knee. Baseline: Global 4/5 strength in the R LE, L LE MMT deferred due to the patella being broken at this time. Goal status: INITIAL  5.  Patient (> 22 years old) will complete five times sit to stand test in < 15 seconds indicating an increased LE strength and improved balance. Baseline: 23.43 sec Goal status: INITIAL   6.  Patient will reduce timed up and go to <11 seconds to reduce fall risk and demonstrate improved transfer/gait ability. Baseline: 23.33 sec Goal status: INITIAL  7.  Pt will improve BERG by at least 3 points in order to demonstrate clinically significant improvement in balance.  Baseline: 43 Goal status: INITIAL    ASSESSMENT:  CLINICAL IMPRESSION:  Session limited by being late to scheduled treatment time. Put forth good effort throughout session for pain management and BLE strengthening. Education provided for HEP and therex to improve muscle extensibility and maximize strengthening benefits of interventions. Monitored throughout session for change in status and pain management. Pt will continue to benefit from skilled therapy to address remaining deficits in order to improve overall QoL and return to PLOF.       OBJECTIVE IMPAIRMENTS: Abnormal gait, decreased activity tolerance, decreased balance, decreased endurance, decreased knowledge of use of DME, decreased mobility, difficulty walking, decreased ROM, decreased strength, hypomobility, impaired flexibility, impaired UE functional use, and pain  ACTIVITY LIMITATIONS: carrying, lifting, bending, sitting, standing, squatting, stairs, transfers, bed mobility, bathing, toileting, dressing, reach over head, hygiene/grooming, and locomotion level  PARTICIPATION LIMITATIONS: meal prep,  cleaning, laundry, driving, shopping, community activity, occupation, and yard work  PERSONAL FACTORS: Time since onset of injury/illness/exacerbation and 3+ comorbidities: Hx of breast cancer, bone cancer, thyroid disease  are also affecting patient's functional outcome.   REHAB POTENTIAL: Fair due to multiple joints needing to be targeted and pt's current bone cancer that is causing her joints to stiffen and become more rigid.    CLINICAL DECISION MAKING: Evolving/moderate complexity  EVALUATION COMPLEXITY: Moderate  PLAN:  PT FREQUENCY: 2x/week  PT DURATION: 12 weeks  PLANNED INTERVENTIONS: Therapeutic exercises, Therapeutic activity, Neuromuscular re-education, Balance training, Gait training, Patient/Family education, Self Care, Joint mobilization, Stair training, Vestibular training, Canalith repositioning, DME instructions, Aquatic Therapy, Dry Needling, Spinal mobilization, Cryotherapy, Moist heat, Manual therapy, and Re-evaluation  PLAN FOR NEXT SESSION:    Allow pt identifies as most pressing issue (shoulder vs neck, vs LE) and treat with strengthening or stretching program to assist with ROM and pain modulation.   Barrie Folk PT, DPT  Physical Therapist - [facility]  06/03/22, 1:04 PM

## 2022-06-08 ENCOUNTER — Telehealth: Payer: Self-pay | Admitting: *Deleted

## 2022-06-08 ENCOUNTER — Ambulatory Visit: Payer: Commercial Managed Care - PPO | Admitting: Physical Therapy

## 2022-06-08 DIAGNOSIS — M6281 Muscle weakness (generalized): Secondary | ICD-10-CM | POA: Diagnosis not present

## 2022-06-08 DIAGNOSIS — C50512 Malignant neoplasm of lower-outer quadrant of left female breast: Secondary | ICD-10-CM

## 2022-06-08 NOTE — Telephone Encounter (Signed)
Received VM from pt.  RN attempt x1 to return call.  No answer, LVM.

## 2022-06-08 NOTE — Therapy (Signed)
OUTPATIENT PHYSICAL THERAPY NEURO TREATMENT   Patient Name: BRISEIS HEY MRN: [id number] DOB:October 13, 1960, 62 y.o., female Today's Date: 06/08/2022   PCP: Early Osmond, MD REFERRING PROVIDER: Nicholas Lose, MD  END OF SESSION:  PT End of Session - 06/08/22 1034     Visit Number 6    Number of Visits 24    Date for PT Re-Evaluation 07/29/22    Authorization Type [employer]    PT Start Time 1023    PT Stop Time 1107    PT Time Calculation (min) 44 min    Equipment Utilized During Treatment Gait belt    Activity Tolerance Patient tolerated treatment well    Behavior During Therapy WFL for tasks assessed/performed                 Past Medical History:  Diagnosis Date   Breast cancer ([place])    left breast cancer   Cancer ([place]) 09/2017   left breast cancer   Family history of breast cancer    Hypothyroidism    Personal history of radiation therapy 2019   Thyroid disease    Past Surgical History:  Procedure Laterality Date   BREAST BIOPSY Left 2018   BREAST BIOPSY Left 2019   BREAST RECONSTRUCTION WITH PLACEMENT OF TISSUE EXPANDER AND ALLODERM Left 10/26/2017   Procedure: LEFT BREAST RECONSTRUCTION WITH PLACEMENT OF TISSUE EXPANDER AND ALLODERM;  Surgeon: Irene Limbo, MD;  Location: [place];  Service: Plastics;  Laterality: Left;   MASTECTOMY Left 2019   MASTECTOMY WITH RADIOACTIVE SEED GUIDED EXCISION AND AXILLARY SENTINEL LYMPH NODE BIOPSY Left 10/26/2017   Procedure: LEFT MASTECTOMY WITH SEED TARGETED  LEFT AXILLARY LYMPH NODE EXCISION AND LEFT SENTINEL LYMPH NODE BIOPSY;  Surgeon: Stark Klein, MD;  Location: [place];  Service: General;  Laterality: Left;   TONSILLECTOMY     WISDOM TOOTH EXTRACTION     Patient Active Problem List   Diagnosis Date Noted   Metastatic malignant neoplasm ([place]) 12/19/2021   Family history of breast cancer    History of therapeutic radiation 04/27/2018   Breast cancer, left breast  ([place]) 10/26/2017   Malignant neoplasm of lower-outer quadrant of left breast of female, estrogen receptor positive ([place]) 07/15/2016   Plantar fasciitis 07/30/2015   Metatarsalgia 10/18/2014    ONSET DATE: September 2023  REFERRING DIAG: C79.51 (ICD-10-CM) - Malignant neoplasm metastatic to bone [facility])   THERAPY DIAG:  Malignant neoplasm of lower-outer quadrant of left breast of female, estrogen receptor positive ([place])  Rationale for Evaluation and Treatment: Rehabilitation  SUBJECTIVE:  SUBJECTIVE STATEMENT:  Pt reports that she is doing well since last visit.  Mild neck stiffness on this day and generalized pain in LLE and trunk. Reports feeling stronger and more confident with all mobility including being able to go out for lunch next week.   Next MD appointment 06/22/22 with oncology following scans on 06/18/22    Pt accompanied by:  caregiver, [name]  PERTINENT HISTORY:   Pt is 62 y.o. female that is seeking therapy for weakness of the R LE.  Pt tripped over the threshold of her door back in September of 2023, and had a transverse fracture of the L patella when she fell on it.  She was recently diagnosed with bone cancer back in October of 2023.  Pt notes that she has more stiffness in the neck and pelvis region because the cancer is located in those locations.  Pt is currently utilizing a hinged knee brace that she received from a friend for support of the L knee.  Secure Chat with referring provider Nicholas Lose, MD, and asked for clarification on any precautions were present due to the pt's cancer diagnosis  Pt is cleared to have modalities performed including dry needling and heat applied per MD.   PAIN:  Are you having pain? No  PRECAUTIONS: Other: Cancer  WEIGHT BEARING RESTRICTIONS: Pt denies  having any weightbearing restrictions on the knee  FALLS: Has patient fallen in last 6 months? Yes. Number of falls 1  LIVING ENVIRONMENT: Lives with: lives alone Lives in: House/apartment Stairs: No Has following equipment at home: Gilford Rile - 4 wheeled, shower chair, and elevated toilet with bars on the side.  Pt is looking at putting in grab bars in the shower, but has assistant that helps with showers.  PLOF: Independent  PATIENT GOALS: To get stronger, be able to walk better.    OBJECTIVE:   DIAGNOSTIC FINDINGS:   CLINICAL DATA:  Left knee pain   EXAM: LEFT KNEE - 1-2 VIEW  COMPARISON:  October 2022  FINDINGS: Nonunion of left patellar fracture. No new acute fracture. Degenerative changes of the knee demonstrated by subchondral lucencies of the distal femur. Visualized soft tissues are unremarkable.  IMPRESSION: Nonunion of left patellar fracture.   COGNITION: Overall cognitive status: Within functional limits for tasks assessed   SENSATION: WFL   POSTURE: No Significant postural limitations.  Pt is however unable to rotate her neck or move her hips as much due to the cancer in the pt.    CERVICAL ROM:   Active ROM A/PROM (deg) eval  Flexion 36  Extension 16  Right lateral flexion 11  Left lateral flexion 10  Right rotation 16  Left rotation 26   (Blank rows = not tested)   LOWER EXTREMITY ROM:     Active  Right Eval Left Eval  Knee flexion 85 deg deferred  Knee extension 17 deg lacking  deferred    LOWER EXTREMITY MMT:    MMT Right Eval Left Eval  Hip flexion 4 limited by pain 4+  Knee flexion 4 deferred  Knee extension 4 deferred    UPPER EXTREMITY ROM:  Active ROM Right eval Left eval  Shoulder flexion 62 deg 99 deg  Shoulder scaption 60 deg 56 deg    UPPER EXTREMITY MMT:  MMT Right eval Left eval  Shoulder flexion 3+ with pain 4  Shoulder abduction 3+ with pain 4  Grip strength 28# 38#     BED MOBILITY:  Supine to  sit Modified  independence, extra time necessary.   TRANSFERS: Assistive device utilized: None  Sit to stand: Modified independence Stand to sit: Modified independence Chair to chair: Modified independence     GAIT: Gait pattern: step through pattern, decreased step length- Right, decreased stance time- Left, and decreased stride length Distance walked: 40' Assistive device utilized: None Level of assistance: Modified independence Comments: Pt ambulates with significant stiffness due to the hinged knee brace on the L LE as well as the stiffness that she has in her joints from the cancer.  FUNCTIONAL TESTS:  5 times sit to stand: 23.43 sec from elevated mat table  Timed up and go (TUG): 23.33 sec 2 minute walk test: 211 10 meter walk test: 18.33 sec Berg Balance Scale: 43/56    PATIENT SURVEYS:  FOTO 46/59  TODAY'S TREATMENT: DATE: 06/08/22    Gait training  Gait training through hall x 457ft+30ft x2 through rehab gym and hall with supervision assist. Noted to have improved cardio respiratory endurance on this day and reduced trunkal sway R and L with limb advancement.   TherEx:  Scifit UE 3 min forward/3 min reverse level 3-4 with instruction to keep RPM <40. Noted to have mild compensation on the RUE with scapular retraction over shoulder extension. Cervical rotation with PT stabilize and provide over pressure to the contralateral shoulder 2 x 30 sec bil. Performed chin tuck with 10 sec hold x 4. Cues to decreased thoracic extension.  Sit<>stand x 8 throughout session from various seat heights with supervision assist and UE to push from chair.  Manual therapy PT performed STM with trigger point release to Bil suboccipitals, splenius capitus, and UT x 8 minutes. UT/levor stretch 2 x 30 sec with overpressure from PT to shoulder to prevent compensation.     PATIENT EDUCATION: Education details: Pt educated throughout session about proper posture and technique with  exercises. Improved exercise technique, movement at target joints, use of target muscles after min to mod verbal, visual, tactile cues. Person educated: Patient and Runner, broadcasting/film/video method: Explanation Education comprehension: verbalized understanding  HOME EXERCISE PROGRAM:  Access Code: [email or web address] URL: [email or web address] Date: 05/19/2022 Prepared by: Barrie Folk  Exercises - Seated Hip Abduction with Resistance  - 1 x daily - 7 x weekly - 3 sets - 10 reps - Standing Hip Extension Kicks  - 1 x daily - 7 x weekly - 3 sets - 10 reps - Standing Hip Abduction Kicks  - 1 x daily - 7 x weekly - 3 sets - 10 reps  Access Code: [email or web address] URL: [email or web address] Date: 05/14/2022 Prepared by: [employer]  Exercises - Isometric Shoulder Flexion at Wall  - 1 x daily - 7 x weekly - 3 sets - 10 reps - Isometric Shoulder Extension at Wall  - 1 x daily - 7 x weekly - 3 sets - 10 reps - Isometric Shoulder Abduction at Wall  - 1 x daily - 7 x weekly - 3 sets - 10 reps - Isometric Shoulder Adduction  - 1 x daily - 7 x weekly - 3 sets - 10 reps   GOALS:  Goals reviewed with patient? Yes  SHORT TERM GOALS: Target date: 06/03/2022  Pt will be independent with HEP in order to demonstrate increased ability to perform tasks related to occupation/hobbies. Baseline: Not given HEP at initial evaluation. Goal status: INITIAL   LONG TERM GOALS: Target date: 07/29/2022  Patient will demonstrate improved function as evidenced by a score of 59 on FOTO measure for full  participation in activities at home and in the community. Baseline: Evaluation: 46 Goal status: INITIAL  2.  Patient to demonstrate increased cervical rotation ROM to be 65 deg in order to return to PLOF and improving safety while driving. Baseline: 16/26 deg Goal status: INITIAL  3.  Pt to improve B shoulder ROM to be WFL (Flexion: 120 deg; Abduction: 130 deg) in order to improve ability to  complete tasks Baseline: R/L Shoulder Flex: 62/99; R/L Shoulder Scaption: 60/56 Goal status: INITIAL  4.  Pt to improve overall strength of the LE's in order to ambulate for longer distances and be able to tolerate surgery to repair the L knee. Baseline: Global 4/5 strength in the R LE, L LE MMT deferred due to the patella being broken at this time. Goal status: INITIAL  5.  Patient (> 18 years old) will complete five times sit to stand test in < 15 seconds indicating an increased LE strength and improved balance. Baseline: 23.43 sec Goal status: INITIAL   6.  Patient will reduce timed up and go to <11 seconds to reduce fall risk and demonstrate improved transfer/gait ability. Baseline: 23.33 sec Goal status: INITIAL  7.  Pt will improve BERG by at least 3 points in order to demonstrate clinically significant improvement in balance.  Baseline: 43 Goal status: INITIAL    ASSESSMENT:  CLINICAL IMPRESSION:  Pt continues to demonstrate improved endurance, balance and BLE strength. Pt reports greatest limiting factors for improved independence with iADLs are decreased cervical ROM and R shoulder ROM and strength deficits. PT Treatment focused on addressing cervical ROM and muscle tension. Reports improved ROM in cervical rotation following intervention on this day as well as improved strength in RUE.  Pt will continue to benefit from skilled therapy to address remaining deficits in order to improve overall QoL and return to PLOF.       OBJECTIVE IMPAIRMENTS: Abnormal gait, decreased activity tolerance, decreased balance, decreased endurance, decreased knowledge of use of DME, decreased mobility, difficulty walking, decreased ROM, decreased strength, hypomobility, impaired flexibility, impaired UE functional use, and pain  ACTIVITY LIMITATIONS: carrying, lifting, bending, sitting, standing, squatting, stairs, transfers, bed mobility, bathing, toileting, dressing, reach over head,  hygiene/grooming, and locomotion level  PARTICIPATION LIMITATIONS: meal prep, cleaning, laundry, driving, shopping, community activity, occupation, and yard work  PERSONAL FACTORS: Time since onset of injury/illness/exacerbation and 3+ comorbidities: Hx of breast cancer, bone cancer, thyroid disease  are also affecting patient's functional outcome.   REHAB POTENTIAL: Fair due to multiple joints needing to be targeted and pt's current bone cancer that is causing her joints to stiffen and become more rigid.    CLINICAL DECISION MAKING: Evolving/moderate complexity  EVALUATION COMPLEXITY: Moderate  PLAN:  PT FREQUENCY: 2x/week  PT DURATION: 12 weeks  PLANNED INTERVENTIONS: Therapeutic exercises, Therapeutic activity, Neuromuscular re-education, Balance training, Gait training, Patient/Family education, Self Care, Joint mobilization, Stair training, Vestibular training, Canalith repositioning, DME instructions, Aquatic Therapy, Dry Needling, Spinal mobilization, Cryotherapy, Moist heat, Manual therapy, and Re-evaluation  PLAN FOR NEXT SESSION:    Allow pt identifies as most pressing issue (shoulder vs neck, vs LE) and treat with strengthening or stretching program to assist with ROM and pain modulation.    Barrie Folk PT, DPT  Physical Therapist - [facility]  06/08/22, 12:21 PM

## 2022-06-10 ENCOUNTER — Ambulatory Visit: Payer: Commercial Managed Care - PPO | Admitting: Physical Therapy

## 2022-06-11 ENCOUNTER — Ambulatory Visit: Payer: Commercial Managed Care - PPO

## 2022-06-11 ENCOUNTER — Telehealth: Payer: Self-pay | Admitting: *Deleted

## 2022-06-11 DIAGNOSIS — R262 Difficulty in walking, not elsewhere classified: Secondary | ICD-10-CM

## 2022-06-11 DIAGNOSIS — M6281 Muscle weakness (generalized): Secondary | ICD-10-CM | POA: Diagnosis not present

## 2022-06-11 DIAGNOSIS — M25562 Pain in left knee: Secondary | ICD-10-CM

## 2022-06-11 NOTE — Telephone Encounter (Signed)
Received call from pt with complaint of HR 111 during physical therapy.  Pt called to alert our office.  Pt denies shortness of breath or chest pain.  RN encouraged pt to monitor HR and alert our office if it goes any higher or pt becomes symptomatic with chest pain and shortness of breath. Pt verbalized understanding.

## 2022-06-11 NOTE — Therapy (Signed)
OUTPATIENT PHYSICAL THERAPY NEURO TREATMENT   Patient Name: Kathryn Lucas MRN: [id number] DOB:1960/10/26, 62 y.o., female Today's Date: 06/11/2022   PCP: Early Osmond, MD REFERRING PROVIDER: Nicholas Lose, MD  END OF SESSION:  PT End of Session - 06/11/22 1614     Visit Number 7    Number of Visits 24    Date for PT Re-Evaluation 07/29/22    Authorization Type [employer]    PT Start Time 1303    PT Stop Time 1351    PT Time Calculation (min) 48 min    Equipment Utilized During Treatment Gait belt    Activity Tolerance Patient tolerated treatment well;Patient limited by pain    Behavior During Therapy [facility] for tasks assessed/performed                 Past Medical History:  Diagnosis Date   Breast cancer ([place])    left breast cancer   Cancer ([place]) 09/2017   left breast cancer   Family history of breast cancer    Hypothyroidism    Personal history of radiation therapy 2019   Thyroid disease    Past Surgical History:  Procedure Laterality Date   BREAST BIOPSY Left 2018   BREAST BIOPSY Left 2019   BREAST RECONSTRUCTION WITH PLACEMENT OF TISSUE EXPANDER AND ALLODERM Left 10/26/2017   Procedure: LEFT BREAST RECONSTRUCTION WITH PLACEMENT OF TISSUE EXPANDER AND ALLODERM;  Surgeon: Irene Limbo, MD;  Location: [place];  Service: Plastics;  Laterality: Left;   MASTECTOMY Left 2019   MASTECTOMY WITH RADIOACTIVE SEED GUIDED EXCISION AND AXILLARY SENTINEL LYMPH NODE BIOPSY Left 10/26/2017   Procedure: LEFT MASTECTOMY WITH SEED TARGETED  LEFT AXILLARY LYMPH NODE EXCISION AND LEFT SENTINEL LYMPH NODE BIOPSY;  Surgeon: Stark Klein, MD;  Location: [place];  Service: General;  Laterality: Left;   TONSILLECTOMY     WISDOM TOOTH EXTRACTION     Patient Active Problem List   Diagnosis Date Noted   Metastatic malignant neoplasm ([place]) 12/19/2021   Family history of breast cancer    History of therapeutic radiation 04/27/2018    Breast cancer, left breast ([place]) 10/26/2017   Malignant neoplasm of lower-outer quadrant of left breast of female, estrogen receptor positive ([place]) 07/15/2016   Plantar fasciitis 07/30/2015   Metatarsalgia 10/18/2014    ONSET DATE: September 2023  REFERRING DIAG: C79.51 (ICD-10-CM) - Malignant neoplasm metastatic to bone (Kathryn Lucas)   THERAPY DIAG:  Acute pain of left knee  Muscle weakness (generalized)  Difficulty in walking, not elsewhere classified  Rationale for Evaluation and Treatment: Rehabilitation  SUBJECTIVE:  SUBJECTIVE STATEMENT:  Pt presents to PT feeling well. Reports no updates or current concerns, no falls/stumbles.   Pt accompanied by:  self  PERTINENT HISTORY:   Pt is 62 y.o. female that is seeking therapy for weakness of the R LE.  Pt tripped over the threshold of her door back in September of 2023, and had a transverse fracture of the L patella when she fell on it.  She was recently diagnosed with bone cancer back in October of 2023.  Pt notes that she has more stiffness in the neck and pelvis region because the cancer is located in those locations.  Pt is currently utilizing a hinged knee brace that she received from a friend for support of the L knee.  Secure Chat with referring provider Nicholas Lose, MD, and asked for clarification on any precautions were present due to the pt's cancer diagnosis  Pt is cleared to have modalities performed including dry needling and heat applied per MD.   PAIN:  Are you having pain? No  PRECAUTIONS: Other: Cancer  WEIGHT BEARING RESTRICTIONS: Pt denies having any weightbearing restrictions on the knee  FALLS: Has patient fallen in last 6 months? Yes. Number of falls 1  LIVING ENVIRONMENT: Lives with: lives alone Lives in:  House/apartment Stairs: No Has following equipment at home: Gilford Rile - 4 wheeled, shower chair, and elevated toilet with bars on the side.  Pt is looking at putting in grab bars in the shower, but has assistant that helps with showers.  PLOF: Independent  PATIENT GOALS: To get stronger, be able to walk better.    OBJECTIVE:   DIAGNOSTIC FINDINGS:   CLINICAL DATA:  Left knee pain   EXAM: LEFT KNEE - 1-2 VIEW  COMPARISON:  October 2022  FINDINGS: Nonunion of left patellar fracture. No new acute fracture. Degenerative changes of the knee demonstrated by subchondral lucencies of the distal femur. Visualized soft tissues are unremarkable.  IMPRESSION: Nonunion of left patellar fracture.   COGNITION: Overall cognitive status: Within functional limits for tasks assessed   SENSATION: WFL   POSTURE: No Significant postural limitations.  Pt is however unable to rotate her neck or move her hips as much due to the cancer in the pt.    CERVICAL ROM:   Active ROM A/PROM (deg) eval  Flexion 36  Extension 16  Right lateral flexion 11  Left lateral flexion 10  Right rotation 16  Left rotation 26   (Blank rows = not tested)   LOWER EXTREMITY ROM:     Active  Right Eval Left Eval  Knee flexion 85 deg deferred  Knee extension 17 deg lacking  deferred    LOWER EXTREMITY MMT:    MMT Right Eval Left Eval  Hip flexion 4 limited by pain 4+  Knee flexion 4 deferred  Knee extension 4 deferred    UPPER EXTREMITY ROM:  Active ROM Right eval Left eval  Shoulder flexion 62 deg 99 deg  Shoulder scaption 60 deg 56 deg    UPPER EXTREMITY MMT:  MMT Right eval Left eval  Shoulder flexion 3+ with pain 4  Shoulder abduction 3+ with pain 4  Grip strength 28# 38#     BED MOBILITY:  Supine to sit Modified independence, extra time necessary.   TRANSFERS: Assistive device utilized: None  Sit to stand: Modified independence Stand to sit: Modified independence Chair  to chair: Modified independence     GAIT: Gait pattern: step through pattern, decreased step length- Right, decreased stance  time- Left, and decreased stride length Distance walked: 40' Assistive device utilized: None Level of assistance: Modified independence Comments: Pt ambulates with significant stiffness due to the hinged knee brace on the L LE as well as the stiffness that she has in her joints from the cancer.  FUNCTIONAL TESTS:  5 times sit to stand: 23.43 sec from elevated mat table  Timed up and go (TUG): 23.33 sec 2 minute walk test: 211 10 meter walk test: 18.33 sec Berg Balance Scale: 43/56    PATIENT SURVEYS:  FOTO 46/59  TODAY'S TREATMENT: DATE: 06/11/22   TE: Nustep lvl 2-3 (rates easy), however, terminated at 2 min 20 sec when pt reported feeling L knee pain/discomfort. This improved with rest.   Attempted STS from elevated seat height 2x, terminated due to L knee pain/discomfort. L knee pain continues to improve with rest.  Gait training through hall 2 x 444 through rehab gym and hall with supervision assist. HR 113-123 bpm immediately following intervention.  HR assessed after 1 min of rest found to be 110-115 range. Reassessed after 2 more minutes in sitting and still within this range. Transitioned to manual therapy and gentle cervical therex.  Cervical rotation (gentle, pain-free) 2x10 each side. Pain-free  Cervical lateral flexion 10x each side. Pain-free   Performed chin tuck seated, with 10x with 3 sec hold/rep. Cues for technique    Prolonged rest break to allow for HR decrease  Pt reports she has no symptoms currently, reports no symptoms at home. She reports she has found her HR has been this high at rest in the past but is not tracking it. PT instructs pt to contact her physician regarding high resting HR and encourages pt to continue to monitor at home. Pt verbalizes understanding and reports she will call her physician following session.    Manual therapy Pt seated. PT provides STM and TrP release to bilateral upper trap, bil suboccipitals and cervical paraspinals. Pt reports manual therapy feels good.   Reassessed HR, continues to remain around 110-112 bpm even with several minutes of seated, non-exertional activity (manual therapy)    PATIENT EDUCATION: Education details: Pt educated throughout session about proper posture and technique with exercises. Improved exercise technique, movement at target joints, use of target muscles after min to mod verbal, visual, tactile cues. Instruction in contacting physician regarding resting HR, encourages pt to track at home, watch for other signs/symptoms and when to seek care Person educated: Patient and Caregiver [name] Education method: Explanation Education comprehension: verbalized understanding  HOME EXERCISE PROGRAM:  Access Code: [email or web address] URL: [email or web address] Date: 05/19/2022 Prepared by: Barrie Folk  Exercises - Seated Hip Abduction with Resistance  - 1 x daily - 7 x weekly - 3 sets - 10 reps - Standing Hip Extension Kicks  - 1 x daily - 7 x weekly - 3 sets - 10 reps - Standing Hip Abduction Kicks  - 1 x daily - 7 x weekly - 3 sets - 10 reps  Access Code: [email or web address] URL: [email or web address] Date: 05/14/2022 Prepared by: [place]  Exercises - Isometric Shoulder Flexion at Wall  - 1 x daily - 7 x weekly - 3 sets - 10 reps - Isometric Shoulder Extension at Wall  - 1 x daily - 7 x weekly - 3 sets - 10 reps - Isometric Shoulder Abduction at Wall  - 1 x daily - 7 x weekly - 3 sets - 10 reps - Isometric Shoulder Adduction  - 1 x daily - 7 x  weekly - 3 sets - 10 reps   GOALS:  Goals reviewed with patient? Yes  SHORT TERM GOALS: Target date: 06/03/2022  Pt will be independent with HEP in order to demonstrate increased ability to perform tasks related to occupation/hobbies. Baseline: Not given HEP at initial evaluation. Goal  status: INITIAL   LONG TERM GOALS: Target date: 07/29/2022  Patient will demonstrate improved function as evidenced by a score of 59 on FOTO measure for full participation in activities at home and in the community. Baseline: Evaluation: 46 Goal status: INITIAL  2.  Patient to demonstrate increased cervical rotation ROM to be 65 deg in order to return to PLOF and improving safety while driving. Baseline: 16/26 deg Goal status: INITIAL  3.  Pt to improve B shoulder ROM to be WFL (Flexion: 120 deg; Abduction: 130 deg) in order to improve ability to complete tasks Baseline: R/L Shoulder Flex: 62/99; R/L Shoulder Scaption: 60/56 Goal status: INITIAL  4.  Pt to improve overall strength of the LE's in order to ambulate for longer distances and be able to tolerate surgery to repair the L knee. Baseline: Global 4/5 strength in the R LE, L LE MMT deferred due to the patella being broken at this time. Goal status: INITIAL  5.  Patient (> 97 years old) will complete five times sit to stand test in < 15 seconds indicating an increased LE strength and improved balance. Baseline: 23.43 sec Goal status: INITIAL   6.  Patient will reduce timed up and go to <11 seconds to reduce fall risk and demonstrate improved transfer/gait ability. Baseline: 23.33 sec Goal status: INITIAL  7.  Pt will improve BERG by at least 3 points in order to demonstrate clinically significant improvement in balance.  Baseline: 43 Goal status: INITIAL    ASSESSMENT:  CLINICAL IMPRESSION: TherEx somewhat limited today secondary to L knee pain and pt HR remaining above 100 bpm even with rest and non-exertional activity. HR was not taken prior to interventions so unclear if pt HR with delayed recovery following activity or if it is high at rest. Instructed pt to monitor at home and to reach out to her physician as well regarding this. Pt verbalized understanding and agreeable to contacting her physician. The pt will continue  to benefit from skilled therapy to address remaining deficits in order to improve overall QoL and return to PLOF.       OBJECTIVE IMPAIRMENTS: Abnormal gait, decreased activity tolerance, decreased balance, decreased endurance, decreased knowledge of use of DME, decreased mobility, difficulty walking, decreased ROM, decreased strength, hypomobility, impaired flexibility, impaired UE functional use, and pain  ACTIVITY LIMITATIONS: carrying, lifting, bending, sitting, standing, squatting, stairs, transfers, bed mobility, bathing, toileting, dressing, reach over head, hygiene/grooming, and locomotion level  PARTICIPATION LIMITATIONS: meal prep, cleaning, laundry, driving, shopping, community activity, occupation, and yard work  PERSONAL FACTORS: Time since onset of injury/illness/exacerbation and 3+ comorbidities: Hx of breast cancer, bone cancer, thyroid disease  are also affecting patient's functional outcome.   REHAB POTENTIAL: Fair due to multiple joints needing to be targeted and pt's current bone cancer that is causing her joints to stiffen and become more rigid.    CLINICAL DECISION MAKING: Evolving/moderate complexity  EVALUATION COMPLEXITY: Moderate  PLAN:  PT FREQUENCY: 2x/week  PT DURATION: 12 weeks  PLANNED INTERVENTIONS: Therapeutic exercises, Therapeutic activity, Neuromuscular re-education, Balance training, Gait training, Patient/Family education, Self Care, Joint mobilization, Stair training, Vestibular training, Canalith repositioning, DME instructions, Aquatic Therapy, Dry Needling, Spinal mobilization, Cryotherapy, Moist  heat, Manual therapy, and Re-evaluation  PLAN FOR NEXT SESSION:    Allow pt identifies as most pressing issue (shoulder vs neck, vs LE) and treat with strengthening or stretching program to assist with ROM and pain modulation.    Ricard Dillon PT, DPT  Physical Therapist - [facility]  06/11/22, 4:26  PM

## 2022-06-15 ENCOUNTER — Ambulatory Visit: Payer: Commercial Managed Care - PPO | Attending: Hematology and Oncology | Admitting: Physical Therapy

## 2022-06-15 ENCOUNTER — Encounter: Payer: Self-pay | Admitting: Hematology and Oncology

## 2022-06-15 ENCOUNTER — Inpatient Hospital Stay: Payer: Commercial Managed Care - PPO

## 2022-06-15 DIAGNOSIS — M542 Cervicalgia: Secondary | ICD-10-CM | POA: Diagnosis present

## 2022-06-15 DIAGNOSIS — M6281 Muscle weakness (generalized): Secondary | ICD-10-CM | POA: Diagnosis present

## 2022-06-15 DIAGNOSIS — R29898 Other symptoms and signs involving the musculoskeletal system: Secondary | ICD-10-CM | POA: Diagnosis present

## 2022-06-15 DIAGNOSIS — M25562 Pain in left knee: Secondary | ICD-10-CM | POA: Diagnosis present

## 2022-06-15 DIAGNOSIS — R262 Difficulty in walking, not elsewhere classified: Secondary | ICD-10-CM | POA: Diagnosis present

## 2022-06-15 DIAGNOSIS — M436 Torticollis: Secondary | ICD-10-CM | POA: Insufficient documentation

## 2022-06-15 DIAGNOSIS — M25612 Stiffness of left shoulder, not elsewhere classified: Secondary | ICD-10-CM | POA: Diagnosis present

## 2022-06-15 DIAGNOSIS — M25611 Stiffness of right shoulder, not elsewhere classified: Secondary | ICD-10-CM | POA: Diagnosis present

## 2022-06-15 NOTE — Therapy (Signed)
OUTPATIENT PHYSICAL THERAPY NEURO TREATMENT   Patient Name: VENETTA PERKETT MRN: [id number] DOB:[birth year], 62 y.o., female Today's Date: 06/15/2022   PCP: Early Osmond, MD REFERRING PROVIDER: Nicholas Lose, MD  END OF SESSION:  PT End of Session - 06/15/22 1031     Visit Number 8    Number of Visits 24    Date for PT Re-Evaluation 07/29/22    Authorization Type [employer]    PT Start Time 1016    PT Stop Time 1100    PT Time Calculation (min) 44 min    Equipment Utilized During Treatment Gait belt    Activity Tolerance Patient tolerated treatment well;Patient limited by pain    Behavior During Therapy WFL for tasks assessed/performed                 Past Medical History:  Diagnosis Date   Breast cancer ([place])    left breast cancer   Cancer ([place]) 09/2017   left breast cancer   Family history of breast cancer    Hypothyroidism    Personal history of radiation therapy 2019   Thyroid disease    Past Surgical History:  Procedure Laterality Date   BREAST BIOPSY Left 2018   BREAST BIOPSY Left 2019   BREAST RECONSTRUCTION WITH PLACEMENT OF TISSUE EXPANDER AND ALLODERM Left 10/26/2017   Procedure: LEFT BREAST RECONSTRUCTION WITH PLACEMENT OF TISSUE EXPANDER AND ALLODERM;  Surgeon: Irene Limbo, MD;  Location: [facility];  Service: Plastics;  Laterality: Left;   MASTECTOMY Left 2019   MASTECTOMY WITH RADIOACTIVE SEED GUIDED EXCISION AND AXILLARY SENTINEL LYMPH NODE BIOPSY Left 10/26/2017   Procedure: LEFT MASTECTOMY WITH SEED TARGETED  LEFT AXILLARY LYMPH NODE EXCISION AND LEFT SENTINEL LYMPH NODE BIOPSY;  Surgeon: Stark Klein, MD;  Location: [facility];  Service: General;  Laterality: Left;   TONSILLECTOMY     WISDOM TOOTH EXTRACTION     Patient Active Problem List   Diagnosis Date Noted   Metastatic malignant neoplasm 12/19/2021   Family history of breast cancer    History of therapeutic radiation 04/27/2018   Breast  cancer, left breast 10/26/2017   Malignant neoplasm of lower-outer quadrant of left breast of female, estrogen receptor positive 07/15/2016   Plantar fasciitis 07/30/2015   Metatarsalgia 10/18/2014    ONSET DATE: September 2023  REFERRING DIAG: C79.51 (ICD-10-CM) - Malignant neoplasm metastatic to bone ([place])   THERAPY DIAG:  Acute pain of left knee  Muscle weakness (generalized)  Difficulty in walking, not elsewhere classified  Leg weakness, bilateral  Decreased range of motion of left shoulder  Decreased range of motion of right shoulder  Stiffness of cervical spine  Rationale for Evaluation and Treatment: Rehabilitation  SUBJECTIVE:  SUBJECTIVE STATEMENT:  Reports that she had and good holiday weekend. Reports feeling mildly fatigued from the day, nut no pain to report.   Next MD appointment 06/22/22 with oncology following scans on 06/18/22    Pt accompanied by:  caregiver, [name]  PERTINENT HISTORY:   Pt is 62 y.o. female that is seeking therapy for weakness of the R LE.  Pt tripped over the threshold of her door back in September of 2023, and had a transverse fracture of the L patella when she fell on it.  She was recently diagnosed with bone cancer back in October of 2023.  Pt notes that she has more stiffness in the neck and pelvis region because the cancer is located in those locations.  Pt is currently utilizing a hinged knee brace that she received from a friend for support of the L knee.  Secure Chat with referring provider Nicholas Lose, MD, and asked for clarification on any precautions were present due to the pt's cancer diagnosis  Pt is cleared to have modalities performed including dry needling and heat applied per MD.   PAIN:  Are you having pain? No  PRECAUTIONS: Other:  Cancer  WEIGHT BEARING RESTRICTIONS: Pt denies having any weightbearing restrictions on the knee  FALLS: Has patient fallen in last 6 months? Yes. Number of falls 1  LIVING ENVIRONMENT: Lives with: lives alone Lives in: House/apartment Stairs: No Has following equipment at home: Gilford Rile - 4 wheeled, shower chair, and elevated toilet with bars on the side.  Pt is looking at putting in grab bars in the shower, but has assistant that helps with showers.  PLOF: Independent  PATIENT GOALS: To get stronger, be able to walk better.    OBJECTIVE:   DIAGNOSTIC FINDINGS:   CLINICAL DATA:  Left knee pain   EXAM: LEFT KNEE - 1-2 VIEW  COMPARISON:  October 2022  FINDINGS: Nonunion of left patellar fracture. No new acute fracture. Degenerative changes of the knee demonstrated by subchondral lucencies of the distal femur. Visualized soft tissues are unremarkable.  IMPRESSION: Nonunion of left patellar fracture.   COGNITION: Overall cognitive status: Within functional limits for tasks assessed   SENSATION: WFL   POSTURE: No Significant postural limitations.  Pt is however unable to rotate her neck or move her hips as much due to the cancer in the pt.    CERVICAL ROM:   Active ROM A/PROM (deg) eval  Flexion 36  Extension 16  Right lateral flexion 11  Left lateral flexion 10  Right rotation 16  Left rotation 26   (Blank rows = not tested)   LOWER EXTREMITY ROM:     Active  Right Eval Left Eval  Knee flexion 85 deg deferred  Knee extension 17 deg lacking  deferred    LOWER EXTREMITY MMT:    MMT Right Eval Left Eval  Hip flexion 4 limited by pain 4+  Knee flexion 4 deferred  Knee extension 4 deferred    UPPER EXTREMITY ROM:  Active ROM Right eval Left eval  Shoulder flexion 62 deg 99 deg  Shoulder scaption 60 deg 56 deg    UPPER EXTREMITY MMT:  MMT Right eval Left eval  Shoulder flexion 3+ with pain 4  Shoulder abduction 3+ with pain 4  Grip  strength 28# 38#     BED MOBILITY:  Supine to sit Modified independence, extra time necessary.   TRANSFERS: Assistive device utilized: None  Sit to stand: Modified independence Stand to sit: Modified independence Chair  to chair: Modified independence     GAIT: Gait pattern: step through pattern, decreased step length- Right, decreased stance time- Left, and decreased stride length Distance walked: 40' Assistive device utilized: None Level of assistance: Modified independence Comments: Pt ambulates with significant stiffness due to the hinged knee brace on the L LE as well as the stiffness that she has in her joints from the cancer.  FUNCTIONAL TESTS:  5 times sit to stand: 23.43 sec from elevated mat table  Timed up and go (TUG): 23.33 sec 2 minute walk test: 211 10 meter walk test: 18.33 sec Berg Balance Scale: 43/56    PATIENT SURVEYS:  FOTO 46/59  TODAY'S TREATMENT: DATE: 06/15/22    Gait training  Gait training through hall x 447ft+30ft x2 through rehab gym and hall with supervision assist. Noted to have improved cardio respiratory endurance on this day and reduced trunkal sway R and L with limb advancement.   TherEx:  Scifit UE 3 min forward/3 min reverse level 3-4 with instruction to keep RPM <40. Noted to have mild compensation on the RUE with scapular retraction over shoulder extension.  Sit<>stand from 23 inches on mat table with Push from bil thighs. 2 x 10. Mild use of momentum into standing cues for controlled descent.   Side stepping in slight squat. 20ft x 4 bil in parallel bars.  Standing Calf raise, toe raise x 20 bil  Standing Reciprocal march x 20 bil  LAQ 2 x 8     PATIENT EDUCATION: Education details: Pt educated throughout session about proper posture and technique with exercises. Improved exercise technique, movement at target joints, use of target muscles after min to mod verbal, visual, tactile cues. Person educated: Patient and  Runner, broadcasting/film/video method: Explanation Education comprehension: verbalized understanding  HOME EXERCISE PROGRAM:  Access Code: [email or web address] URL: [email or web address] Date: 05/19/2022 Prepared by: Barrie Folk  Exercises - Seated Hip Abduction with Resistance  - 1 x daily - 7 x weekly - 3 sets - 10 reps - Standing Hip Extension Kicks  - 1 x daily - 7 x weekly - 3 sets - 10 reps - Standing Hip Abduction Kicks  - 1 x daily - 7 x weekly - 3 sets - 10 reps  Access Code: [email or web address] URL: [email or web address] Date: 05/14/2022 Prepared by: Paterson Nation  Exercises - Isometric Shoulder Flexion at Wall  - 1 x daily - 7 x weekly - 3 sets - 10 reps - Isometric Shoulder Extension at Wall  - 1 x daily - 7 x weekly - 3 sets - 10 reps - Isometric Shoulder Abduction at Wall  - 1 x daily - 7 x weekly - 3 sets - 10 reps - Isometric Shoulder Adduction  - 1 x daily - 7 x weekly - 3 sets - 10 reps   GOALS:  Goals reviewed with patient? Yes  SHORT TERM GOALS: Target date: 06/03/2022  Pt will be independent with HEP in order to demonstrate increased ability to perform tasks related to occupation/hobbies. Baseline: Not given HEP at initial evaluation. Goal status: INITIAL   LONG TERM GOALS: Target date: 07/29/2022  Patient will demonstrate improved function as evidenced by a score of 59 on FOTO measure for full participation in activities at home and in the community. Baseline: Evaluation: 46 Goal status: INITIAL  2.  Patient to demonstrate increased cervical rotation ROM to be 65 deg in order to return to PLOF and improving safety while driving. Baseline: 16/26 deg Goal status: INITIAL  3.  Pt to improve B shoulder ROM to be WFL (Flexion: 120 deg; Abduction: 130 deg) in order to improve ability to complete tasks Baseline: R/L Shoulder Flex: 62/99; R/L Shoulder Scaption: 60/56 Goal status: INITIAL  4.  Pt to improve overall strength of the LE's in order to  ambulate for longer distances and be able to tolerate surgery to repair the L knee. Baseline: Global 4/5 strength in the R LE, L LE MMT deferred due to the patella being broken at this time. Goal status: INITIAL  5.  Patient (> 89 years old) will complete five times sit to stand test in < 15 seconds indicating an increased LE strength and improved balance. Baseline: 23.43 sec Goal status: INITIAL   6.  Patient will reduce timed up and go to <11 seconds to reduce fall risk and demonstrate improved transfer/gait ability. Baseline: 23.33 sec Goal status: INITIAL  7.  Pt will improve BERG by at least 3 points in order to demonstrate clinically significant improvement in balance.  Baseline: 43 Goal status: INITIAL    ASSESSMENT:  CLINICAL IMPRESSION:  Pt continues to put forth excellent effort towards improved strength and balance throughout PT treatment. Reduced neck pain on this day allows increased focus on BLE and BUE strength and conditioning. Pt reports feeling stronger and more functional throughout ADLs  as well as more confident with getting up from low surfaces. Pt will continue to benefit from skilled therapy to address remaining deficits in order to improve overall QoL and return to PLOF.       OBJECTIVE IMPAIRMENTS: Abnormal gait, decreased activity tolerance, decreased balance, decreased endurance, decreased knowledge of use of DME, decreased mobility, difficulty walking, decreased ROM, decreased strength, hypomobility, impaired flexibility, impaired UE functional use, and pain  ACTIVITY LIMITATIONS: carrying, lifting, bending, sitting, standing, squatting, stairs, transfers, bed mobility, bathing, toileting, dressing, reach over head, hygiene/grooming, and locomotion level  PARTICIPATION LIMITATIONS: meal prep, cleaning, laundry, driving, shopping, community activity, occupation, and yard work  PERSONAL FACTORS: Time since onset of injury/illness/exacerbation and 3+  comorbidities: Hx of breast cancer, bone cancer, thyroid disease  are also affecting patient's functional outcome.   REHAB POTENTIAL: Fair due to multiple joints needing to be targeted and pt's current bone cancer that is causing her joints to stiffen and become more rigid.    CLINICAL DECISION MAKING: Evolving/moderate complexity  EVALUATION COMPLEXITY: Moderate  PLAN:  PT FREQUENCY: 2x/week  PT DURATION: 12 weeks  PLANNED INTERVENTIONS: Therapeutic exercises, Therapeutic activity, Neuromuscular re-education, Balance training, Gait training, Patient/Family education, Self Care, Joint mobilization, Stair training, Vestibular training, Canalith repositioning, DME instructions, Aquatic Therapy, Dry Needling, Spinal mobilization, Cryotherapy, Moist heat, Manual therapy, and Re-evaluation  PLAN FOR NEXT SESSION:    Allow pt identifies as most pressing issue (shoulder vs neck, vs LE) and treat with strengthening or stretching program to assist with ROM and pain modulation.    Barrie Folk PT, DPT  Physical Therapist - [facility]  06/15/22, 10:32 AM

## 2022-06-16 ENCOUNTER — Ambulatory Visit (HOSPITAL_COMMUNITY): Payer: Commercial Managed Care - PPO

## 2022-06-17 ENCOUNTER — Inpatient Hospital Stay: Payer: Commercial Managed Care - PPO | Admitting: Pharmacist

## 2022-06-17 ENCOUNTER — Inpatient Hospital Stay: Payer: Commercial Managed Care - PPO

## 2022-06-17 ENCOUNTER — Encounter: Payer: Self-pay | Admitting: Hematology and Oncology

## 2022-06-18 ENCOUNTER — Ambulatory Visit (HOSPITAL_COMMUNITY)
Admission: RE | Admit: 2022-06-18 | Discharge: 2022-06-18 | Disposition: A | Discharge status: Home / Self Care | Payer: Commercial Managed Care - PPO | Source: Ambulatory Visit | Attending: Hematology and Oncology | Admitting: Hematology and Oncology

## 2022-06-18 DIAGNOSIS — C50512 Malignant neoplasm of lower-outer quadrant of left female breast: Secondary | ICD-10-CM | POA: Diagnosis present

## 2022-06-18 DIAGNOSIS — Z170 Estrogen receptor positive status [ER+]: Secondary | ICD-10-CM | POA: Diagnosis present

## 2022-06-18 MED ORDER — IOHEXOL 300 MG/ML  SOLN
100.0000 mL | Freq: Once | INTRAMUSCULAR | Status: AC | PRN
  Administered 2022-06-18: 100 mL via INTRAVENOUS

## 2022-06-20 NOTE — Progress Notes (Signed)
Patient Care Team: Ollen Bowl, MD as PCP - General (Internal Medicine) [name], MD as Consulting Physician (Hematology and Oncology) Almond Lint, MD as Consulting Physician (General Surgery) Dorothy Puffer, MD as Consulting Physician (Radiation Oncology)  DIAGNOSIS: No diagnosis found.  SUMMARY OF ONCOLOGIC HISTORY: Oncology History  Malignant neoplasm of lower-outer quadrant of left breast of female, estrogen receptor positive  06/11/2016 Mammogram   Palpable left breast masses 3:00 position: 2.2 cm; 5:30 position: 2.5 cm; 6:30 position: 0.7 cm   06/19/2016 Initial Diagnosis   Left breast biopsy 3:30: IDC with DCIS grade 1, ER 90%, PR 50%, Ki-67 15%, HER-2 negative ratio 1.13; biopsy 5:30 position: IDC grade 1   07/13/2016 Breast MRI   Large area of abnormal enhancement lower inner and lower outer quadrants left breast spanning 9 cm x 6.4 cm x 5.3 cm, no abnormal enlarged lymph nodes; T3 N0 stage II a (New AJCC staging)    07/15/2016 - 12/08/2017 Anti-estrogen oral therapy   Neoadjuvant anastrozole 1 mg daily   07/17/2016 Oncotype testing   Testing done on the biopsy: Oncotype DX score 22, intermediate risk   02/02/2017 Breast MRI   Left breast multicentric disease unchanged measuring 2.7 x 1.6 cm.  Mass in the non-mass enhancement are also not significantly changed measuring 6.2 x 2.4 cm. new enhancing mass within the outer right breast 7 mm which could be fat necrosis or inclusion cyst    02/09/2017 Imaging   Ultrasound of the right breast lesion noted on MRI: No sonographic finding corresponds to the abnormality noted on MRI   07/13/2017 Cancer Staging   Staging form: Breast, AJCC 8th Edition - Clinical stage from 07/13/2017: Stage IIA (cT3, cN0, cM0, G1, ER+, PR+, HER2-) - Signed by Loa Socks, NP on 05/18/2018   10/26/2017 Surgery   Left mastectomy: IDC grade 1, 2 foci largest spans 8.5 cm, intermediate grade DCIS, lymphovascular invasion identified,  perineural invasion identified, 1/2 lymph nodes positive with extracapsular extension, ER 9200%, PR 5 to 50%, HER-2 negative, Ki-67 10 to 15%, T3N1A Mammaprint: low risk   11/02/2017 Cancer Staging   Staging form: Breast, AJCC 8th Edition - Pathologic: No Stage Recommended (ypT3, pN1a, cM0, G1, ER+, PR+, HER2-) - Signed by [name], MD on 11/02/2017   12/08/2017 - 01/26/2018 Radiation Therapy   Adjuvant radiation therapy    02/2018 -  Anti-estrogen oral therapy   Anastrozole 1 mg daily adjuvant therapy     CHIEF COMPLIANT:  Follow-up left breast cancer/ Faslodex and Zometa Ibrance  INTERVAL HISTORY: Kathryn Lucas is a 62 y.o. with above-mentioned history of left breast cancer treated with mastectomy, radiation therapy, and is currently on anti-estrogen therapy with anastrozole. She presents to the clinic for labs and follow-up.    ALLERGIES:  is allergic to percocet [oxycodone-acetaminophen].  MEDICATIONS:  Current Outpatient Medications  Medication Sig Dispense Refill   anastrozole (ARIMIDEX) 1 MG tablet Take 1 tablet (1 mg total) by mouth daily. 90 tablet 3   Calcium 500-100 MG-UNIT CHEW Chew 1 tablet by mouth daily. 60 tablet    cholecalciferol (VITAMIN D3) 25 MCG (1000 UNIT) tablet Take 1 tablet (1,000 Units total) by mouth daily.     gabapentin (NEURONTIN) 300 MG capsule Take 300 mg by mouth 3 (three) times daily. (Patient not taking: Reported on 04/20/2022)     levothyroxine (SYNTHROID, LEVOTHROID) 175 MCG tablet Take 112 mcg by mouth.      LORazepam (ATIVAN) 0.5 MG tablet Take 1 tablet (0.5  mg total) by mouth at bedtime. (Patient not taking: Reported on 04/20/2022) 30 tablet 0   ondansetron (ZOFRAN) 8 MG tablet Take 1 tablet (8 mg total) by mouth every 8 (eight) hours as needed for nausea. (Patient not taking: Reported on 04/20/2022) 30 tablet 3   palbociclib (IBRANCE) 75 MG capsule Take 1 capsule (75 mg total) by mouth daily with breakfast. Take whole with food. Take for 21 days.  (As directed by MD) 21 capsule 6   vitamin C (ASCORBIC ACID) 250 MG tablet Take 1 tablet (250 mg total) by mouth daily.     zinc gluconate 50 MG tablet Take 1 tablet (50 mg total) by mouth daily.     No current facility-administered medications for this visit.    PHYSICAL EXAMINATION: ECOG PERFORMANCE STATUS: {CHL ONC ECOG PS:[phone]}  There were no vitals filed for this visit. There were no vitals filed for this visit.  BREAST:*** No palpable masses or nodules in either right or left breasts. No palpable axillary supraclavicular or infraclavicular adenopathy no breast tenderness or nipple discharge. (exam performed in the presence of a chaperone)  LABORATORY DATA:  I have reviewed the data as listed    Latest Ref Rng & Units 05/18/2022    1:27 PM 04/20/2022    1:44 PM 03/20/2022    5:11 PM  CMP  Glucose 70 - 99 mg/dL 82  83    BUN 8 - 23 mg/dL 20  15    Creatinine 4.090.44 - 1.00 mg/dL 8.110.73  9.140.70  7.820.70   Sodium 135 - 145 mmol/L 136  135    Potassium 3.5 - 5.1 mmol/L 4.2  4.3    Chloride 98 - 111 mmol/L 106  105    CO2 22 - 32 mmol/L 25  25    Calcium 8.9 - 10.3 mg/dL 9.1  9.4    Total Protein 6.5 - 8.1 g/dL 6.5  6.3    Total Bilirubin 0.3 - 1.2 mg/dL 0.5  0.6    Alkaline Phos 38 - 126 U/L 68  74    AST 15 - 41 U/L 89  94    ALT 0 - 44 U/L 21  41      Lab Results  Component Value Date   WBC 1.4 (L) 05/18/2022   HGB 12.1 05/18/2022   HCT 34.1 (L) 05/18/2022   MCV 102.1 (H) 05/18/2022   PLT 114 (L) 05/18/2022   NEUTROABS 0.8 (L) 05/18/2022    ASSESSMENT & PLAN:  No problem-specific Assessment & Plan notes found for this encounter.    No orders of the defined types were placed in this encounter.  The patient has a good understanding of the overall plan. she agrees with it. she will call with any problems that may develop before the next visit here. Total time spent: 30 mins including face to face time and time spent for planning, charting and co-ordination of care    Sherlyn LickDeritra L Rakesh Dutko, CMA 06/20/22    I Janan Ridgeeritra, Sarahmarie Leavey am acting as a Neurosurgeonscribe for [employer] Service[employer] CompanyDr.Vinay Gudena  ***

## 2022-06-22 ENCOUNTER — Encounter: Payer: Self-pay | Admitting: *Deleted

## 2022-06-22 ENCOUNTER — Inpatient Hospital Stay: Payer: Commercial Managed Care - PPO | Attending: Hematology and Oncology

## 2022-06-22 ENCOUNTER — Inpatient Hospital Stay (HOSPITAL_BASED_OUTPATIENT_CLINIC_OR_DEPARTMENT_OTHER): Payer: Commercial Managed Care - PPO | Admitting: Hematology and Oncology

## 2022-06-22 ENCOUNTER — Inpatient Hospital Stay: Payer: Commercial Managed Care - PPO

## 2022-06-22 VITALS — BP 139/82 | HR 111 | Temp 97.8°F | Resp 18 | Ht 70.0 in | Wt 152.8 lb

## 2022-06-22 DIAGNOSIS — C50512 Malignant neoplasm of lower-outer quadrant of left female breast: Secondary | ICD-10-CM | POA: Insufficient documentation

## 2022-06-22 DIAGNOSIS — Z170 Estrogen receptor positive status [ER+]: Secondary | ICD-10-CM | POA: Insufficient documentation

## 2022-06-22 DIAGNOSIS — C7951 Secondary malignant neoplasm of bone: Secondary | ICD-10-CM | POA: Diagnosis present

## 2022-06-22 DIAGNOSIS — Z79811 Long term (current) use of aromatase inhibitors: Secondary | ICD-10-CM | POA: Diagnosis not present

## 2022-06-22 DIAGNOSIS — Z9012 Acquired absence of left breast and nipple: Secondary | ICD-10-CM | POA: Insufficient documentation

## 2022-06-22 DIAGNOSIS — C799 Secondary malignant neoplasm of unspecified site: Secondary | ICD-10-CM

## 2022-06-22 DIAGNOSIS — Z5111 Encounter for antineoplastic chemotherapy: Secondary | ICD-10-CM | POA: Diagnosis not present

## 2022-06-22 DIAGNOSIS — Z923 Personal history of irradiation: Secondary | ICD-10-CM | POA: Insufficient documentation

## 2022-06-22 DIAGNOSIS — Z885 Allergy status to narcotic agent status: Secondary | ICD-10-CM | POA: Insufficient documentation

## 2022-06-22 LAB — CMP (CANCER CENTER ONLY)
ALT: 23 U/L (ref 0–44)
AST: 194 U/L (ref 15–41)
Albumin: 3.9 g/dL (ref 3.5–5.0)
Alkaline Phosphatase: 63 U/L (ref 38–126)
Anion gap: 7 (ref 5–15)
BUN: 13 mg/dL (ref 8–23)
CO2: 26 mmol/L (ref 22–32)
Calcium: 9.8 mg/dL (ref 8.9–10.3)
Chloride: 103 mmol/L (ref 98–111)
Creatinine: 0.73 mg/dL (ref 0.44–1.00)
GFR, Estimated: >60 mL/min (ref >60)
Glucose, Bld: 112 mg/dL — ABNORMAL HIGH (ref 70–99)
Potassium: 3.9 mmol/L (ref 3.5–5.1)
Sodium: 136 mmol/L (ref 135–145)
Total Bilirubin: 0.6 mg/dL (ref 0.3–1.2)
Total Protein: 6.8 g/dL (ref 6.5–8.1)

## 2022-06-22 LAB — CBC WITH DIFFERENTIAL (CANCER CENTER ONLY)
Abs Immature Granulocytes: 0 10*3/uL (ref 0.00–0.07)
Basophils Absolute: 0 10*3/uL (ref 0.0–0.1)
Basophils Relative: 2 %
Eosinophils Absolute: 0 10*3/uL (ref 0.0–0.5)
Eosinophils Relative: 1 %
HCT: 35.2 % — ABNORMAL LOW (ref 36.0–46.0)
Hemoglobin: 12.4 g/dL (ref 12.0–15.0)
Immature Granulocytes: 0 %
Lymphocytes Relative: 23 %
Lymphs Abs: 0.4 10*3/uL — ABNORMAL LOW (ref 0.7–4.0)
MCH: 36.6 pg — ABNORMAL HIGH (ref 26.0–34.0)
MCHC: 35.2 g/dL (ref 30.0–36.0)
MCV: 103.8 fL — ABNORMAL HIGH (ref 80.0–100.0)
Monocytes Absolute: 0.3 10*3/uL (ref 0.1–1.0)
Monocytes Relative: 18 %
Neutro Abs: 1.1 10*3/uL — ABNORMAL LOW (ref 1.7–7.7)
Neutrophils Relative %: 56 %
Platelet Count: 150 10*3/uL (ref 150–400)
RBC: 3.39 MIL/uL — ABNORMAL LOW (ref 3.87–5.11)
RDW: 14.4 % (ref 11.5–15.5)
WBC Count: 1.9 10*3/uL — ABNORMAL LOW (ref 4.0–10.5)
nRBC: 0 % (ref 0.0–0.2)

## 2022-06-22 MED ORDER — DENOSUMAB 120 MG/1.7ML ~~LOC~~ SOLN
120.0000 mg | Freq: Once | SUBCUTANEOUS | Status: AC
  Administered 2022-06-22: 120 mg via SUBCUTANEOUS
  Filled 2022-06-22: qty 1.7

## 2022-06-22 MED ORDER — FULVESTRANT 250 MG/5ML IM SOSY
500.0000 mg | PREFILLED_SYRINGE | Freq: Once | INTRAMUSCULAR | Status: AC
  Administered 2022-06-22: 500 mg via INTRAMUSCULAR
  Filled 2022-06-22: qty 10

## 2022-06-22 NOTE — Progress Notes (Signed)
CRITICAL VALUE STICKER  CRITICAL VALUE: AST [id number]  RECEIVER (on-site recipient of call): Adelina Mings, RN  DATE & TIME NOTIFIED: 06/22/22 at 1520  MD NOTIFIED:  [name], MD  TIME OF NOTIFICATION: 06/21/21 at 1525  RESPONSE: MD notified and verbalized understanding.

## 2022-06-22 NOTE — Assessment & Plan Note (Addendum)
06/19/2016 Left breast biopsy 3:30: IDC with DCIS grade 1, ER 90%, PR 50%, Ki-67 15%, HER-2 negative ratio 1.13; biopsy 5:30 position: IDC grade 1   10/26/17: Left mastectomy: IDC grade 1, 2 foci largest spans 8.5 cm, intermediate grade DCIS, lymphovascular invasion identified, perineural invasion identified, 1/2 lymph nodes positive with extracapsular extension, ER 9200%, PR 5 to 50%, HER-2 negative, Ki-67 10 to 15%, T3N1A   Oncotype DX score 22, intermediate risk, chemotherapy not felt to have significant benefit.   Treatment Summary: 1. Antiestrogen therapy with anastrozole 1 mg daily started 07/15/2016 2. Mastectomy 10/26/2017, Mammaprint low risk luminal type A 3. Followed by adjuvant radiation 12/08/17- 01/26/18  4. Followed by adjuvant antiestrogen therapy anastrozole started 01/17/2018 (originally started 07/15/2016) -------------------------------------------------------------------- Low back pain August 2023: Underwent CT myelogram: Large expansile lesion in the sacrum with extraosseous extension of the tumor, diffuse lytic lesions throughout the visualized spine with metastatic disease myeloma is considered less likely.  (This was ordered by Dr. Marcene Corning)   Treatment plan: 1.  PET CT scan 12/27/2021: Widespread bone metastatic disease largest lesion involving the sacrum with pathological fractures of T6 and T10 retroperitoneal and pelvic lymph node metastasis, right axillary lymph node, activity in the pancreatic head, hypermetabolic activity in the adrenal glands, right thyroid nodule. 2. biopsy of sacrum: 12/26/2021: Metastatic breast cancer, ER 90%, PR 10%, HER2 negative (0) 3.  Treatment plan: Ibrance along with Faslodex started 12/25/2021 4.  Xgeva for bone metastases.  Every 3 months 5.  Palliative radiation to the sacrum completed 01/19/2022 Genetic testing for BRCA  analysis. -------------------------------------------------------------------------------------------------------------------------------------- Current treatment: Ibrance with Faslodex and Zometa, started 12/25/2021 Toxicities: Leukopenia: Currently on Ibrance 75 mg.   Fatigue Alternating constipation and diarrhea Severe bone pain: Patient is not taking any pain medications currently because the pain is now under very good control without it.   CT chest 03/20/21: Stable disease in the multiple areas of the bone, pancreatic lesion, liver lesion, kidney lesions, adrenal lesions, lymph nodes: Stable  CT CAP: 06/18/2022: Overall appearance similar to previous exam.  Makes it lucent and sclerotic bone metastases similar.  Stable soft tissue nodule pancreas, stable adrenal nodules, small right axillary lymph node slightly decreased   Knee swelling: Saw orthopedics  Continue with the current treatment and she will come monthly for injections and in 3 months to follow-up with Jonny Ruiz.

## 2022-06-23 ENCOUNTER — Ambulatory Visit: Payer: Commercial Managed Care - PPO | Admitting: Hematology and Oncology

## 2022-06-24 ENCOUNTER — Ambulatory Visit: Payer: Commercial Managed Care - PPO | Admitting: Physical Therapy

## 2022-06-24 DIAGNOSIS — M542 Cervicalgia: Secondary | ICD-10-CM

## 2022-06-24 DIAGNOSIS — R29898 Other symptoms and signs involving the musculoskeletal system: Secondary | ICD-10-CM

## 2022-06-24 DIAGNOSIS — M436 Torticollis: Secondary | ICD-10-CM

## 2022-06-24 DIAGNOSIS — M25562 Pain in left knee: Secondary | ICD-10-CM

## 2022-06-24 DIAGNOSIS — M25611 Stiffness of right shoulder, not elsewhere classified: Secondary | ICD-10-CM

## 2022-06-24 DIAGNOSIS — R262 Difficulty in walking, not elsewhere classified: Secondary | ICD-10-CM

## 2022-06-24 DIAGNOSIS — M6281 Muscle weakness (generalized): Secondary | ICD-10-CM

## 2022-06-24 DIAGNOSIS — M25612 Stiffness of left shoulder, not elsewhere classified: Secondary | ICD-10-CM

## 2022-06-24 NOTE — Therapy (Signed)
OUTPATIENT PHYSICAL THERAPY NEURO TREATMENT   Patient Name: Kathryn Lucas B Vanderhoef MRN: [id number] DOB:07-12-1960, 62 y.o., female Today's Date: 06/15/2022   PCP: Ardean Larseninka Pahwani, MD REFERRING PROVIDER: Serena CroissantVinay Gudena, MD  END OF SESSION:  PT End of Session - 06/24/22 1309     Visit Number 9    Number of Visits 24    Date for PT Re-Evaluation 07/29/22    Authorization Type [employer]    PT Start Time 1302    PT Stop Time 1345    PT Time Calculation (min) 43 min    Equipment Utilized During Treatment Gait belt    Activity Tolerance Patient tolerated treatment well;Patient limited by pain    Behavior During Therapy WFL for tasks assessed/performed                 Past Medical History:  Diagnosis Date   Breast cancer (HCC)    left breast cancer   Cancer (HCC) 09/2017   left breast cancer   Family history of breast cancer    Hypothyroidism    Personal history of radiation therapy 2019   Thyroid disease    Past Surgical History:  Procedure Laterality Date   BREAST BIOPSY Left 2018   BREAST BIOPSY Left 2019   BREAST RECONSTRUCTION WITH PLACEMENT OF TISSUE EXPANDER AND ALLODERM Left 10/26/2017   Procedure: LEFT BREAST RECONSTRUCTION WITH PLACEMENT OF TISSUE EXPANDER AND ALLODERM;  Surgeon: Glenna Fellowshimmappa, Brinda, MD;  Location: [facility];  Service: Plastics;  Laterality: Left;   MASTECTOMY Left 2019   MASTECTOMY WITH RADIOACTIVE SEED GUIDED EXCISION AND AXILLARY SENTINEL LYMPH NODE BIOPSY Left 10/26/2017   Procedure: LEFT MASTECTOMY WITH SEED TARGETED  LEFT AXILLARY LYMPH NODE EXCISION AND LEFT SENTINEL LYMPH NODE BIOPSY;  Surgeon: Almond LintByerly, Faera, MD;  Location: [facility];  Service: General;  Laterality: Left;   TONSILLECTOMY     WISDOM TOOTH EXTRACTION     Patient Active Problem List   Diagnosis Date Noted   Metastatic malignant neoplasm 12/19/2021   Family history of breast cancer    History of therapeutic radiation 04/27/2018   Breast  cancer, left breast 10/26/2017   Malignant neoplasm of lower-outer quadrant of left breast of female, estrogen receptor positive 07/15/2016   Plantar fasciitis 07/30/2015   Metatarsalgia 10/18/2014    ONSET DATE: September 2023  REFERRING DIAG: C79.51 (ICD-10-CM) - Malignant neoplasm metastatic to bone (HCC)   THERAPY DIAG:  Acute pain of left knee  Muscle weakness (generalized)  Difficulty in walking, not elsewhere classified  Leg weakness, bilateral  Decreased range of motion of left shoulder  Decreased range of motion of right shoulder  Stiffness of cervical spine  Rationale for Evaluation and Treatment: Rehabilitation  SUBJECTIVE:  SUBJECTIVE STATEMENT:  Pt with good report from Oncologist. Pt states that lymph nodes for decreased in size and all other areas of CA have remained stable since prior scans.  Pt stated that she had infusion on 4/8, but passed out shortly aftern with fall to the floor. Staff at clinic was able to support her in assisted fall to floor. Mild pain in Bil knees from fall. Also reports that she has some pain in the RUE from pushing off hard fro mlow chair at friends house.    Pt accompanied by:  caregiver, Corrie Dandy  PERTINENT HISTORY:   Pt is 62 y.o. female that is seeking therapy for weakness of the R LE.  Pt tripped over the threshold of her door back in September of 2023, and had a transverse fracture of the L patella when she fell on it.  She was recently diagnosed with bone cancer back in October of 2023.  Pt notes that she has more stiffness in the neck and pelvis region because the cancer is located in those locations.  Pt is currently utilizing a hinged knee brace that she received from a friend for support of the L knee.  Secure Chat with referring provider  [name], MD, and asked for clarification on any precautions were present due to the pt's cancer diagnosis  Pt is cleared to have modalities performed including dry needling and heat applied per MD.   PAIN:  Are you having pain? No  PRECAUTIONS: Other: Cancer  WEIGHT BEARING RESTRICTIONS: Pt denies having any weightbearing restrictions on the knee  FALLS: Has patient fallen in last 6 months? Yes. Number of falls 1  LIVING ENVIRONMENT: Lives with: lives alone Lives in: House/apartment Stairs: No Has following equipment at home: Dan Humphreys - 4 wheeled, shower chair, and elevated toilet with bars on the side.  Pt is looking at putting in grab bars in the shower, but has assistant that helps with showers.  PLOF: Independent  PATIENT GOALS: To get stronger, be able to walk better.    OBJECTIVE:   DIAGNOSTIC FINDINGS:   CLINICAL DATA:  Left knee pain   EXAM: LEFT KNEE - 1-2 VIEW  COMPARISON:  October 2022  FINDINGS: Nonunion of left patellar fracture. No new acute fracture. Degenerative changes of the knee demonstrated by subchondral lucencies of the distal femur. Visualized soft tissues are unremarkable.  IMPRESSION: Nonunion of left patellar fracture.   COGNITION: Overall cognitive status: Within functional limits for tasks assessed   SENSATION: WFL   POSTURE: No Significant postural limitations.  Pt is however unable to rotate her neck or move her hips as much due to the cancer in the pt.    CERVICAL ROM:   Active ROM A/PROM (deg) eval  Flexion 36  Extension 16  Right lateral flexion 11  Left lateral flexion 10  Right rotation 16  Left rotation 26   (Blank rows = not tested)   LOWER EXTREMITY ROM:     Active  Right Eval Left Eval  Knee flexion 85 deg deferred  Knee extension 17 deg lacking  deferred    LOWER EXTREMITY MMT:    MMT Right Eval Left Eval  Hip flexion 4 limited by pain 4+  Knee flexion 4 deferred  Knee extension 4 deferred     UPPER EXTREMITY ROM:  Active ROM Right eval Left eval  Shoulder flexion 62 deg 99 deg  Shoulder scaption 60 deg 56 deg    UPPER EXTREMITY MMT:  MMT Right eval Left  eval  Shoulder flexion 3+ with pain 4  Shoulder abduction 3+ with pain 4  Grip strength 28# 38#     BED MOBILITY:  Supine to sit Modified independence, extra time necessary.   TRANSFERS: Assistive device utilized: None  Sit to stand: Modified independence Stand to sit: Modified independence Chair to chair: Modified independence     GAIT: Gait pattern: step through pattern, decreased step length- Right, decreased stance time- Left, and decreased stride length Distance walked: 40' Assistive device utilized: None Level of assistance: Modified independence Comments: Pt ambulates with significant stiffness due to the hinged knee brace on the L LE as well as the stiffness that she has in her joints from the cancer.  FUNCTIONAL TESTS:  5 times sit to stand: 23.43 sec from elevated mat table  Timed up and go (TUG): 23.33 sec 2 minute walk test: 211 10 meter walk test: 18.33 sec Berg Balance Scale: 43/56    PATIENT SURVEYS:  FOTO 46/59  TODAY'S TREATMENT: DATE: 06/15/22    TherEx:  Scifit UE 2 min forward/2 min reverse level 3-4 with instruction to keep RPM <40. Noted to have mild compensation on the RUE with scapular retraction over shoulder extension.   Sit<>stand from 22 inches on mat table with Push from bil thighs. x5. Mild use of momentum into standing cues for controlled descent.   Seated therex:  LAQ x 12 Bil Ankle PF/DF x 12 bil  Isometric hip adduction x10 bil  Hip flexion x 10 BLE  Standing therex at rail on wail:  Partial squat x 12 Lateral step over airex beam x 10 bil  Side stepping on airex beam x 5 bil with intermittent UE support      PATIENT EDUCATION: Education details: Pt educated throughout session about proper posture and technique with exercises. Improved  exercise technique, movement at target joints, use of target muscles after min to mod verbal, visual, tactile cues. Person educated: Patient and Nurse, mental health method: Explanation Education comprehension: verbalized understanding  HOME EXERCISE PROGRAM:  Access Code: [email or web address] URL: [email or web address] Date: 05/19/2022 Prepared by: Grier Rocher  Exercises - Seated Hip Abduction with Resistance  - 1 x daily - 7 x weekly - 3 sets - 10 reps - Standing Hip Extension Kicks  - 1 x daily - 7 x weekly - 3 sets - 10 reps - Standing Hip Abduction Kicks  - 1 x daily - 7 x weekly - 3 sets - 10 reps  Access Code: [email or web address] URL: [email or web address] Date: 05/14/2022 Prepared by: Tomasa Hose  Exercises - Isometric Shoulder Flexion at Wall  - 1 x daily - 7 x weekly - 3 sets - 10 reps - Isometric Shoulder Extension at Wall  - 1 x daily - 7 x weekly - 3 sets - 10 reps - Isometric Shoulder Abduction at Wall  - 1 x daily - 7 x weekly - 3 sets - 10 reps - Isometric Shoulder Adduction  - 1 x daily - 7 x weekly - 3 sets - 10 reps   GOALS:  Goals reviewed with patient? Yes  SHORT TERM GOALS: Target date: 06/03/2022  Pt will be independent with HEP in order to demonstrate increased ability to perform tasks related to occupation/hobbies. Baseline: Not given HEP at initial evaluation. Goal status: INITIAL   LONG TERM GOALS: Target date: 07/29/2022  Patient will demonstrate improved function as evidenced by a score of 59 on FOTO measure for full participation in activities at home and in the community. Baseline:  Evaluation: 46 Goal status: INITIAL  2.  Patient to demonstrate increased cervical rotation ROM to be 65 deg in order to return to PLOF and improving safety while driving. Baseline: 16/26 deg Goal status: INITIAL  3.  Pt to improve B shoulder ROM to be WFL (Flexion: 120 deg; Abduction: 130 deg) in order to improve ability to complete  tasks Baseline: R/L Shoulder Flex: 62/99; R/L Shoulder Scaption: 60/56 Goal status: INITIAL  4.  Pt to improve overall strength of the LE's in order to ambulate for longer distances and be able to tolerate surgery to repair the L knee. Baseline: Global 4/5 strength in the R LE, L LE MMT deferred due to the patella being broken at this time. Goal status: INITIAL  5.  Patient (> 66 years old) will complete five times sit to stand test in < 15 seconds indicating an increased LE strength and improved balance. Baseline: 23.43 sec Goal status: INITIAL   6.  Patient will reduce timed up and go to <11 seconds to reduce fall risk and demonstrate improved transfer/gait ability. Baseline: 23.33 sec Goal status: INITIAL  7.  Pt will improve BERG by at least 3 points in order to demonstrate clinically significant improvement in balance.  Baseline: 43 Goal status: INITIAL    ASSESSMENT:  CLINICAL IMPRESSION:  Pt continues to put forth excellent effort towards improved strength and balance throughout PT treatment. Pt reports fall at oncologist office with some residual knee discomfort. PT treatment focused on BLE strengthening within pain free range. Pt demonstrating no increased weakness following fall on 4/8.  Pt will continue to benefit from skilled therapy to address remaining deficits in order to improve overall QoL and return to PLOF.       OBJECTIVE IMPAIRMENTS: Abnormal gait, decreased activity tolerance, decreased balance, decreased endurance, decreased knowledge of use of DME, decreased mobility, difficulty walking, decreased ROM, decreased strength, hypomobility, impaired flexibility, impaired UE functional use, and pain  ACTIVITY LIMITATIONS: carrying, lifting, bending, sitting, standing, squatting, stairs, transfers, bed mobility, bathing, toileting, dressing, reach over head, hygiene/grooming, and locomotion level  PARTICIPATION LIMITATIONS: meal prep, cleaning, laundry, driving,  shopping, community activity, occupation, and yard work  PERSONAL FACTORS: Time since onset of injury/illness/exacerbation and 3+ comorbidities: Hx of breast cancer, bone cancer, thyroid disease  are also affecting patient's functional outcome.   REHAB POTENTIAL: Fair due to multiple joints needing to be targeted and pt's current bone cancer that is causing her joints to stiffen and become more rigid.    CLINICAL DECISION MAKING: Evolving/moderate complexity  EVALUATION COMPLEXITY: Moderate  PLAN:  PT FREQUENCY: 2x/week  PT DURATION: 12 weeks  PLANNED INTERVENTIONS: Therapeutic exercises, Therapeutic activity, Neuromuscular re-education, Balance training, Gait training, Patient/Family education, Self Care, Joint mobilization, Stair training, Vestibular training, Canalith repositioning, DME instructions, Aquatic Therapy, Dry Needling, Spinal mobilization, Cryotherapy, Moist heat, Manual therapy, and Re-evaluation  PLAN FOR NEXT SESSION:    Allow pt identifies as most pressing issue (shoulder vs neck, vs LE) and treat with strengthening or stretching program to assist with ROM and pain modulation.    Grier Rocher PT, DPT  Physical Therapist - [facility]  06/15/22, 10:32 AM

## 2022-06-25 ENCOUNTER — Encounter: Payer: Self-pay | Admitting: Hematology and Oncology

## 2022-06-26 ENCOUNTER — Other Ambulatory Visit: Payer: Commercial Managed Care - PPO

## 2022-06-26 VITALS — BP 112/70 | HR 116 | Temp 93.4°F

## 2022-06-26 DIAGNOSIS — Z515 Encounter for palliative care: Secondary | ICD-10-CM

## 2022-06-26 NOTE — Progress Notes (Signed)
PATIENT NAME: Kathryn Lucas DOB: [birth year] MRN: [id number]  PRIMARY CARE PROVIDER: Ollen Bowl, MD  RESPONSIBLE PARTY:  Acct ID - Guarantor Home Phone Work Phone Relationship Acct Type  [id number] ROXANNA, VANDERWALKER* [phone]  Self P/F     [address] [place], [place] [address]   Home visit completed with patient.   Appetite:  Continues to eat well.  Trying to eat 3 meals daily.  Eating healthy snacks such as fruits.  Breast Cancer:  Recently seen by oncology and had repeat scans.  Patient reports stability in cancer and decrease in lymph nodes under her right arm.  Patient continues with routine treatment.  She reported "fainting" during her last injection.  Patient attributes this to possibly low blood sugar as she did not eat much prior to her visit.  No there issues since this episode.   Physical Therapy:  Continues with weekly PT.  Doing quite well and feeling stronger.  Uncertain how much longer therapy will continue.   Wearing left knee brace.    Private Aide:  Continues to have a private aide coming to her home 2-3x weekly to assist with household chores and getting to appointments.  Overall, patient continues to have a very positive outlook.  She is hoping to take a trip to [place] with her sister to see family this summer. Her goal is to remain in her home and regain as much mobility as possible.   Follow up visit scheduled for next month.    CODE STATUS: Full ADVANCED DIRECTIVES: Yes MOST FORM: No PPS: 50%   PHYSICAL EXAM:   VITALS: Today's Vitals   06/26/22 1159  BP: 112/70  Pulse: (!) 116  Temp: (!) 93.4 F (34.1 C)  SpO2: 94%    LUNGS: clear to auscultation  CARDIAC: Cor RRR} tachy EXTREMITIES: - for edema SKIN: Skin color, texture, turgor normal. No rashes or lesions or mobility and turgor normal  NEURO: positive for gait problems       Truitt Merle, RN

## 2022-06-29 ENCOUNTER — Ambulatory Visit: Payer: Commercial Managed Care - PPO | Admitting: Hematology and Oncology

## 2022-07-01 ENCOUNTER — Ambulatory Visit: Payer: Commercial Managed Care - PPO | Admitting: Physical Therapy

## 2022-07-01 DIAGNOSIS — R29898 Other symptoms and signs involving the musculoskeletal system: Secondary | ICD-10-CM

## 2022-07-01 DIAGNOSIS — M6281 Muscle weakness (generalized): Secondary | ICD-10-CM

## 2022-07-01 DIAGNOSIS — M436 Torticollis: Secondary | ICD-10-CM

## 2022-07-01 DIAGNOSIS — M25611 Stiffness of right shoulder, not elsewhere classified: Secondary | ICD-10-CM

## 2022-07-01 DIAGNOSIS — M25562 Pain in left knee: Secondary | ICD-10-CM

## 2022-07-01 DIAGNOSIS — M25612 Stiffness of left shoulder, not elsewhere classified: Secondary | ICD-10-CM

## 2022-07-01 DIAGNOSIS — M542 Cervicalgia: Secondary | ICD-10-CM

## 2022-07-01 DIAGNOSIS — R262 Difficulty in walking, not elsewhere classified: Secondary | ICD-10-CM

## 2022-07-01 NOTE — Therapy (Signed)
OUTPATIENT PHYSICAL THERAPY NEURO TREATMENT PHYSICAL THERAPY PROGRESS NOTE   Dates of reporting period  05/06/2022   to   07/01/2022     Patient Name: Kathryn Lucas MRN: [id number] DOB:06/23/1960, 62 y.o., female Today's Date: 07/01/2022   PCP: Ardean Larsen, MD REFERRING PROVIDER: [name], MD  END OF SESSION:  PT End of Session - 07/01/22 1312     Visit Number 10    Number of Visits 24    Date for PT Re-Evaluation 07/29/22    Authorization Type [employer]    Progress Note Due on Visit 20    PT Start Time 1313    PT Stop Time 1353    PT Time Calculation (min) 40 min    Equipment Utilized During Treatment Gait belt    Activity Tolerance Patient tolerated treatment well;Patient limited by pain    Behavior During Therapy WFL for tasks assessed/performed                 Past Medical History:  Diagnosis Date   Breast cancer (HCC)    left breast cancer   Cancer (HCC) 09/2017   left breast cancer   Family history of breast cancer    Hypothyroidism    Personal history of radiation therapy 2019   Thyroid disease    Past Surgical History:  Procedure Laterality Date   BREAST BIOPSY Left 2018   BREAST BIOPSY Left 2019   BREAST RECONSTRUCTION WITH PLACEMENT OF TISSUE EXPANDER AND ALLODERM Left 10/26/2017   Procedure: LEFT BREAST RECONSTRUCTION WITH PLACEMENT OF TISSUE EXPANDER AND ALLODERM;  Surgeon: Glenna Fellows, MD;  Location: [facility];  Service: Plastics;  Laterality: Left;   MASTECTOMY Left 2019   MASTECTOMY WITH RADIOACTIVE SEED GUIDED EXCISION AND AXILLARY SENTINEL LYMPH NODE BIOPSY Left 10/26/2017   Procedure: LEFT MASTECTOMY WITH SEED TARGETED  LEFT AXILLARY LYMPH NODE EXCISION AND LEFT SENTINEL LYMPH NODE BIOPSY;  Surgeon: Almond Lint, MD;  Location: [facility];  Service: General;  Laterality: Left;   TONSILLECTOMY     WISDOM TOOTH EXTRACTION     Patient Active Problem List   Diagnosis Date Noted    Metastatic malignant neoplasm 12/19/2021   Family history of breast cancer    History of therapeutic radiation 04/27/2018   Breast cancer, left breast 10/26/2017   Malignant neoplasm of lower-outer quadrant of left breast of female, estrogen receptor positive 07/15/2016   Plantar fasciitis 07/30/2015   Metatarsalgia 10/18/2014    ONSET DATE: September 2023  REFERRING DIAG: C79.51 (ICD-10-CM) - Malignant neoplasm metastatic to bone (HCC)   THERAPY DIAG:  Acute pain of left knee  Difficulty in walking, not elsewhere classified  Muscle weakness (generalized)  Leg weakness, bilateral  Decreased range of motion of left shoulder  Decreased range of motion of right shoulder  Stiffness of cervical spine  Painful cervical ROM  Rationale for Evaluation and Treatment: Rehabilitation  SUBJECTIVE:  SUBJECTIVE STATEMENT:  Pt reports that she is doing well. Reports that she hit her R foot ion the 5th toe on  a piece of furniture with mild pain. No other updates.    Pt accompanied by:  caregiver, Corrie Dandy  PERTINENT HISTORY:   Pt is 62 y.o. female that is seeking therapy for weakness of the R LE.  Pt tripped over the threshold of her door back in September of 2023, and had a transverse fracture of the L patella when she fell on it.  She was recently diagnosed with bone cancer back in October of 2023.  Pt notes that she has more stiffness in the neck and pelvis region because the cancer is located in those locations.  Pt is currently utilizing a hinged knee brace that she received from a friend for support of the L knee.  Secure Chat with referring provider [name], MD, and asked for clarification on any precautions were present due to the pt's cancer diagnosis  Pt is cleared to have modalities performed  including dry needling and heat applied per MD.   PAIN:  Are you having pain? No  PRECAUTIONS: Other: Cancer  WEIGHT BEARING RESTRICTIONS: Pt denies having any weightbearing restrictions on the knee  FALLS: Has patient fallen in last 6 months? Yes. Number of falls 1  LIVING ENVIRONMENT: Lives with: lives alone Lives in: House/apartment Stairs: No Has following equipment at home: Dan Humphreys - 4 wheeled, shower chair, and elevated toilet with bars on the side.  Pt is looking at putting in grab bars in the shower, but has assistant that helps with showers.  PLOF: Independent  PATIENT GOALS: To get stronger, be able to walk better.    OBJECTIVE:   DIAGNOSTIC FINDINGS:   CLINICAL DATA:  Left knee pain   EXAM: LEFT KNEE - 1-2 VIEW  COMPARISON:  October 2022  FINDINGS: Nonunion of left patellar fracture. No new acute fracture. Degenerative changes of the knee demonstrated by subchondral lucencies of the distal femur. Visualized soft tissues are unremarkable.  IMPRESSION: Nonunion of left patellar fracture.   COGNITION: Overall cognitive status: Within functional limits for tasks assessed   SENSATION: WFL   POSTURE: No Significant postural limitations.  Pt is however unable to rotate her neck or move her hips as much due to the cancer in the pt.    CERVICAL ROM:   Active ROM A/PROM (deg) eval AROM 4/17   Flexion 36   Extension 16   Right lateral flexion 11   Left lateral flexion 10   Right rotation 16 28  Left rotation 26 39    (Blank rows = not tested)   LOWER EXTREMITY ROM:     Active  Right Eval Left Eval  Knee flexion 85 deg deferred  Knee extension 17 deg lacking  deferred    LOWER EXTREMITY MMT:    MMT Right Eval Left Eval  Hip flexion 4+ 4  Knee flexion 4+ 4  Knee extension 4+ 4-    UPPER EXTREMITY ROM:  Active ROM Right eval Left eval R 4/17 L 4/17   Shoulder flexion 62 deg 99 deg 76 105   Shoulder scaption 60 deg 56 deg 76  91     UPPER EXTREMITY MMT:  MMT Right eval Left eval  Shoulder flexion 3+ with pain 4  Shoulder abduction 3+ with pain 4  Grip strength 28# 38#     BED MOBILITY:  Supine to sit Modified independence, extra time necessary.   TRANSFERS:  Assistive device utilized: None  Sit to stand: Modified independence Stand to sit: Modified independence Chair to chair: Modified independence     GAIT: Gait pattern: step through pattern, decreased step length- Right, decreased stance time- Left, and decreased stride length Distance walked: 40' Assistive device utilized: None Level of assistance: Modified independence Comments: Pt ambulates with significant stiffness due to the hinged knee brace on the L LE as well as the stiffness that she has in her joints from the cancer.  FUNCTIONAL TESTS:  5 times sit to stand: 23.43 sec from elevated mat table; 4/17: 16.53sec from slightly elevated mat table   Timed up and go (TUG): 23.33 sec; 4/17: 13.61 average of 2 trials  2 minute walk test: 211 10 meter walk test: 18.33 sec Berg Balance Scale: 43/56; 4/17: 48/56     PATIENT SURVEYS:  FOTO 46/59  TODAY'S TREATMENT: DATE: 07/01/22   PT instructed pt in progress not assessment to measure progress towards LTG. See below for details      PATIENT EDUCATION: Education details: Pt educated throughout session about proper posture and technique with exercises. Improved exercise technique, movement at target joints, use of target muscles after min to mod verbal, visual, tactile cues. Person educated: Patient and Nurse, mental health method: Explanation Education comprehension: verbalized understanding  HOME EXERCISE PROGRAM:  Access Code: [email or web address] URL: [email or web address] Date: 05/19/2022 Prepared by: Grier Rocher  Exercises - Seated Hip Abduction with Resistance  - 1 x daily - 7 x weekly - 3 sets - 10 reps - Standing Hip Extension Kicks  - 1 x daily - 7 x weekly -  3 sets - 10 reps - Standing Hip Abduction Kicks  - 1 x daily - 7 x weekly - 3 sets - 10 reps  Access Code: [email or web address] URL: [email or web address] Date: 05/14/2022 Prepared by: Tomasa Hose  Exercises - Isometric Shoulder Flexion at Wall  - 1 x daily - 7 x weekly - 3 sets - 10 reps - Isometric Shoulder Extension at Wall  - 1 x daily - 7 x weekly - 3 sets - 10 reps - Isometric Shoulder Abduction at Wall  - 1 x daily - 7 x weekly - 3 sets - 10 reps - Isometric Shoulder Adduction  - 1 x daily - 7 x weekly - 3 sets - 10 reps   GOALS:  Goals reviewed with patient? Yes  SHORT TERM GOALS: Target date: 06/03/2022  Pt will be independent with HEP in order to demonstrate increased ability to perform tasks related to occupation/hobbies. Baseline: Not given HEP at initial evaluation. Goal status: MET   LONG TERM GOALS: Target date: 07/29/2022  Patient will demonstrate improved function as evidenced by a score of 59 on FOTO measure for full participation in activities at home and in the community. Baseline: Evaluation: 46, 4/17: 68 Goal status: MET  2.  Patient to demonstrate increased cervical rotation ROM to be 65 deg in order to return to PLOF and improving safety while driving. Baseline: 16/26 deg 4/17 28/39 DEG Goal status: IN PROGRESS  3.  Pt to improve B shoulder ROM to be WFL (Flexion: 120 deg; Abduction: 130 deg) in order to improve ability to complete tasks Baseline: R/L Shoulder Flex: 62/99; R/L Shoulder Scaption: 60/56 4/17: shoulder flexion 76/105 shoulder scaption 76/91 Goal status: INITIAL  4.  Pt to improve overall strength of the LE's in order to ambulate for longer distances and be able to tolerate surgery to repair the L knee. Baseline: Global 4/5 strength  in the R LE, L LE MMT deferred due to the patella being broken at this time. 4/17 improved strength on the RLE and LLE with knee extension 4-/5, hip flexion and knee flexion 4/5.   Goal status: IN PROGRESS    5.  Patient (> 47 years old) will complete five times sit to stand test in < 15 seconds indicating an increased LE strength and improved balance. Baseline: 23.43 sec 4/17: 16.53sec from slightly elevatedd mat table  Goal status: in progress.    6.  Patient will reduce timed up and go to <11 seconds to reduce fall risk and demonstrate improved transfer/gait ability. Baseline: 23.33 sec 4/17: 13.61 average of 2 trials  Goal status:  IN PROGRESS  7.  Pt will improve BERG by at least 3 points in order to demonstrate clinically significant improvement in balance.  Baseline: 43 4/17: 48/56 Goal status: MET     ASSESSMENT:  CLINICAL IMPRESSION:  Pt put forth good effort in progress assessment. Noted improvement in function and ROM with increased score on Foto, Berg, TUG, 5xSTS as well as improved strength in BLE and increased cervical and shoulder ROM on BUE. Patient's condition has the potential to improve in response to therapy. Maximum improvement is yet to be obtained. The anticipated improvement is attainable and reasonable in a generally predictable time.  Pt will continue to benefit from skilled therapy to address remaining deficits in order to improve overall QoL and return to PLOF.       OBJECTIVE IMPAIRMENTS: Abnormal gait, decreased activity tolerance, decreased balance, decreased endurance, decreased knowledge of use of DME, decreased mobility, difficulty walking, decreased ROM, decreased strength, hypomobility, impaired flexibility, impaired UE functional use, and pain  ACTIVITY LIMITATIONS: carrying, lifting, bending, sitting, standing, squatting, stairs, transfers, bed mobility, bathing, toileting, dressing, reach over head, hygiene/grooming, and locomotion level  PARTICIPATION LIMITATIONS: meal prep, cleaning, laundry, driving, shopping, community activity, occupation, and yard work  PERSONAL FACTORS: Time since onset of injury/illness/exacerbation and 3+ comorbidities: Hx  of breast cancer, bone cancer, thyroid disease  are also affecting patient's functional outcome.   REHAB POTENTIAL: Fair due to multiple joints needing to be targeted and pt's current bone cancer that is causing her joints to stiffen and become more rigid.    CLINICAL DECISION MAKING: Evolving/moderate complexity  EVALUATION COMPLEXITY: Moderate  PLAN:  PT FREQUENCY: 2x/week  PT DURATION: 12 weeks  PLANNED INTERVENTIONS: Therapeutic exercises, Therapeutic activity, Neuromuscular re-education, Balance training, Gait training, Patient/Family education, Self Care, Joint mobilization, Stair training, Vestibular training, Canalith repositioning, DME instructions, Aquatic Therapy, Dry Needling, Spinal mobilization, Cryotherapy, Moist heat, Manual therapy, and Re-evaluation  PLAN FOR NEXT SESSION:    Allow pt identifies as most pressing issue (shoulder vs neck, vs LE) and treat with strengthening or stretching program to assist with ROM and pain modulation.    Grier Rocher PT, DPT  Physical Therapist - [facility]  07/01/22, 2:05 PM

## 2022-07-07 ENCOUNTER — Ambulatory Visit: Payer: Commercial Managed Care - PPO | Admitting: Physical Therapy

## 2022-07-07 DIAGNOSIS — M25562 Pain in left knee: Secondary | ICD-10-CM

## 2022-07-07 DIAGNOSIS — R29898 Other symptoms and signs involving the musculoskeletal system: Secondary | ICD-10-CM

## 2022-07-07 DIAGNOSIS — M542 Cervicalgia: Secondary | ICD-10-CM

## 2022-07-07 DIAGNOSIS — M25612 Stiffness of left shoulder, not elsewhere classified: Secondary | ICD-10-CM

## 2022-07-07 DIAGNOSIS — M6281 Muscle weakness (generalized): Secondary | ICD-10-CM

## 2022-07-07 DIAGNOSIS — M436 Torticollis: Secondary | ICD-10-CM

## 2022-07-07 DIAGNOSIS — R262 Difficulty in walking, not elsewhere classified: Secondary | ICD-10-CM

## 2022-07-07 DIAGNOSIS — M25611 Stiffness of right shoulder, not elsewhere classified: Secondary | ICD-10-CM

## 2022-07-07 NOTE — Therapy (Signed)
OUTPATIENT PHYSICAL THERAPY NEURO TREATMENT    Patient Name: Kathryn Lucas MRN: [id number] DOB:1960/04/25, 62 y.o., female Today's Date: 07/07/2022   PCP: Ardean Larsen, MD REFERRING PROVIDER: [name], MD  END OF SESSION:  PT End of Session - 07/07/22 1618     Visit Number 11    Number of Visits 24    Date for PT Re-Evaluation 07/29/22    Authorization Type [employer]    Progress Note Due on Visit 20    PT Start Time 1606    PT Stop Time 1646    PT Time Calculation (min) 40 min    Equipment Utilized During Treatment Gait belt    Activity Tolerance Patient tolerated treatment well;Patient limited by pain    Behavior During Therapy WFL for tasks assessed/performed                 Past Medical History:  Diagnosis Date   Breast cancer (HCC)    left breast cancer   Cancer (HCC) 09/2017   left breast cancer   Family history of breast cancer    Hypothyroidism    Personal history of radiation therapy 2019   Thyroid disease    Past Surgical History:  Procedure Laterality Date   BREAST BIOPSY Left 2018   BREAST BIOPSY Left 2019   BREAST RECONSTRUCTION WITH PLACEMENT OF TISSUE EXPANDER AND ALLODERM Left 10/26/2017   Procedure: LEFT BREAST RECONSTRUCTION WITH PLACEMENT OF TISSUE EXPANDER AND ALLODERM;  Surgeon: Glenna Fellows, MD;  Location: [facility];  Service: Plastics;  Laterality: Left;   MASTECTOMY Left 2019   MASTECTOMY WITH RADIOACTIVE SEED GUIDED EXCISION AND AXILLARY SENTINEL LYMPH NODE BIOPSY Left 10/26/2017   Procedure: LEFT MASTECTOMY WITH SEED TARGETED  LEFT AXILLARY LYMPH NODE EXCISION AND LEFT SENTINEL LYMPH NODE BIOPSY;  Surgeon: Almond Lint, MD;  Location: [facility];  Service: General;  Laterality: Left;   TONSILLECTOMY     WISDOM TOOTH EXTRACTION     Patient Active Problem List   Diagnosis Date Noted   Metastatic malignant neoplasm 12/19/2021   Family history of breast cancer    History of  therapeutic radiation 04/27/2018   Breast cancer, left breast 10/26/2017   Malignant neoplasm of lower-outer quadrant of left breast of female, estrogen receptor positive 07/15/2016   Plantar fasciitis 07/30/2015   Metatarsalgia 10/18/2014    ONSET DATE: September 2023  REFERRING DIAG: C79.51 (ICD-10-CM) - Malignant neoplasm metastatic to bone (HCC)   THERAPY DIAG:  Acute pain of left knee  Difficulty in walking, not elsewhere classified  Muscle weakness (generalized)  Stiffness of cervical spine  Leg weakness, bilateral  Decreased range of motion of left shoulder  Decreased range of motion of right shoulder  Painful cervical ROM  Rationale for Evaluation and Treatment: Rehabilitation  SUBJECTIVE:  SUBJECTIVE STATEMENT:  Pt reports that she is doing well. Increased UE and cervical tightness on BUE from UT into Cspine.    Pt accompanied by:  caregiver, Corrie Dandy  PERTINENT HISTORY:   Pt is 62 y.o. female that is seeking therapy for weakness of the R LE.  Pt tripped over the threshold of her door back in September of 2023, and had a transverse fracture of the L patella when she fell on it.  She was recently diagnosed with bone cancer back in October of 2023.  Pt notes that she has more stiffness in the neck and pelvis region because the cancer is located in those locations.  Pt is currently utilizing a hinged knee brace that she received from a friend for support of the L knee.  Secure Chat with referring provider [name], MD, and asked for clarification on any precautions were present due to the pt's cancer diagnosis  Pt is cleared to have modalities performed including dry needling and heat applied per MD.   PAIN:  Are you having pain? No  PRECAUTIONS: Other: Cancer  WEIGHT BEARING  RESTRICTIONS: Pt denies having any weightbearing restrictions on the knee  FALLS: Has patient fallen in last 6 months? Yes. Number of falls 1  LIVING ENVIRONMENT: Lives with: lives alone Lives in: House/apartment Stairs: No Has following equipment at home: Dan Humphreys - 4 wheeled, shower chair, and elevated toilet with bars on the side.  Pt is looking at putting in grab bars in the shower, but has assistant that helps with showers.  PLOF: Independent  PATIENT GOALS: To get stronger, be able to walk better.    OBJECTIVE:   DIAGNOSTIC FINDINGS:   CLINICAL DATA:  Left knee pain   EXAM: LEFT KNEE - 1-2 VIEW  COMPARISON:  October 2022  FINDINGS: Nonunion of left patellar fracture. No new acute fracture. Degenerative changes of the knee demonstrated by subchondral lucencies of the distal femur. Visualized soft tissues are unremarkable.  IMPRESSION: Nonunion of left patellar fracture.   COGNITION: Overall cognitive status: Within functional limits for tasks assessed   SENSATION: WFL   POSTURE: No Significant postural limitations.  Pt is however unable to rotate her neck or move her hips as much due to the cancer in the pt.    CERVICAL ROM:   Active ROM A/PROM (deg) eval AROM 4/17   Flexion 36   Extension 16   Right lateral flexion 11   Left lateral flexion 10   Right rotation 16 28  Left rotation 26 39    (Blank rows = not tested)   LOWER EXTREMITY ROM:     Active  Right Eval Left Eval  Knee flexion 85 deg deferred  Knee extension 17 deg lacking  deferred    LOWER EXTREMITY MMT:    MMT Right Eval Left Eval  Hip flexion 4+ 4  Knee flexion 4+ 4  Knee extension 4+ 4-    UPPER EXTREMITY ROM:  Active ROM Right eval Left eval R 4/17 L 4/17   Shoulder flexion 62 deg 99 deg 76 105   Shoulder scaption 60 deg 56 deg 76  91    UPPER EXTREMITY MMT:  MMT Right eval Left eval  Shoulder flexion 3+ with pain 4  Shoulder abduction 3+ with pain 4  Grip  strength 28# 38#     BED MOBILITY:  Supine to sit Modified independence, extra time necessary.   TRANSFERS: Assistive device utilized: None  Sit to stand: Modified independence Stand to  sit: Modified independence Chair to chair: Modified independence     GAIT: Gait pattern: step through pattern, decreased step length- Right, decreased stance time- Left, and decreased stride length Distance walked: 40' Assistive device utilized: None Level of assistance: Modified independence Comments: Pt ambulates with significant stiffness due to the hinged knee brace on the L LE as well as the stiffness that she has in her joints from the cancer.  FUNCTIONAL TESTS:  5 times sit to stand: 23.43 sec from elevated mat table; 4/17: 16.53sec from slightly elevated mat table   Timed up and go (TUG): 23.33 sec; 4/17: 13.61 average of 2 trials  2 minute walk test: 211 10 meter walk test: 18.33 sec Berg Balance Scale: 43/56; 4/17: 48/56     PATIENT SURVEYS:  FOTO 46/59  TODAY'S TREATMENT: DATE: 07/07/22    Sci fit UBE. 3 min forward 2.5 min reverse with 2 min rest break between bouts  Squat with BUE supported on rail with cues AAROM into shoulder flexion with squat x 10   Pt reports increased cervical tightness over the last few days.  PT performed STM for UT with trigger point release x 5 min then performed  with cervical rotation x 1 min bil and with lateral flexion x 1 min bil.   Performed mid row 2 x 10 with RTB.  Shoulder flexion  with RTB 2x 10 bil   Sit<>stand x 5 with 1 UE pushing from seat with 1 cushion in place.   PATIENT EDUCATION: Education details: Pt educated throughout session about proper posture and technique with exercises. Improved exercise technique, movement at target joints, use of target muscles after min to mod verbal, visual, tactile cues. Person educated: Patient and Nurse, mental health method: Explanation Education comprehension: verbalized  understanding  HOME EXERCISE PROGRAM:  Access Code: [email or web address] URL: [email or web address] Date: 05/19/2022 Prepared by: Grier Rocher  Exercises - Seated Hip Abduction with Resistance  - 1 x daily - 7 x weekly - 3 sets - 10 reps - Standing Hip Extension Kicks  - 1 x daily - 7 x weekly - 3 sets - 10 reps - Standing Hip Abduction Kicks  - 1 x daily - 7 x weekly - 3 sets - 10 reps  Access Code: [email or web address] URL: [email or web address] Date: 05/14/2022 Prepared by: Tomasa Hose  Exercises - Isometric Shoulder Flexion at Wall  - 1 x daily - 7 x weekly - 3 sets - 10 reps - Isometric Shoulder Extension at Wall  - 1 x daily - 7 x weekly - 3 sets - 10 reps - Isometric Shoulder Abduction at Wall  - 1 x daily - 7 x weekly - 3 sets - 10 reps - Isometric Shoulder Adduction  - 1 x daily - 7 x weekly - 3 sets - 10 reps   GOALS:  Goals reviewed with patient? Yes  SHORT TERM GOALS: Target date: 06/03/2022  Pt will be independent with HEP in order to demonstrate increased ability to perform tasks related to occupation/hobbies. Baseline: Not given HEP at initial evaluation. Goal status: MET   LONG TERM GOALS: Target date: 07/29/2022  Patient will demonstrate improved function as evidenced by a score of 59 on FOTO measure for full participation in activities at home and in the community. Baseline: Evaluation: 46, 4/17: 68 Goal status: MET  2.  Patient to demonstrate increased cervical rotation ROM to be 65 deg in order to return to PLOF and improving safety while driving. Baseline: 16/26 deg 4/17 28/39 DEG Goal  status: IN PROGRESS  3.  Pt to improve B shoulder ROM to be WFL (Flexion: 120 deg; Abduction: 130 deg) in order to improve ability to complete tasks Baseline: R/L Shoulder Flex: 62/99; R/L Shoulder Scaption: 60/56 4/17: shoulder flexion 76/105 shoulder scaption 76/91 Goal status: INITIAL  4.  Pt to improve overall strength of the LE's in order to ambulate for  longer distances and be able to tolerate surgery to repair the L knee. Baseline: Global 4/5 strength in the R LE, L LE MMT deferred due to the patella being broken at this time. 4/17 improved strength on the RLE and LLE with knee extension 4-/5, hip flexion and knee flexion 4/5.   Goal status: IN PROGRESS   5.  Patient (> 53 years old) will complete five times sit to stand test in < 15 seconds indicating an increased LE strength and improved balance. Baseline: 23.43 sec 4/17: 16.53sec from slightly elevatedd mat table  Goal status: in progress.    6.  Patient will reduce timed up and go to <11 seconds to reduce fall risk and demonstrate improved transfer/gait ability. Baseline: 23.33 sec 4/17: 13.61 average of 2 trials  Goal status:  IN PROGRESS  7.  Pt will improve BERG by at least 3 points in order to demonstrate clinically significant improvement in balance.  Baseline: 43 4/17: 48/56 Goal status: MET     ASSESSMENT:  CLINICAL IMPRESSION:  Pt put forth good effort inPT treatment. PT session focused on improved ROM in BUE and cervical spine as well as STM for improved muscle extensibility and pain modulation. Pt reports mild decrease in cervical tightness in Cervical regionn upon completion of manual therapy.  Pt will continue to benefit from skilled therapy to address remaining deficits in order to improve overall QoL and return to PLOF.       OBJECTIVE IMPAIRMENTS: Abnormal gait, decreased activity tolerance, decreased balance, decreased endurance, decreased knowledge of use of DME, decreased mobility, difficulty walking, decreased ROM, decreased strength, hypomobility, impaired flexibility, impaired UE functional use, and pain  ACTIVITY LIMITATIONS: carrying, lifting, bending, sitting, standing, squatting, stairs, transfers, bed mobility, bathing, toileting, dressing, reach over head, hygiene/grooming, and locomotion level  PARTICIPATION LIMITATIONS: meal prep, cleaning, laundry,  driving, shopping, community activity, occupation, and yard work  PERSONAL FACTORS: Time since onset of injury/illness/exacerbation and 3+ comorbidities: Hx of breast cancer, bone cancer, thyroid disease  are also affecting patient's functional outcome.   REHAB POTENTIAL: Fair due to multiple joints needing to be targeted and pt's current bone cancer that is causing her joints to stiffen and become more rigid.    CLINICAL DECISION MAKING: Evolving/moderate complexity  EVALUATION COMPLEXITY: Moderate  PLAN:  PT FREQUENCY: 2x/week  PT DURATION: 12 weeks  PLANNED INTERVENTIONS: Therapeutic exercises, Therapeutic activity, Neuromuscular re-education, Balance training, Gait training, Patient/Family education, Self Care, Joint mobilization, Stair training, Vestibular training, Canalith repositioning, DME instructions, Aquatic Therapy, Dry Needling, Spinal mobilization, Cryotherapy, Moist heat, Manual therapy, and Re-evaluation  PLAN FOR NEXT SESSION:    Allow pt identifies as most pressing issue (shoulder vs neck, vs LE) and treat with strengthening or stretching program to assist with ROM and pain modulation.    Grier Rocher PT, DPT  Physical Therapist - [facility]  07/07/22, 5:01 PM

## 2022-07-09 ENCOUNTER — Ambulatory Visit: Payer: Commercial Managed Care - PPO | Admitting: Physical Therapy

## 2022-07-09 DIAGNOSIS — M25612 Stiffness of left shoulder, not elsewhere classified: Secondary | ICD-10-CM

## 2022-07-09 DIAGNOSIS — M25562 Pain in left knee: Secondary | ICD-10-CM

## 2022-07-09 DIAGNOSIS — M542 Cervicalgia: Secondary | ICD-10-CM

## 2022-07-09 DIAGNOSIS — M6281 Muscle weakness (generalized): Secondary | ICD-10-CM

## 2022-07-09 DIAGNOSIS — R29898 Other symptoms and signs involving the musculoskeletal system: Secondary | ICD-10-CM

## 2022-07-09 DIAGNOSIS — M436 Torticollis: Secondary | ICD-10-CM

## 2022-07-09 DIAGNOSIS — M25611 Stiffness of right shoulder, not elsewhere classified: Secondary | ICD-10-CM

## 2022-07-09 DIAGNOSIS — R262 Difficulty in walking, not elsewhere classified: Secondary | ICD-10-CM

## 2022-07-09 NOTE — Therapy (Signed)
OUTPATIENT PHYSICAL THERAPY NEURO TREATMENT    Patient Name: Kathryn Lucas MRN: [id number] DOB:04/19/1960, 62 y.o., female Today's Date: 07/09/2022   PCP: Ardean Larsen, MD REFERRING PROVIDER: [name], MD  END OF SESSION:  PT End of Session - 07/09/22 1025     Visit Number 12    Number of Visits 24    Date for PT Re-Evaluation 07/29/22    Authorization Type [employer]    Progress Note Due on Visit 20    PT Start Time 1020    PT Stop Time 1100    PT Time Calculation (min) 40 min    Equipment Utilized During Treatment Gait belt    Activity Tolerance Patient tolerated treatment well;Patient limited by pain    Behavior During Therapy WFL for tasks assessed/performed                 Past Medical History:  Diagnosis Date   Breast cancer (HCC)    left breast cancer   Cancer (HCC) 09/2017   left breast cancer   Family history of breast cancer    Hypothyroidism    Personal history of radiation therapy 2019   Thyroid disease    Past Surgical History:  Procedure Laterality Date   BREAST BIOPSY Left 2018   BREAST BIOPSY Left 2019   BREAST RECONSTRUCTION WITH PLACEMENT OF TISSUE EXPANDER AND ALLODERM Left 10/26/2017   Procedure: LEFT BREAST RECONSTRUCTION WITH PLACEMENT OF TISSUE EXPANDER AND ALLODERM;  Surgeon: Glenna Fellows, MD;  Location: [facility];  Service: Plastics;  Laterality: Left;   MASTECTOMY Left 2019   MASTECTOMY WITH RADIOACTIVE SEED GUIDED EXCISION AND AXILLARY SENTINEL LYMPH NODE BIOPSY Left 10/26/2017   Procedure: LEFT MASTECTOMY WITH SEED TARGETED  LEFT AXILLARY LYMPH NODE EXCISION AND LEFT SENTINEL LYMPH NODE BIOPSY;  Surgeon: Almond Lint, MD;  Location: [facility];  Service: General;  Laterality: Left;   TONSILLECTOMY     WISDOM TOOTH EXTRACTION     Patient Active Problem List   Diagnosis Date Noted   Metastatic malignant neoplasm 12/19/2021   Family history of breast cancer    History of  therapeutic radiation 04/27/2018   Breast cancer, left breast 10/26/2017   Malignant neoplasm of lower-outer quadrant of left breast of female, estrogen receptor positive 07/15/2016   Plantar fasciitis 07/30/2015   Metatarsalgia 10/18/2014    ONSET DATE: September 2023  REFERRING DIAG: C79.51 (ICD-10-CM) - Malignant neoplasm metastatic to bone (HCC)   THERAPY DIAG:  Acute pain of left knee  Difficulty in walking, not elsewhere classified  Muscle weakness (generalized)  Leg weakness, bilateral  Stiffness of cervical spine  Decreased range of motion of left shoulder  Decreased range of motion of right shoulder  Painful cervical ROM  Rationale for Evaluation and Treatment: Rehabilitation  SUBJECTIVE:  SUBJECTIVE STATEMENT:  Pt reports that she is doing well. Pain in the R shoulder from pushing off low seat height over the weekend. Requesting to focus PT treatment on BLE strength.    Pt accompanied by:  caregiver, Mary  PERTINCorrie DandyNT HISTORY:   Pt is 62 y.o. female that is seeking therapy for weakness of the R LE.  Pt tripped over the threshold of her door back in September of 2023, and had a transverse fracture of the L patella when she fell on it.  She was recently diagnosed with bone cancer back in October of 2023.  Pt notes that she has more stiffness in the neck and pelvis region because the cancer is located in those locations.  Pt is currently utilizing a hinged knee brace that she received from a friend for support of the L knee.  Secure Chat with referring provider [name], MD, and asked for clarification on any precautions were present due to the pt's cancer diagnosis  Pt is cleared to have modalities performed including dry needling and heat applied per MD.   PAIN:  Are you  having pain? No  PRECAUTIONS: Other: Cancer  WEIGHT BEARING RESTRICTIONS: Pt denies having any weightbearing restrictions on the knee  FALLS: Has patient fallen in last 6 months? Yes. Number of falls 1  LIVING ENVIRONMENT: Lives with: lives alone Lives in: House/apartment Stairs: No Has following equipment at home: Dan Humphreys - 4 wheeled, shower chair, and elevated toilet with bars on the side.  Pt is looking at putting in grab bars in the shower, but has assistant that helps with showers.  PLOF: Independent  PATIENT GOALS: To get stronger, be able to walk better.    OBJECTIVE:   DIAGNOSTIC FINDINGS:   CLINICAL DATA:  Left knee pain   EXAM: LEFT KNEE - 1-2 VIEW  COMPARISON:  October 2022  FINDINGS: Nonunion of left patellar fracture. No new acute fracture. Degenerative changes of the knee demonstrated by subchondral lucencies of the distal femur. Visualized soft tissues are unremarkable.  IMPRESSION: Nonunion of left patellar fracture.   COGNITION: Overall cognitive status: Within functional limits for tasks assessed   SENSATION: WFL   POSTURE: No Significant postural limitations.  Pt is however unable to rotate her neck or move her hips as much due to the cancer in the pt.    CERVICAL ROM:   Active ROM A/PROM (deg) eval AROM 4/17   Flexion 36   Extension 16   Right lateral flexion 11   Left lateral flexion 10   Right rotation 16 28  Left rotation 26 39    (Blank rows = not tested)   LOWER EXTREMITY ROM:     Active  Right Eval Left Eval  Knee flexion 85 deg deferred  Knee extension 17 deg lacking  deferred    LOWER EXTREMITY MMT:    MMT Right Eval Left Eval  Hip flexion 4+ 4  Knee flexion 4+ 4  Knee extension 4+ 4-    UPPER EXTREMITY ROM:  Active ROM Right eval Left eval R 4/17 L 4/17   Shoulder flexion 62 deg 99 deg 76 105   Shoulder scaption 60 deg 56 deg 76  91    UPPER EXTREMITY MMT:  MMT Right eval Left eval  Shoulder  flexion 3+ with pain 4  Shoulder abduction 3+ with pain 4  Grip strength 28# 38#     BED MOBILITY:  Supine to sit Modified independence, extra time necessary.   TRANSFERS: Assistive  device utilized: None  Sit to stand: Modified independence Stand to sit: Modified independence Chair to chair: Modified independence     GAIT: Gait pattern: step through pattern, decreased step length- Right, decreased stance time- Left, and decreased stride length Distance walked: 40' Assistive device utilized: None Level of assistance: Modified independence Comments: Pt ambulates with significant stiffness due to the hinged knee brace on the L LE as well as the stiffness that she has in her joints from the cancer.  FUNCTIONAL TESTS:  5 times sit to stand: 23.43 sec from elevated mat table; 4/17: 16.53sec from slightly elevated mat table   Timed up and go (TUG): 23.33 sec; 4/17: 13.61 average of 2 trials  2 minute walk test: 211 10 meter walk test: 18.33 sec Berg Balance Scale: 43/56; 4/17: 48/56     PATIENT SURVEYS:  FOTO 46/59  TODAY'S TREATMENT: DATE: 07/09/22     Gait with no AD x  with no AD 3 x 164ft. Min cues for improved core activation to prevent excessive trunkal sway with BLE advancement.  Seated therex:  AROM LAQ 2 x 12 Hip abduction 2x 15 RTB Hip flexion 2x 15 RTB HS curl 2x  12 RTB Hip extension 2x 15 RTB  Sit<>stand from EOB with UE to push from thighs 2 x 7  Therapeutic rest break between bouts due to BLE fatigue.    PATIENT EDUCATION: Education details: Pt educated throughout session about proper posture and technique with exercises. Improved exercise technique, movement at target joints, use of target muscles after min to mod verbal, visual, tactile cues. Person educated: Patient and Nurse, mental health method: Explanation Education comprehension: verbalized understanding  HOME EXERCISE PROGRAM:  Access Code: [email or web address] URL:  [email or web address] Date: 05/19/2022 Prepared by: Grier Rocher  Exercises - Seated Hip Abduction with Resistance  - 1 x daily - 7 x weekly - 3 sets - 10 reps - Standing Hip Extension Kicks  - 1 x daily - 7 x weekly - 3 sets - 10 reps - Standing Hip Abduction Kicks  - 1 x daily - 7 x weekly - 3 sets - 10 reps  Access Code: [email or web address] URL: [email or web address] Date: 05/14/2022 Prepared by: Tomasa Hose  Exercises - Isometric Shoulder Flexion at Wall  - 1 x daily - 7 x weekly - 3 sets - 10 reps - Isometric Shoulder Extension at Wall  - 1 x daily - 7 x weekly - 3 sets - 10 reps - Isometric Shoulder Abduction at Wall  - 1 x daily - 7 x weekly - 3 sets - 10 reps - Isometric Shoulder Adduction  - 1 x daily - 7 x weekly - 3 sets - 10 reps   GOALS:  Goals reviewed with patient? Yes  SHORT TERM GOALS: Target date: 06/03/2022  Pt will be independent with HEP in order to demonstrate increased ability to perform tasks related to occupation/hobbies. Baseline: Not given HEP at initial evaluation. Goal status: MET   LONG TERM GOALS: Target date: 07/29/2022  Patient will demonstrate improved function as evidenced by a score of 59 on FOTO measure for full participation in activities at home and in the community. Baseline: Evaluation: 46, 4/17: 68 Goal status: MET  2.  Patient to demonstrate increased cervical rotation ROM to be 65 deg in order to return to PLOF and improving safety while driving. Baseline: 16/26 deg 4/17 28/39 DEG Goal status: IN PROGRESS  3.  Pt to improve B shoulder ROM to be WFL (Flexion: 120 deg; Abduction:  130 deg) in order to improve ability to complete tasks Baseline: R/L Shoulder Flex: 62/99; R/L Shoulder Scaption: 60/56 4/17: shoulder flexion 76/105 shoulder scaption 76/91 Goal status: IN PROGRESS  4.  Pt to improve overall strength of the LE's in order to ambulate for longer distances and be able to tolerate surgery to repair the L  knee. Baseline: Global 4/5 strength in the R LE, L LE MMT deferred due to the patella being broken at this time. 4/17 improved strength on the RLE and LLE with knee extension 4-/5, hip flexion and knee flexion 4/5.   Goal status: IN PROGRESS   5.  Patient (> 32 years old) will complete five times sit to stand test in < 15 seconds indicating an increased LE strength and improved balance. Baseline: 23.43 sec 4/17: 16.53sec from slightly elevatedd mat table  Goal status: in progress.    6.  Patient will reduce timed up and go to <11 seconds to reduce fall risk and demonstrate improved transfer/gait ability. Baseline: 23.33 sec 4/17: 13.61 average of 2 trials  Goal status:  IN PROGRESS  7.  Pt will improve BERG by at least 3 points in order to demonstrate clinically significant improvement in balance.  Baseline: 43 4/17: 48/56 Goal status: MET     ASSESSMENT:  CLINICAL IMPRESSION:  Pt put forth good effort in PT treatment. PT treatment focused on BLE strengthening and endurance. Noted to have improved ease of standing from only slightly elevated seat. Continued to have pain in R shoulder limiting focus on UE and cervical ROM and strength. Pt will continue to benefit from skilled therapy to address remaining deficits in order to improve overall QoL and return to PLOF.       OBJECTIVE IMPAIRMENTS: Abnormal gait, decreased activity tolerance, decreased balance, decreased endurance, decreased knowledge of use of DME, decreased mobility, difficulty walking, decreased ROM, decreased strength, hypomobility, impaired flexibility, impaired UE functional use, and pain  ACTIVITY LIMITATIONS: carrying, lifting, bending, sitting, standing, squatting, stairs, transfers, bed mobility, bathing, toileting, dressing, reach over head, hygiene/grooming, and locomotion level  PARTICIPATION LIMITATIONS: meal prep, cleaning, laundry, driving, shopping, community activity, occupation, and yard work  PERSONAL  FACTORS: Time since onset of injury/illness/exacerbation and 3+ comorbidities: Hx of breast cancer, bone cancer, thyroid disease  are also affecting patient's functional outcome.   REHAB POTENTIAL: Fair due to multiple joints needing to be targeted and pt's current bone cancer that is causing her joints to stiffen and become more rigid.    CLINICAL DECISION MAKING: Evolving/moderate complexity  EVALUATION COMPLEXITY: Moderate  PLAN:  PT FREQUENCY: 2x/week  PT DURATION: 12 weeks  PLANNED INTERVENTIONS: Therapeutic exercises, Therapeutic activity, Neuromuscular re-education, Balance training, Gait training, Patient/Family education, Self Care, Joint mobilization, Stair training, Vestibular training, Canalith repositioning, DME instructions, Aquatic Therapy, Dry Needling, Spinal mobilization, Cryotherapy, Moist heat, Manual therapy, and Re-evaluation  PLAN FOR NEXT SESSION:    Allow pt identifies as most pressing issue (shoulder vs neck, vs LE) and treat with strengthening or stretching program to assist with ROM and pain modulation.    Grier Rocher PT, DPT  Physical Therapist - [facility]  07/09/22, 5:02 PM

## 2022-07-10 ENCOUNTER — Ambulatory Visit (INDEPENDENT_AMBULATORY_CARE_PROVIDER_SITE_OTHER): Payer: Commercial Managed Care - PPO | Admitting: Podiatry

## 2022-07-10 ENCOUNTER — Encounter: Payer: Self-pay | Admitting: Hematology and Oncology

## 2022-07-10 DIAGNOSIS — B351 Tinea unguium: Secondary | ICD-10-CM | POA: Diagnosis not present

## 2022-07-10 DIAGNOSIS — M79674 Pain in right toe(s): Secondary | ICD-10-CM

## 2022-07-10 DIAGNOSIS — M79675 Pain in left toe(s): Secondary | ICD-10-CM

## 2022-07-10 NOTE — Progress Notes (Signed)
  Subjective:  Patient ID: Kathryn Lucas, female    DOB: [birth year],  MRN: [id number]  No chief complaint on file.  62 y.o. female returns for the above complaint.  Patient presents with thickened elongated dystrophic mycotic toenails x 10 mild pain on palpation worse with ambulation or shoe pressure she would like me to debride down she is not able to do it herself.  Objective:  There were no vitals filed for this visit. Podiatric Exam: Vascular: dorsalis pedis and posterior tibial pulses are palpable bilateral. Capillary return is immediate. Temperature gradient is WNL. Skin turgor WNL  Sensorium: Normal Semmes Weinstein monofilament test. Normal tactile sensation bilaterally. Nail Exam: Pt has thick disfigured discolored nails with subungual debris noted bilateral entire nail hallux through fifth toenails.  Pain on palpation to the nails. Ulcer Exam: There is no evidence of ulcer or pre-ulcerative changes or infection. Orthopedic Exam: Muscle tone and strength are WNL. No limitations in general ROM. No crepitus or effusions noted.  Skin: No Porokeratosis. No infection or ulcers    Assessment & Plan:   1. Pain due to onychomycosis of toenails of both feet     Patient was evaluated and treated and all questions answered.  Onychomycosis with pain  -Nails palliatively debrided as below. -Educated on self-care  Procedure: Nail Debridement Rationale: pain  Type of Debridement: manual, sharp debridement. Instrumentation: Nail nipper, rotary burr. Number of Nails: 10  Procedures and Treatment: Consent by patient was obtained for treatment procedures. The patient understood the discussion of treatment and procedures well. All questions were answered thoroughly reviewed. Debridement of mycotic and hypertrophic toenails, 1 through 5 bilateral and clearing of subungual debris. No ulceration, no infection noted.  Return Visit-Office Procedure: Patient instructed to return to the office for  a follow up visit 3 months for continued evaluation and treatment.  Nicholes Rough, DPM    No follow-ups on file.

## 2022-07-13 ENCOUNTER — Ambulatory Visit: Payer: Commercial Managed Care - PPO | Admitting: Physical Therapy

## 2022-07-13 ENCOUNTER — Inpatient Hospital Stay: Payer: Commercial Managed Care - PPO

## 2022-07-13 DIAGNOSIS — M25612 Stiffness of left shoulder, not elsewhere classified: Secondary | ICD-10-CM

## 2022-07-13 DIAGNOSIS — M25562 Pain in left knee: Secondary | ICD-10-CM | POA: Diagnosis not present

## 2022-07-13 DIAGNOSIS — M436 Torticollis: Secondary | ICD-10-CM

## 2022-07-13 DIAGNOSIS — M6281 Muscle weakness (generalized): Secondary | ICD-10-CM

## 2022-07-13 DIAGNOSIS — R262 Difficulty in walking, not elsewhere classified: Secondary | ICD-10-CM

## 2022-07-13 DIAGNOSIS — R29898 Other symptoms and signs involving the musculoskeletal system: Secondary | ICD-10-CM

## 2022-07-13 DIAGNOSIS — M542 Cervicalgia: Secondary | ICD-10-CM

## 2022-07-13 DIAGNOSIS — M25611 Stiffness of right shoulder, not elsewhere classified: Secondary | ICD-10-CM

## 2022-07-13 NOTE — Therapy (Signed)
OUTPATIENT PHYSICAL THERAPY NEURO TREATMENT    Patient Name: Kathryn Lucas MRN: [id number] DOB:05/23/1960, 61 y.o., female Today's Date: 07/13/2022   PCP: Ardean Larsen, MD REFERRING PROVIDER: [name], MD  END OF SESSION:  PT End of Session - 07/13/22 1309     Visit Number 13    Number of Visits 24    Date for PT Re-Evaluation 07/29/22    Authorization Type [employer]    Progress Note Due on Visit 20    PT Start Time 1302    PT Stop Time 1345    PT Time Calculation (min) 43 min    Equipment Utilized During Treatment Gait belt    Activity Tolerance Patient tolerated treatment well;Patient limited by pain    Behavior During Therapy WFL for tasks assessed/performed                 Past Medical History:  Diagnosis Date   Breast cancer (HCC)    left breast cancer   Cancer (HCC) 09/2017   left breast cancer   Family history of breast cancer    Hypothyroidism    Personal history of radiation therapy 2019   Thyroid disease    Past Surgical History:  Procedure Laterality Date   BREAST BIOPSY Left 2018   BREAST BIOPSY Left 2019   BREAST RECONSTRUCTION WITH PLACEMENT OF TISSUE EXPANDER AND ALLODERM Left 10/26/2017   Procedure: LEFT BREAST RECONSTRUCTION WITH PLACEMENT OF TISSUE EXPANDER AND ALLODERM;  Surgeon: Glenna Fellows, MD;  Location: [facility];  Service: Plastics;  Laterality: Left;   MASTECTOMY Left 2019   MASTECTOMY WITH RADIOACTIVE SEED GUIDED EXCISION AND AXILLARY SENTINEL LYMPH NODE BIOPSY Left 10/26/2017   Procedure: LEFT MASTECTOMY WITH SEED TARGETED  LEFT AXILLARY LYMPH NODE EXCISION AND LEFT SENTINEL LYMPH NODE BIOPSY;  Surgeon: Almond Lint, MD;  Location: [facility];  Service: General;  Laterality: Left;   TONSILLECTOMY     WISDOM TOOTH EXTRACTION     Patient Active Problem List   Diagnosis Date Noted   Metastatic malignant neoplasm (HCC) 12/19/2021   Family history of breast cancer    History of  therapeutic radiation 04/27/2018   Breast cancer, left breast (HCC) 10/26/2017   Malignant neoplasm of lower-outer quadrant of left breast of female, estrogen receptor positive (HCC) 07/15/2016   Plantar fasciitis 07/30/2015   Metatarsalgia 10/18/2014    ONSET DATE: September 2023  REFERRING DIAG: C79.51 (ICD-10-CM) - Malignant neoplasm metastatic to bone (HCC)   THERAPY DIAG:  Difficulty in walking, not elsewhere classified  Muscle weakness (generalized)  Acute pain of left knee  Leg weakness, bilateral  Stiffness of cervical spine  Decreased range of motion of left shoulder  Decreased range of motion of right shoulder  Painful cervical ROM  Rationale for Evaluation and Treatment: Rehabilitation  SUBJECTIVE:  SUBJECTIVE STATEMENT:  Pt reports that she is doing well. Went to [employer] over the weekend and cervical region is now sore and tight. Pt does not rate.    Pt accompanied by: self  PERTINENT HISTORY:   Pt is 62 y.o. female that is seeking therapy for weakness of the R LE.  Pt tripped over the threshold of her door back in September of 2023, and had a transverse fracture of the L patella when she fell on it.  She was recently diagnosed with bone cancer back in October of 2023.  Pt notes that she has more stiffness in the neck and pelvis region because the cancer is located in those locations.  Pt is currently utilizing a hinged knee brace that she received from a friend for support of the L knee.  Secure Chat with referring provider [name], MD, and asked for clarification on any precautions were present due to the pt's cancer diagnosis  Pt is cleared to have modalities performed including dry needling and heat applied per MD.   PAIN:  Are you having pain?  No  PRECAUTIONS: Other: Cancer  WEIGHT BEARING RESTRICTIONS: Pt denies having any weightbearing restrictions on the knee  FALLS: Has patient fallen in last 6 months? Yes. Number of falls 1  LIVING ENVIRONMENT: Lives with: lives alone Lives in: House/apartment Stairs: No Has following equipment at home: Dan Humphreys - 4 wheeled, shower chair, and elevated toilet with bars on the side.  Pt is looking at putting in grab bars in the shower, but has assistant that helps with showers.  PLOF: Independent  PATIENT GOALS: To get stronger, be able to walk better.    OBJECTIVE:   DIAGNOSTIC FINDINGS:   CLINICAL DATA:  Left knee pain   EXAM: LEFT KNEE - 1-2 VIEW  COMPARISON:  October 2022  FINDINGS: Nonunion of left patellar fracture. No new acute fracture. Degenerative changes of the knee demonstrated by subchondral lucencies of the distal femur. Visualized soft tissues are unremarkable.  IMPRESSION: Nonunion of left patellar fracture.   COGNITION: Overall cognitive status: Within functional limits for tasks assessed   SENSATION: WFL   POSTURE: No Significant postural limitations.  Pt is however unable to rotate her neck or move her hips as much due to the cancer in the pt.    CERVICAL ROM:   Active ROM A/PROM (deg) eval AROM 4/17   Flexion 36   Extension 16   Right lateral flexion 11   Left lateral flexion 10   Right rotation 16 28  Left rotation 26 39    (Blank rows = not tested)   LOWER EXTREMITY ROM:     Active  Right Eval Left Eval  Knee flexion 85 deg deferred  Knee extension 17 deg lacking  deferred    LOWER EXTREMITY MMT:    MMT Right Eval Left Eval  Hip flexion 4+ 4  Knee flexion 4+ 4  Knee extension 4+ 4-    UPPER EXTREMITY ROM:  Active ROM Right eval Left eval R 4/17 L 4/17   Shoulder flexion 62 deg 99 deg 76 105   Shoulder scaption 60 deg 56 deg 76  91    UPPER EXTREMITY MMT:  MMT Right eval Left eval  Shoulder flexion 3+ with  pain 4  Shoulder abduction 3+ with pain 4  Grip strength 28# 38#     BED MOBILITY:  Supine to sit Modified independence, extra time necessary.   TRANSFERS: Assistive device utilized: None  Sit  to stand: Modified independence Stand to sit: Modified independence Chair to chair: Modified independence     GAIT: Gait pattern: step through pattern, decreased step length- Right, decreased stance time- Left, and decreased stride length Distance walked: 40' Assistive device utilized: None Level of assistance: Modified independence Comments: Pt ambulates with significant stiffness due to the hinged knee brace on the L LE as well as the stiffness that she has in her joints from the cancer.  FUNCTIONAL TESTS:  5 times sit to stand: 23.43 sec from elevated mat table; 4/17: 16.53sec from slightly elevated mat table   Timed up and go (TUG): 23.33 sec; 4/17: 13.61 average of 2 trials  2 minute walk test: 211 10 meter walk test: 18.33 sec Berg Balance Scale: 43/56; 4/17: 48/56     PATIENT SURVEYS:  FOTO 46/59  TODAY'S TREATMENT: DATE: 07/13/22     Pt reporting increased stiffness in cervical stiffness.  UBE, Scifit 2 min forward, 1.5 min reverse.   Supine Manual therapy  Bil suboccipital release 2 x 1 min  L and R UT and spinalis/splenis STM with trigger point release.  Cervical rotation with STM to contralateral cervical musculature 2 x 20 sec bil and over pressure from PT.  Bil shoulder ROM into flexion with overpressure 2 x 20 sec bil.   Supine therex:  Chin tucks x 12 with 2 sec hold  SAQ x 10 3# ankle weight RLE AROM on the LLE SLR x 8; 3# ankle weight RLE AROM on the LLE Hip abduction  x 12 Hip adduction x 12  Bridge x 6  Ankle pumps. X 12 Moist heat pack applied to UT and cervical region while performing supine BLE therex.   Sit<>supine with supervision assist from PT for safety.    PATIENT EDUCATION: Education details: Pt educated throughout session about  proper posture and technique with exercises. Improved exercise technique, movement at target joints, use of target muscles after min to mod verbal, visual, tactile cues. Person educated: Patient and Nurse, mental health method: Explanation Education comprehension: verbalized understanding  HOME EXERCISE PROGRAM:  Access Code: [email or web address] URL: [email or web address] Date: 05/19/2022 Prepared by: Grier Rocher  Exercises - Seated Hip Abduction with Resistance  - 1 x daily - 7 x weekly - 3 sets - 10 reps - Standing Hip Extension Kicks  - 1 x daily - 7 x weekly - 3 sets - 10 reps - Standing Hip Abduction Kicks  - 1 x daily - 7 x weekly - 3 sets - 10 reps  Access Code: [email or web address] URL: [email or web address] Date: 05/14/2022 Prepared by: Tomasa Hose  Exercises - Isometric Shoulder Flexion at Wall  - 1 x daily - 7 x weekly - 3 sets - 10 reps - Isometric Shoulder Extension at Wall  - 1 x daily - 7 x weekly - 3 sets - 10 reps - Isometric Shoulder Abduction at Wall  - 1 x daily - 7 x weekly - 3 sets - 10 reps - Isometric Shoulder Adduction  - 1 x daily - 7 x weekly - 3 sets - 10 reps   GOALS:  Goals reviewed with patient? Yes  SHORT TERM GOALS: Target date: 06/03/2022  Pt will be independent with HEP in order to demonstrate increased ability to perform tasks related to occupation/hobbies. Baseline: Not given HEP at initial evaluation. Goal status: MET   LONG TERM GOALS: Target date: 07/29/2022  Patient will demonstrate improved function as evidenced by a score of 59 on FOTO measure for full participation  in activities at home and in the community. Baseline: Evaluation: 46, 4/17: 68 Goal status: MET  2.  Patient to demonstrate increased cervical rotation ROM to be 65 deg in order to return to PLOF and improving safety while driving. Baseline: 16/26 deg 4/17 28/39 DEG Goal status: IN PROGRESS  3.  Pt to improve B shoulder ROM to be WFL (Flexion: 120 deg;  Abduction: 130 deg) in order to improve ability to complete tasks Baseline: R/L Shoulder Flex: 62/99; R/L Shoulder Scaption: 60/56 4/17: shoulder flexion 76/105 shoulder scaption 76/91 Goal status: IN PROGRESS  4.  Pt to improve overall strength of the LE's in order to ambulate for longer distances and be able to tolerate surgery to repair the L knee. Baseline: Global 4/5 strength in the R LE, L LE MMT deferred due to the patella being broken at this time. 4/17 improved strength on the RLE and LLE with knee extension 4-/5, hip flexion and knee flexion 4/5.   Goal status: IN PROGRESS   5.  Patient (> 81 years old) will complete five times sit to stand test in < 15 seconds indicating an increased LE strength and improved balance. Baseline: 23.43 sec 4/17: 16.53sec from slightly elevatedd mat table  Goal status: in progress.    6.  Patient will reduce timed up and go to <11 seconds to reduce fall risk and demonstrate improved transfer/gait ability. Baseline: 23.33 sec 4/17: 13.61 average of 2 trials  Goal status:  IN PROGRESS  7.  Pt will improve BERG by at least 3 points in order to demonstrate clinically significant improvement in balance.  Baseline: 43 4/17: 48/56 Goal status: MET     ASSESSMENT:  CLINICAL IMPRESSION:  Pt put forth good effort in PT treatment. PT treatment focused on pain management and ROM of cervical spine as well as BLE strengthening. Pt reports decrease pain and increased ROM in the cervical spine following treatment. Mild Fatigue in BLE following therex, but continued to put forth good effort throughout. Pt will continue to benefit from skilled therapy to address remaining deficits in order to improve overall QoL and return to PLOF.       OBJECTIVE IMPAIRMENTS: Abnormal gait, decreased activity tolerance, decreased balance, decreased endurance, decreased knowledge of use of DME, decreased mobility, difficulty walking, decreased ROM, decreased strength,  hypomobility, impaired flexibility, impaired UE functional use, and pain  ACTIVITY LIMITATIONS: carrying, lifting, bending, sitting, standing, squatting, stairs, transfers, bed mobility, bathing, toileting, dressing, reach over head, hygiene/grooming, and locomotion level  PARTICIPATION LIMITATIONS: meal prep, cleaning, laundry, driving, shopping, community activity, occupation, and yard work  PERSONAL FACTORS: Time since onset of injury/illness/exacerbation and 3+ comorbidities: Hx of breast cancer, bone cancer, thyroid disease  are also affecting patient's functional outcome.   REHAB POTENTIAL: Fair due to multiple joints needing to be targeted and pt's current bone cancer that is causing her joints to stiffen and become more rigid.    CLINICAL DECISION MAKING: Evolving/moderate complexity  EVALUATION COMPLEXITY: Moderate  PLAN:  PT FREQUENCY: 2x/week  PT DURATION: 12 weeks  PLANNED INTERVENTIONS: Therapeutic exercises, Therapeutic activity, Neuromuscular re-education, Balance training, Gait training, Patient/Family education, Self Care, Joint mobilization, Stair training, Vestibular training, Canalith repositioning, DME instructions, Aquatic Therapy, Dry Needling, Spinal mobilization, Cryotherapy, Moist heat, Manual therapy, and Re-evaluation  PLAN FOR NEXT SESSION:    Allow pt identifies as most pressing issue (shoulder vs neck, vs LE) and treat with strengthening or stretching program to assist with ROM and pain modulation.  Grier Rocher PT, DPT  Physical Therapist - [facility]  07/13/22, 2:33 PM

## 2022-07-13 NOTE — Therapy (Deleted)
OUTPATIENT PHYSICAL THERAPY NEURO TREATMENT    Patient Name: Kathryn Lucas MRN: [id number] DOB:14-Nov-1960, 62 y.o., female Today's Date: 07/13/2022   PCP: Ardean Larsen, MD REFERRING PROVIDER: [name], MD  END OF SESSION:        Past Medical History:  Diagnosis Date   Breast cancer [facility])    left breast cancer   Cancer (HCC) 09/2017   left breast cancer   Family history of breast cancer    Hypothyroidism    Personal history of radiation therapy 2019   Thyroid disease    Past Surgical History:  Procedure Laterality Date   BREAST BIOPSY Left 2018   BREAST BIOPSY Left 2019   BREAST RECONSTRUCTION WITH PLACEMENT OF TISSUE EXPANDER AND ALLODERM Left 10/26/2017   Procedure: LEFT BREAST RECONSTRUCTION WITH PLACEMENT OF TISSUE EXPANDER AND ALLODERM;  Surgeon: Glenna Fellows, MD;  Location: [facility];  Service: Plastics;  Laterality: Left;   MASTECTOMY Left 2019   MASTECTOMY WITH RADIOACTIVE SEED GUIDED EXCISION AND AXILLARY SENTINEL LYMPH NODE BIOPSY Left 10/26/2017   Procedure: LEFT MASTECTOMY WITH SEED TARGETED  LEFT AXILLARY LYMPH NODE EXCISION AND LEFT SENTINEL LYMPH NODE BIOPSY;  Surgeon: Almond Lint, MD;  Location: [facility];  Service: General;  Laterality: Left;   TONSILLECTOMY     WISDOM TOOTH EXTRACTION     Patient Active Problem List   Diagnosis Date Noted   Metastatic malignant neoplasm (HCC) 12/19/2021   Family history of breast cancer    History of therapeutic radiation 04/27/2018   Breast cancer, left breast (HCC) 10/26/2017   Malignant neoplasm of lower-outer quadrant of left breast of female, estrogen receptor positive (HCC) 07/15/2016   Plantar fasciitis 07/30/2015   Metatarsalgia 10/18/2014    ONSET DATE: September 2023  REFERRING DIAG: C79.51 (ICD-10-CM) - Malignant neoplasm metastatic to bone (HCC)   THERAPY DIAG:  Difficulty in walking, not elsewhere classified  Muscle weakness (generalized)  Acute  pain of left knee  Leg weakness, bilateral  Rationale for Evaluation and Treatment: Rehabilitation  SUBJECTIVE:                                                                                                                                                                                             SUBJECTIVE STATEMENT:  Pt reports that she is doing well. Pain in the R shoulder from pushing off low seat height over the weekend. Requesting to focus PT treatment on BLE strength.    Pt accompanied by:  caregiver, Corrie Dandy  PERTINENT HISTORY:   Pt is 62 y.o. female that is seeking therapy for weakness of the R LE.  Pt tripped over the threshold of her door back in September of 2023, and had a transverse fracture of the L patella when she fell on it.  She was recently diagnosed with bone cancer back in October of 2023.  Pt notes that she has more stiffness in the neck and pelvis region because the cancer is located in those locations.  Pt is currently utilizing a hinged knee brace that she received from a friend for support of the L knee.  Secure Chat with referring provider [name], MD, and asked for clarification on any precautions were present due to the pt's cancer diagnosis  Pt is cleared to have modalities performed including dry needling and heat applied per MD.   PAIN:  Are you having pain? No  PRECAUTIONS: Other: Cancer  WEIGHT BEARING RESTRICTIONS: Pt denies having any weightbearing restrictions on the knee  FALLS: Has patient fallen in last 6 months? Yes. Number of falls 1  LIVING ENVIRONMENT: Lives with: lives alone Lives in: House/apartment Stairs: No Has following equipment at home: Dan Humphreys - 4 wheeled, shower chair, and elevated toilet with bars on the side.  Pt is looking at putting in grab bars in the shower, but has assistant that helps with showers.  PLOF: Independent  PATIENT GOALS: To get stronger, be able to walk better.    OBJECTIVE:   DIAGNOSTIC FINDINGS:    CLINICAL DATA:  Left knee pain   EXAM: LEFT KNEE - 1-2 VIEW  COMPARISON:  October 2022  FINDINGS: Nonunion of left patellar fracture. No new acute fracture. Degenerative changes of the knee demonstrated by subchondral lucencies of the distal femur. Visualized soft tissues are unremarkable.  IMPRESSION: Nonunion of left patellar fracture.   COGNITION: Overall cognitive status: Within functional limits for tasks assessed   SENSATION: WFL   POSTURE: No Significant postural limitations.  Pt is however unable to rotate her neck or move her hips as much due to the cancer in the pt.    CERVICAL ROM:   Active ROM A/PROM (deg) eval AROM 4/17   Flexion 36   Extension 16   Right lateral flexion 11   Left lateral flexion 10   Right rotation 16 28  Left rotation 26 39    (Blank rows = not tested)   LOWER EXTREMITY ROM:     Active  Right Eval Left Eval  Knee flexion 85 deg deferred  Knee extension 17 deg lacking  deferred    LOWER EXTREMITY MMT:    MMT Right Eval Left Eval  Hip flexion 4+ 4  Knee flexion 4+ 4  Knee extension 4+ 4-    UPPER EXTREMITY ROM:  Active ROM Right eval Left eval R 4/17 L 4/17   Shoulder flexion 62 deg 99 deg 76 105   Shoulder scaption 60 deg 56 deg 76  91    UPPER EXTREMITY MMT:  MMT Right eval Left eval  Shoulder flexion 3+ with pain 4  Shoulder abduction 3+ with pain 4  Grip strength 28# 38#     BED MOBILITY:  Supine to sit Modified independence, extra time necessary.   TRANSFERS: Assistive device utilized: None  Sit to stand: Modified independence Stand to sit: Modified independence Chair to chair: Modified independence     GAIT: Gait pattern: step through pattern, decreased step length- Right, decreased stance time- Left, and decreased stride length Distance walked: 40' Assistive device utilized: None Level of assistance: Modified independence Comments: Pt ambulates with significant stiffness due to  the  hinged knee brace on the L LE as well as the stiffness that she has in her joints from the cancer.  FUNCTIONAL TESTS:  5 times sit to stand: 23.43 sec from elevated mat table; 4/17: 16.53sec from slightly elevated mat table   Timed up and go (TUG): 23.33 sec; 4/17: 13.61 average of 2 trials  2 minute walk test: 211 10 meter walk test: 18.33 sec Berg Balance Scale: 43/56; 4/17: 48/56     PATIENT SURVEYS:  FOTO 46/59  TODAY'S TREATMENT: DATE: 07/13/22     Gait with no AD x  with no AD 3 x 168ft. Min cues for improved core activation to prevent excessive trunkal sway with BLE advancement.  Seated therex:  AROM LAQ 2 x 12 Hip abduction 2x 15 RTB Hip flexion 2x 15 RTB HS curl 2x  12 RTB Hip extension 2x 15 RTB  Sit<>stand from EOB with UE to push from thighs 2 x 7  Therapeutic rest break between bouts due to BLE fatigue.    UE focussed past treatment ***  Sci fit UBE. 3 min forward 2.5 min reverse with 2 min rest break between bouts  Squat with BUE supported on rail with cues AAROM into shoulder flexion with squat x 10   Pt reports increased cervical tightness over the last few days.  PT performed STM for UT with trigger point release x 5 min then performed  with cervical rotation x 1 min bil and with lateral flexion x 1 min bil.   Performed mid row 2 x 10 with RTB.  Shoulder flexion  with RTB 2x 10 bil   Sit<>stand x 5 with 1 UE pushing from seat with 1 cushion in place.   PATIENT EDUCATION: Education details: Pt educated throughout session about proper posture and technique with exercises. Improved exercise technique, movement at target joints, use of target muscles after min to mod verbal, visual, tactile cues. Person educated: Patient and Nurse, mental health method: Explanation Education comprehension: verbalized understanding  HOME EXERCISE PROGRAM:  Access Code: [email or web address] URL: [email or web address] Date: 05/19/2022 Prepared by: Grier Rocher  Exercises - Seated Hip Abduction with Resistance  - 1 x daily - 7 x weekly - 3 sets - 10 reps - Standing Hip Extension Kicks  - 1 x daily - 7 x weekly - 3 sets - 10 reps - Standing Hip Abduction Kicks  - 1 x daily - 7 x weekly - 3 sets - 10 reps  Access Code: [email or web address] URL: [email or web address] Date: 05/14/2022 Prepared by: Tomasa Hose  Exercises - Isometric Shoulder Flexion at Wall  - 1 x daily - 7 x weekly - 3 sets - 10 reps - Isometric Shoulder Extension at Wall  - 1 x daily - 7 x weekly - 3 sets - 10 reps - Isometric Shoulder Abduction at Wall  - 1 x daily - 7 x weekly - 3 sets - 10 reps - Isometric Shoulder Adduction  - 1 x daily - 7 x weekly - 3 sets - 10 reps   GOALS:  Goals reviewed with patient? Yes  SHORT TERM GOALS: Target date: 06/03/2022  Pt will be independent with HEP in order to demonstrate increased ability to perform tasks related to occupation/hobbies. Baseline: Not given HEP at initial evaluation. Goal status: MET   LONG TERM GOALS: Target date: 07/29/2022  Patient will demonstrate improved function as evidenced by a score of 59 on FOTO measure for full participation in activities at home and in the community. Baseline: Evaluation:  46, 4/17: 68 Goal status: MET  2.  Patient to demonstrate increased cervical rotation ROM to be 65 deg in order to return to PLOF and improving safety while driving. Baseline: 16/26 deg 4/17 28/39 DEG Goal status: IN PROGRESS  3.  Pt to improve B shoulder ROM to be WFL (Flexion: 120 deg; Abduction: 130 deg) in order to improve ability to complete tasks Baseline: R/L Shoulder Flex: 62/99; R/L Shoulder Scaption: 60/56 4/17: shoulder flexion 76/105 shoulder scaption 76/91 Goal status: IN PROGRESS  4.  Pt to improve overall strength of the LE's in order to ambulate for longer distances and be able to tolerate surgery to repair the L knee. Baseline: Global 4/5 strength in the R LE, L LE MMT deferred due to  the patella being broken at this time. 4/17 improved strength on the RLE and LLE with knee extension 4-/5, hip flexion and knee flexion 4/5.   Goal status: IN PROGRESS   5.  Patient (> 50 years old) will complete five times sit to stand test in < 15 seconds indicating an increased LE strength and improved balance. Baseline: 23.43 sec 4/17: 16.53sec from slightly elevatedd mat table  Goal status: in progress.    6.  Patient will reduce timed up and go to <11 seconds to reduce fall risk and demonstrate improved transfer/gait ability. Baseline: 23.33 sec 4/17: 13.61 average of 2 trials  Goal status:  IN PROGRESS  7.  Pt will improve BERG by at least 3 points in order to demonstrate clinically significant improvement in balance.  Baseline: 43 4/17: 48/56 Goal status: MET     ASSESSMENT:  CLINICAL IMPRESSION:  Pt put forth good effort in PT treatment. PT treatment focused on BLE strengthening and endurance. Noted to have improved ease of standing from only slightly elevated seat. Continued to have pain in R shoulder limiting focus on UE and cervical ROM and strength. Pt will continue to benefit from skilled therapy to address remaining deficits in order to improve overall QoL and return to PLOF.       OBJECTIVE IMPAIRMENTS: Abnormal gait, decreased activity tolerance, decreased balance, decreased endurance, decreased knowledge of use of DME, decreased mobility, difficulty walking, decreased ROM, decreased strength, hypomobility, impaired flexibility, impaired UE functional use, and pain  ACTIVITY LIMITATIONS: carrying, lifting, bending, sitting, standing, squatting, stairs, transfers, bed mobility, bathing, toileting, dressing, reach over head, hygiene/grooming, and locomotion level  PARTICIPATION LIMITATIONS: meal prep, cleaning, laundry, driving, shopping, community activity, occupation, and yard work  PERSONAL FACTORS: Time since onset of injury/illness/exacerbation and 3+  comorbidities: Hx of breast cancer, bone cancer, thyroid disease  are also affecting patient's functional outcome.   REHAB POTENTIAL: Fair due to multiple joints needing to be targeted and pt's current bone cancer that is causing her joints to stiffen and become more rigid.    CLINICAL DECISION MAKING: Evolving/moderate complexity  EVALUATION COMPLEXITY: Moderate  PLAN:  PT FREQUENCY: 2x/week  PT DURATION: 12 weeks  PLANNED INTERVENTIONS: Therapeutic exercises, Therapeutic activity, Neuromuscular re-education, Balance training, Gait training, Patient/Family education, Self Care, Joint mobilization, Stair training, Vestibular training, Canalith repositioning, DME instructions, Aquatic Therapy, Dry Needling, Spinal mobilization, Cryotherapy, Moist heat, Manual therapy, and Re-evaluation  PLAN FOR NEXT SESSION:    Allow pt identifies as most pressing issue (shoulder vs neck, vs LE) and treat with strengthening or stretching program to assist with ROM and pain modulation.    Norman Herrlich PT ,DPT Physical Therapist- [facility]  07/13/22, 8:15  AM

## 2022-07-15 ENCOUNTER — Ambulatory Visit: Payer: Commercial Managed Care - PPO | Attending: Hematology and Oncology | Admitting: Physical Therapy

## 2022-07-15 DIAGNOSIS — M25612 Stiffness of left shoulder, not elsewhere classified: Secondary | ICD-10-CM | POA: Diagnosis present

## 2022-07-15 DIAGNOSIS — M6281 Muscle weakness (generalized): Secondary | ICD-10-CM

## 2022-07-15 DIAGNOSIS — M436 Torticollis: Secondary | ICD-10-CM | POA: Diagnosis present

## 2022-07-15 DIAGNOSIS — M542 Cervicalgia: Secondary | ICD-10-CM | POA: Insufficient documentation

## 2022-07-15 DIAGNOSIS — R262 Difficulty in walking, not elsewhere classified: Secondary | ICD-10-CM | POA: Diagnosis present

## 2022-07-15 DIAGNOSIS — M25611 Stiffness of right shoulder, not elsewhere classified: Secondary | ICD-10-CM

## 2022-07-15 DIAGNOSIS — M25562 Pain in left knee: Secondary | ICD-10-CM

## 2022-07-15 DIAGNOSIS — R29898 Other symptoms and signs involving the musculoskeletal system: Secondary | ICD-10-CM

## 2022-07-15 NOTE — Therapy (Signed)
OUTPATIENT PHYSICAL THERAPY NEURO TREATMENT    Patient Name: Kathryn Lucas MRN: [id number] DOB:1960-07-22, 62 y.o., female Today's Date: 07/15/2022   PCP: Ardean Larsen, MD REFERRING PROVIDER: [name], MD  END OF SESSION:  PT End of Session - 07/15/22 1132     Visit Number 14    Number of Visits 24    Date for PT Re-Evaluation 07/29/22    Authorization Type [employer]    Progress Note Due on Visit 20    PT Start Time 1100    PT Stop Time 1145    PT Time Calculation (min) 45 min    Equipment Utilized During Treatment Gait belt    Activity Tolerance Patient tolerated treatment well;Patient limited by pain    Behavior During Therapy WFL for tasks assessed/performed                  Past Medical History:  Diagnosis Date   Breast cancer (HCC)    left breast cancer   Cancer (HCC) 09/2017   left breast cancer   Family history of breast cancer    Hypothyroidism    Personal history of radiation therapy 2019   Thyroid disease    Past Surgical History:  Procedure Laterality Date   BREAST BIOPSY Left 2018   BREAST BIOPSY Left 2019   BREAST RECONSTRUCTION WITH PLACEMENT OF TISSUE EXPANDER AND ALLODERM Left 10/26/2017   Procedure: LEFT BREAST RECONSTRUCTION WITH PLACEMENT OF TISSUE EXPANDER AND ALLODERM;  Surgeon: Glenna Fellows, MD;  Location: [facility];  Service: Plastics;  Laterality: Left;   MASTECTOMY Left 2019   MASTECTOMY WITH RADIOACTIVE SEED GUIDED EXCISION AND AXILLARY SENTINEL LYMPH NODE BIOPSY Left 10/26/2017   Procedure: LEFT MASTECTOMY WITH SEED TARGETED  LEFT AXILLARY LYMPH NODE EXCISION AND LEFT SENTINEL LYMPH NODE BIOPSY;  Surgeon: Almond Lint, MD;  Location: [facility];  Service: General;  Laterality: Left;   TONSILLECTOMY     WISDOM TOOTH EXTRACTION     Patient Active Problem List   Diagnosis Date Noted   Metastatic malignant neoplasm (HCC) 12/19/2021   Family history of breast cancer    History of  therapeutic radiation 04/27/2018   Breast cancer, left breast (HCC) 10/26/2017   Malignant neoplasm of lower-outer quadrant of left breast of female, estrogen receptor positive (HCC) 07/15/2016   Plantar fasciitis 07/30/2015   Metatarsalgia 10/18/2014    ONSET DATE: September 2023  REFERRING DIAG: C79.51 (ICD-10-CM) - Malignant neoplasm metastatic to bone (HCC)   THERAPY DIAG:  Difficulty in walking, not elsewhere classified  Muscle weakness (generalized)  Acute pain of left knee  Leg weakness, bilateral  Stiffness of cervical spine  Decreased range of motion of left shoulder  Decreased range of motion of right shoulder  Painful cervical ROM  Rationale for Evaluation and Treatment: Rehabilitation  SUBJECTIVE:  SUBJECTIVE STATEMENT:  Pt reports that she is doing well. Continues to have soreness in cervical spine, but notes that it was improved following prior PT treatment.    Pt accompanied by: self  PERTINENT HISTORY:   Pt is 62 y.o. female that is seeking therapy for weakness of the R LE.  Pt tripped over the threshold of her door back in September of 2023, and had a transverse fracture of the L patella when she fell on it.  She was recently diagnosed with bone cancer back in October of 2023.  Pt notes that she has more stiffness in the neck and pelvis region because the cancer is located in those locations.  Pt is currently utilizing a hinged knee brace that she received from a friend for support of the L knee.  Secure Chat with referring provider [name], MD, and asked for clarification on any precautions were present due to the pt's cancer diagnosis  Pt is cleared to have modalities performed including dry needling and heat applied per MD.   PAIN:  Are you having pain?  No  PRECAUTIONS: Other: Cancer  WEIGHT BEARING RESTRICTIONS: Pt denies having any weightbearing restrictions on the knee  FALLS: Has patient fallen in last 6 months? Yes. Number of falls 1  LIVING ENVIRONMENT: Lives with: lives alone Lives in: House/apartment Stairs: No Has following equipment at home: Dan Humphreys - 4 wheeled, shower chair, and elevated toilet with bars on the side.  Pt is looking at putting in grab bars in the shower, but has assistant that helps with showers.  PLOF: Independent  PATIENT GOALS: To get stronger, be able to walk better.    OBJECTIVE:   DIAGNOSTIC FINDINGS:   CLINICAL DATA:  Left knee pain   EXAM: LEFT KNEE - 1-2 VIEW  COMPARISON:  October 2022  FINDINGS: Nonunion of left patellar fracture. No new acute fracture. Degenerative changes of the knee demonstrated by subchondral lucencies of the distal femur. Visualized soft tissues are unremarkable.  IMPRESSION: Nonunion of left patellar fracture.   COGNITION: Overall cognitive status: Within functional limits for tasks assessed   SENSATION: WFL   POSTURE: No Significant postural limitations.  Pt is however unable to rotate her neck or move her hips as much due to the cancer in the pt.    CERVICAL ROM:   Active ROM A/PROM (deg) eval AROM 4/17   Flexion 36   Extension 16   Right lateral flexion 11   Left lateral flexion 10   Right rotation 16 28  Left rotation 26 39    (Blank rows = not tested)   LOWER EXTREMITY ROM:     Active  Right Eval Left Eval  Knee flexion 85 deg deferred  Knee extension 17 deg lacking  deferred    LOWER EXTREMITY MMT:    MMT Right Eval Left Eval  Hip flexion 4+ 4  Knee flexion 4+ 4  Knee extension 4+ 4-    UPPER EXTREMITY ROM:  Active ROM Right eval Left eval R 4/17 L 4/17   Shoulder flexion 62 deg 99 deg 76 105   Shoulder scaption 60 deg 56 deg 76  91    UPPER EXTREMITY MMT:  MMT Right eval Left eval  Shoulder flexion 3+ with  pain 4  Shoulder abduction 3+ with pain 4  Grip strength 28# 38#     BED MOBILITY:  Supine to sit Modified independence, extra time necessary.   TRANSFERS: Assistive device utilized: None  Sit to stand:  Modified independence Stand to sit: Modified independence Chair to chair: Modified independence     GAIT: Gait pattern: step through pattern, decreased step length- Right, decreased stance time- Left, and decreased stride length Distance walked: 40' Assistive device utilized: None Level of assistance: Modified independence Comments: Pt ambulates with significant stiffness due to the hinged knee brace on the L LE as well as the stiffness that she has in her joints from the cancer.  FUNCTIONAL TESTS:  5 times sit to stand: 23.43 sec from elevated mat table; 4/17: 16.53sec from slightly elevated mat table   Timed up and go (TUG): 23.33 sec; 4/17: 13.61 average of 2 trials  2 minute walk test: 211 10 meter walk test: 18.33 sec Berg Balance Scale: 43/56; 4/17: 48/56     PATIENT SURVEYS:  FOTO 46/59  TODAY'S TREATMENT: DATE: 07/15/22    Nustep reciprocal movement training BUE/BLE x 1.5 min then BLE only x 3.5 min due to R shoulder pain.   Sit<>stand from mat table 2 x 10 with cues for posture and wide BOS.   Side stepping R and L 3 x 10 ft with no UE support.  Forward step over 6inch hurdles 2 x 10 BLE  Lateral step over hurdle 2 x 10 BLE.   Gait with 4# weight 2 x 145ft with therapeutic rest break between bouts.  Seated LAQ AROM with 5 sec hold 2 x 10 BLE   Cues for full ROM and hold at end range as well as decreased trunkal sway with gait to improve core activation with gait.    PATIENT EDUCATION: Education details: Pt educated throughout session about proper posture and technique with exercises. Improved exercise technique, movement at target joints, use of target muscles after min to mod verbal, visual, tactile cues. Person educated: Patient and Print production planner method: Explanation Education comprehension: verbalized understanding  HOME EXERCISE PROGRAM:  Access Code: [email or web address] URL: [email or web address] Date: 05/19/2022 Prepared by: Grier Rocher  Exercises - Seated Hip Abduction with Resistance  - 1 x daily - 7 x weekly - 3 sets - 10 reps - Standing Hip Extension Kicks  - 1 x daily - 7 x weekly - 3 sets - 10 reps - Standing Hip Abduction Kicks  - 1 x daily - 7 x weekly - 3 sets - 10 reps  Access Code: [email or web address] URL: [email or web address] Date: 05/14/2022 Prepared by: Tomasa Hose  Exercises - Isometric Shoulder Flexion at Wall  - 1 x daily - 7 x weekly - 3 sets - 10 reps - Isometric Shoulder Extension at Wall  - 1 x daily - 7 x weekly - 3 sets - 10 reps - Isometric Shoulder Abduction at Wall  - 1 x daily - 7 x weekly - 3 sets - 10 reps - Isometric Shoulder Adduction  - 1 x daily - 7 x weekly - 3 sets - 10 reps   GOALS:  Goals reviewed with patient? Yes  SHORT TERM GOALS: Target date: 06/03/2022  Pt will be independent with HEP in order to demonstrate increased ability to perform tasks related to occupation/hobbies. Baseline: Not given HEP at initial evaluation. Goal status: MET   LONG TERM GOALS: Target date: 07/29/2022  Patient will demonstrate improved function as evidenced by a score of 59 on FOTO measure for full participation in activities at home and in the community. Baseline: Evaluation: 46, 4/17: 68 Goal status: MET  2.  Patient to demonstrate increased cervical rotation ROM to be 65 deg in order to  return to PLOF and improving safety while driving. Baseline: 16/26 deg 4/17 28/39 DEG Goal status: IN PROGRESS  3.  Pt to improve B shoulder ROM to be WFL (Flexion: 120 deg; Abduction: 130 deg) in order to improve ability to complete tasks Baseline: R/L Shoulder Flex: 62/99; R/L Shoulder Scaption: 60/56 4/17: shoulder flexion 76/105 shoulder scaption 76/91 Goal status: IN  PROGRESS  4.  Pt to improve overall strength of the LE's in order to ambulate for longer distances and be able to tolerate surgery to repair the L knee. Baseline: Global 4/5 strength in the R LE, L LE MMT deferred due to the patella being broken at this time. 4/17 improved strength on the RLE and LLE with knee extension 4-/5, hip flexion and knee flexion 4/5.   Goal status: IN PROGRESS   5.  Patient (> 66 years old) will complete five times sit to stand test in < 15 seconds indicating an increased LE strength and improved balance. Baseline: 23.43 sec 4/17: 16.53sec from slightly elevatedd mat table  Goal status: in progress.    6.  Patient will reduce timed up and go to <11 seconds to reduce fall risk and demonstrate improved transfer/gait ability. Baseline: 23.33 sec 4/17: 13.61 average of 2 trials  Goal status:  IN PROGRESS  7.  Pt will improve BERG by at least 3 points in order to demonstrate clinically significant improvement in balance.  Baseline: 43 4/17: 48/56 Goal status: MET     ASSESSMENT:  CLINICAL IMPRESSION:  Pt put forth good effort in PT treatment. PT treatment focused on dynamic strength and balance training. Pt able to perform higher level balance tasks with CGA from PT to prevent LOB. Pt able to tolerate not wearing knee brace for knee stability on this day with no pain or knee instability noted.  Pt will continue to benefit from skilled therapy to address remaining deficits in order to improve overall QoL and return to PLOF.       OBJECTIVE IMPAIRMENTS: Abnormal gait, decreased activity tolerance, decreased balance, decreased endurance, decreased knowledge of use of DME, decreased mobility, difficulty walking, decreased ROM, decreased strength, hypomobility, impaired flexibility, impaired UE functional use, and pain  ACTIVITY LIMITATIONS: carrying, lifting, bending, sitting, standing, squatting, stairs, transfers, bed mobility, bathing, toileting, dressing, reach  over head, hygiene/grooming, and locomotion level  PARTICIPATION LIMITATIONS: meal prep, cleaning, laundry, driving, shopping, community activity, occupation, and yard work  PERSONAL FACTORS: Time since onset of injury/illness/exacerbation and 3+ comorbidities: Hx of breast cancer, bone cancer, thyroid disease  are also affecting patient's functional outcome.   REHAB POTENTIAL: Fair due to multiple joints needing to be targeted and pt's current bone cancer that is causing her joints to stiffen and become more rigid.    CLINICAL DECISION MAKING: Evolving/moderate complexity  EVALUATION COMPLEXITY: Moderate  PLAN:  PT FREQUENCY: 2x/week  PT DURATION: 12 weeks  PLANNED INTERVENTIONS: Therapeutic exercises, Therapeutic activity, Neuromuscular re-education, Balance training, Gait training, Patient/Family education, Self Care, Joint mobilization, Stair training, Vestibular training, Canalith repositioning, DME instructions, Aquatic Therapy, Dry Needling, Spinal mobilization, Cryotherapy, Moist heat, Manual therapy, and Re-evaluation  PLAN FOR NEXT SESSION:    Allow pt identifies as most pressing issue (shoulder vs neck, vs LE) and treat with strengthening or stretching program to assist with ROM and pain modulation.    Grier Rocher PT, DPT  Physical Therapist - [facility]  07/15/22, 11:35 AM

## 2022-07-20 ENCOUNTER — Inpatient Hospital Stay: Payer: Commercial Managed Care - PPO | Attending: Hematology and Oncology

## 2022-07-20 ENCOUNTER — Encounter: Payer: Commercial Managed Care - PPO | Admitting: Physical Therapy

## 2022-07-20 VITALS — BP 109/77 | HR 109 | Temp 98.7°F | Resp 18

## 2022-07-20 DIAGNOSIS — C7951 Secondary malignant neoplasm of bone: Secondary | ICD-10-CM | POA: Insufficient documentation

## 2022-07-20 DIAGNOSIS — Z170 Estrogen receptor positive status [ER+]: Secondary | ICD-10-CM | POA: Insufficient documentation

## 2022-07-20 DIAGNOSIS — C50512 Malignant neoplasm of lower-outer quadrant of left female breast: Secondary | ICD-10-CM | POA: Insufficient documentation

## 2022-07-20 DIAGNOSIS — Z5111 Encounter for antineoplastic chemotherapy: Secondary | ICD-10-CM | POA: Diagnosis present

## 2022-07-20 DIAGNOSIS — C799 Secondary malignant neoplasm of unspecified site: Secondary | ICD-10-CM

## 2022-07-20 DIAGNOSIS — Z79811 Long term (current) use of aromatase inhibitors: Secondary | ICD-10-CM | POA: Insufficient documentation

## 2022-07-20 MED ORDER — FULVESTRANT 250 MG/5ML IM SOSY
500.0000 mg | PREFILLED_SYRINGE | Freq: Once | INTRAMUSCULAR | Status: AC
  Administered 2022-07-20: 500 mg via INTRAMUSCULAR
  Filled 2022-07-20: qty 10

## 2022-07-20 NOTE — Patient Instructions (Signed)

## 2022-07-22 ENCOUNTER — Ambulatory Visit: Payer: Commercial Managed Care - PPO | Admitting: Physical Therapy

## 2022-07-22 DIAGNOSIS — R262 Difficulty in walking, not elsewhere classified: Secondary | ICD-10-CM | POA: Diagnosis not present

## 2022-07-22 DIAGNOSIS — M436 Torticollis: Secondary | ICD-10-CM

## 2022-07-22 DIAGNOSIS — M542 Cervicalgia: Secondary | ICD-10-CM

## 2022-07-22 DIAGNOSIS — M25562 Pain in left knee: Secondary | ICD-10-CM

## 2022-07-22 DIAGNOSIS — M6281 Muscle weakness (generalized): Secondary | ICD-10-CM

## 2022-07-22 DIAGNOSIS — M25612 Stiffness of left shoulder, not elsewhere classified: Secondary | ICD-10-CM

## 2022-07-22 DIAGNOSIS — M25611 Stiffness of right shoulder, not elsewhere classified: Secondary | ICD-10-CM

## 2022-07-22 DIAGNOSIS — R29898 Other symptoms and signs involving the musculoskeletal system: Secondary | ICD-10-CM

## 2022-07-22 NOTE — Therapy (Unsigned)
OUTPATIENT PHYSICAL THERAPY NEURO TREATMENT    Patient Name: Kathryn Lucas MRN: [id number] DOB:05/07/1960, 62 y.o., female Today's Date: 07/22/2022   PCP: Ardean Larsen, MD REFERRING PROVIDER: [name], MD  END OF SESSION:         Past Medical History:  Diagnosis Date   Breast cancer [facility])    left breast cancer   Cancer (HCC) 09/2017   left breast cancer   Family history of breast cancer    Hypothyroidism    Personal history of radiation therapy 2019   Thyroid disease    Past Surgical History:  Procedure Laterality Date   BREAST BIOPSY Left 2018   BREAST BIOPSY Left 2019   BREAST RECONSTRUCTION WITH PLACEMENT OF TISSUE EXPANDER AND ALLODERM Left 10/26/2017   Procedure: LEFT BREAST RECONSTRUCTION WITH PLACEMENT OF TISSUE EXPANDER AND ALLODERM;  Surgeon: Glenna Fellows, MD;  Location: [facility];  Service: Plastics;  Laterality: Left;   MASTECTOMY Left 2019   MASTECTOMY WITH RADIOACTIVE SEED GUIDED EXCISION AND AXILLARY SENTINEL LYMPH NODE BIOPSY Left 10/26/2017   Procedure: LEFT MASTECTOMY WITH SEED TARGETED  LEFT AXILLARY LYMPH NODE EXCISION AND LEFT SENTINEL LYMPH NODE BIOPSY;  Surgeon: Almond Lint, MD;  Location: [facility];  Service: General;  Laterality: Left;   TONSILLECTOMY     WISDOM TOOTH EXTRACTION     Patient Active Problem List   Diagnosis Date Noted   Metastatic malignant neoplasm (HCC) 12/19/2021   Family history of breast cancer    History of therapeutic radiation 04/27/2018   Breast cancer, left breast (HCC) 10/26/2017   Malignant neoplasm of lower-outer quadrant of left breast of female, estrogen receptor positive (HCC) 07/15/2016   Plantar fasciitis 07/30/2015   Metatarsalgia 10/18/2014    ONSET DATE: September 2023  REFERRING DIAG: C79.51 (ICD-10-CM) - Malignant neoplasm metastatic to bone (HCC)   THERAPY DIAG:  No diagnosis found.  Rationale for Evaluation and Treatment: Rehabilitation  SUBJECTIVE:                                                                                                                                                                                              SUBJECTIVE STATEMENT:  Pt reports that she is doing well. Continues to have soreness in cervical spine, but notes that it was improved following prior PT treatment.    Pt accompanied by: self  PERTINENT HISTORY:   Pt is 62 y.o. female that is seeking therapy for weakness of the R LE.  Pt tripped over the threshold of her door back in September of 2023, and had a transverse fracture of the L patella  when she fell on it.  She was recently diagnosed with bone cancer back in October of 2023.  Pt notes that she has more stiffness in the neck and pelvis region because the cancer is located in those locations.  Pt is currently utilizing a hinged knee brace that she received from a friend for support of the L knee.  Secure Chat with referring provider [name], MD, and asked for clarification on any precautions were present due to the pt's cancer diagnosis  Pt is cleared to have modalities performed including dry needling and heat applied per MD.   PAIN:  Are you having pain? No  PRECAUTIONS: Other: Cancer  WEIGHT BEARING RESTRICTIONS: Pt denies having any weightbearing restrictions on the knee  FALLS: Has patient fallen in last 6 months? Yes. Number of falls 1  LIVING ENVIRONMENT: Lives with: lives alone Lives in: House/apartment Stairs: No Has following equipment at home: Dan Humphreys - 4 wheeled, shower chair, and elevated toilet with bars on the side.  Pt is looking at putting in grab bars in the shower, but has assistant that helps with showers.  PLOF: Independent  PATIENT GOALS: To get stronger, be able to walk better.    OBJECTIVE:   DIAGNOSTIC FINDINGS:   CLINICAL DATA:  Left knee pain   EXAM: LEFT KNEE - 1-2 VIEW  COMPARISON:  October 2022  FINDINGS: Nonunion of left patellar fracture.  No new acute fracture. Degenerative changes of the knee demonstrated by subchondral lucencies of the distal femur. Visualized soft tissues are unremarkable.  IMPRESSION: Nonunion of left patellar fracture.   COGNITION: Overall cognitive status: Within functional limits for tasks assessed   SENSATION: WFL   POSTURE: No Significant postural limitations.  Pt is however unable to rotate her neck or move her hips as much due to the cancer in the pt.    CERVICAL ROM:   Active ROM A/PROM (deg) eval AROM 4/17   Flexion 36   Extension 16   Right lateral flexion 11   Left lateral flexion 10   Right rotation 16 28  Left rotation 26 39    (Blank rows = not tested)   LOWER EXTREMITY ROM:     Active  Right Eval Left Eval  Knee flexion 85 deg deferred  Knee extension 17 deg lacking  deferred    LOWER EXTREMITY MMT:    MMT Right Eval Left Eval  Hip flexion 4+ 4  Knee flexion 4+ 4  Knee extension 4+ 4-    UPPER EXTREMITY ROM:  Active ROM Right eval Left eval R 4/17 L 4/17   Shoulder flexion 62 deg 99 deg 76 105   Shoulder scaption 60 deg 56 deg 76  91    UPPER EXTREMITY MMT:  MMT Right eval Left eval  Shoulder flexion 3+ with pain 4  Shoulder abduction 3+ with pain 4  Grip strength 28# 38#     BED MOBILITY:  Supine to sit Modified independence, extra time necessary.   TRANSFERS: Assistive device utilized: None  Sit to stand: Modified independence Stand to sit: Modified independence Chair to chair: Modified independence     GAIT: Gait pattern: step through pattern, decreased step length- Right, decreased stance time- Left, and decreased stride length Distance walked: 40' Assistive device utilized: None Level of assistance: Modified independence Comments: Pt ambulates with significant stiffness due to the hinged knee brace on the L LE as well as the stiffness that she has in her joints from the cancer.  FUNCTIONAL  TESTS:  5 times sit to stand:  23.43 sec from elevated mat table; 4/17: 16.53sec from slightly elevated mat table   Timed up and go (TUG): 23.33 sec; 4/17: 13.61 average of 2 trials  2 minute walk test: 211 10 meter walk test: 18.33 sec Berg Balance Scale: 43/56; 4/17: 48/56     PATIENT SURVEYS:  FOTO 46/59  TODAY'S TREATMENT: DATE: 07/22/22    Nustep reciprocal movement training BUE/BLE x 1.5 min then BLE only x 3.5 min due to R shoulder pain.   Sit<>stand from mat table 2 x 10 with cues for posture and wide BOS.   Side stepping R and L 3 x 10 ft with no UE support.  Forward step over 6inch hurdles 2 x 10 BLE  Lateral step over hurdle 2 x 10 BLE.   Gait with 4# weight 2 x 132ft with therapeutic rest break between bouts.  Seated LAQ AROM with 5 sec hold 2 x 10 BLE   Cues for full ROM and hold at end range as well as decreased trunkal sway with gait to improve core activation with gait.    PATIENT EDUCATION: Education details: Pt educated throughout session about proper posture and technique with exercises. Improved exercise technique, movement at target joints, use of target muscles after min to mod verbal, visual, tactile cues. Person educated: Patient and Nurse, mental health method: Explanation Education comprehension: verbalized understanding  HOME EXERCISE PROGRAM:  Access Code: [email or web address] URL: [email or web address] Date: 05/19/2022 Prepared by: Grier Rocher  Exercises - Seated Hip Abduction with Resistance  - 1 x daily - 7 x weekly - 3 sets - 10 reps - Standing Hip Extension Kicks  - 1 x daily - 7 x weekly - 3 sets - 10 reps - Standing Hip Abduction Kicks  - 1 x daily - 7 x weekly - 3 sets - 10 reps  Access Code: [email or web address] URL: [email or web address] Date: 05/14/2022 Prepared by: Tomasa Hose  Exercises - Isometric Shoulder Flexion at Wall  - 1 x daily - 7 x weekly - 3 sets - 10 reps - Isometric Shoulder Extension at Wall  - 1 x daily - 7 x weekly - 3 sets -  10 reps - Isometric Shoulder Abduction at Wall  - 1 x daily - 7 x weekly - 3 sets - 10 reps - Isometric Shoulder Adduction  - 1 x daily - 7 x weekly - 3 sets - 10 reps   GOALS:  Goals reviewed with patient? Yes  SHORT TERM GOALS: Target date: 06/03/2022  Pt will be independent with HEP in order to demonstrate increased ability to perform tasks related to occupation/hobbies. Baseline: Not given HEP at initial evaluation. Goal status: MET   LONG TERM GOALS: Target date: 07/29/2022  Patient will demonstrate improved function as evidenced by a score of 59 on FOTO measure for full participation in activities at home and in the community. Baseline: Evaluation: 46, 4/17: 68 Goal status: MET  2.  Patient to demonstrate increased cervical rotation ROM to be 65 deg in order to return to PLOF and improving safety while driving. Baseline: 16/26 deg 4/17 28/39 DEG Goal status: IN PROGRESS  3.  Pt to improve B shoulder ROM to be WFL (Flexion: 120 deg; Abduction: 130 deg) in order to improve ability to complete tasks Baseline: R/L Shoulder Flex: 62/99; R/L Shoulder Scaption: 60/56 4/17: shoulder flexion 76/105 shoulder scaption 76/91 Goal status: IN PROGRESS  4.  Pt to improve overall strength of the LE's in order  to ambulate for longer distances and be able to tolerate surgery to repair the L knee. Baseline: Global 4/5 strength in the R LE, L LE MMT deferred due to the patella being broken at this time. 4/17 improved strength on the RLE and LLE with knee extension 4-/5, hip flexion and knee flexion 4/5.   Goal status: IN PROGRESS   5.  Patient (> 42 years old) will complete five times sit to stand test in < 15 seconds indicating an increased LE strength and improved balance. Baseline: 23.43 sec 4/17: 16.53sec from slightly elevatedd mat table  Goal status: in progress.    6.  Patient will reduce timed up and go to <11 seconds to reduce fall risk and demonstrate improved transfer/gait  ability. Baseline: 23.33 sec 4/17: 13.61 average of 2 trials  Goal status:  IN PROGRESS  7.  Pt will improve BERG by at least 3 points in order to demonstrate clinically significant improvement in balance.  Baseline: 43 4/17: 48/56 Goal status: MET     ASSESSMENT:  CLINICAL IMPRESSION:  Pt put forth good effort in PT treatment. PT treatment focused on dynamic strength and balance training. Pt able to perform higher level balance tasks with CGA from PT to prevent LOB. Pt able to tolerate not wearing knee brace for knee stability on this day with no pain or knee instability noted.  Pt will continue to benefit from skilled therapy to address remaining deficits in order to improve overall QoL and return to PLOF.       OBJECTIVE IMPAIRMENTS: Abnormal gait, decreased activity tolerance, decreased balance, decreased endurance, decreased knowledge of use of DME, decreased mobility, difficulty walking, decreased ROM, decreased strength, hypomobility, impaired flexibility, impaired UE functional use, and pain  ACTIVITY LIMITATIONS: carrying, lifting, bending, sitting, standing, squatting, stairs, transfers, bed mobility, bathing, toileting, dressing, reach over head, hygiene/grooming, and locomotion level  PARTICIPATION LIMITATIONS: meal prep, cleaning, laundry, driving, shopping, community activity, occupation, and yard work  PERSONAL FACTORS: Time since onset of injury/illness/exacerbation and 3+ comorbidities: Hx of breast cancer, bone cancer, thyroid disease  are also affecting patient's functional outcome.   REHAB POTENTIAL: Fair due to multiple joints needing to be targeted and pt's current bone cancer that is causing her joints to stiffen and become more rigid.    CLINICAL DECISION MAKING: Evolving/moderate complexity  EVALUATION COMPLEXITY: Moderate  PLAN:  PT FREQUENCY: 2x/week  PT DURATION: 12 weeks  PLANNED INTERVENTIONS: Therapeutic exercises, Therapeutic activity,  Neuromuscular re-education, Balance training, Gait training, Patient/Family education, Self Care, Joint mobilization, Stair training, Vestibular training, Canalith repositioning, DME instructions, Aquatic Therapy, Dry Needling, Spinal mobilization, Cryotherapy, Moist heat, Manual therapy, and Re-evaluation  PLAN FOR NEXT SESSION:    Allow pt identifies as most pressing issue (shoulder vs neck, vs LE) and treat with strengthening or stretching program to assist with ROM and pain modulation.    Norman Herrlich PT ,DPT Physical Therapist- [facility]   07/22/22, 8:15 AM

## 2022-07-22 NOTE — Therapy (Unsigned)
OUTPATIENT PHYSICAL THERAPY NEURO TREATMENT    Patient Name: Kathryn Lucas MRN: [id number] DOB:11-Jun-1960, 62 y.o., female Today's Date: 07/23/2022   PCP: Ardean Larsen, MD REFERRING PROVIDER: [name], MD  END OF SESSION:  PT End of Session - 07/22/22 1620     Visit Number 15    Number of Visits 24    Date for PT Re-Evaluation 07/29/22    Authorization Type [employer]    Progress Note Due on Visit 20    PT Start Time 1602    PT Stop Time 1645    PT Time Calculation (min) 43 min    Equipment Utilized During Treatment Gait belt    Activity Tolerance Patient tolerated treatment well;Patient limited by pain    Behavior During Therapy [facility] for tasks assessed/performed                  Past Medical History:  Diagnosis Date   Breast cancer (HCC)    left breast cancer   Cancer (HCC) 09/2017   left breast cancer   Family history of breast cancer    Hypothyroidism    Personal history of radiation therapy 2019   Thyroid disease    Past Surgical History:  Procedure Laterality Date   BREAST BIOPSY Left 2018   BREAST BIOPSY Left 2019   BREAST RECONSTRUCTION WITH PLACEMENT OF TISSUE EXPANDER AND ALLODERM Left 10/26/2017   Procedure: LEFT BREAST RECONSTRUCTION WITH PLACEMENT OF TISSUE EXPANDER AND ALLODERM;  Surgeon: Glenna Fellows, MD;  Location: [facility];  Service: Plastics;  Laterality: Left;   MASTECTOMY Left 2019   MASTECTOMY WITH RADIOACTIVE SEED GUIDED EXCISION AND AXILLARY SENTINEL LYMPH NODE BIOPSY Left 10/26/2017   Procedure: LEFT MASTECTOMY WITH SEED TARGETED  LEFT AXILLARY LYMPH NODE EXCISION AND LEFT SENTINEL LYMPH NODE BIOPSY;  Surgeon: Almond Lint, MD;  Location: [facility];  Service: General;  Laterality: Left;   TONSILLECTOMY     WISDOM TOOTH EXTRACTION     Patient Active Problem List   Diagnosis Date Noted   Metastatic malignant neoplasm (HCC) 12/19/2021   Family history of breast cancer    History of  therapeutic radiation 04/27/2018   Breast cancer, left breast (HCC) 10/26/2017   Malignant neoplasm of lower-outer quadrant of left breast of female, estrogen receptor positive (HCC) 07/15/2016   Plantar fasciitis 07/30/2015   Metatarsalgia 10/18/2014    ONSET DATE: September 2023  REFERRING DIAG: C79.51 (ICD-10-CM) - Malignant neoplasm metastatic to bone (HCC)   THERAPY DIAG:  Difficulty in walking, not elsewhere classified  Muscle weakness (generalized)  Acute pain of left knee  Decreased range of motion of right shoulder  Stiffness of cervical spine  Decreased range of motion of left shoulder  Leg weakness, bilateral  Painful cervical ROM  Rationale for Evaluation and Treatment: Rehabilitation  SUBJECTIVE:  SUBJECTIVE STATEMENT:  Pt reports that she is doing well. Reports decreased UE and cervical tightness on this day.      Pt accompanied by: self  PERTINENT HISTORY:   Pt is 62 y.o. female that is seeking therapy for weakness of the R LE.  Pt tripped over the threshold of her door back in September of 2023, and had a transverse fracture of the L patella when she fell on it.  She was recently diagnosed with bone cancer back in October of 2023.  Pt notes that she has more stiffness in the neck and pelvis region because the cancer is located in those locations.  Pt is currently utilizing a hinged knee brace that she received from a friend for support of the L knee.  Secure Chat with referring provider [name], MD, and asked for clarification on any precautions were present due to the pt's cancer diagnosis  Pt is cleared to have modalities performed including dry needling and heat applied per MD.   PAIN:  Are you having pain? No  PRECAUTIONS: Other: Cancer  WEIGHT BEARING  RESTRICTIONS: Pt denies having any weightbearing restrictions on the knee  FALLS: Has patient fallen in last 6 months? Yes. Number of falls 1  LIVING ENVIRONMENT: Lives with: lives alone Lives in: House/apartment Stairs: No Has following equipment at home: Dan Humphreys - 4 wheeled, shower chair, and elevated toilet with bars on the side.  Pt is looking at putting in grab bars in the shower, but has assistant that helps with showers.  PLOF: Independent  PATIENT GOALS: To get stronger, be able to walk better.    OBJECTIVE:   DIAGNOSTIC FINDINGS:   CLINICAL DATA:  Left knee pain   EXAM: LEFT KNEE - 1-2 VIEW  COMPARISON:  October 2022  FINDINGS: Nonunion of left patellar fracture. No new acute fracture. Degenerative changes of the knee demonstrated by subchondral lucencies of the distal femur. Visualized soft tissues are unremarkable.  IMPRESSION: Nonunion of left patellar fracture.   COGNITION: Overall cognitive status: Within functional limits for tasks assessed   SENSATION: WFL   POSTURE: No Significant postural limitations.  Pt is however unable to rotate her neck or move her hips as much due to the cancer in the pt.    CERVICAL ROM:   Active ROM A/PROM (deg) eval AROM 4/17   Flexion 36   Extension 16   Right lateral flexion 11   Left lateral flexion 10   Right rotation 16 28  Left rotation 26 39    (Blank rows = not tested)   LOWER EXTREMITY ROM:     Active  Right Eval Left Eval  Knee flexion 85 deg deferred  Knee extension 17 deg lacking  deferred    LOWER EXTREMITY MMT:    MMT Right Eval Left Eval  Hip flexion 4+ 4  Knee flexion 4+ 4  Knee extension 4+ 4-    UPPER EXTREMITY ROM:  Active ROM Right eval Left eval R 4/17 L 4/17   Shoulder flexion 62 deg 99 deg 76 105   Shoulder scaption 60 deg 56 deg 76  91    UPPER EXTREMITY MMT:  MMT Right eval Left eval  Shoulder flexion 3+ with pain 4  Shoulder abduction 3+ with pain 4  Grip  strength 28# 38#     BED MOBILITY:  Supine to sit Modified independence, extra time necessary.   TRANSFERS: Assistive device utilized: None  Sit to stand: Modified independence Stand to sit: Modified  independence Chair to chair: Modified independence     GAIT: Gait pattern: step through pattern, decreased step length- Right, decreased stance time- Left, and decreased stride length Distance walked: 40' Assistive device utilized: None Level of assistance: Modified independence Comments: Pt ambulates with significant stiffness due to the hinged knee brace on the L LE as well as the stiffness that she has in her joints from the cancer.  FUNCTIONAL TESTS:  5 times sit to stand: 23.43 sec from elevated mat table; 4/17: 16.53sec from slightly elevated mat table   Timed up and go (TUG): 23.33 sec; 4/17: 13.61 average of 2 trials  2 minute walk test: 211 10 meter walk test: 18.33 sec Berg Balance Scale: 43/56; 4/17: 48/56     PATIENT SURVEYS:  FOTO 46/59  TODAY'S TREATMENT: DATE: 07/23/22    UBE 3 min forward, 1 min reverse. Pt reports that she   Side stepping 3 x 83ft bil AROM then added 3# ankle weights 4x ft  Lateral step up 3# ankle weights x 8 bil to 4inch step Forward step up 3# ankle weights 2 x 8 bil to   Gait with 3# ankle weights x 300t with supervision assist   Seated therex:  LAQ 3# ankle weights x5 BLE with 2 sec hold at end range.  Hip abduction 2 x 12 GTB.     PATIENT EDUCATION: Education details: Pt educated throughout session about proper posture and technique with exercises. Improved exercise technique, movement at target joints, use of target muscles after min to mod verbal, visual, tactile cues. Person educated: Patient and Nurse, mental health method: Explanation Education comprehension: verbalized understanding  HOME EXERCISE PROGRAM:  Access Code: [email or web address] URL: [email or web address] Date: 05/19/2022 Prepared by: Grier Rocher  Exercises - Seated Hip Abduction with Resistance  - 1 x daily - 7 x weekly - 3 sets - 10 reps - Standing Hip Extension Kicks  - 1 x daily - 7 x weekly - 3 sets - 10 reps - Standing Hip Abduction Kicks  - 1 x daily - 7 x weekly - 3 sets - 10 reps  Access Code: [email or web address] URL: [email or web address] Date: 05/14/2022 Prepared by: Tomasa Hose  Exercises - Isometric Shoulder Flexion at Wall  - 1 x daily - 7 x weekly - 3 sets - 10 reps - Isometric Shoulder Extension at Wall  - 1 x daily - 7 x weekly - 3 sets - 10 reps - Isometric Shoulder Abduction at Wall  - 1 x daily - 7 x weekly - 3 sets - 10 reps - Isometric Shoulder Adduction  - 1 x daily - 7 x weekly - 3 sets - 10 reps   GOALS:  Goals reviewed with patient? Yes  SHORT TERM GOALS: Target date: 06/03/2022  Pt will be independent with HEP in order to demonstrate increased ability to perform tasks related to occupation/hobbies. Baseline: Not given HEP at initial evaluation. Goal status: MET   LONG TERM GOALS: Target date: 07/29/2022  Patient will demonstrate improved function as evidenced by a score of 59 on FOTO measure for full participation in activities at home and in the community. Baseline: Evaluation: 46, 4/17: 68 Goal status: MET  2.  Patient to demonstrate increased cervical rotation ROM to be 65 deg in order to return to PLOF and improving safety while driving. Baseline: 16/26 deg 4/17 28/39 DEG Goal status: IN PROGRESS  3.  Pt to improve B shoulder ROM to be WFL (Flexion: 120 deg; Abduction: 130 deg) in order  to improve ability to complete tasks Baseline: R/L Shoulder Flex: 62/99; R/L Shoulder Scaption: 60/56 4/17: shoulder flexion 76/105 shoulder scaption 76/91 Goal status: IN PROGRESS  4.  Pt to improve overall strength of the LE's in order to ambulate for longer distances and be able to tolerate surgery to repair the L knee. Baseline: Global 4/5 strength in the R LE, L LE MMT deferred due to  the patella being broken at this time. 4/17 improved strength on the RLE and LLE with knee extension 4-/5, hip flexion and knee flexion 4/5.   Goal status: IN PROGRESS   5.  Patient (> 55 years old) will complete five times sit to stand test in < 15 seconds indicating an increased LE strength and improved balance. Baseline: 23.43 sec 4/17: 16.53sec from slightly elevatedd mat table  Goal status: in progress.    6.  Patient will reduce timed up and go to <11 seconds to reduce fall risk and demonstrate improved transfer/gait ability. Baseline: 23.33 sec 4/17: 13.61 average of 2 trials  Goal status:  IN PROGRESS  7.  Pt will improve BERG by at least 3 points in order to demonstrate clinically significant improvement in balance.  Baseline: 43 4/17: 48/56 Goal status: MET     ASSESSMENT:  CLINICAL IMPRESSION:  Pt put forth good effort in PT treatment. PT treatment focused on dynamic strength and balance training. Pt demonstrating improved posture and hip strength on this day with functional mobility training. Continues to tolerate gait without Bledsoe brace on RLE without increased. Pt will continue to benefit from skilled therapy to address remaining deficits in order to improve overall QoL and return to PLOF.       OBJECTIVE IMPAIRMENTS: Abnormal gait, decreased activity tolerance, decreased balance, decreased endurance, decreased knowledge of use of DME, decreased mobility, difficulty walking, decreased ROM, decreased strength, hypomobility, impaired flexibility, impaired UE functional use, and pain  ACTIVITY LIMITATIONS: carrying, lifting, bending, sitting, standing, squatting, stairs, transfers, bed mobility, bathing, toileting, dressing, reach over head, hygiene/grooming, and locomotion level  PARTICIPATION LIMITATIONS: meal prep, cleaning, laundry, driving, shopping, community activity, occupation, and yard work  PERSONAL FACTORS: Time since onset of injury/illness/exacerbation and  3+ comorbidities: Hx of breast cancer, bone cancer, thyroid disease  are also affecting patient's functional outcome.   REHAB POTENTIAL: Fair due to multiple joints needing to be targeted and pt's current bone cancer that is causing her joints to stiffen and become more rigid.    CLINICAL DECISION MAKING: Evolving/moderate complexity  EVALUATION COMPLEXITY: Moderate  PLAN:  PT FREQUENCY: 2x/week  PT DURATION: 12 weeks  PLANNED INTERVENTIONS: Therapeutic exercises, Therapeutic activity, Neuromuscular re-education, Balance training, Gait training, Patient/Family education, Self Care, Joint mobilization, Stair training, Vestibular training, Canalith repositioning, DME instructions, Aquatic Therapy, Dry Needling, Spinal mobilization, Cryotherapy, Moist heat, Manual therapy, and Re-evaluation  PLAN FOR NEXT SESSION:    Allow pt identifies as most pressing issue (shoulder vs neck, vs LE) and treat with strengthening or stretching program to assist with ROM and pain modulation.    Grier Rocher PT, DPT  Physical Therapist - [facility]  07/23/22, 10:13 AM

## 2022-07-27 ENCOUNTER — Ambulatory Visit: Payer: Commercial Managed Care - PPO | Admitting: Physical Therapy

## 2022-07-27 DIAGNOSIS — R262 Difficulty in walking, not elsewhere classified: Secondary | ICD-10-CM | POA: Diagnosis not present

## 2022-07-27 DIAGNOSIS — M25562 Pain in left knee: Secondary | ICD-10-CM

## 2022-07-27 DIAGNOSIS — R29898 Other symptoms and signs involving the musculoskeletal system: Secondary | ICD-10-CM

## 2022-07-27 DIAGNOSIS — M25612 Stiffness of left shoulder, not elsewhere classified: Secondary | ICD-10-CM

## 2022-07-27 DIAGNOSIS — M436 Torticollis: Secondary | ICD-10-CM

## 2022-07-27 DIAGNOSIS — M542 Cervicalgia: Secondary | ICD-10-CM

## 2022-07-27 DIAGNOSIS — M6281 Muscle weakness (generalized): Secondary | ICD-10-CM

## 2022-07-27 DIAGNOSIS — M25611 Stiffness of right shoulder, not elsewhere classified: Secondary | ICD-10-CM

## 2022-07-27 NOTE — Therapy (Signed)
OUTPATIENT PHYSICAL THERAPY NEURO TREATMENT/ Re-certification    Patient Name: Kathryn Lucas MRN: [id number] DOB:03-23-1960, 62 y.o., female Today's Date: 07/27/2022   PCP: Ardean Larsen, MD REFERRING PROVIDER: [name], MD  END OF SESSION:  PT End of Session - 07/27/22 1353     Visit Number 16    Number of Visits 40    Date for PT Re-Evaluation 10/19/22    Authorization Type [employer]    Progress Note Due on Visit 20    PT Start Time 1353    PT Stop Time 1430    PT Time Calculation (min) 37 min    Equipment Utilized During Treatment Gait belt    Activity Tolerance Patient tolerated treatment well;Patient limited by pain    Behavior During Therapy WFL for tasks assessed/performed                  Past Medical History:  Diagnosis Date   Breast cancer (HCC)    left breast cancer   Cancer (HCC) 09/2017   left breast cancer   Family history of breast cancer    Hypothyroidism    Personal history of radiation therapy 2019   Thyroid disease    Past Surgical History:  Procedure Laterality Date   BREAST BIOPSY Left 2018   BREAST BIOPSY Left 2019   BREAST RECONSTRUCTION WITH PLACEMENT OF TISSUE EXPANDER AND ALLODERM Left 10/26/2017   Procedure: LEFT BREAST RECONSTRUCTION WITH PLACEMENT OF TISSUE EXPANDER AND ALLODERM;  Surgeon: Glenna Fellows, MD;  Location: [facility];  Service: Plastics;  Laterality: Left;   MASTECTOMY Left 2019   MASTECTOMY WITH RADIOACTIVE SEED GUIDED EXCISION AND AXILLARY SENTINEL LYMPH NODE BIOPSY Left 10/26/2017   Procedure: LEFT MASTECTOMY WITH SEED TARGETED  LEFT AXILLARY LYMPH NODE EXCISION AND LEFT SENTINEL LYMPH NODE BIOPSY;  Surgeon: Almond Lint, MD;  Location: [facility];  Service: General;  Laterality: Left;   TONSILLECTOMY     WISDOM TOOTH EXTRACTION     Patient Active Problem List   Diagnosis Date Noted   Metastatic malignant neoplasm (HCC) 12/19/2021   Family history of breast cancer     History of therapeutic radiation 04/27/2018   Breast cancer, left breast (HCC) 10/26/2017   Malignant neoplasm of lower-outer quadrant of left breast of female, estrogen receptor positive (HCC) 07/15/2016   Plantar fasciitis 07/30/2015   Metatarsalgia 10/18/2014    ONSET DATE: September 2023  REFERRING DIAG: C79.51 (ICD-10-CM) - Malignant neoplasm metastatic to bone (HCC)   THERAPY DIAG:  Difficulty in walking, not elsewhere classified  Muscle weakness (generalized)  Acute pain of left knee  Leg weakness, bilateral  Decreased range of motion of right shoulder  Stiffness of cervical spine  Decreased range of motion of left shoulder  Painful cervical ROM  Rationale for Evaluation and Treatment: Rehabilitation  SUBJECTIVE:  SUBJECTIVE STATEMENT:  Pt arrived slightly late to PT treatment.  She reports that she is going okay. States that she has lost 5lbs over the last 2 months. MD is aware and pt is being monitored. No pain but mild knee stiffness on the L       Pt accompanied by: self  PERTINENT HISTORY:   Pt is 62 y.o. female that is seeking therapy for weakness of the R LE.  Pt tripped over the threshold of her door back in September of 2023, and had a transverse fracture of the L patella when she fell on it.  She was recently diagnosed with bone cancer back in October of 2023.  Pt notes that she has more stiffness in the neck and pelvis region because the cancer is located in those locations.  Pt is currently utilizing a hinged knee brace that she received from a friend for support of the L knee.  Secure Chat with referring provider [name], MD, and asked for clarification on any precautions were present due to the pt's cancer diagnosis  Pt is cleared to have modalities performed  including dry needling and heat applied per MD.   PAIN:  Are you having pain? No  PRECAUTIONS: Other: Cancer  WEIGHT BEARING RESTRICTIONS: Pt denies having any weightbearing restrictions on the knee  FALLS: Has patient fallen in last 6 months? Yes. Number of falls 1  LIVING ENVIRONMENT: Lives with: lives alone Lives in: House/apartment Stairs: No Has following equipment at home: Dan Humphreys - 4 wheeled, shower chair, and elevated toilet with bars on the side.  Pt is looking at putting in grab bars in the shower, but has assistant that helps with showers.  PLOF: Independent  PATIENT GOALS: To get stronger, be able to walk better.    OBJECTIVE:   DIAGNOSTIC FINDINGS:   CLINICAL DATA:  Left knee pain   EXAM: LEFT KNEE - 1-2 VIEW  COMPARISON:  October 2022  FINDINGS: Nonunion of left patellar fracture. No new acute fracture. Degenerative changes of the knee demonstrated by subchondral lucencies of the distal femur. Visualized soft tissues are unremarkable.  IMPRESSION: Nonunion of left patellar fracture.   COGNITION: Overall cognitive status: Within functional limits for tasks assessed   SENSATION: WFL   POSTURE: No Significant postural limitations.  Pt is however unable to rotate her neck or move her hips as much due to the cancer in the pt.    CERVICAL ROM:   Active ROM A/PROM (deg) eval AROM 4/17   Flexion 36   Extension 16   Right lateral flexion 11   Left lateral flexion 10   Right rotation 16 28  Left rotation 26 39    (Blank rows = not tested)   LOWER EXTREMITY ROM:     Active  Right Eval Left Eval  Knee flexion 85 deg deferred  Knee extension 17 deg lacking  deferred    LOWER EXTREMITY MMT:    MMT Right Eval Left Eval  Hip flexion 4+ 4  Knee flexion 4+ 4  Knee extension 4+ 4-    UPPER EXTREMITY ROM:  Active ROM Right eval Left eval R 4/17 L 4/17   Shoulder flexion 62 deg 99 deg 76 105   Shoulder scaption 60 deg 56 deg 76  91     UPPER EXTREMITY MMT:  MMT Right eval Left eval  Shoulder flexion 3+ with pain 4  Shoulder abduction 3+ with pain 4  Grip strength 28# 38#  BED MOBILITY:  Supine to sit Modified independence, extra time necessary.   TRANSFERS: Assistive device utilized: None  Sit to stand: Modified independence Stand to sit: Modified independence Chair to chair: Modified independence     GAIT: Gait pattern: step through pattern, decreased step length- Right, decreased stance time- Left, and decreased stride length Distance walked: 40' Assistive device utilized: None Level of assistance: Modified independence Comments: Pt ambulates with significant stiffness due to the hinged knee brace on the L LE as well as the stiffness that she has in her joints from the cancer.  FUNCTIONAL TESTS:  5 times sit to stand: 23.43 sec from elevated mat table; 4/17: 16.53sec from slightly elevated mat table   Timed up and go (TUG): 23.33 sec; 4/17: 13.61 average of 2 trials  2 minute walk test: 211 10 meter walk test: 18.33 sec Berg Balance Scale: 43/56; 4/17: 48/56     PATIENT SURVEYS:  FOTO 46/59  TODAY'S TREATMENT: DATE: 07/27/22    PT treatment re-assessed progress towards LTG for recertification.  10 Meter Walk Test: Patient instructed to walk 10 meters (32.8 ft) as quickly and as safely as possible at their normal speed x2 and at a fast speed x2. Time measured from 2 meter mark to 8 meter mark to accommodate ramp-up and ramp-down.  Normal speed 1: 9.19sec m/s Normal speed 2: 9.33sec m/s Average Normal speed: 9.26 1.98m/s  Cut off scores: <0.4 m/s = household Ambulator, 0.4-0.8 m/s = limited community Ambulator, >0.8 m/s = community Ambulator, >1.2 m/s = crossing a street, <1.0 = increased fall risk MCID 0.05 m/s (small), 0.13 m/s (moderate), 0.06 m/s (significant)  (ANPTA Core Set of Outcome Measures for Adults with Neurologic Conditions, 2018)  PT instructed pt in TUG: 9.84sec   (average of 3 trials; >13.5 sec indicates increased fall risk)  Patient demonstrates increased fall risk as noted by score of   /56 on Berg Balance Scale.  (<36= high risk for falls, close to 100%; 37-45 significant >80%; 46-51 moderate >50%; 52-55 lower >25%)     PATIENT EDUCATION: Education details: Pt educated throughout session about proper posture and technique with exercises. Improved exercise technique, movement at target joints, use of target muscles after min to mod verbal, visual, tactile cues. Person educated: Patient and Nurse, mental health method: Explanation Education comprehension: verbalized understanding  HOME EXERCISE PROGRAM:  Access Code: [email or web address] URL: [email or web address] Date: 05/19/2022 Prepared by: Grier Rocher  Exercises - Seated Hip Abduction with Resistance  - 1 x daily - 7 x weekly - 3 sets - 10 reps - Standing Hip Extension Kicks  - 1 x daily - 7 x weekly - 3 sets - 10 reps - Standing Hip Abduction Kicks  - 1 x daily - 7 x weekly - 3 sets - 10 reps  Access Code: [email or web address] URL: [email or web address] Date: 05/14/2022 Prepared by: Tomasa Hose  Exercises - Isometric Shoulder Flexion at Wall  - 1 x daily - 7 x weekly - 3 sets - 10 reps - Isometric Shoulder Extension at Wall  - 1 x daily - 7 x weekly - 3 sets - 10 reps - Isometric Shoulder Abduction at Wall  - 1 x daily - 7 x weekly - 3 sets - 10 reps - Isometric Shoulder Adduction  - 1 x daily - 7 x weekly - 3 sets - 10 reps   GOALS:  Goals reviewed with patient? Yes  SHORT TERM GOALS: Target date: 06/03/2022  Pt will be independent with HEP in order  to demonstrate increased ability to perform tasks related to occupation/hobbies. Baseline: Not given HEP at initial evaluation. Goal status: MET   LONG TERM GOALS: Target date: 07/29/2022  Patient will demonstrate improved function as evidenced by a score of 59 on FOTO measure for full participation in activities at  home and in the community. Baseline: Evaluation: 46, 4/17: 68. 5/13: 58 Goal status: in progress   2.  Patient to demonstrate increased cervical rotation ROM to be 65 deg in order to return to PLOF and improving safety while driving. Baseline: 16/26 deg 4/17 28/39 DEG 5/13 28/29 Goal status: IN PROGRESS  3.  Pt to improve B shoulder ROM to be WFL (Flexion: 120 deg; Abduction: 130 deg) in order to improve ability to complete tasks Baseline: R/L Shoulder Flex: 62/99; R/L Shoulder Scaption: 60/56 4/17: shoulder flexion 76/105 shoulder scaption 76/91 5/13 shoulder flexion 78/92 shoulder scaption 85/108.  Goal status: IN PROGRESS  4.  Pt to improve overall strength of the LE's in order to ambulate for longer distances and be able to tolerate surgery to repair the L knee. Baseline: Global 4/5 strength in the R LE, L LE MMT deferred due to the patella being broken at this time. 4/17 improved strength on the RLE and LLE with knee extension 4-/5, hip flexion and knee flexion 4/5 5/13: BLE 4 to 4+/5 except R hip flexion 4-/5    Goal status: IN PROGRESS   5.  Patient (> 60 years old) will complete five times sit to stand test in < 15 seconds indicating an increased LE strength and improved balance. Baseline: 23.43 sec 4/17: 16.53sec from slightly elevatedd mat table  5/13: 13.72 sec with 1 inch pad in seat.  Goal status: in progress.    6.  Patient will reduce timed up and go to <11 seconds to reduce fall risk and demonstrate improved transfer/gait ability. Baseline: 23.33 sec 4/17: 13.61 average of 2 trials. 5/13 9.86 sec   Goal status: MET  7.  Pt will improve BERG by at least 3 points in order to demonstrate clinically significant improvement in balance.  Baseline: 43 4/17: 48/56 07/27/2022 49/56  Goal status: MET     ASSESSMENT:  CLINICAL IMPRESSION:  Pt put forth good effort in PT treatment. PT treatment focused on dynamic balance through completion of standardized outcome measures.  Noted improvement in TUG to <10 sec with no AD, increase in berg by 1 point, improved 5xSTS in normalized sitting surface, as well as increased strength and ROM in Bil shoulders and cspine. Patient's condition has the potential to improve in response to therapy. Maximum improvement is yet to be obtained. The anticipated improvement is attainable and reasonable in a generally predictable time.  Pt will continue to benefit from skilled therapy to address remaining deficits in order to improve overall QoL and return to PLOF.       OBJECTIVE IMPAIRMENTS: Abnormal gait, decreased activity tolerance, decreased balance, decreased endurance, decreased knowledge of use of DME, decreased mobility, difficulty walking, decreased ROM, decreased strength, hypomobility, impaired flexibility, impaired UE functional use, and pain  ACTIVITY LIMITATIONS: carrying, lifting, bending, sitting, standing, squatting, stairs, transfers, bed mobility, bathing, toileting, dressing, reach over head, hygiene/grooming, and locomotion level  PARTICIPATION LIMITATIONS: meal prep, cleaning, laundry, driving, shopping, community activity, occupation, and yard work  PERSONAL FACTORS: Time since onset of injury/illness/exacerbation and 3+ comorbidities: Hx of breast cancer, bone cancer, thyroid disease  are also affecting patient's functional outcome.   REHAB POTENTIAL: Fair due to multiple  joints needing to be targeted and pt's current bone cancer that is causing her joints to stiffen and become more rigid.    CLINICAL DECISION MAKING: Evolving/moderate complexity  EVALUATION COMPLEXITY: Moderate  PLAN:  PT FREQUENCY: 2x/week  PT DURATION: 12 weeks  PLANNED INTERVENTIONS: Therapeutic exercises, Therapeutic activity, Neuromuscular re-education, Balance training, Gait training, Patient/Family education, Self Care, Joint mobilization, Stair training, Vestibular training, Canalith repositioning, DME instructions, Aquatic Therapy,  Dry Needling, Spinal mobilization, Cryotherapy, Moist heat, Manual therapy, and Re-evaluation  PLAN FOR NEXT SESSION:    Allow pt identifies as most pressing issue (shoulder vs neck, vs LE) and treat with strengthening or stretching program to assist with ROM and pain modulation.    Grier Rocher PT, DPT  Physical Therapist - [facility]  07/27/22, 2:33 PM

## 2022-07-28 ENCOUNTER — Other Ambulatory Visit: Payer: Self-pay | Admitting: Family Medicine

## 2022-07-28 DIAGNOSIS — K219 Gastro-esophageal reflux disease without esophagitis: Secondary | ICD-10-CM

## 2022-07-29 ENCOUNTER — Other Ambulatory Visit: Payer: Commercial Managed Care - PPO

## 2022-07-29 DIAGNOSIS — Z515 Encounter for palliative care: Secondary | ICD-10-CM

## 2022-07-29 NOTE — Progress Notes (Signed)
TELEPHONE ENCOUNTER   Palliative care SW connected with patient to cancel/reschedule [facility] RN visit.  Patient shared that she is doing well. Patient recently saw her PCP and addressed any medical concerns. Patient continues to have private caregiver present. no other concerns or needs vocied during call.   Plan: In home PC visit scheduled in 3-4 weeks.

## 2022-07-30 ENCOUNTER — Telehealth: Payer: Self-pay

## 2022-07-30 ENCOUNTER — Ambulatory Visit: Payer: Commercial Managed Care - PPO | Admitting: Physical Therapy

## 2022-07-30 NOTE — Telephone Encounter (Signed)
Received pathology report from [facility] with result of metastatic adenocarcinoma. Called the center at [phone] and LVM requesting breast prognostic panel that includes estrogen, progesterone, and HER2 status per MD. Asked for call back.    Returned Pt's call regarding above mentioned results. Advised Pt that we are ordering breast prognostic panel to assure we are treating appropriately and that results can take up to a week. Stated that we will be back in touch with Pt when we have results from panel. Pt verbalized understanding.

## 2022-07-31 ENCOUNTER — Ambulatory Visit: Payer: Commercial Managed Care - PPO | Admitting: Physical Therapy

## 2022-07-31 ENCOUNTER — Telehealth: Payer: Self-pay | Admitting: Pharmacist

## 2022-07-31 DIAGNOSIS — M25562 Pain in left knee: Secondary | ICD-10-CM

## 2022-07-31 DIAGNOSIS — M6281 Muscle weakness (generalized): Secondary | ICD-10-CM

## 2022-07-31 DIAGNOSIS — M542 Cervicalgia: Secondary | ICD-10-CM

## 2022-07-31 DIAGNOSIS — M25611 Stiffness of right shoulder, not elsewhere classified: Secondary | ICD-10-CM

## 2022-07-31 DIAGNOSIS — M436 Torticollis: Secondary | ICD-10-CM

## 2022-07-31 DIAGNOSIS — M25612 Stiffness of left shoulder, not elsewhere classified: Secondary | ICD-10-CM

## 2022-07-31 DIAGNOSIS — R29898 Other symptoms and signs involving the musculoskeletal system: Secondary | ICD-10-CM

## 2022-07-31 DIAGNOSIS — R262 Difficulty in walking, not elsewhere classified: Secondary | ICD-10-CM | POA: Diagnosis not present

## 2022-07-31 NOTE — Therapy (Signed)
OUTPATIENT PHYSICAL THERAPY NEURO TREATMENT/    Patient Name: Kathryn [facility] MRN: [id number] DOB:[birth year], 62 y.o., female Today's Date: 07/31/2022   PCP: Ardean Larsen, MD REFERRING PROVIDER: [name], MD  END OF SESSION:  PT End of Session - 07/31/22 0811     Visit Number 17    Number of Visits 40    Date for PT Re-Evaluation 10/19/22    Authorization Type [employer]    Progress Note Due on Visit 20    PT Start Time 0806    PT Stop Time 0845    PT Time Calculation (min) 39 min    Equipment Utilized During Treatment Gait belt    Activity Tolerance Patient tolerated treatment well;Patient limited by pain    Behavior During Therapy [facility] for tasks assessed/performed                  Past Medical History:  Diagnosis Date   Breast cancer (HCC)    left breast cancer   Cancer (HCC) 09/2017   left breast cancer   Family history of breast cancer    Hypothyroidism    Personal history of radiation therapy 2019   Thyroid disease    Past Surgical History:  Procedure Laterality Date   BREAST BIOPSY Left 2018   BREAST BIOPSY Left 2019   BREAST RECONSTRUCTION WITH PLACEMENT OF TISSUE EXPANDER AND ALLODERM Left 10/26/2017   Procedure: LEFT BREAST RECONSTRUCTION WITH PLACEMENT OF TISSUE EXPANDER AND ALLODERM;  Surgeon: Glenna Fellows, MD;  Location: [facility];  Service: Plastics;  Laterality: Left;   MASTECTOMY Left 2019   MASTECTOMY WITH RADIOACTIVE SEED GUIDED EXCISION AND AXILLARY SENTINEL LYMPH NODE BIOPSY Left 10/26/2017   Procedure: LEFT MASTECTOMY WITH SEED TARGETED  LEFT AXILLARY LYMPH NODE EXCISION AND LEFT SENTINEL LYMPH NODE BIOPSY;  Surgeon: Almond Lint, MD;  Location:  SURGERY CENTER;  Service: General;  Laterality: Left;   TONSILLECTOMY     WISDOM TOOTH EXTRACTION     Patient Active Problem List   Diagnosis Date Noted   Metastatic malignant neoplasm (HCC) 12/19/2021   Family history of breast cancer    History of  therapeutic radiation 04/27/2018   Breast cancer, left breast (HCC) 10/26/2017   Malignant neoplasm of lower-outer quadrant of left breast of female, estrogen receptor positive (HCC) 07/15/2016   Plantar fasciitis 07/30/2015   Metatarsalgia 10/18/2014    ONSET DATE: September 2023  REFERRING DIAG: C79.51 (ICD-10-CM) - Malignant neoplasm metastatic to bone (HCC)   THERAPY DIAG:  Difficulty in walking, not elsewhere classified  Muscle weakness (generalized)  Acute pain of left knee  Leg weakness, bilateral  Decreased range of motion of right shoulder  Decreased range of motion of left shoulder  Painful cervical ROM  Stiffness of cervical spine  Rationale for Evaluation and Treatment: Rehabilitation  SUBJECTIVE:  SUBJECTIVE STATEMENT:  Pt reports increased cervical tightness on this day. Requesting to focus treatment on BUE ROM and cervical pain on this day. Pt does report that she has improved tolerance to standing in home while performing ADLs and iADLs     Pt accompanied by: self  PERTINENT HISTORY:   Pt is 62 y.o. female that is seeking therapy for weakness of the R LE.  Pt tripped over the threshold of her door back in September of 2023, and had a transverse fracture of the L patella when she fell on it.  She was recently diagnosed with bone cancer back in October of 2023.  Pt notes that she has more stiffness in the neck and pelvis region because the cancer is located in those locations.  Pt is currently utilizing a hinged knee brace that she received from a friend for support of the L knee.  Secure Chat with referring provider [name], MD, and asked for clarification on any precautions were present due to the pt's cancer diagnosis  Pt is cleared to have modalities performed  including dry needling and heat applied per MD.   PAIN:  Are you having pain? No  PRECAUTIONS: Other: Cancer  WEIGHT BEARING RESTRICTIONS: Pt denies having any weightbearing restrictions on the knee  FALLS: Has patient fallen in last 6 months? Yes. Number of falls 1  LIVING ENVIRONMENT: Lives with: lives alone Lives in: House/apartment Stairs: No Has following equipment at home: Dan Humphreys - 4 wheeled, shower chair, and elevated toilet with bars on the side.  Pt is looking at putting in grab bars in the shower, but has assistant that helps with showers.  PLOF: Independent  PATIENT GOALS: To get stronger, be able to walk better.    OBJECTIVE:   DIAGNOSTIC FINDINGS:   CLINICAL DATA:  Left knee pain   EXAM: LEFT KNEE - 1-2 VIEW  COMPARISON:  October 2022  FINDINGS: Nonunion of left patellar fracture. No new acute fracture. Degenerative changes of the knee demonstrated by subchondral lucencies of the distal femur. Visualized soft tissues are unremarkable.  IMPRESSION: Nonunion of left patellar fracture.   COGNITION: Overall cognitive status: Within functional limits for tasks assessed   SENSATION: WFL   POSTURE: No Significant postural limitations.  Pt is however unable to rotate her neck or move her hips as much due to the cancer in the pt.    CERVICAL ROM:   Active ROM A/PROM (deg) eval AROM 4/17   Flexion 36   Extension 16   Right lateral flexion 11   Left lateral flexion 10   Right rotation 16 28  Left rotation 26 39    (Blank rows = not tested)   LOWER EXTREMITY ROM:     Active  Right Eval Left Eval  Knee flexion 85 deg deferred  Knee extension 17 deg lacking  deferred    LOWER EXTREMITY MMT:    MMT Right Eval Left Eval  Hip flexion 4+ 4  Knee flexion 4+ 4  Knee extension 4+ 4-    UPPER EXTREMITY ROM:  Active ROM Right eval Left eval R 4/17 L 4/17   Shoulder flexion 62 deg 99 deg 76 105   Shoulder scaption 60 deg 56 deg 76  91     UPPER EXTREMITY MMT:  MMT Right eval Left eval  Shoulder flexion 3+ with pain 4  Shoulder abduction 3+ with pain 4  Grip strength 28# 38#     BED MOBILITY:  Supine to sit Modified  independence, extra time necessary.   TRANSFERS: Assistive device utilized: None  Sit to stand: Modified independence Stand to sit: Modified independence Chair to chair: Modified independence     GAIT: Gait pattern: step through pattern, decreased step length- Right, decreased stance time- Left, and decreased stride length Distance walked: 40' Assistive device utilized: None Level of assistance: Modified independence Comments: Pt ambulates with significant stiffness due to the hinged knee brace on the L LE as well as the stiffness that she has in her joints from the cancer.  FUNCTIONAL TESTS:  5 times sit to stand: 23.43 sec from elevated mat table; 4/17: 16.53sec from slightly elevated mat table   Timed up and go (TUG): 23.33 sec; 4/17: 13.61 average of 2 trials  2 minute walk test: 211 10 meter walk test: 18.33 sec Berg Balance Scale: 43/56; 4/17: 48/56     PATIENT SURVEYS:  FOTO 46/59  TODAY'S TREATMENT: DATE: 07/31/22   Nustep BUE/BLE reciprocal movement and AAROM endurance training level 2 x 5 min. Cues to keep BLE in pain free range throughout. Noted to have improved ROM in L shoulder with increased time on nustep.   Manual therapy cervical STM to suboccipitals and paraspinals x 10 min.  Cervical rotation with overpressure while PT performs STM to stretched muscles in cervical region 2 x 2 min each .  BUE PROM and AROM shoulder flexion, scaption and abduction  x 45 sec each bil.   Sit<>stand from declining height from 28inch down to 22 inches without support.  Performed x 8 total. Noted to have increased difficulty at lowest height   Pt reports significantly reduced cervical stiffness at end of session.     PATIENT EDUCATION: Education details: Pt educated throughout  session about proper posture and technique with exercises. Improved exercise technique, movement at target joints, use of target muscles after min to mod verbal, visual, tactile cues. Person educated: Patient Education method: Explanation Education comprehension: verbalized understanding  HOME EXERCISE PROGRAM:  Access Code: [email or web address] URL: [email or web address] Date: 05/19/2022 Prepared by: Grier Rocher  Exercises - Seated Hip Abduction with Resistance  - 1 x daily - 7 x weekly - 3 sets - 10 reps - Standing Hip Extension Kicks  - 1 x daily - 7 x weekly - 3 sets - 10 reps - Standing Hip Abduction Kicks  - 1 x daily - 7 x weekly - 3 sets - 10 reps  Access Code: [email or web address] URL: [email or web address] Date: 05/14/2022 Prepared by: Tomasa Hose  Exercises - Isometric Shoulder Flexion at Wall  - 1 x daily - 7 x weekly - 3 sets - 10 reps - Isometric Shoulder Extension at Wall  - 1 x daily - 7 x weekly - 3 sets - 10 reps - Isometric Shoulder Abduction at Wall  - 1 x daily - 7 x weekly - 3 sets - 10 reps - Isometric Shoulder Adduction  - 1 x daily - 7 x weekly - 3 sets - 10 reps   GOALS:  Goals reviewed with patient? Yes  SHORT TERM GOALS: Target date: 06/03/2022  Pt will be independent with HEP in order to demonstrate increased ability to perform tasks related to occupation/hobbies. Baseline: Not given HEP at initial evaluation. Goal status: MET   LONG TERM GOALS: Target date: 07/29/2022  Patient will demonstrate improved function as evidenced by a score of 59 on FOTO measure for full participation in activities at home and in the community. Baseline: Evaluation: 46, 4/17: 68. 5/13: 58 Goal status: in  progress   2.  Patient to demonstrate increased cervical rotation ROM to be 65 deg in order to return to PLOF and improving safety while driving. Baseline: 16/26 deg 4/17 28/39 DEG 5/13 28/29 Goal status: IN PROGRESS  3.  Pt to improve B shoulder ROM to  be WFL (Flexion: 120 deg; Abduction: 130 deg) in order to improve ability to complete tasks Baseline: R/L Shoulder Flex: 62/99; R/L Shoulder Scaption: 60/56 4/17: shoulder flexion 76/105 shoulder scaption 76/91 5/13 shoulder flexion 78/92 shoulder scaption 85/108.  Goal status: IN PROGRESS  4.  Pt to improve overall strength of the LE's in order to ambulate for longer distances and be able to tolerate surgery to repair the L knee. Baseline: Global 4/5 strength in the R LE, L LE MMT deferred due to the patella being broken at this time. 4/17 improved strength on the RLE and LLE with knee extension 4-/5, hip flexion and knee flexion 4/5 5/13: BLE 4 to 4+/5 except R hip flexion 4-/5    Goal status: IN PROGRESS   5.  Patient (> 9 years old) will complete five times sit to stand test in < 15 seconds indicating an increased LE strength and improved balance. Baseline: 23.43 sec 4/17: 16.53sec from slightly elevatedd mat table  5/13: 13.72 sec with 1 inch pad in seat.  Goal status: in progress.    6.  Patient will reduce timed up and go to <11 seconds to reduce fall risk and demonstrate improved transfer/gait ability. Baseline: 23.33 sec 4/17: 13.61 average of 2 trials. 5/13 9.86 sec   Goal status: MET  7.  Pt will improve BERG by at least 3 points in order to demonstrate clinically significant improvement in balance.  Baseline: 43 4/17: 48/56 07/27/2022 49/56  Goal status: MET     ASSESSMENT:  CLINICAL IMPRESSION:  Pt put forth good effort in PT treatment. PT treatment focused on cervical ROM, muscle tightness as well as shoulder ROM. Pt reports decrease stiffness in the cervical spine and improved ROM in Bil shoulders following manual therapy from PT. Pt also noted to have improved independence in standing from lower seat height with decreased UE support  Pt will continue to benefit from skilled therapy to address remaining deficits in order to improve overall QoL and return to PLOF.        OBJECTIVE IMPAIRMENTS: Abnormal gait, decreased activity tolerance, decreased balance, decreased endurance, decreased knowledge of use of DME, decreased mobility, difficulty walking, decreased ROM, decreased strength, hypomobility, impaired flexibility, impaired UE functional use, and pain  ACTIVITY LIMITATIONS: carrying, lifting, bending, sitting, standing, squatting, stairs, transfers, bed mobility, bathing, toileting, dressing, reach over head, hygiene/grooming, and locomotion level  PARTICIPATION LIMITATIONS: meal prep, cleaning, laundry, driving, shopping, community activity, occupation, and yard work  PERSONAL FACTORS: Time since onset of injury/illness/exacerbation and 3+ comorbidities: Hx of breast cancer, bone cancer, thyroid disease  are also affecting patient's functional outcome.   REHAB POTENTIAL: Fair due to multiple joints needing to be targeted and pt's current bone cancer that is causing her joints to stiffen and become more rigid.    CLINICAL DECISION MAKING: Evolving/moderate complexity  EVALUATION COMPLEXITY: Moderate  PLAN:  PT FREQUENCY: 2x/week  PT DURATION: 12 weeks  PLANNED INTERVENTIONS: Therapeutic exercises, Therapeutic activity, Neuromuscular re-education, Balance training, Gait training, Patient/Family education, Self Care, Joint mobilization, Stair training, Vestibular training, Canalith repositioning, DME instructions, Aquatic Therapy, Dry Needling, Spinal mobilization, Cryotherapy, Moist heat, Manual therapy, and Re-evaluation  PLAN FOR NEXT SESSION:  Allow pt identifies as most pressing issue (shoulder vs neck, vs LE) and treat with strengthening or stretching program to assist with ROM and pain modulation.    Grier Rocher PT, DPT  Physical Therapist - [facility]  07/31/22, 8:48 AM

## 2022-07-31 NOTE — Progress Notes (Signed)
Pathologist Dr. Laurine Blazer of [facility] reports that they do not want to run breast diagnostic panel themselves as they do not have a breast pathologist. Dr. Laurine Blazer requesting that we fax a slide request form so that we may acquire the slides and run the panel ourselves. Spoke with [facility], [facility], and [facility] pathology teams all of whom relayed they do not know if they can acquire the slides or run the panel. Emailed [place] [facility] [place] histology and included Marianne Sofia, RN, Raquel Sarna, RN, and Dr. Pamelia Hoit on next best steps.

## 2022-08-03 ENCOUNTER — Ambulatory Visit: Payer: Commercial Managed Care - PPO | Admitting: Physical Therapy

## 2022-08-03 DIAGNOSIS — M25612 Stiffness of left shoulder, not elsewhere classified: Secondary | ICD-10-CM

## 2022-08-03 DIAGNOSIS — M25562 Pain in left knee: Secondary | ICD-10-CM

## 2022-08-03 DIAGNOSIS — M542 Cervicalgia: Secondary | ICD-10-CM

## 2022-08-03 DIAGNOSIS — R262 Difficulty in walking, not elsewhere classified: Secondary | ICD-10-CM

## 2022-08-03 DIAGNOSIS — M436 Torticollis: Secondary | ICD-10-CM

## 2022-08-03 DIAGNOSIS — M25611 Stiffness of right shoulder, not elsewhere classified: Secondary | ICD-10-CM

## 2022-08-03 DIAGNOSIS — M6281 Muscle weakness (generalized): Secondary | ICD-10-CM

## 2022-08-03 DIAGNOSIS — R29898 Other symptoms and signs involving the musculoskeletal system: Secondary | ICD-10-CM

## 2022-08-03 NOTE — Therapy (Signed)
OUTPATIENT PHYSICAL THERAPY NEURO TREATMENT/    Patient Name: Kathryn Lucas MRN: [id number] DOB:January 16, 1961, 62 y.o., female Today's Date: 08/03/2022   PCP: Ardean Larsen, MD REFERRING PROVIDER: [name], MD  END OF SESSION:  PT End of Session - 08/03/22 1521     Visit Number 18    Number of Visits 40    Date for PT Re-Evaluation 10/19/22    Authorization Type [employer]    Progress Note Due on Visit 20    PT Start Time 1519    PT Stop Time 1601    PT Time Calculation (min) 42 min    Equipment Utilized During Treatment Gait belt    Activity Tolerance Patient tolerated treatment well;Patient limited by pain    Behavior During Therapy WFL for tasks assessed/performed                  Past Medical History:  Diagnosis Date   Breast cancer (HCC)    left breast cancer   Cancer (HCC) 09/2017   left breast cancer   Family history of breast cancer    Hypothyroidism    Personal history of radiation therapy 2019   Thyroid disease    Past Surgical History:  Procedure Laterality Date   BREAST BIOPSY Left 2018   BREAST BIOPSY Left 2019   BREAST RECONSTRUCTION WITH PLACEMENT OF TISSUE EXPANDER AND ALLODERM Left 10/26/2017   Procedure: LEFT BREAST RECONSTRUCTION WITH PLACEMENT OF TISSUE EXPANDER AND ALLODERM;  Surgeon: Glenna Fellows, MD;  Location: [facility];  Service: Plastics;  Laterality: Left;   MASTECTOMY Left 2019   MASTECTOMY WITH RADIOACTIVE SEED GUIDED EXCISION AND AXILLARY SENTINEL LYMPH NODE BIOPSY Left 10/26/2017   Procedure: LEFT MASTECTOMY WITH SEED TARGETED  LEFT AXILLARY LYMPH NODE EXCISION AND LEFT SENTINEL LYMPH NODE BIOPSY;  Surgeon: Almond Lint, MD;  Location: [facility];  Service: General;  Laterality: Left;   TONSILLECTOMY     WISDOM TOOTH EXTRACTION     Patient Active Problem List   Diagnosis Date Noted   Metastatic malignant neoplasm (HCC) 12/19/2021   Family history of breast cancer    History of  therapeutic radiation 04/27/2018   Breast cancer, left breast (HCC) 10/26/2017   Malignant neoplasm of lower-outer quadrant of left breast of female, estrogen receptor positive (HCC) 07/15/2016   Plantar fasciitis 07/30/2015   Metatarsalgia 10/18/2014    ONSET DATE: September 2023  REFERRING DIAG: C79.51 (ICD-10-CM) - Malignant neoplasm metastatic to bone (HCC)   THERAPY DIAG:  Difficulty in walking, not elsewhere classified  Muscle weakness (generalized)  Decreased range of motion of right shoulder  Acute pain of left knee  Decreased range of motion of left shoulder  Stiffness of cervical spine  Painful cervical ROM  Leg weakness, bilateral  Rationale for Evaluation and Treatment: Rehabilitation  SUBJECTIVE:  SUBJECTIVE STATEMENT:  Pt reports that she is doing well, but still feels limited by fatigue and SOB with longer distances.     Pt accompanied by: self  PERTINENT HISTORY:   Pt is 62 y.o. female that is seeking therapy for weakness of the R LE.  Pt tripped over the threshold of her door back in September of 2023, and had a transverse fracture of the L patella when she fell on it.  She was recently diagnosed with bone cancer back in October of 2023.  Pt notes that she has more stiffness in the neck and pelvis region because the cancer is located in those locations.  Pt is currently utilizing a hinged knee brace that she received from a friend for support of the L knee.  Secure Chat with referring provider [name], MD, and asked for clarification on any precautions were present due to the pt's cancer diagnosis  Pt is cleared to have modalities performed including dry needling and heat applied per MD.   PAIN:  Are you having pain? No  PRECAUTIONS: Other: Cancer  WEIGHT  BEARING RESTRICTIONS: Pt denies having any weightbearing restrictions on the knee  FALLS: Has patient fallen in last 6 months? Yes. Number of falls 1  LIVING ENVIRONMENT: Lives with: lives alone Lives in: House/apartment Stairs: No Has following equipment at home: Dan Humphreys - 4 wheeled, shower chair, and elevated toilet with bars on the side.  Pt is looking at putting in grab bars in the shower, but has assistant that helps with showers.  PLOF: Independent  PATIENT GOALS: To get stronger, be able to walk better.    OBJECTIVE:   DIAGNOSTIC FINDINGS:   CLINICAL DATA:  Left knee pain   EXAM: LEFT KNEE - 1-2 VIEW  COMPARISON:  October 2022  FINDINGS: Nonunion of left patellar fracture. No new acute fracture. Degenerative changes of the knee demonstrated by subchondral lucencies of the distal femur. Visualized soft tissues are unremarkable.  IMPRESSION: Nonunion of left patellar fracture.   COGNITION: Overall cognitive status: Within functional limits for tasks assessed   SENSATION: WFL   POSTURE: No Significant postural limitations.  Pt is however unable to rotate her neck or move her hips as much due to the cancer in the pt.    CERVICAL ROM:   Active ROM A/PROM (deg) eval AROM 4/17   Flexion 36   Extension 16   Right lateral flexion 11   Left lateral flexion 10   Right rotation 16 28  Left rotation 26 39    (Blank rows = not tested)   LOWER EXTREMITY ROM:     Active  Right Eval Left Eval  Knee flexion 85 deg deferred  Knee extension 17 deg lacking  deferred    LOWER EXTREMITY MMT:    MMT Right Eval Left Eval  Hip flexion 4+ 4  Knee flexion 4+ 4  Knee extension 4+ 4-    UPPER EXTREMITY ROM:  Active ROM Right eval Left eval R 4/17 L 4/17   Shoulder flexion 62 deg 99 deg 76 105   Shoulder scaption 60 deg 56 deg 76  91    UPPER EXTREMITY MMT:  MMT Right eval Left eval  Shoulder flexion 3+ with pain 4  Shoulder abduction 3+ with pain 4   Grip strength 28# 38#     BED MOBILITY:  Supine to sit Modified independence, extra time necessary.   TRANSFERS: Assistive device utilized: None  Sit to stand: Modified independence Stand to sit:  Modified independence Chair to chair: Modified independence     GAIT: Gait pattern: step through pattern, decreased step length- Right, decreased stance time- Left, and decreased stride length Distance walked: 40' Assistive device utilized: None Level of assistance: Modified independence Comments: Pt ambulates with significant stiffness due to the hinged knee brace on the L LE as well as the stiffness that she has in her joints from the cancer.  FUNCTIONAL TESTS:  5 times sit to stand: 23.43 sec from elevated mat table; 4/17: 16.53sec from slightly elevated mat table   Timed up and go (TUG): 23.33 sec; 4/17: 13.61 average of 2 trials  2 minute walk test: 211 10 meter walk test: 18.33 sec Berg Balance Scale: 43/56; 4/17: 48/56     PATIENT SURVEYS:  FOTO 46/59  TODAY'S TREATMENT: DATE: 08/03/22   Resting HR 110 at start of PT session.  Gait training in simulated community environment through hospital hall way, cafeteria and sidewalk leading to and from cancer heeling garden. Performed 5 bouts >641ft each. Min cues for safety through healing garden, around corners and up down access ramps. Pt noted to have min-moderate SOB at end of each bout with HR reading at 138-140bpm. Pt required 4:37min to 5:65min therapeutic rest breaks for HR to return to <115 with instruction for pursed lip breathing to allow HR to reduce.   Pt performed sit<>stand from arm chair and rocking chair at 17-19 min with CGA and UE to push from seat. Pt attempted push from thighs, but unable to weight shift forward adequately from standard seat height.  Additional sit<>stand 2 x 5 with UE pushing from chair with supervision assist from PT for safety.   PATIENT EDUCATION: Education details: Pt educated  throughout session about proper posture and technique with exercises. Improved exercise technique, movement at target joints, use of target muscles after min to mod verbal, visual, tactile cues. Person educated: Patient Education method: Explanation Education comprehension: verbalized understanding  HOME EXERCISE PROGRAM:  Access Code: [email or web address] URL: [email or web address] Date: 05/19/2022 Prepared by: Grier Rocher  Exercises - Seated Hip Abduction with Resistance  - 1 x daily - 7 x weekly - 3 sets - 10 reps - Standing Hip Extension Kicks  - 1 x daily - 7 x weekly - 3 sets - 10 reps - Standing Hip Abduction Kicks  - 1 x daily - 7 x weekly - 3 sets - 10 reps  Access Code: [email or web address] URL: [email or web address] Date: 05/14/2022 Prepared by: Tomasa Hose  Exercises - Isometric Shoulder Flexion at Wall  - 1 x daily - 7 x weekly - 3 sets - 10 reps - Isometric Shoulder Extension at Wall  - 1 x daily - 7 x weekly - 3 sets - 10 reps - Isometric Shoulder Abduction at Wall  - 1 x daily - 7 x weekly - 3 sets - 10 reps - Isometric Shoulder Adduction  - 1 x daily - 7 x weekly - 3 sets - 10 reps   GOALS:  Goals reviewed with patient? Yes  SHORT TERM GOALS: Target date: 06/03/2022  Pt will be independent with HEP in order to demonstrate increased ability to perform tasks related to occupation/hobbies. Baseline: Not given HEP at initial evaluation. Goal status: MET   LONG TERM GOALS: Target date: 07/29/2022  Patient will demonstrate improved function as evidenced by a score of 59 on FOTO measure for full participation in activities at home and in the community. Baseline: Evaluation: 46, 4/17: 68. 5/13: 58 Goal status: in progress  2.  Patient to demonstrate increased cervical rotation ROM to be 65 deg in order to return to PLOF and improving safety while driving. Baseline: 16/26 deg 4/17 28/39 DEG 5/13 28/29 Goal status: IN PROGRESS  3.  Pt to improve B  shoulder ROM to be WFL (Flexion: 120 deg; Abduction: 130 deg) in order to improve ability to complete tasks Baseline: R/L Shoulder Flex: 62/99; R/L Shoulder Scaption: 60/56 4/17: shoulder flexion 76/105 shoulder scaption 76/91 5/13 shoulder flexion 78/92 shoulder scaption 85/108.  Goal status: IN PROGRESS  4.  Pt to improve overall strength of the LE's in order to ambulate for longer distances and be able to tolerate surgery to repair the L knee. Baseline: Global 4/5 strength in the R LE, L LE MMT deferred due to the patella being broken at this time. 4/17 improved strength on the RLE and LLE with knee extension 4-/5, hip flexion and knee flexion 4/5 5/13: BLE 4 to 4+/5 except R hip flexion 4-/5    Goal status: IN PROGRESS   5.  Patient (> 33 years old) will complete five times sit to stand test in < 15 seconds indicating an increased LE strength and improved balance. Baseline: 23.43 sec 4/17: 16.53sec from slightly elevatedd mat table  5/13: 13.72 sec with 1 inch pad in seat.  Goal status: in progress.    6.  Patient will reduce timed up and go to <11 seconds to reduce fall risk and demonstrate improved transfer/gait ability. Baseline: 23.33 sec 4/17: 13.61 average of 2 trials. 5/13 9.86 sec   Goal status: MET  7.  Pt will improve BERG by at least 3 points in order to demonstrate clinically significant improvement in balance.  Baseline: 43 4/17: 48/56 07/27/2022 49/56  Goal status: MET     ASSESSMENT:  CLINICAL IMPRESSION:  Pt put forth good effort in PT treatment. PT treatment focused on dynamic gait and endurance training through simulated community mobility training. Noted to have increased HR to ~140bpm requiring 5 min therapeutic rest break to return to near baseline. No LOB or stumbles on unlevel cement sidewalk throughout community gait training. Min assist continues to be required from standard and low seat heights with UE support. Pt will continue to benefit from skilled  therapy to address remaining deficits in order to improve overall QoL and return to PLOF.       OBJECTIVE IMPAIRMENTS: Abnormal gait, decreased activity tolerance, decreased balance, decreased endurance, decreased knowledge of use of DME, decreased mobility, difficulty walking, decreased ROM, decreased strength, hypomobility, impaired flexibility, impaired UE functional use, and pain  ACTIVITY LIMITATIONS: carrying, lifting, bending, sitting, standing, squatting, stairs, transfers, bed mobility, bathing, toileting, dressing, reach over head, hygiene/grooming, and locomotion level  PARTICIPATION LIMITATIONS: meal prep, cleaning, laundry, driving, shopping, community activity, occupation, and yard work  PERSONAL FACTORS: Time since onset of injury/illness/exacerbation and 3+ comorbidities: Hx of breast cancer, bone cancer, thyroid disease  are also affecting patient's functional outcome.   REHAB POTENTIAL: Fair due to multiple joints needing to be targeted and pt's current bone cancer that is causing her joints to stiffen and become more rigid.    CLINICAL DECISION MAKING: Evolving/moderate complexity  EVALUATION COMPLEXITY: Moderate  PLAN:  PT FREQUENCY: 2x/week  PT DURATION: 12 weeks  PLANNED INTERVENTIONS: Therapeutic exercises, Therapeutic activity, Neuromuscular re-education, Balance training, Gait training, Patient/Family education, Self Care, Joint mobilization, Stair training, Vestibular training, Canalith repositioning, DME instructions, Aquatic Therapy, Dry Needling, Spinal mobilization, Cryotherapy, Moist heat, Manual therapy, and Re-evaluation  PLAN  FOR NEXT SESSION:    Allow pt identifies as most pressing issue (shoulder vs neck, vs LE) and treat with strengthening or stretching program to assist with ROM and pain modulation.    Grier Rocher PT, DPT  Physical Therapist - [facility]  08/03/22, 4:49 PM

## 2022-08-04 ENCOUNTER — Ambulatory Visit
Admission: RE | Admit: 2022-08-04 | Discharge: 2022-08-04 | Disposition: A | Discharge status: Home / Self Care | Payer: Commercial Managed Care - PPO | Source: Ambulatory Visit | Attending: Family Medicine | Admitting: Family Medicine

## 2022-08-04 ENCOUNTER — Encounter: Payer: Self-pay | Admitting: Family Medicine

## 2022-08-04 ENCOUNTER — Encounter: Payer: Self-pay | Admitting: Hematology and Oncology

## 2022-08-04 DIAGNOSIS — K219 Gastro-esophageal reflux disease without esophagitis: Secondary | ICD-10-CM | POA: Insufficient documentation

## 2022-08-05 ENCOUNTER — Encounter: Payer: Self-pay | Admitting: Physical Therapy

## 2022-08-05 ENCOUNTER — Ambulatory Visit: Payer: Commercial Managed Care - PPO | Admitting: Physical Therapy

## 2022-08-05 DIAGNOSIS — M6281 Muscle weakness (generalized): Secondary | ICD-10-CM

## 2022-08-05 DIAGNOSIS — R262 Difficulty in walking, not elsewhere classified: Secondary | ICD-10-CM

## 2022-08-05 DIAGNOSIS — M25611 Stiffness of right shoulder, not elsewhere classified: Secondary | ICD-10-CM

## 2022-08-05 DIAGNOSIS — M436 Torticollis: Secondary | ICD-10-CM

## 2022-08-05 DIAGNOSIS — M542 Cervicalgia: Secondary | ICD-10-CM

## 2022-08-05 DIAGNOSIS — M25612 Stiffness of left shoulder, not elsewhere classified: Secondary | ICD-10-CM

## 2022-08-05 NOTE — Therapy (Signed)
OUTPATIENT PHYSICAL THERAPY NEURO TREATMENT/    Patient Name: Kathryn Lucas MRN: [id number] DOB:04-Jan-1961, 62 y.o., female Today's Date: 08/05/2022   PCP: Ardean Larsen, MD REFERRING PROVIDER: [name], MD  END OF SESSION:  PT End of Session - 08/05/22 1605     Visit Number 19    Number of Visits 40    Date for PT Re-Evaluation 10/19/22    Authorization Type [employer]    Progress Note Due on Visit 20    PT Start Time 1353    PT Stop Time 1430    PT Time Calculation (min) 37 min    Equipment Utilized During Treatment Gait belt    Activity Tolerance Patient tolerated treatment well;Patient limited by pain    Behavior During Therapy WFL for tasks assessed/performed                   Past Medical History:  Diagnosis Date   Breast cancer (HCC)    left breast cancer   Cancer (HCC) 09/2017   left breast cancer   Family history of breast cancer    Hypothyroidism    Personal history of radiation therapy 2019   Thyroid disease    Past Surgical History:  Procedure Laterality Date   BREAST BIOPSY Left 2018   BREAST BIOPSY Left 2019   BREAST RECONSTRUCTION WITH PLACEMENT OF TISSUE EXPANDER AND ALLODERM Left 10/26/2017   Procedure: LEFT BREAST RECONSTRUCTION WITH PLACEMENT OF TISSUE EXPANDER AND ALLODERM;  Surgeon: Glenna Fellows, MD;  Location: [facility];  Service: Plastics;  Laterality: Left;   MASTECTOMY Left 2019   MASTECTOMY WITH RADIOACTIVE SEED GUIDED EXCISION AND AXILLARY SENTINEL LYMPH NODE BIOPSY Left 10/26/2017   Procedure: LEFT MASTECTOMY WITH SEED TARGETED  LEFT AXILLARY LYMPH NODE EXCISION AND LEFT SENTINEL LYMPH NODE BIOPSY;  Surgeon: Almond Lint, MD;  Location: [facility];  Service: General;  Laterality: Left;   TONSILLECTOMY     WISDOM TOOTH EXTRACTION     Patient Active Problem List   Diagnosis Date Noted   Metastatic malignant neoplasm (HCC) 12/19/2021   Family history of breast cancer    History of  therapeutic radiation 04/27/2018   Breast cancer, left breast (HCC) 10/26/2017   Malignant neoplasm of lower-outer quadrant of left breast of female, estrogen receptor positive (HCC) 07/15/2016   Plantar fasciitis 07/30/2015   Metatarsalgia 10/18/2014    ONSET DATE: September 2023  REFERRING DIAG: C79.51 (ICD-10-CM) - Malignant neoplasm metastatic to bone (HCC)   THERAPY DIAG:  Difficulty in walking, not elsewhere classified  Muscle weakness (generalized)  Decreased range of motion of right shoulder  Stiffness of cervical spine  Painful cervical ROM  Decreased range of motion of left shoulder  Rationale for Evaluation and Treatment: Rehabilitation  SUBJECTIVE:  SUBJECTIVE STATEMENT:  ` Patient reports having some increased right-sided neck and shoulder pain this date secondary to taking swallowing test and positioning required for the test.  Patient reports no other significant changes since she was last seen in clinic Pt accompanied by: self  PERTINENT HISTORY:   Pt is 62 y.o. female that is seeking therapy for weakness of the R LE.  Pt tripped over the threshold of her door back in September of 2023, and had a transverse fracture of the L patella when she fell on it.  She was recently diagnosed with bone cancer back in October of 2023.  Pt notes that she has more stiffness in the neck and pelvis region because the cancer is located in those locations.  Pt is currently utilizing a hinged knee brace that she received from a friend for support of the L knee.  Secure Chat with referring provider [name], MD, and asked for clarification on any precautions were present due to the pt's cancer diagnosis  Pt is cleared to have modalities performed including dry needling and heat applied per  MD.   PAIN:  Are you having pain? No  PRECAUTIONS: Other: Cancer  WEIGHT BEARING RESTRICTIONS: Pt denies having any weightbearing restrictions on the knee  FALLS: Has patient fallen in last 6 months? Yes. Number of falls 1  LIVING ENVIRONMENT: Lives with: lives alone Lives in: House/apartment Stairs: No Has following equipment at home: Dan Humphreys - 4 wheeled, shower chair, and elevated toilet with bars on the side.  Pt is looking at putting in grab bars in the shower, but has assistant that helps with showers.  PLOF: Independent  PATIENT GOALS: To get stronger, be able to walk better.    OBJECTIVE:   DIAGNOSTIC FINDINGS:   CLINICAL DATA:  Left knee pain   EXAM: LEFT KNEE - 1-2 VIEW  COMPARISON:  October 2022  FINDINGS: Nonunion of left patellar fracture. No new acute fracture. Degenerative changes of the knee demonstrated by subchondral lucencies of the distal femur. Visualized soft tissues are unremarkable.  IMPRESSION: Nonunion of left patellar fracture.   COGNITION: Overall cognitive status: Within functional limits for tasks assessed   SENSATION: WFL   POSTURE: No Significant postural limitations.  Pt is however unable to rotate her neck or move her hips as much due to the cancer in the pt.    CERVICAL ROM:   Active ROM A/PROM (deg) eval AROM 4/17   Flexion 36   Extension 16   Right lateral flexion 11   Left lateral flexion 10   Right rotation 16 28  Left rotation 26 39    (Blank rows = not tested)   LOWER EXTREMITY ROM:     Active  Right Eval Left Eval  Knee flexion 85 deg deferred  Knee extension 17 deg lacking  deferred    LOWER EXTREMITY MMT:    MMT Right Eval Left Eval  Hip flexion 4+ 4  Knee flexion 4+ 4  Knee extension 4+ 4-    UPPER EXTREMITY ROM:  Active ROM Right eval Left eval R 4/17 L 4/17   Shoulder flexion 62 deg 99 deg 76 105   Shoulder scaption 60 deg 56 deg 76  91    UPPER EXTREMITY MMT:  MMT Right eval  Left eval  Shoulder flexion 3+ with pain 4  Shoulder abduction 3+ with pain 4  Grip strength 28# 38#     BED MOBILITY:  Supine to sit Modified independence, extra time necessary.  TRANSFERS: Assistive device utilized: None  Sit to stand: Modified independence Stand to sit: Modified independence Chair to chair: Modified independence     GAIT: Gait pattern: step through pattern, decreased step length- Right, decreased stance time- Left, and decreased stride length Distance walked: 40' Assistive device utilized: None Level of assistance: Modified independence Comments: Pt ambulates with significant stiffness due to the hinged knee brace on the L LE as well as the stiffness that she has in her joints from the cancer.  FUNCTIONAL TESTS:  5 times sit to stand: 23.43 sec from elevated mat table; 4/17: 16.53sec from slightly elevated mat table   Timed up and go (TUG): 23.33 sec; 4/17: 13.61 average of 2 trials  2 minute walk test: 211 10 meter walk test: 18.33 sec Berg Balance Scale: 43/56; 4/17: 48/56     PATIENT SURVEYS:  FOTO 46/59  TODAY'S TREATMENT: DATE: 08/05/22    Nustep LE only level 1 x 2 min followed by upper extremity and lower extremity reciprocal movements times another 2 minutes  Gait with 3# ankle weights 2 x 366ft with supervision assist   Seated therex: Rows Rtb 2 x 10 reps   LAQ 3# ankle weights x5 BLE with 2 sec hold at end range.  Hip abduction 2 x 12 GTB.    Manual therapy ischemic trigger point release as well as soft tissue mobilization to right and left upper trapezius musculature as well as levator scapula musculature with increased attention to the right side.  x 15  minutes   PATIENT EDUCATION: Education details: Pt educated throughout session about proper posture and technique with exercises. Improved exercise technique, movement at target joints, use of target muscles after min to mod verbal, visual, tactile cues. Person educated:  Patient Education method: Explanation Education comprehension: verbalized understanding  HOME EXERCISE PROGRAM:  Access Code: [email or web address] URL: [email or web address] Date: 05/19/2022 Prepared by: Grier Rocher  Exercises - Seated Hip Abduction with Resistance  - 1 x daily - 7 x weekly - 3 sets - 10 reps - Standing Hip Extension Kicks  - 1 x daily - 7 x weekly - 3 sets - 10 reps - Standing Hip Abduction Kicks  - 1 x daily - 7 x weekly - 3 sets - 10 reps  Access Code: [email or web address] URL: [email or web address] Date: 05/14/2022 Prepared by: Tomasa Hose  Exercises - Isometric Shoulder Flexion at Wall  - 1 x daily - 7 x weekly - 3 sets - 10 reps - Isometric Shoulder Extension at Wall  - 1 x daily - 7 x weekly - 3 sets - 10 reps - Isometric Shoulder Abduction at Wall  - 1 x daily - 7 x weekly - 3 sets - 10 reps - Isometric Shoulder Adduction  - 1 x daily - 7 x weekly - 3 sets - 10 reps   GOALS:  Goals reviewed with patient? Yes  SHORT TERM GOALS: Target date: 06/03/2022  Pt will be independent with HEP in order to demonstrate increased ability to perform tasks related to occupation/hobbies. Baseline: Not given HEP at initial evaluation. Goal status: MET   LONG TERM GOALS: Target date: 07/29/2022  Patient will demonstrate improved function as evidenced by a score of 59 on FOTO measure for full participation in activities at home and in the community. Baseline: Evaluation: 46, 4/17: 68. 5/13: 58 Goal status: in progress   2.  Patient to demonstrate increased cervical rotation ROM to be 65 deg in order to return to PLOF and improving safety while  driving. Baseline: 16/26 deg 4/17 28/39 DEG 5/13 28/29 Goal status: IN PROGRESS  3.  Pt to improve B shoulder ROM to be WFL (Flexion: 120 deg; Abduction: 130 deg) in order to improve ability to complete tasks Baseline: R/L Shoulder Flex: 62/99; R/L Shoulder Scaption: 60/56 4/17: shoulder flexion 76/105 shoulder  scaption 76/91 5/13 shoulder flexion 78/92 shoulder scaption 85/108.  Goal status: IN PROGRESS  4.  Pt to improve overall strength of the LE's in order to ambulate for longer distances and be able to tolerate surgery to repair the L knee. Baseline: Global 4/5 strength in the R LE, L LE MMT deferred due to the patella being broken at this time. 4/17 improved strength on the RLE and LLE with knee extension 4-/5, hip flexion and knee flexion 4/5 5/13: BLE 4 to 4+/5 except R hip flexion 4-/5    Goal status: IN PROGRESS   5.  Patient (> 5 years old) will complete five times sit to stand test in < 15 seconds indicating an increased LE strength and improved balance. Baseline: 23.43 sec 4/17: 16.53sec from slightly elevatedd mat table  5/13: 13.72 sec with 1 inch pad in seat.  Goal status: in progress.    6.  Patient will reduce timed up and go to <11 seconds to reduce fall risk and demonstrate improved transfer/gait ability. Baseline: 23.33 sec 4/17: 13.61 average of 2 trials. 5/13 9.86 sec   Goal status: MET  7.  Pt will improve BERG by at least 3 points in order to demonstrate clinically significant improvement in balance.  Baseline: 43 4/17: 48/56 07/27/2022 49/56  Goal status: MET     ASSESSMENT:  CLINICAL IMPRESSION:  Patient presents to physical therapy with good motivation for completion of physical therapy activities.  Treatment is slightly limited secondary to patient having increased pain in the upper trapezius musculature on the right side as well as patient arriving to therapy session a few minutes late.  Patient does report some improvement in pain levels following manual therapy.  Physical therapist continues with lower extremity strength and endurance training this session.Pt will continue to benefit from skilled physical therapy intervention to address impairments, improve QOL, and attain therapy goals.     OBJECTIVE IMPAIRMENTS: Abnormal gait, decreased activity tolerance,  decreased balance, decreased endurance, decreased knowledge of use of DME, decreased mobility, difficulty walking, decreased ROM, decreased strength, hypomobility, impaired flexibility, impaired UE functional use, and pain  ACTIVITY LIMITATIONS: carrying, lifting, bending, sitting, standing, squatting, stairs, transfers, bed mobility, bathing, toileting, dressing, reach over head, hygiene/grooming, and locomotion level  PARTICIPATION LIMITATIONS: meal prep, cleaning, laundry, driving, shopping, community activity, occupation, and yard work  PERSONAL FACTORS: Time since onset of injury/illness/exacerbation and 3+ comorbidities: Hx of breast cancer, bone cancer, thyroid disease  are also affecting patient's functional outcome.   REHAB POTENTIAL: Fair due to multiple joints needing to be targeted and pt's current bone cancer that is causing her joints to stiffen and become more rigid.    CLINICAL DECISION MAKING: Evolving/moderate complexity  EVALUATION COMPLEXITY: Moderate  PLAN:  PT FREQUENCY: 2x/week  PT DURATION: 12 weeks  PLANNED INTERVENTIONS: Therapeutic exercises, Therapeutic activity, Neuromuscular re-education, Balance training, Gait training, Patient/Family education, Self Care, Joint mobilization, Stair training, Vestibular training, Canalith repositioning, DME instructions, Aquatic Therapy, Dry Needling, Spinal mobilization, Cryotherapy, Moist heat, Manual therapy, and Re-evaluation  PLAN FOR NEXT SESSION:  Allow pt identifies as most pressing issue (shoulder vs neck, vs LE) and treat with strengthening or stretching program to assist  with ROM and pain modulation.   Norman Herrlich PT ,DPT Physical Therapist- [facility]   08/05/22, 4:06 PM

## 2022-08-06 ENCOUNTER — Encounter: Payer: Self-pay | Admitting: Hematology and Oncology

## 2022-08-06 NOTE — Progress Notes (Signed)
I sent faxed request to [phone], confirmation received on 5/20, also informed everyone that fax was sent and to expect material to be sent to [facility] for Dr. Maurice March to run prognostic panel on material

## 2022-08-11 ENCOUNTER — Telehealth: Payer: Self-pay | Admitting: Hematology and Oncology

## 2022-08-11 ENCOUNTER — Inpatient Hospital Stay: Payer: Commercial Managed Care - PPO

## 2022-08-11 NOTE — Telephone Encounter (Signed)
Scheduled appointment per staff message. Left voicemail. 

## 2022-08-11 NOTE — Progress Notes (Signed)
Patient Care Team: Ollen Bowl, MD as PCP - General (Internal Medicine) [name], MD as Consulting Physician (Hematology and Oncology) Almond Lint, MD as Consulting Physician (General Surgery) Dorothy Puffer, MD as Consulting Physician (Radiation Oncology)  DIAGNOSIS: No diagnosis found.  SUMMARY OF ONCOLOGIC HISTORY: Oncology History  Malignant neoplasm of lower-outer quadrant of left breast of female, estrogen receptor positive (HCC)  06/11/2016 Mammogram   Palpable left breast masses 3:00 position: 2.2 cm; 5:30 position: 2.5 cm; 6:30 position: 0.7 cm   06/19/2016 Initial Diagnosis   Left breast biopsy 3:30: IDC with DCIS grade 1, ER 90%, PR 50%, Ki-67 15%, HER-2 negative ratio 1.13; biopsy 5:30 position: IDC grade 1   07/13/2016 Breast MRI   Large area of abnormal enhancement lower inner and lower outer quadrants left breast spanning 9 cm x 6.4 cm x 5.3 cm, no abnormal enlarged lymph nodes; T3 N0 stage II a (New AJCC staging)    07/15/2016 - 12/08/2017 Anti-estrogen oral therapy   Neoadjuvant anastrozole 1 mg daily   07/17/2016 Oncotype testing   Testing done on the biopsy: Oncotype DX score 22, intermediate risk   02/02/2017 Breast MRI   Left breast multicentric disease unchanged measuring 2.7 x 1.6 cm.  Mass in the non-mass enhancement are also not significantly changed measuring 6.2 x 2.4 cm. new enhancing mass within the outer right breast 7 mm which could be fat necrosis or inclusion cyst    02/09/2017 Imaging   Ultrasound of the right breast lesion noted on MRI: No sonographic finding corresponds to the abnormality noted on MRI   07/13/2017 Cancer Staging   Staging form: Breast, AJCC 8th Edition - Clinical stage from 07/13/2017: Stage IIA (cT3, cN0, cM0, G1, ER+, PR+, HER2-) - Signed by Loa Socks, NP on 05/18/2018   10/26/2017 Surgery   Left mastectomy: IDC grade 1, 2 foci largest spans 8.5 cm, intermediate grade DCIS, lymphovascular invasion identified,  perineural invasion identified, 1/2 lymph nodes positive with extracapsular extension, ER 9200%, PR 5 to 50%, HER-2 negative, Ki-67 10 to 15%, T3N1A Mammaprint: low risk   11/02/2017 Cancer Staging   Staging form: Breast, AJCC 8th Edition - Pathologic: No Stage Recommended (ypT3, pN1a, cM0, G1, ER+, PR+, HER2-) - Signed by [name], MD on 11/02/2017   12/08/2017 - 01/26/2018 Radiation Therapy   Adjuvant radiation therapy    02/2018 -  Anti-estrogen oral therapy   Anastrozole 1 mg daily adjuvant therapy     CHIEF COMPLIANT: Follow-up left breast cancer/ Faslodex, Ibrance and Zometa Ibrance   INTERVAL HISTORY: Kathryn Lucas is a  62 y.o. with above-mentioned history of metastatic breast cancer who is currently on Faslodex with Ibrance.   ALLERGIES:  is allergic to percocet [oxycodone-acetaminophen].  MEDICATIONS:  Current Outpatient Medications  Medication Sig Dispense Refill   anastrozole (ARIMIDEX) 1 MG tablet Take 1 tablet (1 mg total) by mouth daily. 90 tablet 3   Calcium 500-100 MG-UNIT CHEW Chew 1 tablet by mouth daily. 60 tablet    cetirizine (ZYRTEC) 10 MG tablet Take by mouth.     cholecalciferol (VITAMIN D3) 25 MCG (1000 UNIT) tablet Take 1 tablet (1,000 Units total) by mouth daily.     fluticasone (FLONASE) 50 MCG/ACT nasal spray Place into the nose.     gabapentin (NEURONTIN) 300 MG capsule Take 300 mg by mouth 3 (three) times daily.     levothyroxine (SYNTHROID, LEVOTHROID) 175 MCG tablet Take 112 mcg by mouth.      LORazepam (ATIVAN)  0.5 MG tablet Take 1 tablet (0.5 mg total) by mouth at bedtime. 30 tablet 0   ondansetron (ZOFRAN) 8 MG tablet Take 1 tablet (8 mg total) by mouth every 8 (eight) hours as needed for nausea. 30 tablet 3   palbociclib (IBRANCE) 75 MG capsule Take 1 capsule (75 mg total) by mouth daily with breakfast. Take whole with food. Take for 21 days. (As directed by MD) 21 capsule 6   vitamin C (ASCORBIC ACID) 250 MG tablet Take 1 tablet (250 mg  total) by mouth daily.     zinc gluconate 50 MG tablet Take 1 tablet (50 mg total) by mouth daily.     No current facility-administered medications for this visit.    PHYSICAL EXAMINATION: ECOG PERFORMANCE STATUS: {CHL ONC ECOG [phone]}  There were no vitals filed for this visit. There were no vitals filed for this visit.  BREAST:*** No palpable masses or nodules in either right or left breasts. No palpable axillary supraclavicular or infraclavicular adenopathy no breast tenderness or nipple discharge. (exam performed in the presence of a chaperone)  LABORATORY DATA:  I have reviewed the data as listed    Latest Ref Rng & Units 06/22/2022    1:56 PM 05/18/2022    1:27 PM 04/20/2022    1:44 PM  CMP  Glucose 70 - 99 mg/dL 161  82  83   BUN 8 - 23 mg/dL 13  20  15    Creatinine 0.44 - 1.00 mg/dL 0.96  0.45  4.09   Sodium 135 - 145 mmol/L 136  136  135   Potassium 3.5 - 5.1 mmol/L 3.9  4.2  4.3   Chloride 98 - 111 mmol/L 103  106  105   CO2 22 - 32 mmol/L 26  25  25    Calcium 8.9 - 10.3 mg/dL 9.8  9.1  9.4   Total Protein 6.5 - 8.1 g/dL 6.8  6.5  6.3   Total Bilirubin 0.3 - 1.2 mg/dL 0.6  0.5  0.6   Alkaline Phos 38 - 126 U/L 63  68  74   AST 15 - 41 U/L 194  89  94   ALT 0 - 44 U/L 23  21  41     Lab Results  Component Value Date   WBC 1.9 (L) 06/22/2022   HGB 12.4 06/22/2022   HCT 35.2 (L) 06/22/2022   MCV 103.8 (H) 06/22/2022   PLT 150 06/22/2022   NEUTROABS 1.1 (L) 06/22/2022    ASSESSMENT & PLAN:  No problem-specific Assessment & Plan notes found for this encounter.    No orders of the defined types were placed in this encounter.  The patient has a good understanding of the overall plan. she agrees with it. she will call with any problems that may develop before the next visit here. Total time spent: 30 mins including face to face time and time spent for planning, charting and co-ordination of care   Sherlyn Lick, CMA 08/11/22    I Janan Ridge  am acting as a Neurosurgeon for [employer]  ***

## 2022-08-12 ENCOUNTER — Ambulatory Visit: Payer: Commercial Managed Care - PPO | Admitting: Physical Therapy

## 2022-08-12 DIAGNOSIS — M542 Cervicalgia: Secondary | ICD-10-CM

## 2022-08-12 DIAGNOSIS — R262 Difficulty in walking, not elsewhere classified: Secondary | ICD-10-CM | POA: Diagnosis not present

## 2022-08-12 DIAGNOSIS — M436 Torticollis: Secondary | ICD-10-CM

## 2022-08-12 DIAGNOSIS — M25562 Pain in left knee: Secondary | ICD-10-CM

## 2022-08-12 DIAGNOSIS — M6281 Muscle weakness (generalized): Secondary | ICD-10-CM

## 2022-08-12 DIAGNOSIS — M25612 Stiffness of left shoulder, not elsewhere classified: Secondary | ICD-10-CM

## 2022-08-12 DIAGNOSIS — R29898 Other symptoms and signs involving the musculoskeletal system: Secondary | ICD-10-CM

## 2022-08-12 DIAGNOSIS — M25611 Stiffness of right shoulder, not elsewhere classified: Secondary | ICD-10-CM

## 2022-08-12 NOTE — Therapy (Signed)
OUTPATIENT PHYSICAL THERAPY NEURO TREATMENT/  PHYSICAL THERAPY PROGRESS NOTE   Dates of reporting period  07/01/2022   to   08/12/2022     Patient Name: Kathryn Lucas MRN: [id number] DOB:[birth year], 62 y.o., female Today's Date: 08/05/2022   PCP: Ardean Larsen, MD REFERRING PROVIDER: [name], MD  END OF SESSION:  PT End of Session - 08/12/22 1615     Visit Number 20    Number of Visits 40    Date for PT Re-Evaluation 10/19/22    Authorization Type [employer]    Progress Note Due on Visit 20    PT Start Time 1605    PT Stop Time 1647    PT Time Calculation (min) 42 min    Equipment Utilized During Treatment Gait belt    Activity Tolerance Patient tolerated treatment well;Patient limited by pain    Behavior During Therapy WFL for tasks assessed/performed                   Past Medical History:  Diagnosis Date   Breast cancer (HCC)    left breast cancer   Cancer (HCC) 09/2017   left breast cancer   Family history of breast cancer    Hypothyroidism    Personal history of radiation therapy 2019   Thyroid disease    Past Surgical History:  Procedure Laterality Date   BREAST BIOPSY Left 2018   BREAST BIOPSY Left 2019   BREAST RECONSTRUCTION WITH PLACEMENT OF TISSUE EXPANDER AND ALLODERM Left 10/26/2017   Procedure: LEFT BREAST RECONSTRUCTION WITH PLACEMENT OF TISSUE EXPANDER AND ALLODERM;  Surgeon: Glenna Fellows, MD;  Location: [facility];  Service: Plastics;  Laterality: Left;   MASTECTOMY Left 2019   MASTECTOMY WITH RADIOACTIVE SEED GUIDED EXCISION AND AXILLARY SENTINEL LYMPH NODE BIOPSY Left 10/26/2017   Procedure: LEFT MASTECTOMY WITH SEED TARGETED  LEFT AXILLARY LYMPH NODE EXCISION AND LEFT SENTINEL LYMPH NODE BIOPSY;  Surgeon: Almond Lint, MD;  Location: [facility];  Service: General;  Laterality: Left;   TONSILLECTOMY     WISDOM TOOTH EXTRACTION     Patient Active Problem List   Diagnosis Date Noted    Metastatic malignant neoplasm (HCC) 12/19/2021   Family history of breast cancer    History of therapeutic radiation 04/27/2018   Breast cancer, left breast (HCC) 10/26/2017   Malignant neoplasm of lower-outer quadrant of left breast of female, estrogen receptor positive (HCC) 07/15/2016   Plantar fasciitis 07/30/2015   Metatarsalgia 10/18/2014    ONSET DATE: September 2023  REFERRING DIAG: C79.51 (ICD-10-CM) - Malignant neoplasm metastatic to bone (HCC)   THERAPY DIAG:  Difficulty in walking, not elsewhere classified  Muscle weakness (generalized)  Decreased range of motion of right shoulder  Stiffness of cervical spine  Painful cervical ROM  Decreased range of motion of left shoulder  Rationale for Evaluation and Treatment: Rehabilitation  SUBJECTIVE:  SUBJECTIVE STATEMENT:  Patient reports that she is doing well, mild increased cervical stiffness from sleeping wrong last night. No other updates.    Pt accompanied by: self  PERTINENT HISTORY:   Pt is 61 y.o. female that is seeking therapy for weakness of the R LE.  Pt tripped over the threshold of her door back in September of 2023, and had a transverse fracture of the L patella when she fell on it.  She was recently diagnosed with bone cancer back in October of 2023.  Pt notes that she has more stiffness in the neck and pelvis region because the cancer is located in those locations.  Pt is currently utilizing a hinged knee brace that she received from a friend for support of the L knee.  Secure Chat with referring provider [name], MD, and asked for clarification on any precautions were present due to the pt's cancer diagnosis  Pt is cleared to have modalities performed including dry needling and heat applied per MD.   PAIN:  Are  you having pain? No  PRECAUTIONS: Other: Cancer  WEIGHT BEARING RESTRICTIONS: Pt denies having any weightbearing restrictions on the knee  FALLS: Has patient fallen in last 6 months? Yes. Number of falls 1  LIVING ENVIRONMENT: Lives with: lives alone Lives in: House/apartment Stairs: No Has following equipment at home: Dan Humphreys - 4 wheeled, shower chair, and elevated toilet with bars on the side.  Pt is looking at putting in grab bars in the shower, but has assistant that helps with showers.  PLOF: Independent  PATIENT GOALS: To get stronger, be able to walk better.    OBJECTIVE:   DIAGNOSTIC FINDINGS:   CLINICAL DATA:  Left knee pain   EXAM: LEFT KNEE - 1-2 VIEW  COMPARISON:  October 2022  FINDINGS: Nonunion of left patellar fracture. No new acute fracture. Degenerative changes of the knee demonstrated by subchondral lucencies of the distal femur. Visualized soft tissues are unremarkable.  IMPRESSION: Nonunion of left patellar fracture.   COGNITION: Overall cognitive status: Within functional limits for tasks assessed   SENSATION: WFL   POSTURE: No Significant postural limitations.  Pt is however unable to rotate her neck or move her hips as much due to the cancer in the pt.    CERVICAL ROM:   Active ROM A/PROM (deg) eval AROM 4/17  AROM 5/  Flexion 36    Extension 16    Right lateral flexion 11    Left lateral flexion 10    Right rotation 16 28 32  Left rotation 26 39  28   (Blank rows = not tested)   LOWER EXTREMITY ROM:     Active  Right Eval Left Eval  Knee flexion 85 deg deferred  Knee extension 17 deg lacking  deferred    LOWER EXTREMITY MMT:    MMT Right Eval Left Eval  Hip flexion 4+ 4  Knee flexion 4+ 4  Knee extension 4+ 4-    UPPER EXTREMITY ROM:  Active ROM Right eval Left eval R 4/17 L 4/17  R 5/29 L 5/29  Shoulder flexion 62 deg 99 deg 76 105  75 104  Shoulder scaption 60 deg 56 deg 76  91 80 95    UPPER  EXTREMITY MMT:  MMT Right eval Left eval  Shoulder flexion 3+ with pain 4  Shoulder abduction 3+ with pain 4  Grip strength 28# 38#     BED MOBILITY:  Supine to sit Modified independence, extra time necessary.  TRANSFERS: Assistive device utilized: None  Sit to stand: Modified independence Stand to sit: Modified independence Chair to chair: Modified independence     GAIT: Gait pattern: step through pattern, decreased step length- Right, decreased stance time- Left, and decreased stride length Distance walked: 40' Assistive device utilized: None Level of assistance: Modified independence Comments: Pt ambulates with significant stiffness due to the hinged knee brace on the L LE as well as the stiffness that she has in her joints from the cancer.  FUNCTIONAL TESTS:  5 times sit to stand: 23.43 sec from elevated mat table; 4/17: 16.53sec from slightly elevated mat table   Timed up and go (TUG): 23.33 sec; 4/17: 13.61 average of 2 trials  2 minute walk test: 211 10 meter walk test: 18.33 sec Berg Balance Scale: 43/56; 4/17: 48/56     PATIENT SURVEYS:  FOTO 46/59  TODAY'S TREATMENT: DATE: 08/05/22    UBE 2 min forward/2 min reverse. Cues for pain free shoulder ROM.   Pt performed 5 time sit<>stand (5xSTS): 14.46 sec with 1 inch cushion. Without cushion 16.50sec   (>15 sec indicates increased fall risk)   Patient demonstrates increased fall risk as noted by score of   52/56 on Berg Balance Scale.  (<36= high risk for falls, close to 100%; 37-45 significant >80%; 46-51 moderate >50%; 52-55 lower >25%)  PT instructed pt in TUG: 9.1 sec (9.8sec, 8.55 sec) (average of 3 trials; >13.5 sec indicates increased fall risk)   MMT RLE grossly 4+/5 except hip flexion 4-/5 LLE grossly 4+/5 except hip flexion 4/5   PATIENT EDUCATION: Education details: Pt educated throughout session about proper posture and technique with exercises. Improved exercise technique, movement at  target joints, use of target muscles after min to mod verbal, visual, tactile cues. Person educated: Patient Education method: Explanation Education comprehension: verbalized understanding  HOME EXERCISE PROGRAM:  Access Code: [email or web address] URL: [email or web address] Date: 05/19/2022 Prepared by: Grier Rocher  Exercises - Seated Hip Abduction with Resistance  - 1 x daily - 7 x weekly - 3 sets - 10 reps - Standing Hip Extension Kicks  - 1 x daily - 7 x weekly - 3 sets - 10 reps - Standing Hip Abduction Kicks  - 1 x daily - 7 x weekly - 3 sets - 10 reps  Access Code: [email or web address] URL: [email or web address] Date: 05/14/2022 Prepared by: Tomasa Hose  Exercises - Isometric Shoulder Flexion at Wall  - 1 x daily - 7 x weekly - 3 sets - 10 reps - Isometric Shoulder Extension at Wall  - 1 x daily - 7 x weekly - 3 sets - 10 reps - Isometric Shoulder Abduction at Wall  - 1 x daily - 7 x weekly - 3 sets - 10 reps - Isometric Shoulder Adduction  - 1 x daily - 7 x weekly - 3 sets - 10 reps   GOALS:  Goals reviewed with patient? Yes  SHORT TERM GOALS: Target date: 06/03/2022  Pt will be independent with HEP in order to demonstrate increased ability to perform tasks related to occupation/hobbies. Baseline: Not given HEP at initial evaluation. Goal status: MET   LONG TERM GOALS: Target date: 07/29/2022  Patient will demonstrate improved function as evidenced by a score of 59 on FOTO measure for full participation in activities at home and in the community. Baseline: Evaluation: 46, 4/17: 68. 5/13: 58 Goal status: in progress   2.  Patient to demonstrate increased cervical rotation ROM to be 65 deg in order to  return to PLOF and improving safety while driving. Baseline: 16/26 deg 4/17 28/39 DEG 5/13 28/29  Goal status: IN PROGRESS  3.  Pt to improve B shoulder ROM to be WFL (Flexion: 120 deg; Abduction: 130 deg) in order to improve ability to complete  tasks Baseline: R/L Shoulder Flex: 62/99; R/L Shoulder Scaption: 60/56 4/17: shoulder flexion 76/105 shoulder scaption 76/91 5/13 shoulder flexion 78/92 shoulder scaption 85/108.  Goal status: IN PROGRESS  4.  Pt to improve overall strength of the LE's in order to ambulate for longer distances and be able to tolerate surgery to repair the L knee. Baseline: Global 4/5 strength in the R LE, L LE MMT deferred due to the patella being broken at this time. 4/17 improved strength on the RLE and LLE with knee extension 4-/5, hip flexion and knee flexion 4/5 5/13: BLE 4 to 4+/5 except R hip flexion 4-/5    Goal status: IN PROGRESS   5.  Patient (> 64 years old) will complete five times sit to stand test in < 15 seconds indicating an increased LE strength and improved balance. Baseline: 23.43 sec 4/17: 16.53sec from slightly elevatedd mat table  5/13: 13.72 sec with 1 inch pad in seat.  5/29: 13.9sec with 1 inch pad. 16.50 in standard seat.  Goal status: in progress.    6.  Patient will reduce timed up and go to <11 seconds to reduce fall risk and demonstrate improved transfer/gait ability. Baseline: 23.33 sec 4/17: 13.61 average of 2 trials. 5/13 9.86 sec   Goal status: MET  7.  Pt will improve BERG by at least 3 points in order to demonstrate clinically significant improvement in balance.  Baseline: 43 4/17: 48/56 07/27/2022 49/56  Goal status: MET      ASSESSMENT:  CLINICAL IMPRESSION:  Patient presents to physical therapy with good motivation for completion of physical therapy activities.  Session focused on assessment of LTG. Pt noted to have improved balance as evidenced by increase in Berg to 49/56, and decreased time in TUG to <10 sec. Pt noted to have limited change in shoulder and cervical ROM, reporting that she slept wrong. Treatment is slightly limited secondary to patient having increased pain in the upper trapezius musculature on the right side as well as patient arriving to  therapy session a few minutes late.Physical therapist continues with lower extremity strength and endurance training this session.Pt will continue to benefit from skilled physical therapy intervention to address impairments, improve QOL, and attain therapy goals. Patient's condition has the potential to improve in response to therapy. Maximum improvement is yet to be obtained. The anticipated improvement is attainable and reasonable in a generally predictable time.     OBJECTIVE IMPAIRMENTS: Abnormal gait, decreased activity tolerance, decreased balance, decreased endurance, decreased knowledge of use of DME, decreased mobility, difficulty walking, decreased ROM, decreased strength, hypomobility, impaired flexibility, impaired UE functional use, and pain  ACTIVITY LIMITATIONS: carrying, lifting, bending, sitting, standing, squatting, stairs, transfers, bed mobility, bathing, toileting, dressing, reach over head, hygiene/grooming, and locomotion level  PARTICIPATION LIMITATIONS: meal prep, cleaning, laundry, driving, shopping, community activity, occupation, and yard work  PERSONAL FACTORS: Time since onset of injury/illness/exacerbation and 3+ comorbidities: Hx of breast cancer, bone cancer, thyroid disease  are also affecting patient's functional outcome.   REHAB POTENTIAL: Fair due to multiple joints needing to be targeted and pt's current bone cancer that is causing her joints to stiffen and become more rigid.    CLINICAL DECISION MAKING: Evolving/moderate complexity  EVALUATION  COMPLEXITY: Moderate  PLAN:  PT FREQUENCY: 2x/week  PT DURATION: 12 weeks  PLANNED INTERVENTIONS: Therapeutic exercises, Therapeutic activity, Neuromuscular re-education, Balance training, Gait training, Patient/Family education, Self Care, Joint mobilization, Stair training, Vestibular training, Canalith repositioning, DME instructions, Aquatic Therapy, Dry Needling, Spinal mobilization, Cryotherapy, Moist heat,  Manual therapy, and Re-evaluation  PLAN FOR NEXT SESSION:   Allow pt identifies as most pressing issue (shoulder vs neck, vs LE) and treat with strengthening or stretching program to assist with ROM and pain modulation.   Grier Rocher PT, DPT  Physical Therapist - [facility]  10:44 AM 08/13/22

## 2022-08-13 ENCOUNTER — Inpatient Hospital Stay (HOSPITAL_BASED_OUTPATIENT_CLINIC_OR_DEPARTMENT_OTHER): Payer: Commercial Managed Care - PPO | Admitting: Hematology and Oncology

## 2022-08-13 VITALS — BP 127/71 | HR 113 | Temp 97.6°F | Resp 18 | Ht 70.0 in | Wt 147.1 lb

## 2022-08-13 DIAGNOSIS — Z5111 Encounter for antineoplastic chemotherapy: Secondary | ICD-10-CM | POA: Diagnosis not present

## 2022-08-13 DIAGNOSIS — C50512 Malignant neoplasm of lower-outer quadrant of left female breast: Secondary | ICD-10-CM

## 2022-08-13 DIAGNOSIS — Z170 Estrogen receptor positive status [ER+]: Secondary | ICD-10-CM

## 2022-08-13 NOTE — Assessment & Plan Note (Addendum)
06/19/2016 Left breast biopsy 3:30: IDC with DCIS grade 1, ER 90%, PR 50%, Ki-67 15%, HER-2 negative ratio 1.13; biopsy 5:30 position: IDC grade 1   10/26/17: Left mastectomy: IDC grade 1, 2 foci largest spans 8.5 cm, intermediate grade DCIS, lymphovascular invasion identified, perineural invasion identified, 1/2 lymph nodes positive with extracapsular extension, ER 9200%, PR 5 to 50%, HER-2 negative, Ki-67 10 to 15%, T3N1A   Oncotype DX score 22, intermediate risk, chemotherapy not felt to have significant benefit.   Treatment Summary: 1. Antiestrogen therapy with anastrozole 1 mg daily started 07/15/2016 2. Mastectomy 10/26/2017, Mammaprint low risk luminal type A 3. Followed by adjuvant radiation 12/08/17- 01/26/18  4. Followed by adjuvant antiestrogen therapy anastrozole started 01/17/2018 (originally started 07/15/2016) -------------------------------------------------------------------- Low back pain August 2023: Underwent CT myelogram: Large expansile lesion in the sacrum with extraosseous extension of the tumor, diffuse lytic lesions throughout the visualized spine with metastatic disease myeloma is considered less likely.  (This was ordered by Dr. Marcene Corning)   Treatment plan: 1.  PET CT scan 12/27/2021: Widespread bone metastatic disease largest lesion involving the sacrum with pathological fractures of T6 and T10 retroperitoneal and pelvic lymph node metastasis, right axillary lymph node, activity in the pancreatic head, hypermetabolic activity in the adrenal glands, right thyroid nodule. 2. biopsy of sacrum: 12/26/2021: Metastatic breast cancer, ER 90%, PR 10%, HER2 negative (0) 3.  Treatment plan: Ibrance along with Faslodex started 12/25/2021 4.  Xgeva for bone metastases.  Every 3 months 5.  Palliative radiation to the sacrum completed 01/19/2022 Genetic testing for BRCA  analysis. -------------------------------------------------------------------------------------------------------------------------------------- Current treatment: Ibrance with Faslodex and Zometa, started 12/25/2021 Toxicities: Leukopenia: Currently on Ibrance 75 mg.   Fatigue Alternating constipation and diarrhea Severe bone pain: Patient is not taking any pain medications currently because the pain is now under very good control without it.  CT CAP: 06/18/2022: Overall appearance similar to previous exam.  Makes it lucent and sclerotic bone metastases similar.  Stable soft tissue nodule pancreas, stable adrenal nodules, small right axillary lymph node slightly decreased   Skin punch biopsy 07/23/2022: Breast cancer ER 95%, PR 0%, HER2 2+ by IHC, FISH negative ratio 1.1 (with HER2 being 2+ positive she would be eligible for Enhertu) Knee swelling: Saw orthopedics   We will arrange for CT chest abdomen pelvis in July and follow-up after that with Jonny Ruiz.

## 2022-08-14 ENCOUNTER — Other Ambulatory Visit (HOSPITAL_COMMUNITY): Payer: Self-pay

## 2022-08-14 ENCOUNTER — Encounter: Payer: Self-pay | Admitting: Hematology and Oncology

## 2022-08-14 NOTE — Progress Notes (Signed)
[facility] testing paperwork faxed to [phone] per MD request with receipt confirmation. Paperwork included molecular profiling requisition, demo sheet, insurance cards, pathology report, and most recent MD office note.

## 2022-08-16 ENCOUNTER — Encounter: Payer: Self-pay | Admitting: Hematology and Oncology

## 2022-08-17 ENCOUNTER — Telehealth: Payer: Self-pay | Admitting: Hematology and Oncology

## 2022-08-17 ENCOUNTER — Other Ambulatory Visit: Payer: Self-pay | Admitting: *Deleted

## 2022-08-17 ENCOUNTER — Encounter: Payer: Self-pay | Admitting: *Deleted

## 2022-08-17 ENCOUNTER — Other Ambulatory Visit: Payer: Self-pay

## 2022-08-17 ENCOUNTER — Encounter: Payer: Self-pay | Admitting: Hematology and Oncology

## 2022-08-17 ENCOUNTER — Telehealth: Payer: Self-pay

## 2022-08-17 ENCOUNTER — Inpatient Hospital Stay: Payer: Medicaid Other

## 2022-08-17 ENCOUNTER — Other Ambulatory Visit (HOSPITAL_COMMUNITY): Payer: Self-pay

## 2022-08-17 DIAGNOSIS — Z170 Estrogen receptor positive status [ER+]: Secondary | ICD-10-CM

## 2022-08-17 MED ORDER — PALBOCICLIB 75 MG PO TABS
75.0000 mg | ORAL_TABLET | Freq: Every day | ORAL | 0 refills | Status: DC
Start: 2022-08-17 — End: 2022-09-02
  Filled 2022-08-17: qty 21, 21d supply, fill #0

## 2022-08-17 MED ORDER — PALBOCICLIB 75 MG PO CAPS
75.0000 mg | ORAL_CAPSULE | Freq: Every day | ORAL | 6 refills | Status: DC
Start: 2022-08-17 — End: 2022-08-17
  Filled 2022-08-17: qty 21, 28d supply, fill #0
  Filled 2022-08-17: qty 21, 21d supply, fill #0

## 2022-08-17 NOTE — Telephone Encounter (Signed)
Rescheduled appointment per provider per 5/30 los. Patient is aware of the changes made to her upcoming appointment.

## 2022-08-17 NOTE — Telephone Encounter (Signed)
Oral Oncology Pharmacist Encounter  Ibrance prescription sent in to [facility] outpatient pharmacy for capsules. Due to [facility] only having tablets, prescription changed with okay to change to tablets.   Bethel Born, PharmD Hematology/Oncology Clinical Pharmacist [facility] [phone]

## 2022-08-18 ENCOUNTER — Encounter: Payer: Commercial Managed Care - PPO | Admitting: Physical Therapy

## 2022-08-18 ENCOUNTER — Other Ambulatory Visit: Payer: Medicaid Other

## 2022-08-18 ENCOUNTER — Other Ambulatory Visit: Payer: Self-pay

## 2022-08-18 ENCOUNTER — Inpatient Hospital Stay: Payer: Medicaid Other | Attending: Hematology and Oncology

## 2022-08-18 VITALS — BP 104/64 | HR 110 | Temp 97.2°F

## 2022-08-18 DIAGNOSIS — C7951 Secondary malignant neoplasm of bone: Secondary | ICD-10-CM | POA: Diagnosis present

## 2022-08-18 DIAGNOSIS — R634 Abnormal weight loss: Secondary | ICD-10-CM | POA: Diagnosis not present

## 2022-08-18 DIAGNOSIS — C50512 Malignant neoplasm of lower-outer quadrant of left female breast: Secondary | ICD-10-CM | POA: Diagnosis present

## 2022-08-18 DIAGNOSIS — Z515 Encounter for palliative care: Secondary | ICD-10-CM

## 2022-08-18 DIAGNOSIS — C799 Secondary malignant neoplasm of unspecified site: Secondary | ICD-10-CM

## 2022-08-18 DIAGNOSIS — Z5111 Encounter for antineoplastic chemotherapy: Secondary | ICD-10-CM | POA: Diagnosis present

## 2022-08-18 DIAGNOSIS — Z170 Estrogen receptor positive status [ER+]: Secondary | ICD-10-CM | POA: Insufficient documentation

## 2022-08-18 DIAGNOSIS — Z79811 Long term (current) use of aromatase inhibitors: Secondary | ICD-10-CM | POA: Insufficient documentation

## 2022-08-18 MED ORDER — FULVESTRANT 250 MG/5ML IM SOSY
500.0000 mg | PREFILLED_SYRINGE | Freq: Once | INTRAMUSCULAR | Status: AC
  Administered 2022-08-18: 500 mg via INTRAMUSCULAR
  Filled 2022-08-18: qty 10

## 2022-08-18 NOTE — Progress Notes (Signed)
PATIENT NAME: Kathryn Lucas DOB: [birth year] MRN: [id number]  PRIMARY CARE PROVIDER: Ollen Bowl, MD  RESPONSIBLE PARTY:  Acct ID - Guarantor Home Phone Work Phone Relationship Acct Type  [id number] KURSTON, MINERVINI* [phone]  Self P/F     [address], [place], [place] [address]   Home visit completed with patient.  Fatigue:  Went to [place] over the weekend and just returned yesterday.  Canceled therapy for today due to her fatigue.   Infection:  Recently treated for UTI.   Patient advised first indicator was foul smell to her urine.  She denied issues with frequency, urgency or burning.  Issues are now resolved.   Metastatic Breast Cancer:  Patient recently seen by Dr. Pamelia Hoit and will follow up again in July.  Patient is concerned about her weight loss.  Down to 147 lbs.  She has purchased Ensure and is drinking daily.  Will also follow up with nutritionist next month.  Lesion present to back is related to breast cancer.  Patient has a follow up visit on Friday for full body scan with dermatology.   Oral cancer med, Ilda Foil now being filled by [facility].  Patient voiced her relief that this is now resolved.  Patient is hopeful cancer treatments will continue and she will see improvement in her overall condition.    Pain:  Well-managed at this time.  Patient reports some soreness due her physical activity over the weekend.   Follow up visit scheduled for next month.    CODE STATUS: Full ADVANCED DIRECTIVES: Yes MOST FORM: No PPS: 60%   PHYSICAL EXAM:   VITALS: Today's Vitals   08/18/22 1054  BP: 104/64  Pulse: (!) 110  Temp: (!) 97.2 F (36.2 C)  SpO2: 95%    LUNGS: clear to auscultation  CARDIAC: Cor Tachy}  EXTREMITIES: - for edema SKIN: Skin color, texture, turgor normal. No rashes or lesions or mobility and turgor normal  NEURO: negative for dizziness, headaches, memory problems, speech problems, tremors, and vertigo       Truitt Merle, RN

## 2022-08-19 ENCOUNTER — Other Ambulatory Visit (HOSPITAL_COMMUNITY): Payer: Self-pay

## 2022-08-20 ENCOUNTER — Telehealth: Payer: Self-pay | Admitting: Hematology and Oncology

## 2022-08-20 ENCOUNTER — Other Ambulatory Visit: Payer: Self-pay | Admitting: *Deleted

## 2022-08-20 ENCOUNTER — Ambulatory Visit: Payer: Medicaid Other | Attending: Hematology and Oncology | Admitting: Physical Therapy

## 2022-08-20 DIAGNOSIS — M436 Torticollis: Secondary | ICD-10-CM | POA: Diagnosis present

## 2022-08-20 DIAGNOSIS — R29898 Other symptoms and signs involving the musculoskeletal system: Secondary | ICD-10-CM | POA: Diagnosis present

## 2022-08-20 DIAGNOSIS — Z170 Estrogen receptor positive status [ER+]: Secondary | ICD-10-CM | POA: Insufficient documentation

## 2022-08-20 DIAGNOSIS — C50512 Malignant neoplasm of lower-outer quadrant of left female breast: Secondary | ICD-10-CM | POA: Diagnosis present

## 2022-08-20 DIAGNOSIS — M25562 Pain in left knee: Secondary | ICD-10-CM

## 2022-08-20 DIAGNOSIS — R262 Difficulty in walking, not elsewhere classified: Secondary | ICD-10-CM | POA: Diagnosis present

## 2022-08-20 DIAGNOSIS — M25612 Stiffness of left shoulder, not elsewhere classified: Secondary | ICD-10-CM | POA: Diagnosis present

## 2022-08-20 DIAGNOSIS — M25611 Stiffness of right shoulder, not elsewhere classified: Secondary | ICD-10-CM

## 2022-08-20 DIAGNOSIS — M6281 Muscle weakness (generalized): Secondary | ICD-10-CM | POA: Diagnosis present

## 2022-08-20 DIAGNOSIS — M542 Cervicalgia: Secondary | ICD-10-CM

## 2022-08-20 NOTE — Telephone Encounter (Signed)
Scheduled appointment per scheduling message. Left voicemail.  

## 2022-08-20 NOTE — Progress Notes (Signed)
Received call from pt with complaint of weight loss.  Pt states she has an appetite and feels hungry often.  Pt states she is eating 3 meals a day as well as drinking ensure.  Per MD pt needing referral to [facility] nutritionist for evaluation of diet and caloric intake.  Referral placed, pt notified scheduling will contact her and verbalized understanding.

## 2022-08-20 NOTE — Therapy (Signed)
OUTPATIENT PHYSICAL THERAPY NEURO TREATMENT     Patient Name: Kathryn Lucas MRN: [id number] DOB:1960/03/24, 62 y.o., female Today's Date: 08/20/2022   PCP: Ardean Larsen, MD REFERRING PROVIDER: [name], MD  END OF SESSION:  PT End of Session - 08/20/22 1609     Visit Number 21    Number of Visits 40    Date for PT Re-Evaluation 10/19/22    Authorization Type [employer]    Progress Note Due on Visit 30    PT Start Time 1606    PT Stop Time 1646    PT Time Calculation (min) 40 min    Equipment Utilized During Treatment Gait belt    Activity Tolerance Patient tolerated treatment well;Patient limited by pain    Behavior During Therapy WFL for tasks assessed/performed                   Past Medical History:  Diagnosis Date   Breast cancer (HCC)    left breast cancer   Cancer (HCC) 09/2017   left breast cancer   Family history of breast cancer    Hypothyroidism    Personal history of radiation therapy 2019   Thyroid disease    Past Surgical History:  Procedure Laterality Date   BREAST BIOPSY Left 2018   BREAST BIOPSY Left 2019   BREAST RECONSTRUCTION WITH PLACEMENT OF TISSUE EXPANDER AND ALLODERM Left 10/26/2017   Procedure: LEFT BREAST RECONSTRUCTION WITH PLACEMENT OF TISSUE EXPANDER AND ALLODERM;  Surgeon: Glenna Fellows, MD;  Location: [facility];  Service: Plastics;  Laterality: Left;   MASTECTOMY Left 2019   MASTECTOMY WITH RADIOACTIVE SEED GUIDED EXCISION AND AXILLARY SENTINEL LYMPH NODE BIOPSY Left 10/26/2017   Procedure: LEFT MASTECTOMY WITH SEED TARGETED  LEFT AXILLARY LYMPH NODE EXCISION AND LEFT SENTINEL LYMPH NODE BIOPSY;  Surgeon: Almond Lint, MD;  Location: [facility];  Service: General;  Laterality: Left;   TONSILLECTOMY     WISDOM TOOTH EXTRACTION     Patient Active Problem List   Diagnosis Date Noted   Metastatic malignant neoplasm (HCC) 12/19/2021   Family history of breast cancer    History of  therapeutic radiation 04/27/2018   Breast cancer, left breast (HCC) 10/26/2017   Malignant neoplasm of lower-outer quadrant of left breast of female, estrogen receptor positive (HCC) 07/15/2016   Plantar fasciitis 07/30/2015   Metatarsalgia 10/18/2014    ONSET DATE: September 2023  REFERRING DIAG: C79.51 (ICD-10-CM) - Malignant neoplasm metastatic to bone (HCC)   THERAPY DIAG:  Difficulty in walking, not elsewhere classified  Muscle weakness (generalized)  Decreased range of motion of right shoulder  Stiffness of cervical spine  Decreased range of motion of left shoulder  Painful cervical ROM  Acute pain of left knee  Leg weakness, bilateral  Rationale for Evaluation and Treatment: Rehabilitation  SUBJECTIVE:  SUBJECTIVE STATEMENT:  Patient reports that she is doing well, states that she had a busy weekend, went to the beach with sister.  She was able to walk on the beach, as well as transfer to and from very low toilet seat in a rental apartment.  Mild shoulder stiffness reported by patient due to increased use of upper extremity support with transfers from low sitting surfaces at rental apartment.  Pt accompanied by: self  PERTINENT HISTORY:   Pt is 62 y.o. female that is seeking therapy for weakness of the R LE.  Pt tripped over the threshold of her door back in September of 2023, and had a transverse fracture of the L patella when she fell on it.  She was recently diagnosed with bone cancer back in October of 2023.  Pt notes that she has more stiffness in the neck and pelvis region because the cancer is located in those locations.  Pt is currently utilizing a hinged knee brace that she received from a friend for support of the L knee.  Secure Chat with referring provider [name],  MD, and asked for clarification on any precautions were present due to the pt's cancer diagnosis  Pt is cleared to have modalities performed including dry needling and heat applied per MD.   PAIN:  Are you having pain? No  PRECAUTIONS: Other: Cancer  WEIGHT BEARING RESTRICTIONS: Pt denies having any weightbearing restrictions on the knee  FALLS: Has patient fallen in last 6 months? Yes. Number of falls 1  LIVING ENVIRONMENT: Lives with: lives alone Lives in: House/apartment Stairs: No Has following equipment at home: Dan Humphreys - 4 wheeled, shower chair, and elevated toilet with bars on the side.  Pt is looking at putting in grab bars in the shower, but has assistant that helps with showers.  PLOF: Independent  PATIENT GOALS: To get stronger, be able to walk better.    OBJECTIVE:   DIAGNOSTIC FINDINGS:   CLINICAL DATA:  Left knee pain   EXAM: LEFT KNEE - 1-2 VIEW  COMPARISON:  October 2022  FINDINGS: Nonunion of left patellar fracture. No new acute fracture. Degenerative changes of the knee demonstrated by subchondral lucencies of the distal femur. Visualized soft tissues are unremarkable.  IMPRESSION: Nonunion of left patellar fracture.   COGNITION: Overall cognitive status: Within functional limits for tasks assessed   SENSATION: WFL   POSTURE: No Significant postural limitations.  Pt is however unable to rotate her neck or move her hips as much due to the cancer in the pt.    CERVICAL ROM:   Active ROM A/PROM (deg) eval AROM 4/17  AROM 5/  Flexion 36    Extension 16    Right lateral flexion 11    Left lateral flexion 10    Right rotation 16 28 32  Left rotation 26 39  28   (Blank rows = not tested)   LOWER EXTREMITY ROM:     Active  Right Eval Left Eval  Knee flexion 85 deg deferred  Knee extension 17 deg lacking  deferred    LOWER EXTREMITY MMT:    MMT Right Eval Left Eval  Hip flexion 4+ 4  Knee flexion 4+ 4  Knee extension 4+ 4-     UPPER EXTREMITY ROM:  Active ROM Right eval Left eval R 4/17 L 4/17  R 5/29 L 5/29  Shoulder flexion 62 deg 99 deg 76 105  75 104  Shoulder scaption 60 deg 56 deg 76  91 80  95    UPPER EXTREMITY MMT:  MMT Right eval Left eval  Shoulder flexion 3+ with pain 4  Shoulder abduction 3+ with pain 4  Grip strength 28# 38#     BED MOBILITY:  Supine to sit Modified independence, extra time necessary.   TRANSFERS: Assistive device utilized: None  Sit to stand: Modified independence Stand to sit: Modified independence Chair to chair: Modified independence     GAIT: Gait pattern: step through pattern, decreased step length- Right, decreased stance time- Left, and decreased stride length Distance walked: 40' Assistive device utilized: None Level of assistance: Modified independence Comments: Pt ambulates with significant stiffness due to the hinged knee brace on the L LE as well as the stiffness that she has in her joints from the cancer.  FUNCTIONAL TESTS:  5 times sit to stand: 23.43 sec from elevated mat table; 4/17: 16.53sec from slightly elevated mat table   Timed up and go (TUG): 23.33 sec; 4/17: 13.61 average of 2 trials  2 minute walk test: 211 10 meter walk test: 18.33 sec Berg Balance Scale: 43/56; 4/17: 48/56     PATIENT SURVEYS:  FOTO 46/59  TODAY'S TREATMENT: DATE: 08/20/22    UBE 2.5 min forward/2.5 min reverse. Cues for pain free shoulder ROM and consistent RPM of 30-40 .    Bil Shoulder AAROM into flexion 2 x 2 minutes bilateral upper extremities. AAROM into shoulder abduction 2 x 2 minute bilateral  Shoulder inferior mobilization grade 2 through various range of motion of shoulder flexion and shoulder abduction performed 6 x 45 seconds on bilateral upper extremity  STM to right distal latissimus dorsi and teres major in supine with trigger point release 3 x 1 minute to improve muscle extensibility and reduce pain and posterior capsule of the  right shoulder.  Sit<>stand performed from 26 inches x 4.  25 inches x 4.  24.5 inches x 3.  Patient noted to utilize upper extremity support from size throughout various heights of therapy mat.  Gait through rehab gym 3 x 75 feet supervision assist with no upper extremity support.  Patient demonstrating decreased lateral trunk sway with gait compared to prior therapy sessions.  Sitting and standing calf stretch performed 2 x 30 seconds each bilateral.  PATIENT EDUCATION: Education details: Pt educated throughout session about proper posture and technique with exercises. Improved exercise technique, movement at target joints, use of target muscles after min to mod verbal, visual, tactile cues. Person educated: Patient Education method: Explanation Education comprehension: verbalized understanding  HOME EXERCISE PROGRAM:  Access Code: [email or web address] URL: [email or web address] Date: 05/19/2022 Prepared by: Grier Rocher  Exercises - Seated Hip Abduction with Resistance  - 1 x daily - 7 x weekly - 3 sets - 10 reps - Standing Hip Extension Kicks  - 1 x daily - 7 x weekly - 3 sets - 10 reps - Standing Hip Abduction Kicks  - 1 x daily - 7 x weekly - 3 sets - 10 reps  Access Code: [email or web address] URL: [email or web address] Date: 05/14/2022 Prepared by: Tomasa Hose  Exercises - Isometric Shoulder Flexion at Wall  - 1 x daily - 7 x weekly - 3 sets - 10 reps - Isometric Shoulder Extension at Wall  - 1 x daily - 7 x weekly - 3 sets - 10 reps - Isometric Shoulder Abduction at Wall  - 1 x daily - 7 x weekly - 3 sets - 10 reps - Isometric Shoulder Adduction  - 1 x daily - 7 x weekly -  3 sets - 10 reps   GOALS:  Goals reviewed with patient? Yes  SHORT TERM GOALS: Target date: 06/03/2022  Pt will be independent with HEP in order to demonstrate increased ability to perform tasks related to occupation/hobbies. Baseline: Not given HEP at initial evaluation. Goal status:  MET   LONG TERM GOALS: Target date: 07/29/2022  Patient will demonstrate improved function as evidenced by a score of 59 on FOTO measure for full participation in activities at home and in the community. Baseline: Evaluation: 46, 4/17: 68. 5/13: 58 Goal status: in progress   2.  Patient to demonstrate increased cervical rotation ROM to be 65 deg in order to return to PLOF and improving safety while driving. Baseline: 16/26 deg 4/17 28/39 DEG 5/13 28/29  Goal status: IN PROGRESS  3.  Pt to improve B shoulder ROM to be WFL (Flexion: 120 deg; Abduction: 130 deg) in order to improve ability to complete tasks Baseline: R/L Shoulder Flex: 62/99; R/L Shoulder Scaption: 60/56 4/17: shoulder flexion 76/105 shoulder scaption 76/91 5/13 shoulder flexion 78/92 shoulder scaption 85/108.  Goal status: IN PROGRESS  4.  Pt to improve overall strength of the LE's in order to ambulate for longer distances and be able to tolerate surgery to repair the L knee. Baseline: Global 4/5 strength in the R LE, L LE MMT deferred due to the patella being broken at this time. 4/17 improved strength on the RLE and LLE with knee extension 4-/5, hip flexion and knee flexion 4/5 5/13: BLE 4 to 4+/5 except R hip flexion 4-/5    Goal status: IN PROGRESS   5.  Patient (> 24 years old) will complete five times sit to stand test in < 15 seconds indicating an increased LE strength and improved balance. Baseline: 23.43 sec 4/17: 16.53sec from slightly elevatedd mat table  5/13: 13.72 sec with 1 inch pad in seat.  5/29: 13.9sec with 1 inch pad. 16.50 in standard seat.  Goal status: in progress.    6.  Patient will reduce timed up and go to <11 seconds to reduce fall risk and demonstrate improved transfer/gait ability. Baseline: 23.33 sec 4/17: 13.61 average of 2 trials. 5/13 9.86 sec   Goal status: MET  7.  Pt will improve BERG by at least 3 points in order to demonstrate clinically significant improvement in balance.   Baseline: 43 4/17: 48/56 07/27/2022 49/56  Goal status: MET      ASSESSMENT:  CLINICAL IMPRESSION:  Patient presents to physical therapy with good motivation for completion of physical therapy activities.  Physical therapy session focused on pain modulation and improved range of motion bilateral shoulders.  Patient continues to demonstrate significantly reduced range of motion on the right upper extremity compared to left upper extremity.  STM and trigger point release provided to the posterior capsule of the right shoulder.  Mild improvement in shoulder flexion range of motion at end of PT session.  Transfer training from various this seated heights and upper extremity support from thighs with supervision assist on this day.  Pt will continue to benefit from skilled physical therapy intervention to address impairments, improve QOL, and attain therapy goals.     OBJECTIVE IMPAIRMENTS: Abnormal gait, decreased activity tolerance, decreased balance, decreased endurance, decreased knowledge of use of DME, decreased mobility, difficulty walking, decreased ROM, decreased strength, hypomobility, impaired flexibility, impaired UE functional use, and pain  ACTIVITY LIMITATIONS: carrying, lifting, bending, sitting, standing, squatting, stairs, transfers, bed mobility, bathing, toileting, dressing, reach over head, hygiene/grooming,  and locomotion level  PARTICIPATION LIMITATIONS: meal prep, cleaning, laundry, driving, shopping, community activity, occupation, and yard work  PERSONAL FACTORS: Time since onset of injury/illness/exacerbation and 3+ comorbidities: Hx of breast cancer, bone cancer, thyroid disease  are also affecting patient's functional outcome.   REHAB POTENTIAL: Fair due to multiple joints needing to be targeted and pt's current bone cancer that is causing her joints to stiffen and become more rigid.    CLINICAL DECISION MAKING: Evolving/moderate complexity  EVALUATION COMPLEXITY:  Moderate  PLAN:  PT FREQUENCY: 2x/week  PT DURATION: 12 weeks  PLANNED INTERVENTIONS: Therapeutic exercises, Therapeutic activity, Neuromuscular re-education, Balance training, Gait training, Patient/Family education, Self Care, Joint mobilization, Stair training, Vestibular training, Canalith repositioning, DME instructions, Aquatic Therapy, Dry Needling, Spinal mobilization, Cryotherapy, Moist heat, Manual therapy, and Re-evaluation  PLAN FOR NEXT SESSION:   Allow pt identifies as most pressing issue (shoulder vs neck, vs LE) and treat with strengthening or stretching program to assist with ROM and pain modulation.   Grier Rocher PT, DPT  Physical Therapist - [place]  [facility]  5:03 PM 08/20/22     Note: Portions of this document were prepared using Dragon voice recognition software and although reviewed may contain unintentional dictation errors in syntax, grammar, or spelling.

## 2022-08-21 ENCOUNTER — Other Ambulatory Visit (HOSPITAL_COMMUNITY): Payer: Self-pay

## 2022-08-26 ENCOUNTER — Ambulatory Visit: Payer: Medicaid Other | Admitting: Physical Therapy

## 2022-08-26 ENCOUNTER — Other Ambulatory Visit: Payer: Self-pay

## 2022-08-26 ENCOUNTER — Inpatient Hospital Stay: Payer: Medicaid Other | Admitting: Dietician

## 2022-08-26 DIAGNOSIS — M6281 Muscle weakness (generalized): Secondary | ICD-10-CM

## 2022-08-26 DIAGNOSIS — R29898 Other symptoms and signs involving the musculoskeletal system: Secondary | ICD-10-CM

## 2022-08-26 DIAGNOSIS — M542 Cervicalgia: Secondary | ICD-10-CM

## 2022-08-26 DIAGNOSIS — M25612 Stiffness of left shoulder, not elsewhere classified: Secondary | ICD-10-CM

## 2022-08-26 DIAGNOSIS — M436 Torticollis: Secondary | ICD-10-CM

## 2022-08-26 DIAGNOSIS — R262 Difficulty in walking, not elsewhere classified: Secondary | ICD-10-CM

## 2022-08-26 DIAGNOSIS — M25562 Pain in left knee: Secondary | ICD-10-CM

## 2022-08-26 DIAGNOSIS — M25611 Stiffness of right shoulder, not elsewhere classified: Secondary | ICD-10-CM

## 2022-08-26 NOTE — Progress Notes (Signed)
Nutrition Assessment   Reason for Assessment: Wt loss   ASSESSMENT: 62 year old female with metastatic breast cancer (diagnosed 2018). She is currently receiving Faslodex with [facility].   Met with patient and care giver in office. Patient reports she has a great appetite and eats 3 meals daily. Some times she will snack. She has been working with PT over the last 3 months. Patient reports she is feeling stronger and gaining muscle. She is concerned about her weight loss. Patient does not feel she is eating the right foods. Patient endorses allergies. She has lots of phlegm after eating certain foods (dairy, fried foods). Patient is planning to discuss this with PCP at upcoming appointment.    Nutrition Focused Physical Exam: deferred   Medications: D3, gabapentin, ativan, zofran, synthroid   Labs: no recent labs for review   Anthropometrics: Patient recent weight is within usual weight   Height: 5'10" Weight: 147 lb 1.6 oz (5/30) UBW: 145-152 lb (last 8 months) BMI: 21.11    NUTRITION DIAGNOSIS: Food and nutrition related knowledge deficit related to cancer as evidenced by no prior need for associated nutrition education    INTERVENTION:  Discussed strategies for weight maintenance. Recommend patient have high protein snack after PT and at bedtime - handout with ideas provided Educated on foods with protein, encouraged protein source with all meals Suggested drinking oral nutrition supplement for added calories and protein - samples provided  All questions answered. Patient appreciative Contact information provided    MONITORING, EVALUATION, GOAL: weight trends, intake   Next Visit: No follow-up scheduled. Patient will contact with nutrition questions/concerns

## 2022-08-26 NOTE — Therapy (Signed)
OUTPATIENT PHYSICAL THERAPY NEURO TREATMENT     Patient Name: Kathryn Lucas MRN: [id number] DOB:[birth year], 62 y.o., female Today's Date: 08/26/2022   PCP: Ardean Larsen, MD REFERRING PROVIDER: [name], MD  END OF SESSION:  PT End of Session - 08/26/22 1110     Visit Number 22    Number of Visits 40    Date for PT Re-Evaluation 10/19/22    Authorization Type [employer]    Progress Note Due on Visit 30    PT Start Time 1108    PT Stop Time 1146    PT Time Calculation (min) 38 min    Equipment Utilized During Treatment Gait belt    Activity Tolerance Patient tolerated treatment well;Patient limited by pain    Behavior During Therapy WFL for tasks assessed/performed                   Past Medical History:  Diagnosis Date   Breast cancer (HCC)    left breast cancer   Cancer (HCC) 09/2017   left breast cancer   Family history of breast cancer    Hypothyroidism    Personal history of radiation therapy 2019   Thyroid disease    Past Surgical History:  Procedure Laterality Date   BREAST BIOPSY Left 2018   BREAST BIOPSY Left 2019   BREAST RECONSTRUCTION WITH PLACEMENT OF TISSUE EXPANDER AND ALLODERM Left 10/26/2017   Procedure: LEFT BREAST RECONSTRUCTION WITH PLACEMENT OF TISSUE EXPANDER AND ALLODERM;  Surgeon: Glenna Fellows, MD;  Location: [facility];  Service: Plastics;  Laterality: Left;   MASTECTOMY Left 2019   MASTECTOMY WITH RADIOACTIVE SEED GUIDED EXCISION AND AXILLARY SENTINEL LYMPH NODE BIOPSY Left 10/26/2017   Procedure: LEFT MASTECTOMY WITH SEED TARGETED  LEFT AXILLARY LYMPH NODE EXCISION AND LEFT SENTINEL LYMPH NODE BIOPSY;  Surgeon: Almond Lint, MD;  Location: [facility];  Service: General;  Laterality: Left;   TONSILLECTOMY     WISDOM TOOTH EXTRACTION     Patient Active Problem List   Diagnosis Date Noted   Metastatic malignant neoplasm (HCC) 12/19/2021   Family history of breast cancer    History of  therapeutic radiation 04/27/2018   Breast cancer, left breast (HCC) 10/26/2017   Malignant neoplasm of lower-outer quadrant of left breast of female, estrogen receptor positive (HCC) 07/15/2016   Plantar fasciitis 07/30/2015   Metatarsalgia 10/18/2014    ONSET DATE: September 2023  REFERRING DIAG: C79.51 (ICD-10-CM) - Malignant neoplasm metastatic to bone (HCC)   THERAPY DIAG:  Difficulty in walking, not elsewhere classified  Muscle weakness (generalized)  Decreased range of motion of right shoulder  Stiffness of cervical spine  Decreased range of motion of left shoulder  Painful cervical ROM  Acute pain of left knee  Leg weakness, bilateral  Rationale for Evaluation and Treatment: Rehabilitation  SUBJECTIVE:  SUBJECTIVE STATEMENT:  Patient reports that she is doing well. Mild soreness in the R shoulder with movement.  Patient reports that she will have CT scan on July 1.  States that she is continuing to have problems with weight gain, but plans to meet with nutritionist this afternoon.   Pt accompanied by: self PERTINENT HISTORY:   Pt is 62 y.o. female that is seeking therapy for weakness of the R LE.  Pt tripped over the threshold of her door back in September of 2023, and had a transverse fracture of the L patella when she fell on it.  She was recently diagnosed with bone cancer back in October of 2023.  Pt notes that she has more stiffness in the neck and pelvis region because the cancer is located in those locations.  Pt is currently utilizing a hinged knee brace that she received from a friend for support of the L knee.  Secure Chat with referring provider [name], MD, and asked for clarification on any precautions were present due to the pt's cancer diagnosis  Pt is cleared to  have modalities performed including dry needling and heat applied per MD.   PAIN:  Are you having pain? No  PRECAUTIONS: Other: Cancer  WEIGHT BEARING RESTRICTIONS: Pt denies having any weightbearing restrictions on the knee  FALLS: Has patient fallen in last 6 months? Yes. Number of falls 1  LIVING ENVIRONMENT: Lives with: lives alone Lives in: House/apartment Stairs: No Has following equipment at home: Dan Humphreys - 4 wheeled, shower chair, and elevated toilet with bars on the side.  Pt is looking at putting in grab bars in the shower, but has assistant that helps with showers.  PLOF: Independent  PATIENT GOALS: To get stronger, be able to walk better.    OBJECTIVE:   DIAGNOSTIC FINDINGS:   CLINICAL DATA:  Left knee pain   EXAM: LEFT KNEE - 1-2 VIEW  COMPARISON:  October 2022  FINDINGS: Nonunion of left patellar fracture. No new acute fracture. Degenerative changes of the knee demonstrated by subchondral lucencies of the distal femur. Visualized soft tissues are unremarkable.  IMPRESSION: Nonunion of left patellar fracture.   COGNITION: Overall cognitive status: Within functional limits for tasks assessed   SENSATION: WFL   POSTURE: No Significant postural limitations.  Pt is however unable to rotate her neck or move her hips as much due to the cancer in the pt.    CERVICAL ROM:   Active ROM A/PROM (deg) eval AROM 4/17  AROM 5/  Flexion 36    Extension 16    Right lateral flexion 11    Left lateral flexion 10    Right rotation 16 28 32  Left rotation 26 39  28   (Blank rows = not tested)   LOWER EXTREMITY ROM:     Active  Right Eval Left Eval  Knee flexion 85 deg deferred  Knee extension 17 deg lacking  deferred    LOWER EXTREMITY MMT:    MMT Right Eval Left Eval  Hip flexion 4+ 4  Knee flexion 4+ 4  Knee extension 4+ 4-    UPPER EXTREMITY ROM:  Active ROM Right eval Left eval R 4/17 L 4/17  R 5/29 L 5/29  Shoulder flexion 62  deg 99 deg 76 105  75 104  Shoulder scaption 60 deg 56 deg 76  91 80 95    UPPER EXTREMITY MMT:  MMT Right eval Left eval  Shoulder flexion 3+ with pain 4  Shoulder abduction 3+ with pain 4  Grip strength 28# 38#     BED MOBILITY:  Supine to sit Modified independence, extra time necessary.   TRANSFERS: Assistive device utilized: None  Sit to stand: Modified independence Stand to sit: Modified independence Chair to chair: Modified independence     GAIT: Gait pattern: step through pattern, decreased step length- Right, decreased stance time- Left, and decreased stride length Distance walked: 40' Assistive device utilized: None Level of assistance: Modified independence Comments: Pt ambulates with significant stiffness due to the hinged knee brace on the L LE as well as the stiffness that she has in her joints from the cancer.  FUNCTIONAL TESTS:  5 times sit to stand: 23.43 sec from elevated mat table; 4/17: 16.53sec from slightly elevated mat table   Timed up and go (TUG): 23.33 sec; 4/17: 13.61 average of 2 trials  2 minute walk test: 211 10 meter walk test: 18.33 sec Berg Balance Scale: 43/56; 4/17: 48/56     PATIENT SURVEYS:  FOTO 46/59  TODAY'S TREATMENT: DATE: 08/26/22    NuStep level 1-2, 2 minutes bilateral upper extremity and bilateral lower extremity.  2 minutes bilateral lower extremity only.  Patient reports increased soreness after first 2 minutes of reciprocal movement training in her right shoulder.   Gait training through rehab gym x 70 feet with no assistive device and distant supervision assist from PT.  Circuit training performed with 4 pound ankle weights. Long arc quads 2 x 10 Hip flexion 2 x 10 Hip abduction 2 x 12 green Theragran Ankle dorsiflexion plantarflexion 2 x 15 Ankle plantarflexion x 15 degrees Sit to stand 2 x 6 Gait with 4 pound ankle weights x 300 feet after first bout circuit training.  150 feet after second bouts of  circuit. Supervision assist from PT for weighted gait training patient continues to demonstrate improved gait pattern with decreased truncal sway and improved reciprocal upper extremity movement.     PATIENT EDUCATION: Education details: Pt educated throughout session about proper posture and technique with exercises. Improved exercise technique, movement at target joints, use of target muscles after min to mod verbal, visual, tactile cues. Person educated: Patient Education method: Explanation Education comprehension: verbalized understanding  HOME EXERCISE PROGRAM:  Access Code: [email or web address] URL: [email or web address] Date: 05/19/2022 Prepared by: Grier Rocher  Exercises - Seated Hip Abduction with Resistance  - 1 x daily - 7 x weekly - 3 sets - 10 reps - Standing Hip Extension Kicks  - 1 x daily - 7 x weekly - 3 sets - 10 reps - Standing Hip Abduction Kicks  - 1 x daily - 7 x weekly - 3 sets - 10 reps  Access Code: [email or web address] URL: [email or web address] Date: 05/14/2022 Prepared by: Tomasa Hose  Exercises - Isometric Shoulder Flexion at Wall  - 1 x daily - 7 x weekly - 3 sets - 10 reps - Isometric Shoulder Extension at Wall  - 1 x daily - 7 x weekly - 3 sets - 10 reps - Isometric Shoulder Abduction at Wall  - 1 x daily - 7 x weekly - 3 sets - 10 reps - Isometric Shoulder Adduction  - 1 x daily - 7 x weekly - 3 sets - 10 reps   GOALS:  Goals reviewed with patient? Yes  SHORT TERM GOALS: Target date: 06/03/2022  Pt will be independent with HEP in order to demonstrate increased ability to perform tasks related to occupation/hobbies. Baseline: Not given HEP at initial evaluation. Goal status:  MET   LONG TERM GOALS: Target date: 07/29/2022  Patient will demonstrate improved function as evidenced by a score of 59 on FOTO measure for full participation in activities at home and in the community. Baseline: Evaluation: 46, 4/17: 68. 5/13: 58 Goal  status: in progress   2.  Patient to demonstrate increased cervical rotation ROM to be 65 deg in order to return to PLOF and improving safety while driving. Baseline: 16/26 deg 4/17 28/39 DEG 5/13 28/29  Goal status: IN PROGRESS  3.  Pt to improve B shoulder ROM to be WFL (Flexion: 120 deg; Abduction: 130 deg) in order to improve ability to complete tasks Baseline: R/L Shoulder Flex: 62/99; R/L Shoulder Scaption: 60/56 4/17: shoulder flexion 76/105 shoulder scaption 76/91 5/13 shoulder flexion 78/92 shoulder scaption 85/108.  Goal status: IN PROGRESS  4.  Pt to improve overall strength of the LE's in order to ambulate for longer distances and be able to tolerate surgery to repair the L knee. Baseline: Global 4/5 strength in the R LE, L LE MMT deferred due to the patella being broken at this time. 4/17 improved strength on the RLE and LLE with knee extension 4-/5, hip flexion and knee flexion 4/5 5/13: BLE 4 to 4+/5 except R hip flexion 4-/5    Goal status: IN PROGRESS   5.  Patient (> 33 years old) will complete five times sit to stand test in < 15 seconds indicating an increased LE strength and improved balance. Baseline: 23.43 sec 4/17: 16.53sec from slightly elevatedd mat table  5/13: 13.72 sec with 1 inch pad in seat.  5/29: 13.9sec with 1 inch pad. 16.50 in standard seat.  Goal status: in progress.    6.  Patient will reduce timed up and go to <11 seconds to reduce fall risk and demonstrate improved transfer/gait ability. Baseline: 23.33 sec 4/17: 13.61 average of 2 trials. 5/13 9.86 sec   Goal status: MET  7.  Pt will improve BERG by at least 3 points in order to demonstrate clinically significant improvement in balance.  Baseline: 43 4/17: 48/56 07/27/2022 49/56  Goal status: MET      ASSESSMENT:  CLINICAL IMPRESSION:  Patient presents to physical therapy with good motivation for completion of physical therapy activities.  PT session focused on bilateral lower extremity  strengthening with circuit training.  Patient able to complete all exercises reading difficulty as easy and first bout medium and second bout.  Mild shortness of breath with weighted gait training heart rate assessed after second bout at 117 bpm.  No increased pain reported by patient at end of session.  Pt will continue to benefit from skilled physical therapy intervention to address impairments, improve QOL, and attain therapy goals.     OBJECTIVE IMPAIRMENTS: Abnormal gait, decreased activity tolerance, decreased balance, decreased endurance, decreased knowledge of use of DME, decreased mobility, difficulty walking, decreased ROM, decreased strength, hypomobility, impaired flexibility, impaired UE functional use, and pain  ACTIVITY LIMITATIONS: carrying, lifting, bending, sitting, standing, squatting, stairs, transfers, bed mobility, bathing, toileting, dressing, reach over head, hygiene/grooming, and locomotion level  PARTICIPATION LIMITATIONS: meal prep, cleaning, laundry, driving, shopping, community activity, occupation, and yard work  PERSONAL FACTORS: Time since onset of injury/illness/exacerbation and 3+ comorbidities: Hx of breast cancer, bone cancer, thyroid disease  are also affecting patient's functional outcome.   REHAB POTENTIAL: Fair due to multiple joints needing to be targeted and pt's current bone cancer that is causing her joints to stiffen and become more rigid.  CLINICAL DECISION MAKING: Evolving/moderate complexity  EVALUATION COMPLEXITY: Moderate  PLAN:  PT FREQUENCY: 2x/week  PT DURATION: 12 weeks  PLANNED INTERVENTIONS: Therapeutic exercises, Therapeutic activity, Neuromuscular re-education, Balance training, Gait training, Patient/Family education, Self Care, Joint mobilization, Stair training, Vestibular training, Canalith repositioning, DME instructions, Aquatic Therapy, Dry Needling, Spinal mobilization, Cryotherapy, Moist heat, Manual therapy, and  Re-evaluation  PLAN FOR NEXT SESSION:   Allow pt identifies as most pressing issue (shoulder vs neck, vs LE) and treat with strengthening or stretching program to assist with ROM and pain modulation.   Grier Rocher PT, DPT  Physical Therapist - [place]  [facility]  1:24 PM 08/26/22     Note: Portions of this document were prepared using Dragon voice recognition software and although reviewed may contain unintentional dictation errors in syntax, grammar, or spelling.

## 2022-08-28 ENCOUNTER — Ambulatory Visit: Payer: Medicaid Other | Admitting: Physical Therapy

## 2022-08-28 DIAGNOSIS — M25562 Pain in left knee: Secondary | ICD-10-CM

## 2022-08-28 DIAGNOSIS — M25611 Stiffness of right shoulder, not elsewhere classified: Secondary | ICD-10-CM

## 2022-08-28 DIAGNOSIS — M25612 Stiffness of left shoulder, not elsewhere classified: Secondary | ICD-10-CM

## 2022-08-28 DIAGNOSIS — R262 Difficulty in walking, not elsewhere classified: Secondary | ICD-10-CM

## 2022-08-28 DIAGNOSIS — R29898 Other symptoms and signs involving the musculoskeletal system: Secondary | ICD-10-CM

## 2022-08-28 DIAGNOSIS — M436 Torticollis: Secondary | ICD-10-CM

## 2022-08-28 DIAGNOSIS — M6281 Muscle weakness (generalized): Secondary | ICD-10-CM

## 2022-08-28 DIAGNOSIS — M542 Cervicalgia: Secondary | ICD-10-CM

## 2022-08-28 NOTE — Therapy (Signed)
OUTPATIENT PHYSICAL THERAPY NEURO TREATMENT     Patient Name: Kathryn Lucas MRN: [id number] DOB:[birth year], 62 y.o., female Today's Date: 08/28/2022   PCP: Ardean Larsen, MD REFERRING PROVIDER: [name], MD  END OF SESSION:  PT End of Session - 08/28/22 1120     Visit Number 23    Number of Visits 40    Date for PT Re-Evaluation 10/19/22    Authorization Type [employer]    Progress Note Due on Visit 30    PT Start Time 1052    PT Stop Time 1132    PT Time Calculation (min) 40 min    Equipment Utilized During Treatment Gait belt    Activity Tolerance Patient tolerated treatment well;Patient limited by pain    Behavior During Therapy WFL for tasks assessed/performed                   Past Medical History:  Diagnosis Date   Breast cancer (HCC)    left breast cancer   Cancer (HCC) 09/2017   left breast cancer   Family history of breast cancer    Hypothyroidism    Personal history of radiation therapy 2019   Thyroid disease    Past Surgical History:  Procedure Laterality Date   BREAST BIOPSY Left 2018   BREAST BIOPSY Left 2019   BREAST RECONSTRUCTION WITH PLACEMENT OF TISSUE EXPANDER AND ALLODERM Left 10/26/2017   Procedure: LEFT BREAST RECONSTRUCTION WITH PLACEMENT OF TISSUE EXPANDER AND ALLODERM;  Surgeon: Glenna Fellows, MD;  Location: [facility];  Service: Plastics;  Laterality: Left;   MASTECTOMY Left 2019   MASTECTOMY WITH RADIOACTIVE SEED GUIDED EXCISION AND AXILLARY SENTINEL LYMPH NODE BIOPSY Left 10/26/2017   Procedure: LEFT MASTECTOMY WITH SEED TARGETED  LEFT AXILLARY LYMPH NODE EXCISION AND LEFT SENTINEL LYMPH NODE BIOPSY;  Surgeon: Almond Lint, MD;  Location:  SUR[facility]ERY CENTER;  Service: General;  Laterality: Left;   TONSILLECTOMY     WISDOM TOOTH EXTRACTION     Patient Active Problem List   Diagnosis Date Noted   Metastatic malignant neoplasm (HCC) 12/19/2021   Family history of breast cancer    History of  therapeutic radiation 04/27/2018   Breast cancer, left breast (HCC) 10/26/2017   Malignant neoplasm of lower-outer quadrant of left breast of female, estrogen receptor positive (HCC) 07/15/2016   Plantar fasciitis 07/30/2015   Metatarsalgia 10/18/2014    ONSET DATE: September 2023  REFERRING DIAG: C79.51 (ICD-10-CM) - Malignant neoplasm metastatic to bone (HCC)   THERAPY DIAG:  Difficulty in walking, not elsewhere classified  Muscle weakness (generalized)  Decreased range of motion of right shoulder  Stiffness of cervical spine  Decreased range of motion of left shoulder  Painful cervical ROM  Acute pain of left knee  Leg weakness, bilateral  Rationale for Evaluation and Treatment: Rehabilitation  SUBJECTIVE:  SUBJECTIVE STATEMENT:  Patient reports that she is doing well. Mild stiffness in Cspine due to sleeping position overnight. Patient reports that she will have CT scan on July 1. Appointment with nutritionist went well. Recommendations for increased protein after PA and prior to bed.    Pt accompanied by: self PERTINENT HISTORY:   Pt is 62 y.o. female that is seeking therapy for weakness of the R LE.  Pt tripped over the threshold of her door back in September of 2023, and had a transverse fracture of the L patella when she fell on it.  She was recently diagnosed with bone cancer back in October of 2023.  Pt notes that she has more stiffness in the neck and pelvis region because the cancer is located in those locations.  Pt is currently utilizing a hinged knee brace that she received from a friend for support of the L knee.  Secure Chat with referring provider [name], MD, and asked for clarification on any precautions were present due to the pt's cancer diagnosis  Pt is cleared to  have modalities performed including dry needling and heat applied per MD.   PAIN:  Are you having pain? No  PRECAUTIONS: Other: Cancer  WEIGHT BEARING RESTRICTIONS: Pt denies having any weightbearing restrictions on the knee  FALLS: Has patient fallen in last 6 months? Yes. Number of falls 1  LIVING ENVIRONMENT: Lives with: lives alone Lives in: House/apartment Stairs: No Has following equipment at home: Dan Humphreys - 4 wheeled, shower chair, and elevated toilet with bars on the side.  Pt is looking at putting in grab bars in the shower, but has assistant that helps with showers.  PLOF: Independent  PATIENT GOALS: To get stronger, be able to walk better.    OBJECTIVE:   DIAGNOSTIC FINDINGS:   CLINICAL DATA:  Left knee pain   EXAM: LEFT KNEE - 1-2 VIEW  COMPARISON:  October 2022  FINDINGS: Nonunion of left patellar fracture. No new acute fracture. Degenerative changes of the knee demonstrated by subchondral lucencies of the distal femur. Visualized soft tissues are unremarkable.  IMPRESSION: Nonunion of left patellar fracture.   COGNITION: Overall cognitive status: Within functional limits for tasks assessed   SENSATION: WFL   POSTURE: No Significant postural limitations.  Pt is however unable to rotate her neck or move her hips as much due to the cancer in the pt.    CERVICAL ROM:   Active ROM A/PROM (deg) eval AROM 4/17  AROM 5/  Flexion 36    Extension 16    Right lateral flexion 11    Left lateral flexion 10    Right rotation 16 28 32  Left rotation 26 39  28   (Blank rows = not tested)   LOWER EXTREMITY ROM:     Active  Right Eval Left Eval  Knee flexion 85 deg deferred  Knee extension 17 deg lacking  deferred    LOWER EXTREMITY MMT:    MMT Right Eval Left Eval  Hip flexion 4+ 4  Knee flexion 4+ 4  Knee extension 4+ 4-    UPPER EXTREMITY ROM:  Active ROM Right eval Left eval R 4/17 L 4/17  R 5/29 L 5/29  Shoulder flexion 62  deg 99 deg 76 105  75 104  Shoulder scaption 60 deg 56 deg 76  91 80 95    UPPER EXTREMITY MMT:  MMT Right eval Left eval  Shoulder flexion 3+ with pain 4  Shoulder abduction 3+ with  pain 4  Grip strength 28# 38#     BED MOBILITY:  Supine to sit Modified independence, extra time necessary.   TRANSFERS: Assistive device utilized: None  Sit to stand: Modified independence Stand to sit: Modified independence Chair to chair: Modified independence     GAIT: Gait pattern: step through pattern, decreased step length- Right, decreased stance time- Left, and decreased stride length Distance walked: 40' Assistive device utilized: None Level of assistance: Modified independence Comments: Pt ambulates with significant stiffness due to the hinged knee brace on the L LE as well as the stiffness that she has in her joints from the cancer.  FUNCTIONAL TESTS:  5 times sit to stand: 23.43 sec from elevated mat table; 4/17: 16.53sec from slightly elevated mat table   Timed up and go (TUG): 23.33 sec; 4/17: 13.61 average of 2 trials  2 minute walk test: 211 10 meter walk test: 18.33 sec Berg Balance Scale: 43/56; 4/17: 48/56     PATIENT SURVEYS:  FOTO 46/59  TODAY'S TREATMENT: DATE: 08/28/22    Scifit UBE 2.5 min forward/2.5 min reverse.  Cues to keep RPM >45 UT stretch with overpressure from PT 3 x 20sec.  Chin tuck 10 sec hold x 6.  Step up x 10 bil  Reverse step up x 10 on the RLE and x 7 on the LLE due to fatigue.  Laterall stepping up/down airex pad, 6inch step, 4inch step in parallel bars x 5 bil  Foot tap on 6inch step from airex pad x 12 bil with no UE support   Therapeutic rest break required throughout session; min cues for decreased use of UE support to increase demand on BLE.   PATIENT EDUCATION: Education details: Pt educated throughout session about proper posture and technique with exercises. Improved exercise technique, movement at target joints, use of  target muscles after min to mod verbal, visual, tactile cues. Person educated: Patient Education method: Explanation Education comprehension: verbalized understanding  HOME EXERCISE PROGRAM:  Access Code: [email or web address] URL: [email or web address] Date: 05/19/2022 Prepared by: Grier Rocher  Exercises - Seated Hip Abduction with Resistance  - 1 x daily - 7 x weekly - 3 sets - 10 reps - Standing Hip Extension Kicks  - 1 x daily - 7 x weekly - 3 sets - 10 reps - Standing Hip Abduction Kicks  - 1 x daily - 7 x weekly - 3 sets - 10 reps  Access Code: [email or web address] URL: [email or web address] Date: 05/14/2022 Prepared by: Tomasa Hose  Exercises - Isometric Shoulder Flexion at Wall  - 1 x daily - 7 x weekly - 3 sets - 10 reps - Isometric Shoulder Extension at Wall  - 1 x daily - 7 x weekly - 3 sets - 10 reps - Isometric Shoulder Abduction at Wall  - 1 x daily - 7 x weekly - 3 sets - 10 reps - Isometric Shoulder Adduction  - 1 x daily - 7 x weekly - 3 sets - 10 reps   GOALS:  Goals reviewed with patient? Yes  SHORT TERM GOALS: Target date: 06/03/2022  Pt will be independent with HEP in order to demonstrate increased ability to perform tasks related to occupation/hobbies. Baseline: Not given HEP at initial evaluation. Goal status: MET   LONG TERM GOALS: Target date: 07/29/2022  Patient will demonstrate improved function as evidenced by a score of 59 on FOTO measure for full participation in activities at home and in the community. Baseline: Evaluation: 46, 4/17: 68. 5/13: 58 Goal status: in  progress   2.  Patient to demonstrate increased cervical rotation ROM to be 65 deg in order to return to PLOF and improving safety while driving. Baseline: 16/26 deg 4/17 28/39 DEG 5/13 28/29  Goal status: IN PROGRESS  3.  Pt to improve B shoulder ROM to be WFL (Flexion: 120 deg; Abduction: 130 deg) in order to improve ability to complete tasks Baseline: R/L Shoulder Flex:  62/99; R/L Shoulder Scaption: 60/56 4/17: shoulder flexion 76/105 shoulder scaption 76/91 5/13 shoulder flexion 78/92 shoulder scaption 85/108.  Goal status: IN PROGRESS  4.  Pt to improve overall strength of the LE's in order to ambulate for longer distances and be able to tolerate surgery to repair the L knee. Baseline: Global 4/5 strength in the R LE, L LE MMT deferred due to the patella being broken at this time. 4/17 improved strength on the RLE and LLE with knee extension 4-/5, hip flexion and knee flexion 4/5 5/13: BLE 4 to 4+/5 except R hip flexion 4-/5    Goal status: IN PROGRESS   5.  Patient (> 26 years old) will complete five times sit to stand test in < 15 seconds indicating an increased LE strength and improved balance. Baseline: 23.43 sec 4/17: 16.53sec from slightly elevatedd mat table  5/13: 13.72 sec with 1 inch pad in seat.  5/29: 13.9sec with 1 inch pad. 16.50 in standard seat.  Goal status: in progress.    6.  Patient will reduce timed up and go to <11 seconds to reduce fall risk and demonstrate improved transfer/gait ability. Baseline: 23.33 sec 4/17: 13.61 average of 2 trials. 5/13 9.86 sec   Goal status: MET  7.  Pt will improve BERG by at least 3 points in order to demonstrate clinically significant improvement in balance.  Baseline: 43 4/17: 48/56 07/27/2022 49/56  Goal status: MET      ASSESSMENT:  CLINICAL IMPRESSION:  Patient presents to physical therapy with good motivation for completion of physical therapy activities.  PT session focused on BLE strength and endurance, as well as pain management in the Cspine. Improved use  of LLE for eccentric control of step management forward/reverse/ lateral. Pt encouraged to adhere to advice from nutritionist to improve muscle growth and weight gain. Pt will continue to benefit from skilled physical therapy intervention to address impairments, improve QOL, and attain therapy goals.     OBJECTIVE IMPAIRMENTS:  Abnormal gait, decreased activity tolerance, decreased balance, decreased endurance, decreased knowledge of use of DME, decreased mobility, difficulty walking, decreased ROM, decreased strength, hypomobility, impaired flexibility, impaired UE functional use, and pain  ACTIVITY LIMITATIONS: carrying, lifting, bending, sitting, standing, squatting, stairs, transfers, bed mobility, bathing, toileting, dressing, reach over head, hygiene/grooming, and locomotion level  PARTICIPATION LIMITATIONS: meal prep, cleaning, laundry, driving, shopping, community activity, occupation, and yard work  PERSONAL FACTORS: Time since onset of injury/illness/exacerbation and 3+ comorbidities: Hx of breast cancer, bone cancer, thyroid disease  are also affecting patient's functional outcome.   REHAB POTENTIAL: Fair due to multiple joints needing to be targeted and pt's current bone cancer that is causing her joints to stiffen and become more rigid.    CLINICAL DECISION MAKING: Evolving/moderate complexity  EVALUATION COMPLEXITY: Moderate  PLAN:  PT FREQUENCY: 2x/week  PT DURATION: 12 weeks  PLANNED INTERVENTIONS: Therapeutic exercises, Therapeutic activity, Neuromuscular re-education, Balance training, Gait training, Patient/Family education, Self Care, Joint mobilization, Stair training, Vestibular training, Canalith repositioning, DME instructions, Aquatic Therapy, Dry Needling, Spinal mobilization, Cryotherapy, Moist heat, Manual therapy, and  Re-evaluation  PLAN FOR NEXT SESSION:   Allow pt identifies as most pressing issue (shoulder vs neck, vs LE) and treat with strengthening or stretching program to assist with ROM and pain modulation.   Grier Rocher PT, DPT  Physical Therapist - [facility]  11:55 AM 08/28/22

## 2022-09-01 ENCOUNTER — Ambulatory Visit: Payer: Medicaid Other

## 2022-09-01 DIAGNOSIS — M436 Torticollis: Secondary | ICD-10-CM

## 2022-09-01 DIAGNOSIS — M6281 Muscle weakness (generalized): Secondary | ICD-10-CM

## 2022-09-01 DIAGNOSIS — R29898 Other symptoms and signs involving the musculoskeletal system: Secondary | ICD-10-CM

## 2022-09-01 DIAGNOSIS — M25612 Stiffness of left shoulder, not elsewhere classified: Secondary | ICD-10-CM

## 2022-09-01 DIAGNOSIS — R262 Difficulty in walking, not elsewhere classified: Secondary | ICD-10-CM

## 2022-09-01 DIAGNOSIS — C50512 Malignant neoplasm of lower-outer quadrant of left female breast: Secondary | ICD-10-CM

## 2022-09-01 DIAGNOSIS — M25562 Pain in left knee: Secondary | ICD-10-CM

## 2022-09-01 DIAGNOSIS — Z170 Estrogen receptor positive status [ER+]: Secondary | ICD-10-CM

## 2022-09-01 DIAGNOSIS — M542 Cervicalgia: Secondary | ICD-10-CM

## 2022-09-01 DIAGNOSIS — M25611 Stiffness of right shoulder, not elsewhere classified: Secondary | ICD-10-CM

## 2022-09-01 NOTE — Therapy (Signed)
OUTPATIENT PHYSICAL THERAPY NEURO TREATMENT     Patient Name: Kathryn Lucas MRN: [id number] DOB:[birth year], 62 y.o., female Today's Date: 09/01/2022   PCP: Ardean Larsen, MD REFERRING PROVIDER: [name], MD  END OF SESSION:  PT End of Session - 09/01/22 1523     Visit Number 24    Number of Visits 40    Date for PT Re-Evaluation 10/19/22    Authorization Type [employer]    Progress Note Due on Visit 30    PT Start Time 1522    PT Stop Time 1602    PT Time Calculation (min) 40 min    Equipment Utilized During Treatment Gait belt    Activity Tolerance Patient tolerated treatment well;Patient limited by pain    Behavior During Therapy [facility] for tasks assessed/performed              Past Medical History:  Diagnosis Date   Breast cancer (HCC)    left breast cancer   Cancer (HCC) 09/2017   left breast cancer   Family history of breast cancer    Hypothyroidism    Personal history of radiation therapy 2019   Thyroid disease    Past Surgical History:  Procedure Laterality Date   BREAST BIOPSY Left 2018   BREAST BIOPSY Left 2019   BREAST RECONSTRUCTION WITH PLACEMENT OF TISSUE EXPANDER AND ALLODERM Left 10/26/2017   Procedure: LEFT BREAST RECONSTRUCTION WITH PLACEMENT OF TISSUE EXPANDER AND ALLODERM;  Surgeon: Glenna Fellows, MD;  Location: [facility];  Service: Plastics;  Laterality: Left;   MASTECTOMY Left 2019   MASTECTOMY WITH RADIOACTIVE SEED GUIDED EXCISION AND AXILLARY SENTINEL LYMPH NODE BIOPSY Left 10/26/2017   Procedure: LEFT MASTECTOMY WITH SEED TARGETED  LEFT AXILLARY LYMPH NODE EXCISION AND LEFT SENTINEL LYMPH NODE BIOPSY;  Surgeon: Almond Lint, MD;  Location: [facility];  Service: General;  Laterality: Left;   TONSILLECTOMY     WISDOM TOOTH EXTRACTION     Patient Active Problem List   Diagnosis Date Noted   Metastatic malignant neoplasm (HCC) 12/19/2021   Family history of breast cancer    History of  therapeutic radiation 04/27/2018   Breast cancer, left breast (HCC) 10/26/2017   Malignant neoplasm of lower-outer quadrant of left breast of female, estrogen receptor positive (HCC) 07/15/2016   Plantar fasciitis 07/30/2015   Metatarsalgia 10/18/2014    ONSET DATE: September 2023  REFERRING DIAG: C79.51 (ICD-10-CM) - Malignant neoplasm metastatic to bone (HCC)   THERAPY DIAG:  Difficulty in walking, not elsewhere classified  Muscle weakness (generalized)  Decreased range of motion of right shoulder  Stiffness of cervical spine  Decreased range of motion of left shoulder  Painful cervical ROM  Acute pain of left knee  Leg weakness, bilateral  Malignant neoplasm of lower-outer quadrant of left breast of female, estrogen receptor positive (HCC)  Rationale for Evaluation and Treatment: Rehabilitation  SUBJECTIVE:  SUBJECTIVE STATEMENT:  Pt reports today is the first time she has been down to PT without utilizing the transport chair.  Pt ambulated down to the clinic instead of using the chair and transportation.  Pt reports she is doing well since this therapist last saw her back in March.   Pt accompanied by: self PERTINENT HISTORY:   Pt is 62 y.o. female that is seeking therapy for weakness of the R LE.  Pt tripped over the threshold of her door back in September of 2023, and had a transverse fracture of the L patella when she fell on it.  She was recently diagnosed with bone cancer back in October of 2023.  Pt notes that she has more stiffness in the neck and pelvis region because the cancer is located in those locations.  Pt is currently utilizing a hinged knee brace that she received from a friend for support of the L knee.  Secure Chat with referring provider [name], MD, and asked  for clarification on any precautions were present due to the pt's cancer diagnosis  Pt is cleared to have modalities performed including dry needling and heat applied per MD.   PAIN:  Are you having pain? No  PRECAUTIONS: Other: Cancer  WEIGHT BEARING RESTRICTIONS: Pt denies having any weightbearing restrictions on the knee  FALLS: Has patient fallen in last 6 months? Yes. Number of falls 1  LIVING ENVIRONMENT: Lives with: lives alone Lives in: House/apartment Stairs: No Has following equipment at home: Dan Humphreys - 4 wheeled, shower chair, and elevated toilet with bars on the side.  Pt is looking at putting in grab bars in the shower, but has assistant that helps with showers.  PLOF: Independent  PATIENT GOALS: To get stronger, be able to walk better.    OBJECTIVE:   DIAGNOSTIC FINDINGS:   CLINICAL DATA:  Left knee pain   EXAM: LEFT KNEE - 1-2 VIEW  COMPARISON:  October 2022  FINDINGS: Nonunion of left patellar fracture. No new acute fracture. Degenerative changes of the knee demonstrated by subchondral lucencies of the distal femur. Visualized soft tissues are unremarkable.  IMPRESSION: Nonunion of left patellar fracture.   COGNITION: Overall cognitive status: Within functional limits for tasks assessed   SENSATION: WFL   POSTURE: No Significant postural limitations.  Pt is however unable to rotate her neck or move her hips as much due to the cancer in the pt.    CERVICAL ROM:   Active ROM A/PROM (deg) eval AROM 4/17  AROM 5/  Flexion 36    Extension 16    Right lateral flexion 11    Left lateral flexion 10    Right rotation 16 28 32  Left rotation 26 39  28   (Blank rows = not tested)   LOWER EXTREMITY ROM:     Active  Right Eval Left Eval  Knee flexion 85 deg deferred  Knee extension 17 deg lacking  deferred    LOWER EXTREMITY MMT:    MMT Right Eval Left Eval  Hip flexion 4+ 4  Knee flexion 4+ 4  Knee extension 4+ 4-    UPPER  EXTREMITY ROM:  Active ROM Right eval Left eval R 4/17 L 4/17  R 5/29 L 5/29  Shoulder flexion 62 deg 99 deg 76 105  75 104  Shoulder scaption 60 deg 56 deg 76  91 80 95    UPPER EXTREMITY MMT:  MMT Right eval Left eval  Shoulder flexion 3+ with pain 4  Shoulder abduction 3+ with pain 4  Grip strength 28# 38#     BED MOBILITY:  Supine to sit Modified independence, extra time necessary.   TRANSFERS: Assistive device utilized: None  Sit to stand: Modified independence Stand to sit: Modified independence Chair to chair: Modified independence     GAIT: Gait pattern: step through pattern, decreased step length- Right, decreased stance time- Left, and decreased stride length Distance walked: 40' Assistive device utilized: None Level of assistance: Modified independence Comments: Pt ambulates with significant stiffness due to the hinged knee brace on the L LE as well as the stiffness that she has in her joints from the cancer.  FUNCTIONAL TESTS:  5 times sit to stand: 23.43 sec from elevated mat table; 4/17: 16.53sec from slightly elevated mat table   Timed up and go (TUG): 23.33 sec; 4/17: 13.61 average of 2 trials  2 minute walk test: 211 10 meter walk test: 18.33 sec Berg Balance Scale: 43/56; 4/17: 48/56     PATIENT SURVEYS:  FOTO 46/59  TODAY'S TREATMENT: DATE: 09/01/22    TherEx:  Scifit UBE level 4, 2.5 min forward/2.5 min reverse for improved shoulder activation with good posture while therapist collected subjective information. Cues to keep RPM >45   Manual:  Supine STM to cervical region to increase extensibility of the paraspinals Supine suboccipital release technique to decrease cervicalgia Supine manual traction performed in order to increase joint space in cervical region for pain relief Supine cervical upglides/downglides, 30 sec bouts Supine UT/Levator stretch, 30 sec bouts to increase tissue extensibility of the cervical  region   PATIENT EDUCATION: Education details: Pt educated throughout session about proper posture and technique with exercises. Improved exercise technique, movement at target joints, use of target muscles after min to mod verbal, visual, tactile cues. Person educated: Patient Education method: Explanation Education comprehension: verbalized understanding  HOME EXERCISE PROGRAM:  Access Code: [email or web address] URL: [email or web address] Date: 05/19/2022 Prepared by: Grier Rocher  Exercises - Seated Hip Abduction with Resistance  - 1 x daily - 7 x weekly - 3 sets - 10 reps - Standing Hip Extension Kicks  - 1 x daily - 7 x weekly - 3 sets - 10 reps - Standing Hip Abduction Kicks  - 1 x daily - 7 x weekly - 3 sets - 10 reps  Access Code: [email or web address] URL: [email or web address] Date: 05/14/2022 Prepared by: Tomasa Hose  Exercises - Isometric Shoulder Flexion at Wall  - 1 x daily - 7 x weekly - 3 sets - 10 reps - Isometric Shoulder Extension at Wall  - 1 x daily - 7 x weekly - 3 sets - 10 reps - Isometric Shoulder Abduction at Wall  - 1 x daily - 7 x weekly - 3 sets - 10 reps - Isometric Shoulder Adduction  - 1 x daily - 7 x weekly - 3 sets - 10 reps   GOALS:  Goals reviewed with patient? Yes  SHORT TERM GOALS: Target date: 06/03/2022  Pt will be independent with HEP in order to demonstrate increased ability to perform tasks related to occupation/hobbies. Baseline: Not given HEP at initial evaluation. Goal status: MET   LONG TERM GOALS: Target date: 07/29/2022  Patient will demonstrate improved function as evidenced by a score of 59 on FOTO measure for full participation in activities at home and in the community. Baseline: Evaluation: 46, 4/17: 68. 5/13: 58 Goal status: in progress   2.  Patient to demonstrate increased cervical rotation ROM to be 65 deg in  order to return to PLOF and improving safety while driving. Baseline: 16/26 deg 4/17 28/39  DEG 5/13 28/29  Goal status: IN PROGRESS  3.  Pt to improve B shoulder ROM to be WFL (Flexion: 120 deg; Abduction: 130 deg) in order to improve ability to complete tasks Baseline: R/L Shoulder Flex: 62/99; R/L Shoulder Scaption: 60/56 4/17: shoulder flexion 76/105 shoulder scaption 76/91 5/13 shoulder flexion 78/92 shoulder scaption 85/108.  Goal status: IN PROGRESS  4.  Pt to improve overall strength of the LE's in order to ambulate for longer distances and be able to tolerate surgery to repair the L knee. Baseline: Global 4/5 strength in the R LE, L LE MMT deferred due to the patella being broken at this time. 4/17 improved strength on the RLE and LLE with knee extension 4-/5, hip flexion and knee flexion 4/5 5/13: BLE 4 to 4+/5 except R hip flexion 4-/5    Goal status: IN PROGRESS   5.  Patient (> 75 years old) will complete five times sit to stand test in < 15 seconds indicating an increased LE strength and improved balance. Baseline: 23.43 sec 4/17: 16.53sec from slightly elevatedd mat table  5/13: 13.72 sec with 1 inch pad in seat.  5/29: 13.9sec with 1 inch pad. 16.50 in standard seat.  Goal status: in progress.    6.  Patient will reduce timed up and go to <11 seconds to reduce fall risk and demonstrate improved transfer/gait ability. Baseline: 23.33 sec 4/17: 13.61 average of 2 trials. 5/13 9.86 sec   Goal status: MET  7.  Pt will improve BERG by at least 3 points in order to demonstrate clinically significant improvement in balance.  Baseline: 43 4/17: 48/56 07/27/2022 49/56  Goal status: MET      ASSESSMENT:  CLINICAL IMPRESSION:  Pt reports to therapy with increased neck stiffness and discomfort with ROM.  Pt elected to focus more on mobility of the neck and improving tightness felt.  Therapist assisted with supine manual approaches that targeted the specific tissues that were leading to the complications the pt was experiencing.  Pt responded well to the manual  therapy approaches noting a reduction in stiffness and the ability to perform greater cervical rotation following the exercises.   Pt will continue to benefit from skilled therapy to address remaining deficits in order to improve overall QoL and return to PLOF.       OBJECTIVE IMPAIRMENTS: Abnormal gait, decreased activity tolerance, decreased balance, decreased endurance, decreased knowledge of use of DME, decreased mobility, difficulty walking, decreased ROM, decreased strength, hypomobility, impaired flexibility, impaired UE functional use, and pain  ACTIVITY LIMITATIONS: carrying, lifting, bending, sitting, standing, squatting, stairs, transfers, bed mobility, bathing, toileting, dressing, reach over head, hygiene/grooming, and locomotion level  PARTICIPATION LIMITATIONS: meal prep, cleaning, laundry, driving, shopping, community activity, occupation, and yard work  PERSONAL FACTORS: Time since onset of injury/illness/exacerbation and 3+ comorbidities: Hx of breast cancer, bone cancer, thyroid disease  are also affecting patient's functional outcome.   REHAB POTENTIAL: Fair due to multiple joints needing to be targeted and pt's current bone cancer that is causing her joints to stiffen and become more rigid.    CLINICAL DECISION MAKING: Evolving/moderate complexity  EVALUATION COMPLEXITY: Moderate  PLAN:  PT FREQUENCY: 2x/week  PT DURATION: 12 weeks  PLANNED INTERVENTIONS: Therapeutic exercises, Therapeutic activity, Neuromuscular re-education, Balance training, Gait training, Patient/Family education, Self Care, Joint mobilization, Stair training, Vestibular training, Canalith repositioning, DME instructions, Aquatic Therapy, Dry Needling, Spinal mobilization, Cryotherapy,  Moist heat, Manual therapy, and Re-evaluation  PLAN FOR NEXT SESSION:   Allow pt identifies as most pressing issue (shoulder vs neck, vs LE) and treat with strengthening or stretching program to assist with ROM and  pain modulation.    Nolon Bussing, PT, DPT Physical Therapist - [facility]  09/01/22, 5:22 PM

## 2022-09-02 ENCOUNTER — Other Ambulatory Visit: Payer: Self-pay

## 2022-09-02 ENCOUNTER — Other Ambulatory Visit: Payer: Self-pay | Admitting: Hematology and Oncology

## 2022-09-02 ENCOUNTER — Telehealth: Payer: Self-pay | Admitting: *Deleted

## 2022-09-02 DIAGNOSIS — C50512 Malignant neoplasm of lower-outer quadrant of left female breast: Secondary | ICD-10-CM

## 2022-09-02 MED ORDER — PALBOCICLIB 75 MG PO TABS
75.0000 mg | ORAL_TABLET | Freq: Every day | ORAL | 0 refills | Status: DC
Start: 2022-09-02 — End: 2022-11-10
  Filled 2022-09-02 (×2): qty 21, 21d supply, fill #0

## 2022-09-02 NOTE — Telephone Encounter (Signed)
Received call from pt with complaint of increase in acid reflex and requesting advice if symptoms are related to [place].  Per MD symptoms are not related and pt needing to f/u with PCP.  Pt educated and verbalized understanding.

## 2022-09-03 ENCOUNTER — Ambulatory Visit: Payer: Medicaid Other

## 2022-09-03 DIAGNOSIS — M6281 Muscle weakness (generalized): Secondary | ICD-10-CM

## 2022-09-03 DIAGNOSIS — M25611 Stiffness of right shoulder, not elsewhere classified: Secondary | ICD-10-CM

## 2022-09-03 DIAGNOSIS — R262 Difficulty in walking, not elsewhere classified: Secondary | ICD-10-CM

## 2022-09-03 DIAGNOSIS — M542 Cervicalgia: Secondary | ICD-10-CM

## 2022-09-03 DIAGNOSIS — M25612 Stiffness of left shoulder, not elsewhere classified: Secondary | ICD-10-CM

## 2022-09-03 DIAGNOSIS — M436 Torticollis: Secondary | ICD-10-CM

## 2022-09-03 NOTE — Therapy (Signed)
OUTPATIENT PHYSICAL THERAPY NEURO TREATMENT     Patient Name: Kathryn Lucas MRN: [id number] DOB:Mar 30, 1960, 62 y.o., female Today's Date: 09/03/2022   PCP: Ardean Larsen, MD REFERRING PROVIDER: [name], MD  END OF SESSION:  PT End of Session - 09/03/22 1551     Visit Number 25    Number of Visits 40    Date for PT Re-Evaluation 10/19/22    Authorization Type [employer]    Authorization Time Period 07/27/22-10/19/22    Progress Note Due on Visit 30    PT Start Time 1515    PT Stop Time 1555    PT Time Calculation (min) 40 min    Activity Tolerance Patient limited by pain;Patient tolerated treatment well    Behavior During Therapy [facility] for tasks assessed/performed              Past Medical History:  Diagnosis Date   Breast cancer (HCC)    left breast cancer   Cancer (HCC) 09/2017   left breast cancer   Family history of breast cancer    Hypothyroidism    Personal history of radiation therapy 2019   Thyroid disease    Past Surgical History:  Procedure Laterality Date   BREAST BIOPSY Left 2018   BREAST BIOPSY Left 2019   BREAST RECONSTRUCTION WITH PLACEMENT OF TISSUE EXPANDER AND ALLODERM Left 10/26/2017   Procedure: LEFT BREAST RECONSTRUCTION WITH PLACEMENT OF TISSUE EXPANDER AND ALLODERM;  Surgeon: Glenna Fellows, MD;  Location: [facility];  Service: Plastics;  Laterality: Left;   MASTECTOMY Left 2019   MASTECTOMY WITH RADIOACTIVE SEED GUIDED EXCISION AND AXILLARY SENTINEL LYMPH NODE BIOPSY Left 10/26/2017   Procedure: LEFT MASTECTOMY WITH SEED TARGETED  LEFT AXILLARY LYMPH NODE EXCISION AND LEFT SENTINEL LYMPH NODE BIOPSY;  Surgeon: Almond Lint, MD;  Location: [facility];  Service: General;  Laterality: Left;   TONSILLECTOMY     WISDOM TOOTH EXTRACTION     Patient Active Problem List   Diagnosis Date Noted   Metastatic malignant neoplasm (HCC) 12/19/2021   Family history of breast cancer    History of therapeutic  radiation 04/27/2018   Breast cancer, left breast (HCC) 10/26/2017   Malignant neoplasm of lower-outer quadrant of left breast of female, estrogen receptor positive (HCC) 07/15/2016   Plantar fasciitis 07/30/2015   Metatarsalgia 10/18/2014    ONSET DATE: September 2023  REFERRING DIAG: C79.51 (ICD-10-CM) - Malignant neoplasm metastatic to bone (HCC)   THERAPY DIAG:  Difficulty in walking, not elsewhere classified  Muscle weakness (generalized)  Decreased range of motion of right shoulder  Stiffness of cervical spine  Decreased range of motion of left shoulder  Painful cervical ROM  Rationale for Evaluation and Treatment: Rehabilitation  SUBJECTIVE:  SUBJECTIVE STATEMENT: Pt reports doing fine today, still feels big improvements since starting to work with PT.  Pt accompanied by: self, aide   PERTINENT HISTORY:   Pt is 62 y.o. female that is seeking therapy for weakness of the R LE.  Pt tripped over the threshold of her door back in September of 2023, and had a transverse fracture of the L patella when she fell on it.  She was recently diagnosed with bone cancer back in October of 2023.  Pt notes that she has more stiffness in the neck and pelvis region because the cancer is located in those locations.  Pt is currently utilizing a hinged knee brace that she received from a friend for support of the L knee.    PAIN:  Are you having pain? No  PRECAUTIONS: Other: Cancer  Secure Chat with referring provider [name], MD, and asked for clarification on any precautions were present due to the pt's cancer diagnosis  Pt is cleared to have modalities performed including dry needling and heat applied per MD.  WEIGHT BEARING RESTRICTIONS: Pt denies having any weightbearing restrictions on the  knee  FALLS: Has patient fallen in last 6 months? Yes. Number of falls 1  LIVING ENVIRONMENT: Lives with: lives alone Lives in: House/apartment Stairs: No Has following equipment at home: Dan Humphreys - 4 wheeled, shower chair, and elevated toilet with bars on the side.  Pt is looking at putting in grab bars in the shower, but has assistant that helps with showers.  PLOF: Independent  PATIENT GOALS: To get stronger, be able to walk better.    OBJECTIVE:   TODAY'S TREATMENT: DATE: 09/03/22   Reassessment of goal measures:  -FOTO survey, 5xSTS, TUG, Cervical and Shoulder ROM   Supine on mat, knees supported, pillow at head, moist heat at neck:  -wand flexion x20 -cervical retraction isometrics x15 -cervical rotation A/ROM 1x20, alternating sides -snow angle arm slides (ABDCT A/ROM) x15 -shoulder flexion A/ROM without wand x20 (hands to crown of head)  -cervical retraction x15      PATIENT EDUCATION: Education details: Pt educated throughout session about proper posture and technique with exercises. Improved exercise technique, movement at target joints, use of target muscles after min to mod verbal, visual, tactile cues. Person educated: Patient Education method: Explanation Education comprehension: verbalized understanding  HOME EXERCISE PROGRAM:  Access Code: [email or web address] URL: [email or web address] Date: 05/19/2022 Prepared by: Kathryn Lucas  Exercises - Seated Hip Abduction with Resistance  - 1 x daily - 7 x weekly - 3 sets - 10 reps - Standing Hip Extension Kicks  - 1 x daily - 7 x weekly - 3 sets - 10 reps - Standing Hip Abduction Kicks  - 1 x daily - 7 x weekly - 3 sets - 10 reps  Access Code: [email or web address] URL: [email or web address] Date: 05/14/2022 Prepared by: Kathryn Lucas  Exercises - Isometric Shoulder Flexion at Wall  - 1 x daily - 7 x weekly - 3 sets - 10 reps - Isometric Shoulder Extension at Wall  - 1 x daily - 7 x weekly - 3 sets -  10 reps - Isometric Shoulder Abduction at Wall  - 1 x daily - 7 x weekly - 3 sets - 10 reps - Isometric Shoulder Adduction  - 1 x daily - 7 x weekly - 3 sets - 10 reps   GOALS:  Goals reviewed with patient? Yes  SHORT TERM GOALS: Target date: 06/03/2022  Pt will be independent with HEP in order to  demonstrate increased ability to perform tasks related to occupation/hobbies. Baseline: Not given HEP at initial evaluation. Goal status: MET   LONG TERM GOALS: Target date: 10/19/22  Patient will demonstrate improved function as evidenced by a score of 59 on FOTO measure for full participation in activities at home and in the community. Baseline: Evaluation: 46, 4/17: 68. 5/13: 58; 6/20: 75 Goal status: Achieved   2.  Patient to demonstrate increased cervical rotation ROM to be 65 deg in order to return to PLOF and improving safety while driving. Baseline: 16/26 deg 4/17 28/39 DEG; 5/13 28/29; 09/03/22: Left 12, Rt 32 Goal status: IN PROGRESS  3.  Pt to improve B shoulder ROM to be WFL (Flexion: 120 deg; Abduction: 130 deg) in order to improve ability to complete tasks Baseline: R/L Shoulder Flex: 62/99; R/L Shoulder Scaption: 60/56 4/17: shoulder flexion 76/105 shoulder scaption 76/91 5/13 shoulder flexion 78/92 shoulder scaption 85/108.  6/20: A/ROM: Rt flexion 60 degrees, Left flexion 92; P/ROM: Rt flexion: 89 degrees, Lt flexion: 120 degrees  Goal status: IN PROGRESS  4.  Pt to improve overall strength of the LE's in order to ambulate for longer distances and be able to tolerate surgery to repair the L knee. Baseline: Global 4/5 strength in the R LE, L LE MMT deferred due to the patella being broken at this time. 4/17 improved strength on the RLE and LLE with knee extension 4-/5, hip flexion and knee flexion 4/5 5/13: BLE 4 to 4+/5 except R hip flexion 4-/5  : 6/20: deferred Goal status: IN PROGRESS   5.  Patient (> 15 years old) will complete five times sit to stand test in < 15  seconds indicating an increased LE strength and improved balance. Baseline: 23.43 sec 4/17: 16.53sec from slightly elevatedd mat table  5/13: 13.72 sec with 1 inch pad in seat.  5/29: 13.9sec with 1 inch pad. 16.50 in standard seat; 09/03/22: 12.17sec with hands on chair surface Goal status: in progress.    6.  Patient will reduce timed up and go to <11 seconds to reduce fall risk and demonstrate improved transfer/gait ability. Baseline: 23.33 sec 4/17: 13.61 average of 2 trials. 5/13 9.86 sec; 09/03/22: 8.96sec, no device  Goal status: MET  7.  Pt will improve BERG by at least 3 points in order to demonstrate clinically significant improvement in balance.  Baseline: 43 4/17: 48/56; 07/27/2022 49/56; 6/20 deferred  Goal status: MET      ASSESSMENT:  CLINICAL IMPRESSION:  Took time to reassessment objective tests and measures and reflect on progress toward goals of treatment. FOTO score improvements noted. Pt still feels weak with leg strength in transfers. ROM in shoulders apparently improved to less than significant degrees, but cervical ROM does not appear improved at to any degree, moreover remains far off from goals. Utilized high frequency joint mobility in session to maximize joint nutrition and pain, however did not engage in any LLLD mobility work this date. Pt will continue to benefit from skilled therapy to address remaining deficits in order to improve overall QoL and return to PLOF.       OBJECTIVE IMPAIRMENTS: Abnormal gait, decreased activity tolerance, decreased balance, decreased endurance, decreased knowledge of use of DME, decreased mobility, difficulty walking, decreased ROM, decreased strength, hypomobility, impaired flexibility, impaired UE functional use, and pain  ACTIVITY LIMITATIONS: carrying, lifting, bending, sitting, standing, squatting, stairs, transfers, bed mobility, bathing, toileting, dressing, reach over head, hygiene/grooming, and locomotion  level  PARTICIPATION LIMITATIONS: meal prep, cleaning,  laundry, driving, shopping, community activity, occupation, and yard work  PERSONAL FACTORS: Time since onset of injury/illness/exacerbation and 3+ comorbidities: Hx of breast cancer, bone cancer, thyroid disease  are also affecting patient's functional outcome.   REHAB POTENTIAL: Fair due to multiple joints needing to be targeted and pt's current bone cancer that is causing her joints to stiffen and become more rigid.    CLINICAL DECISION MAKING: Evolving/moderate complexity  EVALUATION COMPLEXITY: Moderate  PLAN:  PT FREQUENCY: 2x/week  PT DURATION: 12 weeks  PLANNED INTERVENTIONS: Therapeutic exercises, Therapeutic activity, Neuromuscular re-education, Balance training, Gait training, Patient/Family education, Self Care, Joint mobilization, Stair training, Vestibular training, Canalith repositioning, DME instructions, Aquatic Therapy, Dry Needling, Spinal mobilization, Cryotherapy, Moist heat, Manual therapy, and Re-evaluation  PLAN FOR NEXT SESSION:   Allow pt identifies as most pressing issue (shoulder vs neck, vs LE) and treat with strengthening or stretching program to assist with ROM and pain modulation.   3:54 PM, 09/03/22 Rosamaria Lints, PT, DPT Physical Therapist - [facility]  Outpatient Physical Therapy- [facility] [phone]     09/03/22, 3:54 PM

## 2022-09-07 ENCOUNTER — Ambulatory Visit: Payer: Medicaid Other | Admitting: Physical Therapy

## 2022-09-07 ENCOUNTER — Telehealth: Payer: Self-pay | Admitting: *Deleted

## 2022-09-07 ENCOUNTER — Other Ambulatory Visit (HOSPITAL_COMMUNITY): Payer: Self-pay

## 2022-09-07 ENCOUNTER — Telehealth: Payer: Self-pay | Admitting: Hematology and Oncology

## 2022-09-07 NOTE — Telephone Encounter (Signed)
Scheduled appointments per staff message. Left voicemail. 

## 2022-09-07 NOTE — Telephone Encounter (Signed)
Scheduled appointment per staff message. Left voicemail. 

## 2022-09-07 NOTE — Telephone Encounter (Signed)
Received call from pt sister Coy Saunas stating pt has been admitted to [facility] in [place] with confusion and pneumonia.  Pt sister is very upset with current status of pt and requesting to speak with MD.  RN will allert MD of sisters request.

## 2022-09-08 ENCOUNTER — Encounter: Payer: Self-pay | Admitting: Hematology and Oncology

## 2022-09-08 ENCOUNTER — Other Ambulatory Visit (HOSPITAL_COMMUNITY): Payer: Self-pay

## 2022-09-09 ENCOUNTER — Telehealth: Payer: Medicaid Other | Admitting: Hematology and Oncology

## 2022-09-09 ENCOUNTER — Ambulatory Visit: Payer: Medicaid Other | Admitting: Physical Therapy

## 2022-09-11 ENCOUNTER — Institutional Professional Consult (permissible substitution): Payer: Medicaid Other | Admitting: Radiation Oncology

## 2022-09-11 ENCOUNTER — Other Ambulatory Visit: Payer: Self-pay | Admitting: Radiation Therapy

## 2022-09-11 ENCOUNTER — Other Ambulatory Visit: Payer: Self-pay

## 2022-09-11 ENCOUNTER — Encounter: Payer: Self-pay | Admitting: Hematology and Oncology

## 2022-09-11 ENCOUNTER — Inpatient Hospital Stay
Admission: RE | Admit: 2022-09-11 | Discharge: 2022-09-11 | Disposition: A | Discharge status: Home / Self Care | Payer: Self-pay | Source: Ambulatory Visit | Attending: Radiation Oncology | Admitting: Radiation Oncology

## 2022-09-11 ENCOUNTER — Ambulatory Visit: Payer: Medicaid Other

## 2022-09-11 ENCOUNTER — Telehealth: Payer: Self-pay

## 2022-09-11 DIAGNOSIS — C7931 Secondary malignant neoplasm of brain: Secondary | ICD-10-CM

## 2022-09-11 DIAGNOSIS — C7951 Secondary malignant neoplasm of bone: Secondary | ICD-10-CM

## 2022-09-11 DIAGNOSIS — C799 Secondary malignant neoplasm of unspecified site: Secondary | ICD-10-CM

## 2022-09-11 DIAGNOSIS — C50912 Malignant neoplasm of unspecified site of left female breast: Secondary | ICD-10-CM

## 2022-09-11 DIAGNOSIS — C50512 Malignant neoplasm of lower-outer quadrant of left female breast: Secondary | ICD-10-CM

## 2022-09-11 NOTE — Telephone Encounter (Signed)
Pt called and asked why she would need radiation appt for her head. Advised pt MD with f/u with her next week to explain scans and recommendations. She verbalized thanks and understanding.

## 2022-09-14 ENCOUNTER — Ambulatory Visit: Payer: Commercial Managed Care - PPO | Admitting: Pharmacist

## 2022-09-14 ENCOUNTER — Ambulatory Visit: Payer: Medicaid Other | Admitting: Radiation Oncology

## 2022-09-14 ENCOUNTER — Ambulatory Visit: Payer: Commercial Managed Care - PPO

## 2022-09-14 ENCOUNTER — Encounter: Payer: Commercial Managed Care - PPO | Admitting: Physical Therapy

## 2022-09-14 ENCOUNTER — Ambulatory Visit
Admission: RE | Admit: 2022-09-14 | Discharge: 2022-09-14 | Disposition: A | Discharge status: Home / Self Care | Payer: Medicaid Other | Source: Ambulatory Visit | Attending: Radiation Oncology | Admitting: Radiation Oncology

## 2022-09-14 ENCOUNTER — Inpatient Hospital Stay (HOSPITAL_BASED_OUTPATIENT_CLINIC_OR_DEPARTMENT_OTHER): Payer: Medicaid Other | Admitting: Hematology and Oncology

## 2022-09-14 ENCOUNTER — Inpatient Hospital Stay: Payer: Medicaid Other

## 2022-09-14 ENCOUNTER — Telehealth: Payer: Self-pay | Admitting: Hematology and Oncology

## 2022-09-14 ENCOUNTER — Ambulatory Visit: Payer: Medicaid Other | Admitting: Hematology and Oncology

## 2022-09-14 ENCOUNTER — Other Ambulatory Visit: Payer: Medicaid Other

## 2022-09-14 ENCOUNTER — Encounter: Payer: Self-pay | Admitting: Radiation Oncology

## 2022-09-14 ENCOUNTER — Ambulatory Visit (HOSPITAL_COMMUNITY): Payer: Medicaid Other

## 2022-09-14 VITALS — BP 145/96 | HR 111 | Temp 97.6°F | Resp 18 | Ht 71.0 in | Wt 144.0 lb

## 2022-09-14 VITALS — BP 145/96 | HR 111 | Temp 97.6°F | Resp 18 | Ht 71.0 in | Wt 144.8 lb

## 2022-09-14 DIAGNOSIS — Z9012 Acquired absence of left breast and nipple: Secondary | ICD-10-CM | POA: Insufficient documentation

## 2022-09-14 DIAGNOSIS — E860 Dehydration: Secondary | ICD-10-CM | POA: Insufficient documentation

## 2022-09-14 DIAGNOSIS — Z803 Family history of malignant neoplasm of breast: Secondary | ICD-10-CM | POA: Diagnosis not present

## 2022-09-14 DIAGNOSIS — Z79899 Other long term (current) drug therapy: Secondary | ICD-10-CM | POA: Insufficient documentation

## 2022-09-14 DIAGNOSIS — C7951 Secondary malignant neoplasm of bone: Secondary | ICD-10-CM | POA: Insufficient documentation

## 2022-09-14 DIAGNOSIS — Z923 Personal history of irradiation: Secondary | ICD-10-CM | POA: Insufficient documentation

## 2022-09-14 DIAGNOSIS — C7931 Secondary malignant neoplasm of brain: Secondary | ICD-10-CM | POA: Diagnosis not present

## 2022-09-14 DIAGNOSIS — C50912 Malignant neoplasm of unspecified site of left female breast: Secondary | ICD-10-CM

## 2022-09-14 DIAGNOSIS — C50512 Malignant neoplasm of lower-outer quadrant of left female breast: Secondary | ICD-10-CM | POA: Insufficient documentation

## 2022-09-14 DIAGNOSIS — Z7952 Long term (current) use of systemic steroids: Secondary | ICD-10-CM | POA: Diagnosis not present

## 2022-09-14 DIAGNOSIS — Z170 Estrogen receptor positive status [ER+]: Secondary | ICD-10-CM | POA: Insufficient documentation

## 2022-09-14 DIAGNOSIS — Z79811 Long term (current) use of aromatase inhibitors: Secondary | ICD-10-CM | POA: Insufficient documentation

## 2022-09-14 DIAGNOSIS — E039 Hypothyroidism, unspecified: Secondary | ICD-10-CM | POA: Insufficient documentation

## 2022-09-14 DIAGNOSIS — Z79890 Hormone replacement therapy (postmenopausal): Secondary | ICD-10-CM | POA: Diagnosis not present

## 2022-09-14 DIAGNOSIS — Z5111 Encounter for antineoplastic chemotherapy: Secondary | ICD-10-CM | POA: Insufficient documentation

## 2022-09-14 LAB — CMP (CANCER CENTER ONLY)
ALT: 30 U/L (ref 0–44)
AST: 309 U/L (ref 15–41)
Albumin: 3.7 g/dL (ref 3.5–5.0)
Alkaline Phosphatase: 77 U/L (ref 38–126)
Anion gap: 10 (ref 5–15)
BUN: 25 mg/dL — ABNORMAL HIGH (ref 8–23)
CO2: 22 mmol/L (ref 22–32)
Calcium: 9 mg/dL (ref 8.9–10.3)
Chloride: 100 mmol/L (ref 98–111)
Creatinine: 0.64 mg/dL (ref 0.44–1.00)
GFR, Estimated: >60 mL/min (ref >60)
Glucose, Bld: 101 mg/dL — ABNORMAL HIGH (ref 70–99)
Potassium: 4 mmol/L (ref 3.5–5.1)
Sodium: 132 mmol/L — ABNORMAL LOW (ref 135–145)
Total Bilirubin: 0.4 mg/dL (ref 0.3–1.2)
Total Protein: 7.5 g/dL (ref 6.5–8.1)

## 2022-09-14 LAB — CBC WITH DIFFERENTIAL (CANCER CENTER ONLY)
Abs Immature Granulocytes: 0.14 10*3/uL — ABNORMAL HIGH (ref 0.00–0.07)
Basophils Absolute: 0 10*3/uL (ref 0.0–0.1)
Basophils Relative: 0 %
Eosinophils Absolute: 0 10*3/uL (ref 0.0–0.5)
Eosinophils Relative: 0 %
HCT: 40.4 % (ref 36.0–46.0)
Hemoglobin: 14.4 g/dL (ref 12.0–15.0)
Immature Granulocytes: 2 %
Lymphocytes Relative: 7 %
Lymphs Abs: 0.5 10*3/uL — ABNORMAL LOW (ref 0.7–4.0)
MCH: 35.6 pg — ABNORMAL HIGH (ref 26.0–34.0)
MCHC: 35.6 g/dL (ref 30.0–36.0)
MCV: 100 fL (ref 80.0–100.0)
Monocytes Absolute: 1.4 10*3/uL — ABNORMAL HIGH (ref 0.1–1.0)
Monocytes Relative: 22 %
Neutro Abs: 4.6 10*3/uL (ref 1.7–7.7)
Neutrophils Relative %: 69 %
Platelet Count: 177 10*3/uL (ref 150–400)
RBC: 4.04 MIL/uL (ref 3.87–5.11)
RDW: 15.5 % (ref 11.5–15.5)
WBC Count: 6.7 10*3/uL (ref 4.0–10.5)
nRBC: 0.3 % — ABNORMAL HIGH (ref 0.0–0.2)

## 2022-09-14 NOTE — Progress Notes (Addendum)
Location/Histology of Brain Tumor: Metastatic disease in the dura, skull, C spine and T spine.   06/18/2022 Dr. [name] CT Chest Abdomen Pelvis with Contrast CLINICAL DATA: Metastatic breast cancer. * Tracking Code: BO *   IMPRESSION: Overall appearance is similar to the previous examination. Widespread mixed lucent and sclerotic bone metastases with several areas of bony deformity/destruction and pathologic injury including ribs, spine and pelvis. By CT the distribution is similar to previous.  Stable enhancing soft tissue nodule in the uncinate process of the pancreas.  Stable small areas of adrenal nodularity as well as small low-attenuation renal lesions.  Stable hepatic cystic foci.  Colonic diverticulosis.  Small right axillary lymph node is slightly decreased in size today.   03/20/2022 [name] CT Chest Abdomen Pelvis with Contrast CLINICAL DATA:  62 year old female with history of metastatic brain cancer. Follow-up study. * Tracking Code: BO *  IMPRESSION: 1. Widespread skeletal metastatic disease, similar to the prior examination, as above. 2. Hypervascular mass in the inferior aspect of the pancreatic head estimated to measure 2.5 x 1.7 cm corresponding to hypermetabolism on prior PET-CT. Whether this represents a metastatic lesion or primary pancreatic lesion is uncertain. This could be further evaluated with follow-up abdominal MRI with and without IV gadolinium with MRCP if clinically appropriate. 3. There are also apparent hypovascular lesions in both kidneys. The possibility of renal metastases should be considered, and could also be further evaluated with abdominal MRI with and without IV gadolinium if clinically appropriate. Small adrenal nodules bilaterally (left-greater-than-right) are also noted, corresponding to hypermetabolism on prior PET-CT, indicative of metastatic lesions. 4. Subcentimeter hypovascular lesion in segment 4A of the liver, too small to  characterize. No hypermetabolism was noted in this region on prior PET-CT. This is favored to be benign, and is similar in retrospect compared to prior PET-CT, but could also be evaluated at time of abdominal MRI with and without IV gadolinium if clinically appropriate. 5. 8 mm right axillary lymph node, stable compared to the prior study (previously hypermetabolic on PET-CT). 6. Colonic diverticulosis without evidence of acute diverticulitis at this time. 7. Additional incidental findings, as above.   Past or anticipated interventions, if any, per neurosurgery: NA  Past or anticipated interventions, if any, per medical oncology:   08/13/2022 Dr. Pamelia Hoit    Dose of Decadron, if applicable: Yes, 4 mg q6h for 6 days.  Recent neurologic symptoms, if any:  Seizures: No Headaches: No Nausea: No Dizziness/ataxia: No Difficulty with hand coordination: No Focal numbness/weakness: No Visual deficits/changes: No Confusion/Memory deficits: Yes, was recently seen in hospital for increase confusion.  Painful bone metastases at present, if any: No  SAFETY ISSUES: Prior radiation? Yes, Left Breast 12/08/2017-01/26/2018. Pacemaker/ICD? No Possible current pregnancy? Postmenopausal Is the patient on methotrexate? No  Additional Complaints / other details: No

## 2022-09-14 NOTE — Progress Notes (Signed)
CRITICAL VALUE STICKER  CRITICAL VALUE: AST [id number]  RECEIVER (on-site recipient of call): Grier Rocher, RN  DATE & TIME NOTIFIED: 09/14/22 1541  MD NOTIFIED: [name]  TIME OF NOTIFICATION: 1542  RESPONSE:  No new orders at this time

## 2022-09-14 NOTE — Assessment & Plan Note (Signed)
06/19/2016 Left breast biopsy 3:30: IDC with DCIS grade 1, ER 90%, PR 50%, Ki-67 15%, HER-2 negative ratio 1.13; biopsy 5:30 position: IDC grade 1   10/26/17: Left mastectomy: IDC grade 1, 2 foci largest spans 8.5 cm, intermediate grade DCIS, lymphovascular invasion identified, perineural invasion identified, 1/2 lymph nodes positive with extracapsular extension, ER 9200%, PR 5 to 50%, HER-2 negative, Ki-67 10 to 15%, T3N1A   Oncotype DX score 22, intermediate risk, chemotherapy not felt to have significant benefit.   Treatment Summary: 1. Antiestrogen therapy with anastrozole 1 mg daily started 07/15/2016 2. Mastectomy 10/26/2017, Mammaprint low risk luminal type A 3. Followed by adjuvant radiation 12/08/17- 01/26/18  4. Followed by adjuvant antiestrogen therapy anastrozole started 01/17/2018 (originally started 07/15/2016) -------------------------------------------------------------------- Low back pain August 2023: Underwent CT myelogram: Large expansile lesion in the sacrum with extraosseous extension of the tumor, diffuse lytic lesions throughout the visualized spine with metastatic disease myeloma is considered less likely.  (This was ordered by Dr. Marcene Corning)   Treatment plan: 1.  PET CT scan 12/27/2021: Widespread bone metastatic disease largest lesion involving the sacrum with pathological fractures of T6 and T10 retroperitoneal and pelvic lymph node metastasis, right axillary lymph node, activity in the pancreatic head, hypermetabolic activity in the adrenal glands, right thyroid nodule. 2. biopsy of sacrum: 12/26/2021: Metastatic breast cancer, ER 90%, PR 10%, HER2 negative (0) 3.  Treatment plan: Ibrance along with Faslodex started 12/25/2021 4.  Xgeva for bone metastases.  Every 3 months 5.  Palliative radiation to the sacrum completed 01/19/2022 Genetic testing for BRCA  analysis. -------------------------------------------------------------------------------------------------------------------------------------- Current treatment: Ibrance with Faslodex and Zometa, started 12/25/2021 Toxicities: Leukopenia: Currently on Ibrance 75 mg.   Fatigue Alternating constipation and diarrhea Severe bone pain: Patient is not taking any pain medications currently because the pain is now under very good control without it.   CT CAP: 06/18/2022: Overall appearance similar to previous exam.  Makes it lucent and sclerotic bone metastases similar.  Stable soft tissue nodule pancreas, stable adrenal nodules, small right axillary lymph node slightly decreased   Skin punch biopsy 07/23/2022: Breast cancer ER 95%, PR 0%, HER2 2+ by IHC, FISH negative ratio 1.1 (with HER2 being 2+ positive she would be eligible for Enhertu) Knee swelling: Saw orthopedics Hospitalization at [facility]: Dehydration, CT head showing sinus bone lesions resulting in growth in the brain.  Dr. Kathrynn Running with radiation oncology felt that the lesions were not significant enough to require palliative radiation.  Return to clinic monthly for injections and follow-ups.

## 2022-09-14 NOTE — Progress Notes (Signed)
Patient Care Team: Ollen Bowl, MD as PCP - General (Internal Medicine) [name], MD as Consulting Physician (Hematology and Oncology) Almond Lint, MD as Consulting Physician (General Surgery) Dorothy Puffer, MD as Consulting Physician (Radiation Oncology)  DIAGNOSIS:  Encounter Diagnosis  Name Primary?   Malignant neoplasm of lower-outer quadrant of left breast of female, estrogen receptor positive (HCC) Yes    SUMMARY OF ONCOLOGIC HISTORY: Oncology History  Malignant neoplasm of lower-outer quadrant of left breast of female, estrogen receptor positive (HCC)  06/11/2016 Mammogram   Palpable left breast masses 3:00 position: 2.2 cm; 5:30 position: 2.5 cm; 6:30 position: 0.7 cm   06/19/2016 Initial Diagnosis   Left breast biopsy 3:30: IDC with DCIS grade 1, ER 90%, PR 50%, Ki-67 15%, HER-2 negative ratio 1.13; biopsy 5:30 position: IDC grade 1   07/13/2016 Breast MRI   Large area of abnormal enhancement lower inner and lower outer quadrants left breast spanning 9 cm x 6.4 cm x 5.3 cm, no abnormal enlarged lymph nodes; T3 N0 stage II a (New AJCC staging)    07/15/2016 - 12/08/2017 Anti-estrogen oral therapy   Neoadjuvant anastrozole 1 mg daily   07/17/2016 Oncotype testing   Testing done on the biopsy: Oncotype DX score 22, intermediate risk   02/02/2017 Breast MRI   Left breast multicentric disease unchanged measuring 2.7 x 1.6 cm.  Mass in the non-mass enhancement are also not significantly changed measuring 6.2 x 2.4 cm. new enhancing mass within the outer right breast 7 mm which could be fat necrosis or inclusion cyst    02/09/2017 Imaging   Ultrasound of the right breast lesion noted on MRI: No sonographic finding corresponds to the abnormality noted on MRI   07/13/2017 Cancer Staging   Staging form: Breast, AJCC 8th Edition - Clinical stage from 07/13/2017: Stage IIA (cT3, cN0, cM0, G1, ER+, PR+, HER2-) - Signed by Loa Socks, NP on 05/18/2018   10/26/2017  Surgery   Left mastectomy: IDC grade 1, 2 foci largest spans 8.5 cm, intermediate grade DCIS, lymphovascular invasion identified, perineural invasion identified, 1/2 lymph nodes positive with extracapsular extension, ER 9200%, PR 5 to 50%, HER-2 negative, Ki-67 10 to 15%, T3N1A Mammaprint: low risk   11/02/2017 Cancer Staging   Staging form: Breast, AJCC 8th Edition - Pathologic: No Stage Recommended (ypT3, pN1a, cM0, G1, ER+, PR+, HER2-) - Signed by [name], MD on 11/02/2017   12/08/2017 - 01/26/2018 Radiation Therapy   Adjuvant radiation therapy    02/2018 -  Anti-estrogen oral therapy   Anastrozole 1 mg daily adjuvant therapy     CHIEF COMPLIANT: Hospital f/u  INTERVAL HISTORY: Kathryn Lucas is a 62 y.o. with above-mentioned history of metastatic breast cancer who is currently on Faslodex with Ibrance. She presents to the clinic for a follow-up.  She was recently hospitalized near-term at [facility] with loss of consciousness.  This was after a full day of staying active and busy and was dehydrated.  She was seen by oncology and Dr. Nyra Capes called and discussed her hospitalization with me.  We determined that she surgery.  She was referred to [place] for consideration for radiation.  She met with Dr. Kathrynn Running who did not think she would benefit from radiation.  ALLERGIES:  is allergic to percocet [oxycodone-acetaminophen].  MEDICATIONS:  Current Outpatient Medications  Medication Sig Dispense Refill   anastrozole (ARIMIDEX) 1 MG tablet Take 1 tablet (1 mg total) by mouth daily. 90 tablet 3   azelastine (ASTELIN)  0.1 % nasal spray Place into both nostrils 2 (two) times daily.     Calcium 500-100 MG-UNIT CHEW Chew 1 tablet by mouth daily. 60 tablet    cetirizine (ZYRTEC) 10 MG tablet Take by mouth.     cholecalciferol (VITAMIN D3) 25 MCG (1000 UNIT) tablet Take 1 tablet (1,000 Units total) by mouth daily.     dexamethasone (DECADRON) 4 MG tablet Take by mouth.     famotidine  (PEPCID) 20 MG tablet Take 20 mg by mouth 2 (two) times daily.     fluticasone (FLONASE) 50 MCG/ACT nasal spray Place into the nose.     gabapentin (NEURONTIN) 300 MG capsule Take 300 mg by mouth 3 (three) times daily.     levothyroxine (SYNTHROID) 112 MCG tablet Take 1 tablet by mouth daily.     levothyroxine (SYNTHROID, LEVOTHROID) 175 MCG tablet Take 112 mcg by mouth.      LORazepam (ATIVAN) 0.5 MG tablet Take 1 tablet (0.5 mg total) by mouth at bedtime. 30 tablet 0   omeprazole (PRILOSEC) 40 MG capsule Take by mouth.     ondansetron (ZOFRAN) 8 MG tablet Take 1 tablet (8 mg total) by mouth every 8 (eight) hours as needed for nausea. 30 tablet 3   palbociclib (IBRANCE) 75 MG tablet Take 1 tablet (75 mg total) by mouth daily. Take for 21 days. (As directed by MD) 21 tablet 0   pantoprazole (PROTONIX) 40 MG tablet Take by mouth.     vitamin C (ASCORBIC ACID) 250 MG tablet Take 1 tablet (250 mg total) by mouth daily.     zinc gluconate 50 MG tablet Take 1 tablet (50 mg total) by mouth daily.     No current facility-administered medications for this visit.    PHYSICAL EXAMINATION: ECOG PERFORMANCE STATUS: 2 - Symptomatic, <50% confined to bed  Vitals:   09/14/22 1511  BP: (!) 145/96  Pulse: (!) 111  Resp: 18  Temp: 97.6 F (36.4 C)  SpO2: 98%   Filed Weights   09/14/22 1511  Weight: 144 lb (65.3 kg)      LABORATORY DATA:  I have reviewed the data as listed    Latest Ref Rng & Units 06/22/2022    1:56 PM 05/18/2022    1:27 PM 04/20/2022    1:44 PM  CMP  Glucose 70 - 99 mg/dL 161  82  83   BUN 8 - 23 mg/dL 13  20  15    Creatinine 0.44 - 1.00 mg/dL 0.96  0.45  4.09   Sodium 135 - 145 mmol/L 136  136  135   Potassium 3.5 - 5.1 mmol/L 3.9  4.2  4.3   Chloride 98 - 111 mmol/L 103  106  105   CO2 22 - 32 mmol/L 26  25  25    Calcium 8.9 - 10.3 mg/dL 9.8  9.1  9.4   Total Protein 6.5 - 8.1 g/dL 6.8  6.5  6.3   Total Bilirubin 0.3 - 1.2 mg/dL 0.6  0.5  0.6   Alkaline Phos 38 - 126  U/L 63  68  74   AST 15 - 41 U/L 194  89  94   ALT 0 - 44 U/L 23  21  41     Lab Results  Component Value Date   WBC 6.7 09/14/2022   HGB 14.4 09/14/2022   HCT 40.4 09/14/2022   MCV 100.0 09/14/2022   PLT 177 09/14/2022   NEUTROABS 4.6 09/14/2022  ASSESSMENT & PLAN:  Malignant neoplasm of lower-outer quadrant of left breast of female, estrogen receptor positive (HCC) 06/19/2016 Left breast biopsy 3:30: IDC with DCIS grade 1, ER 90%, PR 50%, Ki-67 15%, HER-2 negative ratio 1.13; biopsy 5:30 position: IDC grade 1   10/26/17: Left mastectomy: IDC grade 1, 2 foci largest spans 8.5 cm, intermediate grade DCIS, lymphovascular invasion identified, perineural invasion identified, 1/2 lymph nodes positive with extracapsular extension, ER 9200%, PR 5 to 50%, HER-2 negative, Ki-67 10 to 15%, T3N1A   Oncotype DX score 22, intermediate risk, chemotherapy not felt to have significant benefit.   Treatment Summary: 1. Antiestrogen therapy with anastrozole 1 mg daily started 07/15/2016 2. Mastectomy 10/26/2017, Mammaprint low risk luminal type A 3. Followed by adjuvant radiation 12/08/17- 01/26/18  4. Followed by adjuvant antiestrogen therapy anastrozole started 01/17/2018 (originally started 07/15/2016) -------------------------------------------------------------------- Low back pain August 2023: Underwent CT myelogram: Large expansile lesion in the sacrum with extraosseous extension of the tumor, diffuse lytic lesions throughout the visualized spine with metastatic disease myeloma is considered less likely.  (This was ordered by Dr. Marcene Corning)   Treatment plan: 1.  PET CT scan 12/27/2021: Widespread bone metastatic disease largest lesion involving the sacrum with pathological fractures of T6 and T10 retroperitoneal and pelvic lymph node metastasis, right axillary lymph node, activity in the pancreatic head, hypermetabolic activity in the adrenal glands, right thyroid nodule. 2. biopsy of sacrum:  12/26/2021: Metastatic breast cancer, ER 90%, PR 10%, HER2 negative (0) 3.  Treatment plan: Ibrance along with Faslodex started 12/25/2021 4.  Xgeva for bone metastases.  Every 3 months 5.  Palliative radiation to the sacrum completed 01/19/2022 Genetic testing for BRCA analysis. -------------------------------------------------------------------------------------------------------------------------------------- Current treatment: Ibrance with Faslodex and Zometa, started 12/25/2021 Toxicities: Leukopenia: Currently on Ibrance 75 mg.   Fatigue Alternating constipation and diarrhea Severe bone pain: Patient is not taking any pain medications currently because the pain is now under very good control without it.   CT CAP: 06/18/2022: Overall appearance similar to previous exam.  Makes it lucent and sclerotic bone metastases similar.  Stable soft tissue nodule pancreas, stable adrenal nodules, small right axillary lymph node slightly decreased   Skin punch biopsy 07/23/2022: Breast cancer ER 95%, PR 0%, HER2 2+ by IHC, FISH negative ratio 1.1 (with HER2 being 2+ positive she would be eligible for Enhertu) Knee swelling: Saw orthopedics Hospitalization at [facility]: Dehydration, CT head showing sinus bone lesions resulting in growth in the brain.  Dr. Kathrynn Running with radiation oncology felt that the lesions were not significant enough to require palliative radiation.  Return to clinic monthly for injections and follow-ups.    No orders of the defined types were placed in this encounter.  The patient has a good understanding of the overall plan. she agrees with it. she will call with any problems that may develop before the next visit here. Total time spent: 30 mins including face to face time and time spent for planning, charting and co-ordination of care   Tamsen Meek, MD 09/14/22    I Janan Ridge am acting as a Neurosurgeon for [employer]  I have reviewed the above documentation  for accuracy and completeness, and I agree with the above.

## 2022-09-14 NOTE — Telephone Encounter (Signed)
Elevated AST: I suspect that this is related to her treatment with either anastrozole or Ibrance. I instructed her to stop the Ibrance and anastrozole for 2 weeks. Recheck labs in 2 weeks and then make a decision.

## 2022-09-14 NOTE — Progress Notes (Signed)
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     ICD-10-CM   1. Metastasis to bone (HCC)  C79.51     2. Malignant neoplasm of lower-outer quadrant of left breast of female, estrogen receptor positive (HCC)  C50.512    Z17.0       HISTORY OF PRESENT ILLNESS::Kathryn Lucas is a 62 y.o. female who is seen at the request of Dr. Pamelia Hoit. She was originally diagnosed with low grade Intraductal carcinoma of the left breast, ER 90%, PR 50%, Ki-67 15%, Her2 negative in 2018. She was treated with neoadjuvant antiestrogen therapy followed by a left mastectomy, adjuvant radiation therapy, and anti-estrogen therapy.   In August of 2023 she developed low back pain. CT myelogram showed a large lesion in the sacrum with extraosseous extension of the tumor, diffuse lytic lesions throughout the visualized spine. Biopsy of the sacrum on 12/26/21 showed metastatic breast cancer. PET CT on 10/14 showed widespread bone metastatic disease largest lesion involving the sacrum with pathological fractures of T6 and T10 retroperitoneal and pelvic lymph node metastasis, right axillary lymph node, activity in the pancreatic head, hypermetabolic activity in the adrenal glands, right thyroid nodule. Patient was started on Ibrance and Faslodex along with Xgeva for bone metastases. She also completed palliative radiation to the sacrum on 01/19/22. She continued with Ibrance with Faslodex and Zometa under the care of Dr. Pamelia Hoit.   Most recently, she presented ot the ER on 09/06/22 for dysphasia and confusion. CT of the head without contrast showed no acute intracranial hemorrhage; multiple lytic lesions in the calvarium, suspicious for metastatic disease; several metastatic lesions  involving the bilateral frontal calvarium with extension into the epidural space overlying the bilateral frontal lobes; several lytic lesions in the visualized skull base and upper cervical spine, suspicious for metastatic disease; and a large number of subcutaneous nodules in the scalp, also suspicious for metastatic disease. Chest x-ray showed a right midlung zone and lower lobe consolidation. Patient was admitted for further work up.   CT of the chest abdomen and pelvis on 09/07/22 showed extensive multifocal osseous metastatic disease with many lesions demonstrate soft tissue components; T6 and T10 compression deformities without significant retropulsion of the posterior vertebral body cortex; and RIGHT infrahilar and RIGHT middle lobe indeterminate consolidative opacities with attention on follow-up exams recommended. CT of the head with and without contrast showed extensive osseous metastatic disease with intracranial extension as detailed above; involvement at the orbits bilaterally; extensive scalp lesions which are nonspecific though may reflect metastatic disease;  and paranasal sinus disease and mastoid air cell disease. Notably, a MRI was unable to be performed due to the fact that she still has expanders in place since her mastectomy. Patient's symptoms improved with steroids. She was discharged with dexamethasone 4mg  q6hrs with recommendations to follow up with her oncologist.   She was kindly referred to [place] today to discuss radiation to the brain.  PREVIOUS RADIATION THERAPY: Yes   Physician: Dr. Dorothy Puffer Intent: Curative Radiation Treatment Dates: 01/06/2022 through 01/19/2022 Site Technique Total Dose (Gy) Dose per Fx (Gy) Completed Fx Beam Energies  Sacrum: Spine_Sacrum 3D 30/30 3 10/10 10X, 15X  Thoracic Spine: Spine_T              Past  Medical History:  Diagnosis Date   Breast cancer (HCC)    left breast cancer   Cancer (HCC) 09/2017   left breast cancer   Family history of  breast cancer    Hypothyroidism    Personal history of radiation therapy 2019   Thyroid disease   :   Past Surgical History:  Procedure Laterality Date   BREAST BIOPSY Left 2018   BREAST BIOPSY Left 2019   BREAST RECONSTRUCTION WITH PLACEMENT OF TISSUE EXPANDER AND ALLODERM Left 10/26/2017   Procedure: LEFT BREAST RECONSTRUCTION WITH PLACEMENT OF TISSUE EXPANDER AND ALLODERM;  Surgeon: Glenna Fellows, MD;  Location: [facility];  Service: Plastics;  Laterality: Left;   MASTECTOMY Left 2019   MASTECTOMY WITH RADIOACTIVE SEED GUIDED EXCISION AND AXILLARY SENTINEL LYMPH NODE BIOPSY Left 10/26/2017   Procedure: LEFT MASTECTOMY WITH SEED TARGETED  LEFT AXILLARY LYMPH NODE EXCISION AND LEFT SENTINEL LYMPH NODE BIOPSY;  Surgeon: Almond Lint, MD;  Location:  SURGERY CENTER;  Service: General;  Laterality: Left;   TONSILLECTOMY     WISDOM TOOTH EXTRACTION    :   Current Outpatient Medications:    azelastine (ASTELIN) 0.1 % nasal spray, Place into both nostrils 2 (two) times daily., Disp: , Rfl:    dexamethasone (DECADRON) 4 MG tablet, Take by mouth., Disp: , Rfl:    famotidine (PEPCID) 20 MG tablet, Take 20 mg by mouth 2 (two) times daily., Disp: , Rfl:    omeprazole (PRILOSEC) 40 MG capsule, Take by mouth., Disp: , Rfl:    anastrozole (ARIMIDEX) 1 MG tablet, Take 1 tablet (1 mg total) by mouth daily., Disp: 90 tablet, Rfl: 3   Calcium 500-100 MG-UNIT CHEW, Chew 1 tablet by mouth daily., Disp: 60 tablet, Rfl:    cetirizine (ZYRTEC) 10 MG tablet, Take by mouth., Disp: , Rfl:    cholecalciferol (VITAMIN D3) 25 MCG (1000 UNIT) tablet, Take 1 tablet (1,000 Units total) by mouth daily., Disp: , Rfl:    fluticasone (FLONASE) 50 MCG/ACT nasal spray, Place into the nose., Disp: , Rfl:    gabapentin (NEURONTIN) 300 MG capsule, Take 300 mg by mouth 3 (three) times daily., Disp: , Rfl:    levothyroxine (SYNTHROID) 112 MCG tablet, Take 1 tablet by mouth daily., Disp: , Rfl:     levothyroxine (SYNTHROID, LEVOTHROID) 175 MCG tablet, Take 112 mcg by mouth. , Disp: , Rfl:    LORazepam (ATIVAN) 0.5 MG tablet, Take 1 tablet (0.5 mg total) by mouth at bedtime., Disp: 30 tablet, Rfl: 0   ondansetron (ZOFRAN) 8 MG tablet, Take 1 tablet (8 mg total) by mouth every 8 (eight) hours as needed for nausea., Disp: 30 tablet, Rfl: 3   palbociclib (IBRANCE) 75 MG tablet, Take 1 tablet (75 mg total) by mouth daily. Take for 21 days. (As directed by MD), Disp: 21 tablet, Rfl: 0   pantoprazole (PROTONIX) 40 MG tablet, Take by mouth., Disp: , Rfl:    vitamin C (ASCORBIC ACID) 250 MG tablet, Take 1 tablet (250 mg total) by mouth daily., Disp: , Rfl:    zinc gluconate 50 MG tablet, Take 1 tablet (50 mg total) by mouth daily., Disp: , Rfl: :   Allergies  Allergen Reactions   Percocet [Oxycodone-Acetaminophen] Nausea And Vomiting    Severe GI upset and vomiting  :   Family History  Problem Relation Age of Onset   Breast cancer Mother 53   Stroke Sister        thought to be due  to tamoxifen use   Dementia Maternal Grandmother    COPD Paternal Grandfather   :   Social History   Socioeconomic History   Marital status: Single    Spouse name: Not on file   Number of children: Not on file   Years of education: Not on file   Highest education level: Not on file  Occupational History   Not on file  Tobacco Use   Smoking status: Never   Smokeless tobacco: Never  Vaping Use   Vaping Use: Never used  Substance and Sexual Activity   Alcohol use: Yes    Alcohol/week: 0.0 standard drinks of alcohol    Comment: social   Drug use: Never   Sexual activity: Not on file  Other Topics Concern   Not on file  Social History Narrative   Lives alone and one dog.   Social Determinants of Health   Financial Resource Strain: Not on file  Food Insecurity: No Food Insecurity (09/14/2022)   Hunger Vital Sign    Worried About Running Out of Food in the Last Year: Never true    Ran Out of  Food in the Last Year: Never true  Transportation Needs: No Transportation Needs (09/14/2022)   PRAPARE - Administrator, Civil Service (Medical): No    Lack of Transportation (Non-Medical): No  Physical Activity: Not on file  Stress: Not on file  Social Connections: Not on file  Intimate Partner Violence: Not At Risk (09/14/2022)   Humiliation, Afraid, Rape, and Kick questionnaire    Fear of Current or Ex-Partner: No    Emotionally Abused: No    Physically Abused: No    Sexually Abused: No  :  REVIEW OF SYSTEMS:  A 15 point review of systems is documented in the electronic medical record. Patient endorses some right eye blurriness in the mornings. She denies diplopia. She denies headaches, nausea, numbness, tingling, or muscle weakness. She denies confusion since her admission.     PHYSICAL EXAM:  Blood pressure (!) 145/96, pulse (!) 111, temperature 97.6 F (36.4 C), resp. rate 18, height 5\' 11"  (1.803 m), weight 144 lb 12.8 oz (65.7 kg), SpO2 98 %. In general this is a well appearing female in no acute distress. she's alert and oriented x4 and appropriate throughout the examination. Cardiopulmonary assessment is negative for acute distress and she exhibits normal effort.  CN II-VII grossly intact. Symmetric strength and sensation demonstrated throughout.    KPS = 90  100 - Normal; no complaints; no evidence of disease. 90   - Able to carry on normal activity; minor signs or symptoms of disease. 80   - Normal activity with effort; some signs or symptoms of disease. 39   - Cares for self; unable to carry on normal activity or to do active work. 60   - Requires occasional assistance, but is able to care for most of his personal needs. 50   - Requires considerable assistance and frequent medical care. 40   - Disabled; requires special care and assistance. 30   - Severely disabled; hospital admission is indicated although death not imminent. 20   - Very sick; hospital  admission necessary; active supportive treatment necessary. 10   - Moribund; fatal processes progressing rapidly. 0     - Dead  Karnofsky DA, Abelmann WH, Craver LS and [facility] [phone]) The use of the nitrogen mustards in the palliative treatment of carcinoma: with particular reference to bronchogenic carcinoma Cancer 1 634-56  LABORATORY DATA:  Lab Results  Component Value Date   WBC 6.7 09/14/2022   HGB 14.4 09/14/2022   HCT 40.4 09/14/2022   MCV 100.0 09/14/2022   PLT 177 09/14/2022   Lab Results  Component Value Date   NA 132 (L) 09/14/2022   K 4.0 09/14/2022   CL 100 09/14/2022   CO2 22 09/14/2022   Lab Results  Component Value Date   ALT 30 09/14/2022   AST 309 (HH) 09/14/2022   ALKPHOS 77 09/14/2022   BILITOT 0.4 09/14/2022     RADIOGRAPHY: No results found. We personally reviewed her CT of the head.     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   It was a pleasure meeting this patient today. Patient presented with confusion to the ER after a taxing day in the heat. CT of the head showed diffuse osseous metastasis with intracranial extension and involvement at the orbits. She was placed on Decadron 4mg  every 6 hours and discharged. Today she remains asymptomatic and believes she experienced confusion from dehydration after a long day. She denies any neurologic deficits. Since she remains asymptomatic and imaging shows no cerebral involvement, radiation is not recommended at this time.   PLAN:  Patient will remain under the care of Dr. Pamelia Hoit for further management. She will see him today at 3 pm. Recommend steroid taper.  We appreciate the consult and will gladly reassess if Dr. Pamelia Hoit deem it necessary in the future.    We personally spent 45 minutes in this encounter including chart review, reviewing radiological studies, meeting face-to-face with the patient, entering orders and completing documentation.     ------------------------------------------------   Joyice Faster, PA-C    Margaretmary Dys, MD  [facility]  Radiation Oncology Direct Dial: [phone]  Fax: [phone] [email or web address]  Skype  LinkedIn

## 2022-09-15 ENCOUNTER — Other Ambulatory Visit: Payer: Self-pay

## 2022-09-15 ENCOUNTER — Emergency Department (HOSPITAL_COMMUNITY)
Admission: EM | Admit: 2022-09-15 | Discharge: 2022-09-15 | Discharge status: Against Medical Advice | Payer: Medicaid Other | Attending: Emergency Medicine | Admitting: Emergency Medicine

## 2022-09-15 ENCOUNTER — Encounter (HOSPITAL_COMMUNITY): Payer: Self-pay

## 2022-09-15 DIAGNOSIS — H5789 Other specified disorders of eye and adnexa: Secondary | ICD-10-CM | POA: Insufficient documentation

## 2022-09-15 DIAGNOSIS — Z85830 Personal history of malignant neoplasm of bone: Secondary | ICD-10-CM | POA: Diagnosis not present

## 2022-09-15 DIAGNOSIS — Z853 Personal history of malignant neoplasm of breast: Secondary | ICD-10-CM | POA: Diagnosis not present

## 2022-09-15 DIAGNOSIS — Z5321 Procedure and treatment not carried out due to patient leaving prior to being seen by health care provider: Secondary | ICD-10-CM | POA: Insufficient documentation

## 2022-09-15 NOTE — ED Triage Notes (Signed)
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 Eye doctor wants to rule out increased ICP. Pt is currently on decadron every 8 hrs.  Recent admission for AMS, pt does have lesions on skull that were found recently.  Stage 4 breast cancer with mets to bone

## 2022-09-16 ENCOUNTER — Ambulatory Visit: Payer: Medicaid Other | Admitting: Hematology and Oncology

## 2022-09-16 ENCOUNTER — Ambulatory Visit: Payer: Medicaid Other | Admitting: Physical Therapy

## 2022-09-16 ENCOUNTER — Encounter: Payer: Self-pay | Admitting: Hematology and Oncology

## 2022-09-16 ENCOUNTER — Telehealth: Payer: Self-pay

## 2022-09-16 ENCOUNTER — Ambulatory Visit: Payer: Self-pay | Admitting: Pharmacist

## 2022-09-16 ENCOUNTER — Inpatient Hospital Stay: Payer: Medicaid Other

## 2022-09-16 VITALS — BP 144/89 | HR 118 | Temp 98.8°F | Resp 18

## 2022-09-16 DIAGNOSIS — Z9012 Acquired absence of left breast and nipple: Secondary | ICD-10-CM | POA: Insufficient documentation

## 2022-09-16 DIAGNOSIS — Z5111 Encounter for antineoplastic chemotherapy: Secondary | ICD-10-CM | POA: Diagnosis not present

## 2022-09-16 DIAGNOSIS — Z170 Estrogen receptor positive status [ER+]: Secondary | ICD-10-CM | POA: Insufficient documentation

## 2022-09-16 DIAGNOSIS — C799 Secondary malignant neoplasm of unspecified site: Secondary | ICD-10-CM

## 2022-09-16 DIAGNOSIS — Z79811 Long term (current) use of aromatase inhibitors: Secondary | ICD-10-CM | POA: Insufficient documentation

## 2022-09-16 DIAGNOSIS — C50512 Malignant neoplasm of lower-outer quadrant of left female breast: Secondary | ICD-10-CM | POA: Insufficient documentation

## 2022-09-16 DIAGNOSIS — C7951 Secondary malignant neoplasm of bone: Secondary | ICD-10-CM | POA: Insufficient documentation

## 2022-09-16 DIAGNOSIS — C7931 Secondary malignant neoplasm of brain: Secondary | ICD-10-CM | POA: Insufficient documentation

## 2022-09-16 MED ORDER — FULVESTRANT 250 MG/5ML IM SOSY
500.0000 mg | PREFILLED_SYRINGE | Freq: Once | INTRAMUSCULAR | Status: AC
  Administered 2022-09-16: 500 mg via INTRAMUSCULAR
  Filled 2022-09-16: qty 10

## 2022-09-16 MED ORDER — DENOSUMAB 120 MG/1.7ML ~~LOC~~ SOLN
120.0000 mg | Freq: Once | SUBCUTANEOUS | Status: AC
  Administered 2022-09-16: 120 mg via SUBCUTANEOUS
  Filled 2022-09-16: qty 1.7

## 2022-09-16 NOTE — Telephone Encounter (Signed)
Returned Pt's call regarding decadron rx wean schedule. Advised Pt to grab a pen and paper to record schedule below.   Per MD: Pt is to take one 4mg  tab every 8 hours for 3 days, then every 12 hours for 3 days, and then once a day for 3 days.  Pt verbalized understanding.

## 2022-09-18 ENCOUNTER — Encounter: Payer: Self-pay | Admitting: Hematology and Oncology

## 2022-09-18 ENCOUNTER — Other Ambulatory Visit (HOSPITAL_COMMUNITY): Payer: Self-pay

## 2022-09-21 ENCOUNTER — Ambulatory Visit: Payer: Medicaid Other | Admitting: Physical Therapy

## 2022-09-21 ENCOUNTER — Other Ambulatory Visit: Payer: Self-pay | Admitting: Ophthalmology

## 2022-09-21 DIAGNOSIS — H4710 Unspecified papilledema: Secondary | ICD-10-CM

## 2022-09-22 ENCOUNTER — Telehealth: Payer: Self-pay | Admitting: Internal Medicine

## 2022-09-22 ENCOUNTER — Telehealth: Payer: Self-pay

## 2022-09-22 DIAGNOSIS — C50912 Malignant neoplasm of unspecified site of left female breast: Secondary | ICD-10-CM

## 2022-09-22 DIAGNOSIS — C7951 Secondary malignant neoplasm of bone: Secondary | ICD-10-CM

## 2022-09-22 DIAGNOSIS — Z170 Estrogen receptor positive status [ER+]: Secondary | ICD-10-CM

## 2022-09-22 NOTE — Telephone Encounter (Signed)
Pt called and verbalized concern for difficulty with vision changes. She states she saw her ophthalmologist yesterday and he advised she has a swollen optic nerve. He ordered a head CT for pt but she had a recent ct head 6/28. Pt also reports she has a hoarse voice which also started last week.  Made MD aware of pt concerns and he would like pt referred to Dr Barbaraann Cao for consult. Pt and her sister are aware and know to call with any further concerns. Referral entered and message sent to Dr Barbaraann Cao. & nurse.

## 2022-09-22 NOTE — Telephone Encounter (Signed)
Patient is aware of upcoming appointment times/dates.  

## 2022-09-23 ENCOUNTER — Ambulatory Visit: Payer: Medicaid Other | Admitting: Physical Therapy

## 2022-09-23 ENCOUNTER — Other Ambulatory Visit: Payer: Medicaid Other

## 2022-09-24 ENCOUNTER — Other Ambulatory Visit: Payer: Self-pay

## 2022-09-24 ENCOUNTER — Inpatient Hospital Stay (HOSPITAL_BASED_OUTPATIENT_CLINIC_OR_DEPARTMENT_OTHER): Payer: Medicaid Other | Admitting: Internal Medicine

## 2022-09-24 VITALS — BP 132/92 | HR 110 | Temp 97.9°F | Resp 18

## 2022-09-24 DIAGNOSIS — Z5111 Encounter for antineoplastic chemotherapy: Secondary | ICD-10-CM | POA: Diagnosis not present

## 2022-09-24 DIAGNOSIS — C7951 Secondary malignant neoplasm of bone: Secondary | ICD-10-CM | POA: Diagnosis not present

## 2022-09-24 NOTE — Progress Notes (Signed)
[facility] [address]  [place], [place] [address] [phone]   New Patient Evaluation  Date of Service: 09/24/22 Patient Name: Kathryn Lucas Patient MRN: [id number] Patient DOB: Nov 03, 1960 Provider: Henreitta Leber, MD  Identifying Statement:  Kathryn Lucas is a 62 y.o. female with No diagnosis found. who presents for initial consultation and evaluation regarding cancer associated neurologic deficits.    Referring Provider: Ollen Bowl, MD [address] [place],  [place] [address]  Primary Cancer:  Oncologic History: Oncology History  Malignant neoplasm of lower-outer quadrant of left breast of female, estrogen receptor positive (HCC)  06/11/2016 Mammogram   Palpable left breast masses 3:00 position: 2.2 cm; 5:30 position: 2.5 cm; 6:30 position: 0.7 cm   06/19/2016 Initial Diagnosis   Left breast biopsy 3:30: IDC with DCIS grade 1, ER 90%, PR 50%, Ki-67 15%, HER-2 negative ratio 1.13; biopsy 5:30 position: IDC grade 1   07/13/2016 Breast MRI   Large area of abnormal enhancement lower inner and lower outer quadrants left breast spanning 9 cm x 6.4 cm x 5.3 cm, no abnormal enlarged lymph nodes; T3 N0 stage II a (New AJCC staging)    07/15/2016 - 12/08/2017 Anti-estrogen oral therapy   Neoadjuvant anastrozole 1 mg daily   07/17/2016 Oncotype testing   Testing done on the biopsy: Oncotype DX score 22, intermediate risk   02/02/2017 Breast MRI   Left breast multicentric disease unchanged measuring 2.7 x 1.6 cm.  Mass in the non-mass enhancement are also not significantly changed measuring 6.2 x 2.4 cm. new enhancing mass within the outer right breast 7 mm which could be fat necrosis or inclusion cyst    02/09/2017 Imaging   Ultrasound of the right breast lesion noted on MRI: No sonographic finding corresponds to the abnormality noted on MRI   07/13/2017 Cancer Staging   Staging form: Breast, AJCC 8th Edition - Clinical  stage from 07/13/2017: Stage IIA (cT3, cN0, cM0, G1, ER+, PR+, HER2-) - Signed by Loa Socks, NP on 05/18/2018   10/26/2017 Surgery   Left mastectomy: IDC grade 1, 2 foci largest spans 8.5 cm, intermediate grade DCIS, lymphovascular invasion identified, perineural invasion identified, 1/2 lymph nodes positive with extracapsular extension, ER 9200%, PR 5 to 50%, HER-2 negative, Ki-67 10 to 15%, T3N1A Mammaprint: low risk   11/02/2017 Cancer Staging   Staging form: Breast, AJCC 8th Edition - Pathologic: No Stage Recommended (ypT3, pN1a, cM0, G1, ER+, PR+, HER2-) - Signed by [name], MD on 11/02/2017   12/08/2017 - 01/26/2018 Radiation Therapy   Adjuvant radiation therapy    02/2018 -  Anti-estrogen oral therapy   Anastrozole 1 mg daily adjuvant therapy     History of Present Illness: The patient's records from the referring physician were obtained and reviewed and the patient interviewed to confirm this HPI.  Kathryn Lucas presents today to review recent neurologic symptoms.  She describes an episode of "sudden inability to speak, not making sense" which happened at the end of last month with her sister in [place].  This led to a hospitalization at [facility] for stroke evaluation.  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  She was started on decadron, which she attributes to subsequent blurry vision and double vision.  This has improved and mostly resolved since the steroids were stopped earlier in the week.  Continues to experience significant stiffness and immobility in her neck.  Medications: Current Outpatient Medications on File Prior to Visit  Medication Sig Dispense Refill   anastrozole (ARIMIDEX) 1 MG tablet Take 1 tablet (1 mg total) by mouth daily. 90 tablet 3   azelastine (ASTELIN) 0.1 % nasal spray Place into both nostrils 2 (two) times daily.     Calcium 500-100 MG-UNIT  CHEW Chew 1 tablet by mouth daily. 60 tablet    cetirizine (ZYRTEC) 10 MG tablet Take by mouth.     cholecalciferol (VITAMIN D3) 25 MCG (1000 UNIT) tablet Take 1 tablet (1,000 Units total) by mouth daily.     famotidine (PEPCID) 20 MG tablet Take 20 mg by mouth 2 (two) times daily.     fluticasone (FLONASE) 50 MCG/ACT nasal spray Place into the nose.     gabapentin (NEURONTIN) 300 MG capsule Take 300 mg by mouth 3 (three) times daily.     levothyroxine (SYNTHROID) 112 MCG tablet Take 1 tablet by mouth daily.     levothyroxine (SYNTHROID, LEVOTHROID) 175 MCG tablet Take 112 mcg by mouth.      LORazepam (ATIVAN) 0.5 MG tablet Take 1 tablet (0.5 mg total) by mouth at bedtime. 30 tablet 0   omeprazole (PRILOSEC) 40 MG capsule Take by mouth.     ondansetron (ZOFRAN) 8 MG tablet Take 1 tablet (8 mg total) by mouth every 8 (eight) hours as needed for nausea. 30 tablet 3   palbociclib (IBRANCE) 75 MG tablet Take 1 tablet (75 mg total) by mouth daily. Take for 21 days. (As directed by MD) 21 tablet 0   pantoprazole (PROTONIX) 40 MG tablet Take by mouth.     vitamin C (ASCORBIC ACID) 250 MG tablet Take 1 tablet (250 mg total) by mouth daily.     zinc gluconate 50 MG tablet Take 1 tablet (50 mg total) by mouth daily.     No current facility-administered medications on file prior to visit.    Allergies:  Allergies  Allergen Reactions   Percocet [Oxycodone-Acetaminophen] Nausea And Vomiting    Severe GI upset and vomiting   Past Medical History:  Past Medical History:  Diagnosis Date   Breast cancer (HCC)    left breast cancer   Cancer (HCC) 09/2017   left breast cancer   Family history of breast cancer    Hypothyroidism    Personal history of radiation therapy 2019   Thyroid disease    Past Surgical History:  Past Surgical History:  Procedure Laterality Date   BREAST BIOPSY Left 2018   BREAST BIOPSY Left 2019   BREAST RECONSTRUCTION WITH PLACEMENT OF TISSUE EXPANDER AND ALLODERM Left  10/26/2017   Procedure: LEFT BREAST RECONSTRUCTION WITH PLACEMENT OF TISSUE EXPANDER AND ALLODERM;  Surgeon: Glenna Fellows, MD;  Location: [facility];  Service: Plastics;  Laterality: Left;   MASTECTOMY Left 2019   MASTECTOMY WITH RADIOACTIVE SEED GUIDED EXCISION AND AXILLARY SENTINEL LYMPH NODE BIOPSY Left 10/26/2017   Procedure: LEFT MASTECTOMY WITH SEED TARGETED  LEFT AXILLARY LYMPH NODE EXCISION AND LEFT SENTINEL LYMPH NODE BIOPSY;  Surgeon: Almond Lint, MD;  Location: [facility];  Service: General;  Laterality: Left;   TONSILLECTOMY     WISDOM TOOTH EXTRACTION     Social History:  Social History   Socioeconomic History   Marital status: Single    Spouse name: Not on file   Number of children: Not on file   Years of education: Not on file  Highest education level: Not on file  Occupational History   Not on file  Tobacco Use   Smoking status: Never   Smokeless tobacco: Never  Vaping Use   Vaping status: Never Used  Substance and Sexual Activity   Alcohol use: Yes    Alcohol/week: 0.0 standard drinks of alcohol    Comment: social   Drug use: Never   Sexual activity: Not on file  Other Topics Concern   Not on file  Social History Narrative   Lives alone and one dog.   Social Determinants of Health   Financial Resource Strain: Not on file  Food Insecurity: No Food Insecurity (09/14/2022)   Hunger Vital Sign    Worried About Running Out of Food in the Last Year: Never true    Ran Out of Food in the Last Year: Never true  Transportation Needs: No Transportation Needs (09/14/2022)   PRAPARE - Administrator, Civil Service (Medical): No    Lack of Transportation (Non-Medical): No  Physical Activity: Not on file  Stress: Not on file  Social Connections: Not on file  Intimate Partner Violence: Not At Risk (09/14/2022)   Humiliation, Afraid, Rape, and Kick questionnaire    Fear of Current or Ex-Partner: No    Emotionally Abused: No     Physically Abused: No    Sexually Abused: No   Family History:  Family History  Problem Relation Age of Onset   Breast cancer Mother 57   Stroke Sister        thought to be due to tamoxifen use   Dementia Maternal Grandmother    COPD Paternal Grandfather     Review of Systems: Constitutional: Doesn't report fevers, chills or abnormal weight loss Eyes: Doesn't report blurriness of vision Ears, nose, mouth, throat, and face: Doesn't report sore throat Respiratory: Doesn't report cough, dyspnea or wheezes Cardiovascular: Doesn't report palpitation, chest discomfort  Gastrointestinal:  Doesn't report nausea, constipation, diarrhea GU: Doesn't report incontinence Skin: Doesn't report skin rashes Neurological: Per HPI Musculoskeletal: Doesn't report joint pain Behavioral/Psych: Doesn't report anxiety  Physical Exam: Vitals:   09/24/22 1510  BP: (!) 132/92  Pulse: (!) 110  Resp: 18  Temp: 97.9 F (36.6 C)   KPS: 70. General: Alert, cooperative, pleasant, in no acute distress Head: Limited ROM in neck, discomfort EENT: No conjunctival injection or scleral icterus.  Lungs: Resp effort normal Cardiac: Regular rate Abdomen: Non-distended abdomen Skin: No rashes cyanosis or petechiae. Extremities: No clubbing or edema  Neurologic Exam: Mental Status: Awake, alert, attentive to examiner. Oriented to self and environment. Language is fluent with intact comprehension.  Cranial Nerves: Visual acuity is grossly normal. Visual fields are full. Extra-ocular movements intact. No ptosis. Face is symmetric Motor: Tone and bulk are normal. Power is full in both arms and legs. Reflexes are symmetric, no pathologic reflexes present.  Sensory: Intact to light touch Gait: Deferred   Labs: I have reviewed the data as listed    Component Value Date/Time   NA 132 (L) 09/14/2022 1502   K 4.0 09/14/2022 1502   CL 100 09/14/2022 1502   CO2 22 09/14/2022 1502   GLUCOSE 101 (H)  09/14/2022 1502   BUN 25 (H) 09/14/2022 1502   CREATININE 0.64 09/14/2022 1502   CALCIUM 9.0 09/14/2022 1502   PROT 7.5 09/14/2022 1502   ALBUMIN 3.7 09/14/2022 1502   AST 309 (HH) 09/14/2022 1502   ALT 30 09/14/2022 1502   ALKPHOS 77 09/14/2022 1502  BILITOT 0.4 09/14/2022 1502   GFRNONAA >60 09/14/2022 1502   Lab Results  Component Value Date   WBC 6.7 09/14/2022   NEUTROABS 4.6 09/14/2022   HGB 14.4 09/14/2022   HCT 40.4 09/14/2022   MCV 100.0 09/14/2022   PLT 177 09/14/2022    Imaging: IMPRESSION:  1. Extensive osseous metastatic disease with intracranial extension as detailed above. There is involvement at the orbits bilaterally. Extensive scalp lesions which are nonspecific though may reflect metastatic disease. 2. Paranasal sinus disease and mastoid air cell disease as above. Narrative  This result has an attachment that is not available. CT HEAD WITH AND WITHOUT CONTRAST:  HISTORY:  Lytic lesions. Evaluate for metastatic disease. Metastatic breast cancer. Osseous metastatic disease.  COMPARISON: 09/06/2022  CONTRAST: 75 mL of Omnipaque 350.  TECHNIQUE: Routine axial images from the skull base to the vertex  DICOM format image data available to non-affiliated external healthcare facilities or entities on a secure, media free, reciprocally searchable basis with patient authorization for at least a 12 month period after the study.  Dose reduction technique was used on this scan by utilizing automated exposure control, adjustment of the mA and/or kV according to the patient size.  FINDINGS: Scout film: Reviewed  CT findings: There are extensive destructive/lytic lesions of the skull, with adjacent lobulated enhancing soft tissue compatible with intracranial, extra-axial extension. Findings most compatible with metastatic disease. In the frontal regions bilaterally, there is prominent intracranial extension of these lesions, with plaque-like extra-axial soft tissue  measuring up to 6 mm in thickness bilaterally. Portions of this intracranial disease is inseparable from the superior sagittal sinus. There is a destructive lesion involving the LEFT mastoid region at its superior aspect with intracranial extension of this lesion with extra-axial lobulated enhancing soft tissue. This soft tissue is inseparable from the junction of the LEFT transverse and sigmoid sinuses. There is a destructive lesion involving the occipital/suboccipital skull. There is lobulated enhancing soft tissue extending intracranially in an extra axial position which is inseparable from the region of the torcula/confluence of  venous sinuses and also the inferior aspect of the RIGHT transverse sinus.  There is some mild mass effect on the brain related to the intracranial extension of these lesions. No midline shift. There is also some extension of some of these lesions superficial to the skull.  There is a destructive lesion at the RIGHT sphenoid wing region extending to involve the lateral wall of the RIGHT orbit with intracranial and intraorbital extension. This appears contiguous with destructive lesion extending more anteriorly to involve the more anterior RIGHT lateral orbital wall and orbital roof with intraorbital extension and likely involvement of the RIGHT lacrimal gland. There is some mass effect on the RIGHT globe. Infiltrative masses involving the LEFT medial and inferior rectus orbital musculature, presumed metastatic disease with some mass effect on the optic nerve sheath complex.  Partially seen destructive lesions at the upper cervical spine.  There are extensive scalp lesions which are nonspecific but concerning for metastatic disease.  Normal size of the ventricles.  There is some apparent extension of metastatic disease into the frontal sinuses related to destructive osseous lesions. Moderate amount of layering fluid or mucous in the RIGHT frontal sinus. Mucosal thickening in  ethmoid air cells. Partial opacification of LEFT mastoid air cells. Procedure Note  Mercer Pod, MD - 09/08/2022 Formatting of this note might be different from the original. CT HEAD WITH AND WITHOUT CONTRAST:  HISTORY:  Lytic lesions. Evaluate for metastatic disease.  Metastatic breast cancer. Osseous metastatic disease.  COMPARISON: 09/06/2022  CONTRAST: 75 mL of Omnipaque 350.  TECHNIQUE: Routine axial images from the skull base to the vertex  DICOM format image data available to non-affiliated external healthcare facilities or entities on a secure, media free, reciprocally searchable basis with patient authorization for at least a 12 month period after the study.  Dose reduction technique was used on this scan by utilizing automated exposure control, adjustment of the mA and/or kV according to the patient size.  FINDINGS: Scout film: Reviewed  CT findings: There are extensive destructive/lytic lesions of the skull, with adjacent lobulated enhancing soft tissue compatible with intracranial, extra-axial extension. Findings most compatible with metastatic disease. In the frontal regions bilaterally, there is prominent intracranial extension of these lesions, with plaque-like extra-axial soft tissue measuring up to 6 mm in thickness bilaterally. Portions of this intracranial disease is inseparable from the superior sagittal sinus. There is a destructive lesion involving the LEFT mastoid region at its superior aspect with intracranial extension of this lesion with extra-axial lobulated enhancing soft tissue. This soft tissue is inseparable from the junction of the LEFT transverse and sigmoid sinuses. There is a destructive lesion involving the occipital/suboccipital skull. There is lobulated enhancing soft tissue extending intracranially in an extra axial position which is inseparable from the region of the torcula/confluence of  venous sinuses and also the inferior aspect of  the RIGHT transverse sinus.  There is some mild mass effect on the brain related to the intracranial extension of these lesions. No midline shift. There is also some extension of some of these lesions superficial to the skull.  There is a destructive lesion at the RIGHT sphenoid wing region extending to involve the lateral wall of the RIGHT orbit with intracranial and intraorbital extension. This appears contiguous with destructive lesion extending more anteriorly to involve the more anterior RIGHT lateral orbital wall and orbital roof with intraorbital extension and likely involvement of the RIGHT lacrimal gland. There is some mass effect on the RIGHT globe. Infiltrative masses involving the LEFT medial and inferior rectus orbital musculature, presumed metastatic disease with some mass effect on the optic nerve sheath complex.  Partially seen destructive lesions at the upper cervical spine.  There are extensive scalp lesions which are nonspecific but concerning for metastatic disease.  Normal size of the ventricles.  There is some apparent extension of metastatic disease into the frontal sinuses related to destructive osseous lesions. Moderate amount of layering fluid or mucous in the RIGHT frontal sinus. Mucosal thickening in ethmoid air cells. Partial opacification of LEFT mastoid air cells.   IMPRESSION:  1. Extensive osseous metastatic disease with intracranial extension as detailed above. There is involvement at the orbits bilaterally. Extensive scalp lesions which are nonspecific though may reflect metastatic disease. 2. Paranasal sinus disease and mastoid air cell disease as above. Exam End: 09/07/22 18:09    Assessment/Plan No diagnosis found.  Kathryn Lucas presents with clinical syndrome which may localize to the left frontal cortex.  Etiology is unclear, possibly a focal seizure with post-ictal encephalopathy.  Structural disease in the brain is mild and secondary to overlying  cranial metastases.  No edema or inflammation visualized.    MRI brain is not available because of expander in place.  Would not resume corticosteroids at this time.  Will also defer AED for now, unless episode repeats.  Limited ROM and pain in neck we suspect is secondary to bony metastases, based on findings elsewhere.  This is  causing functional and quality of life impairment.  We recommended obtaining CT cervical spine given potential benefit of palliative radiation in the neck.  For CNS tumor burden, recommended repeating CT head in 2 months.  We spent twenty additional minutes teaching regarding the natural history, biology, and historical experience in the treatment of neurologic complications of cancer.   We appreciate the opportunity to participate in the care of Kathryn Lucas.  We will give her a call after the C-spine study, and see her back in clinic in 2 months after repeat CT head.  All questions were answered. The patient knows to call the clinic with any problems, questions or concerns. No barriers to learning were detected.  The total time spent in the encounter was 40 minutes and more than 50% was on counseling and review of test results   Henreitta Leber, MD Medical Director of Neuro-Oncology [facility] 09/24/22 3:04 PM

## 2022-09-25 ENCOUNTER — Ambulatory Visit: Payer: Medicaid Other

## 2022-09-28 ENCOUNTER — Inpatient Hospital Stay: Payer: Medicaid Other

## 2022-09-28 ENCOUNTER — Other Ambulatory Visit: Payer: Self-pay

## 2022-09-28 ENCOUNTER — Ambulatory Visit: Payer: Medicaid Other | Admitting: Physical Therapy

## 2022-09-28 DIAGNOSIS — Z5111 Encounter for antineoplastic chemotherapy: Secondary | ICD-10-CM | POA: Diagnosis not present

## 2022-09-28 DIAGNOSIS — C50912 Malignant neoplasm of unspecified site of left female breast: Secondary | ICD-10-CM

## 2022-09-28 LAB — CBC WITH DIFFERENTIAL (CANCER CENTER ONLY)
Abs Immature Granulocytes: 0.02 10*3/uL (ref 0.00–0.07)
Basophils Absolute: 0 10*3/uL (ref 0.0–0.1)
Basophils Relative: 0 %
Eosinophils Absolute: 0.1 10*3/uL (ref 0.0–0.5)
Eosinophils Relative: 2 %
HCT: 37.1 % (ref 36.0–46.0)
Hemoglobin: 12.8 g/dL (ref 12.0–15.0)
Immature Granulocytes: 1 %
Lymphocytes Relative: 14 %
Lymphs Abs: 0.4 10*3/uL — ABNORMAL LOW (ref 0.7–4.0)
MCH: 35.7 pg — ABNORMAL HIGH (ref 26.0–34.0)
MCHC: 34.5 g/dL (ref 30.0–36.0)
MCV: 103.3 fL — ABNORMAL HIGH (ref 80.0–100.0)
Monocytes Absolute: 0.4 10*3/uL (ref 0.1–1.0)
Monocytes Relative: 13 %
Neutro Abs: 2.1 10*3/uL (ref 1.7–7.7)
Neutrophils Relative %: 70 %
Platelet Count: 116 10*3/uL — ABNORMAL LOW (ref 150–400)
RBC: 3.59 MIL/uL — ABNORMAL LOW (ref 3.87–5.11)
RDW: 15 % (ref 11.5–15.5)
WBC Count: 3 10*3/uL — ABNORMAL LOW (ref 4.0–10.5)
nRBC: 0 % (ref 0.0–0.2)

## 2022-09-28 LAB — CMP (CANCER CENTER ONLY)
ALT: 28 U/L (ref 0–44)
AST: 126 U/L — ABNORMAL HIGH (ref 15–41)
Albumin: 3.3 g/dL — ABNORMAL LOW (ref 3.5–5.0)
Alkaline Phosphatase: 82 U/L (ref 38–126)
Anion gap: 7 (ref 5–15)
BUN: 12 mg/dL (ref 8–23)
CO2: 26 mmol/L (ref 22–32)
Calcium: 8.7 mg/dL — ABNORMAL LOW (ref 8.9–10.3)
Chloride: 103 mmol/L (ref 98–111)
Creatinine: 0.71 mg/dL (ref 0.44–1.00)
GFR, Estimated: >60 mL/min (ref >60)
Glucose, Bld: 97 mg/dL (ref 70–99)
Potassium: 4 mmol/L (ref 3.5–5.1)
Sodium: 136 mmol/L (ref 135–145)
Total Bilirubin: 0.4 mg/dL (ref 0.3–1.2)
Total Protein: 6.1 g/dL — ABNORMAL LOW (ref 6.5–8.1)

## 2022-09-29 ENCOUNTER — Inpatient Hospital Stay (HOSPITAL_BASED_OUTPATIENT_CLINIC_OR_DEPARTMENT_OTHER): Payer: Medicaid Other | Admitting: Hematology and Oncology

## 2022-09-29 DIAGNOSIS — C50512 Malignant neoplasm of lower-outer quadrant of left female breast: Secondary | ICD-10-CM

## 2022-09-29 DIAGNOSIS — Z170 Estrogen receptor positive status [ER+]: Secondary | ICD-10-CM

## 2022-09-29 NOTE — Progress Notes (Signed)
HEMATOLOGY-ONCOLOGY TELEPHONE VISIT PROGRESS NOTE  I connected with our patient on 09/29/22 at 10:45 AM EDT by telephone and verified that I am speaking with the correct person using two identifiers.  I discussed the limitations, risks, security and privacy concerns of performing an evaluation and management service by telephone and the availability of in person appointments.  I also discussed with the patient that there may be a patient responsible charge related to this service. The patient expressed understanding and agreed to proceed.   History of Present Illness: Lab follow up since she came off Ibrance with letrozole.  She tells me that she has been feeling well overall.  Oncology History  Malignant neoplasm of lower-outer quadrant of left breast of female, estrogen receptor positive (HCC)  06/11/2016 Mammogram   Palpable left breast masses 3:00 position: 2.2 cm; 5:30 position: 2.5 cm; 6:30 position: 0.7 cm   06/19/2016 Initial Diagnosis   Left breast biopsy 3:30: IDC with DCIS grade 1, ER 90%, PR 50%, Ki-67 15%, HER-2 negative ratio 1.13; biopsy 5:30 position: IDC grade 1   07/13/2016 Breast MRI   Large area of abnormal enhancement lower inner and lower outer quadrants left breast spanning 9 cm x 6.4 cm x 5.3 cm, no abnormal enlarged lymph nodes; T3 N0 stage II a (New AJCC staging)    07/15/2016 - 12/08/2017 Anti-estrogen oral therapy   Neoadjuvant anastrozole 1 mg daily   07/17/2016 Oncotype testing   Testing done on the biopsy: Oncotype DX score 22, intermediate risk   02/02/2017 Breast MRI   Left breast multicentric disease unchanged measuring 2.7 x 1.6 cm.  Mass in the non-mass enhancement are also not significantly changed measuring 6.2 x 2.4 cm. new enhancing mass within the outer right breast 7 mm which could be fat necrosis or inclusion cyst    02/09/2017 Imaging   Ultrasound of the right breast lesion noted on MRI: No sonographic finding corresponds to the abnormality noted on  MRI   07/13/2017 Cancer Staging   Staging form: Breast, AJCC 8th Edition - Clinical stage from 07/13/2017: Stage IIA (cT3, cN0, cM0, G1, ER+, PR+, HER2-) - Signed by Loa Socks, NP on 05/18/2018   10/26/2017 Surgery   Left mastectomy: IDC grade 1, 2 foci largest spans 8.5 cm, intermediate grade DCIS, lymphovascular invasion identified, perineural invasion identified, 1/2 lymph nodes positive with extracapsular extension, ER 9200%, PR 5 to 50%, HER-2 negative, Ki-67 10 to 15%, T3N1A Mammaprint: low risk   11/02/2017 Cancer Staging   Staging form: Breast, AJCC 8th Edition - Pathologic: No Stage Recommended (ypT3, pN1a, cM0, G1, ER+, PR+, HER2-) - Signed by [name], MD on 11/02/2017   12/08/2017 - 01/26/2018 Radiation Therapy   Adjuvant radiation therapy    02/2018 -  Anti-estrogen oral therapy   Anastrozole 1 mg daily adjuvant therapy     REVIEW OF SYSTEMS:   Constitutional: Denies fevers, chills or abnormal weight loss All other systems were reviewed with the patient and are negative. Observations/Objective:     Assessment Plan:  Malignant neoplasm of lower-outer quadrant of left breast of female, estrogen receptor positive (HCC) 06/19/2016 Left breast biopsy 3:30: IDC with DCIS grade 1, ER 90%, PR 50%, Ki-67 15%, HER-2 negative ratio 1.13; biopsy 5:30 position: IDC grade 1   10/26/17: Left mastectomy: IDC grade 1, 2 foci largest spans 8.5 cm, intermediate grade DCIS, lymphovascular invasion identified, perineural invasion identified, 1/2 lymph nodes positive with extracapsular extension, ER 9200%, PR 5 to 50%, HER-2 negative, Ki-67 10  to 15%, T3N1A   Oncotype DX score 22, intermediate risk, chemotherapy not felt to have significant benefit.   Treatment Summary: 1. Antiestrogen therapy with anastrozole 1 mg daily started 07/15/2016 2. Mastectomy 10/26/2017, Mammaprint low risk luminal type A 3. Followed by adjuvant radiation 12/08/17- 01/26/18  4. Followed by adjuvant  antiestrogen therapy anastrozole started 01/17/2018 (originally started 07/15/2016) -------------------------------------------------------------------- Low back pain August 2023: Underwent CT myelogram: Large expansile lesion in the sacrum with extraosseous extension of the tumor, diffuse lytic lesions throughout the visualized spine with metastatic disease myeloma is considered less likely.  (This was ordered by Dr. Marcene Corning)   Treatment plan: 1.  PET CT scan 12/27/2021: Widespread bone metastatic disease largest lesion involving the sacrum with pathological fractures of T6 and T10 retroperitoneal and pelvic lymph node metastasis, right axillary lymph node, activity in the pancreatic head, hypermetabolic activity in the adrenal glands, right thyroid nodule. 2. biopsy of sacrum: 12/26/2021: Metastatic breast cancer, ER 90%, PR 10%, HER2 negative (0) 3.  Treatment plan: Ibrance along with Faslodex started 12/25/2021 4.  Xgeva for bone metastases.  Every 3 months 5.  Palliative radiation to the sacrum completed 01/19/2022 Genetic testing for BRCA analysis. -------------------------------------------------------------------------------------------------------------------------------------- Current treatment: Ibrance with Faslodex and Zometa, started 12/25/2021 Toxicities: Leukopenia: Currently on Ibrance 75 mg.   Fatigue Alternating constipation and diarrhea Elevated AST 309: Ibrance and anastrozole held for 2 weeks.  AST improved to 126, recommended restarting Ibrance and then recheck labs in 2 weeks  Skin punch biopsy 07/23/2022: Breast cancer ER 95%, PR 0%, HER2 2+ by IHC, FISH negative ratio 1.1 (with HER2 being 2+ positive she would be eligible for Enhertu)   2-week lab and a telephone visit follow-up day after  I discussed the assessment and treatment plan with the patient. The patient was provided an opportunity to ask questions and all were answered. The patient agreed with the plan and  demonstrated an understanding of the instructions. The patient was advised to call back or seek an in-person evaluation if the symptoms worsen or if the condition fails to improve as anticipated.   I provided 12 minutes of non-face-to-face time during this encounter.  This includes time for charting and coordination of care   Tamsen Meek, MD

## 2022-09-29 NOTE — Assessment & Plan Note (Signed)
06/19/2016 Left breast biopsy 3:30: IDC with DCIS grade 1, ER 90%, PR 50%, Ki-67 15%, HER-2 negative ratio 1.13; biopsy 5:30 position: IDC grade 1   10/26/17: Left mastectomy: IDC grade 1, 2 foci largest spans 8.5 cm, intermediate grade DCIS, lymphovascular invasion identified, perineural invasion identified, 1/2 lymph nodes positive with extracapsular extension, ER 9200%, PR 5 to 50%, HER-2 negative, Ki-67 10 to 15%, T3N1A   Oncotype DX score 22, intermediate risk, chemotherapy not felt to have significant benefit.   Treatment Summary: 1. Antiestrogen therapy with anastrozole 1 mg daily started 07/15/2016 2. Mastectomy 10/26/2017, Mammaprint low risk luminal type A 3. Followed by adjuvant radiation 12/08/17- 01/26/18  4. Followed by adjuvant antiestrogen therapy anastrozole started 01/17/2018 (originally started 07/15/2016) -------------------------------------------------------------------- Low back pain August 2023: Underwent CT myelogram: Large expansile lesion in the sacrum with extraosseous extension of the tumor, diffuse lytic lesions throughout the visualized spine with metastatic disease myeloma is considered less likely.  (This was ordered by Dr. Marcene Corning)   Treatment plan: 1.  PET CT scan 12/27/2021: Widespread bone metastatic disease largest lesion involving the sacrum with pathological fractures of T6 and T10 retroperitoneal and pelvic lymph node metastasis, right axillary lymph node, activity in the pancreatic head, hypermetabolic activity in the adrenal glands, right thyroid nodule. 2. biopsy of sacrum: 12/26/2021: Metastatic breast cancer, ER 90%, PR 10%, HER2 negative (0) 3.  Treatment plan: Ibrance along with Faslodex started 12/25/2021 4.  Xgeva for bone metastases.  Every 3 months 5.  Palliative radiation to the sacrum completed 01/19/2022 Genetic testing for BRCA  analysis. -------------------------------------------------------------------------------------------------------------------------------------- Current treatment: Ibrance with Faslodex and Zometa, started 12/25/2021 Toxicities: Leukopenia: Currently on Ibrance 75 mg.   Fatigue Alternating constipation and diarrhea Elevated AST 309: Ibrance and anastrozole held for 2 weeks.  AST improved to 126, recommended restarting Ibrance and then recheck labs in 2 weeks  Skin punch biopsy 07/23/2022: Breast cancer ER 95%, PR 0%, HER2 2+ by IHC, FISH negative ratio 1.1 (with HER2 being 2+ positive she would be eligible for Enhertu)

## 2022-09-30 ENCOUNTER — Ambulatory Visit: Payer: Medicaid Other | Admitting: Physical Therapy

## 2022-10-01 ENCOUNTER — Ambulatory Visit
Admission: RE | Admit: 2022-10-01 | Discharge: 2022-10-01 | Disposition: A | Discharge status: Home / Self Care | Payer: Medicaid Other | Source: Ambulatory Visit | Attending: Internal Medicine | Admitting: Internal Medicine

## 2022-10-01 DIAGNOSIS — C7951 Secondary malignant neoplasm of bone: Secondary | ICD-10-CM | POA: Insufficient documentation

## 2022-10-01 MED ORDER — IOHEXOL 300 MG/ML  SOLN
75.0000 mL | Freq: Once | INTRAMUSCULAR | Status: AC | PRN
  Administered 2022-10-01: 75 mL via INTRAVENOUS

## 2022-10-02 ENCOUNTER — Telehealth: Payer: Self-pay | Admitting: Hematology and Oncology

## 2022-10-02 NOTE — Telephone Encounter (Signed)
 Scheduled appointments per 7/16 los. Patient is aware of the made appointments.

## 2022-10-05 ENCOUNTER — Ambulatory Visit: Payer: Medicaid Other | Admitting: Physical Therapy

## 2022-10-06 ENCOUNTER — Other Ambulatory Visit: Payer: Self-pay

## 2022-10-06 ENCOUNTER — Other Ambulatory Visit (HOSPITAL_COMMUNITY): Payer: Self-pay

## 2022-10-06 ENCOUNTER — Telehealth: Payer: Self-pay | Admitting: Radiation Oncology

## 2022-10-06 ENCOUNTER — Telehealth: Payer: Self-pay

## 2022-10-06 DIAGNOSIS — C7951 Secondary malignant neoplasm of bone: Secondary | ICD-10-CM

## 2022-10-06 DIAGNOSIS — R1310 Dysphagia, unspecified: Secondary | ICD-10-CM

## 2022-10-06 DIAGNOSIS — C50512 Malignant neoplasm of lower-outer quadrant of left female breast: Secondary | ICD-10-CM

## 2022-10-06 NOTE — Telephone Encounter (Signed)
Returned Pt's call and LVM regarding difficulty swallowing. Pt asking if difficulty swallowing can be explained through her CT cervical spine. Per MD, difficulty swallowing is r/t "Moderate esophageal dysmotility" found on  08/04/22 barium swallow study. Referral for GI placed. MD also recommends neck radiation based on 10/01/22 CT cervical spine. Pt can discuss further with Dr. Barbaraann Cao on 10/08/22. Gave call back number with any questions.

## 2022-10-06 NOTE — Telephone Encounter (Signed)
I spoke with the patient and her sister Coy Saunas and we reviewed the CT scan results, the concerns of the location of her disease and Dr. Joellen Jersey recommendations for 10 fractions of palliative radiotherapy to the cervical spine. The patient lives in [place] and may be interested in treatment closer to home. We discussed the need for a mask for immobilization. We discussed the risks, benefits, short, and long term effects of radiotherapy, as well as the curative intent, and the patient is interested in proceeding. Dr. Mitzi Hansen discusses the delivery and logistics of radiotherapy and anticipates a course of 10 fractions of radiotherapy.  The patient and her sister will decide how she'd like to proceed and let us know this afternoon. She is having stiffness in her neck, soreness with swallowing and hoarseness for the past few weeks. She reports she is taking ibrance but not her antiestrogen therapy.  She denies any neck pain, or radicular symptoms, though she had previously had shooting/numbness from the neck into the hands, this has resolved.  The patient called back and wants to proceed with treatment in [place]. A STAT referral was placed for her to be seen by Dr. Rushie Chestnut.

## 2022-10-07 ENCOUNTER — Ambulatory Visit: Payer: Medicaid Other | Admitting: Physical Therapy

## 2022-10-07 ENCOUNTER — Telehealth: Payer: Self-pay | Admitting: Dietician

## 2022-10-07 ENCOUNTER — Encounter: Payer: Medicaid Other | Admitting: Dietician

## 2022-10-07 ENCOUNTER — Encounter: Payer: Self-pay | Admitting: Radiation Oncology

## 2022-10-07 ENCOUNTER — Ambulatory Visit
Admission: RE | Admit: 2022-10-07 | Discharge: 2022-10-07 | Disposition: A | Discharge status: Home / Self Care | Payer: Medicaid Other | Source: Ambulatory Visit | Attending: Radiation Oncology | Admitting: Radiation Oncology

## 2022-10-07 VITALS — BP 124/79 | HR 108 | Temp 98.5°F | Resp 16 | Ht 71.0 in

## 2022-10-07 DIAGNOSIS — Z5111 Encounter for antineoplastic chemotherapy: Secondary | ICD-10-CM | POA: Diagnosis not present

## 2022-10-07 DIAGNOSIS — C7951 Secondary malignant neoplasm of bone: Secondary | ICD-10-CM | POA: Insufficient documentation

## 2022-10-07 DIAGNOSIS — C50512 Malignant neoplasm of lower-outer quadrant of left female breast: Secondary | ICD-10-CM | POA: Insufficient documentation

## 2022-10-07 DIAGNOSIS — Z170 Estrogen receptor positive status [ER+]: Secondary | ICD-10-CM | POA: Insufficient documentation

## 2022-10-07 NOTE — Consult Note (Signed)
NEW PATIENT EVALUATION  Name: Kathryn Lucas  MRN: [id number]  Date:   10/07/2022     DOB: 1960/11/15   This 62 y.o. female patient presents to the clinic for initial evaluation of metastasis to his cervical spine patient with known stage IV breast cancer.  REFERRING PHYSICIAN: Pahwani, [name], MD  CHIEF COMPLAINT:  Chief Complaint  Patient presents with   Follow-up    Mets to the Spine    DIAGNOSIS: The encounter diagnosis was Metastasis to bone [facility]).   PREVIOUS INVESTIGATIONS:  CT scans reviewed Pathology reports reviewed Clinical notes reviewed  HPI: Is a 62 year old female with ER positive invasive mammary carcinoma the lower outer quadrant of her left breast.  She had a mastectomy in 2019 with an 8.5 cm intermediate grade DCIS lymph-vascular invasion identified perineural invasion identified with positive nodes with extracapsular extension.  She had been on anastrozole.  She had adjuvant radiation therapy back in 2019.  She is also been treated in August 23 for a large expansile lesion in the sacrum with extraosseous extension.  Her PET scan in 23 showed widespread metastatic disease with pathologic fractures of T6 and T10.  She completed palliative radiation therapy to her sacrum back in 23.  Recently she has had difficulty rotating her neck she is found to have extensive osseous metastatic disease throughout her spine.  She has extensive involvement bilaterally of C1 with lateral masses which are both pathologically collapsed.  She also has soft tissue extension of tumor arising from C1 C5 and C6 facets.  Compression deformity of C3 vertebral body with mild bony retropulsion appears grossly similar to priors PET/CT.  She was seen by Dr. Mitzi Hansen in [place] with recommendation for palliative radiation therapy to her cervical spine is now seen closer to home and [place].  She states she is not having that much pain at this time.  PLANNED TREATMENT REGIMEN: Palliative ration  therapy to cervical spine  PAST MEDICAL HISTORY:  has a past medical history of Breast cancer (HCC), Cancer (HCC) (09/2017), Family history of breast cancer, Hypothyroidism, Personal history of radiation therapy (2019), and Thyroid disease.    PAST SURGICAL HISTORY:  Past Surgical History:  Procedure Laterality Date   BREAST BIOPSY Left 2018   BREAST BIOPSY Left 2019   BREAST RECONSTRUCTION WITH PLACEMENT OF TISSUE EXPANDER AND ALLODERM Left 10/26/2017   Procedure: LEFT BREAST RECONSTRUCTION WITH PLACEMENT OF TISSUE EXPANDER AND ALLODERM;  Surgeon: Glenna Fellows, MD;  Location: [facility];  Service: Plastics;  Laterality: Left;   MASTECTOMY Left 2019   MASTECTOMY WITH RADIOACTIVE SEED GUIDED EXCISION AND AXILLARY SENTINEL LYMPH NODE BIOPSY Left 10/26/2017   Procedure: LEFT MASTECTOMY WITH SEED TARGETED  LEFT AXILLARY LYMPH NODE EXCISION AND LEFT SENTINEL LYMPH NODE BIOPSY;  Surgeon: Almond Lint, MD;  Location: [facility];  Service: General;  Laterality: Left;   TONSILLECTOMY     WISDOM TOOTH EXTRACTION      FAMILY HISTORY: family history includes Breast cancer (age of onset: 83) in her mother; COPD in her paternal grandfather; Dementia in her maternal grandmother; Stroke in her sister.  SOCIAL HISTORY:  reports that she has never smoked. She has never used smokeless tobacco. She reports current alcohol use. She reports that she does not use drugs.  ALLERGIES: Percocet [oxycodone-acetaminophen]  MEDICATIONS:  Current Outpatient Medications  Medication Sig Dispense Refill   anastrozole (ARIMIDEX) 1 MG tablet Take 1 tablet (1 mg total) by mouth daily. 90 tablet 3  azelastine (ASTELIN) 0.1 % nasal spray Place into both nostrils 2 (two) times daily.     Calcium 500-100 MG-UNIT CHEW Chew 1 tablet by mouth daily. 60 tablet    cetirizine (ZYRTEC) 10 MG tablet Take by mouth.     cholecalciferol (VITAMIN D3) 25 MCG (1000 UNIT) tablet Take 1 tablet (1,000 Units  total) by mouth daily.     famotidine (PEPCID) 20 MG tablet Take 20 mg by mouth 2 (two) times daily.     fluticasone (FLONASE) 50 MCG/ACT nasal spray Place into the nose.     gabapentin (NEURONTIN) 300 MG capsule Take 300 mg by mouth 3 (three) times daily.     levothyroxine (SYNTHROID) 112 MCG tablet Take 1 tablet by mouth daily.     levothyroxine (SYNTHROID, LEVOTHROID) 175 MCG tablet Take 112 mcg by mouth.      LORazepam (ATIVAN) 0.5 MG tablet Take 1 tablet (0.5 mg total) by mouth at bedtime. 30 tablet 0   omeprazole (PRILOSEC) 40 MG capsule Take by mouth.     ondansetron (ZOFRAN) 8 MG tablet Take 1 tablet (8 mg total) by mouth every 8 (eight) hours as needed for nausea. 30 tablet 3   palbociclib (IBRANCE) 75 MG tablet Take 1 tablet (75 mg total) by mouth daily. Take for 21 days. (As directed by MD) (Patient not taking: Reported on 09/24/2022) 21 tablet 0   pantoprazole (PROTONIX) 40 MG tablet Take by mouth.     vitamin C (ASCORBIC ACID) 250 MG tablet Take 1 tablet (250 mg total) by mouth daily.     zinc gluconate 50 MG tablet Take 1 tablet (50 mg total) by mouth daily.     No current facility-administered medications for this encounter.    ECOG PERFORMANCE STATUS:  1 - Symptomatic but completely ambulatory  REVIEW OF SYSTEMS: Patient denies any weight loss, fatigue, weakness, fever, chills or night sweats. Patient denies any loss of vision, blurred vision. Patient denies any ringing  of the ears or hearing loss. No irregular heartbeat. Patient denies heart murmur or history of fainting. Patient denies any chest pain or pain radiating to her upper extremities. Patient denies any shortness of breath, difficulty breathing at night, cough or hemoptysis. Patient denies any swelling in the lower legs. Patient denies any nausea vomiting, vomiting of blood, or coffee ground material in the vomitus. Patient denies any stomach pain. Patient states has had normal bowel movements no significant  constipation or diarrhea. Patient denies any dysuria, hematuria or significant nocturia. Patient denies any problems walking, swelling in the joints or loss of balance. Patient denies any skin changes, loss of hair or loss of weight. Patient denies any excessive worrying or anxiety or significant depression. Patient denies any problems with insomnia. Patient denies excessive thirst, polyuria, polydipsia. Patient denies any swollen glands, patient denies easy bruising or easy bleeding. Patient denies any recent infections, allergies or URI. Patient "s visual fields have not changed significantly in recent time.   PHYSICAL EXAM: BP 124/79   Pulse (!) 108   Temp 98.5 F (36.9 C)   Resp 16   Ht 5\' 11"  (1.803 m)   BMI 20.08 kg/m  Patient is limited motion of her neck.  No evidence of focal neurologic deficits.  Motor and sensory levels are equal and symmetric in upper lower extremities.  She is wheelchair-bound frail.  Well-developed well-nourished patient in NAD. HEENT reveals PERLA, EOMI, discs not visualized.  Oral cavity is clear. No oral mucosal lesions are identified. Neck is  clear without evidence of cervical or supraclavicular adenopathy. Lungs are clear to A&P. Cardiac examination is essentially unremarkable with regular rate and rhythm without murmur rub or thrill. Abdomen is benign with no organomegaly or masses noted. Motor sensory and DTR levels are equal and symmetric in the upper and lower extremities. Cranial nerves II through XII are grossly intact. Proprioception is intact. No peripheral adenopathy or edema is identified. No motor or sensory levels are noted. Crude visual fields are within normal range.  LABORATORY DATA: Pathology report reviewed    RADIOLOGY RESULTS: CT scans reviewed   IMPRESSION: Widespread metastatic disease and patient with known stage IV breast cancer with progressive disease in her cervical spine in 62 year old female  PLAN: At this time I recommended 30 Gray  in 10 fractions.  I have set her up for simulation early next week and will have her first 2 treatments delivered by the end of next week.  Risks and benefits of treatment including possible increased dysphagia skin reaction fatigue were reviewed with the patient.  Her sister is also a power of medical attorney is requested psych psychological assessment and will try to arrange that.  I am also setting up to see symptom management to help with future problems and medication management.  Patient comprehend my recommendations well.  I would like to take this opportunity to thank you for allowing me to participate in the care of your patient.Carmina Miller, MD

## 2022-10-07 NOTE — Telephone Encounter (Signed)
Received call from patient reporting swallowing problems  Kathryn Lucas chex Water behind to push  Unable to drink Ensure Unable to do yogurt  Apple juice

## 2022-10-08 ENCOUNTER — Encounter: Payer: Self-pay | Admitting: Hematology and Oncology

## 2022-10-08 ENCOUNTER — Other Ambulatory Visit (HOSPITAL_COMMUNITY): Payer: Self-pay

## 2022-10-08 ENCOUNTER — Inpatient Hospital Stay: Payer: Medicaid Other | Admitting: Internal Medicine

## 2022-10-08 ENCOUNTER — Encounter: Payer: Self-pay | Admitting: Nurse Practitioner

## 2022-10-09 ENCOUNTER — Telehealth: Payer: Self-pay

## 2022-10-09 ENCOUNTER — Other Ambulatory Visit (HOSPITAL_COMMUNITY): Payer: Self-pay

## 2022-10-09 ENCOUNTER — Other Ambulatory Visit: Payer: Self-pay

## 2022-10-09 DIAGNOSIS — Z170 Estrogen receptor positive status [ER+]: Secondary | ICD-10-CM

## 2022-10-09 DIAGNOSIS — R1310 Dysphagia, unspecified: Secondary | ICD-10-CM

## 2022-10-09 NOTE — Telephone Encounter (Signed)
Pt called and wanted to verify appts coming up. Lab 7/30 and MD telephone visit 7/31.

## 2022-10-11 NOTE — Progress Notes (Signed)
Patient Care Team: Ollen Bowl, MD as PCP - General (Internal Medicine) [name], MD as Consulting Physician (Hematology and Oncology) Almond Lint, MD as Consulting Physician (General Surgery) Dorothy Puffer, MD as Consulting Physician (Radiation Oncology)  DIAGNOSIS:  Encounter Diagnosis  Name Primary?   Malignant neoplasm of lower-outer quadrant of left breast of female, estrogen receptor positive (HCC) Yes    SUMMARY OF ONCOLOGIC HISTORY: Oncology History  Malignant neoplasm of lower-outer quadrant of left breast of female, estrogen receptor positive (HCC)  06/11/2016 Mammogram   Palpable left breast masses 3:00 position: 2.2 cm; 5:30 position: 2.5 cm; 6:30 position: 0.7 cm   06/19/2016 Initial Diagnosis   Left breast biopsy 3:30: IDC with DCIS grade 1, ER 90%, PR 50%, Ki-67 15%, HER-2 negative ratio 1.13; biopsy 5:30 position: IDC grade 1   07/13/2016 Breast MRI   Large area of abnormal enhancement lower inner and lower outer quadrants left breast spanning 9 cm x 6.4 cm x 5.3 cm, no abnormal enlarged lymph nodes; T3 N0 stage II a (New AJCC staging)    07/15/2016 - 12/08/2017 Anti-estrogen oral therapy   Neoadjuvant anastrozole 1 mg daily   07/17/2016 Oncotype testing   Testing done on the biopsy: Oncotype DX score 22, intermediate risk   02/02/2017 Breast MRI   Left breast multicentric disease unchanged measuring 2.7 x 1.6 cm.  Mass in the non-mass enhancement are also not significantly changed measuring 6.2 x 2.4 cm. new enhancing mass within the outer right breast 7 mm which could be fat necrosis or inclusion cyst    02/09/2017 Imaging   Ultrasound of the right breast lesion noted on MRI: No sonographic finding corresponds to the abnormality noted on MRI   07/13/2017 Cancer Staging   Staging form: Breast, AJCC 8th Edition - Clinical stage from 07/13/2017: Stage IIA (cT3, cN0, cM0, G1, ER+, PR+, HER2-) - Signed by Loa Socks, NP on 05/18/2018   10/26/2017  Surgery   Left mastectomy: IDC grade 1, 2 foci largest spans 8.5 cm, intermediate grade DCIS, lymphovascular invasion identified, perineural invasion identified, 1/2 lymph nodes positive with extracapsular extension, ER 9200%, PR 5 to 50%, HER-2 negative, Ki-67 10 to 15%, T3N1A Mammaprint: low risk   11/02/2017 Cancer Staging   Staging form: Breast, AJCC 8th Edition - Pathologic: No Stage Recommended (ypT3, pN1a, cM0, G1, ER+, PR+, HER2-) - Signed by [name], MD on 11/02/2017   12/08/2017 - 01/26/2018 Radiation Therapy   Adjuvant radiation therapy    02/2018 -  Anti-estrogen oral therapy   Anastrozole 1 mg daily adjuvant therapy   10/21/2022 -  Chemotherapy   Patient is on Treatment Plan : BREAST METASTATIC Fam-Trastuzumab Deruxtecan-nxki (Enhertu) (5.4) q21d       CHIEF COMPLIANT: Complaining of difficulty with swallowing as well as hoarseness of voice, generalized fatigue and weakness  INTERVAL HISTORY: Kathryn Lucas is a  62 y.o. with above-mentioned history of metastatic breast cancer who is currently on Faslodex with Ibrance. She presents to the clinic for a follow-up.  She was in the hospital with symptoms of CVA which resolved after a brief period of time.  She underwent a CT of the neck and is here today to discuss results.  She has been having difficulty with swallowing and has not been eating or drinking much.   ALLERGIES:  is allergic to percocet [oxycodone-acetaminophen].  MEDICATIONS:  Current Outpatient Medications  Medication Sig Dispense Refill   anastrozole (ARIMIDEX) 1 MG tablet Take 1 tablet (1 mg  total) by mouth daily. 90 tablet 3   azelastine (ASTELIN) 0.1 % nasal spray Place into both nostrils 2 (two) times daily.     Calcium 500-100 MG-UNIT CHEW Chew 1 tablet by mouth daily. 60 tablet    cetirizine (ZYRTEC) 10 MG tablet Take by mouth.     cholecalciferol (VITAMIN D3) 25 MCG (1000 UNIT) tablet Take 1 tablet (1,000 Units total) by mouth daily.      dexamethasone (DECADRON) 4 MG tablet Take 1 tab for 2 days after chemo 30 tablet 1   famotidine (PEPCID) 20 MG tablet Take 20 mg by mouth 2 (two) times daily.     fluticasone (FLONASE) 50 MCG/ACT nasal spray Place into the nose.     gabapentin (NEURONTIN) 300 MG capsule Take 300 mg by mouth 3 (three) times daily.     levothyroxine (SYNTHROID) 112 MCG tablet Take 1 tablet by mouth daily.     levothyroxine (SYNTHROID, LEVOTHROID) 175 MCG tablet Take 112 mcg by mouth.      LORazepam (ATIVAN) 0.5 MG tablet Take 1 tablet (0.5 mg total) by mouth at bedtime. 30 tablet 0   omeprazole (PRILOSEC) 40 MG capsule Take by mouth.     ondansetron (ZOFRAN) 8 MG tablet Take 1 tablet (8 mg total) by mouth every 8 (eight) hours as needed for nausea. 30 tablet 3   ondansetron (ZOFRAN) 8 MG tablet Take 1 tablet (8 mg total) by mouth every 8 (eight) hours as needed for nausea or vomiting. Start on the third day after chemotherapy. 30 tablet 1   palbociclib (IBRANCE) 75 MG tablet Take 1 tablet (75 mg total) by mouth daily. Take for 21 days. (As directed by MD) (Patient not taking: Reported on 09/24/2022) 21 tablet 0   pantoprazole (PROTONIX) 40 MG tablet Take by mouth.     prochlorperazine (COMPAZINE) 10 MG tablet Take 1 tablet (10 mg total) by mouth every 6 (six) hours as needed for nausea or vomiting. 30 tablet 1   vitamin C (ASCORBIC ACID) 250 MG tablet Take 1 tablet (250 mg total) by mouth daily.     zinc gluconate 50 MG tablet Take 1 tablet (50 mg total) by mouth daily.     No current facility-administered medications for this visit.    PHYSICAL EXAMINATION: ECOG PERFORMANCE STATUS: 1 - Symptomatic but completely ambulatory  Vitals:   10/14/22 1346  BP: 125/89  Pulse: (!) 110  Resp: 18  Temp: 97.9 F (36.6 C)  SpO2: 96%   Filed Weights   10/14/22 1346  Weight: 136 lb (61.7 kg)      LABORATORY DATA:  I have reviewed the data as listed    Latest Ref Rng & Units 10/13/2022   10:47 AM 09/28/2022     2:33 PM 09/14/2022    3:02 PM  CMP  Glucose 70 - 99 mg/dL 253  97  664   BUN 8 - 23 mg/dL 16  12  25    Creatinine 0.44 - 1.00 mg/dL 4.03  4.74  2.59   Sodium 135 - 145 mmol/L 129  136  132   Potassium 3.5 - 5.1 mmol/L 3.8  4.0  4.0   Chloride 98 - 111 mmol/L 99  103  100   CO2 22 - 32 mmol/L 19  26  22    Calcium 8.9 - 10.3 mg/dL 8.2  8.7  9.0   Total Protein 6.5 - 8.1 g/dL 6.6  6.1  7.5   Total Bilirubin 0.3 - 1.2 mg/dL 1.2  0.4  0.4   Alkaline Phos 38 - 126 U/L 92  82  77   AST 15 - 41 U/L 189  126  309   ALT 0 - 44 U/L 38  28  30     Lab Results  Component Value Date   WBC 3.2 (L) 10/13/2022   HGB 12.1 10/13/2022   HCT 34.3 (L) 10/13/2022   MCV 100.9 (H) 10/13/2022   PLT 165 10/13/2022   NEUTROABS 2.6 10/13/2022    ASSESSMENT & PLAN:  Malignant neoplasm of lower-outer quadrant of left breast of female, estrogen receptor positive (HCC) 06/19/2016 Left breast biopsy 3:30: IDC with DCIS grade 1, ER 90%, PR 50%, Ki-67 15%, HER-2 negative ratio 1.13; biopsy 5:30 position: IDC grade 1   10/26/17: Left mastectomy: IDC grade 1, 2 foci largest spans 8.5 cm, intermediate grade DCIS, lymphovascular invasion identified, perineural invasion identified, 1/2 lymph nodes positive with extracapsular extension, ER 9200%, PR 5 to 50%, HER-2 negative, Ki-67 10 to 15%, T3N1A   Oncotype DX score 22, intermediate risk, chemotherapy not felt to have significant benefit.   Treatment Summary: 1. Antiestrogen therapy with anastrozole 1 mg daily started 07/15/2016 2. Mastectomy 10/26/2017, Mammaprint low risk luminal type A 3. Followed by adjuvant radiation 12/08/17- 01/26/18  4. Followed by adjuvant antiestrogen therapy anastrozole started 01/17/2018 (originally started 07/15/2016) -------------------------------------------------------------------- Low back pain August 2023: Underwent CT myelogram: Large expansile lesion in the sacrum with extraosseous extension of the tumor, diffuse lytic lesions  throughout the visualized spine with metastatic disease myeloma is considered less likely.  (This was ordered by Dr. Marcene Corning)   Treatment plan: 1.  PET CT scan 12/27/2021: Widespread bone metastatic disease largest lesion involving the sacrum with pathological fractures of T6 and T10 retroperitoneal and pelvic lymph node metastasis, right axillary lymph node, activity in the pancreatic head, hypermetabolic activity in the adrenal glands, right thyroid nodule. 2. biopsy of sacrum: 12/26/2021: Metastatic breast cancer, ER 90%, PR 10%, HER2 negative (0) 3.  Treatment plan: Ibrance along with Faslodex started 12/25/2021 4.  Xgeva for bone metastases.  Every 3 months 5.  Palliative radiation to the sacrum completed 01/19/2022 Genetic testing for BRCA analysis. -------------------------------------------------------------------------------------------------------------------------------------- Current treatment: Ibrance with Faslodex and Zometa, started 12/25/2021 Toxicities: Leukopenia: Currently on Ibrance 75 mg.   Fatigue Alternating constipation and diarrhea Elevated AST 309: Ibrance and anastrozole held for 2 weeks.  AST improved to 126, AST 189 after resuming treatment  Skin punch biopsy 07/23/2022: Breast cancer ER 95%, PR 0%, HER2 2+ by IHC, FISH negative ratio 1.1 (with HER2 being 2+ positive she would be eligible for Enhertu)   10/01/2022: CT neck: Extensive osseous metastatic disease throughout spine, extensive involvement of bilateral C1 lateral masses which are pathologically collapsed.  Soft tissue extension from C1, C5 and C6 which may affect the exiting nerve roots, T1, C3 retropulsion  Recommendation: Palliative radiation to the cervical spine: Patient will see Dr. Aggie Cosier at [facility] Cutaneous metastases and progression: I recommended stopping Ibrance and switching her to Enhertu Plan to do PET CT scan in 2 weeks Dysphagia/dysphonia: Will request gastroenterology consultation for  PEG tube placement as well as to evaluate the esophagus for any stricture.  Follow-up in 1 week to start Enhertu.   Orders Placed This Encounter  Procedures   Consent Attestation for Oncology Treatment    Order Specific Question:   The patient is informed of risks, benefits, side-effects of the prescribed oncology treatment. Potential short term and long term side effects and  response rates discussed. After a long discussion, the patient made informed decision to proceed.    Answer:   Yes   CBC with Differential (Cancer Center Only)    Standing Status:   Future    Standing Expiration Date:   10/21/2023   CMP (Cancer Center only)    Standing Status:   Future    Standing Expiration Date:   10/21/2023   CBC with Differential (Cancer Center Only)    Standing Status:   Future    Standing Expiration Date:   11/11/2023   CMP (Cancer Center only)    Standing Status:   Future    Standing Expiration Date:   11/11/2023   CBC with Differential (Cancer Center Only)    Standing Status:   Future    Standing Expiration Date:   12/02/2023   CMP (Cancer Center only)    Standing Status:   Future    Standing Expiration Date:   12/02/2023   CBC with Differential (Cancer Center Only)    Standing Status:   Future    Standing Expiration Date:   12/23/2023   CMP (Cancer Center only)    Standing Status:   Future    Standing Expiration Date:   12/23/2023   CBC with Differential (Cancer Center Only)    Standing Status:   Future    Standing Expiration Date:   01/13/2024   CMP (Cancer Center only)    Standing Status:   Future    Standing Expiration Date:   01/13/2024   CBC with Differential (Cancer Center Only)    Standing Status:   Future    Standing Expiration Date:   02/03/2024   CMP (Cancer Center only)    Standing Status:   Future    Standing Expiration Date:   02/03/2024   PHYSICIAN COMMUNICATION ORDER    Please obtain Echo/Muga at baseline and every 3 months during Enhertu treatment.   The patient  has a good understanding of the overall plan. she agrees with it. she will call with any problems that may develop before the next visit here. Total time spent: 45 mins including face to face time and time spent for planning, charting and co-ordination of care   Tamsen Meek, MD 10/14/22    I Janan Ridge am acting as a Neurosurgeon for [employer]  I have reviewed the above documentation for accuracy and completeness, and I agree with the above.

## 2022-10-12 ENCOUNTER — Telehealth: Payer: Self-pay

## 2022-10-12 ENCOUNTER — Ambulatory Visit: Payer: Medicaid Other | Admitting: Physical Therapy

## 2022-10-12 NOTE — Telephone Encounter (Signed)
Pt called and verbalized need for changing lab appt d/t having CT simulation at [facility].   Called pt and s/w her regarding this. She states she will need a lab appt at [facility] d/t transportation. She also verbalizes concerns regarding inability to swallow foods. She asks if she is appropriate for feeding tube, especially for when she starts rxt. Advised pt this would be something to discuss with Dr Pamelia Hoit at her visit Wednesday. She verbalized agreement and understanding

## 2022-10-13 ENCOUNTER — Inpatient Hospital Stay: Payer: Medicaid Other

## 2022-10-13 ENCOUNTER — Other Ambulatory Visit: Payer: Medicaid Other

## 2022-10-13 ENCOUNTER — Ambulatory Visit
Admission: RE | Admit: 2022-10-13 | Discharge: 2022-10-13 | Disposition: A | Discharge status: Home / Self Care | Payer: Medicaid Other | Source: Ambulatory Visit | Attending: Radiation Oncology | Admitting: Radiation Oncology

## 2022-10-13 ENCOUNTER — Telehealth: Payer: Self-pay

## 2022-10-13 DIAGNOSIS — R1310 Dysphagia, unspecified: Secondary | ICD-10-CM

## 2022-10-13 DIAGNOSIS — Z5111 Encounter for antineoplastic chemotherapy: Secondary | ICD-10-CM | POA: Diagnosis not present

## 2022-10-13 DIAGNOSIS — Z170 Estrogen receptor positive status [ER+]: Secondary | ICD-10-CM

## 2022-10-13 LAB — CMP (CANCER CENTER ONLY)
ALT: 38 U/L (ref 0–44)
AST: 189 U/L (ref 15–41)
Albumin: 3.3 g/dL — ABNORMAL LOW (ref 3.5–5.0)
Alkaline Phosphatase: 92 U/L (ref 38–126)
Anion gap: 11 (ref 5–15)
BUN: 16 mg/dL (ref 8–23)
CO2: 19 mmol/L — ABNORMAL LOW (ref 22–32)
Calcium: 8.2 mg/dL — ABNORMAL LOW (ref 8.9–10.3)
Chloride: 99 mmol/L (ref 98–111)
Creatinine: 1.04 mg/dL — ABNORMAL HIGH (ref 0.44–1.00)
GFR, Estimated: >60 mL/min (ref >60)
Glucose, Bld: 131 mg/dL — ABNORMAL HIGH (ref 70–99)
Potassium: 3.8 mmol/L (ref 3.5–5.1)
Sodium: 129 mmol/L — ABNORMAL LOW (ref 135–145)
Total Bilirubin: 1.2 mg/dL (ref 0.3–1.2)
Total Protein: 6.6 g/dL (ref 6.5–8.1)

## 2022-10-13 LAB — CBC WITH DIFFERENTIAL (CANCER CENTER ONLY)
Abs Immature Granulocytes: 0.02 10*3/uL (ref 0.00–0.07)
Basophils Absolute: 0 10*3/uL (ref 0.0–0.1)
Basophils Relative: 1 %
Eosinophils Absolute: 0 10*3/uL (ref 0.0–0.5)
Eosinophils Relative: 0 %
HCT: 34.3 % — ABNORMAL LOW (ref 36.0–46.0)
Hemoglobin: 12.1 g/dL (ref 12.0–15.0)
Immature Granulocytes: 1 %
Lymphocytes Relative: 13 %
Lymphs Abs: 0.4 10*3/uL — ABNORMAL LOW (ref 0.7–4.0)
MCH: 35.6 pg — ABNORMAL HIGH (ref 26.0–34.0)
MCHC: 35.3 g/dL (ref 30.0–36.0)
MCV: 100.9 fL — ABNORMAL HIGH (ref 80.0–100.0)
Monocytes Absolute: 0.2 10*3/uL (ref 0.1–1.0)
Monocytes Relative: 6 %
Neutro Abs: 2.6 10*3/uL (ref 1.7–7.7)
Neutrophils Relative %: 79 %
Platelet Count: 165 10*3/uL (ref 150–400)
RBC: 3.4 MIL/uL — ABNORMAL LOW (ref 3.87–5.11)
RDW: 14.8 % (ref 11.5–15.5)
WBC Count: 3.2 10*3/uL — ABNORMAL LOW (ref 4.0–10.5)
nRBC: 0 % (ref 0.0–0.2)

## 2022-10-13 LAB — TSH: TSH: 1.98 u[IU]/mL (ref 0.350–4.500)

## 2022-10-13 NOTE — Progress Notes (Signed)
CRITICAL VALUE STICKER  CRITICAL VALUE: AST [id number]   RECEIVER (on-site recipient of call): Vernona Rieger  DATE & TIME NOTIFIED: 10/13/22 1144  MESSENGER (representative from lab): Shanda Bumps  MD NOTIFIED: yes  TIME OF NOTIFICATION: 1145  RESPONSE: Will see pt tomorrow

## 2022-10-13 NOTE — Telephone Encounter (Signed)
Pt called to request labs be faxed over to Dr Jacqulyn Bath at [place]. Labs successfully faxed to [phone]. Confirmation received.

## 2022-10-14 ENCOUNTER — Other Ambulatory Visit: Payer: Self-pay | Admitting: *Deleted

## 2022-10-14 ENCOUNTER — Ambulatory Visit: Payer: Medicaid Other | Admitting: Physical Therapy

## 2022-10-14 ENCOUNTER — Telehealth: Payer: Self-pay

## 2022-10-14 ENCOUNTER — Other Ambulatory Visit (HOSPITAL_COMMUNITY): Payer: Self-pay

## 2022-10-14 ENCOUNTER — Encounter: Payer: Self-pay | Admitting: Hematology and Oncology

## 2022-10-14 ENCOUNTER — Inpatient Hospital Stay: Payer: Medicaid Other | Admitting: Hematology and Oncology

## 2022-10-14 VITALS — BP 125/89 | HR 110 | Temp 97.9°F | Resp 18 | Ht 71.0 in | Wt 136.0 lb

## 2022-10-14 DIAGNOSIS — R634 Abnormal weight loss: Secondary | ICD-10-CM | POA: Insufficient documentation

## 2022-10-14 DIAGNOSIS — C50512 Malignant neoplasm of lower-outer quadrant of left female breast: Secondary | ICD-10-CM | POA: Diagnosis not present

## 2022-10-14 DIAGNOSIS — Z170 Estrogen receptor positive status [ER+]: Secondary | ICD-10-CM | POA: Diagnosis not present

## 2022-10-14 DIAGNOSIS — C7951 Secondary malignant neoplasm of bone: Secondary | ICD-10-CM | POA: Insufficient documentation

## 2022-10-14 DIAGNOSIS — R748 Abnormal levels of other serum enzymes: Secondary | ICD-10-CM | POA: Insufficient documentation

## 2022-10-14 DIAGNOSIS — Z5111 Encounter for antineoplastic chemotherapy: Secondary | ICD-10-CM | POA: Diagnosis not present

## 2022-10-14 DIAGNOSIS — G893 Neoplasm related pain (acute) (chronic): Secondary | ICD-10-CM

## 2022-10-14 MED ORDER — DEXAMETHASONE 4 MG PO TABS
ORAL_TABLET | ORAL | 1 refills | Status: DC
Start: 2022-10-14 — End: 2022-10-21

## 2022-10-14 MED ORDER — ONDANSETRON HCL 8 MG PO TABS
8.0000 mg | ORAL_TABLET | Freq: Three times a day (TID) | ORAL | 1 refills | Status: DC | PRN
Start: 2022-10-14 — End: 2022-10-21

## 2022-10-14 MED ORDER — PROCHLORPERAZINE MALEATE 10 MG PO TABS
10.0000 mg | ORAL_TABLET | Freq: Four times a day (QID) | ORAL | 1 refills | Status: DC | PRN
Start: 2022-10-14 — End: 2022-10-21

## 2022-10-14 NOTE — Progress Notes (Signed)
START ON PATHWAY REGIMEN - Breast     A cycle is every 21 days:     Fam-trastuzumab deruxtecan-nxki   **Always confirm dose/schedule in your pharmacy ordering system**  Patient Characteristics: Distant Metastases or Locoregional Recurrent Disease - Unresected, M0 or Locally Advanced Unresectable Disease Progressing after Neoadjuvant and Local Therapies, M0, HER2 Low/Negative, ER Positive, Chemotherapy, HER2 Low, Second Line, gBRCA Wildtype or  Not a Candidate for Molecular Targeted Therapy Therapeutic Status: Distant Metastases HER2 Status: Low ER Status: Positive (+) PR Status: Positive (+) Therapy Approach Indicated: Standard Chemotherapy/Endocrine Therapy Line of Therapy: Second Line Intent of Therapy: Non-Curative / Palliative Intent, Discussed with Patient 

## 2022-10-14 NOTE — Assessment & Plan Note (Signed)
06/19/2016 Left breast biopsy 3:30: IDC with DCIS grade 1, ER 90%, PR 50%, Ki-67 15%, HER-2 negative ratio 1.13; biopsy 5:30 position: IDC grade 1   10/26/17: Left mastectomy: IDC grade 1, 2 foci largest spans 8.5 cm, intermediate grade DCIS, lymphovascular invasion identified, perineural invasion identified, 1/2 lymph nodes positive with extracapsular extension, ER 9200%, PR 5 to 50%, HER-2 negative, Ki-67 10 to 15%, T3N1A   Oncotype DX score 22, intermediate risk, chemotherapy not felt to have significant benefit.   Treatment Summary: 1. Antiestrogen therapy with anastrozole 1 mg daily started 07/15/2016 2. Mastectomy 10/26/2017, Mammaprint low risk luminal type A 3. Followed by adjuvant radiation 12/08/17- 01/26/18  4. Followed by adjuvant antiestrogen therapy anastrozole started 01/17/2018 (originally started 07/15/2016) -------------------------------------------------------------------- Low back pain August 2023: Underwent CT myelogram: Large expansile lesion in the sacrum with extraosseous extension of the tumor, diffuse lytic lesions throughout the visualized spine with metastatic disease myeloma is considered less likely.  (This was ordered by Dr. Marcene Corning)   Treatment plan: 1.  PET CT scan 12/27/2021: Widespread bone metastatic disease largest lesion involving the sacrum with pathological fractures of T6 and T10 retroperitoneal and pelvic lymph node metastasis, right axillary lymph node, activity in the pancreatic head, hypermetabolic activity in the adrenal glands, right thyroid nodule. 2. biopsy of sacrum: 12/26/2021: Metastatic breast cancer, ER 90%, PR 10%, HER2 negative (0) 3.  Treatment plan: Ibrance along with Faslodex started 12/25/2021 4.  Xgeva for bone metastases.  Every 3 months 5.  Palliative radiation to the sacrum completed 01/19/2022 Genetic testing for BRCA  analysis. -------------------------------------------------------------------------------------------------------------------------------------- Current treatment: Ibrance with Faslodex and Zometa, started 12/25/2021 Toxicities: Leukopenia: Currently on Ibrance 75 mg.   Fatigue Alternating constipation and diarrhea Elevated AST 309: Ibrance and anastrozole held for 2 weeks.  AST improved to 126, AST 189 after resuming treatment  Skin punch biopsy 07/23/2022: Breast cancer ER 95%, PR 0%, HER2 2+ by IHC, FISH negative ratio 1.1 (with HER2 being 2+ positive she would be eligible for Enhertu)   10/01/2022: CT neck: Extensive osseous metastatic disease throughout spine, extensive involvement of bilateral C1 lateral masses which are pathologically collapsed.  Soft tissue extension from C1, C5 and C6 which may affect the exiting nerve roots, T1, C3 retropulsion  Recommendation: Palliative radiation to the cervical spine Change Ibrance to every other day Plan to do PET CT scan in 3 weeks Dysphagia/dysphonia

## 2022-10-14 NOTE — Telephone Encounter (Signed)
Called Pt and LVM regarding upcoming appts:  ECHO scheduled with [facility] 10/16/22 at 1445.  PET scheduled for 10/23/22 at 1230 at [facility] with NPO except water 6 hrs prior.  Kathryn Lucas will be in touch to schedule Gtube placement with [facility].  Gave call back number with any questions.

## 2022-10-14 NOTE — Progress Notes (Signed)
pe

## 2022-10-15 ENCOUNTER — Other Ambulatory Visit: Payer: Self-pay

## 2022-10-15 ENCOUNTER — Ambulatory Visit
Admission: RE | Admit: 2022-10-15 | Discharge: 2022-10-15 | Disposition: A | Discharge status: Home / Self Care | Payer: Medicaid Other | Source: Ambulatory Visit | Attending: Radiation Oncology | Admitting: Radiation Oncology

## 2022-10-15 ENCOUNTER — Other Ambulatory Visit: Payer: Self-pay | Admitting: *Deleted

## 2022-10-15 ENCOUNTER — Other Ambulatory Visit: Payer: Self-pay | Admitting: Hematology and Oncology

## 2022-10-15 DIAGNOSIS — C7951 Secondary malignant neoplasm of bone: Secondary | ICD-10-CM | POA: Diagnosis present

## 2022-10-15 DIAGNOSIS — C7931 Secondary malignant neoplasm of brain: Secondary | ICD-10-CM | POA: Diagnosis not present

## 2022-10-15 DIAGNOSIS — Z79811 Long term (current) use of aromatase inhibitors: Secondary | ICD-10-CM | POA: Insufficient documentation

## 2022-10-15 DIAGNOSIS — Z170 Estrogen receptor positive status [ER+]: Secondary | ICD-10-CM | POA: Insufficient documentation

## 2022-10-15 DIAGNOSIS — Z5111 Encounter for antineoplastic chemotherapy: Secondary | ICD-10-CM | POA: Insufficient documentation

## 2022-10-15 DIAGNOSIS — C50512 Malignant neoplasm of lower-outer quadrant of left female breast: Secondary | ICD-10-CM | POA: Diagnosis not present

## 2022-10-15 DIAGNOSIS — R634 Abnormal weight loss: Secondary | ICD-10-CM

## 2022-10-15 DIAGNOSIS — R748 Abnormal levels of other serum enzymes: Secondary | ICD-10-CM

## 2022-10-15 DIAGNOSIS — G893 Neoplasm related pain (acute) (chronic): Secondary | ICD-10-CM

## 2022-10-15 DIAGNOSIS — Z9012 Acquired absence of left breast and nipple: Secondary | ICD-10-CM | POA: Diagnosis not present

## 2022-10-15 LAB — RAD ONC ARIA SESSION SUMMARY
Course Elapsed Days: 0
Plan Fractions Treated to Date: 1
Plan Prescribed Dose Per Fraction: 3 Gy
Plan Total Fractions Prescribed: 10
Plan Total Prescribed Dose: 30 Gy
Reference Point Dosage Given to Date: 3 Gy
Reference Point Session Dosage Given: 3 Gy
Session Number: 1

## 2022-10-15 NOTE — Progress Notes (Signed)
Pharmacist Chemotherapy Monitoring - Initial Assessment    Anticipated start date: 10/21/22   The following has been reviewed per standard work regarding the patient's treatment regimen: The patient's diagnosis, treatment plan and drug doses, and organ/hematologic function Lab orders and baseline tests specific to treatment regimen  The treatment plan start date, drug sequencing, and pre-medications Prior authorization status  Patient's documented medication list, including drug-drug interaction screen and prescriptions for anti-emetics and supportive care specific to the treatment regimen The drug concentrations, fluid compatibility, administration routes, and timing of the medications to be used The patient's access for treatment and lifetime cumulative dose history, if applicable  The patient's medication allergies and previous infusion related reactions, if applicable   Changes made to treatment plan:  N/A  Follow up needed:  Pending authorization for treatment  Results from 8/2 ECHO    Jerry Caras, PharmD PGY2 Oncology Pharmacy Resident

## 2022-10-16 ENCOUNTER — Ambulatory Visit: Payer: Medicaid Other

## 2022-10-16 ENCOUNTER — Other Ambulatory Visit: Payer: Self-pay

## 2022-10-16 ENCOUNTER — Ambulatory Visit
Admission: RE | Admit: 2022-10-16 | Discharge: 2022-10-16 | Disposition: A | Discharge status: Home / Self Care | Payer: Medicaid Other | Source: Ambulatory Visit | Attending: Radiation Oncology | Admitting: Radiation Oncology

## 2022-10-16 DIAGNOSIS — C7951 Secondary malignant neoplasm of bone: Secondary | ICD-10-CM | POA: Diagnosis not present

## 2022-10-16 DIAGNOSIS — R748 Abnormal levels of other serum enzymes: Secondary | ICD-10-CM

## 2022-10-16 DIAGNOSIS — G893 Neoplasm related pain (acute) (chronic): Secondary | ICD-10-CM

## 2022-10-16 DIAGNOSIS — Z170 Estrogen receptor positive status [ER+]: Secondary | ICD-10-CM

## 2022-10-16 DIAGNOSIS — C50512 Malignant neoplasm of lower-outer quadrant of left female breast: Secondary | ICD-10-CM

## 2022-10-16 DIAGNOSIS — Z0189 Encounter for other specified special examinations: Secondary | ICD-10-CM

## 2022-10-16 DIAGNOSIS — R634 Abnormal weight loss: Secondary | ICD-10-CM | POA: Diagnosis not present

## 2022-10-16 DIAGNOSIS — Z5111 Encounter for antineoplastic chemotherapy: Secondary | ICD-10-CM | POA: Diagnosis not present

## 2022-10-16 LAB — RAD ONC ARIA SESSION SUMMARY
Course Elapsed Days: 1
Plan Fractions Treated to Date: 2
Plan Prescribed Dose Per Fraction: 3 Gy
Plan Total Fractions Prescribed: 10
Plan Total Prescribed Dose: 30 Gy
Reference Point Dosage Given to Date: 6 Gy
Reference Point Session Dosage Given: 3 Gy
Session Number: 2

## 2022-10-16 LAB — ECHOCARDIOGRAM COMPLETE
AR max vel: 2.67 cm2
AV Area VTI: 2.77 cm2
AV Area mean vel: 2.58 cm2
AV Mean grad: 2.3 mmHg
AV Peak grad: 4.1 mmHg
Ao pk vel: 1.01 m/s
Area-P 1/2: 6.17 cm2
Calc EF: 46.2 %
Single Plane A2C EF: 46.8 %
Single Plane A4C EF: 51.9 %

## 2022-10-16 NOTE — Progress Notes (Signed)
Gilmer Mor, DO sent to [facility] for image guided gastrostomy. CT 09/11/22  [address]       Previous Messages

## 2022-10-19 ENCOUNTER — Ambulatory Visit: Payer: Medicaid Other | Admitting: Physical Therapy

## 2022-10-19 ENCOUNTER — Other Ambulatory Visit: Payer: Self-pay

## 2022-10-19 ENCOUNTER — Ambulatory Visit: Admission: RE | Admit: 2022-10-19 | Payer: Medicaid Other | Source: Ambulatory Visit

## 2022-10-19 DIAGNOSIS — Z5111 Encounter for antineoplastic chemotherapy: Secondary | ICD-10-CM | POA: Diagnosis not present

## 2022-10-19 LAB — RAD ONC ARIA SESSION SUMMARY
Course Elapsed Days: 4
Plan Fractions Treated to Date: 3
Plan Prescribed Dose Per Fraction: 3 Gy
Plan Total Fractions Prescribed: 10
Plan Total Prescribed Dose: 30 Gy
Reference Point Dosage Given to Date: 9 Gy
Reference Point Session Dosage Given: 3 Gy
Session Number: 3

## 2022-10-20 ENCOUNTER — Other Ambulatory Visit: Payer: Self-pay

## 2022-10-20 ENCOUNTER — Telehealth: Payer: Self-pay

## 2022-10-20 ENCOUNTER — Ambulatory Visit
Admission: RE | Admit: 2022-10-20 | Discharge: 2022-10-20 | Disposition: A | Discharge status: Home / Self Care | Payer: Medicaid Other | Source: Ambulatory Visit | Attending: Radiation Oncology | Admitting: Radiation Oncology

## 2022-10-20 DIAGNOSIS — Z5111 Encounter for antineoplastic chemotherapy: Secondary | ICD-10-CM | POA: Diagnosis not present

## 2022-10-20 LAB — RAD ONC ARIA SESSION SUMMARY
Course Elapsed Days: 5
Plan Fractions Treated to Date: 4
Plan Prescribed Dose Per Fraction: 3 Gy
Plan Total Fractions Prescribed: 10
Plan Total Prescribed Dose: 30 Gy
Reference Point Dosage Given to Date: 12 Gy
Reference Point Session Dosage Given: 3 Gy
Session Number: 4

## 2022-10-20 MED FILL — Dexamethasone Sodium Phosphate Inj 100 MG/10ML: INTRAMUSCULAR | Qty: 1 | Status: AC

## 2022-10-20 MED FILL — Fosaprepitant Dimeglumine For IV Infusion 150 MG (Base Eq): INTRAVENOUS | Qty: 5 | Status: AC

## 2022-10-20 NOTE — Telephone Encounter (Signed)
Pt called to ask if she should have labs drawn at [facility] 10/21/22 at 1045 before her RXT which is scheduled for 11 AM at [facility]. She is scheduled to see MD at [facility] 1145 and will not have time to obtain labs here before seeing MD.   Message sent to scheduling to arrange for pt to have labs drawn at [facility]. Pt aware and verbalized thanks.

## 2022-10-20 NOTE — Progress Notes (Signed)
Patient Care Team: Ollen Bowl, MD as PCP - General (Internal Medicine) [name], MD as Consulting Physician (Hematology and Oncology) Almond Lint, MD as Consulting Physician (General Surgery) Dorothy Puffer, MD as Consulting Physician (Radiation Oncology) Carmina Miller, MD as Consulting Physician (Radiation Oncology)  DIAGNOSIS:  Encounter Diagnosis  Name Primary?   Malignant neoplasm of lower-outer quadrant of left breast of female, estrogen receptor positive (HCC) Yes    SUMMARY OF ONCOLOGIC HISTORY: Oncology History  Malignant neoplasm of lower-outer quadrant of left breast of female, estrogen receptor positive (HCC)  06/11/2016 Mammogram   Palpable left breast masses 3:00 position: 2.2 cm; 5:30 position: 2.5 cm; 6:30 position: 0.7 cm   06/19/2016 Initial Diagnosis   Left breast biopsy 3:30: IDC with DCIS grade 1, ER 90%, PR 50%, Ki-67 15%, HER-2 negative ratio 1.13; biopsy 5:30 position: IDC grade 1   07/13/2016 Breast MRI   Large area of abnormal enhancement lower inner and lower outer quadrants left breast spanning 9 cm x 6.4 cm x 5.3 cm, no abnormal enlarged lymph nodes; T3 N0 stage II a (New AJCC staging)    07/15/2016 - 12/08/2017 Anti-estrogen oral therapy   Neoadjuvant anastrozole 1 mg daily   07/17/2016 Oncotype testing   Testing done on the biopsy: Oncotype DX score 22, intermediate risk   02/02/2017 Breast MRI   Left breast multicentric disease unchanged measuring 2.7 x 1.6 cm.  Mass in the non-mass enhancement are also not significantly changed measuring 6.2 x 2.4 cm. new enhancing mass within the outer right breast 7 mm which could be fat necrosis or inclusion cyst    02/09/2017 Imaging   Ultrasound of the right breast lesion noted on MRI: No sonographic finding corresponds to the abnormality noted on MRI   07/13/2017 Cancer Staging   Staging form: Breast, AJCC 8th Edition - Clinical stage from 07/13/2017: Stage IIA (cT3, cN0, cM0, G1, ER+, PR+, HER2-) -  Signed by Loa Socks, NP on 05/18/2018   10/26/2017 Surgery   Left mastectomy: IDC grade 1, 2 foci largest spans 8.5 cm, intermediate grade DCIS, lymphovascular invasion identified, perineural invasion identified, 1/2 lymph nodes positive with extracapsular extension, ER 9200%, PR 5 to 50%, HER-2 negative, Ki-67 10 to 15%, T3N1A Mammaprint: low risk   11/02/2017 Cancer Staging   Staging form: Breast, AJCC 8th Edition - Pathologic: No Stage Recommended (ypT3, pN1a, cM0, G1, ER+, PR+, HER2-) - Signed by [name], MD on 11/02/2017   12/08/2017 - 01/26/2018 Radiation Therapy   Adjuvant radiation therapy    02/2018 -  Anti-estrogen oral therapy   Anastrozole 1 mg daily adjuvant therapy   10/21/2022 -  Chemotherapy   Patient is on Treatment Plan : BREAST METASTATIC Fam-Trastuzumab Deruxtecan-nxki (Enhertu) (5.4) q21d       CHIEF COMPLIANT: Metastatic breast cancer  INTERVAL HISTORY: Kathryn Lucas is a 62 y.o. with above-mentioned history of metastatic breast cancer who is here to receive her first cycle of Enhertu.  She is also receiving palliative radiation therapy to her neck.  She reports to be feeling extremely fatigued.  She has not been able to eat much and because of that her weight has been down.  We have requested a PEG tube placement and she has not yet made that appointment.   ALLERGIES:  is allergic to percocet [oxycodone-acetaminophen].  MEDICATIONS:  Current Outpatient Medications  Medication Sig Dispense Refill   anastrozole (ARIMIDEX) 1 MG tablet Take 1 tablet (1 mg total) by mouth daily. 90 tablet  3   azelastine (ASTELIN) 0.1 % nasal spray Place into both nostrils 2 (two) times daily.     Calcium 500-100 MG-UNIT CHEW Chew 1 tablet by mouth daily. 60 tablet    cetirizine (ZYRTEC) 10 MG tablet Take by mouth.     cholecalciferol (VITAMIN D3) 25 MCG (1000 UNIT) tablet Take 1 tablet (1,000 Units total) by mouth daily.     dexamethasone (DECADRON) 4 MG tablet Take  1 tab for 2 days after chemo 30 tablet 1   famotidine (PEPCID) 20 MG tablet Take 20 mg by mouth 2 (two) times daily.     fluticasone (FLONASE) 50 MCG/ACT nasal spray Place into the nose.     gabapentin (NEURONTIN) 300 MG capsule Take 300 mg by mouth 3 (three) times daily.     levothyroxine (SYNTHROID) 112 MCG tablet Take 1 tablet by mouth daily.     levothyroxine (SYNTHROID, LEVOTHROID) 175 MCG tablet Take 112 mcg by mouth.      LORazepam (ATIVAN) 0.5 MG tablet Take 1 tablet (0.5 mg total) by mouth at bedtime. 30 tablet 0   omeprazole (PRILOSEC) 40 MG capsule Take by mouth.     ondansetron (ZOFRAN) 8 MG tablet Take 1 tablet (8 mg total) by mouth every 8 (eight) hours as needed for nausea or vomiting. Start on the third day after chemotherapy. 30 tablet 1   palbociclib (IBRANCE) 75 MG tablet Take 1 tablet (75 mg total) by mouth daily. Take for 21 days. (As directed by MD) 21 tablet 0   pantoprazole (PROTONIX) 40 MG tablet Take by mouth.     prochlorperazine (COMPAZINE) 10 MG tablet Take 1 tablet (10 mg total) by mouth every 6 (six) hours as needed for nausea or vomiting. 30 tablet 1   vitamin C (ASCORBIC ACID) 250 MG tablet Take 1 tablet (250 mg total) by mouth daily.     zinc gluconate 50 MG tablet Take 1 tablet (50 mg total) by mouth daily.     No current facility-administered medications for this visit.   Facility-Administered Medications Ordered in Other Visits  Medication Dose Route Frequency Provider Last Rate Last Admin   dexamethasone (DECADRON) 10 mg in sodium chloride 0.9 % 50 mL IVPB  10 mg Intravenous Once [name], MD 280 mL/hr at 10/21/22 1311 10 mg at 10/21/22 1311   fam-trastuzumab deruxtecan-nxki (ENHERTU) 200 mg in dextrose 5 % 100 mL chemo infusion  3.2 mg/kg (Treatment Plan Recorded) Intravenous Once [name], MD       fosaprepitant (EMEND) 150 mg in sodium chloride 0.9 % 145 mL IVPB  150 mg Intravenous Once [name], MD        PHYSICAL EXAMINATION: ECOG  PERFORMANCE STATUS: 3 - Symptomatic, >50% confined to bed  Vitals:   10/21/22 1216  BP: 109/68  Pulse: (!) 118  Resp: 18  Temp: 97.7 F (36.5 C)  SpO2: 98%   Filed Weights   10/21/22 1216  Weight: 136 lb 14.4 oz (62.1 kg)      LABORATORY DATA:  I have reviewed the data as listed    Latest Ref Rng & Units 10/21/2022   10:54 AM 10/13/2022   10:47 AM 09/28/2022    2:33 PM  CMP  Glucose 70 - 99 mg/dL 161  096  97   BUN 8 - 23 mg/dL 9  16  12    Creatinine 0.44 - 1.00 mg/dL 0.45  4.09  8.11   Sodium 135 - 145 mmol/L 130  129  136  Potassium 3.5 - 5.1 mmol/L 3.6  3.8  4.0   Chloride 98 - 111 mmol/L 101  99  103   CO2 22 - 32 mmol/L 21  19  26    Calcium 8.9 - 10.3 mg/dL 8.5  8.2  8.7   Total Protein 6.5 - 8.1 g/dL 6.4  6.6  6.1   Total Bilirubin 0.3 - 1.2 mg/dL 0.9  1.2  0.4   Alkaline Phos 38 - 126 U/L 89  92  82   AST 15 - 41 U/L 248  189  126   ALT 0 - 44 U/L 33  38  28     Lab Results  Component Value Date   WBC 1.9 (L) 10/21/2022   HGB 11.9 (L) 10/21/2022   HCT 34.5 (L) 10/21/2022   MCV 101.8 (H) 10/21/2022   PLT 77 (L) 10/21/2022   NEUTROABS 1.2 (L) 10/21/2022    ASSESSMENT & PLAN:  Malignant neoplasm of lower-outer quadrant of left breast of female, estrogen receptor positive (HCC) 10/26/17: Left mastectomy: IDC grade 1, 2 foci largest spans 8.5 cm, intermediate grade DCIS, lymphovascular invasion identified, perineural invasion identified, 1/2 lymph nodes positive with extracapsular extension, ER 9200%, PR 5 to 50%, HER-2 negative, Ki-67 10 to 15%, T3N1A   Oncotype DX score 22, intermediate risk, chemotherapy not felt to have significant benefit.   Treatment Summary: 1. Antiestrogen therapy with anastrozole 1 mg daily started 07/15/2016 2. Mastectomy 10/26/2017, Mammaprint low risk luminal type A 3. Followed by adjuvant radiation 12/08/17- 01/26/18  4. Followed by adjuvant antiestrogen therapy anastrozole started 01/17/2018 (originally started  07/15/2016) -------------------------------------------------------------------- Low back pain August 2023: Underwent CT myelogram: Large expansile lesion in the sacrum with extraosseous extension of the tumor, diffuse lytic lesions throughout the visualized spine with metastatic disease myeloma is considered less likely.  (This was ordered by Dr. Marcene Corning)   Treatment plan: 1.  PET CT scan 12/27/2021: Widespread bone metastatic disease largest lesion involving the sacrum with pathological fractures of T6 and T10 retroperitoneal and pelvic lymph node metastasis, right axillary lymph node, activity in the pancreatic head, hypermetabolic activity in the adrenal glands, right thyroid nodule. 2. biopsy of sacrum: 12/26/2021: Metastatic breast cancer, ER 90%, PR 10%, HER2 negative (0) 3.  Ibrance along with Faslodex started 12/25/2021-10/14/2022 4.  Xgeva for bone metastases.  Every 3 months 5.  Palliative radiation to the sacrum completed 01/19/2022 ---------------------------------------------------------------------------------------------------------------------- Current treatment: Enhertu cycle 1 starting on 10/21/2022 We plan to initiate this treatment irrespective of her blood counts. Elevated LFTs: I suspect that she may have liver metastases.  Scans are going to happen this Friday. Pancytopenia: Possibly bone marrow involvement. She has stopped Ibrance but her blood counts have continued to go down.  I discussed with the patient and her daughter that she is in a critical state.  If she can recover from this current episode, she probably will respond to the systemic treatment.  However if for some reason she cannot handle the treatment then it could lead to hospitalization and complications.  They understand the risks and are willing to proceed with today's treatment.   No orders of the defined types were placed in this encounter.  The patient has a good understanding of the overall plan.  she agrees with it. she will call with any problems that may develop before the next visit here. Total time spent: 30 mins including face to face time and time spent for planning, charting and co-ordination of care   Viinay K  Pamelia Hoit, MD 10/21/22    I Janan Ridge am acting as a Neurosurgeon for [employer]  I have reviewed the above documentation for accuracy and completeness, and I agree with the above.

## 2022-10-21 ENCOUNTER — Inpatient Hospital Stay: Payer: Medicaid Other

## 2022-10-21 ENCOUNTER — Inpatient Hospital Stay: Payer: Medicaid Other | Admitting: Hematology and Oncology

## 2022-10-21 ENCOUNTER — Ambulatory Visit: Payer: Medicaid Other | Admitting: Physical Therapy

## 2022-10-21 ENCOUNTER — Inpatient Hospital Stay: Payer: Medicaid Other | Attending: Hematology and Oncology

## 2022-10-21 ENCOUNTER — Ambulatory Visit
Admission: RE | Admit: 2022-10-21 | Discharge: 2022-10-21 | Disposition: A | Discharge status: Home / Self Care | Payer: Medicaid Other | Source: Ambulatory Visit | Attending: Radiation Oncology | Admitting: Radiation Oncology

## 2022-10-21 ENCOUNTER — Other Ambulatory Visit: Payer: Medicaid Other

## 2022-10-21 ENCOUNTER — Other Ambulatory Visit: Payer: Self-pay

## 2022-10-21 VITALS — BP 115/71 | HR 109 | Resp 18

## 2022-10-21 VITALS — BP 109/68 | HR 118 | Temp 97.7°F | Resp 18 | Ht 71.0 in | Wt 136.9 lb

## 2022-10-21 DIAGNOSIS — Z170 Estrogen receptor positive status [ER+]: Secondary | ICD-10-CM

## 2022-10-21 DIAGNOSIS — C50512 Malignant neoplasm of lower-outer quadrant of left female breast: Secondary | ICD-10-CM | POA: Diagnosis not present

## 2022-10-21 DIAGNOSIS — Z5111 Encounter for antineoplastic chemotherapy: Secondary | ICD-10-CM | POA: Diagnosis not present

## 2022-10-21 LAB — CBC WITH DIFFERENTIAL (CANCER CENTER ONLY)
Abs Immature Granulocytes: 0.01 10*3/uL (ref 0.00–0.07)
Basophils Absolute: 0 10*3/uL (ref 0.0–0.1)
Basophils Relative: 1 %
Eosinophils Absolute: 0 10*3/uL (ref 0.0–0.5)
Eosinophils Relative: 0 %
HCT: 34.5 % — ABNORMAL LOW (ref 36.0–46.0)
Hemoglobin: 11.9 g/dL — ABNORMAL LOW (ref 12.0–15.0)
Immature Granulocytes: 1 %
Lymphocytes Relative: 20 %
Lymphs Abs: 0.4 10*3/uL — ABNORMAL LOW (ref 0.7–4.0)
MCH: 35.1 pg — ABNORMAL HIGH (ref 26.0–34.0)
MCHC: 34.5 g/dL (ref 30.0–36.0)
MCV: 101.8 fL — ABNORMAL HIGH (ref 80.0–100.0)
Monocytes Absolute: 0.3 10*3/uL (ref 0.1–1.0)
Monocytes Relative: 17 %
Neutro Abs: 1.2 10*3/uL — ABNORMAL LOW (ref 1.7–7.7)
Neutrophils Relative %: 61 %
Platelet Count: 77 10*3/uL — ABNORMAL LOW (ref 150–400)
RBC: 3.39 MIL/uL — ABNORMAL LOW (ref 3.87–5.11)
RDW: 15.6 % — ABNORMAL HIGH (ref 11.5–15.5)
WBC Count: 1.9 10*3/uL — ABNORMAL LOW (ref 4.0–10.5)
nRBC: 0 % (ref 0.0–0.2)

## 2022-10-21 LAB — CMP (CANCER CENTER ONLY)
ALT: 33 U/L (ref 0–44)
AST: 248 U/L (ref 15–41)
Albumin: 3.4 g/dL — ABNORMAL LOW (ref 3.5–5.0)
Alkaline Phosphatase: 89 U/L (ref 38–126)
Anion gap: 8 (ref 5–15)
BUN: 9 mg/dL (ref 8–23)
CO2: 21 mmol/L — ABNORMAL LOW (ref 22–32)
Calcium: 8.5 mg/dL — ABNORMAL LOW (ref 8.9–10.3)
Chloride: 101 mmol/L (ref 98–111)
Creatinine: 0.74 mg/dL (ref 0.44–1.00)
GFR, Estimated: >60 mL/min (ref >60)
Glucose, Bld: 136 mg/dL — ABNORMAL HIGH (ref 70–99)
Potassium: 3.6 mmol/L (ref 3.5–5.1)
Sodium: 130 mmol/L — ABNORMAL LOW (ref 135–145)
Total Bilirubin: 0.9 mg/dL (ref 0.3–1.2)
Total Protein: 6.4 g/dL — ABNORMAL LOW (ref 6.5–8.1)

## 2022-10-21 LAB — RAD ONC ARIA SESSION SUMMARY
Course Elapsed Days: 6
Plan Fractions Treated to Date: 5
Plan Prescribed Dose Per Fraction: 3 Gy
Plan Total Fractions Prescribed: 10
Plan Total Prescribed Dose: 30 Gy
Reference Point Dosage Given to Date: 15 Gy
Reference Point Session Dosage Given: 3 Gy
Session Number: 5

## 2022-10-21 MED ORDER — ONDANSETRON HCL 8 MG PO TABS: 8.0000 mg | ORAL_TABLET | Freq: Three times a day (TID) | ORAL | 1 refills | Status: DC | PRN

## 2022-10-21 MED ORDER — SODIUM CHLORIDE 0.9 % IV SOLN
150.0000 mg | Freq: Once | INTRAVENOUS | Status: AC
  Administered 2022-10-21: 150 mg via INTRAVENOUS
  Filled 2022-10-21: qty 150

## 2022-10-21 MED ORDER — PROCHLORPERAZINE MALEATE 10 MG PO TABS: 10.0000 mg | ORAL_TABLET | Freq: Four times a day (QID) | ORAL | 1 refills | Status: DC | PRN

## 2022-10-21 MED ORDER — DEXAMETHASONE 4 MG PO TABS
ORAL_TABLET | ORAL | 1 refills | Status: DC
Start: 2022-10-21 — End: 2023-07-12

## 2022-10-21 MED ORDER — DEXTROSE 5 % IV SOLN: Freq: Once | INTRAVENOUS | Status: AC

## 2022-10-21 MED ORDER — FAM-TRASTUZUMAB DERUXTECAN-NXKI CHEMO 100 MG IV SOLR
3.2000 mg/kg | Freq: Once | INTRAVENOUS | Status: AC
  Administered 2022-10-21: 200 mg via INTRAVENOUS
  Filled 2022-10-21: qty 10

## 2022-10-21 MED ORDER — ACETAMINOPHEN 325 MG PO TABS
650.0000 mg | ORAL_TABLET | Freq: Once | ORAL | Status: AC
  Administered 2022-10-21: 650 mg via ORAL
  Filled 2022-10-21: qty 2

## 2022-10-21 MED ORDER — SODIUM CHLORIDE 0.9 % IV SOLN
10.0000 mg | Freq: Once | INTRAVENOUS | Status: AC
  Administered 2022-10-21: 10 mg via INTRAVENOUS
  Filled 2022-10-21: qty 10

## 2022-10-21 MED ORDER — PALONOSETRON HCL INJECTION 0.25 MG/5ML
0.2500 mg | Freq: Once | INTRAVENOUS | Status: AC
  Administered 2022-10-21: 0.25 mg via INTRAVENOUS
  Filled 2022-10-21: qty 5

## 2022-10-21 MED ORDER — DIPHENHYDRAMINE HCL 25 MG PO CAPS
50.0000 mg | ORAL_CAPSULE | Freq: Once | ORAL | Status: AC
  Administered 2022-10-21: 50 mg via ORAL
  Filled 2022-10-21: qty 2

## 2022-10-21 NOTE — Assessment & Plan Note (Signed)
10/26/17: Left mastectomy: IDC grade 1, 2 foci largest spans 8.5 cm, intermediate grade DCIS, lymphovascular invasion identified, perineural invasion identified, 1/2 lymph nodes positive with extracapsular extension, ER 9200%, PR 5 to 50%, HER-2 negative, Ki-67 10 to 15%, T3N1A   Oncotype DX score 22, intermediate risk, chemotherapy not felt to have significant benefit.   Treatment Summary: 1. Antiestrogen therapy with anastrozole 1 mg daily started 07/15/2016 2. Mastectomy 10/26/2017, Mammaprint low risk luminal type A 3. Followed by adjuvant radiation 12/08/17- 01/26/18  4. Followed by adjuvant antiestrogen therapy anastrozole started 01/17/2018 (originally started 07/15/2016) -------------------------------------------------------------------- Low back pain August 2023: Underwent CT myelogram: Large expansile lesion in the sacrum with extraosseous extension of the tumor, diffuse lytic lesions throughout the visualized spine with metastatic disease myeloma is considered less likely.  (This was ordered by Dr. Marcene Corning)   Treatment plan: 1.  PET CT scan 12/27/2021: Widespread bone metastatic disease largest lesion involving the sacrum with pathological fractures of T6 and T10 retroperitoneal and pelvic lymph node metastasis, right axillary lymph node, activity in the pancreatic head, hypermetabolic activity in the adrenal glands, right thyroid nodule. 2. biopsy of sacrum: 12/26/2021: Metastatic breast cancer, ER 90%, PR 10%, HER2 negative (0) 3.  Ibrance along with Faslodex started 12/25/2021-10/14/2022 4.  Xgeva for bone metastases.  Every 3 months 5.  Palliative radiation to the sacrum completed 01/19/2022 ---------------------------------------------------------------------------------------------------------------------- Current treatment: Enhertu cycle 1 starting on 10/21/2022 Return to clinic in 1 week for toxicity check in 3 weeks for follow-up with cycle 2

## 2022-10-21 NOTE — Progress Notes (Signed)
CRITICAL VALUE STICKER  CRITICAL VALUE: AST [id number]  RECEIVER (on-site recipient of call): Vernona Rieger, LPN  DATE & TIME NOTIFIED: 10/21/22 1150  MESSENGER (representative from lab): Enzo Bi  MD NOTIFIED: Yes  TIME OF NOTIFICATION: 1200  RESPONSE:  seeing pt today in office

## 2022-10-21 NOTE — Progress Notes (Signed)
Per MD,  OK to treat today with PLT 77, ANC 1.2 and AST 248. Vincenza Hews RN made aware and MD will see pt in infusion.

## 2022-10-21 NOTE — Patient Instructions (Signed)
[place] [facility]  Discharge Instructions: Thank you for choosing [facility] to provide your oncology and hematology care.   If you have a lab appointment with the Cancer Center, please go directly to the Cancer Center and check in at the registration area.   Wear comfortable clothing and clothing appropriate for easy access to any Portacath or PICC line.   We strive to give you quality time with your provider. You may need to reschedule your appointment if you arrive late (15 or more minutes).  Arriving late affects you and other patients whose appointments are after yours.  Also, if you miss three or more appointments without notifying the office, you may be dismissed from the clinic at the provider's discretion.      For prescription refill requests, have your pharmacy contact our office and allow 72 hours for refills to be completed.    Today you received the following chemotherapy and/or immunotherapy agents: Enhertu.       To help prevent nausea and vomiting after your treatment, we encourage you to take your nausea medication as directed.  BELOW ARE SYMPTOMS THAT SHOULD BE REPORTED IMMEDIATELY: *FEVER GREATER THAN 100.4 F (38 C) OR HIGHER *CHILLS OR SWEATING *NAUSEA AND VOMITING THAT IS NOT CONTROLLED WITH YOUR NAUSEA MEDICATION *UNUSUAL SHORTNESS OF BREATH *UNUSUAL BRUISING OR BLEEDING *URINARY PROBLEMS (pain or burning when urinating, or frequent urination) *BOWEL PROBLEMS (unusual diarrhea, constipation, pain near the anus) TENDERNESS IN MOUTH AND THROAT WITH OR WITHOUT PRESENCE OF ULCERS (sore throat, sores in mouth, or a toothache) UNUSUAL RASH, SWELLING OR PAIN  UNUSUAL VAGINAL DISCHARGE OR ITCHING   Items with * indicate a potential emergency and should be followed up as soon as possible or go to the Emergency Department if any problems should occur.  Please show the CHEMOTHERAPY ALERT CARD or IMMUNOTHERAPY ALERT CARD at  check-in to the Emergency Department and triage nurse.  Should you have questions after your visit or need to cancel or reschedule your appointment, please contact [place] [facility]  Dept: [phone]  and follow the prompts.  Office hours are 8:00 a.m. to 4:30 p.m. Monday - Friday. Please note that voicemails left after 4:00 p.m. may not be returned until the following business day.  We are closed weekends and major holidays. You have access to a nurse at all times for urgent questions. Please call the main number to the clinic Dept: [phone] and follow the prompts.   For any non-urgent questions, you may also contact your provider using MyChart. We now offer e-Visits for anyone 34 and older to request care online for non-urgent symptoms. For details visit [email or web address].   Also download the MyChart app! Go to the app store, search "MyChart", open the app, select [facility], and log in with your MyChart username and password.  Fam-Trastuzumab Deruxtecan Injection What is this medication? FAM-TRASTUZUMAB DERUXTECAN (fam-tras TOOZ eu mab DER ux TEE kan) treats some types of cancer. It works by blocking a protein that causes cancer cells to grow and multiply. This helps to slow or stop the spread of cancer cells. This medicine may be used for other purposes; ask your health care provider or pharmacist if you have questions. COMMON BRAND NAME(S): ENHERTU What should I tell my care team before I take this medication? They need to know if you have any of these conditions: Heart disease Heart failure Infection, especially a viral infection, such as chickenpox,  cold sores, or herpes Liver disease Lung or breathing disease, such as asthma or COPD An unusual or allergic reaction to fam-trastuzumab deruxtecan, other medications, foods, dyes, or preservatives Pregnant or trying to get pregnant Breast-feeding How should I use this medication? This medication  is injected into a vein. It is given by your care team in a hospital or clinic setting. A special MedGuide will be given to you before each treatment. Be sure to read this information carefully each time. Talk to your care team about the use of this medication in children. Special care may be needed. Overdosage: If you think you have taken too much of this medicine contact a poison control center or emergency room at once. NOTE: This medicine is only for you. Do not share this medicine with others. What if I miss a dose? It is important not to miss your dose. Call your care team if you are unable to keep an appointment. What may interact with this medication? Interactions are not expected. This list may not describe all possible interactions. Give your health care provider a list of all the medicines, herbs, non-prescription drugs, or dietary supplements you use. Also tell them if you smoke, drink alcohol, or use illegal drugs. Some items may interact with your medicine. What should I watch for while using this medication? Visit your care team for regular checks on your progress. Tell your care team if your symptoms do not start to get better or if they get worse. Your condition will be monitored carefully while you are receiving this medication. Do not become pregnant while taking this medication or for 7 months after stopping it. Women should inform their care team if they wish to become pregnant or think they might be pregnant. Men should not father a child while taking this medication and for 4 months after stopping it. There is potential for serious side effects to an unborn child. Talk to your care team for more information. Do not breast-feed an infant while taking this medication or for 7 months after the last dose. This medication has caused decreased sperm counts in some men. This may make it more difficult to father a child. Talk to your care team if you are concerned about your  fertility. This medication may increase your risk to bruise or bleed. Call your care team if you notice any unusual bleeding. Be careful brushing or flossing your teeth or using a toothpick because you may get an infection or bleed more easily. If you have any dental work done, tell your dentist you are receiving this medication. This medication may cause dry eyes and blurred vision. If you wear contact lenses, you may feel some discomfort. Lubricating eye drops may help. See your care team if the problem does not go away or is severe. This medication may increase your risk of getting an infection. Call your care team for advice if you get a fever, chills, sore throat, or other symptoms of a cold or flu. Do not treat yourself. Try to avoid being around people who are sick. Avoid taking medications that contain aspirin, acetaminophen, ibuprofen, naproxen, or ketoprofen unless instructed by your care team. These medications may hide a fever. What side effects may I notice from receiving this medication? Side effects that you should report to your care team as soon as possible: Allergic reactions--skin rash, itching, hives, swelling of the face, lips, tongue, or throat Dry cough, shortness of breath or trouble breathing Infection--fever, chills, cough, sore throat, wounds  that don't heal, pain or trouble when passing urine, general feeling of discomfort or being unwell Heart failure--shortness of breath, swelling of the ankles, feet, or hands, sudden weight gain, unusual weakness or fatigue Unusual bruising or bleeding Side effects that usually do not require medical attention (report these to your care team if they continue or are bothersome): Constipation Diarrhea Hair loss Muscle pain Nausea Vomiting This list may not describe all possible side effects. Call your doctor for medical advice about side effects. You may report side effects to FDA at 1-800-FDA-1088. Where should I keep my  medication? This medication is given in a hospital or clinic. It will not be stored at home. NOTE: This sheet is a summary. It may not cover all possible information. If you have questions about this medicine, talk to your doctor, pharmacist, or health care provider.  2024 Elsevier/Gold Standard (2020-12-17 00:00:00)

## 2022-10-22 ENCOUNTER — Telehealth: Payer: Self-pay

## 2022-10-22 ENCOUNTER — Other Ambulatory Visit: Payer: Self-pay

## 2022-10-22 ENCOUNTER — Ambulatory Visit
Admission: RE | Admit: 2022-10-22 | Discharge: 2022-10-22 | Disposition: A | Discharge status: Home / Self Care | Payer: Medicaid Other | Source: Ambulatory Visit | Attending: Radiation Oncology | Admitting: Radiation Oncology

## 2022-10-22 DIAGNOSIS — Z5111 Encounter for antineoplastic chemotherapy: Secondary | ICD-10-CM | POA: Diagnosis not present

## 2022-10-22 LAB — RAD ONC ARIA SESSION SUMMARY
Course Elapsed Days: 7
Plan Fractions Treated to Date: 6
Plan Prescribed Dose Per Fraction: 3 Gy
Plan Total Fractions Prescribed: 10
Plan Total Prescribed Dose: 30 Gy
Reference Point Dosage Given to Date: 18 Gy
Reference Point Session Dosage Given: 3 Gy
Session Number: 6

## 2022-10-22 NOTE — Telephone Encounter (Signed)
-----   Message from Nurse Threasa Beards sent at 10/21/2022  3:22 PM EDT ----- Regarding: Dr Pamelia Hoit pt, first time Enhertu Dr Pamelia Hoit pt came in 10/21/22 for first time Enhertu. Tolerated infusion well. Did not receive chemo education prior to chemo session. Received as much education as possible in infusion. Needs EDU follow up and call back.

## 2022-10-22 NOTE — Telephone Encounter (Signed)
LM for patient that this nurse was calling to see how they were doing after their treatment. Please call back to Dr. Gudena's nurse at [phone] if they have any questions or concerns regarding the treatment. 

## 2022-10-23 ENCOUNTER — Ambulatory Visit
Admission: RE | Admit: 2022-10-23 | Discharge: 2022-10-23 | Disposition: A | Discharge status: Home / Self Care | Payer: Medicaid Other | Source: Ambulatory Visit | Attending: Hematology and Oncology | Admitting: Hematology and Oncology

## 2022-10-23 ENCOUNTER — Other Ambulatory Visit: Payer: Self-pay

## 2022-10-23 ENCOUNTER — Ambulatory Visit
Admission: RE | Admit: 2022-10-23 | Discharge: 2022-10-23 | Disposition: A | Discharge status: Home / Self Care | Payer: Medicaid Other | Source: Ambulatory Visit | Attending: Radiation Oncology | Admitting: Radiation Oncology

## 2022-10-23 ENCOUNTER — Other Ambulatory Visit: Payer: Self-pay | Admitting: *Deleted

## 2022-10-23 DIAGNOSIS — C50512 Malignant neoplasm of lower-outer quadrant of left female breast: Secondary | ICD-10-CM | POA: Diagnosis not present

## 2022-10-23 DIAGNOSIS — C7951 Secondary malignant neoplasm of bone: Secondary | ICD-10-CM | POA: Diagnosis present

## 2022-10-23 DIAGNOSIS — R634 Abnormal weight loss: Secondary | ICD-10-CM | POA: Insufficient documentation

## 2022-10-23 DIAGNOSIS — Z170 Estrogen receptor positive status [ER+]: Secondary | ICD-10-CM

## 2022-10-23 DIAGNOSIS — Z5111 Encounter for antineoplastic chemotherapy: Secondary | ICD-10-CM | POA: Diagnosis not present

## 2022-10-23 DIAGNOSIS — R748 Abnormal levels of other serum enzymes: Secondary | ICD-10-CM | POA: Diagnosis not present

## 2022-10-23 DIAGNOSIS — G893 Neoplasm related pain (acute) (chronic): Secondary | ICD-10-CM

## 2022-10-23 LAB — RAD ONC ARIA SESSION SUMMARY
Course Elapsed Days: 8
Plan Fractions Treated to Date: 7
Plan Prescribed Dose Per Fraction: 3 Gy
Plan Total Fractions Prescribed: 10
Plan Total Prescribed Dose: 30 Gy
Reference Point Dosage Given to Date: 21 Gy
Reference Point Session Dosage Given: 3 Gy
Session Number: 7

## 2022-10-23 LAB — GLUCOSE, CAPILLARY: Glucose-Capillary: 94 mg/dL (ref 70–99)

## 2022-10-23 MED ORDER — SUCRALFATE 1 G PO TABS: 1.0000 g | ORAL_TABLET | Freq: Three times a day (TID) | ORAL | 1 refills | Status: DC

## 2022-10-23 MED ORDER — FLUDEOXYGLUCOSE F - 18 (FDG) INJECTION
7.0000 | Freq: Once | INTRAVENOUS | Status: AC | PRN
  Administered 2022-10-23: 6.65 via INTRAVENOUS

## 2022-10-26 ENCOUNTER — Ambulatory Visit
Admission: RE | Admit: 2022-10-26 | Discharge: 2022-10-26 | Disposition: A | Discharge status: Home / Self Care | Payer: Medicaid Other | Source: Ambulatory Visit | Attending: Radiation Oncology | Admitting: Radiation Oncology

## 2022-10-26 ENCOUNTER — Other Ambulatory Visit: Payer: Self-pay

## 2022-10-26 ENCOUNTER — Ambulatory Visit: Payer: Medicaid Other | Admitting: Physical Therapy

## 2022-10-26 DIAGNOSIS — Z5111 Encounter for antineoplastic chemotherapy: Secondary | ICD-10-CM | POA: Diagnosis not present

## 2022-10-26 LAB — RAD ONC ARIA SESSION SUMMARY
Course Elapsed Days: 11
Plan Fractions Treated to Date: 8
Plan Prescribed Dose Per Fraction: 3 Gy
Plan Total Fractions Prescribed: 10
Plan Total Prescribed Dose: 30 Gy
Reference Point Dosage Given to Date: 24 Gy
Reference Point Session Dosage Given: 3 Gy
Session Number: 8

## 2022-10-27 ENCOUNTER — Ambulatory Visit
Admission: RE | Admit: 2022-10-27 | Discharge: 2022-10-27 | Disposition: A | Discharge status: Home / Self Care | Payer: Medicaid Other | Source: Ambulatory Visit | Attending: Radiation Oncology | Admitting: Radiation Oncology

## 2022-10-27 ENCOUNTER — Other Ambulatory Visit: Payer: Self-pay

## 2022-10-27 DIAGNOSIS — Z5111 Encounter for antineoplastic chemotherapy: Secondary | ICD-10-CM | POA: Diagnosis not present

## 2022-10-27 LAB — RAD ONC ARIA SESSION SUMMARY
Course Elapsed Days: 12
Plan Fractions Treated to Date: 9
Plan Prescribed Dose Per Fraction: 3 Gy
Plan Total Fractions Prescribed: 10
Plan Total Prescribed Dose: 30 Gy
Reference Point Dosage Given to Date: 27 Gy
Reference Point Session Dosage Given: 3 Gy
Session Number: 9

## 2022-10-28 ENCOUNTER — Ambulatory Visit: Payer: Medicaid Other

## 2022-10-28 ENCOUNTER — Ambulatory Visit: Payer: Medicaid Other | Admitting: Physical Therapy

## 2022-10-28 ENCOUNTER — Telehealth: Payer: Self-pay

## 2022-10-28 NOTE — Telephone Encounter (Signed)
Pt called to let us know she skipped rxt today d/t throat soreness and the fear it would worsen with further tx. She states radiation oncologist told her today would be fine to skip.  She knows to call with any further concerns.

## 2022-10-30 ENCOUNTER — Telehealth: Payer: Self-pay | Admitting: Hematology and Oncology

## 2022-10-30 NOTE — Telephone Encounter (Signed)
Scheduled appointments per staff message. Patient is aware of the made appointments.

## 2022-11-02 ENCOUNTER — Ambulatory Visit: Payer: Medicaid Other | Admitting: Physical Therapy

## 2022-11-03 ENCOUNTER — Ambulatory Visit
Admission: RE | Admit: 2022-11-03 | Discharge: 2022-11-03 | Disposition: A | Discharge status: Home / Self Care | Payer: Medicaid Other | Source: Ambulatory Visit | Attending: Radiation Oncology | Admitting: Radiation Oncology

## 2022-11-04 ENCOUNTER — Ambulatory Visit: Payer: Medicaid Other | Admitting: Physical Therapy

## 2022-11-05 ENCOUNTER — Telehealth: Payer: Self-pay

## 2022-11-05 NOTE — Progress Notes (Deleted)
Pharmacist Chemotherapy Monitoring - Initial Assessment    Anticipated start date: 11/12/22    The following has been reviewed per standard work regarding the patient's treatment regimen: The patient's diagnosis, treatment plan and drug doses, and organ/hematologic function Lab orders and baseline tests specific to treatment regimen  The treatment plan start date, drug sequencing, and pre-medications Prior authorization status  Patient's documented medication list, including drug-drug interaction screen and prescriptions for anti-emetics and supportive care specific to the treatment regimen The drug concentrations, fluid compatibility, administration routes, and timing of the medications to be used The patient's access for treatment and lifetime cumulative dose history, if applicable  The patient's medication allergies and previous infusion related reactions, if applicable   Changes made to treatment plan:  Treatment plan date change  Follow up needed:  Orders for Hildred Alamin, RPH, BCPS, BCOP 11/05/2022  12:17 PM

## 2022-11-05 NOTE — Telephone Encounter (Signed)
Called Kathryn Lucas back to confirm she should keep her GI MD appt with [facility] for Monday. She stated she is recovering from radiation and can talk now without as much a sore throat as she had.  She believes the radiation has helped her and she completed this at [facility]. She also shared that she has a remedy for constipation that she is using consisting of prune juice and milk of magnesium. She sees [place] on 8/27 for chemo education and will have labs following so that when she sees Dr. Pamelia Hoit on 8/29, he will already have results for review. Kathryn Marek, RN

## 2022-11-06 ENCOUNTER — Other Ambulatory Visit: Payer: Self-pay | Admitting: *Deleted

## 2022-11-09 ENCOUNTER — Ambulatory Visit: Payer: Commercial Managed Care - PPO | Admitting: Physical Therapy

## 2022-11-10 ENCOUNTER — Telehealth: Payer: Self-pay | Admitting: *Deleted

## 2022-11-10 ENCOUNTER — Inpatient Hospital Stay: Payer: Medicaid Other | Admitting: Pharmacist

## 2022-11-10 ENCOUNTER — Inpatient Hospital Stay: Payer: Medicaid Other

## 2022-11-10 ENCOUNTER — Other Ambulatory Visit: Payer: Self-pay | Admitting: *Deleted

## 2022-11-10 DIAGNOSIS — C50512 Malignant neoplasm of lower-outer quadrant of left female breast: Secondary | ICD-10-CM | POA: Diagnosis present

## 2022-11-10 DIAGNOSIS — C7951 Secondary malignant neoplasm of bone: Secondary | ICD-10-CM | POA: Insufficient documentation

## 2022-11-10 DIAGNOSIS — Z170 Estrogen receptor positive status [ER+]: Secondary | ICD-10-CM | POA: Insufficient documentation

## 2022-11-10 DIAGNOSIS — Z5112 Encounter for antineoplastic immunotherapy: Secondary | ICD-10-CM | POA: Insufficient documentation

## 2022-11-10 DIAGNOSIS — Z923 Personal history of irradiation: Secondary | ICD-10-CM | POA: Insufficient documentation

## 2022-11-10 DIAGNOSIS — Z9012 Acquired absence of left breast and nipple: Secondary | ICD-10-CM | POA: Insufficient documentation

## 2022-11-10 DIAGNOSIS — Z79899 Other long term (current) drug therapy: Secondary | ICD-10-CM | POA: Insufficient documentation

## 2022-11-10 DIAGNOSIS — Z79811 Long term (current) use of aromatase inhibitors: Secondary | ICD-10-CM | POA: Insufficient documentation

## 2022-11-10 DIAGNOSIS — Z9221 Personal history of antineoplastic chemotherapy: Secondary | ICD-10-CM | POA: Diagnosis not present

## 2022-11-10 LAB — CBC WITH DIFFERENTIAL (CANCER CENTER ONLY)
Abs Immature Granulocytes: 0.04 10*3/uL (ref 0.00–0.07)
Basophils Absolute: 0 10*3/uL (ref 0.0–0.1)
Basophils Relative: 1 %
Eosinophils Absolute: 0.2 10*3/uL (ref 0.0–0.5)
Eosinophils Relative: 6 %
HCT: 31.5 % — ABNORMAL LOW (ref 36.0–46.0)
Hemoglobin: 10.9 g/dL — ABNORMAL LOW (ref 12.0–15.0)
Immature Granulocytes: 1 %
Lymphocytes Relative: 16 %
Lymphs Abs: 0.5 10*3/uL — ABNORMAL LOW (ref 0.7–4.0)
MCH: 36.2 pg — ABNORMAL HIGH (ref 26.0–34.0)
MCHC: 34.6 g/dL (ref 30.0–36.0)
MCV: 104.7 fL — ABNORMAL HIGH (ref 80.0–100.0)
Monocytes Absolute: 0.5 10*3/uL (ref 0.1–1.0)
Monocytes Relative: 16 %
Neutro Abs: 1.9 10*3/uL (ref 1.7–7.7)
Neutrophils Relative %: 60 %
Platelet Count: 293 10*3/uL (ref 150–400)
RBC: 3.01 MIL/uL — ABNORMAL LOW (ref 3.87–5.11)
RDW: 17.3 % — ABNORMAL HIGH (ref 11.5–15.5)
WBC Count: 3.2 10*3/uL — ABNORMAL LOW (ref 4.0–10.5)
nRBC: 0 % (ref 0.0–0.2)

## 2022-11-10 LAB — CMP (CANCER CENTER ONLY)
ALT: 16 U/L (ref 0–44)
AST: 63 U/L — ABNORMAL HIGH (ref 15–41)
Albumin: 3.3 g/dL — ABNORMAL LOW (ref 3.5–5.0)
Alkaline Phosphatase: 73 U/L (ref 38–126)
Anion gap: 6 (ref 5–15)
BUN: 5 mg/dL — ABNORMAL LOW (ref 8–23)
CO2: 25 mmol/L (ref 22–32)
Calcium: 8.2 mg/dL — ABNORMAL LOW (ref 8.9–10.3)
Chloride: 103 mmol/L (ref 98–111)
Creatinine: 0.48 mg/dL (ref 0.44–1.00)
GFR, Estimated: >60 mL/min (ref >60)
Glucose, Bld: 96 mg/dL (ref 70–99)
Potassium: 3.8 mmol/L (ref 3.5–5.1)
Sodium: 134 mmol/L — ABNORMAL LOW (ref 135–145)
Total Bilirubin: 0.4 mg/dL (ref 0.3–1.2)
Total Protein: 5.9 g/dL — ABNORMAL LOW (ref 6.5–8.1)

## 2022-11-10 NOTE — Progress Notes (Signed)
[name]       Telephone: [phone]?Fax: [phone]   Oncology Clinical Pharmacist Practitioner Initial Assessment  Kathryn Lucas is a 62 y.o. female with a diagnosis of breast cancer. They were contacted today via in-person visit. She had cycle 1 on 10/21/22 but we met to review potential side effects of the regimen and update her medication list.  Indication/Regimen Fam-trastuzumab deruxtecan-nxki  (Enhertu) is being used appropriately for treatment of breast cancer by Dr. [name].       Wt Readings from Last 1 Encounters:  11/03/22 131 lb 1.6 oz (59.5 kg)    Estimated body surface area is 1.73 meters squared as calculated from the following:   Height as of 10/21/22: 5\' 11"  (1.803 m).   Weight as of 11/03/22: 131 lb 1.6 oz (59.5 kg).  The dosing regimen is every 21 days until disease progression or unacceptable toxicity  Fam-trastuzumab deruxtecan-nxki (5.4 mg/kg) on Day 1  Dose Modifications Dr. Pamelia Hoit gave a reduced dose of 3.2 mg/kg for cycle 1  Allergies Allergies  Allergen Reactions   Percocet [Oxycodone-Acetaminophen] Nausea And Vomiting    Severe GI upset and vomiting    Vitals: No vitals or labs were done today for this chemotherapy education visit.  Contraindications Contraindications were reviewed? Yes Contraindications to therapy were identified? No   Safety Precautions The following safety precautions for the use of fam-trastuzumab deruxtecan-nxki were reviewed:  Fever/neutropenia: reviewed the importance of having a thermometer and the Centers for Disease Control and Prevention (CDC) definition of fever which is 100.41F (38C) or higher. Patient should call 24/7 triage at [phone] if experiencing a fever or any other symptoms Decreased hemoglobin Decreased platelet count Nausea / Vomiting Liver function test elevations Fatigue Alopecia Electrolyte abnormalities Constipation -- is having some constipation. Reviewed  dietary recommendations and to consider using Senna-S Diarrhea Pain Decreased appetite Interstitial Lung Disease (ILD): reviewed possible symptoms which include cough, shortness of breast, or fever Left Ventricular Dysfunction Handling body fluids and waste Intimacy, sexual activity, contraception, and fertility  Medication Reconciliation Current Outpatient Medications  Medication Sig Dispense Refill   dexamethasone (DECADRON) 4 MG tablet Take 1 tab for 2 days after chemo 30 tablet 1   levothyroxine (SYNTHROID) 112 MCG tablet Take 1 tablet by mouth daily.     sucralfate (CARAFATE) 1 g tablet Take 1 tablet (1 g total) by mouth 4 (four) times daily -  with meals and at bedtime. Dissolve in warm water, swish and swallow 120 tablet 1   azelastine (ASTELIN) 0.1 % nasal spray Place into both nostrils 2 (two) times daily. (Patient not taking: Reported on 11/03/2022)     Calcium 500-100 MG-UNIT CHEW Chew 1 tablet by mouth daily. (Patient not taking: Reported on 11/03/2022) 60 tablet    cetirizine (ZYRTEC) 10 MG tablet Take by mouth. (Patient not taking: Reported on 11/03/2022)     cholecalciferol (VITAMIN D3) 25 MCG (1000 UNIT) tablet Take 1 tablet (1,000 Units total) by mouth daily. (Patient not taking: Reported on 11/03/2022)     LORazepam (ATIVAN) 0.5 MG tablet Take 1 tablet (0.5 mg total) by mouth at bedtime. (Patient not taking: Reported on 11/03/2022) 30 tablet 0   ondansetron (ZOFRAN) 8 MG tablet Take 1 tablet (8 mg total) by mouth every 8 (eight) hours as needed for nausea or vomiting. Start on the third day after chemotherapy. (Patient not taking: Reported on 11/03/2022) 30 tablet 1   prochlorperazine (COMPAZINE) 10 MG tablet Take 1 tablet (  10 mg total) by mouth every 6 (six) hours as needed for nausea or vomiting. (Patient not taking: Reported on 11/03/2022) 30 tablet 1   vitamin C (ASCORBIC ACID) 250 MG tablet Take 1 tablet (250 mg total) by mouth daily. (Patient not taking: Reported on 11/03/2022)      No current facility-administered medications for this visit.   Medication reconciliation is based on the patient's most recent medication list in the electronic medical record (EMR) including herbal products and OTC medications.   The patient's medication list was reviewed today with the patient? Yes   Drug-drug interactions (DDIs) DDIs were evaluated? Yes Significant DDIs identified? No   Drug-Food Interactions Drug-food interactions were evaluated? Yes Drug-food interactions identified? No   Follow-up Plan  Treatment start date: 10/21/22 -- due for cycle 2 on 11/12/22. So far, only concern from patient is constipation. Gave her some options today while in clinic. Dr. Pamelia Hoit is watching her other toxicities.  Port placement date: She prefers peripheral line ECHO date: 10/16/22 We reviewed the prescriptions, premedications, and treatment regimen with the patient. Possible side effects of the treatment regimen were reviewed and management strategies were discussed.  Can use loperamide as needed for diarrhea and Senna-S as needed for constipation Will add denosumab Rivka Barbara) injection for 12/24/22 which will tentatively line up with chemotherapy treatment for cycle 4 per patient preference. Denosumab last given 09/16/22 and given ever 12 weeks. Clinical pharmacy will assist Dr. [name] and [name] on an as needed basis going forward  [name] participated in the discussion, expressed understanding, and voiced agreement with the above plan. All questions were answered to her satisfaction. The patient was advised to contact the clinic at [phone] with any questions or concerns prior to her return visit.   I spent 60 minutes assessing the patient.  Aveline Daus A. Odetta Pink, PharmD, BCOP, CPP  Anselm Lis, RPH-CPP, 11/10/2022 2:56 PM  **Disclaimer: This note was dictated with voice recognition software. Similar sounding words can inadvertently be transcribed and this note may  contain transcription errors which may not have been corrected upon publication of note.**

## 2022-11-10 NOTE — Telephone Encounter (Signed)
This RN placed Home Health PT referral to [facility]. At [phone].  Per contact- demos and clinical data given - will process and contact this office with plan.

## 2022-11-11 ENCOUNTER — Ambulatory Visit: Payer: Commercial Managed Care - PPO | Admitting: Physical Therapy

## 2022-11-11 MED FILL — Dexamethasone Sodium Phosphate Inj 100 MG/10ML: INTRAMUSCULAR | Qty: 1 | Status: AC

## 2022-11-11 MED FILL — Fosaprepitant Dimeglumine For IV Infusion 150 MG (Base Eq): INTRAVENOUS | Qty: 5 | Status: AC

## 2022-11-12 ENCOUNTER — Telehealth: Payer: Self-pay | Admitting: Internal Medicine

## 2022-11-12 ENCOUNTER — Inpatient Hospital Stay: Payer: Medicaid Other

## 2022-11-12 ENCOUNTER — Inpatient Hospital Stay (HOSPITAL_BASED_OUTPATIENT_CLINIC_OR_DEPARTMENT_OTHER): Payer: Medicaid Other | Admitting: Hematology and Oncology

## 2022-11-12 ENCOUNTER — Other Ambulatory Visit: Payer: Medicaid Other

## 2022-11-12 VITALS — BP 101/60 | HR 93 | Resp 18

## 2022-11-12 VITALS — BP 122/78 | HR 115 | Temp 97.9°F | Resp 18 | Ht 71.0 in | Wt 135.2 lb

## 2022-11-12 DIAGNOSIS — Z170 Estrogen receptor positive status [ER+]: Secondary | ICD-10-CM | POA: Diagnosis not present

## 2022-11-12 DIAGNOSIS — Z5112 Encounter for antineoplastic immunotherapy: Secondary | ICD-10-CM | POA: Diagnosis not present

## 2022-11-12 DIAGNOSIS — C50512 Malignant neoplasm of lower-outer quadrant of left female breast: Secondary | ICD-10-CM | POA: Diagnosis not present

## 2022-11-12 MED ORDER — ACETAMINOPHEN 325 MG PO TABS
650.0000 mg | ORAL_TABLET | Freq: Once | ORAL | Status: AC
  Administered 2022-11-12: 650 mg via ORAL
  Filled 2022-11-12: qty 2

## 2022-11-12 MED ORDER — FAM-TRASTUZUMAB DERUXTECAN-NXKI CHEMO 100 MG IV SOLR
3.2000 mg/kg | Freq: Once | INTRAVENOUS | Status: AC
  Administered 2022-11-12: 200 mg via INTRAVENOUS
  Filled 2022-11-12: qty 10

## 2022-11-12 MED ORDER — SODIUM CHLORIDE 0.9 % IV SOLN
150.0000 mg | Freq: Once | INTRAVENOUS | Status: AC
  Administered 2022-11-12: 150 mg via INTRAVENOUS
  Filled 2022-11-12: qty 150

## 2022-11-12 MED ORDER — DIPHENHYDRAMINE HCL 25 MG PO CAPS
50.0000 mg | ORAL_CAPSULE | Freq: Once | ORAL | Status: AC
  Administered 2022-11-12: 50 mg via ORAL
  Filled 2022-11-12: qty 2

## 2022-11-12 MED ORDER — SODIUM CHLORIDE 0.9 % IV SOLN
10.0000 mg | Freq: Once | INTRAVENOUS | Status: AC
  Administered 2022-11-12: 10 mg via INTRAVENOUS
  Filled 2022-11-12: qty 10

## 2022-11-12 MED ORDER — DEXTROSE 5 % IV SOLN: Freq: Once | INTRAVENOUS | Status: AC

## 2022-11-12 MED ORDER — PALONOSETRON HCL INJECTION 0.25 MG/5ML
0.2500 mg | Freq: Once | INTRAVENOUS | Status: AC
  Administered 2022-11-12: 0.25 mg via INTRAVENOUS
  Filled 2022-11-12: qty 5

## 2022-11-12 NOTE — Assessment & Plan Note (Addendum)
10/26/17: Left mastectomy: IDC grade 1, 2 foci largest spans 8.5 cm, intermediate grade DCIS, lymphovascular invasion identified, perineural invasion identified, 1/2 lymph nodes positive with extracapsular extension, ER 9200%, PR 5 to 50%, HER-2 negative, Ki-67 10 to 15%, T3N1A   Oncotype DX score 22, intermediate risk, chemotherapy not felt to have significant benefit.   Treatment Summary: 1. Antiestrogen therapy with anastrozole 1 mg daily started 07/15/2016 2. Mastectomy 10/26/2017, Mammaprint low risk luminal type A 3. Followed by adjuvant radiation 12/08/17- 01/26/18  4. Followed by adjuvant antiestrogen therapy anastrozole started 01/17/2018 (originally started 07/15/2016) -------------------------------------------------------------------- Low back pain August 2023: Underwent CT myelogram: Large expansile lesion in the sacrum with extraosseous extension of the tumor, diffuse lytic lesions throughout the visualized spine with metastatic disease myeloma is considered less likely.  (This was ordered by Dr. Marcene Corning)   Treatment plan: 1.  PET CT scan 12/27/2021: Widespread bone metastatic disease largest lesion involving the sacrum with pathological fractures of T6 and T10 retroperitoneal and pelvic lymph node metastasis, right axillary lymph node, activity in the pancreatic head, hypermetabolic activity in the adrenal glands, right thyroid nodule. 2. biopsy of sacrum: 12/26/2021: Metastatic breast cancer, ER 90%, PR 10%, HER2 negative (0) 3.  Ibrance along with Faslodex started 12/25/2021-10/14/2022 4.  Xgeva for bone metastases.  Every 3 months 5.  Palliative radiation to the sacrum completed 01/19/2022 ---------------------------------------------------------------------------------------------------------------------- Current treatment: Enhertu started 10/21/2022, today cycle 2 We plan to continue this treatment irrespective of her blood counts.  Enhertu toxicities: Liver function tests  have started to improve. Return to clinic every 3 weeks for Enhertu treatment.

## 2022-11-12 NOTE — Progress Notes (Signed)
Patient Care Team: Ollen Bowl, MD as PCP - General (Internal Medicine) [name], MD as Consulting Physician (Hematology and Oncology) Almond Lint, MD as Consulting Physician (General Surgery) Dorothy Puffer, MD as Consulting Physician (Radiation Oncology) Carmina Miller, MD as Consulting Physician (Radiation Oncology)  DIAGNOSIS:  Encounter Diagnosis  Name Primary?   Malignant neoplasm of lower-outer quadrant of left breast of female, estrogen receptor positive (HCC) Yes    SUMMARY OF ONCOLOGIC HISTORY: Oncology History  Malignant neoplasm of lower-outer quadrant of left breast of female, estrogen receptor positive (HCC)  06/11/2016 Mammogram   Palpable left breast masses 3:00 position: 2.2 cm; 5:30 position: 2.5 cm; 6:30 position: 0.7 cm   06/19/2016 Initial Diagnosis   Left breast biopsy 3:30: IDC with DCIS grade 1, ER 90%, PR 50%, Ki-67 15%, HER-2 negative ratio 1.13; biopsy 5:30 position: IDC grade 1   07/13/2016 Breast MRI   Large area of abnormal enhancement lower inner and lower outer quadrants left breast spanning 9 cm x 6.4 cm x 5.3 cm, no abnormal enlarged lymph nodes; T3 N0 stage II a (New AJCC staging)    07/15/2016 - 12/08/2017 Anti-estrogen oral therapy   Neoadjuvant anastrozole 1 mg daily   07/17/2016 Oncotype testing   Testing done on the biopsy: Oncotype DX score 22, intermediate risk   02/02/2017 Breast MRI   Left breast multicentric disease unchanged measuring 2.7 x 1.6 cm.  Mass in the non-mass enhancement are also not significantly changed measuring 6.2 x 2.4 cm. new enhancing mass within the outer right breast 7 mm which could be fat necrosis or inclusion cyst    02/09/2017 Imaging   Ultrasound of the right breast lesion noted on MRI: No sonographic finding corresponds to the abnormality noted on MRI   07/13/2017 Cancer Staging   Staging form: Breast, AJCC 8th Edition - Clinical stage from 07/13/2017: Stage IIA (cT3, cN0, cM0, G1, ER+, PR+, HER2-) -  Signed by Loa Socks, NP on 05/18/2018   10/26/2017 Surgery   Left mastectomy: IDC grade 1, 2 foci largest spans 8.5 cm, intermediate grade DCIS, lymphovascular invasion identified, perineural invasion identified, 1/2 lymph nodes positive with extracapsular extension, ER 9200%, PR 5 to 50%, HER-2 negative, Ki-67 10 to 15%, T3N1A Mammaprint: low risk   11/02/2017 Cancer Staging   Staging form: Breast, AJCC 8th Edition - Pathologic: No Stage Recommended (ypT3, pN1a, cM0, G1, ER+, PR+, HER2-) - Signed by [name], MD on 11/02/2017   12/08/2017 - 01/26/2018 Radiation Therapy   Adjuvant radiation therapy    02/2018 -  Anti-estrogen oral therapy   Anastrozole 1 mg daily adjuvant therapy   10/21/2022 -  Chemotherapy   Patient is on Treatment Plan : BREAST METASTATIC Fam-Trastuzumab Deruxtecan-nxki (Enhertu) (5.4) q21d       CHIEF COMPLIANT: Metastatic breast cancer   INTERVAL HISTORY: Kathryn Lucas is a 62 y.o. with above-mentioned history of metastatic breast cancer who is here to receive her first cycle of Enhertu. Patient denies any back pain or neck pain. Patient reports pressure behind the left ear.   ALLERGIES:  is allergic to percocet [oxycodone-acetaminophen].  MEDICATIONS:  Current Outpatient Medications  Medication Sig Dispense Refill   omeprazole (PRILOSEC) 40 MG capsule 1 capsule twice a day for 2 weeks and then once daily Orally twice a day for 30 days     pantoprazole (PROTONIX) 40 MG tablet 1 tablet Orally Once a day for 30 days Take 1 tablet twice daily for 2 weeks then take 1  tablet daily.     azelastine (ASTELIN) 0.1 % nasal spray Place into both nostrils 2 (two) times daily. (Patient not taking: Reported on 11/03/2022)     Calcium 500-100 MG-UNIT CHEW Chew 1 tablet by mouth daily. (Patient not taking: Reported on 11/03/2022) 60 tablet    cetirizine (ZYRTEC) 10 MG tablet Take by mouth. (Patient not taking: Reported on 11/03/2022)     cholecalciferol (VITAMIN  D3) 25 MCG (1000 UNIT) tablet Take 1 tablet (1,000 Units total) by mouth daily. (Patient not taking: Reported on 11/03/2022)     dexamethasone (DECADRON) 4 MG tablet Take 1 tab for 2 days after chemo 30 tablet 1   levothyroxine (SYNTHROID) 112 MCG tablet Take 1 tablet by mouth daily.     LORazepam (ATIVAN) 0.5 MG tablet Take 1 tablet (0.5 mg total) by mouth at bedtime. (Patient not taking: Reported on 11/03/2022) 30 tablet 0   ondansetron (ZOFRAN) 8 MG tablet Take 1 tablet (8 mg total) by mouth every 8 (eight) hours as needed for nausea or vomiting. Start on the third day after chemotherapy. (Patient not taking: Reported on 11/03/2022) 30 tablet 1   prochlorperazine (COMPAZINE) 10 MG tablet Take 1 tablet (10 mg total) by mouth every 6 (six) hours as needed for nausea or vomiting. (Patient not taking: Reported on 11/03/2022) 30 tablet 1   sucralfate (CARAFATE) 1 g tablet Take 1 tablet (1 g total) by mouth 4 (four) times daily -  with meals and at bedtime. Dissolve in warm water, swish and swallow 120 tablet 1   vitamin C (ASCORBIC ACID) 250 MG tablet Take 1 tablet (250 mg total) by mouth daily. (Patient not taking: Reported on 11/03/2022)     No current facility-administered medications for this visit.    PHYSICAL EXAMINATION: ECOG PERFORMANCE STATUS: 1 - Symptomatic but completely ambulatory  Vitals:   11/12/22 1432  BP: 122/78  Pulse: (!) 115  Resp: 18  Temp: 97.9 F (36.6 C)  SpO2: 98%   Filed Weights   11/12/22 1432  Weight: 135 lb 3.2 oz (61.3 kg)      LABORATORY DATA:  I have reviewed the data as listed    Latest Ref Rng & Units 11/10/2022    2:50 PM 10/21/2022   10:54 AM 10/13/2022   10:47 AM  CMP  Glucose 70 - 99 mg/dL 96  366  440   BUN 8 - 23 mg/dL 5  9  16    Creatinine 0.44 - 1.00 mg/dL 3.47  4.25  9.56   Sodium 135 - 145 mmol/L 134  130  129   Potassium 3.5 - 5.1 mmol/L 3.8  3.6  3.8   Chloride 98 - 111 mmol/L 103  101  99   CO2 22 - 32 mmol/L 25  21  19    Calcium 8.9 -  10.3 mg/dL 8.2  8.5  8.2   Total Protein 6.5 - 8.1 g/dL 5.9  6.4  6.6   Total Bilirubin 0.3 - 1.2 mg/dL 0.4  0.9  1.2   Alkaline Phos 38 - 126 U/L 73  89  92   AST 15 - 41 U/L 63  248  189   ALT 0 - 44 U/L 16  33  38     Lab Results  Component Value Date   WBC 3.2 (L) 11/10/2022   HGB 10.9 (L) 11/10/2022   HCT 31.5 (L) 11/10/2022   MCV 104.7 (H) 11/10/2022   PLT 293 11/10/2022   NEUTROABS 1.9 11/10/2022  ASSESSMENT & PLAN:  Malignant neoplasm of lower-outer quadrant of left breast of female, estrogen receptor positive (HCC) 10/26/17: Left mastectomy: IDC grade 1, 2 foci largest spans 8.5 cm, intermediate grade DCIS, lymphovascular invasion identified, perineural invasion identified, 1/2 lymph nodes positive with extracapsular extension, ER 9200%, PR 5 to 50%, HER-2 negative, Ki-67 10 to 15%, T3N1A   Oncotype DX score 22, intermediate risk, chemotherapy not felt to have significant benefit.   Treatment Summary: 1. Antiestrogen therapy with anastrozole 1 mg daily started 07/15/2016 2. Mastectomy 10/26/2017, Mammaprint low risk luminal type A 3. Followed by adjuvant radiation 12/08/17- 01/26/18  4. Followed by adjuvant antiestrogen therapy anastrozole started 01/17/2018 (originally started 07/15/2016) -------------------------------------------------------------------- Low back pain August 2023: Underwent CT myelogram: Large expansile lesion in the sacrum with extraosseous extension of the tumor, diffuse lytic lesions throughout the visualized spine with metastatic disease myeloma is considered less likely.  (This was ordered by Dr. Marcene Corning)   Treatment plan: 1.  PET CT scan 12/27/2021: Widespread bone metastatic disease largest lesion involving the sacrum with pathological fractures of T6 and T10 retroperitoneal and pelvic lymph node metastasis, right axillary lymph node, activity in the pancreatic head, hypermetabolic activity in the adrenal glands, right thyroid nodule. 2. biopsy  of sacrum: 12/26/2021: Metastatic breast cancer, ER 90%, PR 10%, HER2 negative (0) 3.  Ibrance along with Faslodex started 12/25/2021-10/14/2022 4.  Xgeva for bone metastases.  Every 3 months 5.  Palliative radiation to the sacrum completed 01/19/2022 ---------------------------------------------------------------------------------------------------------------------- Current treatment: Enhertu started 10/21/2022, today cycle 2 We plan to continue this treatment irrespective of her blood counts.  Enhertu toxicities: Tolerated extremely well without any problems. Completed radiation  Liver function tests have started to improve. Return to clinic every 3 weeks for Enhertu treatment.    No orders of the defined types were placed in this encounter.  The patient has a good understanding of the overall plan. she agrees with it. she will call with any problems that may develop before the next visit here. Total time spent: 30 mins including face to face time and time spent for planning, charting and co-ordination of care   Tamsen Meek, MD 11/12/22    I Janan Ridge am acting as a Neurosurgeon for [employer]  I have reviewed the above documentation for accuracy and completeness, and I agree with the above.

## 2022-11-12 NOTE — Progress Notes (Unsigned)
Per MD, OK to treat w/ Enhertu today with 110 BPM HR. Elease Etienne, RN made aware.

## 2022-11-12 NOTE — Patient Instructions (Signed)
[facility]  Discharge Instructions: Thank you for choosing [facility] to provide your oncology and hematology care.   If you have a lab appointment with the Cancer Center, please go directly to the Cancer Center and check in at the registration area.   Wear comfortable clothing and clothing appropriate for easy access to any Portacath or PICC line.   We strive to give you quality time with your provider. You may need to reschedule your appointment if you arrive late (15 or more minutes).  Arriving late affects you and other patients whose appointments are after yours.  Also, if you miss three or more appointments without notifying the office, you may be dismissed from the clinic at the provider's discretion.      For prescription refill requests, have your pharmacy contact our office and allow 72 hours for refills to be completed.    Today you received the following chemotherapy and/or immunotherapy agents Enhertu.   To help prevent nausea and vomiting after your treatment, we encourage you to take your nausea medication as directed.  BELOW ARE SYMPTOMS THAT SHOULD BE REPORTED IMMEDIATELY: *FEVER GREATER THAN 100.4 F (38 C) OR HIGHER *CHILLS OR SWEATING *NAUSEA AND VOMITING THAT IS NOT CONTROLLED WITH YOUR NAUSEA MEDICATION *UNUSUAL SHORTNESS OF BREATH *UNUSUAL BRUISING OR BLEEDING *URINARY PROBLEMS (pain or burning when urinating, or frequent urination) *BOWEL PROBLEMS (unusual diarrhea, constipation, pain near the anus) TENDERNESS IN MOUTH AND THROAT WITH OR WITHOUT PRESENCE OF ULCERS (sore throat, sores in mouth, or a toothache) UNUSUAL RASH, SWELLING OR PAIN  UNUSUAL VAGINAL DISCHARGE OR ITCHING   Items with * indicate a potential emergency and should be followed up as soon as possible or go to the Emergency Department if any problems should occur.  Please show the CHEMOTHERAPY ALERT CARD or IMMUNOTHERAPY ALERT CARD at check-in  to the Emergency Department and triage nurse.  Should you have questions after your visit or need to cancel or reschedule your appointment, please contact [facility]  Dept: [phone]  and follow the prompts.  Office hours are 8:00 a.m. to 4:30 p.m. Monday - Friday. Please note that voicemails left after 4:00 p.m. may not be returned until the following business day.  We are closed weekends and major holidays. You have access to a nurse at all times for urgent questions. Please call the main number to the clinic Dept: [phone] and follow the prompts.   For any non-urgent questions, you may also contact your provider using MyChart. We now offer e-Visits for anyone 18 and older to request care online for non-urgent symptoms. For details visit [email or web address].   Also download the MyChart app! Go to the app store, search "MyChart", open the app, select [facility], and log in with your MyChart username and password.   

## 2022-11-12 NOTE — Progress Notes (Signed)
Per Dr Al Pimple, continue Enhertu at 3.2mg /kg

## 2022-11-13 ENCOUNTER — Telehealth: Payer: Self-pay

## 2022-11-13 NOTE — Telephone Encounter (Signed)
Returned pt call regarding constipation. Pt states that she is "passing gas but nothing is coming out." This RN suggested OTC Colace to pt and if that does not work she can try docusate sodium. Pt verbalized understanding.

## 2022-11-14 ENCOUNTER — Telehealth: Payer: Self-pay | Admitting: Hematology

## 2022-11-14 NOTE — Telephone Encounter (Signed)
Pt called for constipation.  She feels is related to her chemo 2 days ago.  She has not had a bowel movement for 3 to 4 days.  No nausea.  She has tried Dulcolax a total of 3 doses in the past day, I recommend her to try MiraLAX double dose every 3-4 hours until she has bowel movement, and have a bottle of magnesium citrate to use tomorrow if she is still does not have bowel movement.  She knows to call us back if it does not work.  Malachy Mood  11/14/2022

## 2022-11-17 ENCOUNTER — Ambulatory Visit
Admission: RE | Admit: 2022-11-17 | Discharge: 2022-11-17 | Disposition: A | Discharge status: Home / Self Care | Payer: Medicaid Other | Source: Ambulatory Visit | Attending: Internal Medicine | Admitting: Internal Medicine

## 2022-11-17 ENCOUNTER — Other Ambulatory Visit: Payer: Self-pay | Admitting: Radiation Oncology

## 2022-11-17 ENCOUNTER — Telehealth: Payer: Self-pay

## 2022-11-17 DIAGNOSIS — C7951 Secondary malignant neoplasm of bone: Secondary | ICD-10-CM | POA: Diagnosis present

## 2022-11-17 MED ORDER — IOHEXOL 300 MG/ML  SOLN
75.0000 mL | Freq: Once | INTRAMUSCULAR | Status: AC | PRN
  Administered 2022-11-17: 75 mL via INTRAVENOUS

## 2022-11-17 NOTE — Telephone Encounter (Signed)
Pt called after-hours on Friday regarding a medication question. This RN returned pt call and pt stated that she was able to get all of her questions answered and that she was also able to follow-up about her constipation. Pt states she has no further questions at this time.

## 2022-11-18 ENCOUNTER — Ambulatory Visit: Payer: Commercial Managed Care - PPO | Admitting: Physical Therapy

## 2022-11-19 ENCOUNTER — Inpatient Hospital Stay: Payer: Medicaid Other | Attending: Hematology and Oncology | Admitting: Internal Medicine

## 2022-11-19 ENCOUNTER — Ambulatory Visit: Payer: Medicaid Other | Admitting: Cardiology

## 2022-11-19 ENCOUNTER — Ambulatory Visit: Payer: Medicaid Other | Admitting: Internal Medicine

## 2022-11-19 VITALS — BP 122/74 | HR 103 | Temp 97.9°F | Resp 18

## 2022-11-19 DIAGNOSIS — C7951 Secondary malignant neoplasm of bone: Secondary | ICD-10-CM | POA: Insufficient documentation

## 2022-11-19 DIAGNOSIS — Z79811 Long term (current) use of aromatase inhibitors: Secondary | ICD-10-CM | POA: Diagnosis not present

## 2022-11-19 DIAGNOSIS — Z9012 Acquired absence of left breast and nipple: Secondary | ICD-10-CM | POA: Diagnosis not present

## 2022-11-19 DIAGNOSIS — Z9221 Personal history of antineoplastic chemotherapy: Secondary | ICD-10-CM | POA: Insufficient documentation

## 2022-11-19 DIAGNOSIS — Z923 Personal history of irradiation: Secondary | ICD-10-CM | POA: Diagnosis not present

## 2022-11-19 DIAGNOSIS — C50512 Malignant neoplasm of lower-outer quadrant of left female breast: Secondary | ICD-10-CM | POA: Insufficient documentation

## 2022-11-19 DIAGNOSIS — Z79899 Other long term (current) drug therapy: Secondary | ICD-10-CM | POA: Insufficient documentation

## 2022-11-19 DIAGNOSIS — Z5112 Encounter for antineoplastic immunotherapy: Secondary | ICD-10-CM | POA: Diagnosis present

## 2022-11-19 DIAGNOSIS — Z170 Estrogen receptor positive status [ER+]: Secondary | ICD-10-CM | POA: Insufficient documentation

## 2022-11-19 DIAGNOSIS — H538 Other visual disturbances: Secondary | ICD-10-CM | POA: Insufficient documentation

## 2022-11-19 NOTE — Progress Notes (Signed)
[facility] at [facility] [address]  [place], [place] [address] [phone]   Interval Evaluation  Date of Service: 11/19/22 Patient Name: Kathryn Lucas Patient MRN: [id number] Patient DOB: 08/03/1960 Provider: Henreitta Leber, MD  Identifying Statement:  Kathryn Lucas is a 62 y.o. female with Metastatic cancer to spine [facility])    Primary Cancer:  Oncologic History: Oncology History  Malignant neoplasm of lower-outer quadrant of left breast of female, estrogen receptor positive (HCC)  06/11/2016 Mammogram   Palpable left breast masses 3:00 position: 2.2 cm; 5:30 position: 2.5 cm; 6:30 position: 0.7 cm   06/19/2016 Initial Diagnosis   Left breast biopsy 3:30: IDC with DCIS grade 1, ER 90%, PR 50%, Ki-67 15%, HER-2 negative ratio 1.13; biopsy 5:30 position: IDC grade 1   07/13/2016 Breast MRI   Large area of abnormal enhancement lower inner and lower outer quadrants left breast spanning 9 cm x 6.4 cm x 5.3 cm, no abnormal enlarged lymph nodes; T3 N0 stage II a (New AJCC staging)    07/15/2016 - 12/08/2017 Anti-estrogen oral therapy   Neoadjuvant anastrozole 1 mg daily   07/17/2016 Oncotype testing   Testing done on the biopsy: Oncotype DX score 22, intermediate risk   02/02/2017 Breast MRI   Left breast multicentric disease unchanged measuring 2.7 x 1.6 cm.  Mass in the non-mass enhancement are also not significantly changed measuring 6.2 x 2.4 cm. new enhancing mass within the outer right breast 7 mm which could be fat necrosis or inclusion cyst    02/09/2017 Imaging   Ultrasound of the right breast lesion noted on MRI: No sonographic finding corresponds to the abnormality noted on MRI   07/13/2017 Cancer Staging   Staging form: Breast, AJCC 8th Edition - Clinical stage from 07/13/2017: Stage IIA (cT3, cN0, cM0, G1, ER+, PR+, HER2-) - Signed by Loa Socks, NP on 05/18/2018   10/26/2017 Surgery   Left mastectomy: IDC grade 1, 2 foci  largest spans 8.5 cm, intermediate grade DCIS, lymphovascular invasion identified, perineural invasion identified, 1/2 lymph nodes positive with extracapsular extension, ER 9200%, PR 5 to 50%, HER-2 negative, Ki-67 10 to 15%, T3N1A Mammaprint: low risk   11/02/2017 Cancer Staging   Staging form: Breast, AJCC 8th Edition - Pathologic: No Stage Recommended (ypT3, pN1a, cM0, G1, ER+, PR+, HER2-) - Signed by [name], MD on 11/02/2017   12/08/2017 - 01/26/2018 Radiation Therapy   Adjuvant radiation therapy    02/2018 -  Anti-estrogen oral therapy   Anastrozole 1 mg daily adjuvant therapy   10/21/2022 -  Chemotherapy   Patient is on Treatment Plan : BREAST METASTATIC Fam-Trastuzumab Deruxtecan-nxki (Enhertu) (5.4) q21d       Interval History: TAKERRIA PUMA presents today for follow up after recent CT head study.  Since prior visit she has started and received 2 cycles of Enhertu.  Also underwent radiation treatments for bony disease in cervical spine with Dr. Rushie Chestnut in [place].  No recurrence of neurologic episode described in prior visit.  Neck mobility is improved following treatments.  H+P (09/24/22) Patient presents today to review recent neurologic symptoms.  She describes an episode of "sudden inability to speak, not making sense" which happened at the end of last month with her sister in [place].  This led to a hospitalization at [facility] for stroke evaluation.  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  She was started on decadron, which she attributes to subsequent blurry vision and double vision.  This has improved and mostly resolved since the steroids were stopped earlier in the week.  Continues to experience significant stiffness and immobility in her neck.  Medications: Current Outpatient Medications on File Prior to Visit  Medication Sig Dispense Refill   azelastine (ASTELIN) 0.1 %  nasal spray Place into both nostrils 2 (two) times daily. (Patient not taking: Reported on 11/03/2022)     Calcium 500-100 MG-UNIT CHEW Chew 1 tablet by mouth daily. (Patient not taking: Reported on 11/03/2022) 60 tablet    cetirizine (ZYRTEC) 10 MG tablet Take by mouth. (Patient not taking: Reported on 11/03/2022)     cholecalciferol (VITAMIN D3) 25 MCG (1000 UNIT) tablet Take 1 tablet (1,000 Units total) by mouth daily. (Patient not taking: Reported on 11/03/2022)     dexamethasone (DECADRON) 4 MG tablet Take 1 tab for 2 days after chemo 30 tablet 1   levothyroxine (SYNTHROID) 112 MCG tablet Take 1 tablet by mouth daily.     LORazepam (ATIVAN) 0.5 MG tablet Take 1 tablet (0.5 mg total) by mouth at bedtime. (Patient not taking: Reported on 11/03/2022) 30 tablet 0   omeprazole (PRILOSEC) 40 MG capsule 1 capsule twice a day for 2 weeks and then once daily Orally twice a day for 30 days     ondansetron (ZOFRAN) 8 MG tablet Take 1 tablet (8 mg total) by mouth every 8 (eight) hours as needed for nausea or vomiting. Start on the third day after chemotherapy. (Patient not taking: Reported on 11/03/2022) 30 tablet 1   pantoprazole (PROTONIX) 40 MG tablet 1 tablet Orally Once a day for 30 days Take 1 tablet twice daily for 2 weeks then take 1 tablet daily.     prochlorperazine (COMPAZINE) 10 MG tablet Take 1 tablet (10 mg total) by mouth every 6 (six) hours as needed for nausea or vomiting. (Patient not taking: Reported on 11/03/2022) 30 tablet 1   sucralfate (CARAFATE) 1 g tablet Take 1 tablet (1 g total) by mouth 4 (four) times daily -  with meals and at bedtime. Dissolve in warm water, swish and swallow 120 tablet 1   vitamin C (ASCORBIC ACID) 250 MG tablet Take 1 tablet (250 mg total) by mouth daily. (Patient not taking: Reported on 11/03/2022)     No current facility-administered medications on file prior to visit.    Allergies:  Allergies  Allergen Reactions   Percocet [Oxycodone-Acetaminophen] Nausea And  Vomiting    Severe GI upset and vomiting   Past Medical History:  Past Medical History:  Diagnosis Date   Breast cancer (HCC)    left breast cancer   Cancer (HCC) 09/2017   left breast cancer   Family history of breast cancer    Hypothyroidism    Personal history of radiation therapy 2019   Thyroid disease    Past Surgical History:  Past Surgical History:  Procedure Laterality Date   BREAST BIOPSY Left 2018   BREAST BIOPSY Left 2019   BREAST RECONSTRUCTION WITH PLACEMENT OF TISSUE EXPANDER AND ALLODERM Left 10/26/2017   Procedure: LEFT BREAST RECONSTRUCTION WITH PLACEMENT OF TISSUE EXPANDER AND ALLODERM;  Surgeon: Glenna Fellows, MD;  Location: [facility];  Service: Plastics;  Laterality: Left;   MASTECTOMY Left 2019   MASTECTOMY WITH RADIOACTIVE SEED GUIDED EXCISION AND AXILLARY SENTINEL LYMPH NODE BIOPSY Left 10/26/2017   Procedure: LEFT MASTECTOMY WITH SEED TARGETED  LEFT AXILLARY LYMPH NODE EXCISION  AND LEFT SENTINEL LYMPH NODE BIOPSY;  Surgeon: Almond Lint, MD;  Location: [facility];  Service: General;  Laterality: Left;   TONSILLECTOMY     WISDOM TOOTH EXTRACTION     Social History:  Social History   Socioeconomic History   Marital status: Single    Spouse name: Not on file   Number of children: Not on file   Years of education: Not on file   Highest education level: Not on file  Occupational History   Not on file  Tobacco Use   Smoking status: Never   Smokeless tobacco: Never  Vaping Use   Vaping status: Never Used  Substance and Sexual Activity   Alcohol use: Yes    Alcohol/week: 0.0 standard drinks of alcohol    Comment: social   Drug use: Never   Sexual activity: Not on file  Other Topics Concern   Not on file  Social History Narrative   Lives alone and one dog.   Social Determinants of Health   Financial Resource Strain: Not on file  Food Insecurity: No Food Insecurity (09/14/2022)   Hunger Vital Sign    Worried  About Running Out of Food in the Last Year: Never true    Ran Out of Food in the Last Year: Never true  Transportation Needs: No Transportation Needs (09/14/2022)   PRAPARE - Administrator, Civil Service (Medical): No    Lack of Transportation (Non-Medical): No  Physical Activity: Not on file  Stress: Not on file  Social Connections: Not on file  Intimate Partner Violence: Not At Risk (09/14/2022)   Humiliation, Afraid, Rape, and Kick questionnaire    Fear of Current or Ex-Partner: No    Emotionally Abused: No    Physically Abused: No    Sexually Abused: No   Family History:  Family History  Problem Relation Age of Onset   Breast cancer Mother 52   Stroke Sister        thought to be due to tamoxifen use   Dementia Maternal Grandmother    COPD Paternal Grandfather     Review of Systems: Constitutional: Doesn't report fevers, chills or abnormal weight loss Eyes: Doesn't report blurriness of vision Ears, nose, mouth, throat, and face: Doesn't report sore throat Respiratory: Doesn't report cough, dyspnea or wheezes Cardiovascular: Doesn't report palpitation, chest discomfort  Gastrointestinal:  Doesn't report nausea, constipation, diarrhea GU: Doesn't report incontinence Skin: Doesn't report skin rashes Neurological: Per HPI Musculoskeletal: Doesn't report joint pain Behavioral/Psych: Doesn't report anxiety  Physical Exam: Vitals:   11/19/22 1438  BP: 122/74  Pulse: (!) 103  Resp: 18  Temp: 97.9 F (36.6 C)  SpO2: 100%    KPS: 70. General: Alert, cooperative, pleasant, in no acute distress Head: normal ROM in neck, some discomfort EENT: No conjunctival injection or scleral icterus.  Lungs: Resp effort normal Cardiac: Regular rate Abdomen: Non-distended abdomen Skin: No rashes cyanosis or petechiae. Extremities: No clubbing or edema  Neurologic Exam: Mental Status: Awake, alert, attentive to examiner. Oriented to self and environment. Language is fluent  with intact comprehension.  Cranial Nerves: Visual acuity is grossly normal. Visual fields are full. Extra-ocular movements intact. No ptosis. Face is symmetric Motor: Tone and bulk are normal. Power is full in both arms and legs. Reflexes are symmetric, no pathologic reflexes present.  Sensory: Intact to light touch Gait: Deferred   Labs: I have reviewed the data as listed    Component Value Date/Time   NA  134 (L) 11/10/2022 1450   K 3.8 11/10/2022 1450   CL 103 11/10/2022 1450   CO2 25 11/10/2022 1450   GLUCOSE 96 11/10/2022 1450   BUN 5 (L) 11/10/2022 1450   CREATININE 0.48 11/10/2022 1450   CALCIUM 8.2 (L) 11/10/2022 1450   PROT 5.9 (L) 11/10/2022 1450   ALBUMIN 3.3 (L) 11/10/2022 1450   AST 63 (H) 11/10/2022 1450   ALT 16 11/10/2022 1450   ALKPHOS 73 11/10/2022 1450   BILITOT 0.4 11/10/2022 1450   GFRNONAA >60 11/10/2022 1450   Lab Results  Component Value Date   WBC 3.2 (L) 11/10/2022   NEUTROABS 1.9 11/10/2022   HGB 10.9 (L) 11/10/2022   HCT 31.5 (L) 11/10/2022   MCV 104.7 (H) 11/10/2022   PLT 293 11/10/2022    Imaging:  [facility] Clinician Interpretation: I have personally reviewed the CNS images as listed.  My interpretation, in the context of the patient's clinical presentation, is stable disease pending official read  NM PET Image Restag (PS) Skull Base To Thigh  Result Date: 10/29/2022 CLINICAL DATA:  Subsequent treatment strategy for metastatic breast cancer. EXAM: NUCLEAR MEDICINE PET SKULL BASE TO THIGH TECHNIQUE: 6.65 mCi F-18 FDG was injected intravenously. Full-ring PET imaging was performed from the skull base to thigh after the radiotracer. CT data was obtained and used for attenuation correction and anatomic localization. Fasting blood glucose: 94 mg/dl COMPARISON:  PET-CT 46/96/2952 FINDINGS: Mediastinal blood pool activity: SUV max 2.52 Liver activity: SUV max NA NECK: No hypermetabolic lymph nodes in the neck. Incidental CT findings: None. CHEST: No  hypermetabolic mediastinal or hilar nodes. No suspicious pulmonary nodules on the CT scan. Stable 7 mm right axillary lymph node with SUV max of 4.44 this was previously 4.93. Incidental CT findings: Stable left breast prosthesis or tissue expander. No right mass lesions. ABDOMEN/PELVIS: Mild stable hypermetabolism involving both adrenal glands without renal gland lesions. No evidence of hepatic metastatic disease. Stable hypermetabolic retroperitoneal lymph nodes. The inter aortocaval node has an SUV max of 4.30 (previously 6.28) and the right iliac nodes have an SUV max of 5.4 (previously 6.76). Incidental CT findings: Stable scattered vascular calcifications. New mild right-sided hydronephrosis. SKELETON: Persistent diffuse lytic/destructive metastatic disease involving the axial appendicular skeleton. Left humeral head lesion has an SUV max of 5.97 and was previously 8.2 Fourth right posterior rib lesion has an SUV max of 6.99 and was previously 8.2. Sternal lesion has an SUV max of 6.50 and was previously 9.66 L3 lesion has an SUV max 6.75 and was previously 7.53. Right iliac bone lesion has an SUV max of 6.32 and was previously 9.76. Extensive sacral disease has improved. SUV max is 6.3 and was previously 10.72 Right hip lesion has an SUV max of 6.21 and was previously 8.37. Incidental CT findings: Most of the bone lesions do demonstrate some healing changes when compared to the prior PET-CT with increasing bony sclerosis and less lytic changes. IMPRESSION: 1. Overall improved CT appearance of the diffuse lytic/destructive metastatic disease involving the axial and appendicular skeleton. Interval slight decrease and hypermetabolism. 2. Stable hypermetabolic right axillary lymph node. 3. Hypermetabolic retroperitoneal lymph nodes with slight interval decrease in hypermetabolism. 4. No findings for hepatic metastatic disease. Stable mild hypermetabolism both adrenal glands but no adrenal gland lesions. 5. New  mild right-sided hydronephrosis. Electronically Signed   By: Rudie Meyer M.D.   On: 10/29/2022 09:43     Assessment/Plan Metastatic cancer to spine [facility])  [name] is clinically stable  today.  CT head demonstrates apparent improvement or resolution of enhancing disease encroaching from skull into brain parenchyma.  This improvement can be attributed to initiation of Enhertu on 10/21/22 with Dr. Pamelia Hoit.  MRI brain remains unavailable because of expander in place.  Would not resume corticosteroids at this time.  Will also defer AED for now, unless episode repeats.  Neck ROM is improved following RT with Dr. Rushie Chestnut.  We appreciate the opportunity to participate in the care of GENEAL FARANDA.  She may RTC as needed with new or progressive neurologic symptoms.  All questions were answered. The patient knows to call the clinic with any problems, questions or concerns. No barriers to learning were detected.  The total time spent in the encounter was 40 minutes and more than 50% was on counseling and review of test results   Henreitta Leber, MD Medical Director of Neuro-Oncology [facility] 11/19/22 2:30 PM

## 2022-11-23 ENCOUNTER — Telehealth: Payer: Self-pay | Admitting: *Deleted

## 2022-11-23 ENCOUNTER — Ambulatory Visit: Payer: Commercial Managed Care - PPO | Admitting: Physical Therapy

## 2022-11-23 ENCOUNTER — Ambulatory Visit: Payer: Medicaid Other | Admitting: Pharmacist

## 2022-11-23 NOTE — Progress Notes (Signed)
[facility]       Telephone: [phone]?Fax: [phone]   Oncology Clinical Pharmacist Practitioner Progress Note  Kathryn Lucas is a 62 y.o. female with a diagnosis of metastatic breast cancer currently on fam-trastuzumab deruxtecan-nxki (Enhertu) under the care of Dr. [name].   I connected with Kathryn Lucas today by telephone and verified that I was speaking with the correct person using two patient identifiers. I discussed the limitations, risks, security and privacy concerns of performing an evaluation and management service by telemedicine and the availability of in-person appointments. The patient/caregiver expressed understanding and agreed to proceed.  Other persons participating in the visit and their role in the encounter: none   Patient's location: home  Provider's location: clinic  Kathryn Lucas contacted clinical pharmacy and left a voicemail. She was inquiring about her constipation she has been experiencing. She states she had a large bowel movement on Saturday after taking several bisacodyl (Dulcolax). She stated she had been taking Senna but when we initially spoke we had recommended Senna-S which is the combination stool softener + stimulant. This was during her fam-trastuzumab deruxtecan-nxki chemotherapy education visit. She will try this instead and we discussed how to take this medication. She can also use Miralax as needed which was recommended by Dr. Mosetta Putt last week. We also encouraged her to increase her fruits and vegetable intake, increase her fluid intake, use prune juice as needed, and stay active. She does report doing a lot of sitting lately which may be contributing to the constipation. Her next infusion with labs and a visit with [name] is scheduled for 12/01/22.   Kathryn Lucas participated in the discussion, expressed understanding, and voiced agreement with the above plan. All questions were answered to her satisfaction. The  patient was advised to contact the clinic at [phone] with any questions or concerns prior to her return visit.  Clinical pharmacy will continue to support Kathryn Lucas and Dr. [name] as needed.  Telford Archambeau A. Odetta Pink, PharmD, BCOP, CPP  Anselm Lis, RPH-CPP,  11/23/2022  10:33 AM   **Disclaimer: This note was dictated with voice recognition software. Similar sounding words can inadvertently be transcribed and this note may contain transcription errors which may not have been corrected upon publication of note.**

## 2022-11-23 NOTE — Telephone Encounter (Signed)
This RN returned VM left by pt stating she called Friday and did not get a return call.  Obtained identified VM- message left requesting call back - as well as noted scheduled visit at 10 am with Mackie Pai .

## 2022-11-25 ENCOUNTER — Ambulatory Visit: Payer: Commercial Managed Care - PPO | Admitting: Physical Therapy

## 2022-11-30 ENCOUNTER — Ambulatory Visit: Payer: Commercial Managed Care - PPO | Admitting: Physical Therapy

## 2022-11-30 MED FILL — Dexamethasone Sodium Phosphate Inj 100 MG/10ML: INTRAMUSCULAR | Qty: 1 | Status: AC

## 2022-11-30 MED FILL — Fosaprepitant Dimeglumine For IV Infusion 150 MG (Base Eq): INTRAVENOUS | Qty: 5 | Status: AC

## 2022-12-01 ENCOUNTER — Inpatient Hospital Stay (HOSPITAL_BASED_OUTPATIENT_CLINIC_OR_DEPARTMENT_OTHER): Payer: Medicaid Other | Admitting: Adult Health

## 2022-12-01 ENCOUNTER — Inpatient Hospital Stay: Payer: Medicaid Other

## 2022-12-01 DIAGNOSIS — C50512 Malignant neoplasm of lower-outer quadrant of left female breast: Secondary | ICD-10-CM | POA: Diagnosis not present

## 2022-12-01 DIAGNOSIS — Z170 Estrogen receptor positive status [ER+]: Secondary | ICD-10-CM

## 2022-12-01 LAB — CMP (CANCER CENTER ONLY)
ALT: 13 U/L (ref 0–44)
AST: 26 U/L (ref 15–41)
Albumin: 3.3 g/dL — ABNORMAL LOW (ref 3.5–5.0)
Alkaline Phosphatase: 86 U/L (ref 38–126)
Anion gap: 4 — ABNORMAL LOW (ref 5–15)
BUN: 10 mg/dL (ref 8–23)
CO2: 27 mmol/L (ref 22–32)
Calcium: 8.7 mg/dL — ABNORMAL LOW (ref 8.9–10.3)
Chloride: 106 mmol/L (ref 98–111)
Creatinine: 0.64 mg/dL (ref 0.44–1.00)
GFR, Estimated: >60 mL/min (ref >60)
Glucose, Bld: 80 mg/dL (ref 70–99)
Potassium: 3.9 mmol/L (ref 3.5–5.1)
Sodium: 137 mmol/L (ref 135–145)
Total Bilirubin: 0.3 mg/dL (ref 0.3–1.2)
Total Protein: 6.1 g/dL — ABNORMAL LOW (ref 6.5–8.1)

## 2022-12-01 LAB — CBC WITH DIFFERENTIAL (CANCER CENTER ONLY)
Abs Immature Granulocytes: 0.02 10*3/uL (ref 0.00–0.07)
Basophils Absolute: 0 10*3/uL (ref 0.0–0.1)
Basophils Relative: 1 %
Eosinophils Absolute: 0.1 10*3/uL (ref 0.0–0.5)
Eosinophils Relative: 4 %
HCT: 30.1 % — ABNORMAL LOW (ref 36.0–46.0)
Hemoglobin: 10 g/dL — ABNORMAL LOW (ref 12.0–15.0)
Immature Granulocytes: 1 %
Lymphocytes Relative: 19 %
Lymphs Abs: 0.5 10*3/uL — ABNORMAL LOW (ref 0.7–4.0)
MCH: 35.8 pg — ABNORMAL HIGH (ref 26.0–34.0)
MCHC: 33.2 g/dL (ref 30.0–36.0)
MCV: 107.9 fL — ABNORMAL HIGH (ref 80.0–100.0)
Monocytes Absolute: 0.4 10*3/uL (ref 0.1–1.0)
Monocytes Relative: 16 %
Neutro Abs: 1.5 10*3/uL — ABNORMAL LOW (ref 1.7–7.7)
Neutrophils Relative %: 59 %
Platelet Count: 250 10*3/uL (ref 150–400)
RBC: 2.79 MIL/uL — ABNORMAL LOW (ref 3.87–5.11)
RDW: 18 % — ABNORMAL HIGH (ref 11.5–15.5)
WBC Count: 2.6 10*3/uL — ABNORMAL LOW (ref 4.0–10.5)
nRBC: 0 % (ref 0.0–0.2)

## 2022-12-01 MED ORDER — DIPHENHYDRAMINE HCL 25 MG PO CAPS
50.0000 mg | ORAL_CAPSULE | Freq: Once | ORAL | Status: AC
  Administered 2022-12-01: 50 mg via ORAL
  Filled 2022-12-01: qty 2

## 2022-12-01 MED ORDER — ACETAMINOPHEN 325 MG PO TABS
650.0000 mg | ORAL_TABLET | Freq: Once | ORAL | Status: AC
  Administered 2022-12-01: 650 mg via ORAL
  Filled 2022-12-01: qty 2

## 2022-12-01 MED ORDER — FAM-TRASTUZUMAB DERUXTECAN-NXKI CHEMO 100 MG IV SOLR
3.2000 mg/kg | Freq: Once | INTRAVENOUS | Status: AC
  Administered 2022-12-01: 200 mg via INTRAVENOUS
  Filled 2022-12-01: qty 10

## 2022-12-01 MED ORDER — DEXTROSE 5 % IV SOLN: Freq: Once | INTRAVENOUS | Status: AC

## 2022-12-01 MED ORDER — SODIUM CHLORIDE 0.9 % IV SOLN
150.0000 mg | Freq: Once | INTRAVENOUS | Status: AC
  Administered 2022-12-01: 150 mg via INTRAVENOUS
  Filled 2022-12-01: qty 150

## 2022-12-01 MED ORDER — PALONOSETRON HCL INJECTION 0.25 MG/5ML
0.2500 mg | Freq: Once | INTRAVENOUS | Status: AC
  Administered 2022-12-01: 0.25 mg via INTRAVENOUS
  Filled 2022-12-01: qty 5

## 2022-12-01 MED ORDER — SODIUM CHLORIDE 0.9 % IV SOLN
10.0000 mg | Freq: Once | INTRAVENOUS | Status: AC
  Administered 2022-12-01: 10 mg via INTRAVENOUS
  Filled 2022-12-01: qty 10

## 2022-12-01 NOTE — Progress Notes (Signed)
Continue 3.2mg /kg per Dr Pamelia Hoit

## 2022-12-01 NOTE — Progress Notes (Unsigned)
[facility] Cancer Follow up:    Pahwani, [name], MD [address] [place] [address]   DIAGNOSIS: Cancer Staging  Malignant neoplasm of lower-outer quadrant of left breast of female, estrogen receptor positive (HCC) Staging form: Breast, AJCC 8th Edition - Clinical stage from 07/13/2017: Stage IIA (cT3, cN0, cM0, G1, ER+, PR+, HER2-) - Signed by Loa Socks, NP on 05/18/2018 Neoadjuvant therapy: No Histologic grading system: 3 grade system Laterality: Left - Pathologic: No Stage Recommended (ypT3, pN1a, cM0, G1, ER+, PR+, HER2-) - Signed by [name], MD on 11/02/2017 Stage prefix: Post-therapy Neoadjuvant therapy: Yes Response to neoadjuvant therapy: No response Histologic grading system: 3 grade system Laterality: Left Lymph-vascular invasion (LVI): BOTH lymphatic and small vessel AND venous (large vessel) invasion   SUMMARY OF ONCOLOGIC HISTORY: Oncology History  Malignant neoplasm of lower-outer quadrant of left breast of female, estrogen receptor positive (HCC)  06/11/2016 Mammogram   Palpable left breast masses 3:00 position: 2.2 cm; 5:30 position: 2.5 cm; 6:30 position: 0.7 cm   06/19/2016 Initial Diagnosis   Left breast biopsy 3:30: IDC with DCIS grade 1, ER 90%, PR 50%, Ki-67 15%, HER-2 negative ratio 1.13; biopsy 5:30 position: IDC grade 1   07/13/2016 Breast MRI   Large area of abnormal enhancement lower inner and lower outer quadrants left breast spanning 9 cm x 6.4 cm x 5.3 cm, no abnormal enlarged lymph nodes; T3 N0 stage II a (New AJCC staging)    07/15/2016 - 12/08/2017 Anti-estrogen oral therapy   Neoadjuvant anastrozole 1 mg daily   07/17/2016 Oncotype testing   Testing done on the biopsy: Oncotype DX score 22, intermediate risk   02/02/2017 Breast MRI   Left breast multicentric disease unchanged measuring 2.7 x 1.6 cm.  Mass in the non-mass enhancement are also not significantly changed measuring 6.2 x 2.4 cm. new  enhancing mass within the outer right breast 7 mm which could be fat necrosis or inclusion cyst    02/09/2017 Imaging   Ultrasound of the right breast lesion noted on MRI: No sonographic finding corresponds to the abnormality noted on MRI   07/13/2017 Cancer Staging   Staging form: Breast, AJCC 8th Edition - Clinical stage from 07/13/2017: Stage IIA (cT3, cN0, cM0, G1, ER+, PR+, HER2-) - Signed by Loa Socks, NP on 05/18/2018   10/26/2017 Surgery   Left mastectomy: IDC grade 1, 2 foci largest spans 8.5 cm, intermediate grade DCIS, lymphovascular invasion identified, perineural invasion identified, 1/2 lymph nodes positive with extracapsular extension, ER 9200%, PR 5 to 50%, HER-2 negative, Ki-67 10 to 15%, T3N1A Mammaprint: low risk   11/02/2017 Cancer Staging   Staging form: Breast, AJCC 8th Edition - Pathologic: No Stage Recommended (ypT3, pN1a, cM0, G1, ER+, PR+, HER2-) - Signed by [name], MD on 11/02/2017   12/08/2017 - 01/26/2018 Radiation Therapy   Adjuvant radiation therapy    02/2018 -  Anti-estrogen oral therapy   Anastrozole 1 mg daily adjuvant therapy   10/21/2022 -  Chemotherapy   Patient is on Treatment Plan : BREAST METASTATIC Fam-Trastuzumab Deruxtecan-nxki (Enhertu) (5.4) q21d       CURRENT THERAPY: Enhertu  INTERVAL HISTORY: Kathryn Lucas 62 y.o. female returns for    Patient Active Problem List   Diagnosis Date Noted   Metastatic 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) 10/14/2022   Elevated liver enzymes 10/14/2022   Unexplained weight loss 10/14/2022   Neoplasm related pain 10/14/2022   Metastatic malignant neoplasm (HCC) 12/19/2021  Family history of breast cancer    History of therapeutic radiation 04/27/2018   Breast cancer, left breast (HCC) 10/26/2017   Malignant neoplasm of lower-outer quadrant of left breast of female, estrogen receptor positive (HCC) 07/15/2016   Plantar fasciitis 07/30/2015   Metatarsalgia 10/18/2014    is allergic to percocet  [oxycodone-acetaminophen].  MEDICAL HISTORY: Past Medical History:  Diagnosis Date   Breast cancer (HCC)    left breast cancer   Cancer (HCC) 09/2017   left breast cancer   Family history of breast cancer    Hypothyroidism    Personal history of radiation therapy 2019   Thyroid disease     SURGICAL HISTORY: Past Surgical History:  Procedure Laterality Date   BREAST BIOPSY Left 2018   BREAST BIOPSY Left 2019   BREAST RECONSTRUCTION WITH PLACEMENT OF TISSUE EXPANDER AND ALLODERM Left 10/26/2017   Procedure: LEFT BREAST RECONSTRUCTION WITH PLACEMENT OF TISSUE EXPANDER AND ALLODERM;  Surgeon: Glenna Fellows, MD;  Location: [facility];  Service: Plastics;  Laterality: Left;   MASTECTOMY Left 2019   MASTECTOMY WITH RADIOACTIVE SEED GUIDED EXCISION AND AXILLARY SENTINEL LYMPH NODE BIOPSY Left 10/26/2017   Procedure: LEFT MASTECTOMY WITH SEED TARGETED  LEFT AXILLARY LYMPH NODE EXCISION AND LEFT SENTINEL LYMPH NODE BIOPSY;  Surgeon: Almond Lint, MD;  Location: [facility];  Service: General;  Laterality: Left;   TONSILLECTOMY     WISDOM TOOTH EXTRACTION      SOCIAL HISTORY: Social History   Socioeconomic History   Marital status: Single    Spouse name: Not on file   Number of children: Not on file   Years of education: Not on file   Highest education level: Not on file  Occupational History   Not on file  Tobacco Use   Smoking status: Never   Smokeless tobacco: Never  Vaping Use   Vaping status: Never Used  Substance and Sexual Activity   Alcohol use: Yes    Alcohol/week: 0.0 standard drinks of alcohol    Comment: social   Drug use: Never   Sexual activity: Not on file  Other Topics Concern   Not on file  Social History Narrative   Lives alone and one dog.   Social Determinants of Health   Financial Resource Strain: Not on file  Food Insecurity: No Food Insecurity (09/14/2022)   Hunger Vital Sign    Worried About Running Out of Food in  the Last Year: Never true    Ran Out of Food in the Last Year: Never true  Transportation Needs: No Transportation Needs (09/14/2022)   PRAPARE - Administrator, Civil Service (Medical): No    Lack of Transportation (Non-Medical): No  Physical Activity: Not on file  Stress: Not on file  Social Connections: Not on file  Intimate Partner Violence: Not At Risk (09/14/2022)   Humiliation, Afraid, Rape, and Kick questionnaire    Fear of Current or Ex-Partner: No    Emotionally Abused: No    Physically Abused: No    Sexually Abused: No    FAMILY HISTORY: Family History  Problem Relation Age of Onset   Breast cancer Mother 2   Stroke Sister        thought to be due to tamoxifen use   Dementia Maternal Grandmother    COPD Paternal Grandfather     Review of Systems - Oncology    PHYSICAL EXAMINATION    There were no vitals filed for this visit.  Physical  Exam  LABORATORY DATA:  CBC    Component Value Date/Time   WBC 2.6 (L) 12/01/2022 1432   WBC 6.6 08/07/2021 1622   RBC 2.79 (L) 12/01/2022 1432   HGB 10.0 (L) 12/01/2022 1432   HCT 30.1 (L) 12/01/2022 1432   PLT 250 12/01/2022 1432   MCV 107.9 (H) 12/01/2022 1432   MCH 35.8 (H) 12/01/2022 1432   MCHC 33.2 12/01/2022 1432   RDW 18.0 (H) 12/01/2022 1432   LYMPHSABS 0.5 (L) 12/01/2022 1432   MONOABS 0.4 12/01/2022 1432   EOSABS 0.1 12/01/2022 1432   BASOSABS 0.0 12/01/2022 1432    CMP     Component Value Date/Time   NA 137 12/01/2022 1432   K 3.9 12/01/2022 1432   CL 106 12/01/2022 1432   CO2 27 12/01/2022 1432   GLUCOSE 80 12/01/2022 1432   BUN 10 12/01/2022 1432   CREATININE 0.64 12/01/2022 1432   CALCIUM 8.7 (L) 12/01/2022 1432   PROT 6.1 (L) 12/01/2022 1432   ALBUMIN 3.3 (L) 12/01/2022 1432   AST 26 12/01/2022 1432   ALT 13 12/01/2022 1432   ALKPHOS 86 12/01/2022 1432   BILITOT 0.3 12/01/2022 1432   GFRNONAA >60 12/01/2022 1432       PENDING LABS:   RADIOGRAPHIC STUDIES:  No  results found.   PATHOLOGY:     ASSESSMENT and THERAPY PLAN:   No problem-specific Assessment & Plan notes found for this encounter.   No orders of the defined types were placed in this encounter.   All questions were answered. The patient knows to call the clinic with any problems, questions or concerns. We can certainly see the patient much sooner if necessary. This note was electronically signed. Noreene Filbert, NP 12/01/2022

## 2022-12-02 ENCOUNTER — Encounter: Payer: Self-pay | Admitting: Hematology and Oncology

## 2022-12-02 ENCOUNTER — Ambulatory Visit: Payer: Commercial Managed Care - PPO | Admitting: Physical Therapy

## 2022-12-02 ENCOUNTER — Encounter: Payer: Self-pay | Admitting: Adult Health

## 2022-12-02 NOTE — Assessment & Plan Note (Addendum)
10/26/17: Left mastectomy: IDC grade 1, 2 foci largest spans 8.5 cm, intermediate grade DCIS, lymphovascular invasion identified, perineural invasion identified, 1/2 lymph nodes positive with extracapsular extension, ER 9200%, PR 5 to 50%, HER-2 negative, Ki-67 10 to 15%, T3N1A   Oncotype DX score 22, intermediate risk, chemotherapy not felt to have significant benefit.   Treatment Summary: 1. Antiestrogen therapy with anastrozole 1 mg daily started 07/15/2016 2. Mastectomy 10/26/2017, Mammaprint low risk luminal type A 3. Followed by adjuvant radiation 12/08/17- 01/26/18  4. Followed by adjuvant antiestrogen therapy anastrozole started 01/17/2018 (originally started 07/15/2016) -------------------------------------------------------------------- Low back pain August 2023: Underwent CT myelogram: Large expansile lesion in the sacrum with extraosseous extension of the tumor, diffuse lytic lesions throughout the visualized spine with metastatic disease myeloma is considered less likely.  (This was ordered by Dr. Marcene Corning)   Treatment plan: 1.  PET CT scan 12/27/2021: Widespread bone metastatic disease largest lesion involving the sacrum with pathological fractures of T6 and T10 retroperitoneal and pelvic lymph node metastasis, right axillary lymph node, activity in the pancreatic head, hypermetabolic activity in the adrenal glands, right thyroid nodule. 2. biopsy of sacrum: 12/26/2021: Metastatic breast cancer, ER 90%, PR 10%, HER2 negative (0) 3.  Ibrance along with Faslodex started 12/25/2021-10/14/2022 4.  Xgeva for bone metastases.  Every 3 months 5.  Palliative radiation to the sacrum completed 01/19/2022 ---------------------------------------------------------------------------------------------------------------------- Current treatment: Enhertu started 10/21/2022, today cycle 3 We plan to continue this treatment irrespective of her blood counts.  Enhertu toxicities: Liver function tests  have normalized.  She has no signs of clinical progression and will proceed with treatment today at normal dose reduction.  Return to clinic every 3 weeks for Enhertu treatment.

## 2022-12-03 ENCOUNTER — Ambulatory Visit: Payer: Medicaid Other | Admitting: Physical Therapy

## 2022-12-03 ENCOUNTER — Ambulatory Visit: Payer: Medicaid Other | Admitting: Radiation Oncology

## 2022-12-04 ENCOUNTER — Telehealth: Payer: Self-pay | Admitting: *Deleted

## 2022-12-04 ENCOUNTER — Other Ambulatory Visit: Payer: Self-pay

## 2022-12-04 ENCOUNTER — Ambulatory Visit
Admission: RE | Admit: 2022-12-04 | Discharge: 2022-12-04 | Disposition: A | Discharge status: Home / Self Care | Payer: Medicaid Other | Source: Ambulatory Visit | Attending: Hematology and Oncology | Admitting: Hematology and Oncology

## 2022-12-04 ENCOUNTER — Other Ambulatory Visit: Payer: Self-pay | Admitting: *Deleted

## 2022-12-04 ENCOUNTER — Inpatient Hospital Stay: Admission: RE | Admit: 2022-12-04 | Payer: Medicaid Other | Source: Ambulatory Visit

## 2022-12-04 DIAGNOSIS — C7951 Secondary malignant neoplasm of bone: Secondary | ICD-10-CM

## 2022-12-04 DIAGNOSIS — M7989 Other specified soft tissue disorders: Secondary | ICD-10-CM | POA: Diagnosis present

## 2022-12-04 DIAGNOSIS — Z170 Estrogen receptor positive status [ER+]: Secondary | ICD-10-CM

## 2022-12-04 MED ORDER — FUROSEMIDE 20 MG PO TABS: 20.0000 mg | ORAL_TABLET | Freq: Every day | ORAL | 0 refills | Status: AC

## 2022-12-04 NOTE — Telephone Encounter (Addendum)
Pt left VM stating " my feet are swollen - do not know if there is something I could take to help them out?  This RN reviewed with MD with recommendation for pt to obtain dopplers due to being at high risk for blood clots - then if negative can proceed with low dose lasix.  This RN scheduled pt at [facility] - for 3pm today.  Pt aware as well as orders given per results if needed.

## 2022-12-04 NOTE — Telephone Encounter (Signed)
This RN obtained results of doppler showing negative for any DVTs.  Called pt and discussed prescription for use of lasix including side effects and if needed to take 1/2 tablet.  Pt verbalized understanding.  Pharmacy verified prescription sent

## 2022-12-07 ENCOUNTER — Inpatient Hospital Stay: Payer: Medicaid Other | Admitting: Dietician

## 2022-12-07 ENCOUNTER — Telehealth: Payer: Self-pay | Admitting: Dietician

## 2022-12-07 ENCOUNTER — Ambulatory Visit: Payer: Commercial Managed Care - PPO | Admitting: Physical Therapy

## 2022-12-07 ENCOUNTER — Ambulatory Visit: Payer: Medicaid Other | Attending: Hematology and Oncology | Admitting: Physical Therapy

## 2022-12-07 DIAGNOSIS — M25612 Stiffness of left shoulder, not elsewhere classified: Secondary | ICD-10-CM | POA: Insufficient documentation

## 2022-12-07 DIAGNOSIS — R29898 Other symptoms and signs involving the musculoskeletal system: Secondary | ICD-10-CM | POA: Diagnosis present

## 2022-12-07 DIAGNOSIS — R262 Difficulty in walking, not elsewhere classified: Secondary | ICD-10-CM | POA: Insufficient documentation

## 2022-12-07 DIAGNOSIS — M542 Cervicalgia: Secondary | ICD-10-CM | POA: Insufficient documentation

## 2022-12-07 DIAGNOSIS — M25611 Stiffness of right shoulder, not elsewhere classified: Secondary | ICD-10-CM | POA: Insufficient documentation

## 2022-12-07 DIAGNOSIS — M436 Torticollis: Secondary | ICD-10-CM | POA: Insufficient documentation

## 2022-12-07 DIAGNOSIS — M25562 Pain in left knee: Secondary | ICD-10-CM | POA: Insufficient documentation

## 2022-12-07 DIAGNOSIS — M6281 Muscle weakness (generalized): Secondary | ICD-10-CM | POA: Insufficient documentation

## 2022-12-07 NOTE — Therapy (Unsigned)
OUTPATIENT PHYSICAL THERAPY NEURO EVALUATION   Patient Name: Kathryn Lucas MRN: [id number] DOB:1960/08/03, 62 y.o., female Today's Date: 12/07/2022   PCP: Ollen Bowl, MD  REFERRING PROVIDER: [name], MD   END OF SESSION:  PT End of Session - 12/07/22 1442     Visit Number 1    Number of Visits 24    Date for PT Re-Evaluation 03/01/23    PT Start Time 1445    PT Stop Time 1530    PT Time Calculation (min) 45 min    Equipment Utilized During Treatment Gait belt    Behavior During Therapy WFL for tasks assessed/performed             Past Medical History:  Diagnosis Date   Breast cancer (HCC)    left breast cancer   Cancer (HCC) 09/2017   left breast cancer   Family history of breast cancer    Hypothyroidism    Personal history of radiation therapy 2019   Thyroid disease    Past Surgical History:  Procedure Laterality Date   BREAST BIOPSY Left 2018   BREAST BIOPSY Left 2019   BREAST RECONSTRUCTION WITH PLACEMENT OF TISSUE EXPANDER AND ALLODERM Left 10/26/2017   Procedure: LEFT BREAST RECONSTRUCTION WITH PLACEMENT OF TISSUE EXPANDER AND ALLODERM;  Surgeon: Glenna Fellows, MD;  Location: [facility];  Service: Plastics;  Laterality: Left;   MASTECTOMY Left 2019   MASTECTOMY WITH RADIOACTIVE SEED GUIDED EXCISION AND AXILLARY SENTINEL LYMPH NODE BIOPSY Left 10/26/2017   Procedure: LEFT MASTECTOMY WITH SEED TARGETED  LEFT AXILLARY LYMPH NODE EXCISION AND LEFT SENTINEL LYMPH NODE BIOPSY;  Surgeon: Almond Lint, MD;  Location: [facility];  Service: General;  Laterality: Left;   TONSILLECTOMY     WISDOM TOOTH EXTRACTION     Patient Active Problem List   Diagnosis Date Noted   Metastatic 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) 10/14/2022   Elevated liver enzymes 10/14/2022   Unexplained weight loss 10/14/2022   Neoplasm related pain 10/14/2022   Metastatic malignant neoplasm (HCC) 12/19/2021   Family history of breast cancer    History of  therapeutic radiation 04/27/2018   Breast cancer, left breast (HCC) 10/26/2017   Malignant neoplasm of lower-outer quadrant of left breast of female, estrogen receptor positive (HCC) 07/15/2016   Plantar fasciitis 07/30/2015   Metatarsalgia 10/18/2014    ONSET DATE: September 2023   REFERRING DIAG:  Diagnosis  C79.51 (ICD-10-CM) - Secondary malignant neoplasm of bone    THERAPY DIAG:  Difficulty in walking, not elsewhere classified  Muscle weakness (generalized)  Decreased range of motion of right shoulder  Stiffness of cervical spine  Decreased range of motion of left shoulder  Painful cervical ROM  Acute pain of left knee  Leg weakness, bilateral  Rationale for Evaluation and Treatment: Rehabilitation  SUBJECTIVE:  SUBJECTIVE STATEMENT: Reports that she was in hospital at [facility]. Was kept for 3 days a d/c'ed. Reports multiple changes in medication to manage CA, states that there has been improvement in CA per scans. At least 1 fall 3-4 weeks prior to PT, tripping over [facility] box, no broken bones or hospitalization as a result of fall. Required radiation to neck for CA management, completed this    Pt accompanied by:  care giver   PERTINENT HISTORY:   Pt is 62 y.o. female that is seeking therapy for weakness of the R LE. Pt tripped over the threshold of her door back in September of 2023, and had a transverse fracture of the L patella when she fell on it. She was recently diagnosed with bone cancer back in October of 2023. Pt notes that she has more stiffness in the neck and pelvis region because the cancer is located in those locations.   Pt had exacerbation of s/s in June of 2024 with hospitalization and subsequent change in oncology treatment limiting ability to attend PT. Pt has  responded well to treatment and now able to attend PT.   PAIN:  Are you having pain? Yes: NPRS scale: 2/10 Pain location: neck and shoulders  Pain description: stiffness  Aggravating factors: standing  Relieving factors: small pillows   PRECAUTIONS: Cervical and Other: cervical for comfort   RED FLAGS: None   WEIGHT BEARING RESTRICTIONS: No  FALLS: Has patient fallen in last 6 months? Yes. Number of falls 1  LIVING ENVIRONMENT: Lives with: lives alone Lives in: House/apartment Stairs:  yes, but uses ramp  Has following equipment at home: Walker - 4 wheeled  PLOF: Needs assistance with ADLs and Needs assistance with homemaking  PATIENT GOALS: improve balance, strength and endurance. Stand at kitchen longer and   OBJECTIVE:   DIAGNOSTIC FINDINGS:   EXAM: BILATERAL LOWER EXTREMITY VENOUS DOPPLER ULTRASOUND IMPRESSION: Negative.  EXAM: CT HEAD WITHOUT AND WITH CONTRAST IMPRESSION: 1. Extensive osseous metastatic disease throughout the calvarium. Extraosseous soft tissue tumor in the right orbit appears improved, while the additional areas of extraosseous tumor described in detail above are otherwise overall not significantly changed. 2. Tumor is again inseparable from the anterior aspect of the superior sagittal sinus and left sigmoid sinus, unchanged. 3. Decreased but not resolved infiltrative thickening of the left inferior and medial rectus muscles. 4. Unchanged innumerable scalp nodules, nonspecific but possibly metastatic lesions.     COGNITION: Overall cognitive status: Within functional limits for tasks assessed   SENSATION: WFL  COORDINATION: Limited due to ROM deficits. WFL    MUSCLE TONE: WFL    POSTURE: rounded shoulders, forward head, and decreased lumbar lordosis  LOWER EXTREMITY MMT:   MMT  Right Eval Left Eval  Hip flexion 4 4  Hip extension    Hip abduction 4 4  Hip adduction 4+ 4+  Hip internal rotation    Hip external  rotation    Knee flexion 4 4  Knee extension 4+ 4+  Ankle dorsiflexion 5 5  Ankle plantarflexion 4 4  Ankle inversion    Ankle eversion     (Blank rows = not tested)  LOWER EXTREMITY ROM:     To be tested BLE and BUE   BED MOBILITY:  Sit to supine SBA Supine to sit SBA  TRANSFERS: Assistive device utilized: None  Sit to stand: Modified independence Stand to sit: Modified independence Chair to chair: Modified independence Floor: Modified independence   CURB:  Level of Assistance: CGA  Assistive device utilized:  [employer] Comments: step to pattern  STAIRS: Level of Assistance: CGA Stair Negotiation Technique: Step to Pattern with Bilateral Rails Number of Stairs: 4  Height of Stairs: 6  Comments: step to with BUE support   GAIT: Gait pattern: step through pattern and circumduction- Right Distance walked: 137ft Assistive device utilized: None Level of assistance: SBA Comments: mild R LE circumduction, but greatly improved since last bout of PT treatment.   FUNCTIONAL TESTS:  5 times sit to stand: 15.82 Timed up and go (TUG): 15.72 6 minute walk test: TBD 10 meter walk test: 10.225sec (0.68m/s)  Berg Balance Scale: 4656 Functional gait assessment: TBD.   PATIENT SURVEYS:  FOTO 63 ( goal 95)  TODAY'S TREATMENT:                                                                                                                              DATE: 12/07/2022  Eval     PATIENT EDUCATION: Education details: POC. Clinic orientation  Person educated: Patient and Caregiver MAry Education method: Explanation Education comprehension: verbalized understanding  HOME EXERCISE PROGRAM: To be given at next session   GOALS: Goals reviewed with patient? Yes  SHORT TERM GOALS: Target date: 01/13/2023    Patient will be independent in home exercise program to improve strength/mobility for better functional independence with ADLs. Baseline:to  be given at next session   Goal status: INITIAL   LONG TERM GOALS: Target date: 03/03/2023    Patient will increase FOTO score to equal to or greater than  68   to demonstrate statistically significant improvement in mobility and quality of life.  Baseline: 63  Goal status: INITIAL  2.  Patient (> 37 years old) will complete five times sit to stand test in < 15 seconds indicating an increased LE strength and improved balance. Baseline: 15.82 with UE support  Goal status: INITIAL  3.  Patient will increase Berg Balance score by > 6 points to demonstrate decreased fall risk during functional activities Baseline: 48 Goal status: INITIAL  4.  Patient will increase 6 min walk test y 132ft  >1.23m/s as to improve gait speed for better community ambulation and to reduce fall risk. Baseline: TBD Goal status: INITIAL  5.  Patient will reduce timed up and go to <11 seconds to reduce fall risk and demonstrate improved transfer/gait ability. Baseline: 15.72 Goal status: INITIAL  6.  Patient will increase FGA score to >22 as to demonstrate reduced fall risk and improved dynamic gait balance for better safety with community/home ambulation.   Baseline: TBD Goal status: INITIAL   ASSESSMENT:  CLINICAL IMPRESSION: Patient is a 62 y.o. Female who was seen today for physical therapy evaluation and treatment for balance, strength, and endurance deficits secondary to CA treatment and prolonged immobility. Pt demonstrates mild reduction in balance, strength, and endurance compared to last bout of PT treatment that ended in June 2024 due to CA exacerbation. Pt  demonstrating reduced balance and increased fall risk evidenced by Berg 48, 5xSTS 15.82 sec. TUG 15.72 and BLE weakness. Pt also demonstrates decreased ROm in BUE and cervical spine due to CA metastases. Pt will benefit from skilled PT to address balance, strength and ROM deficits, improve safety and QoL while continuing CA treatment.       OBJECTIVE IMPAIRMENTS:  Abnormal gait, cardiopulmonary status limiting activity, decreased activity tolerance, decreased balance, decreased endurance, decreased knowledge of condition, decreased mobility, difficulty walking, decreased ROM, decreased strength, hypomobility, increased fascial restrictions, impaired perceived functional ability, impaired flexibility, impaired UE functional use, improper body mechanics, postural dysfunction, and pain.   ACTIVITY LIMITATIONS: carrying, lifting, bending, standing, squatting, sleeping, stairs, transfers, bathing, toileting, dressing, reach over head, and locomotion level  PARTICIPATION LIMITATIONS: cleaning, laundry, driving, shopping, community activity, and yard work  PERSONAL FACTORS: Age, Behavior pattern, Fitness, Past/current experiences, and 1-2 comorbidities: CA with profound metastases   are also affecting patient's functional outcome.   REHAB POTENTIAL: Good  CLINICAL DECISION MAKING: Evolving/moderate complexity  EVALUATION COMPLEXITY: Moderate  PLAN:  PT FREQUENCY: 1-2x/week  PT DURATION: 12 weeks  PLANNED INTERVENTIONS: Therapeutic exercises, Therapeutic activity, Neuromuscular re-education, Balance training, Gait training, Patient/Family education, Self Care, Joint mobilization, Stair training, DME instructions, Cryotherapy, Moist heat, and Manual therapy  PLAN FOR NEXT SESSION:   UE ROM assessment 6 min walk test.  FGA.  Initiate HEP    Golden Pop, PT 12/07/2022, 2:43 PM

## 2022-12-07 NOTE — Telephone Encounter (Signed)
Received VM with request for call from patient. Spoke with pt via telephone. She reports dysphagia has resolved s/p radiation to "two tumors on my neck." She is tolerating whole foods without difficulty. Pt reports radiation "messed up" her voice. She is "squeaky." Pt requested referral to ST to help with this.   RD informed pt that she would need to contact provider with request. Pt understanding and appreciative of return call.

## 2022-12-09 ENCOUNTER — Ambulatory Visit: Payer: Commercial Managed Care - PPO | Admitting: Physical Therapy

## 2022-12-14 ENCOUNTER — Ambulatory Visit: Payer: Commercial Managed Care - PPO | Admitting: Physical Therapy

## 2022-12-14 ENCOUNTER — Ambulatory Visit: Payer: Medicaid Other | Admitting: Physical Therapy

## 2022-12-14 DIAGNOSIS — M25562 Pain in left knee: Secondary | ICD-10-CM

## 2022-12-14 DIAGNOSIS — M542 Cervicalgia: Secondary | ICD-10-CM

## 2022-12-14 DIAGNOSIS — M6281 Muscle weakness (generalized): Secondary | ICD-10-CM

## 2022-12-14 DIAGNOSIS — M25611 Stiffness of right shoulder, not elsewhere classified: Secondary | ICD-10-CM

## 2022-12-14 DIAGNOSIS — M25612 Stiffness of left shoulder, not elsewhere classified: Secondary | ICD-10-CM

## 2022-12-14 DIAGNOSIS — M436 Torticollis: Secondary | ICD-10-CM

## 2022-12-14 DIAGNOSIS — R262 Difficulty in walking, not elsewhere classified: Secondary | ICD-10-CM

## 2022-12-14 DIAGNOSIS — R29898 Other symptoms and signs involving the musculoskeletal system: Secondary | ICD-10-CM

## 2022-12-14 NOTE — Therapy (Signed)
OUTPATIENT PHYSICAL THERAPY NEURO EVALUATION   Patient Name: Kathryn Lucas MRN: [id number] DOB:Nov 12, 1960, 62 y.o., female Today's Date: 12/14/2022   PCP: Ollen Bowl, MD  REFERRING PROVIDER: [name], MD   END OF SESSION:  PT End of Session - 12/14/22 1016     Visit Number 2    Number of Visits 24    Date for PT Re-Evaluation 03/01/23    PT Start Time 1019    PT Stop Time 1100    PT Time Calculation (min) 41 min    Equipment Utilized During Treatment Gait belt    Behavior During Therapy WFL for tasks assessed/performed             Past Medical History:  Diagnosis Date   Breast cancer (HCC)    left breast cancer   Cancer (HCC) 09/2017   left breast cancer   Family history of breast cancer    Hypothyroidism    Personal history of radiation therapy 2019   Thyroid disease    Past Surgical History:  Procedure Laterality Date   BREAST BIOPSY Left 2018   BREAST BIOPSY Left 2019   BREAST RECONSTRUCTION WITH PLACEMENT OF TISSUE EXPANDER AND ALLODERM Left 10/26/2017   Procedure: LEFT BREAST RECONSTRUCTION WITH PLACEMENT OF TISSUE EXPANDER AND ALLODERM;  Surgeon: Glenna Fellows, MD;  Location: [facility];  Service: Plastics;  Laterality: Left;   MASTECTOMY Left 2019   MASTECTOMY WITH RADIOACTIVE SEED GUIDED EXCISION AND AXILLARY SENTINEL LYMPH NODE BIOPSY Left 10/26/2017   Procedure: LEFT MASTECTOMY WITH SEED TARGETED  LEFT AXILLARY LYMPH NODE EXCISION AND LEFT SENTINEL LYMPH NODE BIOPSY;  Surgeon: Almond Lint, MD;  Location:  SUR[facility]ERY CENTER;  Service: General;  Laterality: Left;   TONSILLECTOMY     WISDOM TOOTH EXTRACTION     Patient Active Problem List   Diagnosis Date Noted   Metastatic 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) 10/14/2022   Elevated liver enzymes 10/14/2022   Unexplained weight loss 10/14/2022   Neoplasm related pain 10/14/2022   Metastatic malignant neoplasm (HCC) 12/19/2021   Family history of breast cancer    History of  therapeutic radiation 04/27/2018   Breast cancer, left breast (HCC) 10/26/2017   Malignant neoplasm of lower-outer quadrant of left breast of female, estrogen receptor positive (HCC) 07/15/2016   Plantar fasciitis 07/30/2015   Metatarsalgia 10/18/2014    ONSET DATE: September 2023   REFERRING DIAG:  Diagnosis  C79.51 (ICD-10-CM) - Secondary malignant neoplasm of bone    THERAPY DIAG:  Difficulty in walking, not elsewhere classified  Muscle weakness (generalized)  Decreased range of motion of right shoulder  Stiffness of cervical spine  Decreased range of motion of left shoulder  Painful cervical ROM  Acute pain of left knee  Leg weakness, bilateral  Rationale for Evaluation and Treatment: Rehabilitation  SUBJECTIVE:  SUBJECTIVE STATEMENT: Pt states that she is doing well this morning. No pain to report, but reports mild shoulder and L knee stiffness. Pt does not rate level of stiffness.     Pt accompanied by:  care giver   PERTINENT HISTORY:   Pt is 62 y.o. female that is seeking therapy for weakness of the R LE. Pt tripped over the threshold of her door back in September of 2023, and had a transverse fracture of the L patella when she fell on it. She was recently diagnosed with bone cancer back in October of 2023. Pt notes that she has more stiffness in the neck and pelvis region because the cancer is located in those locations.   Pt had exacerbation of s/s in June of 2024 with hospitalization and subsequent change in oncology treatment limiting ability to attend PT. Pt has responded well to treatment and now able to attend PT.   PAIN:  Are you having pain? Yes: NPRS scale: 2/10 Pain location: neck and shoulders  Pain description: stiffness  Aggravating factors: standing   Relieving factors: small pillows   PRECAUTIONS: Cervical and Other: cervical for comfort   RED FLAGS: None   WEIGHT BEARING RESTRICTIONS: No  FALLS: Has patient fallen in last 6 months? Yes. Number of falls 1  LIVING ENVIRONMENT: Lives with: lives alone Lives in: House/apartment Stairs:  yes, but uses ramp  Has following equipment at home: Walker - 4 wheeled  PLOF: Needs assistance with ADLs and Needs assistance with homemaking  PATIENT GOALS: improve balance, strength and endurance. Stand at kitchen longer and   OBJECTIVE:   DIAGNOSTIC FINDINGS:   EXAM: BILATERAL LOWER EXTREMITY VENOUS DOPPLER ULTRASOUND IMPRESSION: Negative.  EXAM: CT HEAD WITHOUT AND WITH CONTRAST IMPRESSION: 1. Extensive osseous metastatic disease throughout the calvarium. Extraosseous soft tissue tumor in the right orbit appears improved, while the additional areas of extraosseous tumor described in detail above are otherwise overall not significantly changed. 2. Tumor is again inseparable from the anterior aspect of the superior sagittal sinus and left sigmoid sinus, unchanged. 3. Decreased but not resolved infiltrative thickening of the left inferior and medial rectus muscles. 4. Unchanged innumerable scalp nodules, nonspecific but possibly metastatic lesions.     COGNITION: Overall cognitive status: Within functional limits for tasks assessed   SENSATION: WFL  COORDINATION: Limited due to ROM deficits. WFL    MUSCLE TONE: WFL    POSTURE: rounded shoulders, forward head, and decreased lumbar lordosis  LOWER EXTREMITY MMT:   MMT  Right Eval Left Eval  Hip flexion 4 4  Hip extension    Hip abduction 4 4  Hip adduction 4+ 4+  Hip internal rotation    Hip external rotation    Knee flexion 4 4  Knee extension 4+ 4+  Ankle dorsiflexion 5 5  Ankle plantarflexion 4 4  Ankle inversion    Ankle eversion     (Blank rows = not tested)  LOWER EXTREMITY ROM:     To be  tested BLE and BUE   BED MOBILITY:  Sit to supine SBA Supine to sit SBA  TRANSFERS: Assistive device utilized: None  Sit to stand: Modified independence Stand to sit: Modified independence Chair to chair: Modified independence Floor: Modified independence   CURB:  Level of Assistance: CGA Assistive device utilized:  Youth worker Comments: step to pattern  STAIRS: Level of Assistance: CGA Stair Negotiation Technique: Step to Pattern with Bilateral Rails Number of Stairs: 4  Height of Stairs: 6  Comments:  step to with BUE support   GAIT: Gait pattern: step through pattern and circumduction- Right Distance walked: 118ft Assistive device utilized: None Level of assistance: SBA Comments: mild R LE circumduction, but greatly improved since last bout of PT treatment.   FUNCTIONAL TESTS:  5 times sit to stand: 15.82 Timed up and go (TUG): 15.72 6 minute walk test: TBD 10 meter walk test: 10.225sec (0.84m/s)  Berg Balance Scale: 4656 Functional gait assessment: TBD.   PATIENT SURVEYS:  FOTO 63 ( goal 68)  TODAY'S TREATMENT:                                                                                                                              DATE: 12/14/2022  Nustep level 1 x 2.5 min BLE only. Pt states that she will not be able to complete any additional time on nustep in fear of increased stiffness in the LLE,    PT applied Gait belt and CGA for safety throughout session, unless otherwise noted.    6 Min Walk Test:  Instructed patient to ambulate as quickly and as safely as possible for 6 minutes using LRAD. Patient was allowed to take standing rest breaks without stopping the test, but if the patient required a sitting rest break the clock would be stopped and the test would be over.  Results: 950 feet (using a no AD  with supervision assist; required 2 standing rest breaks. Results indicate that the patient has reduced endurance with ambulation compared to age  matched norms.  Age Matched Norms: 73-69 yo M: 19 F: 30, 44-79 yo M: 55 F: 471, 28-89 yo M: 417 F: 392 MDC: 58.21 meters (190.98 feet) or 50 meters (ANPTA Core Set of Outcome Measures for Adults with Neurologic Conditions, 2018)  SpO2 98% HR 117   Patient demonstrates increased fall risk as noted by score of 19/30 on  Functional Gait Assessment.   <22/30 = predictive of falls, <20/30 = fall in 6 months, <18/30 = predictive of falls in PD MCID: 5 points stroke population, 4 points geriatric population (ANPTA Core Set of Outcome Measures for Adults with Neurologic Conditions, 2018)  HR 139  PT instructed pt in HEP as listed below at rail Mini squat x 10  Standing march x 10  Standing hip abduction x 10 LAQ x 10 bil with 3 sec hold   HR 120  PATIENT EDUCATION: Education details: POC. Clinic orientation. Pt educated throughout session about proper posture and technique with exercises. Improved exercise technique, movement at target joints, use of target muscles after min to mod verbal, visual, tactile cues.  Person educated: Patient and Caregiver MAry Education method: Explanation Education comprehension: verbalized understanding  HOME EXERCISE PROGRAM: Access Code: [email or web address] URL: [email or web address] Date: 12/14/2022 Prepared by: Grier Rocher  Exercises - Mini Squat with Counter Support  - 1 x daily - 7 x weekly - 3 sets - 10 reps - Standing Hip Abduction with Counter Support  -  1 x daily - 7 x weekly - 3 sets - 10 reps - Standing March with Counter Support  - 1 x daily - 7 x weekly - 3 sets - 10 reps - Seated Long Arc Quad  - 1 x daily - 7 x weekly - 3 sets - 10 reps  GOALS: Goals reviewed with patient? Yes  SHORT TERM GOALS: Target date: 01/13/2023    Patient will be independent in home exercise program to improve strength/mobility for better functional independence with ADLs. Baseline:to  be given at next session  Goal status: INITIAL   LONG TERM  GOALS: Target date: 03/03/2023    Patient will increase FOTO score to equal to or greater than  68   to demonstrate statistically significant improvement in mobility and quality of life.  Baseline: 63  Goal status: INITIAL  2.  Patient (> 50 years old) will complete five times sit to stand test in < 15 seconds indicating an increased LE strength and improved balance. Baseline: 15.82 with UE support  Goal status: INITIAL  3.  Patient will increase Berg Balance score by > 6 points to demonstrate decreased fall risk during functional activities Baseline: 48 Goal status: INITIAL  4.  Patient will increase 6 min walk test by 127ft  >1.59m/s as to improve gait speed for better community ambulation and to reduce fall risk. Baseline: 950 Goal status: INITIAL  5.  Patient will reduce timed up and go to <11 seconds to reduce fall risk and demonstrate improved transfer/gait ability. Baseline: 15.72 Goal status: INITIAL  6.  Patient will increase FGA score to >22 as to demonstrate reduced fall risk and improved dynamic gait balance for better safety with community/home ambulation.   Baseline:19 Goal status: INITIAL   ASSESSMENT:  CLINICAL IMPRESSION: Patient is a 62 y.o. Female who was seen today for physical therapy treatment for balance, strength, and endurance deficits secondary to CA treatment and prolonged immobility. PT treatment instructed by PT to complete Assessment and set LTG, demonstrates reduce community mobility as well as reduced safety and balance with reduced FGA to 19/30 and 6 min walk test of 968ft. HEP initiated. Will need to re-assess at subsequent visits to ensure proper technique. Pt will benefit from skilled PT to address balance, strength and ROM deficits, improve safety and QoL while continuing CA treatment.       OBJECTIVE IMPAIRMENTS: Abnormal gait, cardiopulmonary status limiting activity, decreased activity tolerance, decreased balance, decreased endurance,  decreased knowledge of condition, decreased mobility, difficulty walking, decreased ROM, decreased strength, hypomobility, increased fascial restrictions, impaired perceived functional ability, impaired flexibility, impaired UE functional use, improper body mechanics, postural dysfunction, and pain.   ACTIVITY LIMITATIONS: carrying, lifting, bending, standing, squatting, sleeping, stairs, transfers, bathing, toileting, dressing, reach over head, and locomotion level  PARTICIPATION LIMITATIONS: cleaning, laundry, driving, shopping, community activity, and yard work  PERSONAL FACTORS: Age, Behavior pattern, Fitness, Past/current experiences, and 1-2 comorbidities: CA with profound metastases   are also affecting patient's functional outcome.   REHAB POTENTIAL: Good  CLINICAL DECISION MAKING: Evolving/moderate complexity  EVALUATION COMPLEXITY: Moderate  PLAN:  PT FREQUENCY: 1-2x/week  PT DURATION: 12 weeks  PLANNED INTERVENTIONS: Therapeutic exercises, Therapeutic activity, Neuromuscular re-education, Balance training, Gait training, Patient/Family education, Self Care, Joint mobilization, Stair training, DME instructions, Cryotherapy, Moist heat, and Manual therapy  PLAN FOR NEXT SESSION:   Re-assess HEP.  UE ROM BLE and balance training.    Golden Pop, PT 12/14/2022, 10:17 AM

## 2022-12-16 ENCOUNTER — Ambulatory Visit: Payer: Commercial Managed Care - PPO | Admitting: Physical Therapy

## 2022-12-17 ENCOUNTER — Ambulatory Visit: Payer: Medicaid Other | Attending: Hematology and Oncology | Admitting: Physical Therapy

## 2022-12-17 DIAGNOSIS — M436 Torticollis: Secondary | ICD-10-CM | POA: Diagnosis present

## 2022-12-17 DIAGNOSIS — R29898 Other symptoms and signs involving the musculoskeletal system: Secondary | ICD-10-CM | POA: Insufficient documentation

## 2022-12-17 DIAGNOSIS — M25562 Pain in left knee: Secondary | ICD-10-CM | POA: Diagnosis present

## 2022-12-17 DIAGNOSIS — M6281 Muscle weakness (generalized): Secondary | ICD-10-CM | POA: Insufficient documentation

## 2022-12-17 DIAGNOSIS — M25612 Stiffness of left shoulder, not elsewhere classified: Secondary | ICD-10-CM | POA: Diagnosis present

## 2022-12-17 DIAGNOSIS — M25611 Stiffness of right shoulder, not elsewhere classified: Secondary | ICD-10-CM | POA: Diagnosis present

## 2022-12-17 DIAGNOSIS — R262 Difficulty in walking, not elsewhere classified: Secondary | ICD-10-CM | POA: Diagnosis present

## 2022-12-17 DIAGNOSIS — M542 Cervicalgia: Secondary | ICD-10-CM | POA: Diagnosis present

## 2022-12-17 NOTE — Therapy (Signed)
OUTPATIENT PHYSICAL THERAPY NEURO EVALUATION   Patient Name: Kathryn Lucas MRN: [id number] DOB:30-Jun-1960, 62 y.o., female Today's Date: 12/17/2022   PCP: Ollen Bowl, MD  REFERRING PROVIDER: [name], MD   END OF SESSION:  PT End of Session - 12/17/22 1404     Visit Number 3    Number of Visits 24    Date for PT Re-Evaluation 03/01/23    PT Start Time 1405    PT Stop Time 1445    PT Time Calculation (min) 40 min    Equipment Utilized During Treatment Gait belt    Behavior During Therapy WFL for tasks assessed/performed             Past Medical History:  Diagnosis Date   Breast cancer (HCC)    left breast cancer   Cancer (HCC) 09/2017   left breast cancer   Family history of breast cancer    Hypothyroidism    Personal history of radiation therapy 2019   Thyroid disease    Past Surgical History:  Procedure Laterality Date   BREAST BIOPSY Left 2018   BREAST BIOPSY Left 2019   BREAST RECONSTRUCTION WITH PLACEMENT OF TISSUE EXPANDER AND ALLODERM Left 10/26/2017   Procedure: LEFT BREAST RECONSTRUCTION WITH PLACEMENT OF TISSUE EXPANDER AND ALLODERM;  Surgeon: Glenna Fellows, MD;  Location: [facility];  Service: Plastics;  Laterality: Left;   MASTECTOMY Left 2019   MASTECTOMY WITH RADIOACTIVE SEED GUIDED EXCISION AND AXILLARY SENTINEL LYMPH NODE BIOPSY Left 10/26/2017   Procedure: LEFT MASTECTOMY WITH SEED TARGETED  LEFT AXILLARY LYMPH NODE EXCISION AND LEFT SENTINEL LYMPH NODE BIOPSY;  Surgeon: Almond Lint, MD;  Location: [facility];  Service: General;  Laterality: Left;   TONSILLECTOMY     WISDOM TOOTH EXTRACTION     Patient Active Problem List   Diagnosis Date Noted   Metastatic 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) 10/14/2022   Elevated liver enzymes 10/14/2022   Unexplained weight loss 10/14/2022   Neoplasm related pain 10/14/2022   Metastatic malignant neoplasm (HCC) 12/19/2021   Family history of breast cancer    History of  therapeutic radiation 04/27/2018   Breast cancer, left breast (HCC) 10/26/2017   Malignant neoplasm of lower-outer quadrant of left breast of female, estrogen receptor positive (HCC) 07/15/2016   Plantar fasciitis 07/30/2015   Metatarsalgia 10/18/2014    ONSET DATE: September 2023   REFERRING DIAG:  Diagnosis  C79.51 (ICD-10-CM) - Secondary malignant neoplasm of bone    THERAPY DIAG:  Difficulty in walking, not elsewhere classified  Muscle weakness (generalized)  Decreased range of motion of right shoulder  Stiffness of cervical spine  Decreased range of motion of left shoulder  Painful cervical ROM  Acute pain of left knee  Leg weakness, bilateral  Rationale for Evaluation and Treatment: Rehabilitation  SUBJECTIVE:  SUBJECTIVE STATEMENT: Pt states that she is doing well. Reports that she was very tired after last PT session, but no soreness reported.  States that her shoulder is also feeling less sore.   Pt accompanied by:  care giver   PERTINENT HISTORY:   Pt is 62 y.o. female that is seeking therapy for weakness of the R LE. Pt tripped over the threshold of her door back in September of 2023, and had a transverse fracture of the L patella when she fell on it. She was recently diagnosed with bone cancer back in October of 2023. Pt notes that she has more stiffness in the neck and pelvis region because the cancer is located in those locations.   Pt had exacerbation of s/s in June of 2024 with hospitalization and subsequent change in oncology treatment limiting ability to attend PT. Pt has responded well to treatment and now able to attend PT.   PAIN:  Are you having pain? Yes: NPRS scale: 2/10 Pain location: neck and shoulders  Pain description: stiffness  Aggravating factors:  standing  Relieving factors: small pillows   PRECAUTIONS: Cervical and Other: cervical for comfort   RED FLAGS: None   WEIGHT BEARING RESTRICTIONS: No  FALLS: Has patient fallen in last 6 months? Yes. Number of falls 1  LIVING ENVIRONMENT: Lives with: lives alone Lives in: House/apartment Stairs:  yes, but uses ramp  Has following equipment at home: Walker - 4 wheeled  PLOF: Needs assistance with ADLs and Needs assistance with homemaking  PATIENT GOALS: improve balance, strength and endurance. Stand at kitchen longer and   OBJECTIVE:   DIAGNOSTIC FINDINGS:   EXAM: BILATERAL LOWER EXTREMITY VENOUS DOPPLER ULTRASOUND IMPRESSION: Negative.  EXAM: CT HEAD WITHOUT AND WITH CONTRAST IMPRESSION: 1. Extensive osseous metastatic disease throughout the calvarium. Extraosseous soft tissue tumor in the right orbit appears improved, while the additional areas of extraosseous tumor described in detail above are otherwise overall not significantly changed. 2. Tumor is again inseparable from the anterior aspect of the superior sagittal sinus and left sigmoid sinus, unchanged. 3. Decreased but not resolved infiltrative thickening of the left inferior and medial rectus muscles. 4. Unchanged innumerable scalp nodules, nonspecific but possibly metastatic lesions.     COGNITION: Overall cognitive status: Within functional limits for tasks assessed   SENSATION: WFL  COORDINATION: Limited due to ROM deficits. WFL    MUSCLE TONE: WFL    POSTURE: rounded shoulders, forward head, and decreased lumbar lordosis  LOWER EXTREMITY MMT:   MMT  Right Eval Left Eval  Hip flexion 4 4  Hip extension    Hip abduction 4 4  Hip adduction 4+ 4+  Hip internal rotation    Hip external rotation    Knee flexion 4 4  Knee extension 4+ 4+  Ankle dorsiflexion 5 5  Ankle plantarflexion 4 4  Ankle inversion    Ankle eversion     (Blank rows = not tested)  LOWER EXTREMITY ROM:     To  be tested BLE and BUE   BED MOBILITY:  Sit to supine SBA Supine to sit SBA  TRANSFERS: Assistive device utilized: None  Sit to stand: Modified independence Stand to sit: Modified independence Chair to chair: Modified independence Floor: Modified independence   CURB:  Level of Assistance: CGA Assistive device utilized:  Youth worker Comments: step to pattern  STAIRS: Level of Assistance: CGA Stair Negotiation Technique: Step to Pattern with Bilateral Rails Number of Stairs: 4  Height of Stairs: 6  Comments: step to with BUE support   GAIT: Gait pattern: step through pattern and circumduction- Right Distance walked: 188ft Assistive device utilized: None Level of assistance: SBA Comments: mild R LE circumduction, but greatly improved since last bout of PT treatment.   FUNCTIONAL TESTS:  5 times sit to stand: 15.82 Timed up and go (TUG): 15.72 6 minute walk test: TBD 10 meter walk test: 10.225sec (0.33m/s)  Berg Balance Scale: 4656 Functional gait assessment: TBD.   PATIENT SURVEYS:  FOTO 63 ( goal 68)  TODAY'S TREATMENT:                                                                                                                              DATE: 12/17/2022  Nustep level 1-2 5 min BLE and LUE. Unable to perform with RUE due to stiffness and ROM restrictions.   PT applied Gait belt and CGA for safety throughout session, unless otherwise noted.   Gait through rehab gym without resistance 2 x 66ft with out assist from PT. Pt's gait has noticeably improved with significant gait pattern with increased step length and reduced trunkal sway.   Step up/down 6 inch step x 10 bil  Lateral step up/down x 10 bil   Forward/reverse gait with 3# ankle weights 37ft x 4 with improved step length on each bout  Step over hurdle with 3# ankle weights 2 x 10 bil   Sit<>stand x 8 without UE support and 2 of pt's cushions on seat.   Throughout session therapeutic rest break provided  due to BLE fatigue and mild SOB. After short rest breaks, pt able to continue.    PATIENT EDUCATION: Education details: POC. Clinic orientation. Pt educated throughout session about proper posture and technique with exercises. Improved exercise technique, movement at target joints, use of target muscles after min to mod verbal, visual, tactile cues.  Person educated: Patient and Caregiver MAry Education method: Explanation Education comprehension: verbalized understanding  HOME EXERCISE PROGRAM: Access Code: [email or web address] URL: [email or web address] Date: 12/14/2022 Prepared by: Grier Rocher  Exercises - Mini Squat with Counter Support  - 1 x daily - 7 x weekly - 3 sets - 10 reps - Standing Hip Abduction with Counter Support  - 1 x daily - 7 x weekly - 3 sets - 10 reps - Standing March with Counter Support  - 1 x daily - 7 x weekly - 3 sets - 10 reps - Seated Long Arc Quad  - 1 x daily - 7 x weekly - 3 sets - 10 reps  GOALS: Goals reviewed with patient? Yes  SHORT TERM GOALS: Target date: 01/13/2023    Patient will be independent in home exercise program to improve strength/mobility for better functional independence with ADLs. Baseline:to  be given at next session  Goal status: INITIAL   LONG TERM GOALS: Target date: 03/03/2023    Patient will increase FOTO score to equal to or greater than  68   to  demonstrate statistically significant improvement in mobility and quality of life.  Baseline: 63  Goal status: INITIAL  2.  Patient (> 53 years old) will complete five times sit to stand test in < 15 seconds indicating an increased LE strength and improved balance. Baseline: 15.82 with UE support  Goal status: INITIAL  3.  Patient will increase Berg Balance score by > 6 points to demonstrate decreased fall risk during functional activities Baseline: 48 Goal status: INITIAL  4.  Patient will increase 6 min walk test by 138ft  >1.28m/s as to improve gait speed for  better community ambulation and to reduce fall risk. Baseline: 950 Goal status: INITIAL  5.  Patient will reduce timed up and go to <11 seconds to reduce fall risk and demonstrate improved transfer/gait ability. Baseline: 15.72 Goal status: INITIAL  6.  Patient will increase FGA score to >22 as to demonstrate reduced fall risk and improved dynamic gait balance for better safety with community/home ambulation.   Baseline:19 Goal status: INITIAL   ASSESSMENT:  CLINICAL IMPRESSION: Patient is a 62 y.o. Female who was seen today for physical therapy treatment for balance, strength, and endurance deficits secondary to CA treatment and prolonged immobility. PT treatment focused on improved functional strength and stair management and obstacle navigation with blocked practice. Improved tolerance to endurance training and posture with functional mobility noted compared to prior session. Pt will benefit from skilled PT to address balance, strength and ROM deficits, improve safety and QoL while continuing CA treatment.       OBJECTIVE IMPAIRMENTS: Abnormal gait, cardiopulmonary status limiting activity, decreased activity tolerance, decreased balance, decreased endurance, decreased knowledge of condition, decreased mobility, difficulty walking, decreased ROM, decreased strength, hypomobility, increased fascial restrictions, impaired perceived functional ability, impaired flexibility, impaired UE functional use, improper body mechanics, postural dysfunction, and pain.   ACTIVITY LIMITATIONS: carrying, lifting, bending, standing, squatting, sleeping, stairs, transfers, bathing, toileting, dressing, reach over head, and locomotion level  PARTICIPATION LIMITATIONS: cleaning, laundry, driving, shopping, community activity, and yard work  PERSONAL FACTORS: Age, Behavior pattern, Fitness, Past/current experiences, and 1-2 comorbidities: CA with profound metastases   are also affecting patient's functional  outcome.   REHAB POTENTIAL: Good  CLINICAL DECISION MAKING: Evolving/moderate complexity  EVALUATION COMPLEXITY: Moderate  PLAN:  PT FREQUENCY: 1-2x/week  PT DURATION: 12 weeks  PLANNED INTERVENTIONS: Therapeutic exercises, Therapeutic activity, Neuromuscular re-education, Balance training, Gait training, Patient/Family education, Self Care, Joint mobilization, Stair training, DME instructions, Cryotherapy, Moist heat, and Manual therapy  PLAN FOR NEXT SESSION:    UE ROM/cervical as tolerated BLE and balance training.    Golden Pop, PT 12/17/2022, 4:32 PM

## 2022-12-21 ENCOUNTER — Ambulatory Visit: Payer: Medicaid Other | Admitting: Physical Therapy

## 2022-12-21 ENCOUNTER — Ambulatory Visit: Payer: Commercial Managed Care - PPO | Admitting: Physical Therapy

## 2022-12-21 DIAGNOSIS — M6281 Muscle weakness (generalized): Secondary | ICD-10-CM

## 2022-12-21 DIAGNOSIS — M25562 Pain in left knee: Secondary | ICD-10-CM

## 2022-12-21 DIAGNOSIS — M436 Torticollis: Secondary | ICD-10-CM

## 2022-12-21 DIAGNOSIS — R29898 Other symptoms and signs involving the musculoskeletal system: Secondary | ICD-10-CM

## 2022-12-21 DIAGNOSIS — R262 Difficulty in walking, not elsewhere classified: Secondary | ICD-10-CM | POA: Diagnosis not present

## 2022-12-21 DIAGNOSIS — M25612 Stiffness of left shoulder, not elsewhere classified: Secondary | ICD-10-CM

## 2022-12-21 DIAGNOSIS — M25611 Stiffness of right shoulder, not elsewhere classified: Secondary | ICD-10-CM

## 2022-12-21 DIAGNOSIS — M542 Cervicalgia: Secondary | ICD-10-CM

## 2022-12-21 NOTE — Therapy (Signed)
OUTPATIENT PHYSICAL THERAPY NEURO EVALUATION   Patient Name: Kathryn Lucas MRN: [id number] DOB:09/30/1960, 62 y.o., female Today's Date: 12/21/2022   PCP: Ollen Bowl, MD  REFERRING PROVIDER: [name], MD   END OF SESSION:  PT End of Session - 12/21/22 1337     Visit Number 4    Number of Visits 24    Date for PT Re-Evaluation 03/01/23    PT Start Time 1318    PT Stop Time 1400    PT Time Calculation (min) 42 min    Equipment Utilized During Treatment Gait belt    Behavior During Therapy WFL for tasks assessed/performed             Past Medical History:  Diagnosis Date   Breast cancer (HCC)    left breast cancer   Cancer (HCC) 09/2017   left breast cancer   Family history of breast cancer    Hypothyroidism    Personal history of radiation therapy 2019   Thyroid disease    Past Surgical History:  Procedure Laterality Date   BREAST BIOPSY Left 2018   BREAST BIOPSY Left 2019   BREAST RECONSTRUCTION WITH PLACEMENT OF TISSUE EXPANDER AND ALLODERM Left 10/26/2017   Procedure: LEFT BREAST RECONSTRUCTION WITH PLACEMENT OF TISSUE EXPANDER AND ALLODERM;  Surgeon: Glenna Fellows, MD;  Location: [facility];  Service: Plastics;  Laterality: Left;   MASTECTOMY Left 2019   MASTECTOMY WITH RADIOACTIVE SEED GUIDED EXCISION AND AXILLARY SENTINEL LYMPH NODE BIOPSY Left 10/26/2017   Procedure: LEFT MASTECTOMY WITH SEED TARGETED  LEFT AXILLARY LYMPH NODE EXCISION AND LEFT SENTINEL LYMPH NODE BIOPSY;  Surgeon: Almond Lint, MD;  Location: [facility];  Service: General;  Laterality: Left;   TONSILLECTOMY     WISDOM TOOTH EXTRACTION     Patient Active Problem List   Diagnosis Date Noted   Metastatic 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) 10/14/2022   Elevated liver enzymes 10/14/2022   Unexplained weight loss 10/14/2022   Neoplasm related pain 10/14/2022   Metastatic malignant neoplasm (HCC) 12/19/2021   Family history of breast cancer    History of  therapeutic radiation 04/27/2018   Breast cancer, left breast (HCC) 10/26/2017   Malignant neoplasm of lower-outer quadrant of left breast of female, estrogen receptor positive (HCC) 07/15/2016   Plantar fasciitis 07/30/2015   Metatarsalgia 10/18/2014    ONSET DATE: September 2023   REFERRING DIAG:  Diagnosis  C79.51 (ICD-10-CM) - Secondary malignant neoplasm of bone    THERAPY DIAG:  Difficulty in walking, not elsewhere classified  Muscle weakness (generalized)  Decreased range of motion of right shoulder  Stiffness of cervical spine  Decreased range of motion of left shoulder  Painful cervical ROM  Acute pain of left knee  Leg weakness, bilateral  Rationale for Evaluation and Treatment: Rehabilitation  SUBJECTIVE:  SUBJECTIVE STATEMENT: Pt states that she is doing well. Reports that she was very tired after last PT session, but no soreness reported.  States that her shoulder is also feeling less sore.   Pt accompanied by:  care giver   PERTINENT HISTORY:   Pt is 62 y.o. female that is seeking therapy for weakness of the R LE. Pt tripped over the threshold of her door back in September of 2023, and had a transverse fracture of the L patella when she fell on it. She was recently diagnosed with bone cancer back in October of 2023. Pt notes that she has more stiffness in the neck and pelvis region because the cancer is located in those locations.   Pt had exacerbation of s/s in June of 2024 with hospitalization and subsequent change in oncology treatment limiting ability to attend PT. Pt has responded well to treatment and now able to attend PT.   PAIN:  Are you having pain? Yes: NPRS scale: 2/10 Pain location: neck and shoulders  Pain description: stiffness  Aggravating factors:  standing  Relieving factors: small pillows   PRECAUTIONS: Cervical and Other: cervical for comfort   RED FLAGS: None   WEIGHT BEARING RESTRICTIONS: No  FALLS: Has patient fallen in last 6 months? Yes. Number of falls 1  LIVING ENVIRONMENT: Lives with: lives alone Lives in: House/apartment Stairs:  yes, but uses ramp  Has following equipment at home: Walker - 4 wheeled  PLOF: Needs assistance with ADLs and Needs assistance with homemaking  PATIENT GOALS: improve balance, strength and endurance. Stand at kitchen longer and   OBJECTIVE:   DIAGNOSTIC FINDINGS:   EXAM: BILATERAL LOWER EXTREMITY VENOUS DOPPLER ULTRASOUND IMPRESSION: Negative.  EXAM: CT HEAD WITHOUT AND WITH CONTRAST IMPRESSION: 1. Extensive osseous metastatic disease throughout the calvarium. Extraosseous soft tissue tumor in the right orbit appears improved, while the additional areas of extraosseous tumor described in detail above are otherwise overall not significantly changed. 2. Tumor is again inseparable from the anterior aspect of the superior sagittal sinus and left sigmoid sinus, unchanged. 3. Decreased but not resolved infiltrative thickening of the left inferior and medial rectus muscles. 4. Unchanged innumerable scalp nodules, nonspecific but possibly metastatic lesions.     COGNITION: Overall cognitive status: Within functional limits for tasks assessed   SENSATION: WFL  COORDINATION: Limited due to ROM deficits. WFL    MUSCLE TONE: WFL    POSTURE: rounded shoulders, forward head, and decreased lumbar lordosis  LOWER EXTREMITY MMT:   MMT  Right Eval Left Eval  Hip flexion 4 4  Hip extension    Hip abduction 4 4  Hip adduction 4+ 4+  Hip internal rotation    Hip external rotation    Knee flexion 4 4  Knee extension 4+ 4+  Ankle dorsiflexion 5 5  Ankle plantarflexion 4 4  Ankle inversion    Ankle eversion     (Blank rows = not tested)  LOWER EXTREMITY ROM:     To  be tested BLE and BUE   BED MOBILITY:  Sit to supine SBA Supine to sit SBA  TRANSFERS: Assistive device utilized: None  Sit to stand: Modified independence Stand to sit: Modified independence Chair to chair: Modified independence Floor: Modified independence   CURB:  Level of Assistance: CGA Assistive device utilized:  Youth worker Comments: step to pattern  STAIRS: Level of Assistance: CGA Stair Negotiation Technique: Step to Pattern with Bilateral Rails Number of Stairs: 4  Height of Stairs: 6  Comments: step to with BUE support   GAIT: Gait pattern: step through pattern and circumduction- Right Distance walked: 11ft Assistive device utilized: None Level of assistance: SBA Comments: mild R LE circumduction, but greatly improved since last bout of PT treatment.   FUNCTIONAL TESTS:  5 times sit to stand: 15.82 Timed up and go (TUG): 15.72 6 minute walk test: TBD 10 meter walk test: 10.225sec (0.46m/s)  Berg Balance Scale: 4656 Functional gait assessment: TBD.   PATIENT SURVEYS:  FOTO 63 ( goal 68)  TODAY'S TREATMENT:                                                                                                                              DATE: 12/21/2022  Nustep level 1-2, BUE/BLE x 3 min then BLE only x 3 min  mild L knee and R shoulder pain reported after first 3 min.   PT applied Gait belt and CGA for safety throughout session, unless otherwise noted.   Standing at rail:  Hip abduction x 12 Mini squat x15 Hip extension x 10  Heel raise x 20  Toe raise x 20  Stepping over bolster forward x 12 Lateral stepping over bolster x 12   Gait without resistance x 38ft with cues for increased gait speed as tolerated.   Throughout session , pt required multiple seated rest breaks due to BLE fatigue, but recoverd quickly and able to continue without issue.    PATIENT EDUCATION: Education details: POC. Clinic orientation. Pt educated throughout session about  proper posture and technique with exercises. Improved exercise technique, movement at target joints, use of target muscles after min to mod verbal, visual, tactile cues.  Person educated: Patient and Caregiver MAry Education method: Explanation Education comprehension: verbalized understanding  HOME EXERCISE PROGRAM: Access Code: [email or web address] URL: [email or web address] Date: 12/14/2022 Prepared by: Grier Rocher  Exercises - Mini Squat with Counter Support  - 1 x daily - 7 x weekly - 3 sets - 10 reps - Standing Hip Abduction with Counter Support  - 1 x daily - 7 x weekly - 3 sets - 10 reps - Standing March with Counter Support  - 1 x daily - 7 x weekly - 3 sets - 10 reps - Seated Long Arc Quad  - 1 x daily - 7 x weekly - 3 sets - 10 reps  GOALS: Goals reviewed with patient? Yes  SHORT TERM GOALS: Target date: 01/13/2023    Patient will be independent in home exercise program to improve strength/mobility for better functional independence with ADLs. Baseline:to  be given at next session  Goal status: INITIAL   LONG TERM GOALS: Target date: 03/03/2023    Patient will increase FOTO score to equal to or greater than  68   to demonstrate statistically significant improvement in mobility and quality of life.  Baseline: 63  Goal status: INITIAL  2.  Patient (> 73 years old) will complete five times sit to stand  test in < 15 seconds indicating an increased LE strength and improved balance. Baseline: 15.82 with UE support  Goal status: INITIAL  3.  Patient will increase Berg Balance score by > 6 points to demonstrate decreased fall risk during functional activities Baseline: 48 Goal status: INITIAL  4.  Patient will increase 6 min walk test by 164ft  >1.55m/s as to improve gait speed for better community ambulation and to reduce fall risk. Baseline: 950 Goal status: INITIAL  5.  Patient will reduce timed up and go to <11 seconds to reduce fall risk and demonstrate  improved transfer/gait ability. Baseline: 15.72 Goal status: INITIAL  6.  Patient will increase FGA score to >22 as to demonstrate reduced fall risk and improved dynamic gait balance for better safety with community/home ambulation.   Baseline:19 Goal status: INITIAL   ASSESSMENT:  CLINICAL IMPRESSION: Patient is a 62 y.o. Female who was seen today for physical therapy treatment for balance, strength, and endurance deficits secondary to CA treatment and prolonged immobility. PT treatment focused on BLE strength to address balance and mobility deficits.pt able to complete all therex with multiple seated rest breaks and min cues for improved technique.  Pt will benefit from skilled PT to address balance, strength and ROM deficits, improve safety and QoL while continuing CA treatment.       OBJECTIVE IMPAIRMENTS: Abnormal gait, cardiopulmonary status limiting activity, decreased activity tolerance, decreased balance, decreased endurance, decreased knowledge of condition, decreased mobility, difficulty walking, decreased ROM, decreased strength, hypomobility, increased fascial restrictions, impaired perceived functional ability, impaired flexibility, impaired UE functional use, improper body mechanics, postural dysfunction, and pain.   ACTIVITY LIMITATIONS: carrying, lifting, bending, standing, squatting, sleeping, stairs, transfers, bathing, toileting, dressing, reach over head, and locomotion level  PARTICIPATION LIMITATIONS: cleaning, laundry, driving, shopping, community activity, and yard work  PERSONAL FACTORS: Age, Behavior pattern, Fitness, Past/current experiences, and 1-2 comorbidities: CA with profound metastases   are also affecting patient's functional outcome.   REHAB POTENTIAL: Good  CLINICAL DECISION MAKING: Evolving/moderate complexity  EVALUATION COMPLEXITY: Moderate  PLAN:  PT FREQUENCY: 1-2x/week  PT DURATION: 12 weeks  PLANNED INTERVENTIONS: Therapeutic  exercises, Therapeutic activity, Neuromuscular re-education, Balance training, Gait training, Patient/Family education, Self Care, Joint mobilization, Stair training, DME instructions, Cryotherapy, Moist heat, and Manual therapy  PLAN FOR NEXT SESSION:    UE/cervical ROM as tolerated BLE and balance training.    Golden Pop, PT 12/21/2022, 1:41 PM

## 2022-12-23 ENCOUNTER — Ambulatory Visit: Payer: Commercial Managed Care - PPO | Admitting: Physical Therapy

## 2022-12-23 ENCOUNTER — Ambulatory Visit: Payer: Medicaid Other | Admitting: Nurse Practitioner

## 2022-12-23 ENCOUNTER — Ambulatory Visit: Payer: Medicaid Other | Admitting: Physical Therapy

## 2022-12-23 DIAGNOSIS — M25612 Stiffness of left shoulder, not elsewhere classified: Secondary | ICD-10-CM

## 2022-12-23 DIAGNOSIS — M6281 Muscle weakness (generalized): Secondary | ICD-10-CM

## 2022-12-23 DIAGNOSIS — M25611 Stiffness of right shoulder, not elsewhere classified: Secondary | ICD-10-CM

## 2022-12-23 DIAGNOSIS — M436 Torticollis: Secondary | ICD-10-CM

## 2022-12-23 DIAGNOSIS — R262 Difficulty in walking, not elsewhere classified: Secondary | ICD-10-CM | POA: Diagnosis not present

## 2022-12-23 DIAGNOSIS — R29898 Other symptoms and signs involving the musculoskeletal system: Secondary | ICD-10-CM

## 2022-12-23 DIAGNOSIS — M542 Cervicalgia: Secondary | ICD-10-CM

## 2022-12-23 MED FILL — Dexamethasone Sodium Phosphate Inj 100 MG/10ML: INTRAMUSCULAR | Qty: 1 | Status: AC

## 2022-12-23 MED FILL — Fosaprepitant Dimeglumine For IV Infusion 150 MG (Base Eq): INTRAVENOUS | Qty: 5 | Status: AC

## 2022-12-23 NOTE — Therapy (Signed)
OUTPATIENT PHYSICAL THERAPY NEURO EVALUATION   Patient Name: Kathryn Lucas MRN: [id number] DOB:28-Jun-1960, 62 y.o., female Today's Date: 12/23/2022   PCP: Ollen Bowl, MD  REFERRING PROVIDER: [name], MD   END OF SESSION:  PT End of Session - 12/23/22 1318     Visit Number 5    Number of Visits 24    Date for PT Re-Evaluation 03/01/23    PT Start Time 1320    PT Stop Time 1400    PT Time Calculation (min) 40 min    Equipment Utilized During Treatment Gait belt    Behavior During Therapy WFL for tasks assessed/performed             Past Medical History:  Diagnosis Date   Breast cancer (HCC)    left breast cancer   Cancer (HCC) 09/2017   left breast cancer   Family history of breast cancer    Hypothyroidism    Personal history of radiation therapy 2019   Thyroid disease    Past Surgical History:  Procedure Laterality Date   BREAST BIOPSY Left 2018   BREAST BIOPSY Left 2019   BREAST RECONSTRUCTION WITH PLACEMENT OF TISSUE EXPANDER AND ALLODERM Left 10/26/2017   Procedure: LEFT BREAST RECONSTRUCTION WITH PLACEMENT OF TISSUE EXPANDER AND ALLODERM;  Surgeon: Glenna Fellows, MD;  Location: [facility];  Service: Plastics;  Laterality: Left;   MASTECTOMY Left 2019   MASTECTOMY WITH RADIOACTIVE SEED GUIDED EXCISION AND AXILLARY SENTINEL LYMPH NODE BIOPSY Left 10/26/2017   Procedure: LEFT MASTECTOMY WITH SEED TARGETED  LEFT AXILLARY LYMPH NODE EXCISION AND LEFT SENTINEL LYMPH NODE BIOPSY;  Surgeon: Almond Lint, MD;  Location: [facility];  Service: General;  Laterality: Left;   TONSILLECTOMY     WISDOM TOOTH EXTRACTION     Patient Active Problem List   Diagnosis Date Noted   Metastatic 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) 10/14/2022   Elevated liver enzymes 10/14/2022   Unexplained weight loss 10/14/2022   Neoplasm related pain 10/14/2022   Metastatic malignant neoplasm (HCC) 12/19/2021   Family history of breast cancer    History of  therapeutic radiation 04/27/2018   Breast cancer, left breast (HCC) 10/26/2017   Malignant neoplasm of lower-outer quadrant of left breast of female, estrogen receptor positive (HCC) 07/15/2016   Plantar fasciitis 07/30/2015   Metatarsalgia 10/18/2014    ONSET DATE: September 2023   REFERRING DIAG:  Diagnosis  C79.51 (ICD-10-CM) - Secondary malignant neoplasm of bone    THERAPY DIAG:  Difficulty in walking, not elsewhere classified  Muscle weakness (generalized)  Decreased range of motion of right shoulder  Stiffness of cervical spine  Decreased range of motion of left shoulder  Painful cervical ROM  Leg weakness, bilateral  Rationale for Evaluation and Treatment: Rehabilitation  SUBJECTIVE:  SUBJECTIVE STATEMENT: Pt states that she is doing well. Reduced stiffness reported by pt in L knee and R shoulder on this day.   Pt accompanied by:  care giver   PERTINENT HISTORY:   Pt is 62 y.o. female that is seeking therapy for weakness of the R LE. Pt tripped over the threshold of her door back in September of 2023, and had a transverse fracture of the L patella when she fell on it. She was recently diagnosed with bone cancer back in October of 2023. Pt notes that she has more stiffness in the neck and pelvis region because the cancer is located in those locations.   Pt had exacerbation of s/s in June of 2024 with hospitalization and subsequent change in oncology treatment limiting ability to attend PT. Pt has responded well to treatment and now able to attend PT.   PAIN:  Are you having pain? Yes: NPRS scale: 2/10 Pain location: neck and shoulders  Pain description: stiffness  Aggravating factors: standing  Relieving factors: small pillows   PRECAUTIONS: Cervical and Other: cervical for  comfort   RED FLAGS: None   WEIGHT BEARING RESTRICTIONS: No  FALLS: Has patient fallen in last 6 months? Yes. Number of falls 1  LIVING ENVIRONMENT: Lives with: lives alone Lives in: House/apartment Stairs:  yes, but uses ramp  Has following equipment at home: Walker - 4 wheeled  PLOF: Needs assistance with ADLs and Needs assistance with homemaking  PATIENT GOALS: improve balance, strength and endurance. Stand at kitchen longer and   OBJECTIVE:   DIAGNOSTIC FINDINGS:   EXAM: BILATERAL LOWER EXTREMITY VENOUS DOPPLER ULTRASOUND IMPRESSION: Negative.  EXAM: CT HEAD WITHOUT AND WITH CONTRAST IMPRESSION: 1. Extensive osseous metastatic disease throughout the calvarium. Extraosseous soft tissue tumor in the right orbit appears improved, while the additional areas of extraosseous tumor described in detail above are otherwise overall not significantly changed. 2. Tumor is again inseparable from the anterior aspect of the superior sagittal sinus and left sigmoid sinus, unchanged. 3. Decreased but not resolved infiltrative thickening of the left inferior and medial rectus muscles. 4. Unchanged innumerable scalp nodules, nonspecific but possibly metastatic lesions.     COGNITION: Overall cognitive status: Within functional limits for tasks assessed   SENSATION: WFL  COORDINATION: Limited due to ROM deficits. WFL    MUSCLE TONE: WFL    POSTURE: rounded shoulders, forward head, and decreased lumbar lordosis  LOWER EXTREMITY MMT:   MMT  Right Eval Left Eval  Hip flexion 4 4  Hip extension    Hip abduction 4 4  Hip adduction 4+ 4+  Hip internal rotation    Hip external rotation    Knee flexion 4 4  Knee extension 4+ 4+  Ankle dorsiflexion 5 5  Ankle plantarflexion 4 4  Ankle inversion    Ankle eversion     (Blank rows = not tested)  LOWER EXTREMITY ROM:     To be tested BLE and BUE   BED MOBILITY:  Sit to supine SBA Supine to sit  SBA  TRANSFERS: Assistive device utilized: None  Sit to stand: Modified independence Stand to sit: Modified independence Chair to chair: Modified independence Floor: Modified independence   CURB:  Level of Assistance: CGA Assistive device utilized:  Youth worker Comments: step to pattern  STAIRS: Level of Assistance: CGA Stair Negotiation Technique: Step to Pattern with Bilateral Rails Number of Stairs: 4  Height of Stairs: 6  Comments: step to with BUE support   GAIT: Gait  pattern: step through pattern and circumduction- Right Distance walked: 178ft Assistive device utilized: None Level of assistance: SBA Comments: mild R LE circumduction, but greatly improved since last bout of PT treatment.   FUNCTIONAL TESTS:  5 times sit to stand: 15.82 Timed up and go (TUG): 15.72 6 minute walk test: TBD 10 meter walk test: 10.225sec (0.19m/s)  Berg Balance Scale: 4656 Functional gait assessment: TBD.   PATIENT SURVEYS:  FOTO 63 ( goal 38)  TODAY'S TREATMENT:                                                                                                                              DATE: 12/23/2022  Nustep level 1-2, BUE/BLE x 6 min; pt reports reduced tension in the S houlder and L knee allowing completion of 6 min without rest break.   Weighted gait training with 4# ankle weights x 325ft cues no foot drag and improved trunk control compared to prior sessions with this PT.   Standing on airex beam Static standing,  normal BOS/narrow BOS 30 sec each Tandem stance on airex beam 15 sec x 2 bil  Side stepping on airex beam x 3 laps.   Seated therex with 4# ankle weights :  LAQ x 10 Seated march x 12  Hip abduction x 10 bil  Standing calf raise from airex wedge.   Throughout session , pt required multiple seated rest breaks due to BLE fatigue, but recoverd quickly and able to continue without issue.    PATIENT EDUCATION: Education details: POC. Clinic orientation. Pt  educated throughout session about proper posture and technique with exercises. Improved exercise technique, movement at target joints, use of target muscles after min to mod verbal, visual, tactile cues.  Person educated: Patient and Caregiver MAry Education method: Explanation Education comprehension: verbalized understanding  HOME EXERCISE PROGRAM: Access Code: [email or web address] URL: [email or web address] Date: 12/14/2022 Prepared by: Grier Rocher  Exercises - Mini Squat with Counter Support  - 1 x daily - 7 x weekly - 3 sets - 10 reps - Standing Hip Abduction with Counter Support  - 1 x daily - 7 x weekly - 3 sets - 10 reps - Standing March with Counter Support  - 1 x daily - 7 x weekly - 3 sets - 10 reps - Seated Long Arc Quad  - 1 x daily - 7 x weekly - 3 sets - 10 reps  GOALS: Goals reviewed with patient? Yes  SHORT TERM GOALS: Target date: 01/13/2023    Patient will be independent in home exercise program to improve strength/mobility for better functional independence with ADLs. Baseline:to  be given at next session  Goal status: INITIAL   LONG TERM GOALS: Target date: 03/03/2023    Patient will increase FOTO score to equal to or greater than  68   to demonstrate statistically significant improvement in mobility and quality of life.  Baseline: 63  Goal status: INITIAL  2.  Patient (>  69 years old) will complete five times sit to stand test in < 15 seconds indicating an increased LE strength and improved balance. Baseline: 15.82 with UE support  Goal status: INITIAL  3.  Patient will increase Berg Balance score by > 6 points to demonstrate decreased fall risk during functional activities Baseline: 48 Goal status: INITIAL  4.  Patient will increase 6 min walk test by 128ft  >1.81m/s as to improve gait speed for better community ambulation and to reduce fall risk. Baseline: 950 Goal status: INITIAL  5.  Patient will reduce timed up and go to <11 seconds to  reduce fall risk and demonstrate improved transfer/gait ability. Baseline: 15.72 Goal status: INITIAL  6.  Patient will increase FGA score to >22 as to demonstrate reduced fall risk and improved dynamic gait balance for better safety with community/home ambulation.   Baseline:19 Goal status: INITIAL   ASSESSMENT:  CLINICAL IMPRESSION: Patient  seen today for physical therapy treatment for balance, strength, and endurance deficits secondary to CA treatment and prolonged immobility. PT treatment advanced with increased weight and repetitions on this day compared with prior sessions with reduced need for rest breaks. As well as improved ability to stand from standard seat with with reduced use of BUE. Pt will benefit from skilled PT to address balance, strength and ROM deficits, improve safety and QoL while continuing CA treatment.       OBJECTIVE IMPAIRMENTS: Abnormal gait, cardiopulmonary status limiting activity, decreased activity tolerance, decreased balance, decreased endurance, decreased knowledge of condition, decreased mobility, difficulty walking, decreased ROM, decreased strength, hypomobility, increased fascial restrictions, impaired perceived functional ability, impaired flexibility, impaired UE functional use, improper body mechanics, postural dysfunction, and pain.   ACTIVITY LIMITATIONS: carrying, lifting, bending, standing, squatting, sleeping, stairs, transfers, bathing, toileting, dressing, reach over head, and locomotion level  PARTICIPATION LIMITATIONS: cleaning, laundry, driving, shopping, community activity, and yard work  PERSONAL FACTORS: Age, Behavior pattern, Fitness, Past/current experiences, and 1-2 comorbidities: CA with profound metastases   are also affecting patient's functional outcome.   REHAB POTENTIAL: Good  CLINICAL DECISION MAKING: Evolving/moderate complexity  EVALUATION COMPLEXITY: Moderate  PLAN:  PT FREQUENCY: 1-2x/week  PT DURATION: 12  weeks  PLANNED INTERVENTIONS: Therapeutic exercises, Therapeutic activity, Neuromuscular re-education, Balance training, Gait training, Patient/Family education, Self Care, Joint mobilization, Stair training, DME instructions, Cryotherapy, Moist heat, and Manual therapy  PLAN FOR NEXT SESSION:   BLE and balance training.  UE/cervical ROM as tolerated  Golden Pop, PT 12/23/2022, 1:19 PM

## 2022-12-24 ENCOUNTER — Encounter: Payer: Self-pay | Admitting: Hematology and Oncology

## 2022-12-24 ENCOUNTER — Inpatient Hospital Stay: Payer: Medicaid Other | Admitting: Hematology and Oncology

## 2022-12-24 ENCOUNTER — Inpatient Hospital Stay: Payer: Medicaid Other

## 2022-12-24 ENCOUNTER — Inpatient Hospital Stay: Payer: Medicaid Other | Attending: Hematology and Oncology

## 2022-12-24 VITALS — BP 130/99 | HR 122 | Temp 97.5°F | Resp 18 | Ht 71.0 in | Wt 143.0 lb

## 2022-12-24 VITALS — BP 122/81 | HR 102 | Resp 18

## 2022-12-24 DIAGNOSIS — K5900 Constipation, unspecified: Secondary | ICD-10-CM | POA: Insufficient documentation

## 2022-12-24 DIAGNOSIS — Z9012 Acquired absence of left breast and nipple: Secondary | ICD-10-CM | POA: Diagnosis not present

## 2022-12-24 DIAGNOSIS — Z5112 Encounter for antineoplastic immunotherapy: Secondary | ICD-10-CM | POA: Diagnosis present

## 2022-12-24 DIAGNOSIS — Z923 Personal history of irradiation: Secondary | ICD-10-CM | POA: Insufficient documentation

## 2022-12-24 DIAGNOSIS — C799 Secondary malignant neoplasm of unspecified site: Secondary | ICD-10-CM

## 2022-12-24 DIAGNOSIS — C50512 Malignant neoplasm of lower-outer quadrant of left female breast: Secondary | ICD-10-CM | POA: Diagnosis present

## 2022-12-24 DIAGNOSIS — Z79899 Other long term (current) drug therapy: Secondary | ICD-10-CM | POA: Diagnosis not present

## 2022-12-24 DIAGNOSIS — Z79811 Long term (current) use of aromatase inhibitors: Secondary | ICD-10-CM | POA: Diagnosis not present

## 2022-12-24 DIAGNOSIS — C7951 Secondary malignant neoplasm of bone: Secondary | ICD-10-CM | POA: Diagnosis present

## 2022-12-24 DIAGNOSIS — Z170 Estrogen receptor positive status [ER+]: Secondary | ICD-10-CM | POA: Insufficient documentation

## 2022-12-24 DIAGNOSIS — Z9221 Personal history of antineoplastic chemotherapy: Secondary | ICD-10-CM | POA: Insufficient documentation

## 2022-12-24 LAB — CBC WITH DIFFERENTIAL (CANCER CENTER ONLY)
Abs Immature Granulocytes: 0.02 10*3/uL (ref 0.00–0.07)
Basophils Absolute: 0 10*3/uL (ref 0.0–0.1)
Basophils Relative: 1 %
Eosinophils Absolute: 0.1 10*3/uL (ref 0.0–0.5)
Eosinophils Relative: 4 %
HCT: 35.1 % — ABNORMAL LOW (ref 36.0–46.0)
Hemoglobin: 11.4 g/dL — ABNORMAL LOW (ref 12.0–15.0)
Immature Granulocytes: 1 %
Lymphocytes Relative: 15 %
Lymphs Abs: 0.5 10*3/uL — ABNORMAL LOW (ref 0.7–4.0)
MCH: 36.1 pg — ABNORMAL HIGH (ref 26.0–34.0)
MCHC: 32.5 g/dL (ref 30.0–36.0)
MCV: 111.1 fL — ABNORMAL HIGH (ref 80.0–100.0)
Monocytes Absolute: 0.4 10*3/uL (ref 0.1–1.0)
Monocytes Relative: 12 %
Neutro Abs: 2.3 10*3/uL (ref 1.7–7.7)
Neutrophils Relative %: 67 %
Platelet Count: 316 10*3/uL (ref 150–400)
RBC: 3.16 MIL/uL — ABNORMAL LOW (ref 3.87–5.11)
RDW: 17.3 % — ABNORMAL HIGH (ref 11.5–15.5)
WBC Count: 3.4 10*3/uL — ABNORMAL LOW (ref 4.0–10.5)
nRBC: 0 % (ref 0.0–0.2)

## 2022-12-24 LAB — CMP (CANCER CENTER ONLY)
ALT: 10 U/L (ref 0–44)
AST: 22 U/L (ref 15–41)
Albumin: 3.5 g/dL (ref 3.5–5.0)
Alkaline Phosphatase: 133 U/L — ABNORMAL HIGH (ref 38–126)
Anion gap: 6 (ref 5–15)
BUN: 5 mg/dL — ABNORMAL LOW (ref 8–23)
CO2: 26 mmol/L (ref 22–32)
Calcium: 9.3 mg/dL (ref 8.9–10.3)
Chloride: 105 mmol/L (ref 98–111)
Creatinine: 0.55 mg/dL (ref 0.44–1.00)
GFR, Estimated: >60 mL/min (ref >60)
Glucose, Bld: 85 mg/dL (ref 70–99)
Potassium: 3.7 mmol/L (ref 3.5–5.1)
Sodium: 137 mmol/L (ref 135–145)
Total Bilirubin: 0.3 mg/dL (ref 0.3–1.2)
Total Protein: 6.4 g/dL — ABNORMAL LOW (ref 6.5–8.1)

## 2022-12-24 MED ORDER — ACETAMINOPHEN 325 MG PO TABS
650.0000 mg | ORAL_TABLET | Freq: Once | ORAL | Status: AC
  Administered 2022-12-24: 650 mg via ORAL
  Filled 2022-12-24: qty 2

## 2022-12-24 MED ORDER — FAM-TRASTUZUMAB DERUXTECAN-NXKI CHEMO 100 MG IV SOLR
3.2000 mg/kg | Freq: Once | INTRAVENOUS | Status: AC
  Administered 2022-12-24: 200 mg via INTRAVENOUS
  Filled 2022-12-24: qty 10

## 2022-12-24 MED ORDER — SODIUM CHLORIDE 0.9 % IV SOLN
10.0000 mg | Freq: Once | INTRAVENOUS | Status: AC
  Administered 2022-12-24: 10 mg via INTRAVENOUS
  Filled 2022-12-24: qty 10

## 2022-12-24 MED ORDER — DIPHENHYDRAMINE HCL 25 MG PO CAPS
50.0000 mg | ORAL_CAPSULE | Freq: Once | ORAL | Status: AC
  Administered 2022-12-24: 50 mg via ORAL
  Filled 2022-12-24: qty 2

## 2022-12-24 MED ORDER — DENOSUMAB 120 MG/1.7ML ~~LOC~~ SOLN
120.0000 mg | Freq: Once | SUBCUTANEOUS | Status: AC
  Administered 2022-12-24: 120 mg via SUBCUTANEOUS
  Filled 2022-12-24: qty 1.7

## 2022-12-24 MED ORDER — PALONOSETRON HCL INJECTION 0.25 MG/5ML
0.2500 mg | Freq: Once | INTRAVENOUS | Status: AC
  Administered 2022-12-24: 0.25 mg via INTRAVENOUS
  Filled 2022-12-24: qty 5

## 2022-12-24 MED ORDER — SODIUM CHLORIDE 0.9 % IV SOLN
150.0000 mg | Freq: Once | INTRAVENOUS | Status: AC
  Administered 2022-12-24: 150 mg via INTRAVENOUS
  Filled 2022-12-24: qty 150

## 2022-12-24 MED ORDER — DEXTROSE 5 % IV SOLN: Freq: Once | INTRAVENOUS | Status: AC

## 2022-12-24 NOTE — Patient Instructions (Signed)
[facility]   Discharge Instructions: Thank you for choosing [facility] to provide your oncology and hematology care.   If you have a lab appointment with the Cancer Center, please go directly to the Cancer Center and check in at the registration area.   Wear comfortable clothing and clothing appropriate for easy access to any Portacath or PICC line.   We strive to give you quality time with your provider. You may need to reschedule your appointment if you arrive late (15 or more minutes).  Arriving late affects you and other patients whose appointments are after yours.  Also, if you miss three or more appointments without notifying the office, you may be dismissed from the clinic at the provider's discretion.      For prescription refill requests, have your pharmacy contact our office and allow 72 hours for refills to be completed.    Today you received the following chemotherapy and/or immunotherapy agents: Fam-trastuzumab deruxtecan (Enhertu)       To help prevent nausea and vomiting after your treatment, we encourage you to take your nausea medication as directed.  BELOW ARE SYMPTOMS THAT SHOULD BE REPORTED IMMEDIATELY: *FEVER GREATER THAN 100.4 F (38 C) OR HIGHER *CHILLS OR SWEATING *NAUSEA AND VOMITING THAT IS NOT CONTROLLED WITH YOUR NAUSEA MEDICATION *UNUSUAL SHORTNESS OF BREATH *UNUSUAL BRUISING OR BLEEDING *URINARY PROBLEMS (pain or burning when urinating, or frequent urination) *BOWEL PROBLEMS (unusual diarrhea, constipation, pain near the anus) TENDERNESS IN MOUTH AND THROAT WITH OR WITHOUT PRESENCE OF ULCERS (sore throat, sores in mouth, or a toothache) UNUSUAL RASH, SWELLING OR PAIN  UNUSUAL VAGINAL DISCHARGE OR ITCHING   Items with * indicate a potential emergency and should be followed up as soon as possible or go to the Emergency Department if any problems should occur.  Please show the CHEMOTHERAPY ALERT CARD or  IMMUNOTHERAPY ALERT CARD at check-in to the Emergency Department and triage nurse.  Should you have questions after your visit or need to cancel or reschedule your appointment, please contact [facility]  Dept: [phone]  and follow the prompts.  Office hours are 8:00 a.m. to 4:30 p.m. Monday - Friday. Please note that voicemails left after 4:00 p.m. may not be returned until the following business day.  We are closed weekends and major holidays. You have access to a nurse at all times for urgent questions. Please call the main number to the clinic Dept: [phone] and follow the prompts.   For any non-urgent questions, you may also contact your provider using MyChart. We now offer e-Visits for anyone 75 and older to request care online for non-urgent symptoms. For details visit [email or web address].   Also download the MyChart app! Go to the app store, search "MyChart", open the app, select [facility], and log in with your MyChart username and password.

## 2022-12-24 NOTE — Assessment & Plan Note (Signed)
10/26/17: Left mastectomy: IDC grade 1, 2 foci largest spans 8.5 cm, intermediate grade DCIS, lymphovascular invasion identified, perineural invasion identified, 1/2 lymph nodes positive with extracapsular extension, ER 9200%, PR 5 to 50%, HER-2 negative, Ki-67 10 to 15%, T3N1A   Oncotype DX score 22, intermediate risk, chemotherapy not felt to have significant benefit.   Treatment Summary: 1. Antiestrogen therapy with anastrozole 1 mg daily started 07/15/2016 2. Mastectomy 10/26/2017, Mammaprint low risk luminal type A 3. Followed by adjuvant radiation 12/08/17- 01/26/18  4. Followed by adjuvant antiestrogen therapy anastrozole started 01/17/2018 (originally started 07/15/2016) -------------------------------------------------------------------- Low back pain August 2023: Underwent CT myelogram: Large expansile lesion in the sacrum with extraosseous extension of the tumor, diffuse lytic lesions throughout the visualized spine with metastatic disease myeloma is considered less likely.  (This was ordered by Dr. Marcene Corning)   Treatment plan: 1.  PET CT scan 12/27/2021: Widespread bone metastatic disease largest lesion involving the sacrum with pathological fractures of T6 and T10 retroperitoneal and pelvic lymph node metastasis, right axillary lymph node, activity in the pancreatic head, hypermetabolic activity in the adrenal glands, right thyroid nodule. 2. biopsy of sacrum: 12/26/2021: Metastatic breast cancer, ER 90%, PR 10%, HER2 negative (0) 3.  Ibrance along with Faslodex started 12/25/2021-10/14/2022 4.  Xgeva for bone metastases.  Every 3 months 5.  Palliative radiation to the sacrum completed 01/19/2022 ---------------------------------------------------------------------------------------------------------------------- Current treatment: Enhertu started 10/21/2022, today cycle 4 We plan to continue this treatment irrespective of her blood counts.   Enhertu toxicities: Tolerated extremely  well without any problems. Completed radiation   Liver function tests have started to improve.  PET/CT 10/29/2022: Overall improved appearance of bone lesions.  Stable right extra lymph node, retroperitoneal lymph nodes decreased activity CT head ordered by Dr. 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  Unchanged innumerable scalp nodules  Return to clinic every 3 weeks for Enhertu treatment.

## 2022-12-24 NOTE — Progress Notes (Signed)
Per Dr. Pamelia Hoit - okay to proceed with treatment with elevated HR of 122 and BP of 130/99.

## 2022-12-25 ENCOUNTER — Encounter: Payer: Self-pay | Admitting: Hematology and Oncology

## 2022-12-25 NOTE — Progress Notes (Signed)
Patient Care Team: Ollen Bowl, MD as PCP - General (Internal Medicine) [name], MD as Consulting Physician (Hematology and Oncology) Almond Lint, MD as Consulting Physician (General Surgery) Dorothy Puffer, MD as Consulting Physician (Radiation Oncology) Carmina Miller, MD as Consulting Physician (Radiation Oncology)  DIAGNOSIS:  Encounter Diagnosis  Name Primary?   Malignant neoplasm of lower-outer quadrant of left breast of female, estrogen receptor positive (HCC) Yes    SUMMARY OF ONCOLOGIC HISTORY: Oncology History  Malignant neoplasm of lower-outer quadrant of left breast of female, estrogen receptor positive (HCC)  06/11/2016 Mammogram   Palpable left breast masses 3:00 position: 2.2 cm; 5:30 position: 2.5 cm; 6:30 position: 0.7 cm   06/19/2016 Initial Diagnosis   Left breast biopsy 3:30: IDC with DCIS grade 1, ER 90%, PR 50%, Ki-67 15%, HER-2 negative ratio 1.13; biopsy 5:30 position: IDC grade 1   07/13/2016 Breast MRI   Large area of abnormal enhancement lower inner and lower outer quadrants left breast spanning 9 cm x 6.4 cm x 5.3 cm, no abnormal enlarged lymph nodes; T3 N0 stage II a (New AJCC staging)    07/15/2016 - 12/08/2017 Anti-estrogen oral therapy   Neoadjuvant anastrozole 1 mg daily   07/17/2016 Oncotype testing   Testing done on the biopsy: Oncotype DX score 22, intermediate risk   02/02/2017 Breast MRI   Left breast multicentric disease unchanged measuring 2.7 x 1.6 cm.  Mass in the non-mass enhancement are also not significantly changed measuring 6.2 x 2.4 cm. new enhancing mass within the outer right breast 7 mm which could be fat necrosis or inclusion cyst    02/09/2017 Imaging   Ultrasound of the right breast lesion noted on MRI: No sonographic finding corresponds to the abnormality noted on MRI   07/13/2017 Cancer Staging   Staging form: Breast, AJCC 8th Edition - Clinical stage from 07/13/2017: Stage IIA (cT3, cN0, cM0, G1, ER+, PR+, HER2-) -  Signed by Loa Socks, NP on 05/18/2018   10/26/2017 Surgery   Left mastectomy: IDC grade 1, 2 foci largest spans 8.5 cm, intermediate grade DCIS, lymphovascular invasion identified, perineural invasion identified, 1/2 lymph nodes positive with extracapsular extension, ER 9200%, PR 5 to 50%, HER-2 negative, Ki-67 10 to 15%, T3N1A Mammaprint: low risk   11/02/2017 Cancer Staging   Staging form: Breast, AJCC 8th Edition - Pathologic: No Stage Recommended (ypT3, pN1a, cM0, G1, ER+, PR+, HER2-) - Signed by [name], MD on 11/02/2017   12/08/2017 - 01/26/2018 Radiation Therapy   Adjuvant radiation therapy    02/2018 -  Anti-estrogen oral therapy   Anastrozole 1 mg daily adjuvant therapy   10/21/2022 -  Chemotherapy   Patient is on Treatment Plan : BREAST METASTATIC Fam-Trastuzumab Deruxtecan-nxki (Enhertu) (5.4) q21d       CHIEF COMPLIANT: Follow-up of metastatic breast cancer on Enhertu  History of Present Illness   The patient, with a history of metastatic breast cancer, presents for a follow-up visit. She reports significant improvement in strength and mobility, now able to drive and play with her dog. She attributes this progress to her ongoing treatment and physical therapy, which she describes as "fabulous." However, she notes that the therapy can be tiring, and she believes her therapist is working her harder as her sessions are nearing an end.  The patient also mentions experiencing constipation, which she believes is a side effect of her medication. Despite this, she is generally pleased with her treatment and has noticed improvements in her overall health. She  has been diligent about her nutrition, focusing on protein and calcium intake, and has noticed improvements in her lab results.  The patient's sister, who accompanies her to the visit, confirms the patient's progress and notes that the patient is more relaxed than before. She visits the patient every weekend and takes  time off work to accompany her to their appointments.       ALLERGIES:  is allergic to percocet [oxycodone-acetaminophen].  MEDICATIONS:  Current Outpatient Medications  Medication Sig Dispense Refill   azelastine (ASTELIN) 0.1 % nasal spray Place into both nostrils 2 (two) times daily. (Patient not taking: Reported on 11/03/2022)     Calcium 500-100 MG-UNIT CHEW Chew 1 tablet by mouth daily. (Patient not taking: Reported on 11/03/2022) 60 tablet    cetirizine (ZYRTEC) 10 MG tablet Take by mouth. (Patient not taking: Reported on 11/03/2022)     cholecalciferol (VITAMIN D3) 25 MCG (1000 UNIT) tablet Take 1 tablet (1,000 Units total) by mouth daily. (Patient not taking: Reported on 11/03/2022)     dexamethasone (DECADRON) 4 MG tablet Take 1 tab for 2 days after chemo 30 tablet 1   furosemide (LASIX) 20 MG tablet Take 1 tablet (20 mg total) by mouth daily. If needed may take 1/2 tab q am 30 tablet 0   levothyroxine (SYNTHROID) 112 MCG tablet Take 1 tablet by mouth daily.     LORazepam (ATIVAN) 0.5 MG tablet Take 1 tablet (0.5 mg total) by mouth at bedtime. (Patient not taking: Reported on 11/03/2022) 30 tablet 0   omeprazole (PRILOSEC) 40 MG capsule 1 capsule twice a day for 2 weeks and then once daily Orally twice a day for 30 days     ondansetron (ZOFRAN) 8 MG tablet Take 1 tablet (8 mg total) by mouth every 8 (eight) hours as needed for nausea or vomiting. Start on the third day after chemotherapy. (Patient not taking: Reported on 11/03/2022) 30 tablet 1   pantoprazole (PROTONIX) 40 MG tablet 1 tablet Orally Once a day for 30 days Take 1 tablet twice daily for 2 weeks then take 1 tablet daily.     prochlorperazine (COMPAZINE) 10 MG tablet Take 1 tablet (10 mg total) by mouth every 6 (six) hours as needed for nausea or vomiting. (Patient not taking: Reported on 11/03/2022) 30 tablet 1   sucralfate (CARAFATE) 1 g tablet Take 1 tablet (1 g total) by mouth 4 (four) times daily -  with meals and at bedtime.  Dissolve in warm water, swish and swallow 120 tablet 1   vitamin C (ASCORBIC ACID) 250 MG tablet Take 1 tablet (250 mg total) by mouth daily. (Patient not taking: Reported on 11/03/2022)     No current facility-administered medications for this visit.    PHYSICAL EXAMINATION: ECOG PERFORMANCE STATUS: 1 - Symptomatic but completely ambulatory  Vitals:   12/24/22 1510  BP: (!) 130/99  Pulse: (!) 122  Resp: 18  Temp: (!) 97.5 F (36.4 C)  SpO2: 98%   Filed Weights   12/24/22 1510  Weight: 143 lb (64.9 kg)    Physical Exam   MEASUREMENTS: WT- 161 pounds         LABORATORY DATA:  I have reviewed the data as listed    Latest Ref Rng & Units 12/24/2022    2:46 PM 12/01/2022    2:32 PM 11/10/2022    2:50 PM  CMP  Glucose 70 - 99 mg/dL 85  80  96   BUN 8 - 23 mg/dL 5  10  5   Creatinine 0.44 - 1.00 mg/dL 7.82  9.56  2.13   Sodium 135 - 145 mmol/L 137  137  134   Potassium 3.5 - 5.1 mmol/L 3.7  3.9  3.8   Chloride 98 - 111 mmol/L 105  106  103   CO2 22 - 32 mmol/L 26  27  25    Calcium 8.9 - 10.3 mg/dL 9.3  8.7  8.2   Total Protein 6.5 - 8.1 g/dL 6.4  6.1  5.9   Total Bilirubin 0.3 - 1.2 mg/dL 0.3  0.3  0.4   Alkaline Phos 38 - 126 U/L 133  86  73   AST 15 - 41 U/L 22  26  63   ALT 0 - 44 U/L 10  13  16      Lab Results  Component Value Date   WBC 3.4 (L) 12/24/2022   HGB 11.4 (L) 12/24/2022   HCT 35.1 (L) 12/24/2022   MCV 111.1 (H) 12/24/2022   PLT 316 12/24/2022   NEUTROABS 2.3 12/24/2022    ASSESSMENT & PLAN:  Malignant neoplasm of lower-outer quadrant of left breast of female, estrogen receptor positive (HCC) 10/26/17: Left mastectomy: IDC grade 1, 2 foci largest spans 8.5 cm, intermediate grade DCIS, lymphovascular invasion identified, perineural invasion identified, 1/2 lymph nodes positive with extracapsular extension, ER 9200%, PR 5 to 50%, HER-2 negative, Ki-67 10 to 15%, T3N1A   Oncotype DX score 22, intermediate risk, chemotherapy not felt to have  significant benefit.   Treatment Summary: 1. Antiestrogen therapy with anastrozole 1 mg daily started 07/15/2016 2. Mastectomy 10/26/2017, Mammaprint low risk luminal type A 3. Followed by adjuvant radiation 12/08/17- 01/26/18  4. Followed by adjuvant antiestrogen therapy anastrozole started 01/17/2018 (originally started 07/15/2016) -------------------------------------------------------------------- Low back pain August 2023: Underwent CT myelogram: Large expansile lesion in the sacrum with extraosseous extension of the tumor, diffuse lytic lesions throughout the visualized spine with metastatic disease myeloma is considered less likely.  (This was ordered by Dr. Marcene Corning)   Treatment plan: 1.  PET CT scan 12/27/2021: Widespread bone metastatic disease largest lesion involving the sacrum with pathological fractures of T6 and T10 retroperitoneal and pelvic lymph node metastasis, right axillary lymph node, activity in the pancreatic head, hypermetabolic activity in the adrenal glands, right thyroid nodule. 2. biopsy of sacrum: 12/26/2021: Metastatic breast cancer, ER 90%, PR 10%, HER2 negative (0) 3.  Ibrance along with Faslodex started 12/25/2021-10/14/2022 4.  Xgeva for bone metastases.  Every 3 months 5.  Palliative radiation to the sacrum completed 01/19/2022 ---------------------------------------------------------------------------------------------------------------------- Current treatment: Enhertu started 10/21/2022, today cycle 4 We plan to continue this treatment irrespective of her blood counts.   Enhertu toxicities: Tolerated extremely well without any problems. Completed radiation   Liver function tests have started to improve.  PET/CT 10/29/2022: Overall improved appearance of bone lesions.  Stable right extra lymph node, retroperitoneal lymph nodes decreased activity CT head ordered by Dr. Barbaraann Cao 11/26/2022: Extensive osseous mets on the skull, extraosseous soft tissue tumor in  the right orbit appears improved, inseparable from anterior aspect of superior sagittal sinus and left sigmoid sinus.  Unchanged innumerable scalp nodules  Return to clinic every 3 weeks for Enhertu treatment. ------------------------------------- Assessment and Plan    Cancer (unspecified type) Patient is currently on Enhertu and reports feeling stronger with improved mobility. No pain reported. Noted 25% hair thinning. Constipation reported as a side effect of the medication. -Continue Enhertu as prescribed. -Encourage continued physical activity and balanced diet.  Liver function Patient inquired about liver function. Recent labs show liver function within normal limits. -Monitor liver function with regular lab work.  General Health Maintenance Patient has gained weight and is aiming to gain more. Reports being picky with food. Improved nutrition noted with increased protein and albumin levels. -Encourage continued balanced diet with focus on protein intake. -Plan for CT scan in November to assess cancer progression. -Follow-up in three weeks.          No orders of the defined types were placed in this encounter.  The patient has a good understanding of the overall plan. she agrees with it. she will call with any problems that may develop before the next visit here. Total time spent: 30 mins including face to face time and time spent for planning, charting and co-ordination of care   Tamsen Meek, MD 12/25/22

## 2022-12-28 ENCOUNTER — Encounter: Payer: Self-pay | Admitting: Cardiology

## 2022-12-28 ENCOUNTER — Ambulatory Visit: Payer: Commercial Managed Care - PPO | Admitting: Physical Therapy

## 2022-12-28 ENCOUNTER — Ambulatory Visit: Payer: Medicaid Other | Admitting: Physical Therapy

## 2022-12-28 ENCOUNTER — Ambulatory Visit: Payer: Medicaid Other | Attending: Cardiology | Admitting: Cardiology

## 2022-12-28 VITALS — BP 108/84 | HR 90 | Ht 70.0 in | Wt 141.6 lb

## 2022-12-28 DIAGNOSIS — R000 Tachycardia, unspecified: Secondary | ICD-10-CM

## 2022-12-28 DIAGNOSIS — M25562 Pain in left knee: Secondary | ICD-10-CM

## 2022-12-28 DIAGNOSIS — M542 Cervicalgia: Secondary | ICD-10-CM

## 2022-12-28 DIAGNOSIS — K209 Esophagitis, unspecified without bleeding: Secondary | ICD-10-CM | POA: Diagnosis not present

## 2022-12-28 DIAGNOSIS — R262 Difficulty in walking, not elsewhere classified: Secondary | ICD-10-CM

## 2022-12-28 DIAGNOSIS — M436 Torticollis: Secondary | ICD-10-CM

## 2022-12-28 DIAGNOSIS — M25612 Stiffness of left shoulder, not elsewhere classified: Secondary | ICD-10-CM

## 2022-12-28 DIAGNOSIS — M25611 Stiffness of right shoulder, not elsewhere classified: Secondary | ICD-10-CM

## 2022-12-28 DIAGNOSIS — R29898 Other symptoms and signs involving the musculoskeletal system: Secondary | ICD-10-CM

## 2022-12-28 DIAGNOSIS — M6281 Muscle weakness (generalized): Secondary | ICD-10-CM

## 2022-12-28 NOTE — Therapy (Unsigned)
OUTPATIENT PHYSICAL THERAPY NEURO EVALUATION   Patient Name: Kathryn Lucas MRN: [id number] DOB:January 27, 1961, 62 y.o., female Today's Date: 12/28/2022   PCP: Ollen Bowl, MD  REFERRING PROVIDER: [name], MD   END OF SESSION:  PT End of Session - 12/28/22 1625     Visit Number 6    Number of Visits 24    Date for PT Re-Evaluation 03/01/23    PT Start Time 1621    PT Stop Time 1701    PT Time Calculation (min) 40 min    Equipment Utilized During Treatment Gait belt    Behavior During Therapy WFL for tasks assessed/performed             Past Medical History:  Diagnosis Date   Breast cancer (HCC)    left breast cancer   Cancer (HCC) 09/2017   left breast cancer   Family history of breast cancer    Hypothyroidism    Personal history of radiation therapy 2019   Thyroid disease    Past Surgical History:  Procedure Laterality Date   BREAST BIOPSY Left 2018   BREAST BIOPSY Left 2019   BREAST RECONSTRUCTION WITH PLACEMENT OF TISSUE EXPANDER AND ALLODERM Left 10/26/2017   Procedure: LEFT BREAST RECONSTRUCTION WITH PLACEMENT OF TISSUE EXPANDER AND ALLODERM;  Surgeon: Glenna Fellows, MD;  Location: [facility];  Service: Plastics;  Laterality: Left;   MASTECTOMY Left 2019   MASTECTOMY WITH RADIOACTIVE SEED GUIDED EXCISION AND AXILLARY SENTINEL LYMPH NODE BIOPSY Left 10/26/2017   Procedure: LEFT MASTECTOMY WITH SEED TARGETED  LEFT AXILLARY LYMPH NODE EXCISION AND LEFT SENTINEL LYMPH NODE BIOPSY;  Surgeon: Almond Lint, MD;  Location: [facility];  Service: General;  Laterality: Left;   TONSILLECTOMY     WISDOM TOOTH EXTRACTION     Patient Active Problem List   Diagnosis Date Noted   Metastatic 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) 10/14/2022   Elevated liver enzymes 10/14/2022   Unexplained weight loss 10/14/2022   Neoplasm related pain 10/14/2022   Metastatic malignant neoplasm (HCC) 12/19/2021   Family history of breast cancer    History of  therapeutic radiation 04/27/2018   Breast cancer, left breast (HCC) 10/26/2017   Malignant neoplasm of lower-outer quadrant of left breast of female, estrogen receptor positive (HCC) 07/15/2016   Plantar fasciitis 07/30/2015   Metatarsalgia 10/18/2014    ONSET DATE: September 2023   REFERRING DIAG:  Diagnosis  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 Reports that she was seen by cardiology this morning for tachycardia. MD appointment this morning and infustion end of last week is making her feel weak and tired.   Pt accompanied by:  care giver   PERTINENT HISTORY:   Pt is 62 y.o. female that is seeking therapy for weakness of the R LE. Pt tripped over the threshold of her door back in September of 2023, and had a transverse fracture of the L patella when she fell on it. She was recently diagnosed with bone cancer back in October of 2023. Pt notes that she has more stiffness in the neck and pelvis region because the cancer is located in those locations.   Pt had exacerbation of s/s in June of 2024 with hospitalization and subsequent change in oncology treatment limiting ability to attend PT. Pt has responded well to treatment and now able to attend PT.   PAIN:  Are you having pain? Yes: NPRS scale: 2/10 Pain location: neck and shoulders  Pain description:  stiffness  Aggravating factors: standing  Relieving factors: small pillows   PRECAUTIONS: Cervical and Other: cervical for comfort   RED FLAGS: None   WEIGHT BEARING RESTRICTIONS: No  FALLS: Has patient fallen in last 6 months? Yes. Number of falls 1  LIVING ENVIRONMENT: Lives with: lives alone Lives in: House/apartment Stairs:  yes, but uses ramp  Has following equipment at home: Walker - 4 wheeled  PLOF: Needs assistance with ADLs and Needs assistance with homemaking  PATIENT GOALS: improve balance, strength and endurance. Stand at kitchen longer and   OBJECTIVE:   DIAGNOSTIC FINDINGS:   EXAM: BILATERAL LOWER EXTREMITY VENOUS DOPPLER ULTRASOUND IMPRESSION: Negative.  EXAM: CT HEAD WITHOUT AND WITH CONTRAST IMPRESSION: 1. Extensive osseous metastatic disease throughout the calvarium. Extraosseous soft tissue tumor in the right orbit appears improved, while the additional areas of extraosseous tumor described in detail above are otherwise overall not significantly changed. 2. Tumor is again inseparable from the anterior aspect of the superior sagittal sinus and left sigmoid sinus, unchanged. 3. Decreased but not resolved infiltrative thickening of the left inferior and medial rectus muscles. 4. Unchanged innumerable scalp nodules, nonspecific but possibly metastatic lesions.     COGNITION: Overall cognitive status: Within functional limits for tasks assessed   SENSATION: WFL  COORDINATION: Limited due to ROM deficits. WFL    MUSCLE TONE: WFL    POSTURE: rounded shoulders, forward head, and decreased lumbar lordosis  LOWER EXTREMITY MMT:   MMT  Right Eval Left Eval  Hip flexion 4 4  Hip extension    Hip abduction 4 4  Hip adduction 4+ 4+  Hip internal rotation    Hip external rotation    Knee flexion 4 4  Knee extension 4+ 4+  Ankle dorsiflexion 5 5  Ankle plantarflexion 4 4  Ankle inversion    Ankle eversion     (Blank rows = not  tested)  LOWER EXTREMITY ROM:     To be tested BLE and BUE   BED MOBILITY:  Sit to supine SBA Supine to sit SBA  TRANSFERS: Assistive device utilized: None  Sit to stand: Modified independence Stand to sit: Modified independence Chair to chair: Modified independence Floor: Modified independence   CURB:  Level of Assistance: CGA Assistive device utilized:  Youth worker Comments: step to pattern  STAIRS: Level of Assistance: CGA Stair Negotiation Technique: Step to Pattern with Bilateral Rails Number of Stairs: 4  Height  of Stairs: 6  Comments: step to with BUE support   GAIT: Gait pattern: step through pattern and circumduction- Right Distance walked: 131ft Assistive device utilized: None Level of assistance: SBA Comments: mild R LE circumduction, but greatly improved since last bout of PT treatment.   FUNCTIONAL TESTS:  5 times sit to stand: 15.82 Timed up and go (TUG): 15.72 6 minute walk test: TBD 10 meter walk test: 10.225sec (0.31m/s)  Berg Balance Scale: 4656 Functional gait assessment: TBD.   PATIENT SURVEYS:  FOTO 63 ( goal 64)  TODAY'S TREATMENT:                                                                                                                              DATE: 12/28/2022  Nustep level 1-2, BUE/BLE x 6 min; pt reports reduced tension in the S houlder and L knee allowing completion of 6 min without rest break.   Seated therex  HS curl x 12 RTB  LAQ x 12 RTB Hip abduction x 12 RTB  Hip flexion x 12 RTB Cervical rotation R and L AROM x 10  Cervical flexion/extension AROM x 10  Seated Row YTB .  Ankle PF RTB x 15    Weighted gait training with 4# ankle weights x 346ft cues no foot drag and improved trunk control compared to prior sessions with this PT.   Standing on airex beam Static standing,  normal BOS/narrow BOS 30 sec each Tandem stance on airex beam 15 sec x 2 bil  Side stepping on airex beam x 3 laps.   Seated therex with 4#  ankle weights :  LAQ x 10 Seated march x 12  Hip abduction x 10 bil  Standing calf raise from airex wedge.   Throughout session , pt required multiple seated rest breaks due to BLE fatigue, but recoverd quickly and able to continue without issue.    PATIENT EDUCATION: Education details: POC. Clinic orientation. Pt educated throughout session about proper posture and technique with exercises. Improved exercise technique, movement at target joints, use of target muscles after min to mod verbal, visual, tactile cues.  Person educated: Patient and Caregiver MAry Education method: Explanation Education comprehension: verbalized understanding  HOME EXERCISE PROGRAM: Access Code: [email or web address] URL: [email or web address] Date: 12/14/2022 Prepared by: Grier Rocher  Exercises - Mini Squat with Counter Support  - 1 x daily - 7 x weekly - 3 sets - 10 reps - Standing Hip Abduction with Counter Support  - 1 x daily - 7 x weekly - 3 sets - 10 reps - Standing March with Counter Support  - 1 x daily - 7 x weekly - 3 sets - 10 reps - Seated Long Arc Quad  - 1 x daily - 7 x weekly - 3 sets - 10 reps  GOALS: Goals reviewed with patient? Yes  SHORT TERM GOALS: Target date: 01/13/2023    Patient will be independent in home exercise program to improve strength/mobility  for better functional independence with ADLs. Baseline:to  be given at next session  Goal status: INITIAL   LONG TERM GOALS: Target date: 03/03/2023    Patient will increase FOTO score to equal to or greater than  68   to demonstrate statistically significant improvement in mobility and quality of life.  Baseline: 63  Goal status: INITIAL  2.  Patient (> 42 years old) will complete five times sit to stand test in < 15 seconds indicating an increased LE strength and improved balance. Baseline: 15.82 with UE support  Goal status: INITIAL  3.  Patient will increase Berg Balance score by > 6 points to demonstrate  decreased fall risk during functional activities Baseline: 48 Goal status: INITIAL  4.  Patient will increase 6 min walk test by 128ft  >1.3m/s as to improve gait speed for better community ambulation and to reduce fall risk. Baseline: 950 Goal status: INITIAL  5.  Patient will reduce timed up and go to <11 seconds to reduce fall risk and demonstrate improved transfer/gait ability. Baseline: 15.72 Goal status: INITIAL  6.  Patient will increase FGA score to >22 as to demonstrate reduced fall risk and improved dynamic gait balance for better safety with community/home ambulation.   Baseline:19 Goal status: INITIAL   ASSESSMENT:  CLINICAL IMPRESSION: Patient  seen today for physical therapy treatment for balance, strength, and endurance deficits secondary to CA treatment and prolonged immobility. PT treatment advanced with increased weight and repetitions on this day compared with prior sessions with reduced need for rest breaks. As well as improved ability to stand from standard seat with with reduced use of BUE. Pt will benefit from skilled PT to address balance, strength and ROM deficits, improve safety and QoL while continuing CA treatment.       OBJECTIVE IMPAIRMENTS: Abnormal gait, cardiopulmonary status limiting activity, decreased activity tolerance, decreased balance, decreased endurance, decreased knowledge of condition, decreased mobility, difficulty walking, decreased ROM, decreased strength, hypomobility, increased fascial restrictions, impaired perceived functional ability, impaired flexibility, impaired UE functional use, improper body mechanics, postural dysfunction, and pain.   ACTIVITY LIMITATIONS: carrying, lifting, bending, standing, squatting, sleeping, stairs, transfers, bathing, toileting, dressing, reach over head, and locomotion level  PARTICIPATION LIMITATIONS: cleaning, laundry, driving, shopping, community activity, and yard work  PERSONAL FACTORS: Age,  Behavior pattern, Fitness, Past/current experiences, and 1-2 comorbidities: CA with profound metastases   are also affecting patient's functional outcome.   REHAB POTENTIAL: Good  CLINICAL DECISION MAKING: Evolving/moderate complexity  EVALUATION COMPLEXITY: Moderate  PLAN:  PT FREQUENCY: 1-2x/week  PT DURATION: 12 weeks  PLANNED INTERVENTIONS: Therapeutic exercises, Therapeutic activity, Neuromuscular re-education, Balance training, Gait training, Patient/Family education, Self Care, Joint mobilization, Stair training, DME instructions, Cryotherapy, Moist heat, and Manual therapy  PLAN FOR NEXT SESSION:   BLE and balance training.  UE/cervical ROM as tolerated  Golden Pop, PT 12/28/2022, 4:26 PM

## 2022-12-28 NOTE — Progress Notes (Signed)
Cardiology Office Note:    Date:  12/28/2022   ID:  Kathryn Lucas, DOB Aug 03, 1960, MRN [id number]  PCP:  Ollen Bowl, MD   [facility] Providers Cardiologist:  None     Referring MD: Ollen Bowl, MD   Chief Complaint  Patient presents with   New Patient (Initial Visit)    Referred for cardiac evaluation of Tachycardia with no cardiac history.      History of Present Illness:    Kathryn Lucas is a 62 y.o. female with a hx of hypothyroidism, GERD, left breast cancer s/p mastectomy 2019 +RT, +antiestrogen Rx who presents with tachycardia.  Patient states noticing elevated heart rates while walking with physical therapy about 7 months ago.  Heart rates become elevated with exertion up to 130 bpm. heart rates are usually normal at rest.  Followed up with PCP, EKG obtained in the office showed normal heart rate with no arrhythmia.  Denies any history of heart disease, denies dizziness.  Loves working with physical therapy.  Takes Carafate and Protonix due to esophagitis/inflammation from radiation therapy for breast cancer treatment.  Echo 10/2022 EF 65 to 70%, impaired relaxation  Past Medical History:  Diagnosis Date   Breast cancer (HCC)    left breast cancer   Cancer (HCC) 09/2017   left breast cancer   Family history of breast cancer    Hypothyroidism    Personal history of radiation therapy 2019   Thyroid disease     Past Surgical History:  Procedure Laterality Date   BREAST BIOPSY Left 2018   BREAST BIOPSY Left 2019   BREAST RECONSTRUCTION WITH PLACEMENT OF TISSUE EXPANDER AND ALLODERM Left 10/26/2017   Procedure: LEFT BREAST RECONSTRUCTION WITH PLACEMENT OF TISSUE EXPANDER AND ALLODERM;  Surgeon: Glenna Fellows, MD;  Location: [facility];  Service: Plastics;  Laterality: Left;   MASTECTOMY Left 2019   MASTECTOMY WITH RADIOACTIVE SEED GUIDED EXCISION AND AXILLARY SENTINEL LYMPH NODE BIOPSY Left 10/26/2017   Procedure: LEFT  MASTECTOMY WITH SEED TARGETED  LEFT AXILLARY LYMPH NODE EXCISION AND LEFT SENTINEL LYMPH NODE BIOPSY;  Surgeon: Almond Lint, MD;  Location: [facility];  Service: General;  Laterality: Left;   TONSILLECTOMY     WISDOM TOOTH EXTRACTION      Current Medications: Current Meds  Medication Sig   Calcium 500-100 MG-UNIT CHEW Chew 1 tablet by mouth daily.   furosemide (LASIX) 20 MG tablet Take 1 tablet (20 mg total) by mouth daily. If needed may take 1/2 tab q am   levothyroxine (SYNTHROID) 112 MCG tablet Take 1 tablet by mouth daily.   sucralfate (CARAFATE) 1 g tablet Take 1 tablet (1 g total) by mouth 4 (four) times daily -  with meals and at bedtime. Dissolve in warm water, swish and swallow   vitamin C (ASCORBIC ACID) 250 MG tablet Take 1 tablet (250 mg total) by mouth daily.     Allergies:   Percocet [oxycodone-acetaminophen]   Social History   Socioeconomic History   Marital status: Single    Spouse name: Not on file   Number of children: Not on file   Years of education: Not on file   Highest education level: Not on file  Occupational History   Not on file  Tobacco Use   Smoking status: Never   Smokeless tobacco: Never  Vaping Use   Vaping status: Never Used  Substance and Sexual Activity   Alcohol use: Yes    Alcohol/week: 0.0  standard drinks of alcohol    Comment: social   Drug use: Never   Sexual activity: Not on file  Other Topics Concern   Not on file  Social History Narrative   Lives alone and one dog.   Social Determinants of Health   Financial Resource Strain: Not on file  Food Insecurity: No Food Insecurity (09/14/2022)   Hunger Vital Sign    Worried About Running Out of Food in the Last Year: Never true    Ran Out of Food in the Last Year: Never true  Transportation Needs: No Transportation Needs (09/14/2022)   PRAPARE - Administrator, Civil Service (Medical): No    Lack of Transportation (Non-Medical): No  Physical Activity: Not  on file  Stress: Not on file  Social Connections: Not on file     Family History: The patient's family history includes Breast cancer (age of onset: 84) in her mother; COPD in her paternal grandfather; Dementia in her maternal grandmother; Stroke in her sister.  ROS:   Please see the history of present illness.     All other systems reviewed and are negative.  EKGs/Labs/Other Studies Reviewed:    The following studies were reviewed today:  EKG Interpretation Date/Time:  Monday December 28 2022 14:26:25 EDT Ventricular Rate:  90 PR Interval:  124 QRS Duration:  64 QT Interval:  360 QTC Calculation: 440 R Axis:   42  Text Interpretation: Normal sinus rhythm Nonspecific ST abnormality Confirmed by Debbe Odea ([id number]) on 12/28/2022 2:29:52 PM    Recent Labs: 10/13/2022: TSH 1.980 12/24/2022: ALT 10; BUN 5; Creatinine 0.55; Hemoglobin 11.4; Platelet Count 316; Potassium 3.7; Sodium 137  Recent Lipid Panel No results found for: "CHOL", "TRIG", "HDL", "CHOLHDL", "VLDL", "LDLCALC", "LDLDIRECT"   Risk Assessment/Calculations:             Physical Exam:    VS:  BP 108/84 (BP Location: Left Arm, Patient Position: Sitting, Cuff Size: Normal)   Pulse 90   Ht 5\' 10"  (1.778 m)   Wt 141 lb 9.6 oz (64.2 kg)   SpO2 98%   BMI 20.32 kg/m     Wt Readings from Last 3 Encounters:  12/28/22 141 lb 9.6 oz (64.2 kg)  12/24/22 143 lb (64.9 kg)  12/01/22 142 lb (64.4 kg)     GEN:  Well nourished, well developed in no acute distress HEENT: Normal NECK: No JVD; No carotid bruits CARDIAC: RRR, no murmurs, rubs, gallops RESPIRATORY:  Clear to auscultation without rales, wheezing or rhonchi  ABDOMEN: Soft, non-tender, non-distended MUSCULOSKELETAL:  No edema; No deformity  SKIN: Warm and dry NEUROLOGIC:  Alert and oriented x 3 PSYCHIATRIC:  Normal affect   ASSESSMENT:    1. Tachycardia   2. Esophagitis    PLAN:    In order of problems listed above:  Tachycardia  associated with exertion, heart rates up to 130 consistent with deconditioning or appropriate chronotropic response with exertion.  EKG today showing sinus rhythm, heart rate 90.  No indication for additional cardiac testing.  Patient educated on chronotropic response, deconditioning which might prompt brief elevation in heart rates.  Patient reassured. History of reflux/esophagitis from RT therapy.  PPI and Carafate as per primary/oncology team.  Follow-up as needed      Medication Adjustments/Labs and Tests Ordered: Current medicines are reviewed at length with the patient today.  Concerns regarding medicines are outlined above.  Orders Placed This Encounter  Procedures   EKG 12-Lead   No orders  of the defined types were placed in this encounter.   Patient Instructions  Medication Instructions:   Your physician recommends that you continue on your current medications as directed. Please refer to the Current Medication list given to you today.  *If you need a refill on your cardiac medications before your next appointment, please call your pharmacy*   Lab Work:  None Ordered  If you have labs (blood work) drawn today and your tests are completely normal, you will receive your results only by: MyChart Message (if you have MyChart) OR A paper copy in the mail If you have any lab test that is abnormal or we need to change your treatment, we will call you to review the results.   Testing/Procedures:  None Ordered    Follow-Up: At [facility], you and your health needs are our priority.  As part of our continuing mission to provide you with exceptional heart care, we have created designated Provider Care Teams.  These Care Teams include your primary Cardiologist (physician) and Advanced Practice Providers (APPs -  Physician Assistants and Nurse Practitioners) who all work together to provide you with the care you need, when you need it.  We recommend signing up for  the patient portal called "MyChart".  Sign up information is provided on this After Visit Summary.  MyChart is used to connect with patients for Virtual Visits (Telemedicine).  Patients are able to view lab/test results, encounter notes, upcoming appointments, etc.  Non-urgent messages can be sent to your provider as well.   To learn more about what you can do with MyChart, go to [email or web address].    Your next appointment:    AS NEEDED    Signed, Debbe Odea, MD  12/28/2022 2:59 PM    [facility]

## 2022-12-28 NOTE — Patient Instructions (Signed)
Medication Instructions:   Your physician recommends that you continue on your current medications as directed. Please refer to the Current Medication list given to you today.  *If you need a refill on your cardiac medications before your next appointment, please call your pharmacy*   Lab Work:  None Ordered  If you have labs (blood work) drawn today and your tests are completely normal, you will receive your results only by: MyChart Message (if you have MyChart) OR A paper copy in the mail If you have any lab test that is abnormal or we need to change your treatment, we will call you to review the results.   Testing/Procedures:  None Ordered   Follow-Up: At [facility], you and your health needs are our priority.  As part of our continuing mission to provide you with exceptional heart care, we have created designated Provider Care Teams.  These Care Teams include your primary Cardiologist (physician) and Advanced Practice Providers (APPs -  Physician Assistants and Nurse Practitioners) who all work together to provide you with the care you need, when you need it.  We recommend signing up for the patient portal called "MyChart".  Sign up information is provided on this After Visit Summary.  MyChart is used to connect with patients for Virtual Visits (Telemedicine).  Patients are able to view lab/test results, encounter notes, upcoming appointments, etc.  Non-urgent messages can be sent to your provider as well.   To learn more about what you can do with MyChart, go to [email or web address].    Your next appointment:  AS NEEDED

## 2022-12-29 ENCOUNTER — Encounter: Payer: Self-pay | Admitting: Hematology and Oncology

## 2022-12-29 NOTE — Telephone Encounter (Signed)
Error. Nam Vossler M Aleka Twitty, RN  

## 2022-12-30 ENCOUNTER — Ambulatory Visit: Payer: Commercial Managed Care - PPO | Admitting: Physical Therapy

## 2022-12-30 ENCOUNTER — Telehealth: Payer: Self-pay | Admitting: *Deleted

## 2022-12-30 NOTE — Telephone Encounter (Signed)
Received call from pt with complaint of "water in my ear".  Pt denies any pain, fever or drainage but states she can "hear the water sloshing around".  MD out of office, RN encouraged pt to seek care with PCP for further evaluation. Pt verbalized understanding.

## 2022-12-31 ENCOUNTER — Ambulatory Visit: Payer: Medicaid Other | Admitting: Physical Therapy

## 2022-12-31 DIAGNOSIS — M542 Cervicalgia: Secondary | ICD-10-CM

## 2022-12-31 DIAGNOSIS — M436 Torticollis: Secondary | ICD-10-CM

## 2022-12-31 DIAGNOSIS — M25612 Stiffness of left shoulder, not elsewhere classified: Secondary | ICD-10-CM

## 2022-12-31 DIAGNOSIS — R29898 Other symptoms and signs involving the musculoskeletal system: Secondary | ICD-10-CM

## 2022-12-31 DIAGNOSIS — M25562 Pain in left knee: Secondary | ICD-10-CM

## 2022-12-31 DIAGNOSIS — R262 Difficulty in walking, not elsewhere classified: Secondary | ICD-10-CM | POA: Diagnosis not present

## 2022-12-31 DIAGNOSIS — M25611 Stiffness of right shoulder, not elsewhere classified: Secondary | ICD-10-CM

## 2022-12-31 DIAGNOSIS — M6281 Muscle weakness (generalized): Secondary | ICD-10-CM

## 2022-12-31 NOTE — Therapy (Signed)
OUTPATIENT PHYSICAL THERAPY NEURO EVALUATION   Patient Name: Kathryn Lucas MRN: [id number] DOB:May 31, 1960, 62 y.o., female Today's Date: 12/31/2022   PCP: Ollen Bowl, MD  REFERRING PROVIDER: [name], MD   END OF SESSION:  PT End of Session - 12/31/22 1500     Visit Number 7    Number of Visits 24    Date for PT Re-Evaluation 03/01/23    PT Start Time 1450    PT Stop Time 1530    PT Time Calculation (min) 40 min    Equipment Utilized During Treatment Gait belt    Behavior During Therapy WFL for tasks assessed/performed             Past Medical History:  Diagnosis Date   Breast cancer (HCC)    left breast cancer   Cancer (HCC) 09/2017   left breast cancer   Family history of breast cancer    Hypothyroidism    Personal history of radiation therapy 2019   Thyroid disease    Past Surgical History:  Procedure Laterality Date   BREAST BIOPSY Left 2018   BREAST BIOPSY Left 2019   BREAST RECONSTRUCTION WITH PLACEMENT OF TISSUE EXPANDER AND ALLODERM Left 10/26/2017   Procedure: LEFT BREAST RECONSTRUCTION WITH PLACEMENT OF TISSUE EXPANDER AND ALLODERM;  Surgeon: Glenna Fellows, MD;  Location: [facility];  Service: Plastics;  Laterality: Left;   MASTECTOMY Left 2019   MASTECTOMY WITH RADIOACTIVE SEED GUIDED EXCISION AND AXILLARY SENTINEL LYMPH NODE BIOPSY Left 10/26/2017   Procedure: LEFT MASTECTOMY WITH SEED TARGETED  LEFT AXILLARY LYMPH NODE EXCISION AND LEFT SENTINEL LYMPH NODE BIOPSY;  Surgeon: Almond Lint, MD;  Location: [facility];  Service: General;  Laterality: Left;   TONSILLECTOMY     WISDOM TOOTH EXTRACTION     Patient Active Problem List   Diagnosis Date Noted   Metastatic 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) 10/14/2022   Elevated liver enzymes 10/14/2022   Unexplained weight loss 10/14/2022   Neoplasm related pain 10/14/2022   Metastatic malignant neoplasm (HCC) 12/19/2021   Family history of breast cancer    History of  therapeutic radiation 04/27/2018   Breast cancer, left breast (HCC) 10/26/2017   Malignant neoplasm of lower-outer quadrant of left breast of female, estrogen receptor positive (HCC) 07/15/2016   Plantar fasciitis 07/30/2015   Metatarsalgia 10/18/2014    ONSET DATE: September 2023   REFERRING DIAG:  Diagnosis  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 Felt like she had water in ear over the last couple days, but was seen by MD and told it was air in her "ear tube" . No other complaints.   Pt accompanied by:  care giver   PERTINENT HISTORY:   Pt is 62 y.o. female that is seeking therapy for weakness of the R LE. Pt tripped over the threshold of her door back in September of 2023, and had a transverse fracture of the L patella when she fell on it. She was recently diagnosed with bone cancer back in October of 2023. Pt notes that she has more stiffness in the neck and pelvis region because the cancer is located in those locations.   Pt had exacerbation of s/s in June of 2024 with hospitalization and subsequent change in oncology treatment limiting ability to attend PT. Pt has responded well to treatment and now able to attend PT.   PAIN:  Are you having pain? Yes: NPRS scale: 2/10 Pain location: neck and shoulders  Pain description: stiffness  Aggravating factors:  standing  Relieving factors: small pillows   PRECAUTIONS: Cervical and Other: cervical for comfort   RED FLAGS: None   WEIGHT BEARING RESTRICTIONS: No  FALLS: Has patient fallen in last 6 months? Yes. Number of falls 1  LIVING ENVIRONMENT: Lives with: lives alone Lives in: House/apartment Stairs:  yes, but uses ramp  Has following equipment at home: Walker - 4 wheeled  PLOF: Needs assistance with ADLs and Needs assistance with homemaking  PATIENT GOALS: improve balance, strength and endurance. Stand at kitchen longer and   OBJECTIVE:   DIAGNOSTIC FINDINGS:   EXAM: BILATERAL LOWER EXTREMITY VENOUS DOPPLER ULTRASOUND IMPRESSION: Negative.  EXAM: CT HEAD WITHOUT AND WITH CONTRAST IMPRESSION: 1. Extensive osseous metastatic disease throughout the calvarium. Extraosseous soft tissue tumor in the right orbit appears improved, while the additional areas of extraosseous tumor described in detail above are otherwise overall not significantly changed. 2. Tumor is again inseparable from the anterior aspect of the superior sagittal sinus and left sigmoid sinus, unchanged. 3. Decreased but not resolved infiltrative thickening of the left inferior and medial rectus muscles. 4. Unchanged innumerable scalp nodules, nonspecific but possibly metastatic lesions.     COGNITION: Overall cognitive status: Within functional limits for tasks assessed   SENSATION: WFL  COORDINATION: Limited due to ROM deficits. WFL    MUSCLE TONE: WFL    POSTURE: rounded shoulders, forward head, and decreased lumbar lordosis  LOWER EXTREMITY MMT:   MMT  Right Eval Left Eval  Hip flexion 4 4  Hip extension    Hip abduction 4 4  Hip adduction 4+ 4+  Hip internal rotation    Hip external rotation    Knee flexion 4 4  Knee extension 4+ 4+  Ankle dorsiflexion 5 5  Ankle plantarflexion 4 4  Ankle inversion    Ankle eversion     (Blank rows = not tested)  LOWER EXTREMITY ROM:     To  be tested BLE and BUE   BED MOBILITY:  Sit to supine SBA Supine to sit SBA  TRANSFERS: Assistive device utilized: None  Sit to stand: Modified independence Stand to sit: Modified independence Chair to chair: Modified independence Floor: Modified independence   CURB:  Level of Assistance: CGA Assistive device utilized:  Youth worker Comments: step to pattern  STAIRS: Level of Assistance: CGA Stair Negotiation Technique: Step to Pattern with Bilateral Rails Number of Stairs: 4  Height of Stairs: 6  Comments: step to with BUE support   GAIT: Gait pattern: step through pattern and circumduction- Right Distance walked: 172ft Assistive device utilized: None Level of assistance: SBA Comments: mild R LE circumduction, but greatly improved since last bout of PT treatment.   FUNCTIONAL TESTS:  5 times sit to stand: 15.82 Timed up and go (TUG): 15.72 6 minute walk test: TBD 10 meter walk test: 10.225sec (0.61m/s)  Berg Balance Scale: 4656 Functional gait assessment: TBD.   PATIENT SURVEYS:  FOTO 63 ( goal 68)  TODAY'S TREATMENT:                                                                                                                              DATE: 12/31/2022   UBE 2 min forward. 2 min reverse with 1 min rst break between bouts.  Gait without resistance. X 41ft. Mild SOB upon completion.  Partial squat with UE supported on rail at wall with cues to go as low as BUE will allow.  Step up 6inch step x 8 bil  Sit<>stand with push from thighs with mina assist from PT x 5.  Sit<>supine without assist from PT. Pt choosing to go through long sitting in to sitting EOB.  Supine shoulder ROM with PVC pipe in to shoulder flexion within pain free range. 5 sec hold x 5  Shoulder abduction with cane on the L side and RUE controlling ROM. 5 sec hold x 6   Cues for pain free ROM in UE AAROM therex.     Throughout session , pt required multiple seated rest breaks due to BLE  fatigue, but recoverd quickly and able to continue without issue.    PATIENT EDUCATION: Education details: POC. Clinic orientation. Pt educated throughout session about proper posture and technique with exercises. Improved exercise technique, movement at target joints, use of target muscles after min to mod verbal, visual, tactile cues.  Person educated: Patient and Caregiver MAry Education method: Explanation Education comprehension: verbalized understanding  HOME EXERCISE PROGRAM: Access Code: [email or web address] URL: [email or web address] Date: 12/14/2022 Prepared by: Grier Rocher  Exercises - Mini Squat with Counter Support  - 1 x daily - 7 x weekly - 3 sets - 10 reps - Standing Hip Abduction with Counter Support  - 1 x daily - 7 x weekly - 3 sets - 10 reps - Standing March with Counter Support  - 1 x daily - 7 x weekly - 3 sets - 10 reps - Seated Long Arc Quad  - 1 x daily - 7 x weekly - 3 sets - 10 reps  GOALS: Goals reviewed with patient? Yes  SHORT TERM GOALS: Target date: 01/13/2023    Patient will be independent in home exercise program to improve strength/mobility for better functional independence with ADLs. Baseline:to  be given at next session  Goal status: INITIAL   LONG TERM GOALS: Target date: 03/03/2023    Patient will increase FOTO score  to equal to or greater than  68   to demonstrate statistically significant improvement in mobility and quality of life.  Baseline: 63  Goal status: INITIAL  2.  Patient (> 6 years old) will complete five times sit to stand test in < 15 seconds indicating an increased LE strength and improved balance. Baseline: 15.82 with UE support  Goal status: INITIAL  3.  Patient will increase Berg Balance score by > 6 points to demonstrate decreased fall risk during functional activities Baseline: 48 Goal status: INITIAL  4.  Patient will increase 6 min walk test by 144ft  >1.14m/s as to improve gait speed for better  community ambulation and to reduce fall risk. Baseline: 950 Goal status: INITIAL  5.  Patient will reduce timed up and go to <11 seconds to reduce fall risk and demonstrate improved transfer/gait ability. Baseline: 15.72 Goal status: INITIAL  6.  Patient will increase FGA score to >22 as to demonstrate reduced fall risk and improved dynamic gait balance for better safety with community/home ambulation.   Baseline:19 Goal status: INITIAL   ASSESSMENT:  CLINICAL IMPRESSION: Patient  seen today for physical therapy treatment for balance, strength, and endurance deficits secondary to CA treatment and prolonged immobility. PT treatment focused on improved shoulder ROM and dynamic BLE strengthening/endurance training. Pt continues to demonstrate reduced ROM in BUE but feels like she is able to utilize BUE more functionally with ADLs.  Pt will benefit from skilled PT to address balance, strength and ROM deficits, improve safety and QoL while continuing CA treatment.       OBJECTIVE IMPAIRMENTS: Abnormal gait, cardiopulmonary status limiting activity, decreased activity tolerance, decreased balance, decreased endurance, decreased knowledge of condition, decreased mobility, difficulty walking, decreased ROM, decreased strength, hypomobility, increased fascial restrictions, impaired perceived functional ability, impaired flexibility, impaired UE functional use, improper body mechanics, postural dysfunction, and pain.   ACTIVITY LIMITATIONS: carrying, lifting, bending, standing, squatting, sleeping, stairs, transfers, bathing, toileting, dressing, reach over head, and locomotion level  PARTICIPATION LIMITATIONS: cleaning, laundry, driving, shopping, community activity, and yard work  PERSONAL FACTORS: Age, Behavior pattern, Fitness, Past/current experiences, and 1-2 comorbidities: CA with profound metastases   are also affecting patient's functional outcome.   REHAB POTENTIAL: Good  CLINICAL  DECISION MAKING: Evolving/moderate complexity  EVALUATION COMPLEXITY: Moderate  PLAN:  PT FREQUENCY: 1-2x/week  PT DURATION: 12 weeks  PLANNED INTERVENTIONS: Therapeutic exercises, Therapeutic activity, Neuromuscular re-education, Balance training, Gait training, Patient/Family education, Self Care, Joint mobilization, Stair training, DME instructions, Cryotherapy, Moist heat, and Manual therapy  PLAN FOR NEXT SESSION:   BLE strengthening and balance training.  UE and cervical ROM as tolerated.    Golden Pop, PT 12/31/2022, 3:31 PM

## 2023-01-04 ENCOUNTER — Ambulatory Visit: Payer: Commercial Managed Care - PPO | Admitting: Physical Therapy

## 2023-01-05 ENCOUNTER — Ambulatory Visit: Payer: Medicaid Other | Admitting: Physical Therapy

## 2023-01-05 DIAGNOSIS — R262 Difficulty in walking, not elsewhere classified: Secondary | ICD-10-CM | POA: Diagnosis not present

## 2023-01-05 DIAGNOSIS — M25611 Stiffness of right shoulder, not elsewhere classified: Secondary | ICD-10-CM

## 2023-01-05 DIAGNOSIS — M436 Torticollis: Secondary | ICD-10-CM

## 2023-01-05 DIAGNOSIS — M25562 Pain in left knee: Secondary | ICD-10-CM

## 2023-01-05 DIAGNOSIS — M542 Cervicalgia: Secondary | ICD-10-CM

## 2023-01-05 DIAGNOSIS — M25612 Stiffness of left shoulder, not elsewhere classified: Secondary | ICD-10-CM

## 2023-01-05 DIAGNOSIS — R29898 Other symptoms and signs involving the musculoskeletal system: Secondary | ICD-10-CM

## 2023-01-05 DIAGNOSIS — M6281 Muscle weakness (generalized): Secondary | ICD-10-CM

## 2023-01-05 NOTE — Therapy (Signed)
OUTPATIENT PHYSICAL THERAPY NEURO EVALUATION   Patient Name: Kathryn Lucas MRN: [id number] DOB:13-Mar-1961, 62 y.o., female Today's Date: 01/05/2023   PCP: Ollen Bowl, MD  REFERRING PROVIDER: [name], MD   END OF SESSION:  PT End of Session - 01/05/23 1202     Visit Number 8    Number of Visits 24    Date for PT Re-Evaluation 03/01/23    PT Start Time 1152    PT Stop Time 1230    PT Time Calculation (min) 38 min    Equipment Utilized During Treatment Gait belt    Behavior During Therapy WFL for tasks assessed/performed             Past Medical History:  Diagnosis Date   Breast cancer (HCC)    left breast cancer   Cancer (HCC) 09/2017   left breast cancer   Family history of breast cancer    Hypothyroidism    Personal history of radiation therapy 2019   Thyroid disease    Past Surgical History:  Procedure Laterality Date   BREAST BIOPSY Left 2018   BREAST BIOPSY Left 2019   BREAST RECONSTRUCTION WITH PLACEMENT OF TISSUE EXPANDER AND ALLODERM Left 10/26/2017   Procedure: LEFT BREAST RECONSTRUCTION WITH PLACEMENT OF TISSUE EXPANDER AND ALLODERM;  Surgeon: Glenna Fellows, MD;  Location: [facility];  Service: Plastics;  Laterality: Left;   MASTECTOMY Left 2019   MASTECTOMY WITH RADIOACTIVE SEED GUIDED EXCISION AND AXILLARY SENTINEL LYMPH NODE BIOPSY Left 10/26/2017   Procedure: LEFT MASTECTOMY WITH SEED TARGETED  LEFT AXILLARY LYMPH NODE EXCISION AND LEFT SENTINEL LYMPH NODE BIOPSY;  Surgeon: Almond Lint, MD;  Location: [facility];  Service: General;  Laterality: Left;   TONSILLECTOMY     WISDOM TOOTH EXTRACTION     Patient Active Problem List   Diagnosis Date Noted   Metastatic 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) 10/14/2022   Elevated liver enzymes 10/14/2022   Unexplained weight loss 10/14/2022   Neoplasm related pain 10/14/2022   Metastatic malignant neoplasm (HCC) 12/19/2021   Family history of breast cancer    History of  therapeutic radiation 04/27/2018   Breast cancer, left breast (HCC) 10/26/2017   Malignant neoplasm of lower-outer quadrant of left breast of female, estrogen receptor positive (HCC) 07/15/2016   Plantar fasciitis 07/30/2015   Metatarsalgia 10/18/2014    ONSET DATE: September 2023   REFERRING DIAG:  Diagnosis  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 No pain throughout PT session. Still reports some tightness in the Cspine when waking up some mornings, but significantly improved over the last several weeks.   Pt accompanied by:  care giver   PERTINENT HISTORY:   Pt is 62 y.o. female that is seeking therapy for weakness of the R LE. Pt tripped over the threshold of her door back in September of 2023, and had a transverse fracture of the L patella when she fell on it. She was recently diagnosed with bone cancer back in October of 2023. Pt notes that she has more stiffness in the neck and pelvis region because the cancer is located in those locations.   Pt had exacerbation of s/s in June of 2024 with hospitalization and subsequent change in oncology treatment limiting ability to attend PT. Pt has responded well to treatment and now able to attend PT.   PAIN:  Are you having pain? Yes: NPRS scale: 2/10 Pain location: neck and shoulders  Pain description: stiffness   Aggravating factors: standing  Relieving factors: small pillows   PRECAUTIONS: Cervical and Other: cervical for comfort   RED FLAGS: None   WEIGHT BEARING RESTRICTIONS: No  FALLS: Has patient fallen in last 6 months? Yes. Number of falls 1  LIVING ENVIRONMENT: Lives with: lives alone Lives in: House/apartment Stairs:  yes, but uses ramp  Has following equipment at home: Walker - 4 wheeled  PLOF: Needs assistance with ADLs and Needs assistance with homemaking  PATIENT GOALS: improve balance, strength and endurance. Stand at kitchen longer and   OBJECTIVE:   DIAGNOSTIC FINDINGS:   EXAM: BILATERAL LOWER EXTREMITY VENOUS DOPPLER ULTRASOUND IMPRESSION: Negative.  EXAM: CT HEAD WITHOUT AND WITH CONTRAST IMPRESSION: 1. Extensive osseous metastatic disease throughout the calvarium. Extraosseous soft tissue tumor in the right orbit appears improved, while the additional areas of extraosseous tumor described in detail above are otherwise overall not significantly changed. 2. Tumor is again inseparable from the anterior aspect of the superior sagittal sinus and left sigmoid sinus, unchanged. 3. Decreased but not resolved infiltrative thickening of the left inferior and medial rectus muscles. 4. Unchanged innumerable scalp nodules, nonspecific but possibly metastatic lesions.     COGNITION: Overall cognitive status: Within functional limits for tasks assessed   SENSATION: WFL  COORDINATION: Limited due to ROM deficits. WFL    MUSCLE TONE: WFL    POSTURE: rounded shoulders, forward head, and decreased lumbar lordosis  LOWER EXTREMITY MMT:   MMT  Right Eval Left Eval  Hip flexion 4 4  Hip extension    Hip abduction 4 4  Hip adduction 4+ 4+  Hip internal rotation    Hip external rotation    Knee flexion 4 4  Knee extension 4+ 4+  Ankle dorsiflexion 5 5  Ankle plantarflexion 4 4  Ankle inversion    Ankle eversion     (Blank rows = not tested)  LOWER  EXTREMITY ROM:     To be tested BLE and BUE   BED MOBILITY:  Sit to supine SBA Supine to sit SBA  TRANSFERS: Assistive device utilized: None  Sit to stand: Modified independence Stand to sit: Modified independence Chair to chair: Modified independence Floor: Modified independence   CURB:  Level of Assistance: CGA Assistive device utilized:  Youth worker Comments: step to pattern  STAIRS: Level of Assistance: CGA Stair Negotiation Technique: Step to Pattern with Bilateral Rails Number of Stairs: 4  Height of Stairs: 6  Comments: step to with BUE support   GAIT: Gait pattern: step through pattern and circumduction- Right Distance walked: 194ft Assistive device utilized: None Level of assistance: SBA Comments: mild R LE circumduction, but greatly improved since last bout of PT treatment.   FUNCTIONAL TESTS:  5 times sit to stand: 15.82 Timed up and go (TUG): 15.72 6 minute walk test: TBD 10 meter walk test: 10.225sec (0.6m/s)  Berg Balance Scale: 4656 Functional gait assessment: TBD.   PATIENT SURVEYS:  FOTO 63 ( goal 68)  TODAY'S TREATMENT:                                                                                                                              DATE: 01/05/2023   UBE 2 min forward. 2 min reverse with 1 min rst break between bouts.   Side stepping in parallel bars with 4#AW x 4 laps Forward/reverse with 4# AW x 4 laps in parallel bars  Side stepping over 4inch hurdle x 10 bil 4# AW UE assisted squat in parallel bars cues for UE position and use of lats to pull back ot standing. X 12 Calf raise 4#AW x 15  Toe raise 4#AW x 15.  Hip flexion 4#AW x15 Shoulder extension x 12 YTB Shoulder flexion x 5 YTB, mild tightness reported so exercise d/c'ed.   Gait through rehab gym with 4#AW x 66ft+20ft x 2. No LOB or instablity noted. Also noted to have minimal trunk sway with BLE advancement.   Throughout session , pt required multiple seated rest  breaks due to BLE fatigue, but recoverd quickly and able to continue without issue.    PATIENT EDUCATION: Education details: Pt educated throughout session about proper posture and technique with exercises. Improved exercise technique, movement at target joints, use of target muscles after min to mod verbal, visual, tactile cues.  Person educated: Patient and Caregiver MAry Education method: Explanation Education comprehension: verbalized understanding  HOME EXERCISE PROGRAM: Access Code: [email or web address] URL: [email or web address] Date: 12/14/2022 Prepared by: Grier Rocher  Exercises - Mini Squat with Counter Support  - 1 x daily - 7 x weekly - 3 sets - 10 reps - Standing Hip Abduction with Counter Support  - 1 x daily - 7 x weekly - 3 sets - 10 reps - Standing March with Counter Support  - 1 x daily - 7 x weekly - 3 sets - 10 reps - Seated Long Arc Quad  - 1 x daily - 7 x weekly - 3 sets - 10 reps  GOALS: Goals reviewed with patient? Yes  SHORT TERM GOALS: Target date: 01/13/2023    Patient will be independent in home exercise program to improve strength/mobility for better functional independence with ADLs. Baseline:to  be given at next session  Goal status: INITIAL   LONG TERM GOALS: Target date: 03/03/2023    Patient will increase FOTO score to equal to or greater than  68   to demonstrate statistically significant  improvement in mobility and quality of life.  Baseline: 63  Goal status: INITIAL  2.  Patient (> 34 years old) will complete five times sit to stand test in < 15 seconds indicating an increased LE strength and improved balance. Baseline: 15.82 with UE support  Goal status: INITIAL  3.  Patient will increase Berg Balance score by > 6 points to demonstrate decreased fall risk during functional activities Baseline: 48 Goal status: INITIAL  4.  Patient will increase 6 min walk test by 121ft  >1.55m/s as to improve gait speed for better community  ambulation and to reduce fall risk. Baseline: 950 Goal status: INITIAL  5.  Patient will reduce timed up and go to <11 seconds to reduce fall risk and demonstrate improved transfer/gait ability. Baseline: 15.72 Goal status: INITIAL  6.  Patient will increase FGA score to >22 as to demonstrate reduced fall risk and improved dynamic gait balance for better safety with community/home ambulation.   Baseline:19 Goal status: INITIAL   ASSESSMENT:  CLINICAL IMPRESSION: Patient  seen today for physical therapy treatment for balance, strength, and endurance deficits secondary to CA treatment and prolonged immobility. PT treatment focused on improved shoulder ROM and BLE strengthening. Mild discomfort with shoulder FLexion with resistance, so exercise d/c'ed. Noted to have improved sequencing of gait pattern with decreased trunkal sway with limb advancement in all planes on this day.   Pt will benefit from skilled PT to address balance, strength and ROM deficits, improve safety and QoL while continuing CA treatment.       OBJECTIVE IMPAIRMENTS: Abnormal gait, cardiopulmonary status limiting activity, decreased activity tolerance, decreased balance, decreased endurance, decreased knowledge of condition, decreased mobility, difficulty walking, decreased ROM, decreased strength, hypomobility, increased fascial restrictions, impaired perceived functional ability, impaired flexibility, impaired UE functional use, improper body mechanics, postural dysfunction, and pain.   ACTIVITY LIMITATIONS: carrying, lifting, bending, standing, squatting, sleeping, stairs, transfers, bathing, toileting, dressing, reach over head, and locomotion level  PARTICIPATION LIMITATIONS: cleaning, laundry, driving, shopping, community activity, and yard work  PERSONAL FACTORS: Age, Behavior pattern, Fitness, Past/current experiences, and 1-2 comorbidities: CA with profound metastases   are also affecting patient's functional  outcome.   REHAB POTENTIAL: Good  CLINICAL DECISION MAKING: Evolving/moderate complexity  EVALUATION COMPLEXITY: Moderate  PLAN:  PT FREQUENCY: 1-2x/week  PT DURATION: 12 weeks  PLANNED INTERVENTIONS: Therapeutic exercises, Therapeutic activity, Neuromuscular re-education, Balance training, Gait training, Patient/Family education, Self Care, Joint mobilization, Stair training, DME instructions, Cryotherapy, Moist heat, and Manual therapy  PLAN FOR NEXT SESSION:   Continue BLE strengthening and balance training.  UE and cervical ROM as tolerated.    Golden Pop, PT 01/05/2023, 12:35 PM

## 2023-01-06 ENCOUNTER — Ambulatory Visit: Payer: Commercial Managed Care - PPO | Admitting: Physical Therapy

## 2023-01-07 ENCOUNTER — Ambulatory Visit: Payer: Medicaid Other | Admitting: Physical Therapy

## 2023-01-07 ENCOUNTER — Telehealth: Payer: Self-pay | Admitting: *Deleted

## 2023-01-07 DIAGNOSIS — R29898 Other symptoms and signs involving the musculoskeletal system: Secondary | ICD-10-CM

## 2023-01-07 DIAGNOSIS — M25611 Stiffness of right shoulder, not elsewhere classified: Secondary | ICD-10-CM

## 2023-01-07 DIAGNOSIS — M542 Cervicalgia: Secondary | ICD-10-CM

## 2023-01-07 DIAGNOSIS — M436 Torticollis: Secondary | ICD-10-CM

## 2023-01-07 DIAGNOSIS — R262 Difficulty in walking, not elsewhere classified: Secondary | ICD-10-CM

## 2023-01-07 DIAGNOSIS — M25562 Pain in left knee: Secondary | ICD-10-CM

## 2023-01-07 DIAGNOSIS — M6281 Muscle weakness (generalized): Secondary | ICD-10-CM

## 2023-01-07 DIAGNOSIS — M25612 Stiffness of left shoulder, not elsewhere classified: Secondary | ICD-10-CM

## 2023-01-07 NOTE — Therapy (Signed)
OUTPATIENT PHYSICAL THERAPY NEURO EVALUATION   Patient Name: Kathryn Lucas MRN: [id number] DOB:1960/06/17, 62 y.o., female Today's Date: 01/07/2023   PCP: Ollen Bowl, MD  REFERRING PROVIDER: [name], MD   END OF SESSION:  PT End of Session - 01/07/23 1518     Visit Number 9    Number of Visits 24    Date for PT Re-Evaluation 03/01/23    PT Start Time 1401    PT Stop Time 1445    PT Time Calculation (min) 44 min    Equipment Utilized During Treatment Gait belt    Behavior During Therapy WFL for tasks assessed/performed             Past Medical History:  Diagnosis Date   Breast cancer (HCC)    left breast cancer   Cancer (HCC) 09/2017   left breast cancer   Family history of breast cancer    Hypothyroidism    Personal history of radiation therapy 2019   Thyroid disease    Past Surgical History:  Procedure Laterality Date   BREAST BIOPSY Left 2018   BREAST BIOPSY Left 2019   BREAST RECONSTRUCTION WITH PLACEMENT OF TISSUE EXPANDER AND ALLODERM Left 10/26/2017   Procedure: LEFT BREAST RECONSTRUCTION WITH PLACEMENT OF TISSUE EXPANDER AND ALLODERM;  Surgeon: Glenna Fellows, MD;  Location: [facility];  Service: Plastics;  Laterality: Left;   MASTECTOMY Left 2019   MASTECTOMY WITH RADIOACTIVE SEED GUIDED EXCISION AND AXILLARY SENTINEL LYMPH NODE BIOPSY Left 10/26/2017   Procedure: LEFT MASTECTOMY WITH SEED TARGETED  LEFT AXILLARY LYMPH NODE EXCISION AND LEFT SENTINEL LYMPH NODE BIOPSY;  Surgeon: Almond Lint, MD;  Location: [facility];  Service: General;  Laterality: Left;   TONSILLECTOMY     WISDOM TOOTH EXTRACTION     Patient Active Problem List   Diagnosis Date Noted   Metastatic 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) 10/14/2022   Elevated liver enzymes 10/14/2022   Unexplained weight loss 10/14/2022   Neoplasm related pain 10/14/2022   Metastatic malignant neoplasm (HCC) 12/19/2021   Family history of breast cancer    History of  therapeutic radiation 04/27/2018   Breast cancer, left breast (HCC) 10/26/2017   Malignant neoplasm of lower-outer quadrant of left breast of female, estrogen receptor positive (HCC) 07/15/2016   Plantar fasciitis 07/30/2015   Metatarsalgia 10/18/2014    ONSET DATE: September 2023   REFERRING DIAG:  Diagnosis  C79.51 (ICD-10-CM) - Secondary malignant neoplasm of bone    THERAPY DIAG:  Difficulty in walking, not elsewhere classified  Muscle weakness (generalized)  Stiffness of cervical spine  Decreased range of motion of left shoulder  Painful cervical ROM  Decreased range of motion of right shoulder  Leg weakness, bilateral  Acute pain of left knee  Rationale for Evaluation and Treatment: Rehabilitation  SUBJECTIVE:  SUBJECTIVE STATEMENT: Pt states that she is doing well. No pain throughout PT session. Still reports some tightness in the Cspine when waking up some mornings, but significantly improved over the last several weeks.   Pt accompanied by:  care giver   PERTINENT HISTORY:   Pt is 62 y.o. female that is seeking therapy for weakness of the R LE. Pt tripped over the threshold of her door back in September of 2023, and had a transverse fracture of the L patella when she fell on it. She was recently diagnosed with bone cancer back in October of 2023. Pt notes that she has more stiffness in the neck and pelvis region because the cancer is located in those locations.   Pt had exacerbation of s/s in June of 2024 with hospitalization and subsequent change in oncology treatment limiting ability to attend PT. Pt has responded well to treatment and now able to attend PT.   PAIN:  Are you having pain? Yes: NPRS scale: 2/10 Pain location: neck and shoulders  Pain description: stiffness   Aggravating factors: standing  Relieving factors: small pillows   PRECAUTIONS: Cervical and Other: cervical for comfort   RED FLAGS: None   WEIGHT BEARING RESTRICTIONS: No  FALLS: Has patient fallen in last 6 months? Yes. Number of falls 1  LIVING ENVIRONMENT: Lives with: lives alone Lives in: House/apartment Stairs:  yes, but uses ramp  Has following equipment at home: Walker - 4 wheeled  PLOF: Needs assistance with ADLs and Needs assistance with homemaking  PATIENT GOALS: improve balance, strength and endurance. Stand at kitchen longer and   OBJECTIVE:   DIAGNOSTIC FINDINGS:   EXAM: BILATERAL LOWER EXTREMITY VENOUS DOPPLER ULTRASOUND IMPRESSION: Negative.  EXAM: CT HEAD WITHOUT AND WITH CONTRAST IMPRESSION: 1. Extensive osseous metastatic disease throughout the calvarium. Extraosseous soft tissue tumor in the right orbit appears improved, while the additional areas of extraosseous tumor described in detail above are otherwise overall not significantly changed. 2. Tumor is again inseparable from the anterior aspect of the superior sagittal sinus and left sigmoid sinus, unchanged. 3. Decreased but not resolved infiltrative thickening of the left inferior and medial rectus muscles. 4. Unchanged innumerable scalp nodules, nonspecific but possibly metastatic lesions.     COGNITION: Overall cognitive status: Within functional limits for tasks assessed   SENSATION: WFL  COORDINATION: Limited due to ROM deficits. WFL    MUSCLE TONE: WFL    POSTURE: rounded shoulders, forward head, and decreased lumbar lordosis  LOWER EXTREMITY MMT:   MMT  Right Eval Left Eval  Hip flexion 4 4  Hip extension    Hip abduction 4 4  Hip adduction 4+ 4+  Hip internal rotation    Hip external rotation    Knee flexion 4 4  Knee extension 4+ 4+  Ankle dorsiflexion 5 5  Ankle plantarflexion 4 4  Ankle inversion    Ankle eversion     (Blank rows = not tested)  LOWER  EXTREMITY ROM:     To be tested BLE and BUE   BED MOBILITY:  Sit to supine SBA Supine to sit SBA  TRANSFERS: Assistive device utilized: None  Sit to stand: Modified independence Stand to sit: Modified independence Chair to chair: Modified independence Floor: Modified independence   CURB:  Level of Assistance: CGA Assistive device utilized:  Youth worker Comments: step to pattern  STAIRS: Level of Assistance: CGA Stair Negotiation Technique: Step to Pattern with Bilateral Rails Number of Stairs: 4  Height of Stairs: 6  Comments: step to with BUE support   GAIT: Gait pattern: step through pattern and circumduction- Right Distance walked: 158ft Assistive device utilized: None Level of assistance: SBA Comments: mild R LE circumduction, but greatly improved since last bout of PT treatment.   FUNCTIONAL TESTS:  5 times sit to stand: 15.82 Timed up and go (TUG): 15.72 6 minute walk test: TBD 10 meter walk test: 10.225sec (0.26m/s)  Berg Balance Scale: 4656 Functional gait assessment: TBD.   PATIENT SURVEYS:  FOTO 63 ( goal 68)  TODAY'S TREATMENT:                                                                                                                              DATE: 01/07/2023   UBE 2.5 min forward. 2.5 min reverse with 1 min rst break between bouts.   Weighted gait training with 5# ankle weights 2 x 333ft with cues for posture and improved step height to prevent heel drag on the LLE. Mild SOB upon completion of each bout.   Sit<>stand from chair with airex pad in seat; UE to push from thighs 2 x 10 therapeutic rest break between bouts.   Forward gait in matrix cable 7.5 # 3 x 20 ft.  Reverse gait in matric cable machine 3 x 87ft. 7.5 #  Throughout session , pt required multiple seated rest breaks due to BLE fatigue, but recoverd quickly and able to continue without issue.    PATIENT EDUCATION: Education details: Pt educated throughout session about  proper posture and technique with exercises. Improved exercise technique, movement at target joints, use of target muscles after min to mod verbal, visual, tactile cues.  Person educated: Patient and Caregiver MAry Education method: Explanation Education comprehension: verbalized understanding  HOME EXERCISE PROGRAM: Access Code: [email or web address] URL: [email or web address] Date: 12/14/2022 Prepared by: Grier Rocher  Exercises - Mini Squat with Counter Support  - 1 x daily - 7 x weekly - 3 sets - 10 reps - Standing Hip Abduction with Counter Support  - 1 x daily - 7 x weekly - 3 sets - 10 reps - Standing March with Counter Support  - 1 x daily - 7 x weekly - 3 sets - 10 reps - Seated Long Arc Quad  - 1 x daily - 7 x weekly - 3 sets - 10 reps  GOALS: Goals reviewed with patient? Yes  SHORT TERM GOALS: Target date: 01/13/2023    Patient will be independent in home exercise program to improve strength/mobility for better functional independence with ADLs. Baseline:to  be given at next session  Goal status: INITIAL   LONG TERM GOALS: Target date: 03/03/2023    Patient will increase FOTO score to equal to or greater than  68   to demonstrate statistically significant improvement in mobility and quality of life.  Baseline: 63  Goal status: INITIAL  2.  Patient (> 17 years old) will complete five times sit to stand test in <  15 seconds indicating an increased LE strength and improved balance. Baseline: 15.82 with UE support  Goal status: INITIAL  3.  Patient will increase Berg Balance score by > 6 points to demonstrate decreased fall risk during functional activities Baseline: 48 Goal status: INITIAL  4.  Patient will increase 6 min walk test by 116ft  >1.46m/s as to improve gait speed for better community ambulation and to reduce fall risk. Baseline: 950 Goal status: INITIAL  5.  Patient will reduce timed up and go to <11 seconds to reduce fall risk and demonstrate  improved transfer/gait ability. Baseline: 15.72 Goal status: INITIAL  6.  Patient will increase FGA score to >22 as to demonstrate reduced fall risk and improved dynamic gait balance for better safety with community/home ambulation.   Baseline:19 Goal status: INITIAL   ASSESSMENT:  CLINICAL IMPRESSION: Patient  seen today for physical therapy treatment for balance, strength, and endurance deficits secondary to CA treatment and prolonged immobility. PT treatment focused on improved BLE strength and endurance. Pt able to tolerance increased weight on BLE and new therapeutic exercises at increased repetitions compared to prior sessions.  Pt will benefit from skilled PT to address balance, strength and ROM deficits, improve safety and QoL while continuing CA treatment.       OBJECTIVE IMPAIRMENTS: Abnormal gait, cardiopulmonary status limiting activity, decreased activity tolerance, decreased balance, decreased endurance, decreased knowledge of condition, decreased mobility, difficulty walking, decreased ROM, decreased strength, hypomobility, increased fascial restrictions, impaired perceived functional ability, impaired flexibility, impaired UE functional use, improper body mechanics, postural dysfunction, and pain.   ACTIVITY LIMITATIONS: carrying, lifting, bending, standing, squatting, sleeping, stairs, transfers, bathing, toileting, dressing, reach over head, and locomotion level  PARTICIPATION LIMITATIONS: cleaning, laundry, driving, shopping, community activity, and yard work  PERSONAL FACTORS: Age, Behavior pattern, Fitness, Past/current experiences, and 1-2 comorbidities: CA with profound metastases   are also affecting patient's functional outcome.   REHAB POTENTIAL: Good  CLINICAL DECISION MAKING: Evolving/moderate complexity  EVALUATION COMPLEXITY: Moderate  PLAN:  PT FREQUENCY: 1-2x/week  PT DURATION: 12 weeks  PLANNED INTERVENTIONS: Therapeutic exercises, Therapeutic  activity, Neuromuscular re-education, Balance training, Gait training, Patient/Family education, Self Care, Joint mobilization, Stair training, DME instructions, Cryotherapy, Moist heat, and Manual therapy  PLAN FOR NEXT SESSION:   Continue BLE strengthening and balance training.  UE and cervical ROM as tolerated.    Golden Pop, PT 01/07/2023, 3:21 PM

## 2023-01-07 NOTE — Telephone Encounter (Signed)
Received call from pt with complaint of constipation.  Pt states she has purchased an enema to use today. RN educated pt on daily Miralax usage for preventative care.  Pt verbalized understanding.

## 2023-01-11 ENCOUNTER — Ambulatory Visit: Payer: Commercial Managed Care - PPO | Admitting: Physical Therapy

## 2023-01-12 ENCOUNTER — Ambulatory Visit: Payer: Medicaid Other | Admitting: Physical Therapy

## 2023-01-12 DIAGNOSIS — M436 Torticollis: Secondary | ICD-10-CM

## 2023-01-12 DIAGNOSIS — R262 Difficulty in walking, not elsewhere classified: Secondary | ICD-10-CM

## 2023-01-12 DIAGNOSIS — M25562 Pain in left knee: Secondary | ICD-10-CM

## 2023-01-12 DIAGNOSIS — M25612 Stiffness of left shoulder, not elsewhere classified: Secondary | ICD-10-CM

## 2023-01-12 DIAGNOSIS — M6281 Muscle weakness (generalized): Secondary | ICD-10-CM

## 2023-01-12 DIAGNOSIS — M25611 Stiffness of right shoulder, not elsewhere classified: Secondary | ICD-10-CM

## 2023-01-12 DIAGNOSIS — R29898 Other symptoms and signs involving the musculoskeletal system: Secondary | ICD-10-CM

## 2023-01-12 DIAGNOSIS — M542 Cervicalgia: Secondary | ICD-10-CM

## 2023-01-12 NOTE — Therapy (Signed)
OUTPATIENT PHYSICAL THERAPY NEURO treatment/ PHYSICAL THERAPY PROGRESS NOTE   Dates of reporting period  12/07/2022   to   01/12/2023    Patient Name: Kathryn Lucas MRN: [id number] DOB:[birth year], 62 y.o., female Today's Date: 01/12/2023   PCP: Ollen Bowl, MD  REFERRING PROVIDER: [name], MD   END OF SESSION:  PT End of Session - 01/12/23 1152     Visit Number 10    Number of Visits 24    Date for PT Re-Evaluation 03/01/23    Progress Note Due on Visit 20    PT Start Time 1155    PT Stop Time 1230    PT Time Calculation (min) 35 min    Equipment Utilized During Treatment Gait belt    Behavior During Therapy WFL for tasks assessed/performed             Past Medical History:  Diagnosis Date   Breast cancer (HCC)    left breast cancer   Cancer (HCC) 09/2017   left breast cancer   Family history of breast cancer    Hypothyroidism    Personal history of radiation therapy 2019   Thyroid disease    Past Surgical History:  Procedure Laterality Date   BREAST BIOPSY Left 2018   BREAST BIOPSY Left 2019   BREAST RECONSTRUCTION WITH PLACEMENT OF TISSUE EXPANDER AND ALLODERM Left 10/26/2017   Procedure: LEFT BREAST RECONSTRUCTION WITH PLACEMENT OF TISSUE EXPANDER AND ALLODERM;  Surgeon: Glenna Fellows, MD;  Location: [facility];  Service: Plastics;  Laterality: Left;   MASTECTOMY Left 2019   MASTECTOMY WITH RADIOACTIVE SEED GUIDED EXCISION AND AXILLARY SENTINEL LYMPH NODE BIOPSY Left 10/26/2017   Procedure: LEFT MASTECTOMY WITH SEED TARGETED  LEFT AXILLARY LYMPH NODE EXCISION AND LEFT SENTINEL LYMPH NODE BIOPSY;  Surgeon: Almond Lint, MD;  Location: [facility];  Service: General;  Laterality: Left;   TONSILLECTOMY     WISDOM TOOTH EXTRACTION     Patient Active Problem List   Diagnosis Date Noted   Metastatic 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) 10/14/2022   Elevated liver enzymes 10/14/2022   Unexplained weight loss 10/14/2022    Neoplasm related pain 10/14/2022   Metastatic malignant neoplasm (HCC) 12/19/2021   Family history of breast cancer    History of therapeutic radiation 04/27/2018   Breast cancer, left breast (HCC) 10/26/2017   Malignant neoplasm of lower-outer quadrant of left breast of female, estrogen receptor positive (HCC) 07/15/2016   Plantar fasciitis 07/30/2015   Metatarsalgia 10/18/2014    ONSET DATE: September 2023   REFERRING DIAG:  Diagnosis  C79.51 (ICD-10-CM) - Secondary malignant neoplasm of bone    THERAPY DIAG:  Muscle weakness (generalized)  Stiffness of cervical spine  Decreased range of motion of left shoulder  Difficulty in walking, not elsewhere classified  Painful cervical ROM  Leg weakness, bilateral  Decreased range of motion of right shoulder  Acute pain of left knee  Rationale for Evaluation and Treatment: Rehabilitation  SUBJECTIVE:  SUBJECTIVE STATEMENT: Pt states that she is doing well. Mild soreness in the L shoulder, from pushing up from low seat at friends house over the weekend. No other updates   Pt accompanied by:  care giver   PERTINENT HISTORY:   Pt is 62 y.o. female that is seeking therapy for weakness of the R LE. Pt tripped over the threshold of her door back in September of 2023, and had a transverse fracture of the L patella when she fell on it. She was recently diagnosed with bone cancer back in October of 2023. Pt notes that she has more stiffness in the neck and pelvis region because the cancer is located in those locations.   Pt had exacerbation of s/s in June of 2024 with hospitalization and subsequent change in oncology treatment limiting ability to attend PT. Pt has responded well to treatment and now able to attend PT.   PAIN:  Are you having pain?  Yes: NPRS scale: 2/10 Pain location: neck and shoulders  Pain description: stiffness  Aggravating factors: standing  Relieving factors: small pillows   PRECAUTIONS: Cervical and Other: cervical for comfort   RED FLAGS: None   WEIGHT BEARING RESTRICTIONS: No  FALLS: Has patient fallen in last 6 months? Yes. Number of falls 1  LIVING ENVIRONMENT: Lives with: lives alone Lives in: House/apartment Stairs:  yes, but uses ramp  Has following equipment at home: Walker - 4 wheeled  PLOF: Needs assistance with ADLs and Needs assistance with homemaking  PATIENT GOALS: improve balance, strength and endurance. Stand at kitchen longer and   OBJECTIVE:   DIAGNOSTIC FINDINGS:   EXAM: BILATERAL LOWER EXTREMITY VENOUS DOPPLER ULTRASOUND IMPRESSION: Negative.  EXAM: CT HEAD WITHOUT AND WITH CONTRAST IMPRESSION: 1. Extensive osseous metastatic disease throughout the calvarium. Extraosseous soft tissue tumor in the right orbit appears improved, while the additional areas of extraosseous tumor described in detail above are otherwise overall not significantly changed. 2. Tumor is again inseparable from the anterior aspect of the superior sagittal sinus and left sigmoid sinus, unchanged. 3. Decreased but not resolved infiltrative thickening of the left inferior and medial rectus muscles. 4. Unchanged innumerable scalp nodules, nonspecific but possibly metastatic lesions.     COGNITION: Overall cognitive status: Within functional limits for tasks assessed   SENSATION: WFL  COORDINATION: Limited due to ROM deficits. WFL    MUSCLE TONE: WFL    POSTURE: rounded shoulders, forward head, and decreased lumbar lordosis  LOWER EXTREMITY MMT:   MMT  Right Eval Left Eval  Hip flexion 4 4  Hip extension    Hip abduction 4 4  Hip adduction 4+ 4+  Hip internal rotation    Hip external rotation    Knee flexion 4 4  Knee extension 4+ 4+  Ankle dorsiflexion 5 5  Ankle  plantarflexion 4 4  Ankle inversion    Ankle eversion     (Blank rows = not tested)  LOWER EXTREMITY ROM:     To be tested BLE and BUE   BED MOBILITY:  Sit to supine SBA Supine to sit SBA  TRANSFERS: Assistive device utilized: None  Sit to stand: Modified independence Stand to sit: Modified independence Chair to chair: Modified independence Floor: Modified independence   CURB:  Level of Assistance: CGA Assistive device utilized:  Youth worker Comments: step to pattern  STAIRS: Level of Assistance: CGA Stair Negotiation Technique: Step to Pattern with Bilateral Rails Number of Stairs: 4  Height of Stairs: 6  Comments: step to  with BUE support   GAIT: Gait pattern: step through pattern and circumduction- Right Distance walked: 134ft Assistive device utilized: None Level of assistance: SBA Comments: mild R LE circumduction, but greatly improved since last bout of PT treatment.   FUNCTIONAL TESTS:  5 times sit to stand: 15.82 Timed up and go (TUG): 15.72 6 minute walk test: TBD 10 meter walk test: 10.225sec (0.69m/s)  Berg Balance Scale: 4656 Functional gait assessment: TBD.   PATIENT SURVEYS:  FOTO 63 ( goal 1)  TODAY'S TREATMENT:                                                                                                                              DATE: 01/12/2023  PT treatment focused on goal assessment for progress note; see goals below for details.   Throughout session, pt required multiple seated rest breaks due to BLE fatigue, but recoverd quickly and able to continue without issue.    PATIENT EDUCATION: Education details: Pt educated throughout session about proper posture and technique with exercises. Improved exercise technique, movement at target joints, use of target muscles after min to mod verbal, visual, tactile cues.  Person educated: Patient and Caregiver MAry Education method: Explanation Education comprehension: verbalized  understanding  HOME EXERCISE PROGRAM: Access Code: [email or web address] URL: [email or web address] Date: 12/14/2022 Prepared by: Grier Rocher  Exercises - Mini Squat with Counter Support  - 1 x daily - 7 x weekly - 3 sets - 10 reps - Standing Hip Abduction with Counter Support  - 1 x daily - 7 x weekly - 3 sets - 10 reps - Standing March with Counter Support  - 1 x daily - 7 x weekly - 3 sets - 10 reps - Seated Long Arc Quad  - 1 x daily - 7 x weekly - 3 sets - 10 reps  GOALS: Goals reviewed with patient? Yes  SHORT TERM GOALS: Target date: 01/13/2023    Patient will be independent in home exercise program to improve strength/mobility for better functional independence with ADLs. Baseline:to  given 9/30  10/29. Pt reports inconsistent HEP completion. Goal status: IN PROGRESS   LONG TERM GOALS: Target date: 03/03/2023    Patient will increase FOTO score to equal to or greater than  68   to demonstrate statistically significant improvement in mobility and quality of life.  Baseline: 63  10/29: 74 Goal status: MET  2.  Patient (> 37 years old) will complete five times sit to stand test in < 15 seconds without use of BUE indicating an increased LE strength and improved balance. Baseline: 15.82 with UE support  10/29: 13.84 with UE support push from seat  Goal status: MET; revised   3.  Patient will increase Berg Balance score by > 6 points to demonstrate decreased fall risk during functional activities Baseline: 46 10/29: 49  Goal status: IN PROGRESS  4.  Patient will increase 6 min walk test to >1262ft as to improve gait  speed for better community ambulation and to reduce fall risk. Baseline: 950 10/29 1125ft  Goal status: MET ; revised   5.  Patient will reduce timed up and go to <11 seconds to reduce fall risk and demonstrate improved transfer/gait ability. Baseline: 15.72 10/29: 9.97sec  Goal status: MET  6.  Patient will increase FGA score to >22 as to  demonstrate reduced fall risk and improved dynamic gait balance for better safety with community/home ambulation.   Baseline:19 10/29 24 Goal status: MET   ASSESSMENT:  CLINICAL IMPRESSION: Patient  seen today for progress note. Pt has met 5 of 6 LTGs demonstrating improved endurance and balance. Increased fall risk still indicated through [place] of 49, and 6 min walk test of only 1119, but improved in both from baseline. Pt able to performed 5x STS <15 sec, but continues to require slight UE support to achieve full standing.   6 min walk test and 5x STS goals adjusted as list above due to progress towards baseline functional level.  Pt will continue to benefit from skilled PT to address balance, strength and ROM deficits, improve safety and QoL while continuing CA treatment.       OBJECTIVE IMPAIRMENTS: Abnormal gait, cardiopulmonary status limiting activity, decreased activity tolerance, decreased balance, decreased endurance, decreased knowledge of condition, decreased mobility, difficulty walking, decreased ROM, decreased strength, hypomobility, increased fascial restrictions, impaired perceived functional ability, impaired flexibility, impaired UE functional use, improper body mechanics, postural dysfunction, and pain.   ACTIVITY LIMITATIONS: carrying, lifting, bending, standing, squatting, sleeping, stairs, transfers, bathing, toileting, dressing, reach over head, and locomotion level  PARTICIPATION LIMITATIONS: cleaning, laundry, driving, shopping, community activity, and yard work  PERSONAL FACTORS: Age, Behavior pattern, Fitness, Past/current experiences, and 1-2 comorbidities: CA with profound metastases   are also affecting patient's functional outcome.   REHAB POTENTIAL: Good  CLINICAL DECISION MAKING: Evolving/moderate complexity  EVALUATION COMPLEXITY: Moderate  PLAN:  PT FREQUENCY: 1-2x/week  PT DURATION: 12 weeks  PLANNED INTERVENTIONS: Therapeutic exercises,  Therapeutic activity, Neuromuscular re-education, Balance training, Gait training, Patient/Family education, Self Care, Joint mobilization, Stair training, DME instructions, Cryotherapy, Moist heat, and Manual therapy  PLAN FOR NEXT SESSION:   Continue BLE strengthening and balance training.  UE and cervical ROM as tolerated.    Golden Pop, PT 01/12/2023, 11:54 AM

## 2023-01-13 ENCOUNTER — Ambulatory Visit: Payer: Commercial Managed Care - PPO | Admitting: Physical Therapy

## 2023-01-13 ENCOUNTER — Encounter: Payer: Self-pay | Admitting: Hematology and Oncology

## 2023-01-13 MED FILL — Fosaprepitant Dimeglumine For IV Infusion 150 MG (Base Eq): INTRAVENOUS | Qty: 5 | Status: AC

## 2023-01-14 ENCOUNTER — Inpatient Hospital Stay: Payer: Medicaid Other

## 2023-01-14 ENCOUNTER — Inpatient Hospital Stay (HOSPITAL_BASED_OUTPATIENT_CLINIC_OR_DEPARTMENT_OTHER): Payer: Medicaid Other | Admitting: Hematology and Oncology

## 2023-01-14 VITALS — BP 122/69 | HR 114 | Temp 97.5°F | Resp 18 | Ht 70.0 in | Wt 141.5 lb

## 2023-01-14 VITALS — HR 100

## 2023-01-14 DIAGNOSIS — Z170 Estrogen receptor positive status [ER+]: Secondary | ICD-10-CM | POA: Diagnosis not present

## 2023-01-14 DIAGNOSIS — C50512 Malignant neoplasm of lower-outer quadrant of left female breast: Secondary | ICD-10-CM | POA: Diagnosis not present

## 2023-01-14 DIAGNOSIS — Z5112 Encounter for antineoplastic immunotherapy: Secondary | ICD-10-CM | POA: Diagnosis not present

## 2023-01-14 DIAGNOSIS — C799 Secondary malignant neoplasm of unspecified site: Secondary | ICD-10-CM | POA: Diagnosis not present

## 2023-01-14 LAB — CBC WITH DIFFERENTIAL (CANCER CENTER ONLY)
Abs Immature Granulocytes: 0.03 10*3/uL (ref 0.00–0.07)
Basophils Absolute: 0 10*3/uL (ref 0.0–0.1)
Basophils Relative: 1 %
Eosinophils Absolute: 0.1 10*3/uL (ref 0.0–0.5)
Eosinophils Relative: 4 %
HCT: 35.7 % — ABNORMAL LOW (ref 36.0–46.0)
Hemoglobin: 11.5 g/dL — ABNORMAL LOW (ref 12.0–15.0)
Immature Granulocytes: 1 %
Lymphocytes Relative: 16 %
Lymphs Abs: 0.5 10*3/uL — ABNORMAL LOW (ref 0.7–4.0)
MCH: 35.4 pg — ABNORMAL HIGH (ref 26.0–34.0)
MCHC: 32.2 g/dL (ref 30.0–36.0)
MCV: 109.8 fL — ABNORMAL HIGH (ref 80.0–100.0)
Monocytes Absolute: 0.4 10*3/uL (ref 0.1–1.0)
Monocytes Relative: 14 %
Neutro Abs: 1.8 10*3/uL (ref 1.7–7.7)
Neutrophils Relative %: 64 %
Platelet Count: 295 10*3/uL (ref 150–400)
RBC: 3.25 MIL/uL — ABNORMAL LOW (ref 3.87–5.11)
RDW: 16.9 % — ABNORMAL HIGH (ref 11.5–15.5)
WBC Count: 2.8 10*3/uL — ABNORMAL LOW (ref 4.0–10.5)
nRBC: 0 % (ref 0.0–0.2)

## 2023-01-14 LAB — CMP (CANCER CENTER ONLY)
ALT: 8 U/L (ref 0–44)
AST: 19 U/L (ref 15–41)
Albumin: 3.6 g/dL (ref 3.5–5.0)
Alkaline Phosphatase: 127 U/L — ABNORMAL HIGH (ref 38–126)
Anion gap: 5 (ref 5–15)
BUN: 8 mg/dL (ref 8–23)
CO2: 26 mmol/L (ref 22–32)
Calcium: 9 mg/dL (ref 8.9–10.3)
Chloride: 106 mmol/L (ref 98–111)
Creatinine: 0.51 mg/dL (ref 0.44–1.00)
GFR, Estimated: >60 mL/min (ref >60)
Glucose, Bld: 103 mg/dL — ABNORMAL HIGH (ref 70–99)
Potassium: 4.2 mmol/L (ref 3.5–5.1)
Sodium: 137 mmol/L (ref 135–145)
Total Bilirubin: 0.3 mg/dL (ref 0.3–1.2)
Total Protein: 6.4 g/dL — ABNORMAL LOW (ref 6.5–8.1)

## 2023-01-14 MED ORDER — OMEPRAZOLE 20 MG PO CPDR: 20.0000 mg | DELAYED_RELEASE_CAPSULE | Freq: Every day | ORAL | 3 refills | Status: DC

## 2023-01-14 MED ORDER — PALONOSETRON HCL INJECTION 0.25 MG/5ML
0.2500 mg | Freq: Once | INTRAVENOUS | Status: AC
  Administered 2023-01-14: 0.25 mg via INTRAVENOUS
  Filled 2023-01-14: qty 5

## 2023-01-14 MED ORDER — ACETAMINOPHEN 325 MG PO TABS
650.0000 mg | ORAL_TABLET | Freq: Once | ORAL | Status: AC
  Administered 2023-01-14: 650 mg via ORAL
  Filled 2023-01-14: qty 2

## 2023-01-14 MED ORDER — FAM-TRASTUZUMAB DERUXTECAN-NXKI CHEMO 100 MG IV SOLR
3.2000 mg/kg | Freq: Once | INTRAVENOUS | Status: AC
  Administered 2023-01-14: 200 mg via INTRAVENOUS
  Filled 2023-01-14: qty 10

## 2023-01-14 MED ORDER — DEXAMETHASONE SODIUM PHOSPHATE 10 MG/ML IJ SOLN
10.0000 mg | Freq: Once | INTRAMUSCULAR | Status: AC
  Administered 2023-01-14: 10 mg via INTRAVENOUS
  Filled 2023-01-14: qty 1

## 2023-01-14 MED ORDER — SODIUM CHLORIDE 0.9 % IV SOLN
150.0000 mg | Freq: Once | INTRAVENOUS | Status: AC
  Administered 2023-01-14: 150 mg via INTRAVENOUS
  Filled 2023-01-14: qty 150

## 2023-01-14 MED ORDER — DIPHENHYDRAMINE HCL 25 MG PO CAPS
50.0000 mg | ORAL_CAPSULE | Freq: Once | ORAL | Status: AC
  Administered 2023-01-14: 50 mg via ORAL
  Filled 2023-01-14: qty 2

## 2023-01-14 MED ORDER — DEXTROSE 5 % IV SOLN: Freq: Once | INTRAVENOUS | Status: AC

## 2023-01-14 NOTE — Progress Notes (Signed)
Patient Care Team: Ollen Bowl, MD as PCP - General (Internal Medicine) [name], MD as Consulting Physician (Hematology and Oncology) Almond Lint, MD as Consulting Physician (General Surgery) Dorothy Puffer, MD as Consulting Physician (Radiation Oncology) Carmina Miller, MD as Consulting Physician (Radiation Oncology)  DIAGNOSIS:  Encounter Diagnoses  Name Primary?   Malignant neoplasm of lower-outer quadrant of left breast of female, estrogen receptor positive (HCC) Yes   Metastatic malignant neoplasm, unspecified site (HCC)     SUMMARY OF ONCOLOGIC HISTORY: Oncology History  Malignant neoplasm of lower-outer quadrant of left breast of female, estrogen receptor positive (HCC)  06/11/2016 Mammogram   Palpable left breast masses 3:00 position: 2.2 cm; 5:30 position: 2.5 cm; 6:30 position: 0.7 cm   06/19/2016 Initial Diagnosis   Left breast biopsy 3:30: IDC with DCIS grade 1, ER 90%, PR 50%, Ki-67 15%, HER-2 negative ratio 1.13; biopsy 5:30 position: IDC grade 1   07/13/2016 Breast MRI   Large area of abnormal enhancement lower inner and lower outer quadrants left breast spanning 9 cm x 6.4 cm x 5.3 cm, no abnormal enlarged lymph nodes; T3 N0 stage II a (New AJCC staging)    07/15/2016 - 12/08/2017 Anti-estrogen oral therapy   Neoadjuvant anastrozole 1 mg daily   07/17/2016 Oncotype testing   Testing done on the biopsy: Oncotype DX score 22, intermediate risk   02/02/2017 Breast MRI   Left breast multicentric disease unchanged measuring 2.7 x 1.6 cm.  Mass in the non-mass enhancement are also not significantly changed measuring 6.2 x 2.4 cm. new enhancing mass within the outer right breast 7 mm which could be fat necrosis or inclusion cyst    02/09/2017 Imaging   Ultrasound of the right breast lesion noted on MRI: No sonographic finding corresponds to the abnormality noted on MRI   07/13/2017 Cancer Staging   Staging form: Breast, AJCC 8th Edition - Clinical stage from  07/13/2017: Stage IIA (cT3, cN0, cM0, G1, ER+, PR+, HER2-) - Signed by Loa Socks, NP on 05/18/2018   10/26/2017 Surgery   Left mastectomy: IDC grade 1, 2 foci largest spans 8.5 cm, intermediate grade DCIS, lymphovascular invasion identified, perineural invasion identified, 1/2 lymph nodes positive with extracapsular extension, ER 9200%, PR 5 to 50%, HER-2 negative, Ki-67 10 to 15%, T3N1A Mammaprint: low risk   11/02/2017 Cancer Staging   Staging form: Breast, AJCC 8th Edition - Pathologic: No Stage Recommended (ypT3, pN1a, cM0, G1, ER+, PR+, HER2-) - Signed by [name], MD on 11/02/2017   12/08/2017 - 01/26/2018 Radiation Therapy   Adjuvant radiation therapy    02/2018 -  Anti-estrogen oral therapy   Anastrozole 1 mg daily adjuvant therapy   10/21/2022 -  Chemotherapy   Patient is on Treatment Plan : BREAST METASTATIC Fam-Trastuzumab Deruxtecan-nxki (Enhertu) (5.4) q21d       CHIEF COMPLIANT: Cycle 5 Enhertu    History of Present Illness   The patient, who has been undergoing Enhertu for Metastatic breast cancer, presents today in good spirits for cycle 5 of treatment.   She also reports a change in her voice, which she describes as being quieter and more difficult to hear. She has been patient with this symptom, but expresses some frustration with the impact it has on her daily life.  In addition to these symptoms, the patient has been dealing with gastrointestinal issues. She has been taking over-the-counter medication, which she reports has been effective in managing her symptoms. She previously tried a prescription medication, but found  it to be ineffective.  The patient is also nearing the end of her physical therapy sessions. She reports that her therapist has been pushing her and that she has seen significant improvement as a result. She is looking forward to transitioning to a regular fitness routine at a local gym.        ALLERGIES:  is allergic to percocet  [oxycodone-acetaminophen].  MEDICATIONS:  Current Outpatient Medications  Medication Sig Dispense Refill   omeprazole (PRILOSEC) 20 MG capsule Take 1 capsule (20 mg total) by mouth daily. 30 capsule 3   azelastine (ASTELIN) 0.1 % nasal spray Place into both nostrils 2 (two) times daily. (Patient not taking: Reported on 11/03/2022)     Calcium 500-100 MG-UNIT CHEW Chew 1 tablet by mouth daily. 60 tablet    cetirizine (ZYRTEC) 10 MG tablet Take by mouth. (Patient not taking: Reported on 11/03/2022)     cholecalciferol (VITAMIN D3) 25 MCG (1000 UNIT) tablet Take 1 tablet (1,000 Units total) by mouth daily. (Patient not taking: Reported on 11/03/2022)     dexamethasone (DECADRON) 4 MG tablet Take 1 tab for 2 days after chemo (Patient not taking: Reported on 12/28/2022) 30 tablet 1   furosemide (LASIX) 20 MG tablet Take 1 tablet (20 mg total) by mouth daily. If needed may take 1/2 tab q am 30 tablet 0   levothyroxine (SYNTHROID) 112 MCG tablet Take 1 tablet by mouth daily.     LORazepam (ATIVAN) 0.5 MG tablet Take 1 tablet (0.5 mg total) by mouth at bedtime. (Patient not taking: Reported on 11/03/2022) 30 tablet 0   ondansetron (ZOFRAN) 8 MG tablet Take 1 tablet (8 mg total) by mouth every 8 (eight) hours as needed for nausea or vomiting. Start on the third day after chemotherapy. (Patient not taking: Reported on 11/03/2022) 30 tablet 1   pantoprazole (PROTONIX) 40 MG tablet 1 tablet Orally Once a day for 30 days Take 1 tablet twice daily for 2 weeks then take 1 tablet daily. (Patient not taking: Reported on 12/28/2022)     prochlorperazine (COMPAZINE) 10 MG tablet Take 1 tablet (10 mg total) by mouth every 6 (six) hours as needed for nausea or vomiting. (Patient not taking: Reported on 11/03/2022) 30 tablet 1   sucralfate (CARAFATE) 1 g tablet TAKE 1 TABLET BY MOUTH 4 TIMES DAILY WITH MEALS AND AT BEDTIME. DISSOLVE IN WARM WATER, SWISH AND SWALLOW. 120 tablet 1   vitamin C (ASCORBIC ACID) 250 MG tablet Take  1 tablet (250 mg total) by mouth daily.     No current facility-administered medications for this visit.    PHYSICAL EXAMINATION: ECOG PERFORMANCE STATUS: 1 - Symptomatic but completely ambulatory  Vitals:   01/14/23 1354  BP: 122/69  Pulse: (!) 114  Resp: 18  Temp: (!) 97.5 F (36.4 C)  SpO2: 100%   Filed Weights   01/14/23 1354  Weight: 141 lb 8 oz (64.2 kg)      LABORATORY DATA:  I have reviewed the data as listed    Latest Ref Rng & Units 01/14/2023    1:21 PM 12/24/2022    2:46 PM 12/01/2022    2:32 PM  CMP  Glucose 70 - 99 mg/dL 782  85  80   BUN 8 - 23 mg/dL 8  5  10    Creatinine 0.44 - 1.00 mg/dL 9.56  2.13  0.86   Sodium 135 - 145 mmol/L 137  137  137   Potassium 3.5 - 5.1 mmol/L 4.2  3.7  3.9   Chloride 98 - 111 mmol/L 106  105  106   CO2 22 - 32 mmol/L 26  26  27    Calcium 8.9 - 10.3 mg/dL 9.0  9.3  8.7   Total Protein 6.5 - 8.1 g/dL 6.4  6.4  6.1   Total Bilirubin 0.3 - 1.2 mg/dL 0.3  0.3  0.3   Alkaline Phos 38 - 126 U/L 127  133  86   AST 15 - 41 U/L 19  22  26    ALT 0 - 44 U/L 8  10  13      Lab Results  Component Value Date   WBC 2.8 (L) 01/14/2023   HGB 11.5 (L) 01/14/2023   HCT 35.7 (L) 01/14/2023   MCV 109.8 (H) 01/14/2023   PLT 295 01/14/2023   NEUTROABS 1.8 01/14/2023    ASSESSMENT & PLAN:  Malignant neoplasm of lower-outer quadrant of left breast of female, estrogen receptor positive (HCC) 10/26/17: Left mastectomy: IDC grade 1, 2 foci largest spans 8.5 cm, intermediate grade DCIS, lymphovascular invasion identified, perineural invasion identified, 1/2 lymph nodes positive with extracapsular extension, ER 9200%, PR 5 to 50%, HER-2 negative, Ki-67 10 to 15%, T3N1A   Oncotype DX score 22, intermediate risk, chemotherapy not felt to have significant benefit.   Treatment Summary: 1. Antiestrogen therapy with anastrozole 1 mg daily started 07/15/2016 2. Mastectomy 10/26/2017, Mammaprint low risk luminal type A 3. Followed by adjuvant  radiation 12/08/17- 01/26/18  4. Followed by adjuvant antiestrogen therapy anastrozole started 01/17/2018 (originally started 07/15/2016) -------------------------------------------------------------------- Low back pain August 2023: Underwent CT myelogram: Large expansile lesion in the sacrum with extraosseous extension of the tumor, diffuse lytic lesions throughout the visualized spine with metastatic disease myeloma is considered less likely.  (This was ordered by Dr. Marcene Corning)   Treatment plan: 1.  PET CT scan 12/27/2021: Widespread bone metastatic disease largest lesion involving the sacrum with pathological fractures of T6 and T10 retroperitoneal and pelvic lymph node metastasis, right axillary lymph node, activity in the pancreatic head, hypermetabolic activity in the adrenal glands, right thyroid nodule. 2. biopsy of sacrum: 12/26/2021: Metastatic breast cancer, ER 90%, PR 10%, HER2 negative (0) 3.  Ibrance along with Faslodex started 12/25/2021-10/14/2022 4.  Xgeva for bone metastases.  Every 3 months 5.  Palliative radiation to the sacrum completed 01/19/2022 ---------------------------------------------------------------------------------------------------------------------- Current treatment: Enhertu started 10/21/2022, today cycle 5 We plan to continue this treatment irrespective of her blood counts.   Enhertu toxicities: Tolerated extremely well without any problems. Completed radiation   Liver function tests have started to improve.   PET/CT 10/29/2022: Overall improved appearance of bone lesions.  Stable right extra lymph node, retroperitoneal lymph nodes decreased activity CT head ordered by Dr. 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  Unchanged innumerable scalp nodules   Return to clinic every 3 weeks for Enhertu  treatment. We will perform 6 cycles of Enhertu and then obtain CT CAP. I discussed with her that she would likely continue Enhertu as a maintenance as long as she is responding. ------------------------------------- Assessment and Plan     Gastroesophageal Reflux Disease (GERD) Patient reports that over-the-counter (OTC) medication (Iberogast) is more effective than previous prescription (Protonix 40mg ). -Attempt to prescribe a similar medication to the OTC one that the patient finds effective.  Dysphonia Patient reports persistent voice changes. Possible nerve compression affecting vocal  cords. -Continue monitoring. No specific intervention planned at this time.  Esophageal Stricture Patient reports feeling of tightness, possibly indicating a need for esophageal dilation. -Refer to gastroenterologist for possible esophageal dilation.  Physical Therapy Patient reports significant improvement and is nearing the end of physical therapy. -Continue physical therapy as planned. Transition to independent exercise at a fitness center after completion of physical therapy.          Orders Placed This Encounter  Procedures   CT CHEST ABDOMEN PELVIS W CONTRAST    Standing Status:   Future    Standing Expiration Date:   01/14/2024    Order Specific Question:   If indicated for the ordered procedure, I authorize the administration of contrast media per Radiology protocol    Answer:   Yes    Order Specific Question:   Does the patient have a contrast media/X-ray dye allergy?    Answer:   No    Order Specific Question:   Preferred imaging location?    Answer:   [facility]    Order Specific Question:   Release to patient    Answer:   Immediate    Order Specific Question:   If indicated for the ordered procedure, I authorize the administration of oral contrast media per Radiology protocol    Answer:   Yes   The patient has a good understanding of the overall plan. she agrees with  it. she will call with any problems that may develop before the next visit here. Total time spent: 30 mins including face to face time and time spent for planning, charting and co-ordination of care   Tamsen Meek, MD 01/14/23

## 2023-01-14 NOTE — Assessment & Plan Note (Addendum)
10/26/17: Left mastectomy: IDC grade 1, 2 foci largest spans 8.5 cm, intermediate grade DCIS, lymphovascular invasion identified, perineural invasion identified, 1/2 lymph nodes positive with extracapsular extension, ER 9200%, PR 5 to 50%, HER-2 negative, Ki-67 10 to 15%, T3N1A   Oncotype DX score 22, intermediate risk, chemotherapy not felt to have significant benefit.   Treatment Summary: 1. Antiestrogen therapy with anastrozole 1 mg daily started 07/15/2016 2. Mastectomy 10/26/2017, Mammaprint low risk luminal type A 3. Followed by adjuvant radiation 12/08/17- 01/26/18  4. Followed by adjuvant antiestrogen therapy anastrozole started 01/17/2018 (originally started 07/15/2016) -------------------------------------------------------------------- Low back pain August 2023: Underwent CT myelogram: Large expansile lesion in the sacrum with extraosseous extension of the tumor, diffuse lytic lesions throughout the visualized spine with metastatic disease myeloma is considered less likely.  (This was ordered by Dr. Marcene Corning)   Treatment plan: 1.  PET CT scan 12/27/2021: Widespread bone metastatic disease largest lesion involving the sacrum with pathological fractures of T6 and T10 retroperitoneal and pelvic lymph node metastasis, right axillary lymph node, activity in the pancreatic head, hypermetabolic activity in the adrenal glands, right thyroid nodule. 2. biopsy of sacrum: 12/26/2021: Metastatic breast cancer, ER 90%, PR 10%, HER2 negative (0) 3.  Ibrance along with Faslodex started 12/25/2021-10/14/2022 4.  Xgeva for bone metastases.  Every 3 months 5.  Palliative radiation to the sacrum completed 01/19/2022 ---------------------------------------------------------------------------------------------------------------------- Current treatment: Enhertu started 10/21/2022, today cycle 5 We plan to continue this treatment irrespective of her blood counts.   Enhertu toxicities: Tolerated extremely  well without any problems. Completed radiation   Liver function tests have started to improve.   PET/CT 10/29/2022: Overall improved appearance of bone lesions.  Stable right extra lymph node, retroperitoneal lymph nodes decreased activity CT head ordered by Dr. 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  Unchanged innumerable scalp nodules   Return to clinic every 3 weeks for Enhertu treatment. We will perform 6 cycles of Enhertu and then obtain CT CAP. I discussed with her that she would likely continue Enhertu as a maintenance as long as she is responding.

## 2023-01-18 ENCOUNTER — Ambulatory Visit: Payer: Medicaid Other | Attending: Hematology and Oncology | Admitting: Physical Therapy

## 2023-01-18 ENCOUNTER — Ambulatory Visit: Payer: Commercial Managed Care - PPO | Admitting: Physical Therapy

## 2023-01-18 DIAGNOSIS — R29898 Other symptoms and signs involving the musculoskeletal system: Secondary | ICD-10-CM | POA: Diagnosis present

## 2023-01-18 DIAGNOSIS — M25612 Stiffness of left shoulder, not elsewhere classified: Secondary | ICD-10-CM | POA: Insufficient documentation

## 2023-01-18 DIAGNOSIS — M436 Torticollis: Secondary | ICD-10-CM | POA: Insufficient documentation

## 2023-01-18 DIAGNOSIS — R262 Difficulty in walking, not elsewhere classified: Secondary | ICD-10-CM | POA: Insufficient documentation

## 2023-01-18 DIAGNOSIS — M25562 Pain in left knee: Secondary | ICD-10-CM | POA: Diagnosis present

## 2023-01-18 DIAGNOSIS — M25611 Stiffness of right shoulder, not elsewhere classified: Secondary | ICD-10-CM | POA: Insufficient documentation

## 2023-01-18 DIAGNOSIS — M542 Cervicalgia: Secondary | ICD-10-CM | POA: Diagnosis present

## 2023-01-18 DIAGNOSIS — M6281 Muscle weakness (generalized): Secondary | ICD-10-CM | POA: Diagnosis present

## 2023-01-18 NOTE — Therapy (Unsigned)
OUTPATIENT PHYSICAL THERAPY NEURO treatment/ PHYSICAL THERAPY PROGRESS NOTE   Dates of reporting period  12/07/2022   to   01/12/2023    Patient Name: Kathryn Lucas MRN: [id number] DOB:12/10/1960, 62 y.o., female Today's Date: 01/18/2023   PCP: Kathryn Bowl, MD  REFERRING PROVIDER: [name], MD   END OF SESSION:  PT End of Session - 01/18/23 1618     Visit Number 11    Number of Visits 24    Date for PT Re-Evaluation 03/01/23    Progress Note Due on Visit 20    PT Start Time 1620    PT Stop Time 1700    PT Time Calculation (min) 40 min    Equipment Utilized During Treatment Gait belt    Behavior During Therapy [facility] for tasks assessed/performed             Past Medical History:  Diagnosis Date   Breast cancer (HCC)    left breast cancer   Cancer (HCC) 09/2017   left breast cancer   Family history of breast cancer    Hypothyroidism    Personal history of radiation therapy 2019   Thyroid disease    Past Surgical History:  Procedure Laterality Date   BREAST BIOPSY Left 2018   BREAST BIOPSY Left 2019   BREAST RECONSTRUCTION WITH PLACEMENT OF TISSUE EXPANDER AND ALLODERM Left 10/26/2017   Procedure: LEFT BREAST RECONSTRUCTION WITH PLACEMENT OF TISSUE EXPANDER AND ALLODERM;  Surgeon: Glenna Fellows, MD;  Location: [facility];  Service: Plastics;  Laterality: Left;   MASTECTOMY Left 2019   MASTECTOMY WITH RADIOACTIVE SEED GUIDED EXCISION AND AXILLARY SENTINEL LYMPH NODE BIOPSY Left 10/26/2017   Procedure: LEFT MASTECTOMY WITH SEED TARGETED  LEFT AXILLARY LYMPH NODE EXCISION AND LEFT SENTINEL LYMPH NODE BIOPSY;  Surgeon: Almond Lint, MD;  Location: [facility];  Service: General;  Laterality: Left;   TONSILLECTOMY     WISDOM TOOTH EXTRACTION     Patient Active Problem List   Diagnosis Date Noted   Metastatic 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) 10/14/2022   Elevated liver enzymes 10/14/2022   Unexplained weight loss 10/14/2022    Neoplasm related pain 10/14/2022   Metastatic malignant neoplasm (HCC) 12/19/2021   Family history of breast cancer    History of therapeutic radiation 04/27/2018   Breast cancer, left breast (HCC) 10/26/2017   Malignant neoplasm of lower-outer quadrant of left breast of female, estrogen receptor positive (HCC) 07/15/2016   Plantar fasciitis 07/30/2015   Metatarsalgia 10/18/2014    ONSET DATE: September 2023   REFERRING DIAG:  Diagnosis  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 Still feeling very weak. No other complaints.   Pt accompanied by:  none.   PERTINENT HISTORY:   Pt is 62 y.o. female that is seeking therapy for weakness of the R LE. Pt tripped over the threshold of her door back in September of 2023, and had a transverse fracture of the L patella when she fell on it. She was recently diagnosed with bone cancer back in October of 2023. Pt notes that she has more stiffness in the neck and pelvis region because the cancer is located in those locations.   Pt had exacerbation of s/s in June of 2024 with hospitalization and subsequent change in oncology treatment limiting ability to attend PT. Pt has responded well to treatment and now able to attend PT.   PAIN:  Are you having pain? Yes: NPRS scale: 2/10 Pain location: neck and shoulders  Pain description:  stiffness  Aggravating factors: standing  Relieving factors: small pillows   PRECAUTIONS: Cervical and Other: cervical for comfort   RED FLAGS: None   WEIGHT BEARING RESTRICTIONS: No  FALLS: Has patient fallen in last 6 months? Yes. Number of falls 1  LIVING ENVIRONMENT: Lives with: lives alone Lives in: House/apartment Stairs:  yes, but uses ramp  Has following equipment at home: Walker - 4 wheeled  PLOF: Needs assistance with ADLs and Needs assistance with homemaking  PATIENT GOALS: improve balance, strength and endurance. Stand at kitchen longer and   OBJECTIVE:   DIAGNOSTIC FINDINGS:   EXAM: BILATERAL LOWER EXTREMITY VENOUS DOPPLER ULTRASOUND IMPRESSION: Negative.  EXAM: CT HEAD WITHOUT AND WITH CONTRAST IMPRESSION: 1. Extensive osseous metastatic disease throughout the calvarium. Extraosseous soft tissue tumor in the right orbit appears improved, while the additional areas of extraosseous tumor described in detail above are otherwise overall not significantly changed. 2. Tumor is again inseparable from the anterior aspect of the superior sagittal sinus and left sigmoid sinus, unchanged. 3. Decreased but not resolved infiltrative thickening of the left inferior and medial rectus muscles. 4. Unchanged innumerable scalp nodules, nonspecific but possibly metastatic lesions.     COGNITION: Overall cognitive status: Within functional limits for tasks assessed   SENSATION: WFL  COORDINATION: Limited due to ROM deficits. WFL    MUSCLE TONE: WFL    POSTURE: rounded shoulders, forward head, and decreased lumbar lordosis  LOWER EXTREMITY MMT:   MMT  Right Eval Left Eval  Hip flexion 4 4  Hip extension    Hip abduction 4 4  Hip adduction 4+ 4+  Hip internal rotation    Hip external rotation    Knee flexion 4 4  Knee extension 4+ 4+  Ankle dorsiflexion 5 5  Ankle plantarflexion 4 4  Ankle inversion    Ankle eversion     (Blank rows = not  tested)  LOWER EXTREMITY ROM:     To be tested BLE and BUE   BED MOBILITY:  Sit to supine SBA Supine to sit SBA  TRANSFERS: Assistive device utilized: None  Sit to stand: Modified independence Stand to sit: Modified independence Chair to chair: Modified independence Floor: Modified independence   CURB:  Level of Assistance: CGA Assistive device utilized:  Youth worker Comments: step to pattern  STAIRS: Level of Assistance: CGA Stair Negotiation Technique: Step to Pattern with Bilateral Rails Number of Stairs: 4  Height of Stairs: 6  Comments: step to with BUE support   GAIT: Gait pattern: step through pattern and circumduction- Right Distance walked: 154ft  Assistive device utilized: None Level of assistance: SBA Comments: mild R LE circumduction, but greatly improved since last bout of PT treatment.   FUNCTIONAL TESTS:  5 times sit to stand: 15.82 Timed up and go (TUG): 15.72 6 minute walk test: TBD 10 meter walk test: 10.225sec (0.110m/s)  Berg Balance Scale: 4656 Functional gait assessment: TBD.   PATIENT SURVEYS:  FOTO 63 ( goal 68)  TODAY'S TREATMENT:                                                                                                                              DATE: 01/18/2023  UBE, 2.5 min forward/ 2.5 min reverse 1 min rest break between bouts.   STM to Cspine on the L side UT x 5 min   Lateral step up 6 inch with BUE support   Gait without resistance.   Required multiple rest breaks on this day.    PATIENT EDUCATION: Education details: Pt educated throughout session about proper posture and technique with exercises. Improved exercise technique, movement at target joints, use of target muscles after min to mod verbal, visual, tactile cues.  Person educated: Patient and Caregiver MAry Education method: Explanation Education comprehension: verbalized understanding  HOME EXERCISE PROGRAM: Access Code: [email or web address] URL:  [email or web address] Date: 12/14/2022 Prepared by: Grier Rocher  Exercises - Mini Squat with Counter Support  - 1 x daily - 7 x weekly - 3 sets - 10 reps - Standing Hip Abduction with Counter Support  - 1 x daily - 7 x weekly - 3 sets - 10 reps - Standing March with Counter Support  - 1 x daily - 7 x weekly - 3 sets - 10 reps - Seated Long Arc Quad  - 1 x daily - 7 x weekly - 3 sets - 10 reps  GOALS: Goals reviewed with patient? Yes  SHORT TERM GOALS: Target date: 01/13/2023    Patient will be independent in home exercise program to improve strength/mobility for better functional independence with ADLs. Baseline:to  given 9/30  10/29. Pt reports inconsistent HEP completion. Goal status: IN PROGRESS   LONG TERM GOALS: Target date: 03/03/2023    Patient will increase FOTO score to equal to or greater than  68   to demonstrate statistically significant improvement in mobility and quality of life.  Baseline: 63  10/29: 74 Goal status: MET  2.  Patient (> 55 years old) will complete five times sit to stand test in < 15 seconds without use of BUE indicating an increased LE strength and improved balance. Baseline: 15.82 with UE support  10/29: 13.84 with UE support push from seat  Goal status: MET; revised   3.  Patient will increase Berg Balance score by > 6 points to demonstrate decreased fall risk during functional activities Baseline: 46 10/29: 49  Goal status: IN PROGRESS  4.  Patient will increase 6 min walk test to >1228ft as to improve gait speed for better community  ambulation and to reduce fall risk. Baseline: 950 10/29 1161ft  Goal status: MET ; revised   5.  Patient will reduce timed up and go to <11 seconds to reduce fall risk and demonstrate improved transfer/gait ability. Baseline: 15.72 10/29: 9.97sec  Goal status: MET  6.  Patient will increase FGA score to >22 as to demonstrate reduced fall risk and improved dynamic gait balance for  better safety with community/home ambulation.   Baseline:19 10/29 24 Goal status: MET   ASSESSMENT:  CLINICAL IMPRESSION: Patient  seen today for progress note. Pt will continue to benefit from skilled PT to address balance, strength and ROM deficits, improve safety and QoL while continuing CA treatment.       OBJECTIVE IMPAIRMENTS: Abnormal gait, cardiopulmonary status limiting activity, decreased activity tolerance, decreased balance, decreased endurance, decreased knowledge of condition, decreased mobility, difficulty walking, decreased ROM, decreased strength, hypomobility, increased fascial restrictions, impaired perceived functional ability, impaired flexibility, impaired UE functional use, improper body mechanics, postural dysfunction, and pain.   ACTIVITY LIMITATIONS: carrying, lifting, bending, standing, squatting, sleeping, stairs, transfers, bathing, toileting, dressing, reach over head, and locomotion level  PARTICIPATION LIMITATIONS: cleaning, laundry, driving, shopping, community activity, and yard work  PERSONAL FACTORS: Age, Behavior pattern, Fitness, Past/current experiences, and 1-2 comorbidities: CA with profound metastases   are also affecting patient's functional outcome.   REHAB POTENTIAL: Good  CLINICAL DECISION MAKING: Evolving/moderate complexity  EVALUATION COMPLEXITY: Moderate  PLAN:  PT FREQUENCY: 1-2x/week  PT DURATION: 12 weeks  PLANNED INTERVENTIONS: Therapeutic exercises, Therapeutic activity, Neuromuscular re-education, Balance training, Gait training, Patient/Family education, Self Care, Joint mobilization, Stair training, DME instructions, Cryotherapy, Moist heat, and Manual therapy  PLAN FOR NEXT SESSION:   Continue BLE strengthening and balance training.  UE and cervical ROM as tolerated.    Golden Pop, PT 01/18/2023, 4:18 PM

## 2023-01-20 ENCOUNTER — Ambulatory Visit: Payer: Commercial Managed Care - PPO | Admitting: Physical Therapy

## 2023-01-20 ENCOUNTER — Other Ambulatory Visit: Payer: Self-pay | Admitting: *Deleted

## 2023-01-20 ENCOUNTER — Ambulatory Visit: Payer: Medicaid Other | Admitting: Physical Therapy

## 2023-01-20 DIAGNOSIS — R262 Difficulty in walking, not elsewhere classified: Secondary | ICD-10-CM

## 2023-01-20 DIAGNOSIS — M436 Torticollis: Secondary | ICD-10-CM

## 2023-01-20 DIAGNOSIS — M25612 Stiffness of left shoulder, not elsewhere classified: Secondary | ICD-10-CM

## 2023-01-20 DIAGNOSIS — R29898 Other symptoms and signs involving the musculoskeletal system: Secondary | ICD-10-CM

## 2023-01-20 DIAGNOSIS — M6281 Muscle weakness (generalized): Secondary | ICD-10-CM | POA: Diagnosis not present

## 2023-01-20 DIAGNOSIS — K222 Esophageal obstruction: Secondary | ICD-10-CM

## 2023-01-20 DIAGNOSIS — M25611 Stiffness of right shoulder, not elsewhere classified: Secondary | ICD-10-CM

## 2023-01-20 DIAGNOSIS — M25562 Pain in left knee: Secondary | ICD-10-CM

## 2023-01-20 DIAGNOSIS — M542 Cervicalgia: Secondary | ICD-10-CM

## 2023-01-20 NOTE — Therapy (Signed)
OUTPATIENT PHYSICAL THERAPY    Patient Name: Kathryn Lucas MRN: [id number] DOB:1960/08/26, 62 y.o., female Today's Date: 01/20/2023   PCP: Ollen Bowl, MD  REFERRING PROVIDER: [name], MD   END OF SESSION:  PT End of Session - 01/20/23 1330     Visit Number 12    Number of Visits 24    Date for PT Re-Evaluation 03/01/23    Progress Note Due on Visit 20    PT Start Time 1330    PT Stop Time 1400    PT Time Calculation (min) 30 min    Equipment Utilized During Treatment Gait belt    Behavior During Therapy WFL for tasks assessed/performed             Past Medical History:  Diagnosis Date   Breast cancer (HCC)    left breast cancer   Cancer (HCC) 09/2017   left breast cancer   Family history of breast cancer    Hypothyroidism    Personal history of radiation therapy 2019   Thyroid disease    Past Surgical History:  Procedure Laterality Date   BREAST BIOPSY Left 2018   BREAST BIOPSY Left 2019   BREAST RECONSTRUCTION WITH PLACEMENT OF TISSUE EXPANDER AND ALLODERM Left 10/26/2017   Procedure: LEFT BREAST RECONSTRUCTION WITH PLACEMENT OF TISSUE EXPANDER AND ALLODERM;  Surgeon: Glenna Fellows, MD;  Location: [facility];  Service: Plastics;  Laterality: Left;   MASTECTOMY Left 2019   MASTECTOMY WITH RADIOACTIVE SEED GUIDED EXCISION AND AXILLARY SENTINEL LYMPH NODE BIOPSY Left 10/26/2017   Procedure: LEFT MASTECTOMY WITH SEED TARGETED  LEFT AXILLARY LYMPH NODE EXCISION AND LEFT SENTINEL LYMPH NODE BIOPSY;  Surgeon: Almond Lint, MD;  Location: [facility];  Service: General;  Laterality: Left;   TONSILLECTOMY     WISDOM TOOTH EXTRACTION     Patient Active Problem List   Diagnosis Date Noted   Metastatic 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) 10/14/2022   Elevated liver enzymes 10/14/2022   Unexplained weight loss 10/14/2022   Neoplasm related pain 10/14/2022   Metastatic malignant neoplasm (HCC) 12/19/2021   Family history of breast cancer     History of therapeutic radiation 04/27/2018   Breast cancer, left breast (HCC) 10/26/2017   Malignant neoplasm of lower-outer quadrant of left breast of female, estrogen receptor positive (HCC) 07/15/2016   Plantar fasciitis 07/30/2015   Metatarsalgia 10/18/2014    ONSET DATE: September 2023   REFERRING DIAG:  Diagnosis  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 Still feeling very weak. No other complaints.   Pt accompanied by:  none.   PERTINENT HISTORY:   Pt is 62 y.o. female that is seeking therapy for weakness of the R LE. Pt tripped over the threshold of her door back in September of 2023, and had a transverse fracture of the L patella when she fell on it. She was recently diagnosed with bone cancer back in October of 2023. Pt notes that she has more stiffness in the neck and pelvis region because the cancer is located in those locations.   Pt had exacerbation of s/s in June of 2024 with hospitalization and subsequent change in oncology treatment limiting ability to attend PT. Pt has responded well to treatment and now able to attend PT.   PAIN:  Are you having pain? Yes: NPRS scale: 2/10 Pain location: neck and shoulders  Pain description: stiffness  Aggravating factors: standing  Relieving factors: small pillows   PRECAUTIONS: Cervical and Other: cervical  for comfort   RED FLAGS: None   WEIGHT BEARING RESTRICTIONS: No  FALLS: Has patient fallen in last 6 months? Yes. Number of falls 1  LIVING ENVIRONMENT: Lives with: lives alone Lives in: House/apartment Stairs:  yes, but uses ramp  Has following equipment at home: Walker - 4 wheeled  PLOF: Needs assistance with ADLs and Needs assistance with homemaking  PATIENT GOALS: improve balance, strength and endurance. Stand at kitchen longer and   OBJECTIVE:   DIAGNOSTIC FINDINGS:   EXAM: BILATERAL LOWER EXTREMITY VENOUS DOPPLER ULTRASOUND IMPRESSION: Negative.  EXAM: CT HEAD WITHOUT AND WITH CONTRAST IMPRESSION: 1. Extensive osseous metastatic disease throughout the calvarium. Extraosseous soft tissue tumor in the right orbit appears improved, while the additional areas of extraosseous tumor described in detail above are otherwise overall not significantly changed. 2. Tumor is again inseparable from the anterior aspect of the superior sagittal sinus and left sigmoid sinus, unchanged. 3. Decreased but not resolved infiltrative thickening of the left inferior and medial rectus muscles. 4. Unchanged innumerable scalp nodules, nonspecific but possibly metastatic lesions.     COGNITION: Overall cognitive status: Within functional limits for tasks assessed   SENSATION: WFL  COORDINATION: Limited due to ROM deficits. WFL    MUSCLE TONE: WFL    POSTURE: rounded shoulders, forward head, and decreased lumbar lordosis  LOWER EXTREMITY MMT:   MMT  Right Eval Left Eval  Hip flexion 4 4  Hip extension    Hip abduction 4 4  Hip adduction 4+ 4+  Hip internal rotation    Hip external rotation    Knee flexion 4 4  Knee extension 4+ 4+  Ankle dorsiflexion 5 5  Ankle plantarflexion 4 4  Ankle inversion    Ankle eversion     (Blank rows = not tested)  LOWER EXTREMITY ROM:     To be tested BLE and BUE   BED MOBILITY:  Sit to supine SBA Supine to sit  SBA  TRANSFERS: Assistive device utilized: None  Sit to stand: Modified independence Stand to sit: Modified independence Chair to chair: Modified independence Floor: Modified independence   CURB:  Level of Assistance: CGA Assistive device utilized:  Youth worker Comments: step to pattern  STAIRS: Level of Assistance: CGA Stair Negotiation Technique: Step to Pattern with Bilateral Rails Number of Stairs: 4  Height of Stairs: 6  Comments: step to with BUE support   GAIT: Gait pattern: step through pattern and circumduction- Right Distance walked: 162ft  Assistive device utilized: None Level of assistance: SBA Comments: mild R LE circumduction, but greatly improved since last bout of PT treatment.   FUNCTIONAL TESTS:  5 times sit to stand: 15.82 Timed up and go (TUG): 15.72 6 minute walk test: TBD 10 meter walk test: 10.225sec (0.56m/s)  Berg Balance Scale: 4656 Functional gait assessment: TBD.   PATIENT SURVEYS:  FOTO 63 ( goal 68)  TODAY'S TREATMENT:                                                                                                                              DATE: 01/20/2023  UBE, 2 min forward/ 2 min reverse 1 min rest break between bouts.   Standing in parallel bars.  Hip abduction x 12 RTB  Hip adduction x 12 bil RTB  Seated HS curl on Matrix cable 7.5 # x 12 Seated LAQ 7.5 # x 5 bil  Weighted gait training with 3# AW x 319ft on this day.  Sit<>stand from elevated mat table with BUE on thighs x 11  Wall plank with 3 sec hold x 10  Wall plank with shoulder tap x 10 bil   Wall plank with UE reach x 6 bil   Pt demonstrated improved tolerance to activity on this day required only moderate amount of rest break between exercise bouts.     PATIENT EDUCATION: Education details: Pt educated throughout session about proper posture and technique with exercises. Improved exercise technique, movement at target joints, use of target muscles after min to mod  verbal, visual, tactile cues.  Person educated: Patient and Caregiver MAry Education method: Explanation Education comprehension: verbalized understanding  HOME EXERCISE PROGRAM: Access Code: [email or web address] URL: [email or web address] Date: 12/14/2022 Prepared by: Grier Rocher  Exercises - Mini Squat with Counter Support  - 1 x daily - 7 x weekly - 3 sets - 10 reps - Standing Hip Abduction with Counter Support  - 1 x daily - 7 x weekly - 3 sets - 10 reps - Standing March with Counter Support  - 1 x daily - 7 x weekly - 3 sets - 10 reps - Seated Long Arc Quad  - 1 x daily - 7 x weekly - 3 sets - 10 reps  GOALS: Goals reviewed with patient? Yes  SHORT TERM GOALS: Target date: 01/13/2023    Patient will be independent in home exercise program to improve strength/mobility for better functional independence with ADLs. Baseline:to  given 9/30  10/29. Pt reports inconsistent HEP completion. Goal status: IN PROGRESS   LONG TERM GOALS: Target date: 03/03/2023    Patient will increase FOTO score to equal to or greater than  68   to demonstrate statistically significant improvement in mobility and quality of life.  Baseline: 63  10/29: 74 Goal status: MET  2.  Patient (> 25 years old) will complete five times sit to stand test in < 15 seconds without use of BUE indicating an increased LE  strength and improved balance. Baseline: 15.82 with UE support  10/29: 13.84 with UE support push from seat  Goal status: MET; revised   3.  Patient will increase Berg Balance score by > 6 points to demonstrate decreased fall risk during functional activities Baseline: 46 10/29: 49  Goal status: IN PROGRESS  4.  Patient will increase 6 min walk test to >126ft as to improve gait speed for better community ambulation and to reduce fall risk. Baseline: 950 10/29 1184ft  Goal status: MET ; revised   5.  Patient will reduce timed up and go to <11 seconds to reduce fall risk and  demonstrate improved transfer/gait ability. Baseline: 15.72 10/29: 9.97sec  Goal status: MET  6.  Patient will increase FGA score to >22 as to demonstrate reduced fall risk and improved dynamic gait balance for better safety with community/home ambulation.   Baseline:19 10/29 24 Goal status: MET   ASSESSMENT:  CLINICAL IMPRESSION: Patient limited due to weakness secondary to CA treatment. Pt demonstrated improved tolerance to activity, so increased repetitions and weights utilized compared to prior session. Pt reports mild swelling in R elbow following injection. Pt demonstrates improved posture and step length with weighted gait training on this day.   Instructed to monitor for increased edema, redness and bruising and notify MD if it worsens.  Pt will continue to benefit from skilled PT to address balance, strength and ROM deficits, improve safety and QoL while continuing CA treatment.       OBJECTIVE IMPAIRMENTS: Abnormal gait, cardiopulmonary status limiting activity, decreased activity tolerance, decreased balance, decreased endurance, decreased knowledge of condition, decreased mobility, difficulty walking, decreased ROM, decreased strength, hypomobility, increased fascial restrictions, impaired perceived functional ability, impaired flexibility, impaired UE functional use, improper body mechanics, postural dysfunction, and pain.   ACTIVITY LIMITATIONS: carrying, lifting, bending, standing, squatting, sleeping, stairs, transfers, bathing, toileting, dressing, reach over head, and locomotion level  PARTICIPATION LIMITATIONS: cleaning, laundry, driving, shopping, community activity, and yard work  PERSONAL FACTORS: Age, Behavior pattern, Fitness, Past/current experiences, and 1-2 comorbidities: CA with profound metastases   are also affecting patient's functional outcome.   REHAB POTENTIAL: Good  CLINICAL DECISION MAKING: Evolving/moderate complexity  EVALUATION COMPLEXITY:  Moderate  PLAN:  PT FREQUENCY: 1-2x/week  PT DURATION: 12 weeks  PLANNED INTERVENTIONS: Therapeutic exercises, Therapeutic activity, Neuromuscular re-education, Balance training, Gait training, Patient/Family education, Self Care, Joint mobilization, Stair training, DME instructions, Cryotherapy, Moist heat, and Manual therapy  PLAN FOR NEXT SESSION:   Continue BLE strengthening and balance training.  UE and cervical ROM as tolerated.    Golden Pop, PT 01/20/2023, 1:31 PM

## 2023-01-20 NOTE — Progress Notes (Signed)
Received call from pt stating she is experiencing sore throat and tight esophagus.  Per MD during visit on 01/14/23 this was addressed and pt referred to GI for esophageal stricture and GERD.  Pt educated to contact GI office for appt and further evaluation.  Pt verbalized understanding.

## 2023-01-25 ENCOUNTER — Ambulatory Visit: Payer: Commercial Managed Care - PPO | Admitting: Physical Therapy

## 2023-01-25 ENCOUNTER — Ambulatory Visit: Payer: Medicaid Other | Admitting: Physical Therapy

## 2023-01-25 DIAGNOSIS — M6281 Muscle weakness (generalized): Secondary | ICD-10-CM | POA: Diagnosis not present

## 2023-01-25 DIAGNOSIS — M25612 Stiffness of left shoulder, not elsewhere classified: Secondary | ICD-10-CM

## 2023-01-25 DIAGNOSIS — M25562 Pain in left knee: Secondary | ICD-10-CM

## 2023-01-25 DIAGNOSIS — M25611 Stiffness of right shoulder, not elsewhere classified: Secondary | ICD-10-CM

## 2023-01-25 DIAGNOSIS — M542 Cervicalgia: Secondary | ICD-10-CM

## 2023-01-25 DIAGNOSIS — R262 Difficulty in walking, not elsewhere classified: Secondary | ICD-10-CM

## 2023-01-25 DIAGNOSIS — R29898 Other symptoms and signs involving the musculoskeletal system: Secondary | ICD-10-CM

## 2023-01-25 DIAGNOSIS — M436 Torticollis: Secondary | ICD-10-CM

## 2023-01-25 NOTE — Therapy (Signed)
OUTPATIENT PHYSICAL THERAPY    Patient Name: Kathryn Lucas MRN: [id number] DOB:Aug 10, 1960, 62 y.o., female Today's Date: 01/25/2023   PCP: Ollen Bowl, MD  REFERRING PROVIDER: [name], MD   END OF SESSION:  PT End of Session - 01/25/23 1157     Visit Number 13    Number of Visits 24    Date for PT Re-Evaluation 03/01/23    Progress Note Due on Visit 20    PT Start Time 1154    PT Stop Time 1230    PT Time Calculation (min) 36 min    Equipment Utilized During Treatment Gait belt    Behavior During Therapy WFL for tasks assessed/performed             Past Medical History:  Diagnosis Date   Breast cancer (HCC)    left breast cancer   Cancer (HCC) 09/2017   left breast cancer   Family history of breast cancer    Hypothyroidism    Personal history of radiation therapy 2019   Thyroid disease    Past Surgical History:  Procedure Laterality Date   BREAST BIOPSY Left 2018   BREAST BIOPSY Left 2019   BREAST RECONSTRUCTION WITH PLACEMENT OF TISSUE EXPANDER AND ALLODERM Left 10/26/2017   Procedure: LEFT BREAST RECONSTRUCTION WITH PLACEMENT OF TISSUE EXPANDER AND ALLODERM;  Surgeon: Glenna Fellows, MD;  Location: [facility];  Service: Plastics;  Laterality: Left;   MASTECTOMY Left 2019   MASTECTOMY WITH RADIOACTIVE SEED GUIDED EXCISION AND AXILLARY SENTINEL LYMPH NODE BIOPSY Left 10/26/2017   Procedure: LEFT MASTECTOMY WITH SEED TARGETED  LEFT AXILLARY LYMPH NODE EXCISION AND LEFT SENTINEL LYMPH NODE BIOPSY;  Surgeon: Almond Lint, MD;  Location: [facility];  Service: General;  Laterality: Left;   TONSILLECTOMY     WISDOM TOOTH EXTRACTION     Patient Active Problem List   Diagnosis Date Noted   Metastatic 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) 10/14/2022   Elevated liver enzymes 10/14/2022   Unexplained weight loss 10/14/2022   Neoplasm related pain 10/14/2022   Metastatic malignant neoplasm (HCC) 12/19/2021   Family history of breast  cancer    History of therapeutic radiation 04/27/2018   Breast cancer, left breast (HCC) 10/26/2017   Malignant neoplasm of lower-outer quadrant of left breast of female, estrogen receptor positive (HCC) 07/15/2016   Plantar fasciitis 07/30/2015   Metatarsalgia 10/18/2014    ONSET DATE: September 2023   REFERRING DIAG:  Diagnosis  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 Appointment with Dietician this afternoon. No other updates.   Pt accompanied by:  none.   PERTINENT HISTORY:   Pt is 62 y.o. female that is seeking therapy for weakness of the R LE. Pt tripped over the threshold of her door back in September of 2023, and had a transverse fracture of the L patella when she fell on it. She was recently diagnosed with bone cancer back in October of 2023. Pt notes that she has more stiffness in the neck and pelvis region because the cancer is located in those locations.   Pt had exacerbation of s/s in June of 2024 with hospitalization and subsequent change in oncology treatment limiting ability to attend PT. Pt has responded well to treatment and now able to attend PT.   PAIN:  Are you having pain? Yes: NPRS scale: 2/10 Pain location: neck and shoulders  Pain description: stiffness  Aggravating factors: standing  Relieving factors: small pillows   PRECAUTIONS:  Cervical and Other: cervical for comfort   RED FLAGS: None   WEIGHT BEARING RESTRICTIONS: No  FALLS: Has patient fallen in last 6 months? Yes. Number of falls 1  LIVING ENVIRONMENT: Lives with: lives alone Lives in: House/apartment Stairs:  yes, but uses ramp  Has following equipment at home: Walker - 4 wheeled  PLOF: Needs assistance with ADLs and Needs assistance with homemaking  PATIENT GOALS: improve balance, strength and endurance. Stand at kitchen longer and   OBJECTIVE:   DIAGNOSTIC FINDINGS:   EXAM: BILATERAL LOWER EXTREMITY VENOUS DOPPLER ULTRASOUND IMPRESSION: Negative.  EXAM: CT HEAD WITHOUT AND WITH CONTRAST IMPRESSION: 1. Extensive osseous metastatic disease throughout the calvarium. Extraosseous soft tissue tumor in the right orbit appears improved, while the additional areas of extraosseous tumor described in detail above are otherwise overall not significantly changed. 2. Tumor is again inseparable from the anterior aspect of the superior sagittal sinus and left sigmoid sinus, unchanged. 3. Decreased but not resolved infiltrative thickening of the left inferior and medial rectus muscles. 4. Unchanged innumerable scalp nodules, nonspecific but possibly metastatic lesions.     COGNITION: Overall cognitive status: Within functional limits for tasks assessed   SENSATION: WFL  COORDINATION: Limited due to ROM deficits. WFL    MUSCLE TONE: WFL    POSTURE: rounded shoulders, forward head, and decreased lumbar lordosis  LOWER EXTREMITY MMT:   MMT  Right Eval Left Eval  Hip flexion 4 4  Hip extension    Hip abduction 4 4  Hip adduction 4+ 4+  Hip internal rotation    Hip external rotation    Knee flexion 4 4  Knee extension 4+ 4+  Ankle dorsiflexion 5 5  Ankle plantarflexion 4 4  Ankle inversion    Ankle eversion     (Blank rows = not tested)  LOWER EXTREMITY ROM:     To be tested BLE and BUE   BED MOBILITY:  Sit to supine  SBA Supine to sit SBA  TRANSFERS: Assistive device utilized: None  Sit to stand: Modified independence Stand to sit: Modified independence Chair to chair: Modified independence Floor: Modified independence   CURB:  Level of Assistance: CGA Assistive device utilized:  Youth worker Comments: step to pattern  STAIRS: Level of Assistance: CGA Stair Negotiation Technique: Step to Pattern with Bilateral Rails Number of Stairs: 4  Height of Stairs: 6  Comments: step to with BUE support   GAIT: Gait pattern: step through pattern and circumduction- Right  Distance walked: 154ft Assistive device utilized: None Level of assistance: SBA Comments: mild R LE circumduction, but greatly improved since last bout of PT treatment.   FUNCTIONAL TESTS:  5 times sit to stand: 15.82 Timed up and go (TUG): 15.72 6 minute walk test: TBD 10 meter walk test: 10.225sec (0.28m/s)  Berg Balance Scale: 4656 Functional gait assessment: TBD.   PATIENT SURVEYS:  FOTO 63 ( goal 68)  TODAY'S TREATMENT:                                                                                                                              DATE: 01/25/2023  UBE, 2 min forward/ 2 min reverse 1 min rest break between bouts.   Gait with 4# AW x 416ft.  Hip flexion 3 sec hold x 12 bil 4#AW at rail  Hip extension 3 sec hold x 12 bil  4 #AW at rail  Figure 4 in order x 5 bil with 4# AW cues for increased step length in all directions.  Lateral Step up/down 4inch step x 6 bil  1 foot on airex pad 2 x 30 sec bil   Pt demonstrated improved tolerance to activity on this day required only moderate amount of rest break between exercise bouts.   CGA for step up/down movement with no UE support, all other therex performed with supervision assist.    PATIENT EDUCATION: Education details: Pt educated throughout session about proper posture and technique with exercises. Improved exercise technique, movement at target joints, use  of target muscles after min to mod verbal, visual, tactile cues.  Person educated: Patient and Caregiver MAry Education method: Explanation Education comprehension: verbalized understanding  HOME EXERCISE PROGRAM: Access Code: [email or web address] URL: [email or web address] Date: 12/14/2022 Prepared by: Grier Rocher  Exercises - Mini Squat with Counter Support  - 1 x daily - 7 x weekly - 3 sets - 10 reps - Standing Hip Abduction with Counter Support  - 1 x daily - 7 x weekly - 3 sets - 10 reps - Standing March with Counter Support  - 1 x daily - 7 x weekly - 3 sets - 10 reps - Seated Long Arc Quad  - 1 x daily - 7 x weekly - 3 sets - 10 reps  GOALS: Goals reviewed with patient? Yes  SHORT TERM GOALS: Target date: 01/13/2023    Patient will be independent in home exercise program to improve strength/mobility for better functional independence with ADLs. Baseline:to  given 9/30  10/29. Pt reports inconsistent HEP completion. Goal status: IN PROGRESS   LONG TERM GOALS: Target date: 03/03/2023    Patient will increase FOTO score to equal to or greater than  68   to demonstrate statistically significant improvement in mobility and quality of life.  Baseline: 63  10/29: 74 Goal status: MET  2.  Patient (> 66 years old) will complete five times sit to stand test in < 15 seconds without use of BUE  indicating an increased LE strength and improved balance. Baseline: 15.82 with UE support  10/29: 13.84 with UE support push from seat  Goal status: MET; revised   3.  Patient will increase Berg Balance score by > 6 points to demonstrate decreased fall risk during functional activities Baseline: 46 10/29: 49  Goal status: IN PROGRESS  4.  Patient will increase 6 min walk test to >1271ft as to improve gait speed for better community ambulation and to reduce fall risk. Baseline: 950 10/29 1155ft  Goal status: MET ; revised   5.  Patient will reduce timed up and go to <11  seconds to reduce fall risk and demonstrate improved transfer/gait ability. Baseline: 15.72 10/29: 9.97sec  Goal status: MET  6.  Patient will increase FGA score to >22 as to demonstrate reduced fall risk and improved dynamic gait balance for better safety with community/home ambulation.   Baseline:19 10/29 24 Goal status: MET   ASSESSMENT:  CLINICAL IMPRESSION: Patient limited by late arrival to treatment. Noted to have improved activity tolerance on this day as well as improved confidence with step management without AD. Noted to require assist with lateral step ups, but was able to increase step length in all planes with 4 square weighted stepping.  Pt will continue to benefit from skilled PT to address balance, strength and ROM deficits, improve safety and QoL while continuing CA treatment.       OBJECTIVE IMPAIRMENTS: Abnormal gait, cardiopulmonary status limiting activity, decreased activity tolerance, decreased balance, decreased endurance, decreased knowledge of condition, decreased mobility, difficulty walking, decreased ROM, decreased strength, hypomobility, increased fascial restrictions, impaired perceived functional ability, impaired flexibility, impaired UE functional use, improper body mechanics, postural dysfunction, and pain.   ACTIVITY LIMITATIONS: carrying, lifting, bending, standing, squatting, sleeping, stairs, transfers, bathing, toileting, dressing, reach over head, and locomotion level  PARTICIPATION LIMITATIONS: cleaning, laundry, driving, shopping, community activity, and yard work  PERSONAL FACTORS: Age, Behavior pattern, Fitness, Past/current experiences, and 1-2 comorbidities: CA with profound metastases   are also affecting patient's functional outcome.   REHAB POTENTIAL: Good  CLINICAL DECISION MAKING: Evolving/moderate complexity  EVALUATION COMPLEXITY: Moderate  PLAN:  PT FREQUENCY: 1-2x/week  PT DURATION: 12 weeks  PLANNED INTERVENTIONS:  Therapeutic exercises, Therapeutic activity, Neuromuscular re-education, Balance training, Gait training, Patient/Family education, Self Care, Joint mobilization, Stair training, DME instructions, Cryotherapy, Moist heat, and Manual therapy  PLAN FOR NEXT SESSION:   Continue BLE strengthening and balance training.  UE and cervical ROM as tolerated.    Golden Pop, PT 01/25/2023, 2:05 PM

## 2023-01-27 ENCOUNTER — Ambulatory Visit: Payer: Commercial Managed Care - PPO | Admitting: Physical Therapy

## 2023-01-27 ENCOUNTER — Ambulatory Visit: Payer: Medicaid Other | Admitting: Physical Therapy

## 2023-01-27 DIAGNOSIS — M25562 Pain in left knee: Secondary | ICD-10-CM

## 2023-01-27 DIAGNOSIS — M542 Cervicalgia: Secondary | ICD-10-CM

## 2023-01-27 DIAGNOSIS — M6281 Muscle weakness (generalized): Secondary | ICD-10-CM | POA: Diagnosis not present

## 2023-01-27 DIAGNOSIS — M25611 Stiffness of right shoulder, not elsewhere classified: Secondary | ICD-10-CM

## 2023-01-27 DIAGNOSIS — R29898 Other symptoms and signs involving the musculoskeletal system: Secondary | ICD-10-CM

## 2023-01-27 DIAGNOSIS — M25612 Stiffness of left shoulder, not elsewhere classified: Secondary | ICD-10-CM

## 2023-01-27 DIAGNOSIS — M436 Torticollis: Secondary | ICD-10-CM

## 2023-01-27 DIAGNOSIS — R262 Difficulty in walking, not elsewhere classified: Secondary | ICD-10-CM

## 2023-01-27 NOTE — Therapy (Unsigned)
OUTPATIENT PHYSICAL THERAPY    Patient Name: Kathryn SIELOFF MRN: [id number] DOB:06-12-1960, 62 y.o., female Today's Date: 01/27/2023   PCP: Ollen Bowl, MD  REFERRING PROVIDER: [name], MD   END OF SESSION:  PT End of Session - 01/27/23 1650     Visit Number 14    Number of Visits 24    Date for PT Re-Evaluation 03/01/23    Progress Note Due on Visit 20    PT Start Time 1621    PT Stop Time 1700    PT Time Calculation (min) 39 min    Equipment Utilized During Treatment Gait belt    Behavior During Therapy WFL for tasks assessed/performed             Past Medical History:  Diagnosis Date   Breast cancer (HCC)    left breast cancer   Cancer (HCC) 09/2017   left breast cancer   Family history of breast cancer    Hypothyroidism    Personal history of radiation therapy 2019   Thyroid disease    Past Surgical History:  Procedure Laterality Date   BREAST BIOPSY Left 2018   BREAST BIOPSY Left 2019   BREAST RECONSTRUCTION WITH PLACEMENT OF TISSUE EXPANDER AND ALLODERM Left 10/26/2017   Procedure: LEFT BREAST RECONSTRUCTION WITH PLACEMENT OF TISSUE EXPANDER AND ALLODERM;  Surgeon: Glenna Fellows, MD;  Location: [facility];  Service: Plastics;  Laterality: Left;   MASTECTOMY Left 2019   MASTECTOMY WITH RADIOACTIVE SEED GUIDED EXCISION AND AXILLARY SENTINEL LYMPH NODE BIOPSY Left 10/26/2017   Procedure: LEFT MASTECTOMY WITH SEED TARGETED  LEFT AXILLARY LYMPH NODE EXCISION AND LEFT SENTINEL LYMPH NODE BIOPSY;  Surgeon: Almond Lint, MD;  Location: [facility];  Service: General;  Laterality: Left;   TONSILLECTOMY     WISDOM TOOTH EXTRACTION     Patient Active Problem List   Diagnosis Date Noted   Metastatic 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) 10/14/2022   Elevated liver enzymes 10/14/2022   Unexplained weight loss 10/14/2022   Neoplasm related pain 10/14/2022   Metastatic malignant neoplasm (HCC) 12/19/2021   Family history of breast  cancer    History of therapeutic radiation 04/27/2018   Breast cancer, left breast (HCC) 10/26/2017   Malignant neoplasm of lower-outer quadrant of left breast of female, estrogen receptor positive (HCC) 07/15/2016   Plantar fasciitis 07/30/2015   Metatarsalgia 10/18/2014    ONSET DATE: September 2023   REFERRING DIAG:  Diagnosis  C79.51 (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 Appointment with Dietician this afternoon. No other updates.   Pt accompanied by:  none.   PERTINENT HISTORY:   Pt is 62 y.o. female that is seeking therapy for weakness of the R LE. Pt tripped over the threshold of her door back in September of 2023, and had a transverse fracture of the L patella when she fell on it. She was recently diagnosed with bone cancer back in October of 2023. Pt notes that she has more stiffness in the neck and pelvis region because the cancer is located in those locations.   Pt had exacerbation of s/s in June of 2024 with hospitalization and subsequent change in oncology treatment limiting ability to attend PT. Pt has responded well to treatment and now able to attend PT.   PAIN:  Are you having pain? Yes: NPRS scale: 2/10 Pain location: neck and shoulders  Pain description: stiffness  Aggravating factors: standing  Relieving factors: small pillows   PRECAUTIONS:  Cervical and Other: cervical for comfort   RED FLAGS: None   WEIGHT BEARING RESTRICTIONS: No  FALLS: Has patient fallen in last 6 months? Yes. Number of falls 1  LIVING ENVIRONMENT: Lives with: lives alone Lives in: House/apartment Stairs:  yes, but uses ramp  Has following equipment at home: Walker - 4 wheeled  PLOF: Needs assistance with ADLs and Needs assistance with homemaking  PATIENT GOALS: improve balance, strength and endurance. Stand at kitchen longer and   OBJECTIVE:   DIAGNOSTIC FINDINGS:   EXAM: BILATERAL LOWER EXTREMITY VENOUS DOPPLER ULTRASOUND IMPRESSION: Negative.  EXAM: CT HEAD WITHOUT AND WITH CONTRAST IMPRESSION: 1. Extensive osseous metastatic disease throughout the calvarium. Extraosseous soft tissue tumor in the right orbit appears improved, while the additional areas of extraosseous tumor described in detail above are otherwise overall not significantly changed. 2. Tumor is again inseparable from the anterior aspect of the superior sagittal sinus and left sigmoid sinus, unchanged. 3. Decreased but not resolved infiltrative thickening of the left inferior and medial rectus muscles. 4. Unchanged innumerable scalp nodules, nonspecific but possibly metastatic lesions.     COGNITION: Overall cognitive status: Within functional limits for tasks assessed   SENSATION: WFL  COORDINATION: Limited due to ROM deficits. WFL    MUSCLE TONE: WFL    POSTURE: rounded shoulders, forward head, and decreased lumbar lordosis  LOWER EXTREMITY MMT:   MMT  Right Eval Left Eval  Hip flexion 4 4  Hip extension    Hip abduction 4 4  Hip adduction 4+ 4+  Hip internal rotation    Hip external rotation    Knee flexion 4 4  Knee extension 4+ 4+  Ankle dorsiflexion 5 5  Ankle plantarflexion 4 4  Ankle inversion    Ankle eversion     (Blank rows = not tested)  LOWER EXTREMITY ROM:     To be tested BLE and BUE   BED MOBILITY:  Sit to supine  SBA Supine to sit SBA  TRANSFERS: Assistive device utilized: None  Sit to stand: Modified independence Stand to sit: Modified independence Chair to chair: Modified independence Floor: Modified independence   CURB:  Level of Assistance: CGA Assistive device utilized:  Youth worker Comments: step to pattern  STAIRS: Level of Assistance: CGA Stair Negotiation Technique: Step to Pattern with Bilateral Rails Number of Stairs: 4  Height of Stairs: 6  Comments: step to with BUE support   GAIT: Gait pattern: step through pattern and circumduction- Right  Distance walked: 145ft Assistive device utilized: None Level of assistance: SBA Comments: mild R LE circumduction, but greatly improved since last bout of PT treatment.   FUNCTIONAL TESTS:  5 times sit to stand: 15.82 Timed up and go (TUG): 15.72 6 minute walk test: TBD 10 meter walk test: 10.225sec (0.100m/s)  Berg Balance Scale: 4656 Functional gait assessment: TBD.   PATIENT SURVEYS:  FOTO 63 ( goal 68)  TODAY'S TREATMENT:                                                                                                                              DATE: 01/27/2023  UBE, 2 min forward/ 2 min reverse 1 min rest break between bouts.   Resisted gait in Matrix cable machine 7.5 # forward x 4 12.5# reverse x 4  7.5# side stepping R x 4 and L x 4.  Therapeutic rest break between sets.   Stair management step through gait pattern with BUE support x 3 sets of 4 and 1 set of 4 steps with UE support and step through pattern.   Side stepping up 6 inch without UE support on the RLE x 8 and intermittent UE support on the L x 8   Forward lunge x 8 bil with UE supported in parallel bars. No pain  Partial squat x 8 with BUE support. Mild knee pain on the RLE limits increased repetitions.   Pt demonstrated improved tolerance to activity on this day required only moderate amount of rest break between exercise bouts.      PATIENT  EDUCATION: Education details: Pt educated throughout session about proper posture and technique with exercises. Improved exercise technique, movement at target joints, use of target muscles after min to mod verbal, visual, tactile cues.  Person educated: Patient and Caregiver MAry Education method: Explanation Education comprehension: verbalized understanding  HOME EXERCISE PROGRAM: Access Code: [email or web address] URL: [email or web address] Date: 12/14/2022 Prepared by: Grier Rocher  Exercises - Mini Squat with Counter Support  - 1 x daily - 7 x weekly - 3 sets - 10 reps - Standing Hip Abduction with Counter Support  - 1 x daily - 7 x weekly - 3 sets - 10 reps - Standing March with Counter Support  - 1 x daily - 7 x weekly - 3 sets - 10 reps - Seated Long Arc Quad  - 1 x daily - 7 x weekly - 3 sets - 10 reps  GOALS: Goals reviewed with patient? Yes  SHORT TERM GOALS: Target date: 01/13/2023    Patient will be independent in home exercise program to improve strength/mobility for better functional independence with ADLs. Baseline:to  given 9/30  10/29. Pt reports inconsistent HEP completion. Goal status: IN PROGRESS   LONG TERM GOALS: Target date: 03/03/2023    Patient will increase FOTO score to equal to or greater than  68   to demonstrate statistically significant improvement in mobility and quality of life.  Baseline: 63  10/29: 74 Goal status: MET  2.  Patient (> 31 years old) will complete five times sit to stand test in < 15 seconds without use of BUE indicating an increased LE strength and improved balance. Baseline: 15.82 with UE support  10/29: 13.84 with UE support push from seat  Goal status: MET; revised   3.  Patient will increase Berg Balance score by > 6 points to demonstrate decreased fall risk during functional activities Baseline: 46 10/29: 49  Goal status: IN PROGRESS  4.  Patient will increase 6 min walk test to >1282ft as to improve gait  speed for better community ambulation and to reduce fall risk. Baseline: 950 10/29 1159ft  Goal status: MET ; revised   5.  Patient will reduce timed up and go to <11 seconds to reduce fall risk and demonstrate improved transfer/gait ability. Baseline: 15.72 10/29: 9.97sec  Goal status: MET  6.  Patient will increase FGA score to >22 as to demonstrate reduced fall risk and improved dynamic gait balance for better safety with community/home ambulation.   Baseline:19 10/29 24 Goal status: MET   ASSESSMENT:  CLINICAL IMPRESSION: Patient limited by late arrival to treatment. Noted to have improved activity tolerance on this day as well as improved confidence with step management without AD. Pt tolerates increased resistance with weight gait as well as improved management of steps without UE support in all planes. Mild knee pain in the r knee limits squats on this day, but pain resolved with short rest break. Pt will continue to benefit from skilled PT to address balance, strength and ROM deficits, improve safety and QoL while continuing CA treatment.       OBJECTIVE IMPAIRMENTS: Abnormal gait, cardiopulmonary status limiting activity, decreased activity tolerance, decreased balance, decreased endurance, decreased knowledge of condition, decreased mobility, difficulty walking, decreased ROM, decreased strength, hypomobility, increased fascial restrictions, impaired perceived functional ability, impaired flexibility, impaired UE functional use, improper body mechanics, postural dysfunction, and pain.   ACTIVITY LIMITATIONS: carrying, lifting, bending, standing, squatting, sleeping, stairs, transfers, bathing, toileting, dressing, reach over head, and locomotion level  PARTICIPATION LIMITATIONS: cleaning, laundry, driving, shopping, community activity, and yard work  PERSONAL FACTORS: Age, Behavior pattern, Fitness, Past/current experiences, and 1-2 comorbidities: CA with profound metastases    are also affecting patient's functional outcome.   REHAB POTENTIAL: Good  CLINICAL DECISION MAKING: Evolving/moderate complexity  EVALUATION COMPLEXITY: Moderate  PLAN:  PT FREQUENCY: 1-2x/week  PT DURATION: 12 weeks  PLANNED INTERVENTIONS: Therapeutic exercises, Therapeutic activity, Neuromuscular re-education, Balance training, Gait training, Patient/Family education, Self Care, Joint mobilization, Stair training, DME instructions, Cryotherapy, Moist heat, and Manual therapy  PLAN FOR NEXT SESSION:   Continue BLE strengthening and balance training.  UE and cervical ROM as tolerated.    Golden Pop, PT 01/27/2023, 5:03 PM

## 2023-02-01 ENCOUNTER — Ambulatory Visit: Payer: Commercial Managed Care - PPO | Admitting: Physical Therapy

## 2023-02-01 ENCOUNTER — Ambulatory Visit: Payer: Medicaid Other | Admitting: Physical Therapy

## 2023-02-01 DIAGNOSIS — R29898 Other symptoms and signs involving the musculoskeletal system: Secondary | ICD-10-CM

## 2023-02-01 DIAGNOSIS — M542 Cervicalgia: Secondary | ICD-10-CM

## 2023-02-01 DIAGNOSIS — M25612 Stiffness of left shoulder, not elsewhere classified: Secondary | ICD-10-CM

## 2023-02-01 DIAGNOSIS — M6281 Muscle weakness (generalized): Secondary | ICD-10-CM | POA: Diagnosis not present

## 2023-02-01 DIAGNOSIS — M25611 Stiffness of right shoulder, not elsewhere classified: Secondary | ICD-10-CM

## 2023-02-01 DIAGNOSIS — M436 Torticollis: Secondary | ICD-10-CM

## 2023-02-01 DIAGNOSIS — M25562 Pain in left knee: Secondary | ICD-10-CM

## 2023-02-01 DIAGNOSIS — R262 Difficulty in walking, not elsewhere classified: Secondary | ICD-10-CM

## 2023-02-01 NOTE — Therapy (Signed)
OUTPATIENT PHYSICAL THERAPY    Patient Name: Kathryn Lucas MRN: [id number] DOB:05/17/1960, 62 y.o., female Today's Date: 02/01/2023   PCP: Ollen Bowl, MD  REFERRING PROVIDER: [name], MD   END OF SESSION:  PT End of Session - 02/01/23 1149     Visit Number 15    Number of Visits 24    Date for PT Re-Evaluation 03/01/23    Progress Note Due on Visit 20    PT Start Time 1150    PT Stop Time 1230    PT Time Calculation (min) 40 min    Equipment Utilized During Treatment Gait belt    Behavior During Therapy WFL for tasks assessed/performed             Past Medical History:  Diagnosis Date   Breast cancer (HCC)    left breast cancer   Cancer (HCC) 09/2017   left breast cancer   Family history of breast cancer    Hypothyroidism    Personal history of radiation therapy 2019   Thyroid disease    Past Surgical History:  Procedure Laterality Date   BREAST BIOPSY Left 2018   BREAST BIOPSY Left 2019   BREAST RECONSTRUCTION WITH PLACEMENT OF TISSUE EXPANDER AND ALLODERM Left 10/26/2017   Procedure: LEFT BREAST RECONSTRUCTION WITH PLACEMENT OF TISSUE EXPANDER AND ALLODERM;  Surgeon: Glenna Fellows, MD;  Location: [facility];  Service: Plastics;  Laterality: Left;   MASTECTOMY Left 2019   MASTECTOMY WITH RADIOACTIVE SEED GUIDED EXCISION AND AXILLARY SENTINEL LYMPH NODE BIOPSY Left 10/26/2017   Procedure: LEFT MASTECTOMY WITH SEED TARGETED  LEFT AXILLARY LYMPH NODE EXCISION AND LEFT SENTINEL LYMPH NODE BIOPSY;  Surgeon: Almond Lint, MD;  Location: [facility];  Service: General;  Laterality: Left;   TONSILLECTOMY     WISDOM TOOTH EXTRACTION     Patient Active Problem List   Diagnosis Date Noted   Metastatic 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) 10/14/2022   Elevated liver enzymes 10/14/2022   Unexplained weight loss 10/14/2022   Neoplasm related pain 10/14/2022   Metastatic malignant neoplasm (HCC) 12/19/2021   Family history of breast  cancer    History of therapeutic radiation 04/27/2018   Breast cancer, left breast (HCC) 10/26/2017   Malignant neoplasm of lower-outer quadrant of left breast of female, estrogen receptor positive (HCC) 07/15/2016   Plantar fasciitis 07/30/2015   Metatarsalgia 10/18/2014    ONSET DATE: September 2023   REFERRING DIAG:  Diagnosis  C79.51 (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 Fell asleep in car over the weekend while with sister, resulting in significant neck pain on Saturday. Improved today, but still stiff.   Pt accompanied by:  none.   PERTINENT HISTORY:   Pt is 62 y.o. female that is seeking therapy for weakness of the R LE. Pt tripped over the threshold of her door back in September of 2023, and had a transverse fracture of the L patella when she fell on it. She was recently diagnosed with bone cancer back in October of 2023. Pt notes that she has more stiffness in the neck and pelvis region because the cancer is located in those locations.   Pt had exacerbation of s/s in June of 2024 with hospitalization and subsequent change in oncology treatment limiting ability to attend PT. Pt has responded well to treatment and now able to attend PT.   PAIN:  Are you having pain? Yes: NPRS scale: 2/10 Pain location: neck and shoulders  Pain description: stiffness   Aggravating factors: standing  Relieving factors: small pillows   PRECAUTIONS: Cervical and Other: cervical for comfort   RED FLAGS: None   WEIGHT BEARING RESTRICTIONS: No  FALLS: Has patient fallen in last 6 months? Yes. Number of falls 1  LIVING ENVIRONMENT: Lives with: lives alone Lives in: House/apartment Stairs:  yes, but uses ramp  Has following equipment at home: Walker - 4 wheeled  PLOF: Needs assistance with ADLs and Needs assistance with homemaking  PATIENT GOALS: improve balance, strength and endurance. Stand at kitchen longer and   OBJECTIVE:   DIAGNOSTIC FINDINGS:   EXAM: BILATERAL LOWER EXTREMITY VENOUS DOPPLER ULTRASOUND IMPRESSION: Negative.  EXAM: CT HEAD WITHOUT AND WITH CONTRAST IMPRESSION: 1. Extensive osseous metastatic disease throughout the calvarium. Extraosseous soft tissue tumor in the right orbit appears improved, while the additional areas of extraosseous tumor described in detail above are otherwise overall not significantly changed. 2. Tumor is again inseparable from the anterior aspect of the superior sagittal sinus and left sigmoid sinus, unchanged. 3. Decreased but not resolved infiltrative thickening of the left inferior and medial rectus muscles. 4. Unchanged innumerable scalp nodules, nonspecific but possibly metastatic lesions.     COGNITION: Overall cognitive status: Within functional limits for tasks assessed   SENSATION: WFL  COORDINATION: Limited due to ROM deficits. WFL    MUSCLE TONE: WFL    POSTURE: rounded shoulders, forward head, and decreased lumbar lordosis  LOWER EXTREMITY MMT:   MMT  Right Eval Left Eval  Hip flexion 4 4  Hip extension    Hip abduction 4 4  Hip adduction 4+ 4+  Hip internal rotation    Hip external rotation    Knee flexion 4 4  Knee extension 4+ 4+  Ankle dorsiflexion 5 5  Ankle plantarflexion 4 4  Ankle inversion    Ankle eversion     (Blank rows = not tested)  LOWER  EXTREMITY ROM:     To be tested BLE and BUE   BED MOBILITY:  Sit to supine SBA Supine to sit SBA  TRANSFERS: Assistive device utilized: None  Sit to stand: Modified independence Stand to sit: Modified independence Chair to chair: Modified independence Floor: Modified independence   CURB:  Level of Assistance: CGA Assistive device utilized:  Youth worker Comments: step to pattern  STAIRS: Level of Assistance: CGA Stair Negotiation Technique: Step to Pattern with Bilateral Rails Number of Stairs: 4  Height of Stairs: 6  Comments: step to  with BUE support   GAIT: Gait pattern: step through pattern and circumduction- Right Distance walked: 159ft Assistive device utilized: None Level of assistance: SBA Comments: mild R LE circumduction, but greatly improved since last bout of PT treatment.   FUNCTIONAL TESTS:  5 times sit to stand: 15.82 Timed up and go (TUG): 15.72 6 minute walk test: TBD 10 meter walk test: 10.225sec (0.32m/s)  Berg Balance Scale: 4656 Functional gait assessment: TBD.   PATIENT SURVEYS:  FOTO 63 ( goal 68)  TODAY'S TREATMENT:                                                                                                                              DATE: 02/01/2023  UBE, 2 min forward/ 2 min reverse 1 min rest break between bouts.   Standing BLE therex;  Hip abduction GTB x 10  Hip extension GTB x 10  Sitting hip flexion GTB x 12  Sitting LAQ x 10 GTB  Sitting hip abduction GTB x 15  Sitting adduction x 15 therapby ball   Reports tightness in neck from looking at feet in standing and tightness in the knee with LAQ on the LLE.   Sitting EOB UT stretch 2  x30 sec bil  Supine manual therapy to improve muscle extensibility. STM with TP release to SCM, splenius capitus, superior UT . X 10  minutes total.      PATIENT EDUCATION: Education details: Pt educated throughout session about proper posture and technique with exercises. Improved  exercise technique, movement at target joints, use of target muscles after min to mod verbal, visual, tactile cues.  Person educated: Patient and Caregiver MAry Education method: Explanation Education comprehension: verbalized understanding  HOME EXERCISE PROGRAM: Access Code: [email or web address] URL: [email or web address] Date: 12/14/2022 Prepared by: Grier Rocher  Exercises - Mini Squat with Counter Support  - 1 x daily - 7 x weekly - 3 sets - 10 reps - Standing Hip Abduction with Counter Support  - 1 x daily - 7 x weekly - 3 sets - 10 reps - Standing March with Counter Support  - 1 x daily - 7 x weekly - 3 sets - 10 reps - Seated Long Arc Quad  - 1 x daily - 7 x weekly - 3 sets - 10 reps  GOALS: Goals reviewed with patient? Yes  SHORT TERM GOALS: Target date: 01/13/2023    Patient will be independent in home exercise program to improve strength/mobility for better functional independence with ADLs. Baseline:to  given 9/30  10/29. Pt reports inconsistent HEP completion. Goal status: IN PROGRESS   LONG TERM GOALS: Target date: 03/03/2023    Patient will increase FOTO score to equal to or greater than  68   to demonstrate statistically significant improvement in mobility and quality of life.  Baseline: 63  10/29: 74 Goal status: MET  2.  Patient (> 32 years old) will complete five times sit to stand test in < 15  seconds without use of BUE indicating an increased LE strength and improved balance. Baseline: 15.82 with UE support  10/29: 13.84 with UE support push from seat  Goal status: MET; revised   3.  Patient will increase Berg Balance score by > 6 points to demonstrate decreased fall risk during functional activities Baseline: 46 10/29: 49  Goal status: IN PROGRESS  4.  Patient will increase 6 min walk test to >1260ft as to improve gait speed for better community ambulation and to reduce fall risk. Baseline: 950 10/29 1130ft  Goal status: MET ; revised    5.  Patient will reduce timed up and go to <11 seconds to reduce fall risk and demonstrate improved transfer/gait ability. Baseline: 15.72 10/29: 9.97sec  Goal status: MET  6.  Patient will increase FGA score to >22 as to demonstrate reduced fall risk and improved dynamic gait balance for better safety with community/home ambulation.   Baseline:19 10/29 24 Goal status: MET   ASSESSMENT:  CLINICAL IMPRESSION: Pt put forth excellent effort for PT treatment to improve BUE and BLE strength. STM for pain relief and muscle extensibility in L side paraspinals with reported relief upon completion.  Pt will continue to benefit from skilled PT to address balance, strength and ROM deficits, improve safety and QoL while continuing CA treatment.       OBJECTIVE IMPAIRMENTS: Abnormal gait, cardiopulmonary status limiting activity, decreased activity tolerance, decreased balance, decreased endurance, decreased knowledge of condition, decreased mobility, difficulty walking, decreased ROM, decreased strength, hypomobility, increased fascial restrictions, impaired perceived functional ability, impaired flexibility, impaired UE functional use, improper body mechanics, postural dysfunction, and pain.   ACTIVITY LIMITATIONS: carrying, lifting, bending, standing, squatting, sleeping, stairs, transfers, bathing, toileting, dressing, reach over head, and locomotion level  PARTICIPATION LIMITATIONS: cleaning, laundry, driving, shopping, community activity, and yard work  PERSONAL FACTORS: Age, Behavior pattern, Fitness, Past/current experiences, and 1-2 comorbidities: CA with profound metastases   are also affecting patient's functional outcome.   REHAB POTENTIAL: Good  CLINICAL DECISION MAKING: Evolving/moderate complexity  EVALUATION COMPLEXITY: Moderate  PLAN:  PT FREQUENCY: 1-2x/week  PT DURATION: 12 weeks  PLANNED INTERVENTIONS: Therapeutic exercises, Therapeutic activity, Neuromuscular  re-education, Balance training, Gait training, Patient/Family education, Self Care, Joint mobilization, Stair training, DME instructions, Cryotherapy, Moist heat, and Manual therapy  PLAN FOR NEXT SESSION:   Continue BLE strengthening and balance training.  UE and cervical ROM as tolerated.    Golden Pop, PT 02/01/2023, 11:49 AM

## 2023-02-03 ENCOUNTER — Ambulatory Visit: Payer: Commercial Managed Care - PPO | Admitting: Physical Therapy

## 2023-02-03 ENCOUNTER — Ambulatory Visit: Payer: Medicaid Other | Admitting: Physical Therapy

## 2023-02-03 MED FILL — Fosaprepitant Dimeglumine For IV Infusion 150 MG (Base Eq): INTRAVENOUS | Qty: 5 | Status: AC

## 2023-02-04 ENCOUNTER — Inpatient Hospital Stay: Payer: Medicaid Other

## 2023-02-04 ENCOUNTER — Encounter: Payer: Self-pay | Admitting: Hematology and Oncology

## 2023-02-04 ENCOUNTER — Inpatient Hospital Stay: Payer: Medicaid Other | Admitting: Hematology and Oncology

## 2023-02-04 ENCOUNTER — Inpatient Hospital Stay: Payer: Medicaid Other | Attending: Hematology and Oncology

## 2023-02-04 VITALS — BP 124/85 | HR 120 | Temp 98.4°F | Resp 18 | Ht 70.0 in | Wt 145.7 lb

## 2023-02-04 VITALS — HR 113

## 2023-02-04 DIAGNOSIS — C7951 Secondary malignant neoplasm of bone: Secondary | ICD-10-CM | POA: Diagnosis present

## 2023-02-04 DIAGNOSIS — Z170 Estrogen receptor positive status [ER+]: Secondary | ICD-10-CM | POA: Insufficient documentation

## 2023-02-04 DIAGNOSIS — Z79811 Long term (current) use of aromatase inhibitors: Secondary | ICD-10-CM | POA: Diagnosis not present

## 2023-02-04 DIAGNOSIS — Z79899 Other long term (current) drug therapy: Secondary | ICD-10-CM | POA: Diagnosis not present

## 2023-02-04 DIAGNOSIS — Z923 Personal history of irradiation: Secondary | ICD-10-CM | POA: Insufficient documentation

## 2023-02-04 DIAGNOSIS — Z9012 Acquired absence of left breast and nipple: Secondary | ICD-10-CM | POA: Diagnosis not present

## 2023-02-04 DIAGNOSIS — Z5112 Encounter for antineoplastic immunotherapy: Secondary | ICD-10-CM | POA: Diagnosis present

## 2023-02-04 DIAGNOSIS — Z9221 Personal history of antineoplastic chemotherapy: Secondary | ICD-10-CM | POA: Insufficient documentation

## 2023-02-04 DIAGNOSIS — C50512 Malignant neoplasm of lower-outer quadrant of left female breast: Secondary | ICD-10-CM | POA: Insufficient documentation

## 2023-02-04 LAB — CBC WITH DIFFERENTIAL (CANCER CENTER ONLY)
Abs Immature Granulocytes: 0.01 10*3/uL (ref 0.00–0.07)
Basophils Absolute: 0 10*3/uL (ref 0.0–0.1)
Basophils Relative: 1 %
Eosinophils Absolute: 0.1 10*3/uL (ref 0.0–0.5)
Eosinophils Relative: 3 %
HCT: 40.6 % (ref 36.0–46.0)
Hemoglobin: 13.1 g/dL (ref 12.0–15.0)
Immature Granulocytes: 0 %
Lymphocytes Relative: 13 %
Lymphs Abs: 0.4 10*3/uL — ABNORMAL LOW (ref 0.7–4.0)
MCH: 35.1 pg — ABNORMAL HIGH (ref 26.0–34.0)
MCHC: 32.3 g/dL (ref 30.0–36.0)
MCV: 108.8 fL — ABNORMAL HIGH (ref 80.0–100.0)
Monocytes Absolute: 0.4 10*3/uL (ref 0.1–1.0)
Monocytes Relative: 13 %
Neutro Abs: 2.3 10*3/uL (ref 1.7–7.7)
Neutrophils Relative %: 70 %
Platelet Count: 293 10*3/uL (ref 150–400)
RBC: 3.73 MIL/uL — ABNORMAL LOW (ref 3.87–5.11)
RDW: 16.8 % — ABNORMAL HIGH (ref 11.5–15.5)
WBC Count: 3.3 10*3/uL — ABNORMAL LOW (ref 4.0–10.5)
nRBC: 0 % (ref 0.0–0.2)

## 2023-02-04 LAB — CMP (CANCER CENTER ONLY)
ALT: 9 U/L (ref 0–44)
AST: 21 U/L (ref 15–41)
Albumin: 3.9 g/dL (ref 3.5–5.0)
Alkaline Phosphatase: 125 U/L (ref 38–126)
Anion gap: 6 (ref 5–15)
BUN: 11 mg/dL (ref 8–23)
CO2: 26 mmol/L (ref 22–32)
Calcium: 9.7 mg/dL (ref 8.9–10.3)
Chloride: 106 mmol/L (ref 98–111)
Creatinine: 0.56 mg/dL (ref 0.44–1.00)
GFR, Estimated: >60 mL/min (ref >60)
Glucose, Bld: 93 mg/dL (ref 70–99)
Potassium: 3.9 mmol/L (ref 3.5–5.1)
Sodium: 138 mmol/L (ref 135–145)
Total Bilirubin: 0.3 mg/dL (ref <1.2)
Total Protein: 7 g/dL (ref 6.5–8.1)

## 2023-02-04 MED ORDER — DEXAMETHASONE SODIUM PHOSPHATE 10 MG/ML IJ SOLN
10.0000 mg | Freq: Once | INTRAMUSCULAR | Status: AC
  Administered 2023-02-04: 10 mg via INTRAVENOUS
  Filled 2023-02-04: qty 1

## 2023-02-04 MED ORDER — ACETAMINOPHEN 325 MG PO TABS
650.0000 mg | ORAL_TABLET | Freq: Once | ORAL | Status: AC
  Administered 2023-02-04: 650 mg via ORAL
  Filled 2023-02-04: qty 2

## 2023-02-04 MED ORDER — DIPHENHYDRAMINE HCL 25 MG PO CAPS
50.0000 mg | ORAL_CAPSULE | Freq: Once | ORAL | Status: AC
  Administered 2023-02-04: 50 mg via ORAL
  Filled 2023-02-04: qty 2

## 2023-02-04 MED ORDER — PALONOSETRON HCL INJECTION 0.25 MG/5ML
0.2500 mg | Freq: Once | INTRAVENOUS | Status: AC
  Administered 2023-02-04: 0.25 mg via INTRAVENOUS
  Filled 2023-02-04: qty 5

## 2023-02-04 MED ORDER — DEXTROSE 5 % IV SOLN: Freq: Once | INTRAVENOUS | Status: AC

## 2023-02-04 MED ORDER — FAM-TRASTUZUMAB DERUXTECAN-NXKI CHEMO 100 MG IV SOLR
3.2000 mg/kg | Freq: Once | INTRAVENOUS | Status: AC
  Administered 2023-02-04: 200 mg via INTRAVENOUS
  Filled 2023-02-04: qty 10

## 2023-02-04 MED ORDER — SODIUM CHLORIDE 0.9 % IV SOLN
150.0000 mg | Freq: Once | INTRAVENOUS | Status: AC
  Administered 2023-02-04: 150 mg via INTRAVENOUS
  Filled 2023-02-04: qty 150

## 2023-02-04 NOTE — Progress Notes (Signed)
Patient Care Team: Ollen Bowl, MD as PCP - General (Internal Medicine) [name], MD as Consulting Physician (Hematology and Oncology) Almond Lint, MD as Consulting Physician (General Surgery) Dorothy Puffer, MD as Consulting Physician (Radiation Oncology) Carmina Miller, MD as Consulting Physician (Radiation Oncology)  DIAGNOSIS:  Encounter Diagnosis  Name Primary?   Malignant neoplasm of lower-outer quadrant of left breast of female, estrogen receptor positive (HCC) Yes    SUMMARY OF ONCOLOGIC HISTORY: Oncology History  Malignant neoplasm of lower-outer quadrant of left breast of female, estrogen receptor positive (HCC)  06/11/2016 Mammogram   Palpable left breast masses 3:00 position: 2.2 cm; 5:30 position: 2.5 cm; 6:30 position: 0.7 cm   06/19/2016 Initial Diagnosis   Left breast biopsy 3:30: IDC with DCIS grade 1, ER 90%, PR 50%, Ki-67 15%, HER-2 negative ratio 1.13; biopsy 5:30 position: IDC grade 1   07/13/2016 Breast MRI   Large area of abnormal enhancement lower inner and lower outer quadrants left breast spanning 9 cm x 6.4 cm x 5.3 cm, no abnormal enlarged lymph nodes; T3 N0 stage II a (New AJCC staging)    07/15/2016 - 12/08/2017 Anti-estrogen oral therapy   Neoadjuvant anastrozole 1 mg daily   07/17/2016 Oncotype testing   Testing done on the biopsy: Oncotype DX score 22, intermediate risk   02/02/2017 Breast MRI   Left breast multicentric disease unchanged measuring 2.7 x 1.6 cm.  Mass in the non-mass enhancement are also not significantly changed measuring 6.2 x 2.4 cm. new enhancing mass within the outer right breast 7 mm which could be fat necrosis or inclusion cyst    02/09/2017 Imaging   Ultrasound of the right breast lesion noted on MRI: No sonographic finding corresponds to the abnormality noted on MRI   07/13/2017 Cancer Staging   Staging form: Breast, AJCC 8th Edition - Clinical stage from 07/13/2017: Stage IIA (cT3, cN0, cM0, G1, ER+, PR+, HER2-) -  Signed by Loa Socks, NP on 05/18/2018   10/26/2017 Surgery   Left mastectomy: IDC grade 1, 2 foci largest spans 8.5 cm, intermediate grade DCIS, lymphovascular invasion identified, perineural invasion identified, 1/2 lymph nodes positive with extracapsular extension, ER 9200%, PR 5 to 50%, HER-2 negative, Ki-67 10 to 15%, T3N1A Mammaprint: low risk   11/02/2017 Cancer Staging   Staging form: Breast, AJCC 8th Edition - Pathologic: No Stage Recommended (ypT3, pN1a, cM0, G1, ER+, PR+, HER2-) - Signed by [name], MD on 11/02/2017   12/08/2017 - 01/26/2018 Radiation Therapy   Adjuvant radiation therapy    02/2018 -  Anti-estrogen oral therapy   Anastrozole 1 mg daily adjuvant therapy   10/21/2022 -  Chemotherapy   Patient is on Treatment Plan : BREAST METASTATIC Fam-Trastuzumab Deruxtecan-nxki (Enhertu) (5.4) q21d       CHIEF COMPLIANT: Cycle 6 Enhertu  HISTORY OF PRESENT ILLNESS: History of Present Illness   The patient, with a history of met6 breast cancer, presents with new skin lesions on the shoulder and groin. She reports no pain or nausea. She has noticed hair thinning and is considering a wig. Despite these issues, she maintains a positive attitude and continues to be active, driving and shopping. She has also gained weight, thanks to a dietitian friend's advice on healthy snacks.      ALLERGIES:  is allergic to percocet [oxycodone-acetaminophen].  MEDICATIONS:  Current Outpatient Medications  Medication Sig Dispense Refill   azelastine (ASTELIN) 0.1 % nasal spray Place into both nostrils 2 (two) times daily. (Patient not taking:  Reported on 11/03/2022)     Calcium 500-100 MG-UNIT CHEW Chew 1 tablet by mouth daily. 60 tablet    cetirizine (ZYRTEC) 10 MG tablet Take by mouth. (Patient not taking: Reported on 11/03/2022)     cholecalciferol (VITAMIN D3) 25 MCG (1000 UNIT) tablet Take 1 tablet (1,000 Units total) by mouth daily. (Patient not taking: Reported on  11/03/2022)     dexamethasone (DECADRON) 4 MG tablet Take 1 tab for 2 days after chemo (Patient not taking: Reported on 12/28/2022) 30 tablet 1   furosemide (LASIX) 20 MG tablet Take 1 tablet (20 mg total) by mouth daily. If needed may take 1/2 tab q am 30 tablet 0   levothyroxine (SYNTHROID) 112 MCG tablet Take 1 tablet by mouth daily.     LORazepam (ATIVAN) 0.5 MG tablet Take 1 tablet (0.5 mg total) by mouth at bedtime. (Patient not taking: Reported on 11/03/2022) 30 tablet 0   omeprazole (PRILOSEC) 20 MG capsule Take 1 capsule (20 mg total) by mouth daily. 30 capsule 3   ondansetron (ZOFRAN) 8 MG tablet Take 1 tablet (8 mg total) by mouth every 8 (eight) hours as needed for nausea or vomiting. Start on the third day after chemotherapy. (Patient not taking: Reported on 11/03/2022) 30 tablet 1   pantoprazole (PROTONIX) 40 MG tablet 1 tablet Orally Once a day for 30 days Take 1 tablet twice daily for 2 weeks then take 1 tablet daily. (Patient not taking: Reported on 12/28/2022)     prochlorperazine (COMPAZINE) 10 MG tablet Take 1 tablet (10 mg total) by mouth every 6 (six) hours as needed for nausea or vomiting. (Patient not taking: Reported on 11/03/2022) 30 tablet 1   sucralfate (CARAFATE) 1 g tablet TAKE 1 TABLET BY MOUTH 4 TIMES DAILY WITH MEALS AND AT BEDTIME. DISSOLVE IN WARM WATER, SWISH AND SWALLOW. 120 tablet 1   vitamin C (ASCORBIC ACID) 250 MG tablet Take 1 tablet (250 mg total) by mouth daily.     No current facility-administered medications for this visit.    PHYSICAL EXAMINATION: ECOG PERFORMANCE STATUS: 1 - Symptomatic but completely ambulatory  Vitals:   02/04/23 1335  BP: 124/85  Pulse: (!) 120  Resp: 18  Temp: 98.4 F (36.9 C)  SpO2: 99%   Filed Weights   02/04/23 1335  Weight: 145 lb 11.2 oz (66.1 kg)    Physical Exam   HEENT: Reports of hair thinning. SKIN: Possible hair folliculitis noted.      (exam performed in the presence of a chaperone)  LABORATORY DATA:   I have reviewed the data as listed    Latest Ref Rng & Units 02/04/2023    1:17 PM 01/14/2023    1:21 PM 12/24/2022    2:46 PM  CMP  Glucose 70 - 99 mg/dL 93  409  85   BUN 8 - 23 mg/dL 11  8  5    Creatinine 0.44 - 1.00 mg/dL 8.11  9.14  7.82   Sodium 135 - 145 mmol/L 138  137  137   Potassium 3.5 - 5.1 mmol/L 3.9  4.2  3.7   Chloride 98 - 111 mmol/L 106  106  105   CO2 22 - 32 mmol/L 26  26  26    Calcium 8.9 - 10.3 mg/dL 9.7  9.0  9.3   Total Protein 6.5 - 8.1 g/dL 7.0  6.4  6.4   Total Bilirubin <1.2 mg/dL 0.3  0.3  0.3   Alkaline Phos 38 - 126 U/L  125  127  133   AST 15 - 41 U/L 21  19  22    ALT 0 - 44 U/L 9  8  10      Lab Results  Component Value Date   WBC 3.3 (L) 02/04/2023   HGB 13.1 02/04/2023   HCT 40.6 02/04/2023   MCV 108.8 (H) 02/04/2023   PLT 293 02/04/2023   NEUTROABS 2.3 02/04/2023    ASSESSMENT & PLAN:  Malignant neoplasm of lower-outer quadrant of left breast of female, estrogen receptor positive (HCC) 10/26/17: Left mastectomy: IDC grade 1, 2 foci largest spans 8.5 cm, intermediate grade DCIS, lymphovascular invasion identified, perineural invasion identified, 1/2 lymph nodes positive with extracapsular extension, ER 9200%, PR 5 to 50%, HER-2 negative, Ki-67 10 to 15%, T3N1A   Oncotype DX score 22, intermediate risk, chemotherapy not felt to have significant benefit.   Treatment Summary: 1. Antiestrogen therapy with anastrozole 1 mg daily started 07/15/2016 2. Mastectomy 10/26/2017, Mammaprint low risk luminal type A 3. Followed by adjuvant radiation 12/08/17- 01/26/18  4. Followed by adjuvant antiestrogen therapy anastrozole started 01/17/2018 (originally started 07/15/2016) -------------------------------------------------------------------- Low back pain August 2023: Underwent CT myelogram: Large expansile lesion in the sacrum with extraosseous extension of the tumor, diffuse lytic lesions throughout the visualized spine with metastatic disease myeloma is  considered less likely.  (This was ordered by Dr. Marcene Corning)   Treatment plan: 1.  PET CT scan 12/27/2021: Widespread bone metastatic disease largest lesion involving the sacrum with pathological fractures of T6 and T10 retroperitoneal and pelvic lymph node metastasis, right axillary lymph node, activity in the pancreatic head, hypermetabolic activity in the adrenal glands, right thyroid nodule. 2. biopsy of sacrum: 12/26/2021: Metastatic breast cancer, ER 90%, PR 10%, HER2 negative (0) 3.  Ibrance along with Faslodex started 12/25/2021-10/14/2022 4.  Xgeva for bone metastases.  Every 3 months 5.  Palliative radiation to the sacrum completed 01/19/2022 ---------------------------------------------------------------------------------------------------------------------- Current treatment: Enhertu started 10/21/2022, today is cycle 6 We plan to continue this treatment irrespective of her blood counts.   Enhertu toxicities: Tolerated extremely well without any problems. Completed radiation   Liver function tests have started to improve.   PET/CT 10/29/2022: Overall improved appearance of bone lesions.  Stable right extra lymph node, retroperitoneal lymph nodes decreased activity CT head ordered by Dr. 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  Unchanged innumerable scalp nodules   Return to clinic every 3 weeks for Enhertu treatment. We will perform CT CAP prior to next cycle.  No orders of the defined types were placed in this encounter.  The patient has a good understanding of the overall plan. she agrees with it. she will call with any problems that may develop before the next visit here. Total time spent: 30 mins including face to face time and time spent for planning, charting and co-ordination of care   Tamsen Meek,  MD 02/04/23

## 2023-02-04 NOTE — Assessment & Plan Note (Signed)
10/26/17: Left mastectomy: IDC grade 1, 2 foci largest spans 8.5 cm, intermediate grade DCIS, lymphovascular invasion identified, perineural invasion identified, 1/2 lymph nodes positive with extracapsular extension, ER 9200%, PR 5 to 50%, HER-2 negative, Ki-67 10 to 15%, T3N1A   Oncotype DX score 22, intermediate risk, chemotherapy not felt to have significant benefit.   Treatment Summary: 1. Antiestrogen therapy with anastrozole 1 mg daily started 07/15/2016 2. Mastectomy 10/26/2017, Mammaprint low risk luminal type A 3. Followed by adjuvant radiation 12/08/17- 01/26/18  4. Followed by adjuvant antiestrogen therapy anastrozole started 01/17/2018 (originally started 07/15/2016) -------------------------------------------------------------------- Low back pain August 2023: Underwent CT myelogram: Large expansile lesion in the sacrum with extraosseous extension of the tumor, diffuse lytic lesions throughout the visualized spine with metastatic disease myeloma is considered less likely.  (This was ordered by Dr. Marcene Corning)   Treatment plan: 1.  PET CT scan 12/27/2021: Widespread bone metastatic disease largest lesion involving the sacrum with pathological fractures of T6 and T10 retroperitoneal and pelvic lymph node metastasis, right axillary lymph node, activity in the pancreatic head, hypermetabolic activity in the adrenal glands, right thyroid nodule. 2. biopsy of sacrum: 12/26/2021: Metastatic breast cancer, ER 90%, PR 10%, HER2 negative (0) 3.  Ibrance along with Faslodex started 12/25/2021-10/14/2022 4.  Xgeva for bone metastases.  Every 3 months 5.  Palliative radiation to the sacrum completed 01/19/2022 ---------------------------------------------------------------------------------------------------------------------- Current treatment: Enhertu started 10/21/2022, today is cycle 6 We plan to continue this treatment irrespective of her blood counts.   Enhertu toxicities: Tolerated extremely  well without any problems. Completed radiation   Liver function tests have started to improve.   PET/CT 10/29/2022: Overall improved appearance of bone lesions.  Stable right extra lymph node, retroperitoneal lymph nodes decreased activity CT head ordered by Dr. 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  Unchanged innumerable scalp nodules   Return to clinic every 3 weeks for Enhertu treatment. We will perform CT CAP prior to next cycle.

## 2023-02-04 NOTE — Patient Instructions (Signed)
[facility] - A DEPT OF [facility]  Discharge Instructions: Thank you for choosing [facility] to provide your oncology and hematology care.   If you have a lab appointment with the Cancer Center, please go directly to the Cancer Center and check in at the registration area.   Wear comfortable clothing and clothing appropriate for easy access to any Portacath or PICC line.   We strive to give you quality time with your provider. You may need to reschedule your appointment if you arrive late (15 or more minutes).  Arriving late affects you and other patients whose appointments are after yours.  Also, if you miss three or more appointments without notifying the office, you may be dismissed from the clinic at the provider's discretion.      For prescription refill requests, have your pharmacy contact our office and allow 72 hours for refills to be completed.    Today you received the following chemotherapy and/or immunotherapy agents: fam-trastuzumab deruxtecan-nxki      To help prevent nausea and vomiting after your treatment, we encourage you to take your nausea medication as directed.  BELOW ARE SYMPTOMS THAT SHOULD BE REPORTED IMMEDIATELY: *FEVER GREATER THAN 100.4 F (38 C) OR HIGHER *CHILLS OR SWEATING *NAUSEA AND VOMITING THAT IS NOT CONTROLLED WITH YOUR NAUSEA MEDICATION *UNUSUAL SHORTNESS OF BREATH *UNUSUAL BRUISING OR BLEEDING *URINARY PROBLEMS (pain or burning when urinating, or frequent urination) *BOWEL PROBLEMS (unusual diarrhea, constipation, pain near the anus) TENDERNESS IN MOUTH AND THROAT WITH OR WITHOUT PRESENCE OF ULCERS (sore throat, sores in mouth, or a toothache) UNUSUAL RASH, SWELLING OR PAIN  UNUSUAL VAGINAL DISCHARGE OR ITCHING   Items with * indicate a potential emergency and should be followed up as soon as possible or go to the Emergency Department if any problems should occur.  Please show the CHEMOTHERAPY ALERT  CARD or IMMUNOTHERAPY ALERT CARD at check-in to the Emergency Department and triage nurse.  Should you have questions after your visit or need to cancel or reschedule your appointment, please contact [facility] - A DEPT OF [facility]  Dept: [phone]  and follow the prompts.  Office hours are 8:00 a.m. to 4:30 p.m. Monday - Friday. Please note that voicemails left after 4:00 p.m. may not be returned until the following business day.  We are closed weekends and major holidays. You have access to a nurse at all times for urgent questions. Please call the main number to the clinic Dept: [phone] and follow the prompts.   For any non-urgent questions, you may also contact your provider using MyChart. We now offer e-Visits for anyone 13 and older to request care online for non-urgent symptoms. For details visit [email or web address].   Also download the MyChart app! Go to the app store, search "MyChart", open the app, select [facility], and log in with your MyChart username and password.

## 2023-02-07 IMAGING — US US BREAST*R* LIMITED INC AXILLA
1 series · 5 of 5 positions shown · non-contrast
Comparison: Previous exam(s).

CLINICAL DATA: 60-year-old female recalled from screening mammogram
dated 12/10/2020 for a possible right breast mass.

EXAM:
DIGITAL DIAGNOSTIC UNILATERAL RIGHT MAMMOGRAM WITH TOMOSYNTHESIS AND
CAD; ULTRASOUND RIGHT BREAST LIMITED
TECHNIQUE: Right digital diagnostic mammography and breast tomosynthesis was
performed. The images were evaluated with computer-aided detection.;
Targeted ultrasound examination of the right breast was performed

[Series 1: us breast*right* limited inc axilla · 0.07mm/px · 5 of 5 slices shown]
[im 1/5]
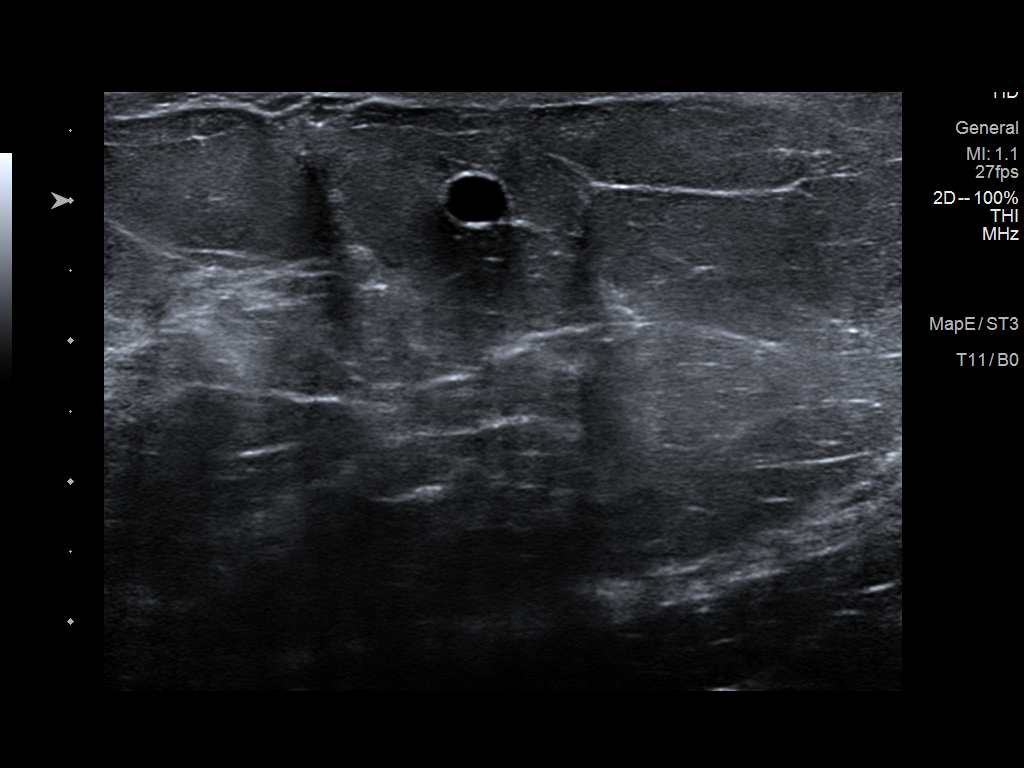
[im 2/5]
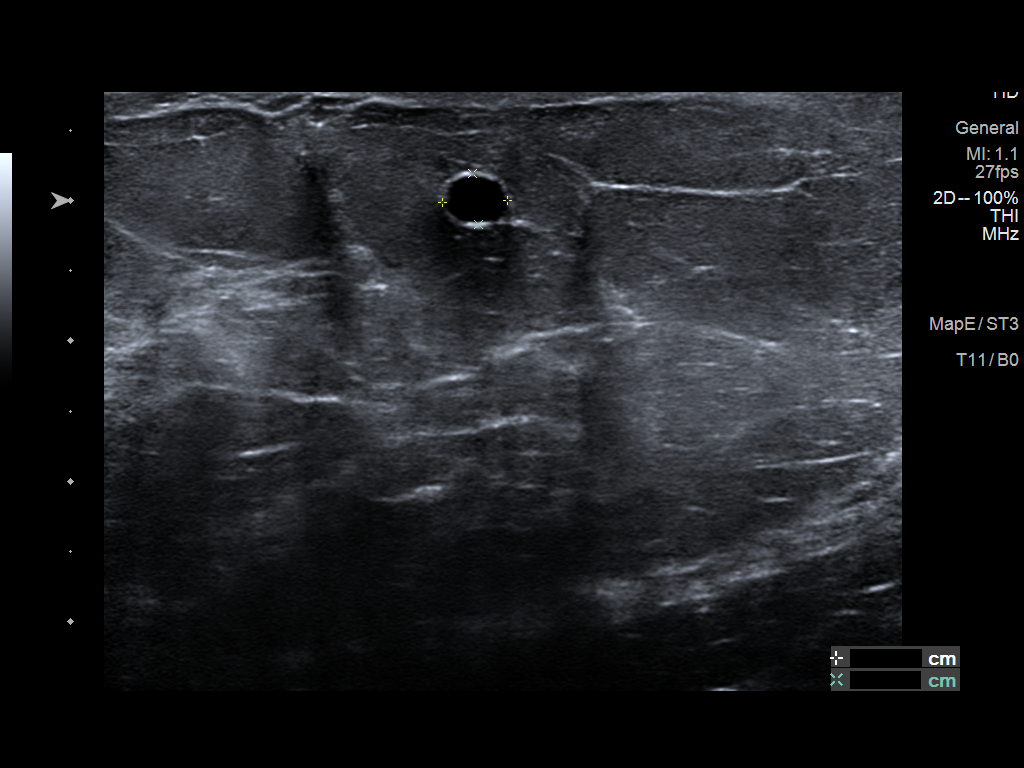
[im 3/5]
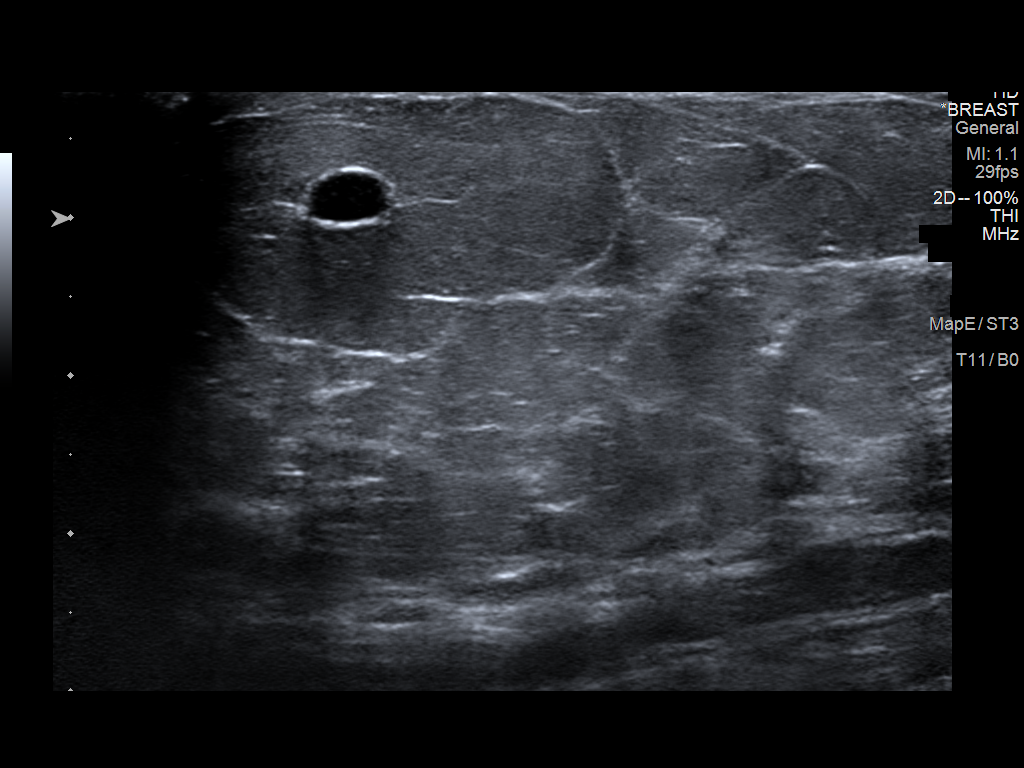
[im 4/5]
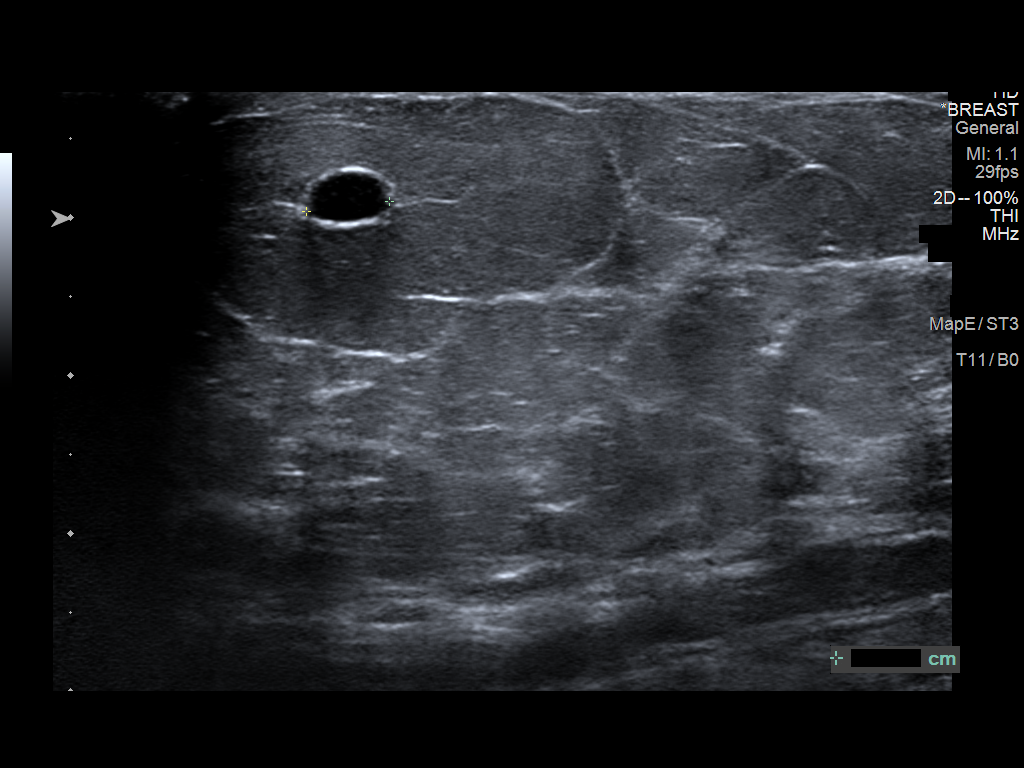
[im 5/5]
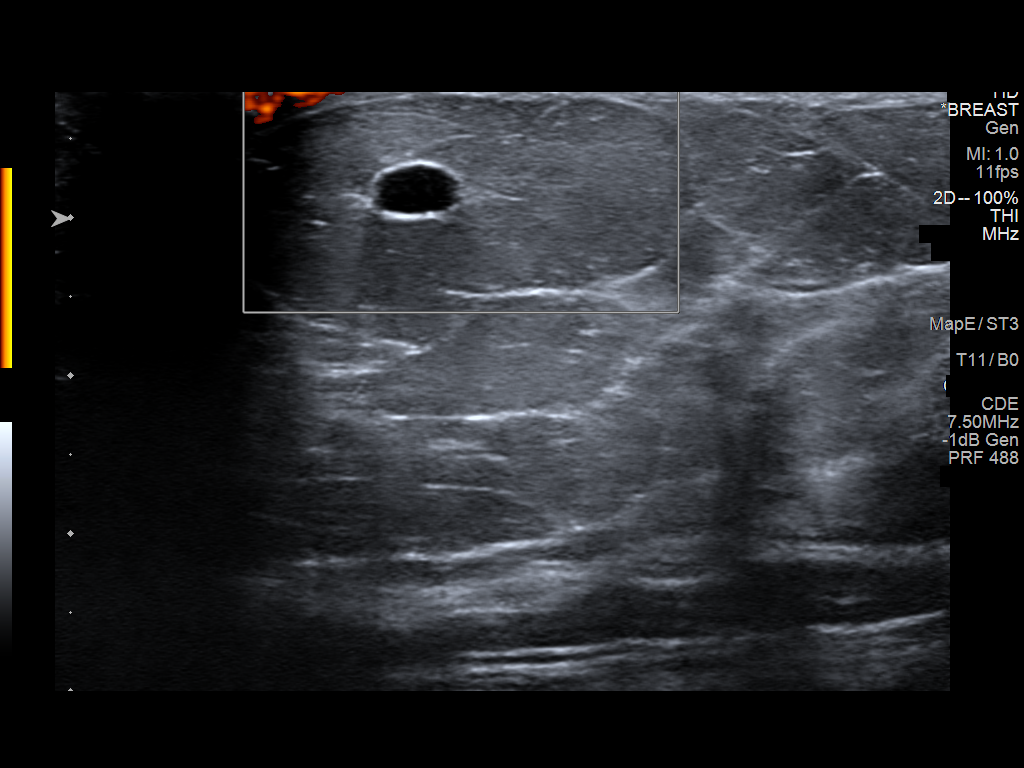

[5 of 5 positions shown; findings below may reference images not displayed]

ACR Breast Density Category b: There are scattered areas of
fibroglandular density.
FINDINGS: There is a persistent oval, circumscribed equal density mass in the
slightly medial subareolar right breast. This is adjacent to an
additional smaller cysts oil cyst. Further evaluation with
ultrasound is performed.

Targeted ultrasound is performed, showing an oval, circumscribed
anechoic mass at the 3 o'clock retroareolar position. It measures 5
x 5 x 4 mm. There is no internal vascularity. This correlates well
with the mammographic finding.
IMPRESSION: Benign right breast cyst corresponding with the screening
mammographic findings. No further imaging follow-up required.

RECOMMENDATION:
Screening mammogram in one year.(Code:V3-N-475)

I have discussed the findings and recommendations with the patient.
If applicable, a reminder letter will be sent to the patient
regarding the next appointment.

BI-RADS CATEGORY  2: Benign.

## 2023-02-07 IMAGING — MG MM DIGITAL DIAGNOSTIC UNILAT*R* W/ TOMO W/ CAD
4 series · 4 of 12 positions shown · non-contrast
Comparison: Previous exam(s).

CLINICAL DATA: 60-year-old female recalled from screening mammogram
dated 12/10/2020 for a possible right breast mass.

EXAM:
DIGITAL DIAGNOSTIC UNILATERAL RIGHT MAMMOGRAM WITH TOMOSYNTHESIS AND
CAD; ULTRASOUND RIGHT BREAST LIMITED
TECHNIQUE: Right digital diagnostic mammography and breast tomosynthesis was
performed. The images were evaluated with computer-aided detection.;
Targeted ultrasound examination of the right breast was performed

[R ML synth-2D]
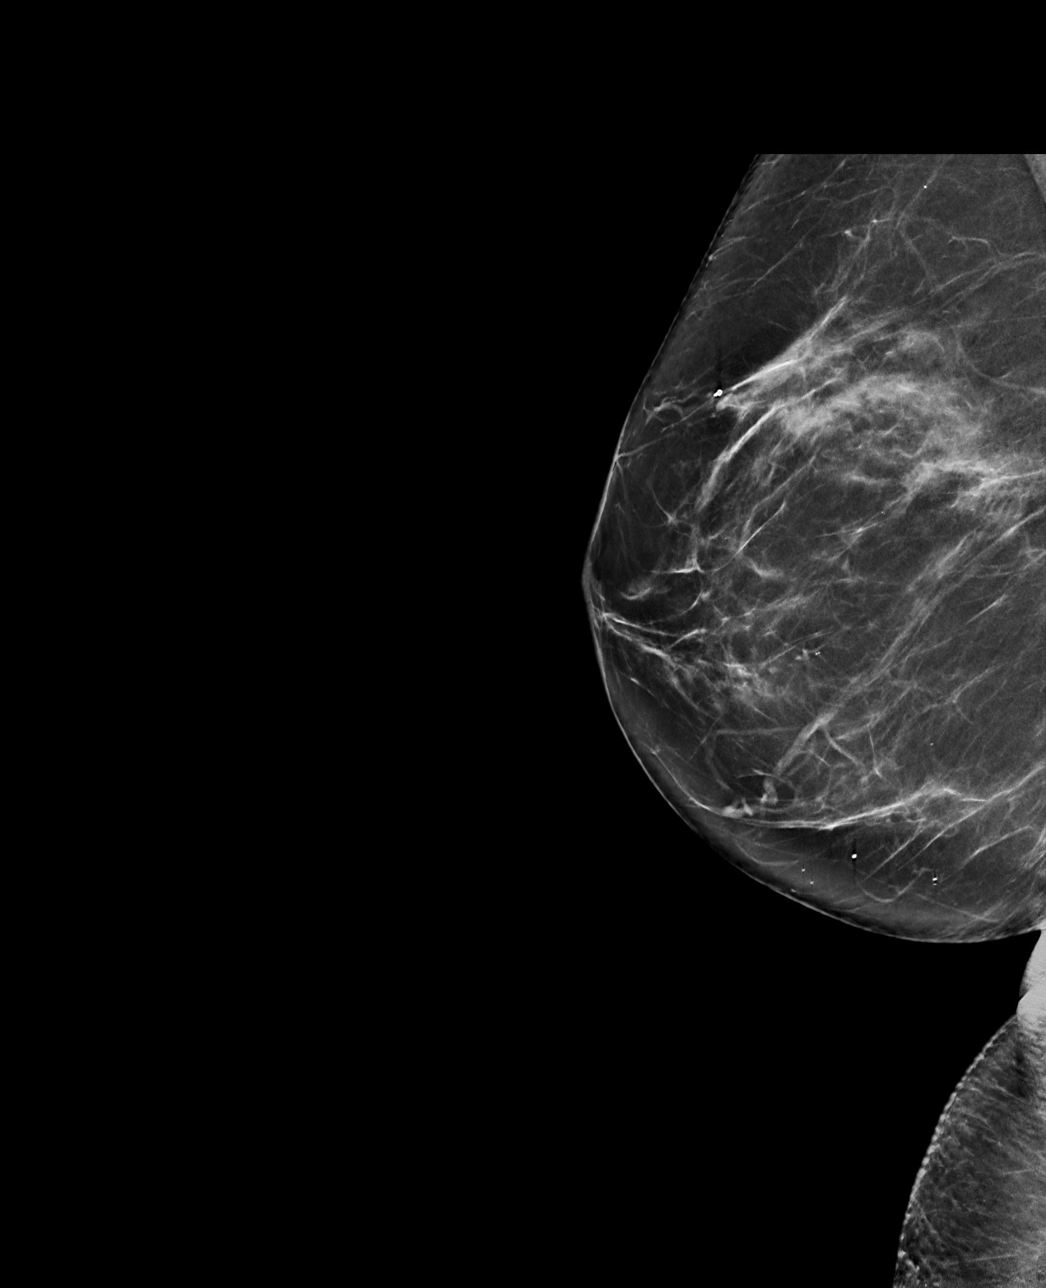

[R CC synth-2D]
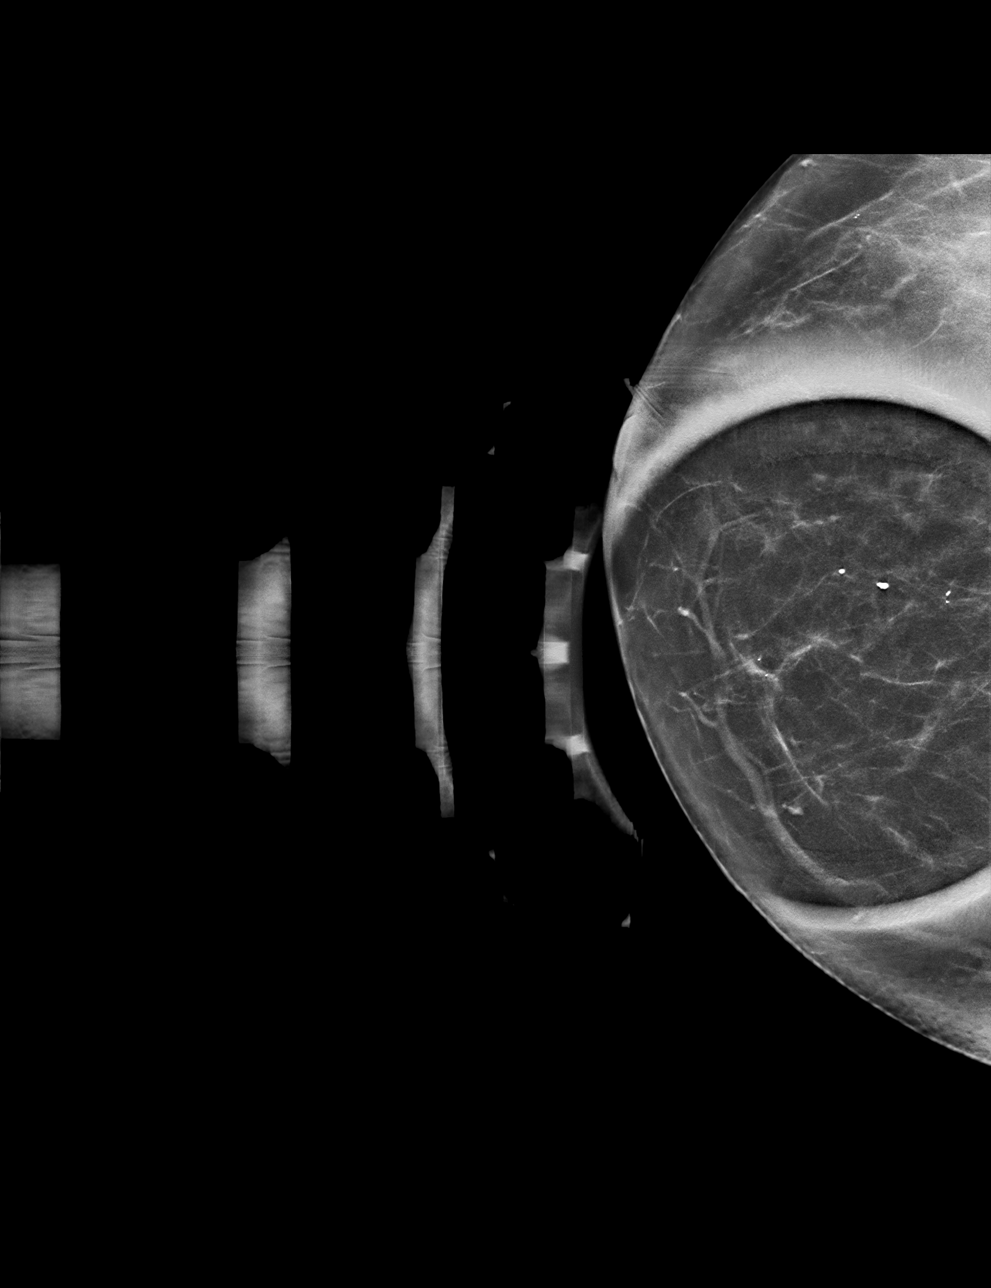

[R ML tomo · tomo slice 39/78.0]
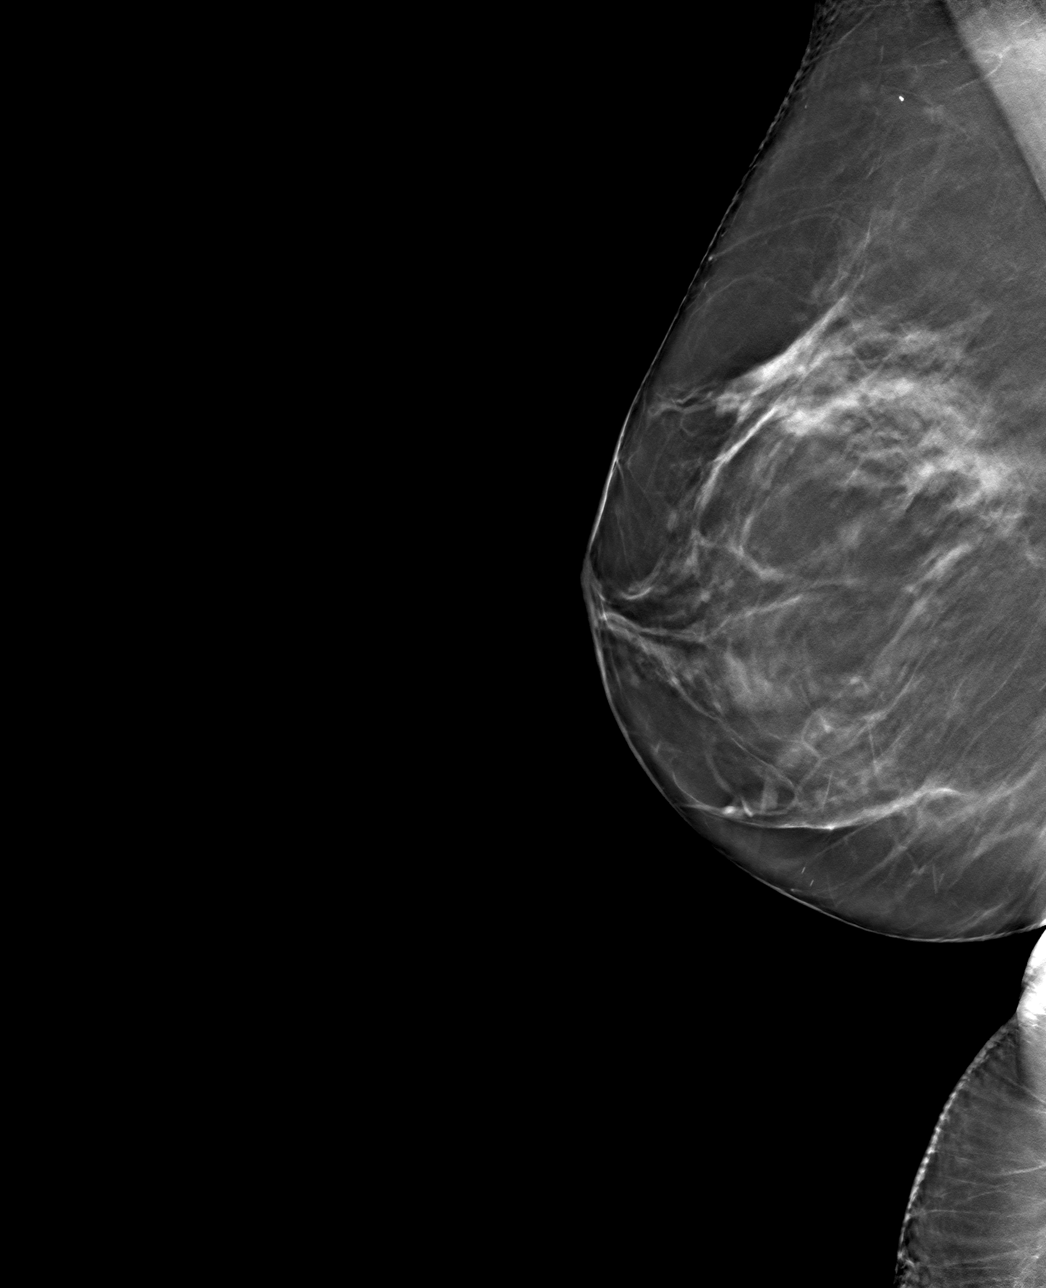

[R CC tomo · tomo slice 33/64.0]
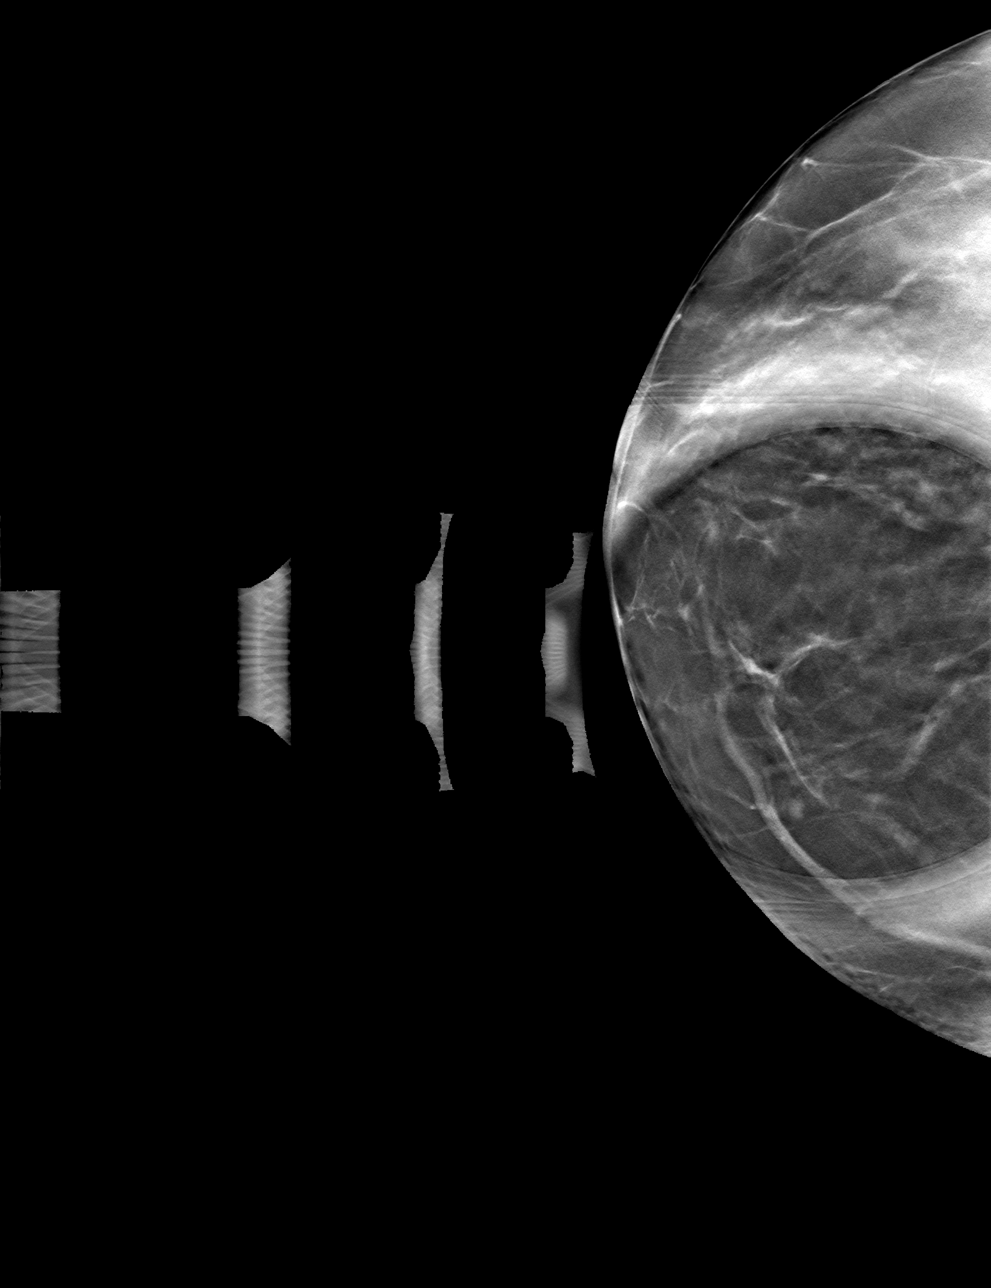

[4 of 12 positions shown; findings below may reference images not displayed]

ACR Breast Density Category b: There are scattered areas of
fibroglandular density.
FINDINGS: There is a persistent oval, circumscribed equal density mass in the
slightly medial subareolar right breast. This is adjacent to an
additional smaller cysts oil cyst. Further evaluation with
ultrasound is performed.

Targeted ultrasound is performed, showing an oval, circumscribed
anechoic mass at the 3 o'clock retroareolar position. It measures 5
x 5 x 4 mm. There is no internal vascularity. This correlates well
with the mammographic finding.
IMPRESSION: Benign right breast cyst corresponding with the screening
mammographic findings. No further imaging follow-up required.

RECOMMENDATION:
Screening mammogram in one year.(Code:V3-N-475)

I have discussed the findings and recommendations with the patient.
If applicable, a reminder letter will be sent to the patient
regarding the next appointment.

BI-RADS CATEGORY  2: Benign.

## 2023-02-08 ENCOUNTER — Ambulatory Visit: Payer: Medicaid Other | Admitting: Physical Therapy

## 2023-02-10 ENCOUNTER — Ambulatory Visit: Payer: Medicaid Other | Admitting: Physical Therapy

## 2023-02-10 DIAGNOSIS — M25611 Stiffness of right shoulder, not elsewhere classified: Secondary | ICD-10-CM

## 2023-02-10 DIAGNOSIS — R262 Difficulty in walking, not elsewhere classified: Secondary | ICD-10-CM

## 2023-02-10 DIAGNOSIS — R29898 Other symptoms and signs involving the musculoskeletal system: Secondary | ICD-10-CM

## 2023-02-10 DIAGNOSIS — M6281 Muscle weakness (generalized): Secondary | ICD-10-CM

## 2023-02-10 DIAGNOSIS — M436 Torticollis: Secondary | ICD-10-CM

## 2023-02-10 DIAGNOSIS — M25612 Stiffness of left shoulder, not elsewhere classified: Secondary | ICD-10-CM

## 2023-02-10 DIAGNOSIS — M542 Cervicalgia: Secondary | ICD-10-CM

## 2023-02-10 DIAGNOSIS — M25562 Pain in left knee: Secondary | ICD-10-CM

## 2023-02-10 NOTE — Therapy (Signed)
OUTPATIENT PHYSICAL THERAPY    Patient Name: Kathryn Lucas MRN: [id number] DOB:1960/10/29, 62 y.o., female Today's Date: 02/10/2023   PCP: Ollen Bowl, MD  REFERRING PROVIDER: [name], MD   END OF SESSION:  PT End of Session - 02/10/23 1329     Visit Number 16    Number of Visits 24    Date for PT Re-Evaluation 03/01/23    Progress Note Due on Visit 20    PT Start Time 1325    PT Stop Time 1400    PT Time Calculation (min) 35 min    Equipment Utilized During Treatment Gait belt    Behavior During Therapy WFL for tasks assessed/performed             Past Medical History:  Diagnosis Date   Breast cancer (HCC)    left breast cancer   Cancer (HCC) 09/2017   left breast cancer   Family history of breast cancer    Hypothyroidism    Personal history of radiation therapy 2019   Thyroid disease    Past Surgical History:  Procedure Laterality Date   BREAST BIOPSY Left 2018   BREAST BIOPSY Left 2019   BREAST RECONSTRUCTION WITH PLACEMENT OF TISSUE EXPANDER AND ALLODERM Left 10/26/2017   Procedure: LEFT BREAST RECONSTRUCTION WITH PLACEMENT OF TISSUE EXPANDER AND ALLODERM;  Surgeon: Glenna Fellows, MD;  Location: [facility];  Service: Plastics;  Laterality: Left;   MASTECTOMY Left 2019   MASTECTOMY WITH RADIOACTIVE SEED GUIDED EXCISION AND AXILLARY SENTINEL LYMPH NODE BIOPSY Left 10/26/2017   Procedure: LEFT MASTECTOMY WITH SEED TARGETED  LEFT AXILLARY LYMPH NODE EXCISION AND LEFT SENTINEL LYMPH NODE BIOPSY;  Surgeon: Almond Lint, MD;  Location: [facility];  Service: General;  Laterality: Left;   TONSILLECTOMY     WISDOM TOOTH EXTRACTION     Patient Active Problem List   Diagnosis Date Noted   Metastatic 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) 10/14/2022   Elevated liver enzymes 10/14/2022   Unexplained weight loss 10/14/2022   Neoplasm related pain 10/14/2022   Metastatic malignant neoplasm (HCC) 12/19/2021   Family history of breast  cancer    History of therapeutic radiation 04/27/2018   Breast cancer, left breast (HCC) 10/26/2017   Malignant neoplasm of lower-outer quadrant of left breast of female, estrogen receptor positive (HCC) 07/15/2016   Plantar fasciitis 07/30/2015   Metatarsalgia 10/18/2014    ONSET DATE: September 2023   REFERRING DIAG:  Diagnosis  C79.51 (ICD-10-CM) - Secondary malignant neoplasm of bone    THERAPY DIAG:  Muscle weakness (generalized)  Stiffness of cervical spine  Difficulty in walking, not elsewhere classified  Painful cervical ROM  Decreased range of motion of right shoulder  Acute pain of left knee  Decreased range of motion of left shoulder  Leg weakness, bilateral  Rationale for Evaluation and Treatment: Rehabilitation  SUBJECTIVE:  SUBJECTIVE STATEMENT:  Arrived late to PT treatment due to traffic. Pt reports that she is doing okay. CA infusion last week. Blood work taken states that numbers are all re-assuring per pt. Mild cervical stiffness from sleeping on side.      Pt accompanied by:  none.   PERTINENT HISTORY:   Pt is 62 y.o. female that is seeking therapy for weakness of the R LE. Pt tripped over the threshold of her door back in September of 2023, and had a transverse fracture of the L patella when she fell on it. She was recently diagnosed with bone cancer back in October of 2023. Pt notes that she has more stiffness in the neck and pelvis region because the cancer is located in those locations.   Pt had exacerbation of s/s in June of 2024 with hospitalization and subsequent change in oncology treatment limiting ability to attend PT. Pt has responded well to treatment and now able to attend PT.   PAIN:  Are you having pain? Yes: NPRS scale: 2/10 Pain location: neck  and shoulders  Pain description: stiffness  Aggravating factors: standing  Relieving factors: small pillows   PRECAUTIONS: Cervical and Other: cervical for comfort   RED FLAGS: None   WEIGHT BEARING RESTRICTIONS: No  FALLS: Has patient fallen in last 6 months? Yes. Number of falls 1  LIVING ENVIRONMENT: Lives with: lives alone Lives in: House/apartment Stairs:  yes, but uses ramp  Has following equipment at home: Walker - 4 wheeled  PLOF: Needs assistance with ADLs and Needs assistance with homemaking  PATIENT GOALS: improve balance, strength and endurance. Stand at kitchen longer and   OBJECTIVE:   DIAGNOSTIC FINDINGS:   EXAM: BILATERAL LOWER EXTREMITY VENOUS DOPPLER ULTRASOUND IMPRESSION: Negative.  EXAM: CT HEAD WITHOUT AND WITH CONTRAST IMPRESSION: 1. Extensive osseous metastatic disease throughout the calvarium. Extraosseous soft tissue tumor in the right orbit appears improved, while the additional areas of extraosseous tumor described in detail above are otherwise overall not significantly changed. 2. Tumor is again inseparable from the anterior aspect of the superior sagittal sinus and left sigmoid sinus, unchanged. 3. Decreased but not resolved infiltrative thickening of the left inferior and medial rectus muscles. 4. Unchanged innumerable scalp nodules, nonspecific but possibly metastatic lesions.     COGNITION: Overall cognitive status: Within functional limits for tasks assessed   SENSATION: WFL  COORDINATION: Limited due to ROM deficits. WFL    MUSCLE TONE: WFL    POSTURE: rounded shoulders, forward head, and decreased lumbar lordosis  LOWER EXTREMITY MMT:   MMT  Right Eval Left Eval  Hip flexion 4 4  Hip extension    Hip abduction 4 4  Hip adduction 4+ 4+  Hip internal rotation    Hip external rotation    Knee flexion 4 4  Knee extension 4+ 4+  Ankle dorsiflexion 5 5  Ankle plantarflexion 4 4  Ankle inversion    Ankle  eversion     (Blank rows = not tested)  LOWER EXTREMITY ROM:     To be tested BLE and BUE   BED MOBILITY:  Sit to supine SBA Supine to sit SBA  TRANSFERS: Assistive device utilized: None  Sit to stand: Modified independence Stand to sit: Modified independence Chair to chair: Modified independence Floor: Modified independence   CURB:  Level of Assistance: CGA Assistive device utilized:  [employer] Comments: step to pattern  STAIRS: Level of Assistance: CGA Stair Negotiation Technique: Step to Pattern with Bilateral Rails Number  of Stairs: 4  Height of Stairs: 6  Comments: step to with BUE support   GAIT: Gait pattern: step through pattern and circumduction- Right Distance walked: 175ft Assistive device utilized: None Level of assistance: SBA Comments: mild R LE circumduction, but greatly improved since last bout of PT treatment.   FUNCTIONAL TESTS:  5 times sit to stand: 15.82 Timed up and go (TUG): 15.72 6 minute walk test: TBD 10 meter walk test: 10.225sec (0.1m/s)  Berg Balance Scale: 4656 Functional gait assessment: TBD.   PATIENT SURVEYS:  FOTO 63 ( goal 68)  TODAY'S TREATMENT:                                                                                                                              DATE: 02/10/2023  UBE, 2 min forward/ 3 min reverse 1 min rest break between bouts.   Sitting therex 5# AW  LAQ  x 12 bil  Hip flexion with 2 sec hold Hip abduction/adduction over hurdle x 10 bil  Hip adduction 2x 15 with 3 sec hold  Pilates ball raise to eye level x 15   Gait with 5# AW 2x383ft with  cues for step height initially.     PATIENT EDUCATION: Education details: Pt educated throughout session about proper posture and technique with exercises. Improved exercise technique, movement at target joints, use of target muscles after min to mod verbal, visual, tactile cues.  Person educated: Patient and Caregiver MAry Education method:  Explanation Education comprehension: verbalized understanding  HOME EXERCISE PROGRAM: Access Code: [email or web address] URL: [email or web address] Date: 12/14/2022 Prepared by: Grier Rocher  Exercises - Mini Squat with Counter Support  - 1 x daily - 7 x weekly - 3 sets - 10 reps - Standing Hip Abduction with Counter Support  - 1 x daily - 7 x weekly - 3 sets - 10 reps - Standing March with Counter Support  - 1 x daily - 7 x weekly - 3 sets - 10 reps - Seated Long Arc Quad  - 1 x daily - 7 x weekly - 3 sets - 10 reps  GOALS: Goals reviewed with patient? Yes  SHORT TERM GOALS: Target date: 01/13/2023    Patient will be independent in home exercise program to improve strength/mobility for better functional independence with ADLs. Baseline:to  given 9/30  10/29. Pt reports inconsistent HEP completion. Goal status: IN PROGRESS   LONG TERM GOALS: Target date: 03/03/2023    Patient will increase FOTO score to equal to or greater than  68   to demonstrate statistically significant improvement in mobility and quality of life.  Baseline: 63  10/29: 74 Goal status: MET  2.  Patient (> 48 years old) will complete five times sit to stand test in < 15 seconds without use of BUE indicating an increased LE strength and improved balance. Baseline: 15.82 with UE support  10/29: 13.84 with UE support push from seat  Goal status: MET; revised  3.  Patient will increase Berg Balance score by > 6 points to demonstrate decreased fall risk during functional activities Baseline: 46 10/29: 49  Goal status: IN PROGRESS  4.  Patient will increase 6 min walk test to >1248ft as to improve gait speed for better community ambulation and to reduce fall risk. Baseline: 950 10/29 1175ft  Goal status: MET ; revised   5.  Patient will reduce timed up and go to <11 seconds to reduce fall risk and demonstrate improved transfer/gait ability. Baseline: 15.72 10/29: 9.97sec  Goal status: MET  6.   Patient will increase FGA score to >22 as to demonstrate reduced fall risk and improved dynamic gait balance for better safety with community/home ambulation.   Baseline:19 10/29 24 Goal status: MET   ASSESSMENT:  CLINICAL IMPRESSION: Pt put forth excellent effort for PT treatment to improve BUE and BLE strength. Session limited by late arrival. Tolerated increased resistance for BLE therex and distance of weighted gait despite being tired from infusion last week.  Pt will continue to benefit from skilled PT to address balance, strength and ROM deficits, improve safety and QoL while continuing CA treatment.       OBJECTIVE IMPAIRMENTS: Abnormal gait, cardiopulmonary status limiting activity, decreased activity tolerance, decreased balance, decreased endurance, decreased knowledge of condition, decreased mobility, difficulty walking, decreased ROM, decreased strength, hypomobility, increased fascial restrictions, impaired perceived functional ability, impaired flexibility, impaired UE functional use, improper body mechanics, postural dysfunction, and pain.   ACTIVITY LIMITATIONS: carrying, lifting, bending, standing, squatting, sleeping, stairs, transfers, bathing, toileting, dressing, reach over head, and locomotion level  PARTICIPATION LIMITATIONS: cleaning, laundry, driving, shopping, community activity, and yard work  PERSONAL FACTORS: Age, Behavior pattern, Fitness, Past/current experiences, and 1-2 comorbidities: CA with profound metastases   are also affecting patient's functional outcome.   REHAB POTENTIAL: Good  CLINICAL DECISION MAKING: Evolving/moderate complexity  EVALUATION COMPLEXITY: Moderate  PLAN:  PT FREQUENCY: 1-2x/week  PT DURATION: 12 weeks  PLANNED INTERVENTIONS: Therapeutic exercises, Therapeutic activity, Neuromuscular re-education, Balance training, Gait training, Patient/Family education, Self Care, Joint mobilization, Stair training, DME instructions,  Cryotherapy, Moist heat, and Manual therapy  PLAN FOR NEXT SESSION:   Continue BLE strengthening and balance training.  UE and cervical ROM as tolerated.  Goal assessment due to visit limit from insurance.    Golden Pop, PT 02/10/2023, 1:30 PM

## 2023-02-15 ENCOUNTER — Ambulatory Visit: Payer: Medicaid Other | Admitting: Physical Therapy

## 2023-02-16 ENCOUNTER — Other Ambulatory Visit: Payer: Medicaid Other

## 2023-02-16 ENCOUNTER — Ambulatory Visit: Payer: Medicaid Other | Admitting: Hematology and Oncology

## 2023-02-16 ENCOUNTER — Ambulatory Visit: Payer: Medicaid Other

## 2023-02-17 ENCOUNTER — Ambulatory Visit: Payer: Medicaid Other | Attending: Hematology and Oncology | Admitting: Physical Therapy

## 2023-02-17 DIAGNOSIS — M6281 Muscle weakness (generalized): Secondary | ICD-10-CM | POA: Diagnosis present

## 2023-02-17 DIAGNOSIS — M25612 Stiffness of left shoulder, not elsewhere classified: Secondary | ICD-10-CM | POA: Diagnosis present

## 2023-02-17 DIAGNOSIS — R262 Difficulty in walking, not elsewhere classified: Secondary | ICD-10-CM | POA: Diagnosis present

## 2023-02-17 DIAGNOSIS — M542 Cervicalgia: Secondary | ICD-10-CM | POA: Insufficient documentation

## 2023-02-17 DIAGNOSIS — R29898 Other symptoms and signs involving the musculoskeletal system: Secondary | ICD-10-CM | POA: Insufficient documentation

## 2023-02-17 DIAGNOSIS — M25562 Pain in left knee: Secondary | ICD-10-CM | POA: Insufficient documentation

## 2023-02-17 DIAGNOSIS — M25611 Stiffness of right shoulder, not elsewhere classified: Secondary | ICD-10-CM | POA: Diagnosis present

## 2023-02-17 DIAGNOSIS — M436 Torticollis: Secondary | ICD-10-CM | POA: Insufficient documentation

## 2023-02-17 NOTE — Therapy (Unsigned)
OUTPATIENT PHYSICAL THERAPY    Patient Name: Kathryn Lucas MRN: [id number] DOB:01/16/1961, 62 y.o., female Today's Date: 02/17/2023   PCP: Ollen Bowl, MD  REFERRING PROVIDER: [name], MD   END OF SESSION:  PT End of Session - 02/17/23 1318     Visit Number 17    Number of Visits 24    Date for PT Re-Evaluation 03/01/23    Progress Note Due on Visit 20    PT Start Time 1320    PT Stop Time 1400    PT Time Calculation (min) 40 min    Equipment Utilized During Treatment Gait belt    Behavior During Therapy WFL for tasks assessed/performed             Past Medical History:  Diagnosis Date   Breast cancer (HCC)    left breast cancer   Cancer (HCC) 09/2017   left breast cancer   Family history of breast cancer    Hypothyroidism    Personal history of radiation therapy 2019   Thyroid disease    Past Surgical History:  Procedure Laterality Date   BREAST BIOPSY Left 2018   BREAST BIOPSY Left 2019   BREAST RECONSTRUCTION WITH PLACEMENT OF TISSUE EXPANDER AND ALLODERM Left 10/26/2017   Procedure: LEFT BREAST RECONSTRUCTION WITH PLACEMENT OF TISSUE EXPANDER AND ALLODERM;  Surgeon: Glenna Fellows, MD;  Location: [facility];  Service: Plastics;  Laterality: Left;   MASTECTOMY Left 2019   MASTECTOMY WITH RADIOACTIVE SEED GUIDED EXCISION AND AXILLARY SENTINEL LYMPH NODE BIOPSY Left 10/26/2017   Procedure: LEFT MASTECTOMY WITH SEED TARGETED  LEFT AXILLARY LYMPH NODE EXCISION AND LEFT SENTINEL LYMPH NODE BIOPSY;  Surgeon: Almond Lint, MD;  Location: [facility];  Service: General;  Laterality: Left;   TONSILLECTOMY     WISDOM TOOTH EXTRACTION     Patient Active Problem List   Diagnosis Date Noted   Metastatic 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) 10/14/2022   Elevated liver enzymes 10/14/2022   Unexplained weight loss 10/14/2022   Neoplasm related pain 10/14/2022   Metastatic malignant neoplasm (HCC) 12/19/2021   Family history of breast cancer     History of therapeutic radiation 04/27/2018   Breast cancer, left breast (HCC) 10/26/2017   Malignant neoplasm of lower-outer quadrant of left breast of female, estrogen receptor positive (HCC) 07/15/2016   Plantar fasciitis 07/30/2015   Metatarsalgia 10/18/2014    ONSET DATE: September 2023   REFERRING DIAG:  Diagnosis  C79.51 (ICD-10-CM) - Secondary malignant neoplasm of bone    THERAPY DIAG:  Muscle weakness (generalized)  Stiffness of cervical spine  Difficulty in walking, not elsewhere classified  Painful cervical ROM  Decreased range of motion of right shoulder  Acute pain of left knee  Decreased range of motion of left shoulder  Leg weakness, bilateral  Rationale for Evaluation and Treatment: Rehabilitation  SUBJECTIVE:  SUBJECTIVE STATEMENT:  Pt arrives at PT motivated to participate. Understands that this may be last visit pending approval for additional visits from insurance.    Pt accompanied by:  none.   PERTINENT HISTORY:   Pt is 62 y.o. female that is seeking therapy for weakness of the R LE. Pt tripped over the threshold of her door back in September of 2023, and had a transverse fracture of the L patella when she fell on it. She was recently diagnosed with bone cancer back in October of 2023. Pt notes that she has more stiffness in the neck and pelvis region because the cancer is located in those locations.   Pt had exacerbation of s/s in June of 2024 with hospitalization and subsequent change in oncology treatment limiting ability to attend PT. Pt has responded well to treatment and now able to attend PT.   PAIN:  Are you having pain? Yes: NPRS scale: 2/10 Pain location: neck and shoulders  Pain description: stiffness  Aggravating factors: standing  Relieving  factors: small pillows   PRECAUTIONS: Cervical and Other: cervical for comfort   RED FLAGS: None   WEIGHT BEARING RESTRICTIONS: No  FALLS: Has patient fallen in last 6 months? Yes. Number of falls 1  LIVING ENVIRONMENT: Lives with: lives alone Lives in: House/apartment Stairs:  yes, but uses ramp  Has following equipment at home: Walker - 4 wheeled  PLOF: Needs assistance with ADLs and Needs assistance with homemaking  PATIENT GOALS: improve balance, strength and endurance. Stand at kitchen longer and   OBJECTIVE:   DIAGNOSTIC FINDINGS:   EXAM: BILATERAL LOWER EXTREMITY VENOUS DOPPLER ULTRASOUND IMPRESSION: Negative.  EXAM: CT HEAD WITHOUT AND WITH CONTRAST IMPRESSION: 1. Extensive osseous metastatic disease throughout the calvarium. Extraosseous soft tissue tumor in the right orbit appears improved, while the additional areas of extraosseous tumor described in detail above are otherwise overall not significantly changed. 2. Tumor is again inseparable from the anterior aspect of the superior sagittal sinus and left sigmoid sinus, unchanged. 3. Decreased but not resolved infiltrative thickening of the left inferior and medial rectus muscles. 4. Unchanged innumerable scalp nodules, nonspecific but possibly metastatic lesions.     COGNITION: Overall cognitive status: Within functional limits for tasks assessed   SENSATION: WFL  COORDINATION: Limited due to ROM deficits. WFL    MUSCLE TONE: WFL    POSTURE: rounded shoulders, forward head, and decreased lumbar lordosis  LOWER EXTREMITY MMT:   MMT  Right Eval Left Eval  Hip flexion 4 4  Hip extension    Hip abduction 4 4  Hip adduction 4+ 4+  Hip internal rotation    Hip external rotation    Knee flexion 4 4  Knee extension 4+ 4+  Ankle dorsiflexion 5 5  Ankle plantarflexion 4 4  Ankle inversion    Ankle eversion     (Blank rows = not tested)  LOWER EXTREMITY ROM:     To be tested BLE and  BUE   BED MOBILITY:  Sit to supine SBA Supine to sit SBA  TRANSFERS: Assistive device utilized: None  Sit to stand: Modified independence Stand to sit: Modified independence Chair to chair: Modified independence Floor: Modified independence   CURB:  Level of Assistance: CGA Assistive device utilized:  [employer] Comments: step to pattern  STAIRS: Level of Assistance: CGA Stair Negotiation Technique: Step to Pattern with Bilateral Rails Number of Stairs: 4  Height of Stairs: 6  Comments: step to with BUE support   GAIT:  Gait pattern: step through pattern and circumduction- Right Distance walked: 145ft Assistive device utilized: None Level of assistance: SBA Comments: mild R LE circumduction, but greatly improved since last bout of PT treatment.   FUNCTIONAL TESTS:  5 times sit to stand: 15.82 Timed up and go (TUG): 15.72 6 minute walk test: TBD 10 meter walk test: 10.225sec (0.8m/s)  Berg Balance Scale: 4656 Functional gait assessment: TBD.   PATIENT SURVEYS:  FOTO 63 ( goal 68)  TODAY'S TREATMENT:                                                                                                                              DATE: 02/17/2023  Nustep level 1 x 3 min + 2 min with 2 min rest break between bouts.   Gait in therapy gym without AD x 182ft. Pt demonstrates only mild trunk sway and improved step length and candence in BLE.   PT performed goal assessment as noted below givien possible denial of additional visits from insurance.     HEP expanded to encourage continued physical activity as listed below   PATIENT EDUCATION: Education details: Pt educated throughout session about proper posture and technique with exercises. Improved exercise technique, movement at target joints, use of target muscles after min to mod verbal, visual, tactile cues.  Person educated: Patient and Caregiver MAry Education method: Explanation Education comprehension: verbalized  understanding  HOME EXERCISE PROGRAM: Access Code: [email or web address] URL: [email or web address] Date: 02/18/2023 Prepared by: Grier Rocher  Exercises - Mini Squat with Counter Support  - 1 x daily - 7 x weekly - 3 sets - 10 reps - Standing Hip Abduction with Counter Support  - 1 x daily - 7 x weekly - 3 sets - 10 reps - Standing March with Counter Support  - 1 x daily - 7 x weekly - 3 sets - 10 reps - Seated Long Arc Quad  - 1 x daily - 7 x weekly - 3 sets - 10 reps - Sidelying Hip Abduction  - 1 x daily - 7 x weekly - 3 sets - 10 reps - Supine Active Straight Leg Raise  - 1 x daily - 7 x weekly - 3 sets - 10 reps - Supine Quad Set  - 1 x daily - 7 x weekly - 3 sets - 10 reps - Clamshell  - 1 x daily - 7 x weekly - 3 sets - 10 reps - Seated Hip Abduction with Resistance  - 1 x daily - 7 x weekly - 3 sets - 10 reps - Seated Knee Extension with Resistance  - 1 x daily - 7 x weekly - 3 sets - 10 reps - Seated March with Resistance  - 1 x daily - 7 x weekly - 3 sets - 10 reps - Seated Heel Toe Raises  - 1 x daily - 7 x weekly - 3 sets - 10 reps - Seated Ankle Plantarflexion with Resistance  -  1 x daily - 7 x weekly - 3 sets - 10 reps  GOALS: Goals reviewed with patient? Yes  SHORT TERM GOALS: Target date: 01/13/2023    Patient will be independent in home exercise program to improve strength/mobility for better functional independence with ADLs. Baseline:to  given 9/30  10/29. Pt reports inconsistent HEP completion. Goal status: IN PROGRESS   LONG TERM GOALS: Target date: 03/03/2023    Patient will increase FOTO score to equal to or greater than  68   to demonstrate statistically significant improvement in mobility and quality of life.  Baseline: 63  10/29: 74 02/17/2023: 78  Goal status: MET  2.  Patient (> 79 years old) will complete five times sit to stand test in < 15 seconds without use of BUE indicating an increased LE strength and improved balance. Baseline:  15.82 with UE support  10/29: 13.84 with UE support push from seat  12/4: 11.84sec pushing from thighs.  Goal status: MET; revised   3.  Patient will increase Berg Balance score by > 6 points to demonstrate decreased fall risk during functional activities Baseline: 46 10/29: 49  12/4: 52 Goal status: MET  4.  Patient will increase 6 min walk test to >1255ft as to improve gait speed for better community ambulation and to reduce fall risk. Baseline: 950 10/29 1174ft  12/4: 1150  Goal status: IN PROGRESS ; revised   5.  Patient will reduce timed up and go to <11 seconds to reduce fall risk and demonstrate improved transfer/gait ability. Baseline: 15.72 10/29: 9.97sec  12/4: 8.4 sec  Goal status: MET  6.  Patient will increase FGA score to >22 as to demonstrate reduced fall risk and improved dynamic gait balance for better safety with community/home ambulation.   Baseline:19 10/29 24 Goal status: MET   ASSESSMENT:  CLINICAL IMPRESSION: Pt put forth excellent effort for PT treatment for goal assessment. Pt demonstrated improved balance, endurance, and gait speed indicating improved community access and reduced fall risk, but has not met goal for 6 min walk test at this point. Pt may continue to benefit from skilled PT pending additional authorization for PT treatment  to address balance, strength and ROM deficits, improve safety and QoL while continuing CA treatment.    OBJECTIVE IMPAIRMENTS: Abnormal gait, cardiopulmonary status limiting activity, decreased activity tolerance, decreased balance, decreased endurance, decreased knowledge of condition, decreased mobility, difficulty walking, decreased ROM, decreased strength, hypomobility, increased fascial restrictions, impaired perceived functional ability, impaired flexibility, impaired UE functional use, improper body mechanics, postural dysfunction, and pain.   ACTIVITY LIMITATIONS: carrying, lifting, bending, standing, squatting,  sleeping, stairs, transfers, bathing, toileting, dressing, reach over head, and locomotion level  PARTICIPATION LIMITATIONS: cleaning, laundry, driving, shopping, community activity, and yard work  PERSONAL FACTORS: Age, Behavior pattern, Fitness, Past/current experiences, and 1-2 comorbidities: CA with profound metastases   are also affecting patient's functional outcome.   REHAB POTENTIAL: Good  CLINICAL DECISION MAKING: Evolving/moderate complexity  EVALUATION COMPLEXITY: Moderate  PLAN:  PT FREQUENCY: 1-2x/week  PT DURATION: 12 weeks  PLANNED INTERVENTIONS: Therapeutic exercises, Therapeutic activity, Neuromuscular re-education, Balance training, Gait training, Patient/Family education, Self Care, Joint mobilization, Stair training, DME instructions, Cryotherapy, Moist heat, and Manual therapy  PLAN FOR NEXT SESSION:   Continue BLE strengthening and balance training.  UE and cervical ROM as tolerated.  Goal assessment due to visit limit from insurance.    Golden Pop, PT 02/17/2023, 1:19 PM

## 2023-02-18 ENCOUNTER — Ambulatory Visit
Admission: RE | Admit: 2023-02-18 | Discharge: 2023-02-18 | Disposition: A | Discharge status: Home / Self Care | Payer: Medicaid Other | Source: Ambulatory Visit | Attending: Hematology and Oncology | Admitting: Hematology and Oncology

## 2023-02-18 DIAGNOSIS — C799 Secondary malignant neoplasm of unspecified site: Secondary | ICD-10-CM | POA: Insufficient documentation

## 2023-02-18 DIAGNOSIS — Z170 Estrogen receptor positive status [ER+]: Secondary | ICD-10-CM | POA: Insufficient documentation

## 2023-02-18 DIAGNOSIS — C50512 Malignant neoplasm of lower-outer quadrant of left female breast: Secondary | ICD-10-CM | POA: Diagnosis present

## 2023-02-18 MED ORDER — IOHEXOL 300 MG/ML  SOLN
100.0000 mL | Freq: Once | INTRAMUSCULAR | Status: AC | PRN
  Administered 2023-02-18: 100 mL via INTRAVENOUS

## 2023-02-22 ENCOUNTER — Ambulatory Visit: Payer: Medicaid Other | Admitting: Physical Therapy

## 2023-02-23 ENCOUNTER — Other Ambulatory Visit: Payer: Self-pay | Admitting: Hematology and Oncology

## 2023-02-23 DIAGNOSIS — C50512 Malignant neoplasm of lower-outer quadrant of left female breast: Secondary | ICD-10-CM

## 2023-02-24 ENCOUNTER — Ambulatory Visit: Payer: Medicaid Other | Admitting: Physical Therapy

## 2023-02-24 DIAGNOSIS — R29898 Other symptoms and signs involving the musculoskeletal system: Secondary | ICD-10-CM

## 2023-02-24 DIAGNOSIS — M6281 Muscle weakness (generalized): Secondary | ICD-10-CM

## 2023-02-24 DIAGNOSIS — M25562 Pain in left knee: Secondary | ICD-10-CM

## 2023-02-24 DIAGNOSIS — M25611 Stiffness of right shoulder, not elsewhere classified: Secondary | ICD-10-CM

## 2023-02-24 DIAGNOSIS — M25612 Stiffness of left shoulder, not elsewhere classified: Secondary | ICD-10-CM

## 2023-02-24 DIAGNOSIS — R262 Difficulty in walking, not elsewhere classified: Secondary | ICD-10-CM

## 2023-02-24 DIAGNOSIS — M436 Torticollis: Secondary | ICD-10-CM

## 2023-02-24 DIAGNOSIS — M542 Cervicalgia: Secondary | ICD-10-CM

## 2023-02-24 NOTE — Therapy (Signed)
OUTPATIENT PHYSICAL THERAPY    Patient Name: Kathryn Lucas MRN: [id number] DOB:August 15, 1960, 62 y.o., female Today's Date: 02/24/2023   PCP: Ollen Bowl, MD  REFERRING PROVIDER: [name], MD   END OF SESSION:  PT End of Session - 02/24/23 1449     Visit Number 18    Number of Visits 24    Date for PT Re-Evaluation 03/01/23    Progress Note Due on Visit 20    PT Start Time 1449    PT Stop Time 1530    PT Time Calculation (min) 41 min    Equipment Utilized During Treatment Gait belt    Behavior During Therapy WFL for tasks assessed/performed             Past Medical History:  Diagnosis Date   Breast cancer (HCC)    left breast cancer   Cancer (HCC) 09/2017   left breast cancer   Family history of breast cancer    Hypothyroidism    Personal history of radiation therapy 2019   Thyroid disease    Past Surgical History:  Procedure Laterality Date   BREAST BIOPSY Left 2018   BREAST BIOPSY Left 2019   BREAST RECONSTRUCTION WITH PLACEMENT OF TISSUE EXPANDER AND ALLODERM Left 10/26/2017   Procedure: LEFT BREAST RECONSTRUCTION WITH PLACEMENT OF TISSUE EXPANDER AND ALLODERM;  Surgeon: Glenna Fellows, MD;  Location: [facility];  Service: Plastics;  Laterality: Left;   MASTECTOMY Left 2019   MASTECTOMY WITH RADIOACTIVE SEED GUIDED EXCISION AND AXILLARY SENTINEL LYMPH NODE BIOPSY Left 10/26/2017   Procedure: LEFT MASTECTOMY WITH SEED TARGETED  LEFT AXILLARY LYMPH NODE EXCISION AND LEFT SENTINEL LYMPH NODE BIOPSY;  Surgeon: Almond Lint, MD;  Location: [facility];  Service: General;  Laterality: Left;   TONSILLECTOMY     WISDOM TOOTH EXTRACTION     Patient Active Problem List   Diagnosis Date Noted   Metastatic 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) 10/14/2022   Elevated liver enzymes 10/14/2022   Unexplained weight loss 10/14/2022   Neoplasm related pain 10/14/2022   Metastatic malignant neoplasm (HCC) 12/19/2021   Family history of breast  cancer    History of therapeutic radiation 04/27/2018   Breast cancer, left breast (HCC) 10/26/2017   Malignant neoplasm of lower-outer quadrant of left breast of female, estrogen receptor positive (HCC) 07/15/2016   Plantar fasciitis 07/30/2015   Metatarsalgia 10/18/2014    ONSET DATE: September 2023   REFERRING DIAG:  Diagnosis  C79.51 (ICD-10-CM) - Secondary malignant neoplasm of bone    THERAPY DIAG:  Muscle weakness (generalized)  Stiffness of cervical spine  Difficulty in walking, not elsewhere classified  Painful cervical ROM  Decreased range of motion of right shoulder  Acute pain of left knee  Decreased range of motion of left shoulder  Leg weakness, bilateral  Rationale for Evaluation and Treatment: Rehabilitation  SUBJECTIVE:  SUBJECTIVE STATEMENT:  Pt reports to PT session without pain.  Ready to participate.  Missed last scheduled PT session due to arrival at the wrong time. Will miss next week due to CA treatment.    Pt accompanied by:  none.   PERTINENT HISTORY:   Pt is 62 y.o. female that is seeking therapy for weakness of the R LE. Pt tripped over the threshold of her door back in September of 2023, and had a transverse fracture of the L patella when she fell on it. She was recently diagnosed with bone cancer back in October of 2023. Pt notes that she has more stiffness in the neck and pelvis region because the cancer is located in those locations.   Pt had exacerbation of s/s in June of 2024 with hospitalization and subsequent change in oncology treatment limiting ability to attend PT. Pt has responded well to treatment and now able to attend PT.   PAIN:  Are you having pain? Yes: NPRS scale: 2/10 Pain location: neck and shoulders  Pain description: stiffness   Aggravating factors: standing  Relieving factors: small pillows   PRECAUTIONS: Cervical and Other: cervical for comfort   RED FLAGS: None   WEIGHT BEARING RESTRICTIONS: No  FALLS: Has patient fallen in last 6 months? Yes. Number of falls 1  LIVING ENVIRONMENT: Lives with: lives alone Lives in: House/apartment Stairs:  yes, but uses ramp  Has following equipment at home: Walker - 4 wheeled  PLOF: Needs assistance with ADLs and Needs assistance with homemaking  PATIENT GOALS: improve balance, strength and endurance. Stand at kitchen longer and   OBJECTIVE:   DIAGNOSTIC FINDINGS:   EXAM: BILATERAL LOWER EXTREMITY VENOUS DOPPLER ULTRASOUND IMPRESSION: Negative.  EXAM: CT HEAD WITHOUT AND WITH CONTRAST IMPRESSION: 1. Extensive osseous metastatic disease throughout the calvarium. Extraosseous soft tissue tumor in the right orbit appears improved, while the additional areas of extraosseous tumor described in detail above are otherwise overall not significantly changed. 2. Tumor is again inseparable from the anterior aspect of the superior sagittal sinus and left sigmoid sinus, unchanged. 3. Decreased but not resolved infiltrative thickening of the left inferior and medial rectus muscles. 4. Unchanged innumerable scalp nodules, nonspecific but possibly metastatic lesions.     COGNITION: Overall cognitive status: Within functional limits for tasks assessed   SENSATION: WFL  COORDINATION: Limited due to ROM deficits. WFL    MUSCLE TONE: WFL    POSTURE: rounded shoulders, forward head, and decreased lumbar lordosis  LOWER EXTREMITY MMT:   MMT  Right Eval Left Eval  Hip flexion 4 4  Hip extension    Hip abduction 4 4  Hip adduction 4+ 4+  Hip internal rotation    Hip external rotation    Knee flexion 4 4  Knee extension 4+ 4+  Ankle dorsiflexion 5 5  Ankle plantarflexion 4 4  Ankle inversion    Ankle eversion     (Blank rows = not tested)  LOWER  EXTREMITY ROM:     To be tested BLE and BUE   BED MOBILITY:  Sit to supine SBA Supine to sit SBA  TRANSFERS: Assistive device utilized: None  Sit to stand: Modified independence Stand to sit: Modified independence Chair to chair: Modified independence Floor: Modified independence   CURB:  Level of Assistance: CGA Assistive device utilized:  Youth worker Comments: step to pattern  STAIRS: Level of Assistance: CGA Stair Negotiation Technique: Step to Pattern with Bilateral Rails Number of Stairs: 4  Height of Stairs:  6  Comments: step to with BUE support   GAIT: Gait pattern: step through pattern and circumduction- Right Distance walked: 173ft Assistive device utilized: None Level of assistance: SBA Comments: mild R LE circumduction, but greatly improved since last bout of PT treatment.   FUNCTIONAL TESTS:  5 times sit to stand: 15.82 Timed up and go (TUG): 15.72 6 minute walk test: TBD 10 meter walk test: 10.225sec (0.69m/s)  Berg Balance Scale: 4656 Functional gait assessment: TBD.   PATIENT SURVEYS:  FOTO 63 ( goal 68)  TODAY'S TREATMENT:                                                                                                                              DATE: 02/24/2023  Gait without resistance x 358ft.  UBE 2 min forward/2 min reverse. 1 min seated rest break  Sit<>stand holding GTB under Bil feet. 3 x 10 from decreasing height on EOM.   Lateral step up with BUE support x 6 bil   Forward step up 1 UE support x 7 min   Calf raises 2 x 15  Elevated kettle bell dead lift at 12 inches 2 x 8   Suit case carry x 173ft with 9# dumbbells   Throughout session pt required occasional seated rest breaks due to mild SOB and LE fatigue. No LOB throughout session and reduced use of BUE with step ups compared to prior sessions with this PT.  PATIENT EDUCATION: Education details: Pt educated throughout session about proper posture and technique with exercises.  Improved exercise technique, movement at target joints, use of target muscles after min to mod verbal, visual, tactile cues.  Person educated: Patient and Caregiver MAry Education method: Explanation Education comprehension: verbalized understanding  HOME EXERCISE PROGRAM: Access Code: [email or web address] URL: [email or web address] Date: 02/18/2023 Prepared by: Grier Rocher  Exercises - Mini Squat with Counter Support  - 1 x daily - 7 x weekly - 3 sets - 10 reps - Standing Hip Abduction with Counter Support  - 1 x daily - 7 x weekly - 3 sets - 10 reps - Standing March with Counter Support  - 1 x daily - 7 x weekly - 3 sets - 10 reps - Seated Long Arc Quad  - 1 x daily - 7 x weekly - 3 sets - 10 reps - Sidelying Hip Abduction  - 1 x daily - 7 x weekly - 3 sets - 10 reps - Supine Active Straight Leg Raise  - 1 x daily - 7 x weekly - 3 sets - 10 reps - Supine Quad Set  - 1 x daily - 7 x weekly - 3 sets - 10 reps - Clamshell  - 1 x daily - 7 x weekly - 3 sets - 10 reps - Seated Hip Abduction with Resistance  - 1 x daily - 7 x weekly - 3 sets - 10 reps - Seated Knee Extension with Resistance  - 1 x daily -  7 x weekly - 3 sets - 10 reps - Seated March with Resistance  - 1 x daily - 7 x weekly - 3 sets - 10 reps - Seated Heel Toe Raises  - 1 x daily - 7 x weekly - 3 sets - 10 reps - Seated Ankle Plantarflexion with Resistance  - 1 x daily - 7 x weekly - 3 sets - 10 reps  GOALS: Goals reviewed with patient? Yes  SHORT TERM GOALS: Target date: 01/13/2023    Patient will be independent in home exercise program to improve strength/mobility for better functional independence with ADLs. Baseline:to  given 9/30  10/29. Pt reports inconsistent HEP completion. Goal status: IN PROGRESS   LONG TERM GOALS: Target date: 03/03/2023    Patient will increase FOTO score to equal to or greater than  68   to demonstrate statistically significant improvement in mobility and quality of life.   Baseline: 63  10/29: 74 02/17/2023: 78  Goal status: MET  2.  Patient (> 37 years old) will complete five times sit to stand test in < 15 seconds without use of BUE indicating an increased LE strength and improved balance. Baseline: 15.82 with UE support  10/29: 13.84 with UE support push from seat  12/4: 11.84sec pushing from thighs.  Goal status: MET; revised   3.  Patient will increase Berg Balance score by > 6 points to demonstrate decreased fall risk during functional activities Baseline: 46 10/29: 49  12/4: 52 Goal status: MET  4.  Patient will increase 6 min walk test to >1262ft as to improve gait speed for better community ambulation and to reduce fall risk. Baseline: 950 10/29 1141ft  12/4: 1150  Goal status: IN PROGRESS ; revised   5.  Patient will reduce timed up and go to <11 seconds to reduce fall risk and demonstrate improved transfer/gait ability. Baseline: 15.72 10/29: 9.97sec  12/4: 8.4 sec  Goal status: MET  6.  Patient will increase FGA score to >22 as to demonstrate reduced fall risk and improved dynamic gait balance for better safety with community/home ambulation.   Baseline:19 10/29 24 Goal status: MET   ASSESSMENT:  CLINICAL IMPRESSION: Pt put forth excellent effort for PT treatment. Pt approved for 5 additional PT visits. PT treatment Focused on functional strengthening including step up with reduced UE support, weighted sit<>stand with resistance bands and farmer/suitcase carry movement with BUE. Pt reports no pain, but requires intermittent rest breaks due to BLE fatigue.  Pt may continue to benefit from skilled PT pending additional authorization for PT treatment  to address balance, strength and ROM deficits, improve safety and QoL while continuing CA treatment.    OBJECTIVE IMPAIRMENTS: Abnormal gait, cardiopulmonary status limiting activity, decreased activity tolerance, decreased balance, decreased endurance, decreased knowledge of condition,  decreased mobility, difficulty walking, decreased ROM, decreased strength, hypomobility, increased fascial restrictions, impaired perceived functional ability, impaired flexibility, impaired UE functional use, improper body mechanics, postural dysfunction, and pain.   ACTIVITY LIMITATIONS: carrying, lifting, bending, standing, squatting, sleeping, stairs, transfers, bathing, toileting, dressing, reach over head, and locomotion level  PARTICIPATION LIMITATIONS: cleaning, laundry, driving, shopping, community activity, and yard work  PERSONAL FACTORS: Age, Behavior pattern, Fitness, Past/current experiences, and 1-2 comorbidities: CA with profound metastases   are also affecting patient's functional outcome.   REHAB POTENTIAL: Good  CLINICAL DECISION MAKING: Evolving/moderate complexity  EVALUATION COMPLEXITY: Moderate  PLAN:  PT FREQUENCY: 1-2x/week  PT DURATION: 12 weeks  PLANNED INTERVENTIONS: Therapeutic exercises, Therapeutic activity, Neuromuscular  re-education, Balance training, Gait training, Patient/Family education, Self Care, Joint mobilization, Stair training, DME instructions, Cryotherapy, Moist heat, and Manual therapy  PLAN FOR NEXT SESSION:  Re certification  Balance, strengthening for BLE/BUE.  Neck pain management as indicated.    Golden Pop, PT 02/24/2023, 3:05 PM

## 2023-02-25 ENCOUNTER — Ambulatory Visit: Payer: Medicaid Other | Admitting: Adult Health

## 2023-02-25 ENCOUNTER — Ambulatory Visit: Payer: Medicaid Other | Admitting: Hematology and Oncology

## 2023-02-25 ENCOUNTER — Other Ambulatory Visit: Payer: Self-pay | Admitting: Pharmacist

## 2023-02-25 ENCOUNTER — Ambulatory Visit: Payer: Medicaid Other

## 2023-02-25 ENCOUNTER — Other Ambulatory Visit: Payer: Medicaid Other

## 2023-02-25 DIAGNOSIS — Z170 Estrogen receptor positive status [ER+]: Secondary | ICD-10-CM

## 2023-03-01 ENCOUNTER — Ambulatory Visit: Payer: Medicaid Other | Admitting: Physical Therapy

## 2023-03-01 ENCOUNTER — Encounter: Payer: Medicaid Other | Admitting: Physical Therapy

## 2023-03-01 MED FILL — Fosaprepitant Dimeglumine For IV Infusion 150 MG (Base Eq): INTRAVENOUS | Qty: 5 | Status: AC

## 2023-03-02 ENCOUNTER — Other Ambulatory Visit: Payer: Self-pay | Admitting: *Deleted

## 2023-03-02 ENCOUNTER — Inpatient Hospital Stay: Payer: Medicaid Other | Admitting: Hematology and Oncology

## 2023-03-02 ENCOUNTER — Inpatient Hospital Stay: Payer: Medicaid Other | Attending: Hematology and Oncology

## 2023-03-02 ENCOUNTER — Inpatient Hospital Stay: Payer: Medicaid Other

## 2023-03-02 VITALS — BP 119/66 | HR 121 | Temp 97.7°F | Resp 18 | Ht 70.0 in | Wt 146.9 lb

## 2023-03-02 DIAGNOSIS — Z79899 Other long term (current) drug therapy: Secondary | ICD-10-CM | POA: Diagnosis not present

## 2023-03-02 DIAGNOSIS — Z9012 Acquired absence of left breast and nipple: Secondary | ICD-10-CM | POA: Insufficient documentation

## 2023-03-02 DIAGNOSIS — C799 Secondary malignant neoplasm of unspecified site: Secondary | ICD-10-CM

## 2023-03-02 DIAGNOSIS — Z170 Estrogen receptor positive status [ER+]: Secondary | ICD-10-CM | POA: Diagnosis not present

## 2023-03-02 DIAGNOSIS — Z9221 Personal history of antineoplastic chemotherapy: Secondary | ICD-10-CM | POA: Insufficient documentation

## 2023-03-02 DIAGNOSIS — C50512 Malignant neoplasm of lower-outer quadrant of left female breast: Secondary | ICD-10-CM | POA: Insufficient documentation

## 2023-03-02 DIAGNOSIS — Z923 Personal history of irradiation: Secondary | ICD-10-CM | POA: Insufficient documentation

## 2023-03-02 DIAGNOSIS — C7951 Secondary malignant neoplasm of bone: Secondary | ICD-10-CM | POA: Insufficient documentation

## 2023-03-02 DIAGNOSIS — Z5112 Encounter for antineoplastic immunotherapy: Secondary | ICD-10-CM | POA: Diagnosis present

## 2023-03-02 DIAGNOSIS — Z79811 Long term (current) use of aromatase inhibitors: Secondary | ICD-10-CM | POA: Diagnosis not present

## 2023-03-02 DIAGNOSIS — Z5181 Encounter for therapeutic drug level monitoring: Secondary | ICD-10-CM

## 2023-03-02 LAB — CBC WITH DIFFERENTIAL (CANCER CENTER ONLY)
Abs Immature Granulocytes: 0.02 10*3/uL (ref 0.00–0.07)
Basophils Absolute: 0 10*3/uL (ref 0.0–0.1)
Basophils Relative: 1 %
Eosinophils Absolute: 0.1 10*3/uL (ref 0.0–0.5)
Eosinophils Relative: 2 %
HCT: 40.2 % (ref 36.0–46.0)
Hemoglobin: 13.6 g/dL (ref 12.0–15.0)
Immature Granulocytes: 1 %
Lymphocytes Relative: 11 %
Lymphs Abs: 0.4 10*3/uL — ABNORMAL LOW (ref 0.7–4.0)
MCH: 35 pg — ABNORMAL HIGH (ref 26.0–34.0)
MCHC: 33.8 g/dL (ref 30.0–36.0)
MCV: 103.3 fL — ABNORMAL HIGH (ref 80.0–100.0)
Monocytes Absolute: 0.4 10*3/uL (ref 0.1–1.0)
Monocytes Relative: 11 %
Neutro Abs: 2.9 10*3/uL (ref 1.7–7.7)
Neutrophils Relative %: 74 %
Platelet Count: 259 10*3/uL (ref 150–400)
RBC: 3.89 MIL/uL (ref 3.87–5.11)
RDW: 16.7 % — ABNORMAL HIGH (ref 11.5–15.5)
WBC Count: 3.9 10*3/uL — ABNORMAL LOW (ref 4.0–10.5)
nRBC: 0 % (ref 0.0–0.2)

## 2023-03-02 LAB — CMP (CANCER CENTER ONLY)
ALT: 12 U/L (ref 0–44)
AST: 34 U/L (ref 15–41)
Albumin: 4 g/dL (ref 3.5–5.0)
Alkaline Phosphatase: 126 U/L (ref 38–126)
Anion gap: 7 (ref 5–15)
BUN: 7 mg/dL — ABNORMAL LOW (ref 8–23)
CO2: 25 mmol/L (ref 22–32)
Calcium: 9.5 mg/dL (ref 8.9–10.3)
Chloride: 106 mmol/L (ref 98–111)
Creatinine: 0.57 mg/dL (ref 0.44–1.00)
GFR, Estimated: >60 mL/min (ref >60)
Glucose, Bld: 94 mg/dL (ref 70–99)
Potassium: 4 mmol/L (ref 3.5–5.1)
Sodium: 138 mmol/L (ref 135–145)
Total Bilirubin: 0.4 mg/dL (ref <1.2)
Total Protein: 6.8 g/dL (ref 6.5–8.1)

## 2023-03-02 MED ORDER — DEXTROSE 5 % IV SOLN
Freq: Once | INTRAVENOUS | Status: AC
Start: 2023-03-02 — End: 2023-03-02

## 2023-03-02 MED ORDER — ACETAMINOPHEN 325 MG PO TABS
650.0000 mg | ORAL_TABLET | Freq: Once | ORAL | Status: AC
Start: 2023-03-02 — End: 2023-03-02
  Administered 2023-03-02: 650 mg via ORAL
  Filled 2023-03-02: qty 2

## 2023-03-02 MED ORDER — PALONOSETRON HCL INJECTION 0.25 MG/5ML
0.2500 mg | Freq: Once | INTRAVENOUS | Status: AC
  Administered 2023-03-02: 0.25 mg via INTRAVENOUS
  Filled 2023-03-02: qty 5

## 2023-03-02 MED ORDER — SODIUM CHLORIDE 0.9 % IV SOLN
150.0000 mg | Freq: Once | INTRAVENOUS | Status: AC
  Administered 2023-03-02: 150 mg via INTRAVENOUS
  Filled 2023-03-02: qty 150

## 2023-03-02 MED ORDER — DIPHENHYDRAMINE HCL 25 MG PO CAPS
50.0000 mg | ORAL_CAPSULE | Freq: Once | ORAL | Status: AC
  Administered 2023-03-02: 50 mg via ORAL
  Filled 2023-03-02: qty 2

## 2023-03-02 MED ORDER — DEXAMETHASONE SODIUM PHOSPHATE 10 MG/ML IJ SOLN
10.0000 mg | Freq: Once | INTRAMUSCULAR | Status: AC
  Administered 2023-03-02: 10 mg via INTRAVENOUS
  Filled 2023-03-02: qty 1

## 2023-03-02 MED ORDER — FAM-TRASTUZUMAB DERUXTECAN-NXKI CHEMO 100 MG IV SOLR
3.2000 mg/kg | Freq: Once | INTRAVENOUS | Status: AC
  Administered 2023-03-02: 200 mg via INTRAVENOUS
  Filled 2023-03-02: qty 10

## 2023-03-02 MED ORDER — DENOSUMAB 120 MG/1.7ML ~~LOC~~ SOLN
120.0000 mg | Freq: Once | SUBCUTANEOUS | Status: DC
Start: 2023-03-02 — End: 2023-03-02

## 2023-03-02 NOTE — Patient Instructions (Signed)
 [facility] - A DEPT OF [facility]  Discharge Instructions: Thank you for choosing [facility] to provide your oncology and hematology care.   If you have a lab appointment with the Cancer Center, please go directly to the Cancer Center and check in at the registration area.   Wear comfortable clothing and clothing appropriate for easy access to any Portacath or PICC line.   We strive to give you quality time with your provider. You may need to reschedule your appointment if you arrive late (15 or more minutes).  Arriving late affects you and other patients whose appointments are after yours.  Also, if you miss three or more appointments without notifying the office, you may be dismissed from the clinic at the provider's discretion.      For prescription refill requests, have your pharmacy contact our office and allow 72 hours for refills to be completed.    Today you received the following chemotherapy and/or immunotherapy agents: fam-trastuzumab [id number]      To help prevent nausea and vomiting after your treatment, we encourage you to take your nausea medication as directed.  BELOW ARE SYMPTOMS THAT SHOULD BE REPORTED IMMEDIATELY: *FEVER GREATER THAN 100.4 F (38 C) OR HIGHER *CHILLS OR SWEATING *NAUSEA AND VOMITING THAT IS NOT CONTROLLED WITH YOUR NAUSEA MEDICATION *UNUSUAL SHORTNESS OF BREATH *UNUSUAL BRUISING OR BLEEDING *URINARY PROBLEMS (pain or burning when urinating, or frequent urination) *BOWEL PROBLEMS (unusual diarrhea, constipation, pain near the anus) TENDERNESS IN MOUTH AND THROAT WITH OR WITHOUT PRESENCE OF ULCERS (sore throat, sores in mouth, or a toothache) UNUSUAL RASH, SWELLING OR PAIN  UNUSUAL VAGINAL DISCHARGE OR ITCHING   Items with * indicate a potential emergency and should be followed up as soon as possible or go to the Emergency Department if any problems should occur.  Please show the CHEMOTHERAPY ALERT  CARD or IMMUNOTHERAPY ALERT CARD at check-in to the Emergency Department and triage nurse.  Should you have questions after your visit or need to cancel or reschedule your appointment, please contact [facility] - A DEPT OF [facility]  Dept: [phone]  and follow the prompts.  Office hours are 8:00 a.m. to 4:30 p.m. Monday - Friday. Please note that voicemails left after 4:00 p.m. may not be returned until the following business day.  We are closed weekends and major holidays. You have access to a nurse at all times for urgent questions. Please call the main number to the clinic Dept: [phone] and follow the prompts.   For any non-urgent questions, you may also contact your provider using MyChart. We now offer e-Visits for anyone 32 and older to request care online for non-urgent symptoms. For details visit [email or web address].   Also download the MyChart app! Go to the app store, search "MyChart", open the app, select [facility], and log in with your MyChart username and password.

## 2023-03-02 NOTE — Progress Notes (Signed)
Patient Care Team: Ollen Bowl, MD as PCP - General (Internal Medicine) [name], MD as Consulting Physician (Hematology and Oncology) Almond Lint, MD as Consulting Physician (General Surgery) Dorothy Puffer, MD as Consulting Physician (Radiation Oncology) Carmina Miller, MD as Consulting Physician (Radiation Oncology)  DIAGNOSIS:  Encounter Diagnosis  Name Primary?   Malignant neoplasm of lower-outer quadrant of left breast of female, estrogen receptor positive (HCC) Yes    SUMMARY OF ONCOLOGIC HISTORY: Oncology History  Malignant neoplasm of lower-outer quadrant of left breast of female, estrogen receptor positive (HCC)  06/11/2016 Mammogram   Palpable left breast masses 3:00 position: 2.2 cm; 5:30 position: 2.5 cm; 6:30 position: 0.7 cm   06/19/2016 Initial Diagnosis   Left breast biopsy 3:30: IDC with DCIS grade 1, ER 90%, PR 50%, Ki-67 15%, HER-2 negative ratio 1.13; biopsy 5:30 position: IDC grade 1   07/13/2016 Breast MRI   Large area of abnormal enhancement lower inner and lower outer quadrants left breast spanning 9 cm x 6.4 cm x 5.3 cm, no abnormal enlarged lymph nodes; T3 N0 stage II a (New AJCC staging)    07/15/2016 - 12/08/2017 Anti-estrogen oral therapy   Neoadjuvant anastrozole 1 mg daily   07/17/2016 Oncotype testing   Testing done on the biopsy: Oncotype DX score 22, intermediate risk   02/02/2017 Breast MRI   Left breast multicentric disease unchanged measuring 2.7 x 1.6 cm.  Mass in the non-mass enhancement are also not significantly changed measuring 6.2 x 2.4 cm. new enhancing mass within the outer right breast 7 mm which could be fat necrosis or inclusion cyst    02/09/2017 Imaging   Ultrasound of the right breast lesion noted on MRI: No sonographic finding corresponds to the abnormality noted on MRI   07/13/2017 Cancer Staging   Staging form: Breast, AJCC 8th Edition - Clinical stage from 07/13/2017: Stage IIA (cT3, cN0, cM0, G1, ER+, PR+, HER2-) -  Signed by Loa Socks, NP on 05/18/2018   10/26/2017 Surgery   Left mastectomy: IDC grade 1, 2 foci largest spans 8.5 cm, intermediate grade DCIS, lymphovascular invasion identified, perineural invasion identified, 1/2 lymph nodes positive with extracapsular extension, ER 9200%, PR 5 to 50%, HER-2 negative, Ki-67 10 to 15%, T3N1A Mammaprint: low risk   11/02/2017 Cancer Staging   Staging form: Breast, AJCC 8th Edition - Pathologic: No Stage Recommended (ypT3, pN1a, cM0, G1, ER+, PR+, HER2-) - Signed by [name], MD on 11/02/2017   12/08/2017 - 01/26/2018 Radiation Therapy   Adjuvant radiation therapy    02/2018 -  Anti-estrogen oral therapy   Anastrozole 1 mg daily adjuvant therapy   10/21/2022 -  Chemotherapy   Patient is on Treatment Plan : BREAST METASTATIC Fam-Trastuzumab Deruxtecan-nxki (Enhertu) (5.4) q21d       CHIEF COMPLIANT: F/U on Enhertu to review scans  HISTORY OF PRESENT ILLNESS:   History of Present Illness   The patient, with a 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 She has been on a regimen of Enhertu and has been responding well to treatment. The patient reports a significant reduction in the size of the liver lesion and improvement in the bone lesions, which are now showing signs of scarring. The patient also reports an improvement in her overall strength and energy levels, allowing her to engage in activities such as vacuuming and moving furniture. However, the patient has been experiencing issues with her voice and difficulty swallowing, which she attributes to previous  radiation therapy. The patient also reports a history of allergies and uses almond milk and cheese in her diet.         ALLERGIES:  is allergic to percocet [oxycodone-acetaminophen].  MEDICATIONS:  Current Outpatient Medications  Medication Sig Dispense Refill   azelastine (ASTELIN) 0.1 % nasal spray Place into both nostrils 2 (two)  times daily. (Patient not taking: Reported on 11/03/2022)     Calcium 500-100 MG-UNIT CHEW Chew 1 tablet by mouth daily. 60 tablet    cetirizine (ZYRTEC) 10 MG tablet Take by mouth. (Patient not taking: Reported on 11/03/2022)     cholecalciferol (VITAMIN D3) 25 MCG (1000 UNIT) tablet Take 1 tablet (1,000 Units total) by mouth daily. (Patient not taking: Reported on 11/03/2022)     dexamethasone (DECADRON) 4 MG tablet Take 1 tab for 2 days after chemo (Patient not taking: Reported on 12/28/2022) 30 tablet 1   furosemide (LASIX) 20 MG tablet Take 1 tablet (20 mg total) by mouth daily. If needed may take 1/2 tab q am 30 tablet 0   levothyroxine (SYNTHROID) 112 MCG tablet Take 1 tablet by mouth daily.     LORazepam (ATIVAN) 0.5 MG tablet Take 1 tablet (0.5 mg total) by mouth at bedtime. (Patient not taking: Reported on 11/03/2022) 30 tablet 0   omeprazole (PRILOSEC) 20 MG capsule Take 1 capsule (20 mg total) by mouth daily. 30 capsule 3   ondansetron (ZOFRAN) 8 MG tablet Take 1 tablet (8 mg total) by mouth every 8 (eight) hours as needed for nausea or vomiting. Start on the third day after chemotherapy. (Patient not taking: Reported on 11/03/2022) 30 tablet 1   pantoprazole (PROTONIX) 40 MG tablet 1 tablet Orally Once a day for 30 days Take 1 tablet twice daily for 2 weeks then take 1 tablet daily. (Patient not taking: Reported on 12/28/2022)     prochlorperazine (COMPAZINE) 10 MG tablet Take 1 tablet (10 mg total) by mouth every 6 (six) hours as needed for nausea or vomiting. (Patient not taking: Reported on 11/03/2022) 30 tablet 1   sucralfate (CARAFATE) 1 g tablet TAKE 1 TABLET BY MOUTH 4 TIMES DAILY WITH MEALS AND AT BEDTIME. DISSOLVE IN WARM WATER, SWISH AND SWALLOW. 120 tablet 1   vitamin C (ASCORBIC ACID) 250 MG tablet Take 1 tablet (250 mg total) by mouth daily.     No current facility-administered medications for this visit.    PHYSICAL EXAMINATION: ECOG PERFORMANCE STATUS: 1 - Symptomatic but  completely ambulatory  Vitals:   03/02/23 1337  BP: 119/66  Pulse: (!) 121  Resp: 18  Temp: 97.7 F (36.5 C)  SpO2: 100%   Filed Weights   03/02/23 1337  Weight: 146 lb 14.4 oz (66.6 kg)     LABORATORY DATA:  I have reviewed the data as listed    Latest Ref Rng & Units 03/02/2023    1:25 PM 02/04/2023    1:17 PM 01/14/2023    1:21 PM  CMP  Glucose 70 - 99 mg/dL 94  93  409   BUN 8 - 23 mg/dL 7  11  8    Creatinine 0.44 - 1.00 mg/dL 8.11  9.14  7.82   Sodium 135 - 145 mmol/L 138  138  137   Potassium 3.5 - 5.1 mmol/L 4.0  3.9  4.2   Chloride 98 - 111 mmol/L 106  106  106   CO2 22 - 32 mmol/L 25  26  26    Calcium 8.9 - 10.3 mg/dL  9.5  9.7  9.0   Total Protein 6.5 - 8.1 g/dL 6.8  7.0  6.4   Total Bilirubin <1.2 mg/dL 0.4  0.3  0.3   Alkaline Phos 38 - 126 U/L 126  125  127   AST 15 - 41 U/L 34  21  19   ALT 0 - 44 U/L 12  9  8      Lab Results  Component Value Date   WBC 3.9 (L) 03/02/2023   HGB 13.6 03/02/2023   HCT 40.2 03/02/2023   MCV 103.3 (H) 03/02/2023   PLT 259 03/02/2023   NEUTROABS 2.9 03/02/2023    ASSESSMENT & PLAN:  Malignant neoplasm of lower-outer quadrant of left breast of female, estrogen receptor positive (HCC) 10/26/17: Left mastectomy: IDC grade 1, 2 foci largest spans 8.5 cm, intermediate grade DCIS, lymphovascular invasion identified, perineural invasion identified, 1/2 lymph nodes positive with extracapsular extension, ER 9200%, PR 5 to 50%, HER-2 negative, Ki-67 10 to 15%, T3N1A   Oncotype DX score 22, intermediate risk, chemotherapy not felt to have significant benefit.   Treatment Summary: 1. Antiestrogen therapy with anastrozole 1 mg daily started 07/15/2016 2. Mastectomy 10/26/2017, Mammaprint low risk luminal type A 3. Followed by adjuvant radiation 12/08/17- 01/26/18  4. Followed by adjuvant antiestrogen therapy anastrozole started 01/17/2018 (originally started  07/15/2016) -------------------------------------------------------------------- Low back pain August 2023: Underwent CT myelogram: Large expansile lesion in the sacrum with extraosseous extension of the tumor, diffuse lytic lesions throughout the visualized spine with metastatic disease myeloma is considered less likely.  (This was ordered by Dr. Marcene Corning)   Treatment plan: 1.  PET CT scan 12/27/2021: Widespread bone metastatic disease largest lesion involving the sacrum with pathological fractures of T6 and T10 retroperitoneal and pelvic lymph node metastasis, right axillary lymph node, activity in the pancreatic head, hypermetabolic activity in the adrenal glands, right thyroid nodule. 2. biopsy of sacrum: 12/26/2021: Metastatic breast cancer, ER 90%, PR 10%, HER2 negative (0) 3.  Ibrance along with Faslodex started 12/25/2021-10/14/2022 4.  Xgeva for bone metastases.  Every 3 months 5.  Palliative radiation to the sacrum completed 01/19/2022 ---------------------------------------------------------------------------------------------------------------------- Current treatment: Enhertu started 10/21/2022, today is cycle 6 We plan to continue this treatment irrespective of her blood counts.   Enhertu toxicities: Tolerated extremely well without any problems. Completed radiation   Liver function tests have started to improve.   PET/CT 10/29/2022: Overall improved appearance of bone lesions.  Stable right extra lymph node, retroperitoneal lymph nodes decreased activity CT head ordered by Dr. 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  Unchanged innumerable scalp nodules CT CAP 02/24/2023: Liver lesions diminished in size sclerosis of widespread bone metastases  Overall this is excellent response to treatment.  Vocal Cord  Dysfunction Patient experiencing low voice volume, likely secondary to radiation treatment. -Refer to ENT for evaluation and potential treatment options. -Consider referral to GI for esophageal dilation if symptoms persist.  General Health Maintenance -Continue calcium and vitamin D supplementation for bone health. -Follow-up in three weeks for continued monitoring of cancer treatment response.      Return to clinic every 3 weeks for Enhertu treatment.    No orders of the defined types were placed in this encounter.  The patient has a good understanding of the overall plan. she agrees with it. she will call with any problems that may develop before  the next visit here. Total time spent: 30 mins including face to face time and time spent for planning, charting and co-ordination of care   Tamsen Meek, MD 03/02/23

## 2023-03-02 NOTE — Progress Notes (Signed)
Per MD okay to treat today with echo from 10/16/2022 and HR of 124.  Verbal orders received and placed to obtain repeat echo prior to next tx.

## 2023-03-02 NOTE — Assessment & Plan Note (Signed)
10/26/17: Left mastectomy: IDC grade 1, 2 foci largest spans 8.5 cm, intermediate grade DCIS, lymphovascular invasion identified, perineural invasion identified, 1/2 lymph nodes positive with extracapsular extension, ER 9200%, PR 5 to 50%, HER-2 negative, Ki-67 10 to 15%, T3N1A   Oncotype DX score 22, intermediate risk, chemotherapy not felt to have significant benefit.   Treatment Summary: 1. Antiestrogen therapy with anastrozole 1 mg daily started 07/15/2016 2. Mastectomy 10/26/2017, Mammaprint low risk luminal type A 3. Followed by adjuvant radiation 12/08/17- 01/26/18  4. Followed by adjuvant antiestrogen therapy anastrozole started 01/17/2018 (originally started 07/15/2016) -------------------------------------------------------------------- Low back pain August 2023: Underwent CT myelogram: Large expansile lesion in the sacrum with extraosseous extension of the tumor, diffuse lytic lesions throughout the visualized spine with metastatic disease myeloma is considered less likely.  (This was ordered by Dr. Marcene Corning)   Treatment plan: 1.  PET CT scan 12/27/2021: Widespread bone metastatic disease largest lesion involving the sacrum with pathological fractures of T6 and T10 retroperitoneal and pelvic lymph node metastasis, right axillary lymph node, activity in the pancreatic head, hypermetabolic activity in the adrenal glands, right thyroid nodule. 2. biopsy of sacrum: 12/26/2021: Metastatic breast cancer, ER 90%, PR 10%, HER2 negative (0) 3.  Ibrance along with Faslodex started 12/25/2021-10/14/2022 4.  Xgeva for bone metastases.  Every 3 months 5.  Palliative radiation to the sacrum completed 01/19/2022 ---------------------------------------------------------------------------------------------------------------------- Current treatment: Enhertu started 10/21/2022, today is cycle 6 We plan to continue this treatment irrespective of her blood counts.   Enhertu toxicities: Tolerated extremely  well without any problems. Completed radiation   Liver function tests have started to improve.   PET/CT 10/29/2022: Overall improved appearance of bone lesions.  Stable right extra lymph node, retroperitoneal lymph nodes decreased activity CT head ordered by Dr. 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  Unchanged innumerable scalp nodules CT CAP 02/24/2023: Liver lesions diminished in size sclerosis of widespread bone metastases  Overall this is excellent response to treatment.   Return to clinic every 3 weeks for Enhertu treatment.

## 2023-03-03 ENCOUNTER — Ambulatory Visit: Payer: Medicaid Other | Admitting: Physical Therapy

## 2023-03-09 ENCOUNTER — Telehealth: Payer: Self-pay

## 2023-03-09 ENCOUNTER — Ambulatory Visit: Payer: Medicaid Other | Admitting: Physical Therapy

## 2023-03-09 DIAGNOSIS — I427 Cardiomyopathy due to drug and external agent: Secondary | ICD-10-CM

## 2023-03-09 NOTE — Telephone Encounter (Signed)
Pt Lvm wanting to change the location of her echo. I called pt to verify which one she wanted to attend. Patient wanted to go to [facility]. I put in a new order for her. Pt verbalized understanding.

## 2023-03-16 ENCOUNTER — Telehealth: Payer: Self-pay | Admitting: *Deleted

## 2023-03-16 ENCOUNTER — Ambulatory Visit: Payer: Medicaid Other | Admitting: Physical Therapy

## 2023-03-16 DIAGNOSIS — R262 Difficulty in walking, not elsewhere classified: Secondary | ICD-10-CM

## 2023-03-16 DIAGNOSIS — M6281 Muscle weakness (generalized): Secondary | ICD-10-CM | POA: Diagnosis not present

## 2023-03-16 DIAGNOSIS — M25611 Stiffness of right shoulder, not elsewhere classified: Secondary | ICD-10-CM

## 2023-03-16 DIAGNOSIS — M436 Torticollis: Secondary | ICD-10-CM

## 2023-03-16 DIAGNOSIS — M542 Cervicalgia: Secondary | ICD-10-CM

## 2023-03-16 NOTE — Telephone Encounter (Signed)
 Patient called to move January appt out 1 week due to transportation issues. Message sent to scheduler. Patient will wait for the return call from scheduling to confirm these changes.   FYI to MD sent.

## 2023-03-16 NOTE — Therapy (Signed)
 OUTPATIENT PHYSICAL THERAPY TREATMENT    Patient Name: Kathryn Lucas MRN: [id number] DOB:October 21, 1960, 63 y.o., female Today's Date: 03/16/2023   PCP: Vernon Velna SAUNDERS, MD  REFERRING PROVIDER: Odean Potts, MD   END OF SESSION:  PT End of Session - 03/16/23 1112     Visit Number 19    Number of Visits 36    Date for PT Re-Evaluation 06/08/23    Authorization Type [place] Medicaid    Authorization Time Period 03/16/23-06/08/23    Progress Note Due on Visit 20    PT Start Time 1105    PT Stop Time 1145    PT Time Calculation (min) 40 min    Equipment Utilized During Treatment Gait belt    Activity Tolerance Patient tolerated treatment well;No increased pain    Behavior During Therapy [facility] for tasks assessed/performed             Past Medical History:  Diagnosis Date   Breast cancer (HCC)    left breast cancer   Cancer (HCC) 09/2017   left breast cancer   Family history of breast cancer    Hypothyroidism    Personal history of radiation therapy 2019   Thyroid  disease    Past Surgical History:  Procedure Laterality Date   BREAST BIOPSY Left 2018   BREAST BIOPSY Left 2019   BREAST RECONSTRUCTION WITH PLACEMENT OF TISSUE EXPANDER AND ALLODERM Left 10/26/2017   Procedure: LEFT BREAST RECONSTRUCTION WITH PLACEMENT OF TISSUE EXPANDER AND ALLODERM;  Surgeon: Arelia Filippo, MD;  Location: [facility];  Service: Plastics;  Laterality: Left;   MASTECTOMY Left 2019   MASTECTOMY WITH RADIOACTIVE SEED GUIDED EXCISION AND AXILLARY SENTINEL LYMPH NODE BIOPSY Left 10/26/2017   Procedure: LEFT MASTECTOMY WITH SEED TARGETED  LEFT AXILLARY LYMPH NODE EXCISION AND LEFT SENTINEL LYMPH NODE BIOPSY;  Surgeon: Aron Shoulders, MD;  Location: [facility];  Service: General;  Laterality: Left;   TONSILLECTOMY     WISDOM TOOTH EXTRACTION     Patient Active Problem List   Diagnosis Date Noted   Metastatic 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) 10/14/2022   Elevated liver enzymes  10/14/2022   Unexplained weight loss 10/14/2022   Neoplasm related pain 10/14/2022   Metastatic malignant neoplasm (HCC) 12/19/2021   Family history of breast cancer    History of therapeutic radiation 04/27/2018   Breast cancer, left breast (HCC) 10/26/2017   Malignant neoplasm of lower-outer quadrant of left breast of female, estrogen receptor positive (HCC) 07/15/2016   Plantar fasciitis 07/30/2015   Metatarsalgia 10/18/2014    ONSET DATE: September 2023   REFERRING DIAG:  Diagnosis  C79.51 (ICD-10-CM) - Secondary malignant neoplasm of bone    THERAPY DIAG:  Muscle weakness (generalized)  Stiffness of cervical spine  Difficulty in walking, not elsewhere classified  Decreased range of motion of right shoulder  Painful cervical ROM  Rationale for Evaluation and Treatment: Rehabilitation  SUBJECTIVE:  SUBJECTIVE STATEMENT: Pt reports  her recent CT shows cancer levels have decreased. She is doing generally well. Went to [place] for a meal.   PERTINENT HISTORY:   Pt is 62 y.o. female that is seeking therapy for weakness of the R LE. Pt tripped over the threshold of her door back in September of 2023, and had a transverse fracture of the L patella when she fell on it. She was recently diagnosed with bone cancer back in October of 2023. Pt notes that she has more stiffness in the neck and pelvis region because the cancer is located in those locations. Pt had exacerbation of s/s in June of 2024 with hospitalization and subsequent change in oncology treatment limiting ability to attend PT. Pt has responded well to treatment and now able to attend PT.   PAIN:  Are you having pain? No   PRECAUTIONS: Cervical and Other: cervical for comfort   RED FLAGS: None   WEIGHT BEARING RESTRICTIONS:  No  FALLS: Has patient fallen in last 6 months? Yes. Number of falls 1  LIVING ENVIRONMENT: Lives with: lives alone Lives in: House/apartment Stairs:  yes, but uses ramp  Has following equipment at home: Walker - 4 wheeled  PLOF: Needs assistance with ADLs and Needs assistance with homemaking  PATIENT GOALS: improve balance, strength and endurance. Stand at kitchen longer and   OBJECTIVE:   DIAGNOSTIC FINDINGS:  COGNITION: Overall cognitive status: Within functional limits for tasks assessed   SENSATION: WFL   LOWER EXTREMITY MMT:   MMT  Right Eval Left Eval  Hip flexion 4 4  Hip extension    Hip abduction 4 4  Hip adduction 4+ 4+  Hip internal rotation    Hip external rotation    Knee flexion 4 4  Knee extension 4+ 4+  Ankle dorsiflexion 5 5  Ankle plantarflexion 4 4  Ankle inversion    Ankle eversion     (Blank rows = not tested)    TODAY'S TREATMENT:                                                                                                                              DATE: 03/16/2023  -STS from chair + airex x10, hands free  -seated brace marching x40, alternating  -STS from chair + airex x12 hands free -seated cervical rotation + self overpressure (pt raises concerns of causing pain in neck, so we do something else)  -side stepping along bar, no LOB -AMB to wellzone (supervision level)  -leg press (to work on lower surface transfers) seat 9, L3 2x10 (rest between)  -Suit case carry x 124ft with 9# dumbbells -UBE 2 min forward/2 min reverse. 1 min seated rest break -lateral stepup (6 step) 2 hand support x20 (10 each side)    Throughout session pt required occasional seated rest breaks due to mild SOB and LE fatigue. No LOB throughout session and reduced use of BUE with step ups  compared to prior sessions with this PT.  PATIENT EDUCATION: Education details: Pt educated throughout session about proper posture and technique with exercises.  Improved exercise technique, movement at target joints, use of target muscles after min to mod verbal, visual, tactile cues.  Person educated: Patient  Education method: Explanation Education comprehension: verbalized understanding  HOME EXERCISE PROGRAM: Access Code: [email or web address] URL: [email or web address] Date: 02/18/2023 Prepared by: Massie Dollar  Exercises - Mini Squat with Counter Support  - 1 x daily - 7 x weekly - 3 sets - 10 reps - Standing Hip Abduction with Counter Support  - 1 x daily - 7 x weekly - 3 sets - 10 reps - Standing March with Counter Support  - 1 x daily - 7 x weekly - 3 sets - 10 reps - Seated Long Arc Quad  - 1 x daily - 7 x weekly - 3 sets - 10 reps - Sidelying Hip Abduction  - 1 x daily - 7 x weekly - 3 sets - 10 reps - Supine Active Straight Leg Raise  - 1 x daily - 7 x weekly - 3 sets - 10 reps - Supine Quad Set  - 1 x daily - 7 x weekly - 3 sets - 10 reps - Clamshell  - 1 x daily - 7 x weekly - 3 sets - 10 reps - Seated Hip Abduction with Resistance  - 1 x daily - 7 x weekly - 3 sets - 10 reps - Seated Knee Extension with Resistance  - 1 x daily - 7 x weekly - 3 sets - 10 reps - Seated March with Resistance  - 1 x daily - 7 x weekly - 3 sets - 10 reps - Seated Heel Toe Raises  - 1 x daily - 7 x weekly - 3 sets - 10 reps - Seated Ankle Plantarflexion with Resistance  - 1 x daily - 7 x weekly - 3 sets - 10 reps  GOALS: Goals reviewed with patient? Yes  SHORT TERM GOALS: Target date: 01/13/2023   Patient will be independent in home exercise program to improve strength/mobility for better functional independence with ADLs. Baseline:to  given 9/30  10/29. Pt reports inconsistent HEP completion. Goal status: IN PROGRESS   LONG TERM GOALS: Target date: 03/03/2023    Patient will increase FOTO score to equal to or greater than  68   to demonstrate statistically significant improvement in mobility and quality of life.  Baseline: 63  10/29:  74 02/17/2023: 78  Goal status: MET  2.  Patient (> 16 years old) will complete five times sit to stand test in < 15 seconds without use of BUE indicating an increased LE strength and improved balance. Baseline: 15.82 with UE support  10/29: 13.84 with UE support push from seat  12/4: 11.84sec pushing from thighs.  Goal status: MET; revised   3.  Patient will increase Berg Balance score by > 6 points to demonstrate decreased fall risk during functional activities Baseline: 46; 10/29: 49; 12/4: 52 Goal status: MET  4.  Patient will increase 6 min walk test to >1435ft as to improve gait speed for improved ability to safely navigate the community for IADL.  Baseline: 950; 10/29 1129ft; 12/4: 1150  Goal status: IN PROGRESS ; revised   5.  Patient will reduce timed up and go to <11 seconds to reduce fall risk and demonstrate improved transfer/gait ability. Baseline: 15.72 10/29: 9.97sec  12/4: 8.4 sec  Goal status: MET  6.  Patient will increase FGA score to >22 as to demonstrate reduced fall risk and improved dynamic gait balance for better safety with community/home ambulation.  Baseline:19 10/29 24 Goal status: MET   ASSESSMENT:  CLINICAL IMPRESSION: Pt returns after brief hiatus following cancer treatment, able to pick back up with program as recently performed without any signs of lost function. Pt remains easily exerted with DOE, frequent recovery breaks taken as needed. Pt reports strength and balance are improving but stamina remains the biggest area of remaining impairment.  Pt put forth excellent effort for PT treatment. Pt may continue to benefit from skilled PT pending additional authorization for PT treatment  to address balance, strength and ROM deficits, improve safety and QoL while continuing CA treatment.    OBJECTIVE IMPAIRMENTS: Abnormal gait, cardiopulmonary status limiting activity, decreased activity tolerance, decreased balance, decreased endurance, decreased  knowledge of condition, decreased mobility, difficulty walking, decreased ROM, decreased strength, hypomobility, increased fascial restrictions, impaired perceived functional ability, impaired flexibility, impaired UE functional use, improper body mechanics, postural dysfunction, and pain.   ACTIVITY LIMITATIONS: carrying, lifting, bending, standing, squatting, sleeping, stairs, transfers, bathing, toileting, dressing, reach over head, and locomotion level  PARTICIPATION LIMITATIONS: cleaning, laundry, driving, shopping, community activity, and yard work  PERSONAL FACTORS: Age, Behavior pattern, Fitness, Past/current experiences, and 1-2 comorbidities: CA with profound metastases   are also affecting patient's functional outcome.   REHAB POTENTIAL: Good  CLINICAL DECISION MAKING: Evolving/moderate complexity  EVALUATION COMPLEXITY: Moderate  PLAN:  PT FREQUENCY: 1-2x/week  PT DURATION: 12 weeks  PLANNED INTERVENTIONS: Therapeutic exercises, Therapeutic activity, Neuromuscular re-education, Balance training, Gait training, Patient/Family education, Self Care, Joint mobilization, Stair training, DME instructions, Cryotherapy, Moist heat, and Manual therapy  PLAN FOR NEXT SESSION:  Balance, strengthening for BLE/BUE.  Neck pain management as indicated.    Happy Ky C, PT 03/16/2023, 12:06 PM  12:06 PM, 03/16/23 Peggye JAYSON Linear, PT, DPT Physical Therapist - [place] [facility]  Outpatient Physical Therapy- Main Campus [phone]

## 2023-03-19 ENCOUNTER — Other Ambulatory Visit: Payer: Self-pay | Admitting: Pharmacist

## 2023-03-19 ENCOUNTER — Ambulatory Visit (HOSPITAL_COMMUNITY)
Admission: RE | Admit: 2023-03-19 | Discharge: 2023-03-19 | Disposition: A | Discharge status: Home / Self Care | Payer: Medicaid Other | Source: Ambulatory Visit | Attending: Hematology and Oncology | Admitting: Hematology and Oncology

## 2023-03-19 ENCOUNTER — Encounter: Payer: Self-pay | Admitting: Hematology and Oncology

## 2023-03-19 DIAGNOSIS — E785 Hyperlipidemia, unspecified: Secondary | ICD-10-CM | POA: Diagnosis not present

## 2023-03-19 DIAGNOSIS — I517 Cardiomegaly: Secondary | ICD-10-CM | POA: Insufficient documentation

## 2023-03-19 DIAGNOSIS — Z79899 Other long term (current) drug therapy: Secondary | ICD-10-CM | POA: Diagnosis not present

## 2023-03-19 DIAGNOSIS — Z170 Estrogen receptor positive status [ER+]: Secondary | ICD-10-CM

## 2023-03-19 DIAGNOSIS — Z5181 Encounter for therapeutic drug level monitoring: Secondary | ICD-10-CM | POA: Insufficient documentation

## 2023-03-19 DIAGNOSIS — C50919 Malignant neoplasm of unspecified site of unspecified female breast: Secondary | ICD-10-CM | POA: Insufficient documentation

## 2023-03-19 LAB — ECHOCARDIOGRAM COMPLETE
AR max vel: 2.9 cm2
AV Area VTI: 2.92 cm2
AV Area mean vel: 3 cm2
AV Mean grad: 1 mm[Hg]
AV Peak grad: 2.5 mm[Hg]
Ao pk vel: 0.79 m/s
Area-P 1/2: 5.66 cm2
S' Lateral: 2.6 cm

## 2023-03-19 NOTE — Progress Notes (Signed)
 Patient contacted Camie Lauth RN asking for treatment plan to be deferred to 03/30/23 due to transportation issues. Camie asked clinical pharmacy if treatment plan could be adjusted. Treatment plan deferred to 03/30/23 per patient request and team  notified to adjust scheduled appts.   Patient then called back asking if treatment plan dates could be moved back to 03/23/23. Treatment plan dates adjusted back accordingly. Scheduling and nursing notified.

## 2023-03-19 NOTE — Progress Notes (Signed)
*  PRELIMINARY RESULTS* Echocardiogram 2D Echocardiogram has been performed.  Kathryn Lucas 03/19/2023, 11:48 AM

## 2023-03-22 ENCOUNTER — Ambulatory Visit: Payer: Medicaid Other

## 2023-03-23 ENCOUNTER — Ambulatory Visit: Payer: Medicaid Other | Admitting: Hematology and Oncology

## 2023-03-23 ENCOUNTER — Ambulatory Visit: Payer: Medicaid Other | Admitting: Nurse Practitioner

## 2023-03-23 ENCOUNTER — Inpatient Hospital Stay: Payer: Medicaid Other | Attending: Hematology and Oncology

## 2023-03-23 ENCOUNTER — Other Ambulatory Visit: Payer: Medicaid Other

## 2023-03-23 ENCOUNTER — Ambulatory Visit: Payer: Medicaid Other

## 2023-03-23 ENCOUNTER — Inpatient Hospital Stay: Payer: Medicaid Other | Admitting: Hematology and Oncology

## 2023-03-23 ENCOUNTER — Inpatient Hospital Stay: Payer: Medicaid Other

## 2023-03-23 VITALS — HR 98

## 2023-03-23 VITALS — BP 125/81 | HR 116 | Temp 98.1°F | Resp 18 | Ht 70.0 in | Wt 149.8 lb

## 2023-03-23 DIAGNOSIS — C799 Secondary malignant neoplasm of unspecified site: Secondary | ICD-10-CM

## 2023-03-23 DIAGNOSIS — C7951 Secondary malignant neoplasm of bone: Secondary | ICD-10-CM | POA: Insufficient documentation

## 2023-03-23 DIAGNOSIS — Z5112 Encounter for antineoplastic immunotherapy: Secondary | ICD-10-CM | POA: Insufficient documentation

## 2023-03-23 DIAGNOSIS — C50512 Malignant neoplasm of lower-outer quadrant of left female breast: Secondary | ICD-10-CM

## 2023-03-23 DIAGNOSIS — Z79899 Other long term (current) drug therapy: Secondary | ICD-10-CM | POA: Diagnosis not present

## 2023-03-23 DIAGNOSIS — Z9012 Acquired absence of left breast and nipple: Secondary | ICD-10-CM | POA: Insufficient documentation

## 2023-03-23 DIAGNOSIS — Z170 Estrogen receptor positive status [ER+]: Secondary | ICD-10-CM

## 2023-03-23 DIAGNOSIS — Z79811 Long term (current) use of aromatase inhibitors: Secondary | ICD-10-CM | POA: Diagnosis not present

## 2023-03-23 DIAGNOSIS — Z9221 Personal history of antineoplastic chemotherapy: Secondary | ICD-10-CM | POA: Diagnosis not present

## 2023-03-23 DIAGNOSIS — Z923 Personal history of irradiation: Secondary | ICD-10-CM | POA: Insufficient documentation

## 2023-03-23 LAB — CMP (CANCER CENTER ONLY)
ALT: 10 U/L (ref 0–44)
AST: 26 U/L (ref 15–41)
Albumin: 3.9 g/dL (ref 3.5–5.0)
Alkaline Phosphatase: 95 U/L (ref 38–126)
Anion gap: 6 (ref 5–15)
BUN: 14 mg/dL (ref 8–23)
CO2: 23 mmol/L (ref 22–32)
Calcium: 8.7 mg/dL — ABNORMAL LOW (ref 8.9–10.3)
Chloride: 108 mmol/L (ref 98–111)
Creatinine: 0.58 mg/dL (ref 0.44–1.00)
GFR, Estimated: >60 mL/min (ref >60)
Glucose, Bld: 111 mg/dL — ABNORMAL HIGH (ref 70–99)
Potassium: 4.2 mmol/L (ref 3.5–5.1)
Sodium: 137 mmol/L (ref 135–145)
Total Bilirubin: 0.3 mg/dL (ref 0.0–1.2)
Total Protein: 6.4 g/dL — ABNORMAL LOW (ref 6.5–8.1)

## 2023-03-23 LAB — CBC WITH DIFFERENTIAL (CANCER CENTER ONLY)
Abs Immature Granulocytes: 0.03 10*3/uL (ref 0.00–0.07)
Basophils Absolute: 0 10*3/uL (ref 0.0–0.1)
Basophils Relative: 1 %
Eosinophils Absolute: 0.1 10*3/uL (ref 0.0–0.5)
Eosinophils Relative: 2 %
HCT: 37.5 % (ref 36.0–46.0)
Hemoglobin: 12.3 g/dL (ref 12.0–15.0)
Immature Granulocytes: 1 %
Lymphocytes Relative: 12 %
Lymphs Abs: 0.4 10*3/uL — ABNORMAL LOW (ref 0.7–4.0)
MCH: 33.2 pg (ref 26.0–34.0)
MCHC: 32.8 g/dL (ref 30.0–36.0)
MCV: 101.4 fL — ABNORMAL HIGH (ref 80.0–100.0)
Monocytes Absolute: 0.3 10*3/uL (ref 0.1–1.0)
Monocytes Relative: 9 %
Neutro Abs: 2.7 10*3/uL (ref 1.7–7.7)
Neutrophils Relative %: 75 %
Platelet Count: 307 10*3/uL (ref 150–400)
RBC: 3.7 MIL/uL — ABNORMAL LOW (ref 3.87–5.11)
RDW: 17 % — ABNORMAL HIGH (ref 11.5–15.5)
WBC Count: 3.6 10*3/uL — ABNORMAL LOW (ref 4.0–10.5)
nRBC: 0 % (ref 0.0–0.2)

## 2023-03-23 MED ORDER — DEXAMETHASONE SODIUM PHOSPHATE 10 MG/ML IJ SOLN
10.0000 mg | Freq: Once | INTRAMUSCULAR | Status: AC
  Administered 2023-03-23: 10 mg via INTRAVENOUS
  Filled 2023-03-23: qty 1

## 2023-03-23 MED ORDER — SODIUM CHLORIDE 0.9 % IV SOLN
150.0000 mg | Freq: Once | INTRAVENOUS | Status: AC
  Administered 2023-03-23: 150 mg via INTRAVENOUS
  Filled 2023-03-23: qty 150

## 2023-03-23 MED ORDER — DEXTROSE 5 % IV SOLN: Freq: Once | INTRAVENOUS | Status: AC

## 2023-03-23 MED ORDER — DENOSUMAB 120 MG/1.7ML ~~LOC~~ SOLN
120.0000 mg | Freq: Once | SUBCUTANEOUS | Status: AC
  Administered 2023-03-23: 120 mg via SUBCUTANEOUS
  Filled 2023-03-23: qty 1.7

## 2023-03-23 MED ORDER — DIPHENHYDRAMINE HCL 25 MG PO CAPS
50.0000 mg | ORAL_CAPSULE | Freq: Once | ORAL | Status: AC
  Administered 2023-03-23: 50 mg via ORAL
  Filled 2023-03-23: qty 2

## 2023-03-23 MED ORDER — PALONOSETRON HCL INJECTION 0.25 MG/5ML
0.2500 mg | Freq: Once | INTRAVENOUS | Status: AC
  Administered 2023-03-23: 0.25 mg via INTRAVENOUS
  Filled 2023-03-23: qty 5

## 2023-03-23 MED ORDER — ACETAMINOPHEN 325 MG PO TABS
650.0000 mg | ORAL_TABLET | Freq: Once | ORAL | Status: AC
  Administered 2023-03-23: 650 mg via ORAL
  Filled 2023-03-23: qty 2

## 2023-03-23 MED ORDER — FAM-TRASTUZUMAB DERUXTECAN-NXKI CHEMO 100 MG IV SOLR
3.2000 mg/kg | Freq: Once | INTRAVENOUS | Status: AC
  Administered 2023-03-23: 200 mg via INTRAVENOUS
  Filled 2023-03-23: qty 10

## 2023-03-23 NOTE — Patient Instructions (Signed)

## 2023-03-23 NOTE — Progress Notes (Signed)
 Per Dr. Pamelia Hoit, OK to administer [facility] today with corrected calcium 8.78 and reported cavity (to be filled this month or next, per patient).

## 2023-03-23 NOTE — Assessment & Plan Note (Signed)
 10/26/17: Left mastectomy: IDC grade 1, 2 foci largest spans 8.5 cm, intermediate grade DCIS, lymphovascular invasion identified, perineural invasion identified, 1/2 lymph nodes positive with extracapsular extension, ER 9200%, PR 5 to 50%, HER-2 negative, Ki-67 10 to 15%, T3N1A   Oncotype DX score 22, intermediate risk, chemotherapy not felt to have significant benefit.   Treatment Summary: 1. Antiestrogen therapy with anastrozole  1 mg daily started 07/15/2016 2. Mastectomy 10/26/2017, Mammaprint low risk luminal type A 3. Followed by adjuvant radiation 12/08/17- 01/26/18  4. Followed by adjuvant antiestrogen therapy anastrozole  started 01/17/2018 (originally started 07/15/2016) -------------------------------------------------------------------- Low back pain August 2023: Underwent CT myelogram: Large expansile lesion in the sacrum with extraosseous extension of the tumor, diffuse lytic lesions throughout the visualized spine with metastatic disease myeloma is considered less likely.  (This was ordered by Dr. Maude Herald)   Treatment plan: 1.  PET CT scan 12/27/2021: Widespread bone metastatic disease largest lesion involving the sacrum with pathological fractures of T6 and T10 retroperitoneal and pelvic lymph node metastasis, right axillary lymph node, activity in the pancreatic head, hypermetabolic activity in the adrenal glands, right thyroid  nodule. 2. biopsy of sacrum: 12/26/2021: Metastatic breast cancer, ER 90%, PR 10%, HER2 negative (0) 3.  Ibrance  along with Faslodex  started 12/25/2021-10/14/2022 4.  Xgeva  for bone metastases.  Every 3 months 5.  Palliative radiation to the sacrum completed 01/19/2022 ---------------------------------------------------------------------------------------------------------------------- Current treatment: Enhertu  started 10/21/2022, today is cycle 6 We plan to continue this treatment irrespective of her blood counts.   Enhertu  toxicities: Tolerated extremely  well without any problems. Completed radiation   Liver function tests have started to improve.   PET/CT 10/29/2022: Overall improved appearance of bone lesions.  Stable right extra lymph node, retroperitoneal lymph nodes decreased activity CT head ordered by Dr. Buckley 11/26/2022: Extensive osseous mets on the skull, extraosseous soft tissue tumor in the right orbit appears improved, inseparable from anterior aspect of superior sagittal sinus and left sigmoid sinus.  Unchanged innumerable scalp nodules CT CAP 02/24/2023: Liver lesions diminished in size sclerosis of widespread bone metastases   Overall this is excellent response to treatment.   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 -Refer to ENT for evaluation and potential treatment options. -Consider referral to GI for esophageal dilation if symptoms persist.   General Health Maintenance -Continue calcium  and vitamin D  supplementation for bone health. -Follow-up in three weeks for continued monitoring of cancer treatment response.       Return to clinic every 3 weeks for Enhertu  treatment.

## 2023-03-23 NOTE — Progress Notes (Signed)
 Patient Care Team: Vernon Velna SAUNDERS, MD as PCP - General (Internal Medicine) Odean Potts, MD as Consulting Physician (Hematology and Oncology) Aron Shoulders, MD as Consulting Physician (General Surgery) Dewey Rush, MD as Consulting Physician (Radiation Oncology) Lenn Aran, MD as Consulting Physician (Radiation Oncology)  DIAGNOSIS:  Encounter Diagnosis  Name Primary?   Malignant neoplasm of lower-outer quadrant of left breast of female, estrogen receptor positive (HCC) Yes    SUMMARY OF ONCOLOGIC HISTORY: Oncology History  Malignant neoplasm of lower-outer quadrant of left breast of female, estrogen receptor positive (HCC)  06/11/2016 Mammogram   Palpable left breast masses 3:00 position: 2.2 cm; 5:30 position: 2.5 cm; 6:30 position: 0.7 cm   06/19/2016 Initial Diagnosis   Left breast biopsy 3:30: IDC with DCIS grade 1, ER 90%, PR 50%, Ki-67 15%, HER-2 negative ratio 1.13; biopsy 5:30 position: IDC grade 1   07/13/2016 Breast MRI   Large area of abnormal enhancement lower inner and lower outer quadrants left breast spanning 9 cm x 6.4 cm x 5.3 cm, no abnormal enlarged lymph nodes; T3 N0 stage II a (New AJCC staging)    07/15/2016 - 12/08/2017 Anti-estrogen oral therapy   Neoadjuvant anastrozole  1 mg daily   07/17/2016 Oncotype testing   Testing done on the biopsy: Oncotype DX score 22, intermediate risk   02/02/2017 Breast MRI   Left breast multicentric disease unchanged measuring 2.7 x 1.6 cm.  Mass in the non-mass enhancement are also not significantly changed measuring 6.2 x 2.4 cm. new enhancing mass within the outer right breast 7 mm which could be fat necrosis or inclusion cyst    02/09/2017 Imaging   Ultrasound of the right breast lesion noted on MRI: No sonographic finding corresponds to the abnormality noted on MRI   07/13/2017 Cancer Staging   Staging form: Breast, AJCC 8th Edition - Clinical stage from 07/13/2017: Stage IIA (cT3, cN0, cM0, G1, ER+, PR+, HER2-) -  Signed by Crawford Morna Pickle, NP on 05/18/2018   10/26/2017 Surgery   Left mastectomy: IDC grade 1, 2 foci largest spans 8.5 cm, intermediate grade DCIS, lymphovascular invasion identified, perineural invasion identified, 1/2 lymph nodes positive with extracapsular extension, ER 9200%, PR 5 to 50%, HER-2 negative, Ki-67 10 to 15%, T3N1A Mammaprint: low risk   11/02/2017 Cancer Staging   Staging form: Breast, AJCC 8th Edition - Pathologic: No Stage Recommended (ypT3, pN1a, cM0, G1, ER+, PR+, HER2-) - Signed by Odean Potts, MD on 11/02/2017   12/08/2017 - 01/26/2018 Radiation Therapy   Adjuvant radiation therapy    02/2018 -  Anti-estrogen oral therapy   Anastrozole  1 mg daily adjuvant therapy   10/21/2022 -  Chemotherapy   Patient is on Treatment Plan : BREAST METASTATIC Fam-Trastuzumab Deruxtecan-nxki  (Enhertu ) (5.4) q21d       CHIEF COMPLIANT: Enhertu  cycle 8  HISTORY OF PRESENT ILLNESS:  History of Present Illness   The patient, with a history of metastatic cancer, has been undergoing treatment with Enhertu  after an initial course of Ibrance . The patient's condition has significantly improved from a critical state to a much better condition within a span of six to eight weeks. The patient reports increased energy and stamina, although not yet at 100%. The patient's hair, which had been lost due to treatment, is reported to have grown back fully. The patient is also undergoing physical therapy, which she reports as beneficial. The patient's family member inquires about the possibility of remission, indicating a hope for further improvement.  ALLERGIES:  is allergic to percocet [oxycodone -acetaminophen ].  MEDICATIONS:  Current Outpatient Medications  Medication Sig Dispense Refill   azelastine (ASTELIN) 0.1 % nasal spray Place into both nostrils 2 (two) times daily. (Patient not taking: Reported on 11/03/2022)     Calcium  500-100 MG-UNIT CHEW Chew 1 tablet by mouth daily.  60 tablet    cetirizine (ZYRTEC) 10 MG tablet Take by mouth. (Patient not taking: Reported on 11/03/2022)     cholecalciferol (VITAMIN D3) 25 MCG (1000 UNIT) tablet Take 1 tablet (1,000 Units total) by mouth daily. (Patient not taking: Reported on 11/03/2022)     dexamethasone  (DECADRON ) 4 MG tablet Take 1 tab for 2 days after chemo (Patient not taking: Reported on 12/28/2022) 30 tablet 1   furosemide  (LASIX ) 20 MG tablet Take 1 tablet (20 mg total) by mouth daily. If needed may take 1/2 tab q am 30 tablet 0   levothyroxine  (SYNTHROID ) 112 MCG tablet Take 1 tablet by mouth daily.     LORazepam  (ATIVAN ) 0.5 MG tablet Take 1 tablet (0.5 mg total) by mouth at bedtime. (Patient not taking: Reported on 11/03/2022) 30 tablet 0   omeprazole  (PRILOSEC) 20 MG capsule Take 1 capsule (20 mg total) by mouth daily. 30 capsule 3   ondansetron  (ZOFRAN ) 8 MG tablet Take 1 tablet (8 mg total) by mouth every 8 (eight) hours as needed for nausea or vomiting. Start on the third day after chemotherapy. (Patient not taking: Reported on 11/03/2022) 30 tablet 1   pantoprazole (PROTONIX) 40 MG tablet 1 tablet Orally Once a day for 30 days Take 1 tablet twice daily for 2 weeks then take 1 tablet daily. (Patient not taking: Reported on 12/28/2022)     prochlorperazine  (COMPAZINE ) 10 MG tablet Take 1 tablet (10 mg total) by mouth every 6 (six) hours as needed for nausea or vomiting. (Patient not taking: Reported on 11/03/2022) 30 tablet 1   sucralfate  (CARAFATE ) 1 g tablet TAKE 1 TABLET BY MOUTH 4 TIMES DAILY WITH MEALS AND AT BEDTIME. DISSOLVE IN WARM WATER, SWISH AND SWALLOW. 120 tablet 1   vitamin C  (ASCORBIC ACID) 250 MG tablet Take 1 tablet (250 mg total) by mouth daily.     No current facility-administered medications for this visit.   Facility-Administered Medications Ordered in Other Visits  Medication Dose Route Frequency Provider Last Rate Last Admin   denosumab  (XGEVA ) injection 120 mg  120 mg Subcutaneous Once Olegario Emberson,  Ima Hafner, MD       dexamethasone  (DECADRON ) injection 10 mg  10 mg Intravenous Once Annelise Mccoy, MD       fam-trastuzumab deruxtecan-nxki  (ENHERTU ) 200 mg in dextrose  5 % 100 mL chemo infusion  3.2 mg/kg (Treatment Plan Recorded) Intravenous Once Keontay Vora, MD       fosaprepitant  (EMEND) 150 mg in sodium chloride  0.9 % 145 mL IVPB  150 mg Intravenous Once Francoise Chojnowski, MD       palonosetron  (ALOXI ) injection 0.25 mg  0.25 mg Intravenous Once Gini Caputo, MD        PHYSICAL EXAMINATION: ECOG PERFORMANCE STATUS: 1 - Symptomatic but completely ambulatory  Vitals:   03/23/23 1347  BP: 125/81  Pulse: (!) 116  Resp: 18  Temp: 98.1 F (36.7 C)  SpO2: 98%   Filed Weights   03/23/23 1347  Weight: 149 lb 12.8 oz (67.9 kg)    Physical Exam   MEASUREMENTS: WT- 149.5 SKIN: Hair fully regrown on head, not in patches.      (exam performed in the  presence of a chaperone)  LABORATORY DATA:  I have reviewed the data as listed    Latest Ref Rng & Units 03/23/2023    1:07 PM 03/02/2023    1:25 PM 02/04/2023    1:17 PM  CMP  Glucose 70 - 99 mg/dL 888  94  93   BUN 8 - 23 mg/dL 14  7  11    Creatinine 0.44 - 1.00 mg/dL 9.41  9.42  9.43   Sodium 135 - 145 mmol/L 137  138  138   Potassium 3.5 - 5.1 mmol/L 4.2  4.0  3.9   Chloride 98 - 111 mmol/L 108  106  106   CO2 22 - 32 mmol/L 23  25  26    Calcium  8.9 - 10.3 mg/dL 8.7  9.5  9.7   Total Protein 6.5 - 8.1 g/dL 6.4  6.8  7.0   Total Bilirubin 0.0 - 1.2 mg/dL 0.3  0.4  0.3   Alkaline Phos 38 - 126 U/L 95  126  125   AST 15 - 41 U/L 26  34  21   ALT 0 - 44 U/L 10  12  9      Lab Results  Component Value Date   WBC 3.6 (L) 03/23/2023   HGB 12.3 03/23/2023   HCT 37.5 03/23/2023   MCV 101.4 (H) 03/23/2023   PLT 307 03/23/2023   NEUTROABS 2.7 03/23/2023    ASSESSMENT & PLAN:  Malignant neoplasm of lower-outer quadrant of left breast of female, estrogen receptor positive (HCC) 10/26/17: Left mastectomy: IDC grade 1, 2 foci largest  spans 8.5 cm, intermediate grade DCIS, lymphovascular invasion identified, perineural invasion identified, 1/2 lymph nodes positive with extracapsular extension, ER 9200%, PR 5 to 50%, HER-2 negative, Ki-67 10 to 15%, T3N1A   Oncotype DX score 22, intermediate risk, chemotherapy not felt to have significant benefit.   Treatment Summary: 1. Antiestrogen therapy with anastrozole  1 mg daily started 07/15/2016 2. Mastectomy 10/26/2017, Mammaprint low risk luminal type A 3. Followed by adjuvant radiation 12/08/17- 01/26/18  4. Followed by adjuvant antiestrogen therapy anastrozole  started 01/17/2018 (originally started 07/15/2016) -------------------------------------------------------------------- Low back pain August 2023: Underwent CT myelogram: Large expansile lesion in the sacrum with extraosseous extension of the tumor, diffuse lytic lesions throughout the visualized spine with metastatic disease myeloma is considered less likely.  (This was ordered by Dr. Maude Herald)   Treatment plan: 1.  PET CT scan 12/27/2021: Widespread bone metastatic disease largest lesion involving the sacrum with pathological fractures of T6 and T10 retroperitoneal and pelvic lymph node metastasis, right axillary lymph node, activity in the pancreatic head, hypermetabolic activity in the adrenal glands, right thyroid  nodule. 2. biopsy of sacrum: 12/26/2021: Metastatic breast cancer, ER 90%, PR 10%, HER2 negative (0) 3.  Ibrance  along with Faslodex  started 12/25/2021-10/14/2022 4.  Xgeva  for bone metastases.  Every 3 months 5.  Palliative radiation to the sacrum completed 01/19/2022 ---------------------------------------------------------------------------------------------------------------------- Current treatment: Enhertu  started 10/21/2022, today is cycle 8 We plan to continue this treatment irrespective of her blood counts.   Enhertu  toxicities: Tolerated extremely well without any problems. Completed radiation    Liver function tests have started to improve.   PET/CT 10/29/2022: Overall improved appearance of bone lesions.  Stable right extra lymph node, retroperitoneal lymph nodes decreased activity CT head ordered by Dr. Buckley 11/26/2022: Extensive osseous mets on the skull, extraosseous soft tissue tumor in the right orbit appears improved, inseparable from anterior aspect of superior sagittal sinus and left sigmoid sinus.  Unchanged innumerable scalp nodules CT CAP 02/24/2023: Liver lesions diminished in size sclerosis of widespread bone metastases   Overall this is excellent response to treatment.   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 -Refer to ENT for evaluation and potential treatment options. -Consider referral to GI for esophageal dilation if symptoms persist.   General Health Maintenance -Continue calcium  and vitamin D  supplementation for bone health. -Follow-up in three weeks for continued monitoring of cancer treatment response.       Return to clinic every 3 weeks for Enhertu  treatment.    No orders of the defined types were placed in this encounter.  The patient has a good understanding of the overall plan. she agrees with it. she will call with any problems that may develop before the next visit here. Total time spent: 30 mins including face to face time and time spent for planning, charting and co-ordination of care   Viinay K Ngina Royer, MD 03/23/23

## 2023-03-24 ENCOUNTER — Ambulatory Visit: Payer: Medicaid Other | Admitting: Physical Therapy

## 2023-03-30 ENCOUNTER — Ambulatory Visit: Payer: Medicaid Other

## 2023-03-30 ENCOUNTER — Other Ambulatory Visit: Payer: Medicaid Other

## 2023-03-30 ENCOUNTER — Ambulatory Visit: Payer: Medicaid Other | Admitting: Hematology and Oncology

## 2023-03-31 ENCOUNTER — Ambulatory Visit: Payer: Medicaid Other

## 2023-03-31 ENCOUNTER — Ambulatory Visit: Payer: Medicaid Other | Attending: Hematology and Oncology | Admitting: Physical Therapy

## 2023-03-31 DIAGNOSIS — R490 Dysphonia: Secondary | ICD-10-CM | POA: Insufficient documentation

## 2023-03-31 DIAGNOSIS — R262 Difficulty in walking, not elsewhere classified: Secondary | ICD-10-CM | POA: Diagnosis present

## 2023-03-31 DIAGNOSIS — M25562 Pain in left knee: Secondary | ICD-10-CM | POA: Diagnosis present

## 2023-03-31 DIAGNOSIS — M25611 Stiffness of right shoulder, not elsewhere classified: Secondary | ICD-10-CM

## 2023-03-31 DIAGNOSIS — M542 Cervicalgia: Secondary | ICD-10-CM

## 2023-03-31 DIAGNOSIS — M25612 Stiffness of left shoulder, not elsewhere classified: Secondary | ICD-10-CM | POA: Diagnosis present

## 2023-03-31 DIAGNOSIS — M6281 Muscle weakness (generalized): Secondary | ICD-10-CM

## 2023-03-31 DIAGNOSIS — M436 Torticollis: Secondary | ICD-10-CM | POA: Diagnosis present

## 2023-03-31 DIAGNOSIS — R29898 Other symptoms and signs involving the musculoskeletal system: Secondary | ICD-10-CM | POA: Diagnosis present

## 2023-03-31 NOTE — Therapy (Signed)
 OUTPATIENT SPEECH LANGUAGE PATHOLOGY  VOICE EVALUATION   Patient Name: Kathryn Lucas MRN: [id number] DOB:[birth year], 63 y.o., female Today's Date: 03/31/2023  PCP: Lilyan Remedies, MD  REFERRING PROVIDER: Arlinda Benne, [name]   End of Session - 03/31/23 1615     Visit Number 1    Number of Visits 24    Date for SLP Re-Evaluation 06/23/23    SLP Start Time 1530    SLP Stop Time  1408    SLP Time Calculation (min) 1358 min             Past Medical History:  Diagnosis Date   Breast cancer (HCC)    left breast cancer   Cancer (HCC) 09/2017   left breast cancer   Family history of breast cancer    Hypothyroidism    Personal history of radiation therapy 2019   Thyroid  disease    Past Surgical History:  Procedure Laterality Date   BREAST BIOPSY Left 2018   BREAST BIOPSY Left 2019   BREAST RECONSTRUCTION WITH PLACEMENT OF TISSUE EXPANDER AND ALLODERM Left 10/26/2017   Procedure: LEFT BREAST RECONSTRUCTION WITH PLACEMENT OF TISSUE EXPANDER AND ALLODERM;  Surgeon: Alger Infield, MD;  Location: [facility];  Service: Plastics;  Laterality: Left;   MASTECTOMY Left 2019   MASTECTOMY WITH RADIOACTIVE SEED GUIDED EXCISION AND AXILLARY SENTINEL LYMPH NODE BIOPSY Left 10/26/2017   Procedure: LEFT MASTECTOMY WITH SEED TARGETED  LEFT AXILLARY LYMPH NODE EXCISION AND LEFT SENTINEL LYMPH NODE BIOPSY;  Surgeon: Lockie Rima, MD;  Location: [facility];  Service: General;  Laterality: Left;   TONSILLECTOMY     WISDOM TOOTH EXTRACTION     Patient Active Problem List   Diagnosis Date Noted   Metastatic 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) 10/14/2022   Elevated liver enzymes 10/14/2022   Unexplained weight loss 10/14/2022   Neoplasm related pain 10/14/2022   Metastatic malignant neoplasm (HCC) 12/19/2021   Family history of breast cancer    History of therapeutic radiation 04/27/2018   Breast cancer, left breast (HCC) 10/26/2017   Malignant neoplasm of lower-outer quadrant of left  breast of female, estrogen receptor positive (HCC) 07/15/2016   Plantar fasciitis 07/30/2015   Metatarsalgia 10/18/2014    ONSET DATE:  03/29/23 (referral); since July 2024 per pt  REFERRING DIAG: Hoarseness  THERAPY DIAG:  Dysphonia  Hoarseness  Rationale for Evaluation and Treatment Rehabilitation  SUBJECTIVE:   SUBJECTIVE STATEMENT: Pt alert, pleasant, and cooperative. Dysphonia apparent. Pt accompanied by: self  PERTINENT HISTORY: 63 y.o. female who presents for voice evaluation. 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 On endoscopy, 1/13, pt with weakness and bowing of B vocal cords with more weakness on L than R. Pt referred for course ST prior to consideration laryngologist referral per ENT note.  DIAGNOSTIC FINDINGS: 02/18/23, CT Chest Abdomen Pelvis, "1. Hypodense lesion of the medial liver dome, in retrospective review diminished in size compared to prior PET-CT examination. Findings are consistent with treatment response of hepatic metastatic disease. 2. Slightly increased sclerosis of widespread mixed lytic and sclerotic osseous metastatic disease throughout the axial skeleton, consistent with treatment response. 3. Unchanged post treatment/post radiation appearance of the right chest, with perihilar fibrosis and consolidation. 4. Status post left mastectomy with saline spacer."  PAIN:  Are you having pain? No (FACES)   FALLS: defer to PT  LIVING ENVIRONMENT: Lives with: lives alone   PLOF: Independent  PATIENT GOALS    for  voice to be stronger  OBJECTIVE:  COGNITION: Overall cognitive status: Within functional limits for tasks assessed  SOCIAL HISTORY: Occupation: on disability Water intake: suboptimal Caffeine/alcohol intake: minimal Daily voice use: minimal Environmental risks: None reported Occupational risks: None identified Misuse: Excessively high pitch and Speaks without adequate  breath support Phonotraumatic behaviors: Speaks at a great distance from listeners during bible study, throat clearing  PERCEPTUAL VOICE ASSESSMENT: Voice quality: hoarse, breathy, diplophonia, and vocal fatigue Resonance: normal Respiratory function: thoracic breathing and clavicular breathing  OBJECTIVE VOICE ASSESSMENT: Sustained "ah" maximum phonation time: 4s seconds Sustained "ah" loudness average: 80 dB Average fundamental frequency during sustained "ah":375 Hz   (>2 SD above average of  244 Hz +/- 27 for gender)  Oral reading (passage) loudness average: 76  dB Conversational pitch average: 224 Hz Highest dynamic pitch in conversational speech: 307 Hz Lowest dynamic pitch in conversational speech: 83 Hz Conversational loudness average: 60-83 dB Conversational loudness range: 75 dB   Stimulability trials: Pt stimulable to both resonant voice therapy and clear speech therapy with mild improvement in vocal quality and pt with good awareness of how speech sounded when employing strategies.     ORAL MOTOR EXAMINATION No functional deficits appreciated   PATIENT REPORTED OUTCOME MEASURES (PROM):  VOICE HANDICAP INDEX (VHI)  The Voice Handicap Index is comprised of a series of questions to assess the patient's perception of their voice. It is designed to evaluate the emotional, physical and functional components of the voice problem.  Functional: 17/40 Physical: 14/40 Emotional: 12/40 Total: 42/120 moderate      PATIENT EDUCATION: Education details: role of SLP, voice therapy, POC Person educated: Patient Education method: Explanation Education comprehension: verbalized understanding   HOME EXERCISE PROGRAM: To be given in upcoming sessions     GOALS: Goals reviewed with patient? Yes  SHORT TERM GOALS: Target date: 10 sessions  Pt will improve water intake to 5-8 glasses per day per pt report. Baseline: Goal status: INITIAL  2.  The patient will  demonstrate abdominal breathing patterns and steady release of breath on exhalation to optimize efficiency of voicing and decrease laryngeal hyperfunction with min cueing.  Baseline:  Goal status: INITIAL  3.  The patient will eliminate phonotraumatic behaviors such as chronic throat clearing, by substituting non-traumatic methods to clear mucus with min cueing.  Baseline:  Goal status: INITIAL  4.  The patient will utilize a forward tone focused/resonant voice to decrease vocal hyperfunction and improve voice quality and vocal projection with min cueing. Baseline:  Goal status: INITIAL  5.  Pt will perform HEP for voice with min cueing. Baseline:  Goal status: INITIAL  6.  The patient will participate in 5-8 minutes conversation, maintaining average loudness of 75 dB and loud, good quality voice with min cues.     Baseline:  Goal status: INITIAL  LONG TERM GOALS: Target date: 12 weeks  Pt will improve score on the VHI by 10% indicating improved perception of speech abilities. Baseline: 42/120 Goal status: INITIAL  2.  The patient will demonstrate abdominal breathing patterns and steady release of breath on exhalation to optimize efficiency of voicing and decrease laryngeal hyperfunction.  Baseline:  Goal status: INITIAL    3.  Pt will perform HEP for voice independently. Baseline:  Goal status: INITIAL  4.  The patient will participate in 15-20 minutes conversation, maintaining average loudness of 75 dB and loud, good quality voice with min cues.     Baseline:  Goal status: INITIAL  ASSESSMENT:  CLINICAL IMPRESSION: Patient is a 63 y.o. female who presents for voice evaluation. 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 On endoscopy, 03/29/23, pt with weakness and bowing of B vocal cords with more weakness on L than R. Pt referred for course ST prior to consideration laryngologist referral per ENT note. Pt's voice  with perceptual characteristics of breathiness, hoarseness, volume decay, and diplophonia. Pt with tendency to utilize clavicular breathing. Pt with stimulable to resonant voice therapy and clear speech therapy. Pt reports dissatisfactoin with voice particularly in noisy environments or instances when she needs to project (e.g. bible study). Recommend course of speech therapy to improve pt's voice in social settings and for ADLs/iADLs.  OBJECTIVE IMPAIRMENTS include voice disorder. These impairments are limiting patient from effectively communicating at home and in community. Factors affecting potential to achieve goals and functional outcome are  overall medical status/comorbidities . Patient will benefit from skilled SLP services to address above impairments and improve overall function.  REHAB POTENTIAL: Good  PLAN: SLP FREQUENCY: 1-2x/week  SLP DURATION: 12 weeks  PLANNED INTERVENTIONS: Cueing hierachy, Internal/external aids, Functional tasks, Multimodal communication approach, SLP instruction and feedback, Compensatory strategies, and Patient/family education    Dia Forget, M.S., CCC-SLP Speech-Language Pathologist [facility] [phone] [place])  [facility] at [facility] [address] [place], [place], [address] Phone: [phone]   Fax:  [phone]

## 2023-03-31 NOTE — Therapy (Signed)
OUTPATIENT PHYSICAL THERAPY TREATMENT/  PHYSICAL THERAPY PROGRESS NOTE   Dates of reporting period  01/12/2023   to   03/31/2023     Patient Name: Kathryn Lucas MRN: [id number] DOB:1960/09/17, 63 y.o., female Today's Date: 03/31/2023   PCP: Ollen Bowl, MD  REFERRING PROVIDER: [name], MD   END OF SESSION:  PT End of Session - 03/31/23 1449     Visit Number 20    Number of Visits 36    Date for PT Re-Evaluation --    Progress Note Due on Visit 20    PT Start Time 1450    PT Stop Time 1530    PT Time Calculation (min) 40 min    Equipment Utilized During Treatment Gait belt    Activity Tolerance Patient tolerated treatment well;No increased pain    Behavior During Therapy [facility] for tasks assessed/performed             Past Medical History:  Diagnosis Date   Breast cancer (HCC)    left breast cancer   Cancer (HCC) 09/2017   left breast cancer   Family history of breast cancer    Hypothyroidism    Personal history of radiation therapy 2019   Thyroid disease    Past Surgical History:  Procedure Laterality Date   BREAST BIOPSY Left 2018   BREAST BIOPSY Left 2019   BREAST RECONSTRUCTION WITH PLACEMENT OF TISSUE EXPANDER AND ALLODERM Left 10/26/2017   Procedure: LEFT BREAST RECONSTRUCTION WITH PLACEMENT OF TISSUE EXPANDER AND ALLODERM;  Surgeon: Glenna Fellows, MD;  Location: [facility];  Service: Plastics;  Laterality: Left;   MASTECTOMY Left 2019   MASTECTOMY WITH RADIOACTIVE SEED GUIDED EXCISION AND AXILLARY SENTINEL LYMPH NODE BIOPSY Left 10/26/2017   Procedure: LEFT MASTECTOMY WITH SEED TARGETED  LEFT AXILLARY LYMPH NODE EXCISION AND LEFT SENTINEL LYMPH NODE BIOPSY;  Surgeon: Almond Lint, MD;  Location: [facility];  Service: General;  Laterality: Left;   TONSILLECTOMY     WISDOM TOOTH EXTRACTION     Patient Active Problem List   Diagnosis Date Noted   Metastatic 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) 10/14/2022   Elevated liver  enzymes 10/14/2022   Unexplained weight loss 10/14/2022   Neoplasm related pain 10/14/2022   Metastatic malignant neoplasm (HCC) 12/19/2021   Family history of breast cancer    History of therapeutic radiation 04/27/2018   Breast cancer, left breast (HCC) 10/26/2017   Malignant neoplasm of lower-outer quadrant of left breast of female, estrogen receptor positive (HCC) 07/15/2016   Plantar fasciitis 07/30/2015   Metatarsalgia 10/18/2014    ONSET DATE: September 2023   REFERRING DIAG:  Diagnosis  C79.51 (ICD-10-CM) - Secondary malignant neoplasm of bone    THERAPY DIAG:  Muscle weakness (generalized)  Stiffness of cervical spine  Difficulty in walking, not elsewhere classified  Decreased range of motion of right shoulder  Acute pain of left knee  Painful cervical ROM  Decreased range of motion of left shoulder  Leg weakness, bilateral  Rationale for Evaluation and Treatment: Rehabilitation  SUBJECTIVE:  SUBJECTIVE STATEMENT: Pt reports  her recent CT shows cancer levels have decreased. Pt states that she feels like she is continuing to move better, but feels like her strength is still limiting her ability to get up and down without the use of her arms.   PERTINENT HISTORY:   Pt is 63 y.o. female that is seeking therapy for weakness of the R LE. Pt tripped over the threshold of her door back in September of 2023, and had a transverse fracture of the L patella when she fell on it. She was recently diagnosed with bone cancer back in October of 2023. Pt notes that she has more stiffness in the neck and pelvis region because the cancer is located in those locations. Pt had exacerbation of s/s in June of 2024 with hospitalization and subsequent change in oncology treatment limiting ability to  attend PT. Pt has responded well to treatment and now able to attend PT.   PAIN:  Are you having pain? No   PRECAUTIONS: Cervical and Other: cervical for comfort   RED FLAGS: None   WEIGHT BEARING RESTRICTIONS: No  FALLS: Has patient fallen in last 6 months? Yes. Number of falls 1  LIVING ENVIRONMENT: Lives with: lives alone Lives in: House/apartment Stairs:  yes, but uses ramp  Has following equipment at home: Walker - 4 wheeled  PLOF: Needs assistance with ADLs and Needs assistance with homemaking  PATIENT GOALS: improve balance, strength and endurance. Stand at kitchen longer and   OBJECTIVE:   DIAGNOSTIC FINDINGS:  COGNITION: Overall cognitive status: Within functional limits for tasks assessed   SENSATION: WFL   LOWER EXTREMITY MMT:   MMT  Right Eval Left Eval    Hip flexion 4 4    Hip extension      Hip abduction 4 4    Hip adduction 4+ 4+    Hip internal rotation      Hip external rotation      Knee flexion 4 4    Knee extension 4+ 4+    Ankle dorsiflexion 5 5    Ankle plantarflexion 4 4    Ankle inversion      Ankle eversion       (Blank rows = not tested)    TODAY'S TREATMENT:                                                                                                                              DATE: 03/31/2023  Goal assessment. See below.   HR 128-135 throughout session. BP 97/57. SpO2: 98%. No s/s   Throughout session pt required occasional seated rest breaks due heart racing and LE fatigue. No LOB throughout session and reduced use of BUE with step ups compared to prior sessions with this PT.  PATIENT EDUCATION: Education details: POC.  Person educated: Patient  Education method: Explanation Education comprehension: verbalized understanding  HOME EXERCISE PROGRAM: Access Code: [email or web address] URL: [email or web address] Date: 02/18/2023 Prepared by:  Grier Rocher  Exercises - Mini Squat with Counter Support  - 1 x  daily - 7 x weekly - 3 sets - 10 reps - Standing Hip Abduction with Counter Support  - 1 x daily - 7 x weekly - 3 sets - 10 reps - Standing March with Counter Support  - 1 x daily - 7 x weekly - 3 sets - 10 reps - Seated Long Arc Quad  - 1 x daily - 7 x weekly - 3 sets - 10 reps - Sidelying Hip Abduction  - 1 x daily - 7 x weekly - 3 sets - 10 reps - Supine Active Straight Leg Raise  - 1 x daily - 7 x weekly - 3 sets - 10 reps - Supine Quad Set  - 1 x daily - 7 x weekly - 3 sets - 10 reps - Clamshell  - 1 x daily - 7 x weekly - 3 sets - 10 reps - Seated Hip Abduction with Resistance  - 1 x daily - 7 x weekly - 3 sets - 10 reps - Seated Knee Extension with Resistance  - 1 x daily - 7 x weekly - 3 sets - 10 reps - Seated March with Resistance  - 1 x daily - 7 x weekly - 3 sets - 10 reps - Seated Heel Toe Raises  - 1 x daily - 7 x weekly - 3 sets - 10 reps - Seated Ankle Plantarflexion with Resistance  - 1 x daily - 7 x weekly - 3 sets - 10 reps  GOALS: Goals reviewed with patient? Yes  SHORT TERM GOALS: Target date: 01/13/2023   Patient will be independent in home exercise program to improve strength/mobility for better functional independence with ADLs. Baseline:to  given 9/30  10/29. Pt reports inconsistent HEP completion. Goal status: IN PROGRESS   LONG TERM GOALS: Target date: 03/03/2023    Patient will increase FOTO score to equal to or greater than  68   to demonstrate statistically significant improvement in mobility and quality of life.  Baseline: 63  10/29: 74 02/17/2023: 78 03/31/23: 73  Goal status: MET  2.  Patient (> 36 years old) will complete five times sit to stand test in < 15 seconds without use of BUE indicating an increased LE strength and improved balance. Baseline: 15.82 with UE support  10/29: 13.84 with UE support push from seat  12/4: 11.84sec pushing from thighs.  1/15: 15.67 sec pushing from thighs standard seat height    Goal status: IN PROGRESS;  revised    3.  Patient will increase Berg Balance score by > 6 points to demonstrate decreased fall risk during functional activities Baseline: 46; 10/29: 49; 12/4: 52 Goal status: MET  4.  Patient will increase 6 min walk test to >1438ft as to improve gait speed for improved ability to safely navigate the community for IADL.  Baseline: 950; 10/29 1158ft; 12/4: 1150  1/15:1163ft reports increased HR. Measured at 132.  Goal status: IN PROGRESS ; revised   5.  Patient will reduce timed up and go to <11 seconds to reduce fall risk and demonstrate improved transfer/gait ability. Baseline: 15.72 10/29: 9.97sec  12/4: 8.4 sec  1/15: 9.7sec Goal status: MET  6.  Patient will increase FGA score to >22 as to demonstrate reduced fall risk and improved dynamic gait balance for better safety with community/home ambulation.  Baseline:19 10/29 24 Goal status: MET   ASSESSMENT:  CLINICAL IMPRESSION: Pt present to PT  for progress note. She states that she feels like her endurance and strength still have room to improve to allow her improved independence and return to function. Was noted to have mild increase in 5x STS with use of UE on thighs. Mild improvement in 6 min walk test,  but no significant change in Foto or TUG. Patient's condition has the potential to improve in response to therapy. Maximum improvement is yet to be obtained. The anticipated improvement is attainable and reasonable in a generally predictable time.  Pt may continue to benefit from skilled PT pending additional authorization for PT treatment  to address balance, strength and ROM deficits, improve safety and QoL while continuing CA treatment.    OBJECTIVE IMPAIRMENTS: Abnormal gait, cardiopulmonary status limiting activity, decreased activity tolerance, decreased balance, decreased endurance, decreased knowledge of condition, decreased mobility, difficulty walking, decreased ROM, decreased strength, hypomobility, increased  fascial restrictions, impaired perceived functional ability, impaired flexibility, impaired UE functional use, improper body mechanics, postural dysfunction, and pain.   ACTIVITY LIMITATIONS: carrying, lifting, bending, standing, squatting, sleeping, stairs, transfers, bathing, toileting, dressing, reach over head, and locomotion level  PARTICIPATION LIMITATIONS: cleaning, laundry, driving, shopping, community activity, and yard work  PERSONAL FACTORS: Age, Behavior pattern, Fitness, Past/current experiences, and 1-2 comorbidities: CA with profound metastases   are also affecting patient's functional outcome.   REHAB POTENTIAL: Good  CLINICAL DECISION MAKING: Evolving/moderate complexity  EVALUATION COMPLEXITY: Moderate  PLAN:  PT FREQUENCY: 1-2x/week  PT DURATION: 12 weeks  PLANNED INTERVENTIONS: Therapeutic exercises, Therapeutic activity, Neuromuscular re-education, Balance training, Gait training, Patient/Family education, Self Care, Joint mobilization, Stair training, DME instructions, Cryotherapy, Moist heat, and Manual therapy  PLAN FOR NEXT SESSION:  Balance, strengthening for BLE/BUE.  Neck pain management as indicated.    Grier Rocher PT, DPT  Physical Therapist - [place]  [facility]  8:17 AM 04/01/23

## 2023-04-01 ENCOUNTER — Telehealth: Payer: Self-pay

## 2023-04-01 NOTE — Telephone Encounter (Signed)
Patient left message with scheduler requesting a return call regarding possible termination of medicaid. I contacted Kathryn Lucas, DSS Rep at [facility], was informed, no termination of medicaid, was switched from [facility] to [facility], but as of April 17, 2023 will be placed back onto [facility], now that she is receiving disability, should be receiving information regarding medicare soon, but still will not terminate coverage if needs to transition to medicare or adult medicaid. Patient informed, and was reassured.

## 2023-04-05 ENCOUNTER — Ambulatory Visit: Payer: Medicaid Other

## 2023-04-05 DIAGNOSIS — R490 Dysphonia: Secondary | ICD-10-CM

## 2023-04-05 DIAGNOSIS — M6281 Muscle weakness (generalized): Secondary | ICD-10-CM | POA: Diagnosis not present

## 2023-04-05 NOTE — Therapy (Signed)
OUTPATIENT SPEECH LANGUAGE PATHOLOGY  VOICE TREATMENT   Patient Name: Kathryn Lucas MRN: [id number] DOB:09-22-1960, 63 y.o., female Today's Date: 04/05/2023  PCP: Jacqulyn Bath, MD  REFERRING PROVIDER: Bonney Roussel, [name]   End of Session - 04/05/23 1458     Visit Number 2    Number of Visits 24    Date for SLP Re-Evaluation 06/23/23    Authorization Type carelon order#[id number]    Authorization Time Period 1/20-3/20 for 8 SLP visits    Authorization - Visit Number 1    Authorization - Number of Visits 8    Progress Note Due on Visit 10    SLP Start Time 1400    SLP Stop Time  1455    SLP Time Calculation (min) 55 min    Activity Tolerance Patient tolerated treatment well             Past Medical History:  Diagnosis Date   Breast cancer (HCC)    left breast cancer   Cancer (HCC) 09/2017   left breast cancer   Family history of breast cancer    Hypothyroidism    Personal history of radiation therapy 2019   Thyroid disease    Past Surgical History:  Procedure Laterality Date   BREAST BIOPSY Left 2018   BREAST BIOPSY Left 2019   BREAST RECONSTRUCTION WITH PLACEMENT OF TISSUE EXPANDER AND ALLODERM Left 10/26/2017   Procedure: LEFT BREAST RECONSTRUCTION WITH PLACEMENT OF TISSUE EXPANDER AND ALLODERM;  Surgeon: Glenna Fellows, MD;  Location: [facility];  Service: Plastics;  Laterality: Left;   MASTECTOMY Left 2019   MASTECTOMY WITH RADIOACTIVE SEED GUIDED EXCISION AND AXILLARY SENTINEL LYMPH NODE BIOPSY Left 10/26/2017   Procedure: LEFT MASTECTOMY WITH SEED TARGETED  LEFT AXILLARY LYMPH NODE EXCISION AND LEFT SENTINEL LYMPH NODE BIOPSY;  Surgeon: Almond Lint, MD;  Location: [facility];  Service: General;  Laterality: Left;   TONSILLECTOMY     WISDOM TOOTH EXTRACTION     Patient Active Problem List   Diagnosis Date Noted   Metastatic 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) 10/14/2022   Elevated liver enzymes 10/14/2022   Unexplained weight loss 10/14/2022    Neoplasm related pain 10/14/2022   Metastatic malignant neoplasm (HCC) 12/19/2021   Family history of breast cancer    History of therapeutic radiation 04/27/2018   Breast cancer, left breast (HCC) 10/26/2017   Malignant neoplasm of lower-outer quadrant of left breast of female, estrogen receptor positive (HCC) 07/15/2016   Plantar fasciitis 07/30/2015   Metatarsalgia 10/18/2014    ONSET DATE:  03/29/23 (referral); since July 2024 per pt  REFERRING DIAG: Hoarseness  THERAPY DIAG:  Dysphonia  Hoarseness  Rationale for Evaluation and Treatment Rehabilitation  SUBJECTIVE:   SUBJECTIVE STATEMENT: Pt alert, pleasant, and cooperative. Dysphonia apparent. Pt accompanied by: self  PERTINENT HISTORY: 63 y.o. female who presents for voice evaluation. 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 On endoscopy, 1/13, pt with weakness and bowing of B vocal cords with more weakness on L than R. Pt referred for course ST prior to consideration laryngologist referral per ENT note.  DIAGNOSTIC FINDINGS: 02/18/23, CT Chest Abdomen Pelvis, "1. Hypodense lesion of the medial liver dome, in retrospective review diminished in size compared to prior PET-CT examination. Findings are consistent with treatment response of hepatic metastatic disease. 2. Slightly increased sclerosis of widespread mixed lytic and sclerotic osseous metastatic disease throughout the axial skeleton, consistent with treatment response. 3. Unchanged post  treatment/post radiation appearance of the right chest, with perihilar fibrosis and consolidation. 4. Status post left mastectomy with saline spacer."  PAIN:  Are you having pain? No (FACES)   FALLS: defer to PT  LIVING ENVIRONMENT: Lives with: lives alone   PLOF: Independent  PATIENT GOALS    for voice to be stronger  OBJECTIVE:  TODAY'S TREATMENT: Introduced Passenger transport manager ex's. Pt completed as  follows with use of biofeedback and min/mod verbal cues: Sustained "ah" x10, ~5s, 80dB Ascending pitch glides x10, ~4s, 79dB Descending pitch glides x10, ~5s, 81dB  Pt co-created x10 functional phrases for HEP. Pt read aloud twice in both a higher pitch, loud voice and a lower pitch, authoritative voice averaging 84dB.  Conversation level training - pt averaged 74dB during 5 minutes of conversation. Evidence of volume decay noted. Pt benefited from cues to speak to as if someone was over on the other side of a fence.  Pt endorsed consuming ~6 cups of water this date. Pt working on increasing water intake to 6-8 glasses daily.     PATIENT EDUCATION: Education details: HEP, POC Person educated: Patient Education method: Explanation; Handout Education comprehension: verbalized understanding   HOME EXERCISE PROGRAM: Phonation Resistance Ex's     GOALS: Goals reviewed with patient? Yes  SHORT TERM GOALS: Target date: 10 sessions  Pt will improve water intake to 5-8 glasses per day per pt report. Baseline: Goal status: INITIAL  2.  The patient will demonstrate abdominal breathing patterns and steady release of breath on exhalation to optimize efficiency of voicing and decrease laryngeal hyperfunction with min cueing.  Baseline:  Goal status: INITIAL  3.  The patient will eliminate phonotraumatic behaviors such as chronic throat clearing, by substituting non-traumatic methods to clear mucus with min cueing.  Baseline:  Goal status: INITIAL  4.  The patient will utilize a forward tone focused/resonant voice to decrease vocal hyperfunction and improve voice quality and vocal projection with min cueing. Baseline:  Goal status: INITIAL  5.  Pt will perform HEP for voice with min cueing. Baseline:  Goal status: INITIAL  6.  The patient will participate in 5-8 minutes conversation, maintaining average loudness of 75 dB and loud, good quality voice with min cues.     Baseline:   Goal status: INITIAL  LONG TERM GOALS: Target date: 12 weeks  Pt will improve score on the VHI by 10% indicating improved perception of speech abilities. Baseline: 42/120 Goal status: INITIAL  2.  The patient will demonstrate abdominal breathing patterns and steady release of breath on exhalation to optimize efficiency of voicing and decrease laryngeal hyperfunction.  Baseline:  Goal status: INITIAL    3.  Pt will perform HEP for voice independently. Baseline:  Goal status: INITIAL  4.  The patient will participate in 15-20 minutes conversation, maintaining average loudness of 75 dB and loud, good quality voice with min cues.     Baseline:  Goal status: INITIAL  ASSESSMENT:  CLINICAL IMPRESSION: Patient is a 63 y.o. female who presents for voice treatment. 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 On endoscopy, 03/29/23, pt with weakness and bowing of B vocal cords with more weakness on L than R. Pt referred for course ST prior to consideration laryngologist referral per ENT note. Pt's voice with perceptual characteristics of breathiness, hoarseness, volume decay, and diplophonia. Pt with tendency to utilize clavicular breathing. Pt with stimulable to resonant voice therapy and clear speech therapy.  Pt reports dissatisfactoin with voice particularly in noisy environments or instances when she needs to project (e.g. bible study). See details of today's treatment above. Recommend course of speech therapy to improve pt's voice in social settings and for ADLs/iADLs.  OBJECTIVE IMPAIRMENTS include voice disorder. These impairments are limiting patient from effectively communicating at home and in community. Factors affecting potential to achieve goals and functional outcome are  overall medical status/comorbidities . Patient will benefit from skilled SLP services to address above impairments and improve overall function.  REHAB  POTENTIAL: Good  PLAN: SLP FREQUENCY: 1-2x/week  SLP DURATION: 12 weeks  PLANNED INTERVENTIONS: Cueing hierachy, Internal/external aids, Functional tasks, Multimodal communication approach, SLP instruction and feedback, Compensatory strategies, and Patient/family education    Clyde Canterbury, M.S., CCC-SLP Speech-Language Pathologist [place] [facility] [phone] Arnette Felts)  [facility] at [facility] [address] [place], [place], [address] Phone: [phone]   Fax:  [phone]

## 2023-04-07 ENCOUNTER — Ambulatory Visit: Payer: Medicaid Other

## 2023-04-07 ENCOUNTER — Ambulatory Visit: Payer: Medicaid Other | Admitting: Physical Therapy

## 2023-04-07 DIAGNOSIS — M6281 Muscle weakness (generalized): Secondary | ICD-10-CM

## 2023-04-07 DIAGNOSIS — R490 Dysphonia: Secondary | ICD-10-CM

## 2023-04-07 DIAGNOSIS — R262 Difficulty in walking, not elsewhere classified: Secondary | ICD-10-CM

## 2023-04-07 DIAGNOSIS — M436 Torticollis: Secondary | ICD-10-CM

## 2023-04-07 NOTE — Therapy (Signed)
OUTPATIENT PHYSICAL THERAPY TREATMENT     Patient Name: Kathryn Lucas MRN: [id number] DOB:November 26, 1960, 63 y.o., female Today's Date: 04/07/2023   PCP: Ollen Bowl, MD  REFERRING PROVIDER: [name], MD   END OF SESSION:  PT End of Session - 04/07/23 1025     Visit Number 21    Number of Visits 36    Date for PT Re-Evaluation 06/08/23    Authorization Type [place] [employer]    Authorization Time Period 03/16/23-06/08/23    Progress Note Due on Visit 30    PT Start Time 1100    PT Stop Time 1143    PT Time Calculation (min) 43 min    Equipment Utilized During Treatment Gait belt    Activity Tolerance Patient tolerated treatment well;No increased pain    Behavior During Therapy [facility] for tasks assessed/performed             Past Medical History:  Diagnosis Date   Breast cancer (HCC)    left breast cancer   Cancer (HCC) 09/2017   left breast cancer   Family history of breast cancer    Hypothyroidism    Personal history of radiation therapy 2019   Thyroid disease    Past Surgical History:  Procedure Laterality Date   BREAST BIOPSY Left 2018   BREAST BIOPSY Left 2019   BREAST RECONSTRUCTION WITH PLACEMENT OF TISSUE EXPANDER AND ALLODERM Left 10/26/2017   Procedure: LEFT BREAST RECONSTRUCTION WITH PLACEMENT OF TISSUE EXPANDER AND ALLODERM;  Surgeon: Glenna Fellows, MD;  Location: [facility];  Service: Plastics;  Laterality: Left;   MASTECTOMY Left 2019   MASTECTOMY WITH RADIOACTIVE SEED GUIDED EXCISION AND AXILLARY SENTINEL LYMPH NODE BIOPSY Left 10/26/2017   Procedure: LEFT MASTECTOMY WITH SEED TARGETED  LEFT AXILLARY LYMPH NODE EXCISION AND LEFT SENTINEL LYMPH NODE BIOPSY;  Surgeon: Almond Lint, MD;  Location: [facility];  Service: General;  Laterality: Left;   TONSILLECTOMY     WISDOM TOOTH EXTRACTION     Patient Active Problem List   Diagnosis Date Noted   Metastatic 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) 10/14/2022   Elevated liver enzymes  10/14/2022   Unexplained weight loss 10/14/2022   Neoplasm related pain 10/14/2022   Metastatic malignant neoplasm (HCC) 12/19/2021   Family history of breast cancer    History of therapeutic radiation 04/27/2018   Breast cancer, left breast (HCC) 10/26/2017   Malignant neoplasm of lower-outer quadrant of left breast of female, estrogen receptor positive (HCC) 07/15/2016   Plantar fasciitis 07/30/2015   Metatarsalgia 10/18/2014    ONSET DATE: September 2023   REFERRING DIAG:  Diagnosis  C79.51 (ICD-10-CM) - Secondary malignant neoplasm of bone    THERAPY DIAG:  Muscle weakness (generalized)  Stiffness of cervical spine  Difficulty in walking, not elsewhere classified  Rationale for Evaluation and Treatment: Rehabilitation  SUBJECTIVE:  SUBJECTIVE STATEMENT:  Pt reports she did a walk all the way around the mall yesterday and it went well. Some R bicep discomfort and soreness today and requests some exercises for strengthening this area.   PERTINENT HISTORY:   Pt is 63 y.o. female that is seeking therapy for weakness of the R LE. Pt tripped over the threshold of her door back in September of 2023, and had a transverse fracture of the L patella when she fell on it. She was recently diagnosed with bone cancer back in October of 2023. Pt notes that she has more stiffness in the neck and pelvis region because the cancer is located in those locations. Pt had exacerbation of s/s in June of 2024 with hospitalization and subsequent change in oncology treatment limiting ability to attend PT. Pt has responded well to treatment and now able to attend PT.   PAIN:  Are you having pain? No   PRECAUTIONS: Cervical and Other: cervical for comfort   RED FLAGS: None   WEIGHT BEARING RESTRICTIONS:  No  FALLS: Has patient fallen in last 6 months? Yes. Number of falls 1  LIVING ENVIRONMENT: Lives with: lives alone Lives in: House/apartment Stairs:  yes, but uses ramp  Has following equipment at home: Walker - 4 wheeled  PLOF: Needs assistance with ADLs and Needs assistance with homemaking  PATIENT GOALS: improve balance, strength and endurance. Stand at kitchen longer and   OBJECTIVE:   DIAGNOSTIC FINDINGS:  COGNITION: Overall cognitive status: Within functional limits for tasks assessed   SENSATION: WFL   LOWER EXTREMITY MMT:   MMT  Right Eval Left Eval    Hip flexion 4 4    Hip extension      Hip abduction 4 4    Hip adduction 4+ 4+    Hip internal rotation      Hip external rotation      Knee flexion 4 4    Knee extension 4+ 4+    Ankle dorsiflexion 5 5    Ankle plantarflexion 4 4    Ankle inversion      Ankle eversion       (Blank rows = not tested)    TODAY'S TREATMENT:                                                                                                                              DATE: 04/07/2023 TE Nustep level 2 x 3 min  Level 5 x 3 min   Seated shoulder rows x 10 RTB Kettle bell carry x 150 ft  R UE( farmer's carry) with 15#  Seated shoulder rows x 10 RTB Kettle bell carry x 150 ft  L UE( farmer's carry) with 15# Standing biceps curls 2 x 10 x 5# dumbbell hammer curl  Calf raises and heel raises 2 x 15  Bar shoulder flexion x 2 # x 10 reps  Sit<>stand 2 x 5  standard chair  Throughout session pt required occasional seated rest breaks due to mild SOB and LE fatigue. No LOB throughout session and reduced use of BUE with step ups compared to prior sessions with this PT.  Throughout session pt required occasional seated rest breaks due heart racing and LE fatigue. No LOB throughout session and reduced use of BUE with step ups compared to prior sessions with this PT.  PATIENT EDUCATION: Education details: POC.  Person educated:  Patient  Education method: Explanation Education comprehension: verbalized understanding  HOME EXERCISE PROGRAM: Access Code: [email or web address] URL: [email or web address] Date: 02/18/2023 Prepared by: Grier Rocher  Exercises - Mini Squat with Counter Support  - 1 x daily - 7 x weekly - 3 sets - 10 reps - Standing Hip Abduction with Counter Support  - 1 x daily - 7 x weekly - 3 sets - 10 reps - Standing March with Counter Support  - 1 x daily - 7 x weekly - 3 sets - 10 reps - Seated Long Arc Quad  - 1 x daily - 7 x weekly - 3 sets - 10 reps - Sidelying Hip Abduction  - 1 x daily - 7 x weekly - 3 sets - 10 reps - Supine Active Straight Leg Raise  - 1 x daily - 7 x weekly - 3 sets - 10 reps - Supine Quad Set  - 1 x daily - 7 x weekly - 3 sets - 10 reps - Clamshell  - 1 x daily - 7 x weekly - 3 sets - 10 reps - Seated Hip Abduction with Resistance  - 1 x daily - 7 x weekly - 3 sets - 10 reps - Seated Knee Extension with Resistance  - 1 x daily - 7 x weekly - 3 sets - 10 reps - Seated March with Resistance  - 1 x daily - 7 x weekly - 3 sets - 10 reps - Seated Heel Toe Raises  - 1 x daily - 7 x weekly - 3 sets - 10 reps - Seated Ankle Plantarflexion with Resistance  - 1 x daily - 7 x weekly - 3 sets - 10 reps  GOALS: Goals reviewed with patient? Yes  SHORT TERM GOALS: Target date: 01/13/2023   Patient will be independent in home exercise program to improve strength/mobility for better functional independence with ADLs. Baseline:to  given 9/30  10/29. Pt reports inconsistent HEP completion. Goal status: IN PROGRESS   LONG TERM GOALS: Target date: 03/03/2023    Patient will increase FOTO score to equal to or greater than  68   to demonstrate statistically significant improvement in mobility and quality of life.  Baseline: 63  10/29: 74 02/17/2023: 78 03/31/23: 73  Goal status: MET  2.  Patient (> 70 years old) will complete five times sit to stand test in < 15 seconds  without use of BUE indicating an increased LE strength and improved balance. Baseline: 15.82 with UE support  10/29: 13.84 with UE support push from seat  12/4: 11.84sec pushing from thighs.  1/15: 15.67 sec pushing from thighs standard seat height    Goal status: IN PROGRESS; revised    3.  Patient will increase Berg Balance score by > 6 points to demonstrate decreased fall risk during functional activities Baseline: 46; 10/29: 49; 12/4: 52 Goal status: MET  4.  Patient will increase 6 min walk test to >1454ft as to improve gait speed for improved ability to safely navigate the community for IADL.  Baseline: 950; 10/29  1154ft; 12/4: 1150  1/15:1172ft reports increased HR. Measured at 132.  Goal status: IN PROGRESS ; revised   5.  Patient will reduce timed up and go to <11 seconds to reduce fall risk and demonstrate improved transfer/gait ability. Baseline: 15.72 10/29: 9.97sec  12/4: 8.4 sec  1/15: 9.7sec Goal status: MET  6.  Patient will increase FGA score to >22 as to demonstrate reduced fall risk and improved dynamic gait balance for better safety with community/home ambulation.  Baseline:19 10/29 24 Goal status: MET   ASSESSMENT:  CLINICAL IMPRESSION:   Patient arrived with good motivation for completion of pt activities.  Pt wanted to focus on some UE exercise this date. Alternated between upper and lower body exercise. Instructed pt to continue to gradually increase Physical activity and mall walking was a good idea as it is level surface and safe environment for walking. Pt will continue to benefit from skilled physical therapy intervention to address impairments, improve QOL, and attain therapy goals.    OBJECTIVE IMPAIRMENTS: Abnormal gait, cardiopulmonary status limiting activity, decreased activity tolerance, decreased balance, decreased endurance, decreased knowledge of condition, decreased mobility, difficulty walking, decreased ROM, decreased strength,  hypomobility, increased fascial restrictions, impaired perceived functional ability, impaired flexibility, impaired UE functional use, improper body mechanics, postural dysfunction, and pain.   ACTIVITY LIMITATIONS: carrying, lifting, bending, standing, squatting, sleeping, stairs, transfers, bathing, toileting, dressing, reach over head, and locomotion level  PARTICIPATION LIMITATIONS: cleaning, laundry, driving, shopping, community activity, and yard work  PERSONAL FACTORS: Age, Behavior pattern, Fitness, Past/current experiences, and 1-2 comorbidities: CA with profound metastases   are also affecting patient's functional outcome.   REHAB POTENTIAL: Good  CLINICAL DECISION MAKING: Evolving/moderate complexity  EVALUATION COMPLEXITY: Moderate  PLAN:  PT FREQUENCY: 1-2x/week  PT DURATION: 12 weeks  PLANNED INTERVENTIONS: Therapeutic exercises, Therapeutic activity, Neuromuscular re-education, Balance training, Gait training, Patient/Family education, Self Care, Joint mobilization, Stair training, DME instructions, Cryotherapy, Moist heat, and Manual therapy  PLAN FOR NEXT SESSION:  Balance, strengthening for BLE/BUE.  Neck pain management as indicated.   Norman Herrlich PT ,DPT Physical Therapist- [facility]  [facility]   10:28 AM 04/07/23

## 2023-04-07 NOTE — Therapy (Signed)
OUTPATIENT SPEECH LANGUAGE PATHOLOGY  VOICE TREATMENT   Patient Name: Kathryn Lucas MRN: [id number] DOB:[birth year], 63 y.o., female Today's Date: 04/07/2023  PCP: Jacqulyn Bath, MD  REFERRING PROVIDER: Bonney Roussel, [name]   End of Session - 04/07/23 1148     Visit Number 3    Number of Visits 24    Date for SLP Re-Evaluation 06/23/23    Authorization Type [employer] order#[id number]    Authorization Time Period 1/20-3/20 for 8 SLP visits    Authorization - Visit Number 2    Authorization - Number of Visits 8    Progress Note Due on Visit 10    SLP Start Time 1020    SLP Stop Time  1100    SLP Time Calculation (min) 40 min    Activity Tolerance Patient tolerated treatment well             Past Medical History:  Diagnosis Date   Breast cancer (HCC)    left breast cancer   Cancer (HCC) 09/2017   left breast cancer   Family history of breast cancer    Hypothyroidism    Personal history of radiation therapy 2019   Thyroid disease    Past Surgical History:  Procedure Laterality Date   BREAST BIOPSY Left 2018   BREAST BIOPSY Left 2019   BREAST RECONSTRUCTION WITH PLACEMENT OF TISSUE EXPANDER AND ALLODERM Left 10/26/2017   Procedure: LEFT BREAST RECONSTRUCTION WITH PLACEMENT OF TISSUE EXPANDER AND ALLODERM;  Surgeon: Glenna Fellows, MD;  Location: [facility];  Service: Plastics;  Laterality: Left;   MASTECTOMY Left 2019   MASTECTOMY WITH RADIOACTIVE SEED GUIDED EXCISION AND AXILLARY SENTINEL LYMPH NODE BIOPSY Left 10/26/2017   Procedure: LEFT MASTECTOMY WITH SEED TARGETED  LEFT AXILLARY LYMPH NODE EXCISION AND LEFT SENTINEL LYMPH NODE BIOPSY;  Surgeon: Almond Lint, MD;  Location: [facility];  Service: General;  Laterality: Left;   TONSILLECTOMY     WISDOM TOOTH EXTRACTION     Patient Active Problem List   Diagnosis Date Noted   Metastatic 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) 10/14/2022   Elevated liver enzymes 10/14/2022   Unexplained weight loss 10/14/2022    Neoplasm related pain 10/14/2022   Metastatic malignant neoplasm (HCC) 12/19/2021   Family history of breast cancer    History of therapeutic radiation 04/27/2018   Breast cancer, left breast (HCC) 10/26/2017   Malignant neoplasm of lower-outer quadrant of left breast of female, estrogen receptor positive (HCC) 07/15/2016   Plantar fasciitis 07/30/2015   Metatarsalgia 10/18/2014    ONSET DATE:  03/29/23 (referral); since July 2024 per pt  REFERRING DIAG: Hoarseness  THERAPY DIAG:  Dysphonia  Hoarseness  Rationale for Evaluation and Treatment Rehabilitation  SUBJECTIVE:   SUBJECTIVE STATEMENT: Pt alert, pleasant, and cooperative. Dysphonia apparent. Pt accompanied by: self  PERTINENT HISTORY: 63 y.o. female who presents for voice evaluation. 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 On endoscopy, 1/13, pt with weakness and bowing of B vocal cords with more weakness on L than R. Pt referred for course ST prior to consideration laryngologist referral per ENT note.  DIAGNOSTIC FINDINGS: 02/18/23, CT Chest Abdomen Pelvis, "1. Hypodense lesion of the medial liver dome, in retrospective review diminished in size compared to prior PET-CT examination. Findings are consistent with treatment response of hepatic metastatic disease. 2. Slightly increased sclerosis of widespread mixed lytic and sclerotic osseous metastatic disease throughout the axial skeleton, consistent with treatment response. 3. Unchanged post  treatment/post radiation appearance of the right chest, with perihilar fibrosis and consolidation. 4. Status post left mastectomy with saline spacer."  PAIN:  Are you having pain? No (FACES)   FALLS: defer to PT  LIVING ENVIRONMENT: Lives with: lives alone   PLOF: Independent  PATIENT GOALS    for voice to be stronger  OBJECTIVE:  TODAY'S TREATMENT: Introduced Passenger transport manager ex's. Pt completed as  follows with use of biofeedback and min/mod verbal cues: Sustained "ah" x10, ~5-6s, 80dB Ascending pitch glides x10, ~4s, 80dB Descending pitch glides x10, ~5s, 80dB Functional phrases: Pt read aloud twice in both a higher pitch, loud voice and a lower pitch, authoritative voice averaging 83dB.  Conversation level training - pt averaged 75dB during 10 minutes of conversation. No evidence of volume decay this date. Pt stating that she is more of a "morning person" and her voice tends to be stronger in the morning.   Introduced Higher education careers adviser concepts of vocal rest/naps. Also, pt shared that she sometimes shouts at her dog when her dog is barking. Discussed utilizing non-phonotraumatic attention getting strategies with her dog.   Pt endorsed consuming ~6 cups of water this date. Pt working on increasing water intake to 6-8 glasses daily.     PATIENT EDUCATION: Education details: HEP, POC Person educated: Patient Education method: Explanation; Handout Education comprehension: verbalized understanding   HOME EXERCISE PROGRAM: Phonation Resistance Ex's     GOALS: Goals reviewed with patient? Yes  SHORT TERM GOALS: Target date: 10 sessions  Pt will improve water intake to 5-8 glasses per day per pt report. Baseline: Goal status: INITIAL  2.  The patient will demonstrate abdominal breathing patterns and steady release of breath on exhalation to optimize efficiency of voicing and decrease laryngeal hyperfunction with min cueing.  Baseline:  Goal status: INITIAL  3.  The patient will eliminate phonotraumatic behaviors such as chronic throat clearing, by substituting non-traumatic methods to clear mucus with min cueing.  Baseline:  Goal status: INITIAL  4.  The patient will utilize a forward tone focused/resonant voice to decrease vocal hyperfunction and improve voice quality and vocal projection with min cueing. Baseline:  Goal status: INITIAL  5.  Pt will perform HEP for voice  with min cueing. Baseline:  Goal status: INITIAL  6.  The patient will participate in 5-8 minutes conversation, maintaining average loudness of 75 dB and loud, good quality voice with min cues.     Baseline:  Goal status: INITIAL  LONG TERM GOALS: Target date: 12 weeks  Pt will improve score on the VHI by 10% indicating improved perception of speech abilities. Baseline: 42/120 Goal status: INITIAL  2.  The patient will demonstrate abdominal breathing patterns and steady release of breath on exhalation to optimize efficiency of voicing and decrease laryngeal hyperfunction.  Baseline:  Goal status: INITIAL    3.  Pt will perform HEP for voice independently. Baseline:  Goal status: INITIAL  4.  The patient will participate in 15-20 minutes conversation, maintaining average loudness of 75 dB and loud, good quality voice with min cues.     Baseline:  Goal status: INITIAL  ASSESSMENT:  CLINICAL IMPRESSION: Patient is a 63 y.o. female who presents for voice treatment. 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 On endoscopy, 03/29/23, pt with weakness and bowing of B vocal cords with more weakness on L than R. Pt referred for course ST prior to consideration laryngologist referral per ENT  note. Pt's voice with perceptual characteristics of breathiness, hoarseness, volume decay, and diplophonia. Pt with tendency to utilize clavicular breathing. Pt with stimulable to resonant voice therapy and clear speech therapy. Pt reports dissatisfactoin with voice particularly in noisy environments or instances when she needs to project (e.g. bible study). See details of today's treatment above. Recommend course of speech therapy to improve pt's voice in social settings and for ADLs/iADLs.  OBJECTIVE IMPAIRMENTS include voice disorder. These impairments are limiting patient from effectively communicating at home and in community. Factors affecting  potential to achieve goals and functional outcome are  overall medical status/comorbidities . Patient will benefit from skilled SLP services to address above impairments and improve overall function.  REHAB POTENTIAL: Good  PLAN: SLP FREQUENCY: 1-2x/week  SLP DURATION: 12 weeks  PLANNED INTERVENTIONS: Cueing hierachy, Internal/external aids, Functional tasks, Multimodal communication approach, SLP instruction and feedback, Compensatory strategies, and Patient/family education    Clyde Canterbury, M.S., CCC-SLP Speech-Language Pathologist [facility] [phone] Arnette Felts)  [facility] [address] [place], [place], [address] Phone: [phone]   Fax:  [phone]

## 2023-04-11 ENCOUNTER — Encounter: Payer: Self-pay | Admitting: Hematology and Oncology

## 2023-04-12 ENCOUNTER — Ambulatory Visit: Payer: Medicaid Other

## 2023-04-12 ENCOUNTER — Other Ambulatory Visit: Payer: Self-pay | Admitting: *Deleted

## 2023-04-12 DIAGNOSIS — R490 Dysphonia: Secondary | ICD-10-CM

## 2023-04-12 DIAGNOSIS — Z170 Estrogen receptor positive status [ER+]: Secondary | ICD-10-CM

## 2023-04-12 DIAGNOSIS — M6281 Muscle weakness (generalized): Secondary | ICD-10-CM | POA: Diagnosis not present

## 2023-04-12 MED FILL — Fosaprepitant Dimeglumine For IV Infusion 150 MG (Base Eq): INTRAVENOUS | Qty: 5 | Status: AC

## 2023-04-12 NOTE — Therapy (Signed)
OUTPATIENT SPEECH LANGUAGE PATHOLOGY  VOICE TREATMENT   Patient Name: Kathryn Lucas MRN: [id number] DOB:[birth year], 63 y.o., female Today's Date: 04/12/2023  PCP: Jacqulyn Bath, MD  REFERRING PROVIDER: Bonney Roussel, [name]   End of Session - 04/12/23 1457     Visit Number 4    Number of Visits 24    Date for SLP Re-Evaluation 06/23/23    Authorization Type carelon order#[id number]    Authorization Time Period 1/20-3/20 for 8 SLP visits    Authorization - Visit Number 3    Authorization - Number of Visits 8    Progress Note Due on Visit 10    SLP Start Time 1405    SLP Stop Time  1450    SLP Time Calculation (min) 45 min    Activity Tolerance Patient tolerated treatment well             Past Medical History:  Diagnosis Date   Breast cancer (HCC)    left breast cancer   Cancer (HCC) 09/2017   left breast cancer   Family history of breast cancer    Hypothyroidism    Personal history of radiation therapy 2019   Thyroid disease    Past Surgical History:  Procedure Laterality Date   BREAST BIOPSY Left 2018   BREAST BIOPSY Left 2019   BREAST RECONSTRUCTION WITH PLACEMENT OF TISSUE EXPANDER AND ALLODERM Left 10/26/2017   Procedure: LEFT BREAST RECONSTRUCTION WITH PLACEMENT OF TISSUE EXPANDER AND ALLODERM;  Surgeon: Glenna Fellows, MD;  Location: [facility];  Service: Plastics;  Laterality: Left;   MASTECTOMY Left 2019   MASTECTOMY WITH RADIOACTIVE SEED GUIDED EXCISION AND AXILLARY SENTINEL LYMPH NODE BIOPSY Left 10/26/2017   Procedure: LEFT MASTECTOMY WITH SEED TARGETED  LEFT AXILLARY LYMPH NODE EXCISION AND LEFT SENTINEL LYMPH NODE BIOPSY;  Surgeon: Almond Lint, MD;  Location: [facility];  Service: General;  Laterality: Left;   TONSILLECTOMY     WISDOM TOOTH EXTRACTION     Patient Active Problem List   Diagnosis Date Noted   Metastatic 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) 10/14/2022   Elevated liver enzymes 10/14/2022   Unexplained weight loss 10/14/2022    Neoplasm related pain 10/14/2022   Metastatic malignant neoplasm (HCC) 12/19/2021   Family history of breast cancer    History of therapeutic radiation 04/27/2018   Breast cancer, left breast (HCC) 10/26/2017   Malignant neoplasm of lower-outer quadrant of left breast of female, estrogen receptor positive (HCC) 07/15/2016   Plantar fasciitis 07/30/2015   Metatarsalgia 10/18/2014    ONSET DATE:  03/29/23 (referral); since July 2024 per pt  REFERRING DIAG: Hoarseness  THERAPY DIAG:  Dysphonia  Hoarseness  Rationale for Evaluation and Treatment Rehabilitation  SUBJECTIVE:   SUBJECTIVE STATEMENT: Pt alert, pleasant, and cooperative. Dysphonia apparent. Pt accompanied by: self  PERTINENT HISTORY: 63 y.o. female who presents for voice evaluation. 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 On endoscopy, 1/13, pt with weakness and bowing of B vocal cords with more weakness on L than R. Pt referred for course ST prior to consideration laryngologist referral per ENT note.  DIAGNOSTIC FINDINGS: 02/18/23, CT Chest Abdomen Pelvis, "1. Hypodense lesion of the medial liver dome, in retrospective review diminished in size compared to prior PET-CT examination. Findings are consistent with treatment response of hepatic metastatic disease. 2. Slightly increased sclerosis of widespread mixed lytic and sclerotic osseous metastatic disease throughout the axial skeleton, consistent with treatment response. 3. Unchanged post  treatment/post radiation appearance of the right chest, with perihilar fibrosis and consolidation. 4. Status post left mastectomy with saline spacer."  PAIN:  Are you having pain? No (FACES)   FALLS: defer to PT  LIVING ENVIRONMENT: Lives with: lives alone   PLOF: Independent  PATIENT GOALS    for voice to be stronger  OBJECTIVE:  TODAY'S TREATMENT: Introduced Passenger transport manager ex's. Pt completed as  follows with use of biofeedback and min/mod verbal cues: Sustained "ah" x10, ~5-6s, 82dB Ascending pitch glides x10, ~4s, 80dB Descending pitch glides x10, ~5s, 81dB Functional phrases: Pt read aloud twice in both a higher pitch, loud voice and a lower pitch, authoritative voice averaging 84dB.  Conversation level training - pt averaged 74dB during 10 minutes of conversation. Mild volume decay this date.      PATIENT EDUCATION: Education details: HEP, POC Person educated: Patient Education method: Explanation; Handout Education comprehension: verbalized understanding   HOME EXERCISE PROGRAM: Phonation Resistance Ex's     GOALS: Goals reviewed with patient? Yes  SHORT TERM GOALS: Target date: 10 sessions  Pt will improve water intake to 5-8 glasses per day per pt report. Baseline: Goal status: INITIAL  2.  The patient will demonstrate abdominal breathing patterns and steady release of breath on exhalation to optimize efficiency of voicing and decrease laryngeal hyperfunction with min cueing.  Baseline:  Goal status: INITIAL  3.  The patient will eliminate phonotraumatic behaviors such as chronic throat clearing, by substituting non-traumatic methods to clear mucus with min cueing.  Baseline:  Goal status: INITIAL  4.  The patient will utilize a forward tone focused/resonant voice to decrease vocal hyperfunction and improve voice quality and vocal projection with min cueing. Baseline:  Goal status: INITIAL  5.  Pt will perform HEP for voice with min cueing. Baseline:  Goal status: INITIAL  6.  The patient will participate in 5-8 minutes conversation, maintaining average loudness of 75 dB and loud, good quality voice with min cues.     Baseline:  Goal status: INITIAL  LONG TERM GOALS: Target date: 12 weeks  Pt will improve score on the VHI by 10% indicating improved perception of speech abilities. Baseline: 42/120 Goal status: INITIAL  2.  The patient will  demonstrate abdominal breathing patterns and steady release of breath on exhalation to optimize efficiency of voicing and decrease laryngeal hyperfunction.  Baseline:  Goal status: INITIAL    3.  Pt will perform HEP for voice independently. Baseline:  Goal status: INITIAL  4.  The patient will participate in 15-20 minutes conversation, maintaining average loudness of 75 dB and loud, good quality voice with min cues.     Baseline:  Goal status: INITIAL  ASSESSMENT:  CLINICAL IMPRESSION: Patient is a 63 y.o. female who presents for voice treatment. 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 On endoscopy, 03/29/23, pt with weakness and bowing of B vocal cords with more weakness on L than R. Pt referred for course ST prior to consideration laryngologist referral per ENT note. Pt's voice with perceptual characteristics of breathiness, hoarseness, volume decay, and diplophonia. Pt with tendency to utilize clavicular breathing. Pt with stimulable to resonant voice therapy and clear speech therapy. Pt reports dissatisfactoin with voice particularly in noisy environments or instances when she needs to project (e.g. bible study). See details of today's treatment above. Recommend course of speech therapy to improve pt's voice in social settings and for ADLs/iADLs.  OBJECTIVE IMPAIRMENTS include  voice disorder. These impairments are limiting patient from effectively communicating at home and in community. Factors affecting potential to achieve goals and functional outcome are  overall medical status/comorbidities . Patient will benefit from skilled SLP services to address above impairments and improve overall function.  REHAB POTENTIAL: Good  PLAN: SLP FREQUENCY: 1-2x/week  SLP DURATION: 12 weeks  PLANNED INTERVENTIONS: Cueing hierachy, Internal/external aids, Functional tasks, Multimodal communication approach, SLP instruction and feedback,  Compensatory strategies, and Patient/family education    Clyde Canterbury, M.S., CCC-SLP Speech-Language Pathologist [facility] [phone] Arnette Felts)  [facility] at [facility] [address] [place], [place], [address] Phone: [phone]   Fax:  [phone]

## 2023-04-13 ENCOUNTER — Other Ambulatory Visit: Payer: Self-pay

## 2023-04-13 ENCOUNTER — Inpatient Hospital Stay: Payer: Medicaid Other

## 2023-04-13 ENCOUNTER — Ambulatory Visit: Payer: Medicaid Other | Admitting: Hematology and Oncology

## 2023-04-13 ENCOUNTER — Other Ambulatory Visit: Payer: Medicaid Other

## 2023-04-13 ENCOUNTER — Inpatient Hospital Stay: Payer: Medicaid Other | Admitting: Hematology and Oncology

## 2023-04-13 ENCOUNTER — Ambulatory Visit: Payer: Medicaid Other

## 2023-04-13 VITALS — BP 107/70 | HR 96 | Temp 98.2°F | Resp 16

## 2023-04-13 VITALS — BP 133/82 | HR 112 | Temp 98.3°F | Resp 18 | Ht 70.0 in | Wt 153.4 lb

## 2023-04-13 DIAGNOSIS — Z170 Estrogen receptor positive status [ER+]: Secondary | ICD-10-CM | POA: Diagnosis not present

## 2023-04-13 DIAGNOSIS — C50512 Malignant neoplasm of lower-outer quadrant of left female breast: Secondary | ICD-10-CM

## 2023-04-13 DIAGNOSIS — Z5112 Encounter for antineoplastic immunotherapy: Secondary | ICD-10-CM | POA: Diagnosis not present

## 2023-04-13 DIAGNOSIS — C7951 Secondary malignant neoplasm of bone: Secondary | ICD-10-CM

## 2023-04-13 LAB — CBC WITH DIFFERENTIAL (CANCER CENTER ONLY)
Abs Immature Granulocytes: 0.01 10*3/uL (ref 0.00–0.07)
Basophils Absolute: 0 10*3/uL (ref 0.0–0.1)
Basophils Relative: 1 %
Eosinophils Absolute: 0.1 10*3/uL (ref 0.0–0.5)
Eosinophils Relative: 2 %
HCT: 38.3 % (ref 36.0–46.0)
Hemoglobin: 12.4 g/dL (ref 12.0–15.0)
Immature Granulocytes: 0 %
Lymphocytes Relative: 11 %
Lymphs Abs: 0.4 10*3/uL — ABNORMAL LOW (ref 0.7–4.0)
MCH: 33.4 pg (ref 26.0–34.0)
MCHC: 32.4 g/dL (ref 30.0–36.0)
MCV: 103.2 fL — ABNORMAL HIGH (ref 80.0–100.0)
Monocytes Absolute: 0.5 10*3/uL (ref 0.1–1.0)
Monocytes Relative: 13 %
Neutro Abs: 2.7 10*3/uL (ref 1.7–7.7)
Neutrophils Relative %: 73 %
Platelet Count: 236 10*3/uL (ref 150–400)
RBC: 3.71 MIL/uL — ABNORMAL LOW (ref 3.87–5.11)
RDW: 17.4 % — ABNORMAL HIGH (ref 11.5–15.5)
WBC Count: 3.7 10*3/uL — ABNORMAL LOW (ref 4.0–10.5)
nRBC: 0 % (ref 0.0–0.2)

## 2023-04-13 LAB — CMP (CANCER CENTER ONLY)
ALT: 10 U/L (ref 0–44)
AST: 32 U/L (ref 15–41)
Albumin: 3.7 g/dL (ref 3.5–5.0)
Alkaline Phosphatase: 95 U/L (ref 38–126)
Anion gap: 5 (ref 5–15)
BUN: 14 mg/dL (ref 8–23)
CO2: 26 mmol/L (ref 22–32)
Calcium: 9.6 mg/dL (ref 8.9–10.3)
Chloride: 108 mmol/L (ref 98–111)
Creatinine: 0.65 mg/dL (ref 0.44–1.00)
GFR, Estimated: >60 mL/min (ref >60)
Glucose, Bld: 75 mg/dL (ref 70–99)
Potassium: 4.2 mmol/L (ref 3.5–5.1)
Sodium: 139 mmol/L (ref 135–145)
Total Bilirubin: 0.4 mg/dL (ref 0.0–1.2)
Total Protein: 6.3 g/dL — ABNORMAL LOW (ref 6.5–8.1)

## 2023-04-13 MED ORDER — DEXTROSE 5 % IV SOLN: Freq: Once | INTRAVENOUS | Status: AC

## 2023-04-13 MED ORDER — DIPHENHYDRAMINE HCL 25 MG PO CAPS
50.0000 mg | ORAL_CAPSULE | Freq: Once | ORAL | Status: AC
  Administered 2023-04-13: 50 mg via ORAL
  Filled 2023-04-13: qty 2

## 2023-04-13 MED ORDER — PALONOSETRON HCL INJECTION 0.25 MG/5ML
0.2500 mg | Freq: Once | INTRAVENOUS | Status: AC
Start: 2023-04-13 — End: 2023-04-13
  Administered 2023-04-13: 0.25 mg via INTRAVENOUS
  Filled 2023-04-13: qty 5

## 2023-04-13 MED ORDER — ACETAMINOPHEN 325 MG PO TABS
650.0000 mg | ORAL_TABLET | Freq: Once | ORAL | Status: AC
Start: 2023-04-13 — End: 2023-04-13
  Administered 2023-04-13: 650 mg via ORAL
  Filled 2023-04-13: qty 2

## 2023-04-13 MED ORDER — SODIUM CHLORIDE 0.9 % IV SOLN
150.0000 mg | Freq: Once | INTRAVENOUS | Status: AC
  Administered 2023-04-13: 150 mg via INTRAVENOUS
  Filled 2023-04-13: qty 150

## 2023-04-13 MED ORDER — FAM-TRASTUZUMAB DERUXTECAN-NXKI CHEMO 100 MG IV SOLR
3.2000 mg/kg | Freq: Once | INTRAVENOUS | Status: AC
  Administered 2023-04-13: 200 mg via INTRAVENOUS
  Filled 2023-04-13: qty 10

## 2023-04-13 MED ORDER — DEXAMETHASONE SODIUM PHOSPHATE 10 MG/ML IJ SOLN
10.0000 mg | Freq: Once | INTRAMUSCULAR | Status: AC
  Administered 2023-04-13: 10 mg via INTRAVENOUS
  Filled 2023-04-13: qty 1

## 2023-04-13 NOTE — Patient Instructions (Signed)
[facility] - A DEPT OF [facility]  Discharge Instructions: Thank you for choosing [facility] to provide your oncology and hematology care.   If you have a lab appointment with the Cancer Center, please go directly to the Cancer Center and check in at the registration area.   Wear comfortable clothing and clothing appropriate for easy access to any Portacath or PICC line.   We strive to give you quality time with your provider. You may need to reschedule your appointment if you arrive late (15 or more minutes).  Arriving late affects you and other patients whose appointments are after yours.  Also, if you miss three or more appointments without notifying the office, you may be dismissed from the clinic at the provider's discretion.      For prescription refill requests, have your pharmacy contact our office and allow 72 hours for refills to be completed.    Today you received the following chemotherapy and/or immunotherapy agents: Enhertu      To help prevent nausea and vomiting after your treatment, we encourage you to take your nausea medication as directed.  BELOW ARE SYMPTOMS THAT SHOULD BE REPORTED IMMEDIATELY: *FEVER GREATER THAN 100.4 F (38 C) OR HIGHER *CHILLS OR SWEATING *NAUSEA AND VOMITING THAT IS NOT CONTROLLED WITH YOUR NAUSEA MEDICATION *UNUSUAL SHORTNESS OF BREATH *UNUSUAL BRUISING OR BLEEDING *URINARY PROBLEMS (pain or burning when urinating, or frequent urination) *BOWEL PROBLEMS (unusual diarrhea, constipation, pain near the anus) TENDERNESS IN MOUTH AND THROAT WITH OR WITHOUT PRESENCE OF ULCERS (sore throat, sores in mouth, or a toothache) UNUSUAL RASH, SWELLING OR PAIN  UNUSUAL VAGINAL DISCHARGE OR ITCHING   Items with * indicate a potential emergency and should be followed up as soon as possible or go to the Emergency Department if any problems should occur.  Please show the CHEMOTHERAPY ALERT CARD or IMMUNOTHERAPY  ALERT CARD at check-in to the Emergency Department and triage nurse.  Should you have questions after your visit or need to cancel or reschedule your appointment, please contact [facility] - A DEPT OF [facility]  Dept: [phone]  and follow the prompts.  Office hours are 8:00 a.m. to 4:30 p.m. Monday - Friday. Please note that voicemails left after 4:00 p.m. may not be returned until the following business day.  We are closed weekends and major holidays. You have access to a nurse at all times for urgent questions. Please call the main number to the clinic Dept: [phone] and follow the prompts.   For any non-urgent questions, you may also contact your provider using MyChart. We now offer e-Visits for anyone 57 and older to request care online for non-urgent symptoms. For details visit [email or web address].   Also download the MyChart app! Go to the app store, search "MyChart", open the app, select [facility], and log in with your MyChart username and password.

## 2023-04-13 NOTE — Progress Notes (Signed)
Patient Care Team: Ollen Bowl, MD as PCP - General (Internal Medicine) [name], MD as Consulting Physician (Hematology and Oncology) Almond Lint, MD as Consulting Physician (General Surgery) Dorothy Puffer, MD as Consulting Physician (Radiation Oncology) Carmina Miller, MD as Consulting Physician (Radiation Oncology)  DIAGNOSIS:  Encounter Diagnoses  Name Primary?   Malignant neoplasm of lower-outer quadrant of left breast of female, estrogen receptor positive (HCC) Yes   Malignant neoplasm metastatic to bone (HCC)     SUMMARY OF ONCOLOGIC HISTORY: Oncology History  Malignant neoplasm of lower-outer quadrant of left breast of female, estrogen receptor positive (HCC)  06/11/2016 Mammogram   Palpable left breast masses 3:00 position: 2.2 cm; 5:30 position: 2.5 cm; 6:30 position: 0.7 cm   06/19/2016 Initial Diagnosis   Left breast biopsy 3:30: IDC with DCIS grade 1, ER 90%, PR 50%, Ki-67 15%, HER-2 negative ratio 1.13; biopsy 5:30 position: IDC grade 1   07/13/2016 Breast MRI   Large area of abnormal enhancement lower inner and lower outer quadrants left breast spanning 9 cm x 6.4 cm x 5.3 cm, no abnormal enlarged lymph nodes; T3 N0 stage II a (New AJCC staging)    07/15/2016 - 12/08/2017 Anti-estrogen oral therapy   Neoadjuvant anastrozole 1 mg daily   07/17/2016 Oncotype testing   Testing done on the biopsy: Oncotype DX score 22, intermediate risk   02/02/2017 Breast MRI   Left breast multicentric disease unchanged measuring 2.7 x 1.6 cm.  Mass in the non-mass enhancement are also not significantly changed measuring 6.2 x 2.4 cm. new enhancing mass within the outer right breast 7 mm which could be fat necrosis or inclusion cyst    02/09/2017 Imaging   Ultrasound of the right breast lesion noted on MRI: No sonographic finding corresponds to the abnormality noted on MRI   07/13/2017 Cancer Staging   Staging form: Breast, AJCC 8th Edition - Clinical stage from 07/13/2017:  Stage IIA (cT3, cN0, cM0, G1, ER+, PR+, HER2-) - Signed by Loa Socks, NP on 05/18/2018   10/26/2017 Surgery   Left mastectomy: IDC grade 1, 2 foci largest spans 8.5 cm, intermediate grade DCIS, lymphovascular invasion identified, perineural invasion identified, 1/2 lymph nodes positive with extracapsular extension, ER 9200%, PR 5 to 50%, HER-2 negative, Ki-67 10 to 15%, T3N1A Mammaprint: low risk   11/02/2017 Cancer Staging   Staging form: Breast, AJCC 8th Edition - Pathologic: No Stage Recommended (ypT3, pN1a, cM0, G1, ER+, PR+, HER2-) - Signed by [name], MD on 11/02/2017   12/08/2017 - 01/26/2018 Radiation Therapy   Adjuvant radiation therapy    02/2018 -  Anti-estrogen oral therapy   Anastrozole 1 mg daily adjuvant therapy   10/21/2022 -  Chemotherapy   Patient is on Treatment Plan : BREAST METASTATIC Fam-Trastuzumab Deruxtecan-nxki (Enhertu) (5.4) q21d       CHIEF COMPLIANT: Cycle 9 Enhertu  HISTORY OF PRESENT ILLNESS:   History of Present Illness   The patient is a 63 year old who presents for a follow-up visit regarding her vocal cord dysfunction and general health maintenance. She is accompanied by her friend, Maralyn Sago.  Vocal cord dysfunction has shown improvement following regular speech therapy sessions. The left vocal cord is not moving optimally, but therapy has led to significant progress, notably when she was able to yell at her dog. She has been diligent in practicing exercises at home.  There is concern about calcium levels, prompting increased consumption of yogurt and supplements. She is awaiting lab results to determine  if there has been an improvement.  Efforts to increase physical activity have been successful, with a recent walk around a local mall completed without an increase in pulse rate. She has gained five pounds, attributed to eating more homemade food rather than sugar. She wants to improve her red blood cell count through exercise, although  current levels are normal.  Physical therapy sessions have included carrying weights, which has been challenging for her arms. There is some hesitation about getting a port for easier blood draws due to concerns about her arm's condition after previous treatments.         ALLERGIES:  is allergic to percocet [oxycodone-acetaminophen].  MEDICATIONS:  Current Outpatient Medications  Medication Sig Dispense Refill   azelastine (ASTELIN) 0.1 % nasal spray Place into both nostrils 2 (two) times daily. (Patient not taking: Reported on 11/03/2022)     Calcium 500-100 MG-UNIT CHEW Chew 1 tablet by mouth daily. 60 tablet    cetirizine (ZYRTEC) 10 MG tablet Take by mouth. (Patient not taking: Reported on 11/03/2022)     cholecalciferol (VITAMIN D3) 25 MCG (1000 UNIT) tablet Take 1 tablet (1,000 Units total) by mouth daily. (Patient not taking: Reported on 11/03/2022)     dexamethasone (DECADRON) 4 MG tablet Take 1 tab for 2 days after chemo (Patient not taking: Reported on 12/28/2022) 30 tablet 1   furosemide (LASIX) 20 MG tablet Take 1 tablet (20 mg total) by mouth daily. If needed may take 1/2 tab q am 30 tablet 0   levothyroxine (SYNTHROID) 112 MCG tablet Take 1 tablet by mouth daily.     LORazepam (ATIVAN) 0.5 MG tablet Take 1 tablet (0.5 mg total) by mouth at bedtime. (Patient not taking: Reported on 11/03/2022) 30 tablet 0   omeprazole (PRILOSEC) 20 MG capsule Take 1 capsule (20 mg total) by mouth daily. 30 capsule 3   ondansetron (ZOFRAN) 8 MG tablet Take 1 tablet (8 mg total) by mouth every 8 (eight) hours as needed for nausea or vomiting. Start on the third day after chemotherapy. (Patient not taking: Reported on 11/03/2022) 30 tablet 1   pantoprazole (PROTONIX) 40 MG tablet 1 tablet Orally Once a day for 30 days Take 1 tablet twice daily for 2 weeks then take 1 tablet daily. (Patient not taking: Reported on 12/28/2022)     prochlorperazine (COMPAZINE) 10 MG tablet Take 1 tablet (10 mg total) by  mouth every 6 (six) hours as needed for nausea or vomiting. (Patient not taking: Reported on 11/03/2022) 30 tablet 1   sucralfate (CARAFATE) 1 g tablet TAKE 1 TABLET BY MOUTH 4 TIMES DAILY WITH MEALS AND AT BEDTIME. DISSOLVE IN WARM WATER, SWISH AND SWALLOW. 120 tablet 1   vitamin C (ASCORBIC ACID) 250 MG tablet Take 1 tablet (250 mg total) by mouth daily.     No current facility-administered medications for this visit.    PHYSICAL EXAMINATION: ECOG PERFORMANCE STATUS: 1 - Symptomatic but completely ambulatory  Vitals:   04/13/23 1405  BP: 133/82  Pulse: (!) 112  Resp: 18  Temp: 98.3 F (36.8 C)  SpO2: 98%   Filed Weights   04/13/23 1405  Weight: 153 lb 6.4 oz (69.6 kg)    Physical Exam          (exam performed in the presence of a chaperone)  LABORATORY DATA:  I have reviewed the data as listed    Latest Ref Rng & Units 04/13/2023    1:45 PM 03/23/2023    1:07 PM 03/02/2023  1:25 PM  CMP  Glucose 70 - 99 mg/dL 75  161  94   BUN 8 - 23 mg/dL 14  14  7    Creatinine 0.44 - 1.00 mg/dL 0.96  0.45  4.09   Sodium 135 - 145 mmol/L 139  137  138   Potassium 3.5 - 5.1 mmol/L 4.2  4.2  4.0   Chloride 98 - 111 mmol/L 108  108  106   CO2 22 - 32 mmol/L 26  23  25    Calcium 8.9 - 10.3 mg/dL 9.6  8.7  9.5   Total Protein 6.5 - 8.1 g/dL 6.3  6.4  6.8   Total Bilirubin 0.0 - 1.2 mg/dL 0.4  0.3  0.4   Alkaline Phos 38 - 126 U/L 95  95  126   AST 15 - 41 U/L 32  26  34   ALT 0 - 44 U/L 10  10  12      Lab Results  Component Value Date   WBC 3.7 (L) 04/13/2023   HGB 12.4 04/13/2023   HCT 38.3 04/13/2023   MCV 103.2 (H) 04/13/2023   PLT 236 04/13/2023   NEUTROABS 2.7 04/13/2023    ASSESSMENT & PLAN:  Malignant neoplasm of lower-outer quadrant of left breast of female, estrogen receptor positive (HCC) 10/26/17: Left mastectomy: IDC grade 1, 2 foci largest spans 8.5 cm, intermediate grade DCIS, lymphovascular invasion identified, perineural invasion identified, 1/2 lymph nodes  positive with extracapsular extension, ER 9200%, PR 5 to 50%, HER-2 negative, Ki-67 10 to 15%, T3N1A   Oncotype DX score 22, intermediate risk, chemotherapy not felt to have significant benefit.   Treatment Summary: 1. Antiestrogen therapy with anastrozole 1 mg daily started 07/15/2016 2. Mastectomy 10/26/2017, Mammaprint low risk luminal type A 3. Followed by adjuvant radiation 12/08/17- 01/26/18  4. Followed by adjuvant antiestrogen therapy anastrozole started 01/17/2018 (originally started 07/15/2016) -------------------------------------------------------------------- Low back pain August 2023: Underwent CT myelogram: Large expansile lesion in the sacrum with extraosseous extension of the tumor, diffuse lytic lesions throughout the visualized spine with metastatic disease myeloma is considered less likely.  (This was ordered by Dr. Marcene Corning)   Treatment plan: 1.  PET CT scan 12/27/2021: Widespread bone metastatic disease largest lesion involving the sacrum with pathological fractures of T6 and T10 retroperitoneal and pelvic lymph node metastasis, right axillary lymph node, activity in the pancreatic head, hypermetabolic activity in the adrenal glands, right thyroid nodule. 2. biopsy of sacrum: 12/26/2021: Metastatic breast cancer, ER 90%, PR 10%, HER2 negative (0) 3.  Ibrance along with Faslodex started 12/25/2021-10/14/2022 4.  Xgeva for bone metastases.  Every 3 months 5.  Palliative radiation to the sacrum completed 01/19/2022 ---------------------------------------------------------------------------------------------------------------------- Current treatment: Enhertu started 10/21/2022, today is cycle 9 We plan to continue this treatment irrespective of her blood counts.   Enhertu toxicities: Tolerated extremely well without any problems. Completed radiation   Liver function tests have started to improve.   PET/CT 10/29/2022: Overall improved appearance of bone lesions.  Stable  right extra lymph node, retroperitoneal lymph nodes decreased activity CT head ordered by Dr. 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  Unchanged innumerable scalp nodules CT CAP 02/24/2023: Liver lesions diminished in size sclerosis of widespread bone metastases   Overall this is excellent response to treatment.   Vocal Cord Dysfunction: ENT consult and underwent speech therapy with improvement  noted. CT scans have been ordered for first week of March.  Return to clinic every 3 weeks for Enhertu treatment.    Orders Placed This Encounter  Procedures   CT CHEST ABDOMEN PELVIS W CONTRAST    No oral contrast    Standing Status:   Future    Expected Date:   05/19/2023    Expiration Date:   04/12/2024    If indicated for the ordered procedure, I authorize the administration of contrast media per Radiology protocol:   Yes    Does the patient have a contrast media/X-ray dye allergy?:   No    Preferred imaging location?:   [facility]    Release to patient:   Immediate    If indicated for the ordered procedure, I authorize the administration of oral contrast media per Radiology protocol:   Yes   The patient has a good understanding of the overall plan. she agrees with it. she will call with any problems that may develop before the next visit here. Total time spent: 30 mins including face to face time and time spent for planning, charting and co-ordination of care   Tamsen Meek, MD 04/13/23

## 2023-04-13 NOTE — Assessment & Plan Note (Signed)
10/26/17: Left mastectomy: IDC grade 1, 2 foci largest spans 8.5 cm, intermediate grade DCIS, lymphovascular invasion identified, perineural invasion identified, 1/2 lymph nodes positive with extracapsular extension, ER 9200%, PR 5 to 50%, HER-2 negative, Ki-67 10 to 15%, T3N1A   Oncotype DX score 22, intermediate risk, chemotherapy not felt to have significant benefit.   Treatment Summary: 1. Antiestrogen therapy with anastrozole 1 mg daily started 07/15/2016 2. Mastectomy 10/26/2017, Mammaprint low risk luminal type A 3. Followed by adjuvant radiation 12/08/17- 01/26/18  4. Followed by adjuvant antiestrogen therapy anastrozole started 01/17/2018 (originally started 07/15/2016) -------------------------------------------------------------------- Low back pain August 2023: Underwent CT myelogram: Large expansile lesion in the sacrum with extraosseous extension of the tumor, diffuse lytic lesions throughout the visualized spine with metastatic disease myeloma is considered less likely.  (This was ordered by Dr. Marcene Corning)   Treatment plan: 1.  PET CT scan 12/27/2021: Widespread bone metastatic disease largest lesion involving the sacrum with pathological fractures of T6 and T10 retroperitoneal and pelvic lymph node metastasis, right axillary lymph node, activity in the pancreatic head, hypermetabolic activity in the adrenal glands, right thyroid nodule. 2. biopsy of sacrum: 12/26/2021: Metastatic breast cancer, ER 90%, PR 10%, HER2 negative (0) 3.  Ibrance along with Faslodex started 12/25/2021-10/14/2022 4.  Xgeva for bone metastases.  Every 3 months 5.  Palliative radiation to the sacrum completed 01/19/2022 ---------------------------------------------------------------------------------------------------------------------- Current treatment: Enhertu started 10/21/2022, today is cycle 8 We plan to continue this treatment irrespective of her blood counts.   Enhertu toxicities: Tolerated extremely  well without any problems. Completed radiation   Liver function tests have started to improve.   PET/CT 10/29/2022: Overall improved appearance of bone lesions.  Stable right extra lymph node, retroperitoneal lymph nodes decreased activity CT head ordered by Dr. 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  Unchanged innumerable scalp nodules CT CAP 02/24/2023: Liver lesions diminished in size sclerosis of widespread bone metastases   Overall this is excellent response to treatment.   Vocal Cord Dysfunction: ENT consult Return to clinic every 3 weeks for Enhertu treatment.

## 2023-04-14 ENCOUNTER — Encounter: Payer: Medicaid Other | Admitting: Physical Therapy

## 2023-04-14 ENCOUNTER — Other Ambulatory Visit: Payer: Self-pay | Admitting: *Deleted

## 2023-04-14 ENCOUNTER — Encounter: Payer: Self-pay | Admitting: Hematology and Oncology

## 2023-04-14 ENCOUNTER — Encounter: Payer: Self-pay | Admitting: *Deleted

## 2023-04-14 DIAGNOSIS — C50512 Malignant neoplasm of lower-outer quadrant of left female breast: Secondary | ICD-10-CM

## 2023-04-14 DIAGNOSIS — C7951 Secondary malignant neoplasm of bone: Secondary | ICD-10-CM

## 2023-04-14 NOTE — Progress Notes (Signed)
Received mychart message from pt requesting port a cath be placed.  Verbal orders received by MD and placed.  Appt scheduled, pt notified and verbalized understanding.

## 2023-04-14 NOTE — Progress Notes (Signed)
RN sent in-basket to scheduling team to change all future lab appts to port flush with lab considering pt will have port placed on 04/27/23.

## 2023-04-15 ENCOUNTER — Encounter: Payer: Self-pay | Admitting: Hematology and Oncology

## 2023-04-19 ENCOUNTER — Ambulatory Visit: Payer: Medicaid Other | Attending: Student

## 2023-04-19 DIAGNOSIS — M6281 Muscle weakness (generalized): Secondary | ICD-10-CM | POA: Insufficient documentation

## 2023-04-19 DIAGNOSIS — R490 Dysphonia: Secondary | ICD-10-CM | POA: Insufficient documentation

## 2023-04-19 NOTE — Therapy (Signed)
OUTPATIENT SPEECH LANGUAGE PATHOLOGY  VOICE TREATMENT   Patient Name: Kathryn Lucas MRN: [id number] DOB:05-15-1960, 63 y.o., female Today's Date: 04/19/2023  PCP: Jacqulyn Bath, MD  REFERRING PROVIDER: Bonney Roussel, [name]   End of Session - 04/19/23 1404     Visit Number 5    Number of Visits 24    Date for SLP Re-Evaluation 06/23/23    Authorization Type carelon order#[id number]    Authorization Time Period 1/20-3/20 for 8 SLP visits    Authorization - Visit Number 4    Authorization - Number of Visits 8    Progress Note Due on Visit 10    SLP Start Time 1404    SLP Stop Time  1445    SLP Time Calculation (min) 41 min    Activity Tolerance Patient tolerated treatment well             Past Medical History:  Diagnosis Date   Breast cancer (HCC)    left breast cancer   Cancer (HCC) 09/2017   left breast cancer   Family history of breast cancer    Hypothyroidism    Personal history of radiation therapy 2019   Thyroid disease    Past Surgical History:  Procedure Laterality Date   BREAST BIOPSY Left 2018   BREAST BIOPSY Left 2019   BREAST RECONSTRUCTION WITH PLACEMENT OF TISSUE EXPANDER AND ALLODERM Left 10/26/2017   Procedure: LEFT BREAST RECONSTRUCTION WITH PLACEMENT OF TISSUE EXPANDER AND ALLODERM;  Surgeon: Glenna Fellows, MD;  Location: [facility];  Service: Plastics;  Laterality: Left;   MASTECTOMY Left 2019   MASTECTOMY WITH RADIOACTIVE SEED GUIDED EXCISION AND AXILLARY SENTINEL LYMPH NODE BIOPSY Left 10/26/2017   Procedure: LEFT MASTECTOMY WITH SEED TARGETED  LEFT AXILLARY LYMPH NODE EXCISION AND LEFT SENTINEL LYMPH NODE BIOPSY;  Surgeon: Almond Lint, MD;  Location: [facility];  Service: General;  Laterality: Left;   TONSILLECTOMY     WISDOM TOOTH EXTRACTION     Patient Active Problem List   Diagnosis Date Noted   Metastatic 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) 10/14/2022   Elevated liver enzymes 10/14/2022   Unexplained weight loss 10/14/2022    Neoplasm related pain 10/14/2022   Metastatic malignant neoplasm (HCC) 12/19/2021   Family history of breast cancer    History of therapeutic radiation 04/27/2018   Breast cancer, left breast (HCC) 10/26/2017   Malignant neoplasm of lower-outer quadrant of left breast of female, estrogen receptor positive (HCC) 07/15/2016   Plantar fasciitis 07/30/2015   Metatarsalgia 10/18/2014    ONSET DATE:  03/29/23 (referral); since July 2024 per pt  REFERRING DIAG: Hoarseness  THERAPY DIAG:  Dysphonia  Muscle weakness (generalized)  Rationale for Evaluation and Treatment Rehabilitation  SUBJECTIVE:   SUBJECTIVE STATEMENT: Pt alert, pleasant, and cooperative. Dysphonia apparent. Pt accompanied by: self  PERTINENT HISTORY: 63 y.o. female who presents for voice evaluation. 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 On endoscopy, 1/13, pt with weakness and bowing of B vocal cords with more weakness on L than R. Pt referred for course ST prior to consideration laryngologist referral per ENT note.  DIAGNOSTIC FINDINGS: 02/18/23, CT Chest Abdomen Pelvis, "1. Hypodense lesion of the medial liver dome, in retrospective review diminished in size compared to prior PET-CT examination. Findings are consistent with treatment response of hepatic metastatic disease. 2. Slightly increased sclerosis of widespread mixed lytic and sclerotic osseous metastatic disease throughout the axial skeleton, consistent with treatment response. 3.  Unchanged post treatment/post radiation appearance of the right chest, with perihilar fibrosis and consolidation. 4. Status post left mastectomy with saline spacer."  PAIN:  Are you having pain? No (FACES)   FALLS: defer to PT  LIVING ENVIRONMENT: Lives with: lives alone   PLOF: Independent  PATIENT GOALS    for voice to be stronger  OBJECTIVE:  TODAY'S TREATMENT: Introduced Passenger transport manager ex's. Pt  completed as follows with use of biofeedback and min/mod verbal cues: Sustained "ah" x10, ~5-6s, 82dB Ascending pitch glides x10, ~4s, 83dB Descending pitch glides x10, ~5s, 82dB Functional phrases: Pt read aloud twice in both a higher pitch, loud voice and a lower pitch, authoritative voice averaging 84dB.  Conversation level training - pt averaged 76dB during 10 minutes of conversation.       PATIENT EDUCATION: Education details: HEP, POC Person educated: Patient Education method: Explanation; Handout Education comprehension: verbalized understanding   HOME EXERCISE PROGRAM: Phonation Resistance Ex's     GOALS: Goals reviewed with patient? Yes  SHORT TERM GOALS: Target date: 10 sessions  Pt will improve water intake to 5-8 glasses per day per pt report. Baseline: Goal status: INITIAL  2.  The patient will demonstrate abdominal breathing patterns and steady release of breath on exhalation to optimize efficiency of voicing and decrease laryngeal hyperfunction with min cueing.  Baseline:  Goal status: INITIAL  3.  The patient will eliminate phonotraumatic behaviors such as chronic throat clearing, by substituting non-traumatic methods to clear mucus with min cueing.  Baseline:  Goal status: INITIAL  4.  The patient will utilize a forward tone focused/resonant voice to decrease vocal hyperfunction and improve voice quality and vocal projection with min cueing. Baseline:  Goal status: INITIAL  5.  Pt will perform HEP for voice with min cueing. Baseline:  Goal status: INITIAL  6.  The patient will participate in 5-8 minutes conversation, maintaining average loudness of 75 dB and loud, good quality voice with min cues.     Baseline:  Goal status: INITIAL  LONG TERM GOALS: Target date: 12 weeks  Pt will improve score on the VHI by 10% indicating improved perception of speech abilities. Baseline: 42/120 Goal status: INITIAL  2.  The patient will demonstrate  abdominal breathing patterns and steady release of breath on exhalation to optimize efficiency of voicing and decrease laryngeal hyperfunction.  Baseline:  Goal status: INITIAL    3.  Pt will perform HEP for voice independently. Baseline:  Goal status: INITIAL  4.  The patient will participate in 15-20 minutes conversation, maintaining average loudness of 75 dB and loud, good quality voice with min cues.     Baseline:  Goal status: INITIAL  ASSESSMENT:  CLINICAL IMPRESSION: Patient is a 63 y.o. female who presents for voice treatment. 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 On endoscopy, 03/29/23, pt with weakness and bowing of B vocal cords with more weakness on L than R. Pt referred for course ST prior to consideration laryngologist referral per ENT note. Pt's voice with perceptual characteristics of breathiness, hoarseness, volume decay, and diplophonia. Pt with tendency to utilize clavicular breathing. Pt with stimulable to resonant voice therapy and clear speech therapy. Pt reports dissatisfactoin with voice particularly in noisy environments or instances when she needs to project (e.g. bible study). See details of today's treatment above. Recommend course of speech therapy to improve pt's voice in social settings and for ADLs/iADLs.  OBJECTIVE IMPAIRMENTS include voice disorder.  These impairments are limiting patient from effectively communicating at home and in community. Factors affecting potential to achieve goals and functional outcome are  overall medical status/comorbidities . Patient will benefit from skilled SLP services to address above impairments and improve overall function.  REHAB POTENTIAL: Good  PLAN: SLP FREQUENCY: 1-2x/week  SLP DURATION: 12 weeks  PLANNED INTERVENTIONS: Cueing hierachy, Internal/external aids, Functional tasks, Multimodal communication approach, SLP instruction and feedback, Compensatory  strategies, and Patient/family education    Clyde Canterbury, M.S., CCC-SLP Speech-Language Pathologist [facility] [phone] Arnette Felts)  [facility] at [facility] [address] [place], [place], [address] Phone: [phone]   Fax:  [phone]

## 2023-04-20 ENCOUNTER — Other Ambulatory Visit: Payer: Medicaid Other

## 2023-04-20 ENCOUNTER — Ambulatory Visit: Payer: Medicaid Other | Admitting: Hematology and Oncology

## 2023-04-20 ENCOUNTER — Ambulatory Visit: Payer: Medicaid Other

## 2023-04-21 ENCOUNTER — Other Ambulatory Visit: Payer: Self-pay | Admitting: *Deleted

## 2023-04-21 ENCOUNTER — Inpatient Hospital Stay: Payer: Medicaid Other | Attending: Hematology and Oncology

## 2023-04-21 ENCOUNTER — Encounter: Payer: Self-pay | Admitting: Hematology and Oncology

## 2023-04-21 ENCOUNTER — Ambulatory Visit: Payer: Medicaid Other | Admitting: Physical Therapy

## 2023-04-21 DIAGNOSIS — Z9012 Acquired absence of left breast and nipple: Secondary | ICD-10-CM | POA: Diagnosis not present

## 2023-04-21 DIAGNOSIS — Z170 Estrogen receptor positive status [ER+]: Secondary | ICD-10-CM

## 2023-04-21 DIAGNOSIS — Z923 Personal history of irradiation: Secondary | ICD-10-CM | POA: Insufficient documentation

## 2023-04-21 DIAGNOSIS — C7951 Secondary malignant neoplasm of bone: Secondary | ICD-10-CM | POA: Diagnosis present

## 2023-04-21 DIAGNOSIS — Z5112 Encounter for antineoplastic immunotherapy: Secondary | ICD-10-CM | POA: Diagnosis present

## 2023-04-21 DIAGNOSIS — Z79811 Long term (current) use of aromatase inhibitors: Secondary | ICD-10-CM | POA: Diagnosis not present

## 2023-04-21 DIAGNOSIS — Z9221 Personal history of antineoplastic chemotherapy: Secondary | ICD-10-CM | POA: Insufficient documentation

## 2023-04-21 DIAGNOSIS — C50512 Malignant neoplasm of lower-outer quadrant of left female breast: Secondary | ICD-10-CM

## 2023-04-21 DIAGNOSIS — Z79899 Other long term (current) drug therapy: Secondary | ICD-10-CM | POA: Insufficient documentation

## 2023-04-21 LAB — URINALYSIS, COMPLETE (UACMP) WITH MICROSCOPIC
Bacteria, UA: NONE SEEN
Bilirubin Urine: NEGATIVE
Glucose, UA: NEGATIVE mg/dL
Hgb urine dipstick: NEGATIVE
Ketones, ur: NEGATIVE mg/dL
Leukocytes,Ua: NEGATIVE
Nitrite: NEGATIVE
Protein, ur: NEGATIVE mg/dL
Specific Gravity, Urine: 1.015 (ref 1.005–1.030)
pH: 5 (ref 5.0–8.0)

## 2023-04-21 NOTE — Progress Notes (Signed)
Received message from pt with complaint of ammonia smell with urination.  Pt denies urine frequency or pain at this time but is requesting UA to test for UTI.  RN reviewed with MD and verbal orders received and placed.

## 2023-04-22 LAB — URINE CULTURE

## 2023-04-23 ENCOUNTER — Encounter: Payer: Self-pay | Admitting: Hematology and Oncology

## 2023-04-25 ENCOUNTER — Other Ambulatory Visit: Payer: Self-pay | Admitting: Radiology

## 2023-04-26 ENCOUNTER — Encounter: Payer: Self-pay | Admitting: Hematology and Oncology

## 2023-04-26 NOTE — H&P (Signed)
Chief Complaint: Metastatic left breast cancer , poor venous access; referred for port a cath placement to assist with treatment  Referring Provider(s): Gudena,V  Supervising Physician: Roanna Banning  Patient Status: [facility] - Out-pt  History of Present Illness: Kathryn Lucas is a 63 y.o. female with PMH sig for hypothyroidism, vocal cord dysfunction and left breast cancer 2018 s/p mastectomy/chemoradiation with metastatic disease noted since 2023. She presents today for port a cath placement to assist with additional treatment.    Patient is Full Code  Past Medical History:  Diagnosis Date   Breast cancer (HCC)    left breast cancer   Cancer (HCC) 09/2017   left breast cancer   Family history of breast cancer    Hypothyroidism    Personal history of radiation therapy 2019   Thyroid disease     Past Surgical History:  Procedure Laterality Date   BREAST BIOPSY Left 2018   BREAST BIOPSY Left 2019   BREAST RECONSTRUCTION WITH PLACEMENT OF TISSUE EXPANDER AND ALLODERM Left 10/26/2017   Procedure: LEFT BREAST RECONSTRUCTION WITH PLACEMENT OF TISSUE EXPANDER AND ALLODERM;  Surgeon: Glenna Fellows, MD;  Location: [facility];  Service: Plastics;  Laterality: Left;   MASTECTOMY Left 2019   MASTECTOMY WITH RADIOACTIVE SEED GUIDED EXCISION AND AXILLARY SENTINEL LYMPH NODE BIOPSY Left 10/26/2017   Procedure: LEFT MASTECTOMY WITH SEED TARGETED  LEFT AXILLARY LYMPH NODE EXCISION AND LEFT SENTINEL LYMPH NODE BIOPSY;  Surgeon: Almond Lint, MD;  Location: [facility];  Service: General;  Laterality: Left;   TONSILLECTOMY     WISDOM TOOTH EXTRACTION      Allergies: Percocet [oxycodone-acetaminophen]  Medications: Prior to Admission medications   Medication Sig Start Date End Date Taking? Authorizing Provider  azelastine (ASTELIN) 0.1 % nasal spray Place into both nostrils 2 (two) times daily. Patient not taking: Reported on 11/03/2022 08/31/22   [provider]  Calcium 500-100 MG-UNIT CHEW Chew 1 tablet by mouth daily. 12/08/19   [name], MD  cetirizine (ZYRTEC) 10 MG tablet Take by mouth. Patient not taking: Reported on 11/03/2022    [provider]  cholecalciferol (VITAMIN D3) 25 MCG (1000 UNIT) tablet Take 1 tablet (1,000 Units total) by mouth daily. Patient not taking: Reported on 11/03/2022 12/08/19   [name], MD  dexamethasone (DECADRON) 4 MG tablet Take 1 tab for 2 days after chemo Patient not taking: Reported on 12/28/2022 10/21/22   [name], MD  furosemide (LASIX) 20 MG tablet Take 1 tablet (20 mg total) by mouth daily. If needed may take 1/2 tab q am 12/04/22   [name], MD  levothyroxine (SYNTHROID) 112 MCG tablet Take 1 tablet by mouth daily.    [provider]  LORazepam (ATIVAN) 0.5 MG tablet Take 1 tablet (0.5 mg total) by mouth at bedtime. Patient not taking: Reported on 11/03/2022 04/02/22   Pickenpack-Cousar, Arty Baumgartner, NP  omeprazole (PRILOSEC) 20 MG capsule Take 1 capsule (20 mg total) by mouth daily. 01/14/23   [name], MD  ondansetron (ZOFRAN) 8 MG tablet Take 1 tablet (8 mg total) by mouth every 8 (eight) hours as needed for nausea or vomiting. Start on the third day after chemotherapy. Patient not taking: Reported on 11/03/2022 10/21/22   [name], MD  pantoprazole (PROTONIX) 40 MG tablet 1 tablet Orally Once a day for 30 days Take 1 tablet twice daily for 2 weeks then take 1 tablet daily. Patient not taking: Reported on 12/28/2022 09/02/22  [provider]  prochlorperazine (COMPAZINE) 10 MG tablet Take 1 tablet (10 mg total) by mouth every 6 (six) hours as needed for nausea or vomiting. Patient not taking: Reported on 11/03/2022 10/21/22   [name], MD  sucralfate (CARAFATE) 1 g tablet TAKE 1 TABLET BY MOUTH 4 TIMES DAILY WITH MEALS AND AT BEDTIME. DISSOLVE IN WARM WATER, SWISH AND SWALLOW. 01/05/23   Carmina Miller, MD  vitamin C (ASCORBIC ACID) 250 MG tablet  Take 1 tablet (250 mg total) by mouth daily. 12/08/19   [name], MD     Family History  Problem Relation Age of Onset   Breast cancer Mother 86   Stroke Sister        thought to be due to tamoxifen use   Dementia Maternal Grandmother    COPD Paternal Grandfather     Social History   Socioeconomic History   Marital status: Single    Spouse name: Not on file   Number of children: Not on file   Years of education: Not on file   Highest education level: Not on file  Occupational History   Not on file  Tobacco Use   Smoking status: Never   Smokeless tobacco: Never  Vaping Use   Vaping status: Never Used  Substance and Sexual Activity   Alcohol use: Yes    Alcohol/week: 0.0 standard drinks of alcohol    Comment: social   Drug use: Never   Sexual activity: Not on file  Other Topics Concern   Not on file  Social History Narrative   Lives alone and one dog.   Social Drivers of Corporate investment banker Strain: Not on file  Food Insecurity: No Food Insecurity (09/14/2022)   Hunger Vital Sign    Worried About Running Out of Food in the Last Year: Never true    Ran Out of Food in the Last Year: Never true  Transportation Needs: No Transportation Needs (09/14/2022)   PRAPARE - Administrator, Civil Service (Medical): No    Lack of Transportation (Non-Medical): No  Physical Activity: Not on file  Stress: Not on file  Social Connections: Not on file       Review of Systems: denies fever,HA,CP, cough, abd/back pain, vomiting or bleeding; she does have some mild dyspnea with exertion, shoulder pain, occ nausea; she is anxious  Vital Signs: Vitals:   04/27/23 1155  BP: 116/79  Pulse: (!) 117  Resp: 16  Temp: 97.9 F (36.6 C)  SpO2: 99%     Advance Care Plan: The advanced care place/surrogate decision maker was discussed at the time of visit and the patient did not wish to discuss or was not able to name a surrogate decision maker or provide an  advance care plan.  Physical Exam: awake/alert; chest- CTA bilat; heart- tachy but reg rhythm; abd-soft,+BS,NT; ext with FROM  Imaging: No results found.  Labs:  CBC: Recent Labs    02/04/23 1317 03/02/23 1325 03/23/23 1307 04/13/23 1345  WBC 3.3* 3.9* 3.6* 3.7*  HGB 13.1 13.6 12.3 12.4  HCT 40.6 40.2 37.5 38.3  PLT 293 259 307 236    COAGS: No results for input(s): "INR", "APTT" in the last 8760 hours.  BMP: Recent Labs    02/04/23 1317 03/02/23 1325 03/23/23 1307 04/13/23 1345  NA 138 138 137 139  K 3.9 4.0 4.2 4.2  CL 106 106 108 108  CO2 26 25 23 26   GLUCOSE 93 94 111* 75  BUN 11 7* 14 14  CALCIUM 9.7 9.5 8.7* 9.6  CREATININE 0.56 0.57 0.58 0.65  GFRNONAA >60 >60 >60 >60    LIVER FUNCTION TESTS: Recent Labs    02/04/23 1317 03/02/23 1325 03/23/23 1307 04/13/23 1345  BILITOT 0.3 0.4 0.3 0.4  AST 21 34 26 32  ALT 9 12 10 10   ALKPHOS 125 126 95 95  PROT 7.0 6.8 6.4* 6.3*  ALBUMIN 3.9 4.0 3.9 3.7    TUMOR MARKERS: No results for input(s): "AFPTM", "CEA", "CA199", "CHROMGRNA" in the last 8760 hours.  Assessment and Plan: 63 y.o. female with PMH sig for hypothyroidism, vocal cord dysfunction and left breast cancer 2018 s/p mastectomy/chemoradiation with metastatic disease noted since 2023. She is known to IR team from sacral bone lesion bx in 2023.  She presents today for port a cath placement to assist with additional treatment. Risks and benefits of image guided port-a-catheter placement was discussed with the patient including, but not limited to bleeding, infection, pneumothorax, or fibrin sheath development and need for additional procedures.  All of the patient's questions were answered, patient is agreeable to proceed. Consent signed and in chart.    Thank you for allowing our service to participate in Kathryn Lucas 's care.  Electronically Signed: D. Jeananne Rama, PA-C   04/26/2023, 4:02 PM      I spent a total of    15 Minutes in  face to face in clinical consultation, greater than 50% of which was counseling/coordinating care for port a cath placement

## 2023-04-27 ENCOUNTER — Ambulatory Visit (HOSPITAL_COMMUNITY)
Admission: RE | Admit: 2023-04-27 | Discharge: 2023-04-27 | Disposition: A | Discharge status: Home / Self Care | Payer: Medicaid Other | Source: Ambulatory Visit | Attending: Hematology and Oncology

## 2023-04-27 ENCOUNTER — Ambulatory Visit (HOSPITAL_COMMUNITY)
Admission: RE | Admit: 2023-04-27 | Discharge: 2023-04-27 | Disposition: A | Discharge status: Home / Self Care | Payer: Medicaid Other | Source: Ambulatory Visit | Attending: Hematology and Oncology | Admitting: Hematology and Oncology

## 2023-04-27 ENCOUNTER — Other Ambulatory Visit: Payer: Self-pay

## 2023-04-27 ENCOUNTER — Other Ambulatory Visit: Payer: Self-pay | Admitting: Hematology and Oncology

## 2023-04-27 ENCOUNTER — Encounter (HOSPITAL_COMMUNITY): Payer: Self-pay

## 2023-04-27 DIAGNOSIS — Z9012 Acquired absence of left breast and nipple: Secondary | ICD-10-CM | POA: Insufficient documentation

## 2023-04-27 DIAGNOSIS — C7951 Secondary malignant neoplasm of bone: Secondary | ICD-10-CM | POA: Insufficient documentation

## 2023-04-27 DIAGNOSIS — C50512 Malignant neoplasm of lower-outer quadrant of left female breast: Secondary | ICD-10-CM | POA: Insufficient documentation

## 2023-04-27 DIAGNOSIS — Z170 Estrogen receptor positive status [ER+]: Secondary | ICD-10-CM

## 2023-04-27 DIAGNOSIS — Z853 Personal history of malignant neoplasm of breast: Secondary | ICD-10-CM | POA: Insufficient documentation

## 2023-04-27 HISTORY — PX: IR IMAGING GUIDED PORT INSERTION: IMG5740

## 2023-04-27 MED ORDER — DIPHENHYDRAMINE HCL 50 MG/ML IJ SOLN
INTRAMUSCULAR | Status: AC
  Filled 2023-04-27: qty 1

## 2023-04-27 MED ORDER — LIDOCAINE-EPINEPHRINE 1 %-1:100000 IJ SOLN
20.0000 mL | Freq: Once | INTRAMUSCULAR | Status: AC
  Administered 2023-04-27: 20 mL via INTRADERMAL

## 2023-04-27 MED ORDER — LIDOCAINE-EPINEPHRINE 1 %-1:100000 IJ SOLN
INTRAMUSCULAR | Status: AC
  Filled 2023-04-27: qty 1

## 2023-04-27 MED ORDER — FENTANYL CITRATE (PF) 100 MCG/2ML IJ SOLN
INTRAMUSCULAR | Status: AC | PRN
  Administered 2023-04-27 (×2): 50 ug via INTRAVENOUS

## 2023-04-27 MED ORDER — MIDAZOLAM HCL 2 MG/2ML IJ SOLN
INTRAMUSCULAR | Status: AC
  Filled 2023-04-27: qty 2

## 2023-04-27 MED ORDER — SODIUM CHLORIDE 0.9 % IV SOLN: INTRAVENOUS | Status: DC

## 2023-04-27 MED ORDER — FENTANYL CITRATE (PF) 100 MCG/2ML IJ SOLN
INTRAMUSCULAR | Status: AC
  Filled 2023-04-27: qty 2

## 2023-04-27 MED ORDER — DIPHENHYDRAMINE HCL 50 MG/ML IJ SOLN
INTRAMUSCULAR | Status: AC | PRN
  Administered 2023-04-27: 25 mg via INTRAVENOUS

## 2023-04-27 MED ORDER — HEPARIN SOD (PORK) LOCK FLUSH 100 UNIT/ML IV SOLN
500.0000 [IU] | Freq: Once | INTRAVENOUS | Status: AC
  Administered 2023-04-27: 500 [IU] via INTRAVENOUS

## 2023-04-27 MED ORDER — HEPARIN SOD (PORK) LOCK FLUSH 100 UNIT/ML IV SOLN
INTRAVENOUS | Status: AC
  Filled 2023-04-27: qty 5

## 2023-04-27 MED ORDER — MIDAZOLAM HCL 2 MG/2ML IJ SOLN
INTRAMUSCULAR | Status: AC | PRN
  Administered 2023-04-27 (×2): 1 mg via INTRAVENOUS

## 2023-04-27 NOTE — Progress Notes (Signed)
Correct time 1535

## 2023-04-27 NOTE — Procedures (Signed)
Vascular and Interventional Radiology Procedure Note  Patient: Kathryn Lucas DOB: 01-28-1961 Medical Record Number: [id number] Note Date/Time: 04/27/23 2:34 PM   Performing Physician: Roanna Banning, MD Assistant(s): None  Diagnosis: Breast cancer  Procedure: PORT PLACEMENT  Anesthesia: Local Anesthetic Complications: None Estimated Blood Loss: Minimal  Findings:  Successful right-sided port placement, with the tip of the catheter in the proximal right atrium.  Plan: Catheter ready for use.  See detailed procedure note with images in PACS. The patient tolerated the procedure well without incident or complication and was returned to Recovery in stable condition.    Roanna Banning, MD [facility]   Pager. [phone] Clinic. [phone]

## 2023-04-27 NOTE — Discharge Instructions (Signed)

## 2023-04-28 ENCOUNTER — Ambulatory Visit: Payer: Medicaid Other

## 2023-04-28 DIAGNOSIS — R490 Dysphonia: Secondary | ICD-10-CM

## 2023-04-28 NOTE — Therapy (Signed)
OUTPATIENT SPEECH LANGUAGE PATHOLOGY  VOICE TREATMENT   Patient Name: Kathryn Lucas MRN: [id number] DOB:27-Oct-1960, 63 y.o., female Today's Date: 04/28/2023  PCP: Jacqulyn Bath, MD  REFERRING PROVIDER: Bonney Roussel, [name]   End of Session - 04/28/23 1320     Visit Number 6    Number of Visits 24    Date for SLP Re-Evaluation 06/23/23    Authorization Type carelon order#[id number]    Authorization Time Period 1/20-3/20 for 8 SLP visits    Authorization - Visit Number 5    Authorization - Number of Visits 8    Progress Note Due on Visit 10    SLP Start Time 1320    SLP Stop Time  1400    SLP Time Calculation (min) 40 min             Past Medical History:  Diagnosis Date   Breast cancer (HCC)    left breast cancer   Cancer (HCC) 09/2017   left breast cancer   Family history of breast cancer    Hypothyroidism    Personal history of radiation therapy 2019   Thyroid disease    Past Surgical History:  Procedure Laterality Date   BREAST BIOPSY Left 2018   BREAST BIOPSY Left 2019   BREAST RECONSTRUCTION WITH PLACEMENT OF TISSUE EXPANDER AND ALLODERM Left 10/26/2017   Procedure: LEFT BREAST RECONSTRUCTION WITH PLACEMENT OF TISSUE EXPANDER AND ALLODERM;  Surgeon: Glenna Fellows, MD;  Location: [facility];  Service: Plastics;  Laterality: Left;   IR IMAGING GUIDED PORT INSERTION  04/27/2023   MASTECTOMY Left 2019   MASTECTOMY WITH RADIOACTIVE SEED GUIDED EXCISION AND AXILLARY SENTINEL LYMPH NODE BIOPSY Left 10/26/2017   Procedure: LEFT MASTECTOMY WITH SEED TARGETED  LEFT AXILLARY LYMPH NODE EXCISION AND LEFT SENTINEL LYMPH NODE BIOPSY;  Surgeon: Almond Lint, MD;  Location: [facility];  Service: General;  Laterality: Left;   TONSILLECTOMY     WISDOM TOOTH EXTRACTION     Patient Active Problem List   Diagnosis Date Noted   Metastatic 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) 10/14/2022   Elevated liver enzymes 10/14/2022   Unexplained weight loss 10/14/2022   Neoplasm  related pain 10/14/2022   Metastatic malignant neoplasm (HCC) 12/19/2021   Family history of breast cancer    History of therapeutic radiation 04/27/2018   Breast cancer, left breast (HCC) 10/26/2017   Malignant neoplasm of lower-outer quadrant of left breast of female, estrogen receptor positive (HCC) 07/15/2016   Plantar fasciitis 07/30/2015   Metatarsalgia 10/18/2014    ONSET DATE:  03/29/23 (referral); since July 2024 per pt  REFERRING DIAG: Hoarseness  THERAPY DIAG:  Dysphonia  Rationale for Evaluation and Treatment Rehabilitation  SUBJECTIVE:   SUBJECTIVE STATEMENT: Pt alert, pleasant, and cooperative. Dysphonia apparent. Pt accompanied by: self  PERTINENT HISTORY: 63 y.o. female who presents for voice evaluation. 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 On endoscopy, 1/13, pt with weakness and bowing of B vocal cords with more weakness on L than R. Pt referred for course ST prior to consideration laryngologist referral per ENT note.  DIAGNOSTIC FINDINGS: 02/18/23, CT Chest Abdomen Pelvis, "1. Hypodense lesion of the medial liver dome, in retrospective review diminished in size compared to prior PET-CT examination. Findings are consistent with treatment response of hepatic metastatic disease. 2. Slightly increased sclerosis of widespread mixed lytic and sclerotic osseous metastatic disease throughout the axial skeleton, consistent with treatment response. 3. Unchanged post treatment/post radiation  appearance of the right chest, with perihilar fibrosis and consolidation. 4. Status post left mastectomy with saline spacer."  PAIN:  Are you having pain? No (FACES)   FALLS: defer to PT  LIVING ENVIRONMENT: Lives with: lives alone   PLOF: Independent  PATIENT GOALS    for voice to be stronger  OBJECTIVE:  TODAY'S TREATMENT: Introduced Passenger transport manager ex's. Pt completed as follows with use of  biofeedback and min/mod verbal cues: Sustained "ah" x10, ~5-6s, 85dB Ascending pitch glides x10, 85dB Descending pitch glides x10, 82dB Functional phrases: Pt read aloud twice in both a higher pitch, loud voice and a lower pitch, authoritative voice averaging 84dB.  Conversation level training - pt averaged 76dB during 10 minutes of conversation.   Introduced myofascial release for laryngeal, neck, jaw, and base of tongue. Pt completed with min/mod visual cues initial, improving to rare cueing. Pt provided with handout of massages/stretches for HEP.    PATIENT EDUCATION: Education details: HEP, POC Person educated: Patient Education method: Explanation; Handout Education comprehension: verbalized understanding   HOME EXERCISE PROGRAM: Phonation Resistance Ex's Myofascial release     GOALS: Goals reviewed with patient? Yes  SHORT TERM GOALS: Target date: 10 sessions  Pt will improve water intake to 5-8 glasses per day per pt report. Baseline: Goal status: INITIAL  2.  The patient will demonstrate abdominal breathing patterns and steady release of breath on exhalation to optimize efficiency of voicing and decrease laryngeal hyperfunction with min cueing.  Baseline:  Goal status: INITIAL  3.  The patient will eliminate phonotraumatic behaviors such as chronic throat clearing, by substituting non-traumatic methods to clear mucus with min cueing.  Baseline:  Goal status: INITIAL  4.  The patient will utilize a forward tone focused/resonant voice to decrease vocal hyperfunction and improve voice quality and vocal projection with min cueing. Baseline:  Goal status: INITIAL  5.  Pt will perform HEP for voice with min cueing. Baseline:  Goal status: INITIAL  6.  The patient will participate in 5-8 minutes conversation, maintaining average loudness of 75 dB and loud, good quality voice with min cues.     Baseline:  Goal status: INITIAL  LONG TERM GOALS: Target date: 12  weeks  Pt will improve score on the VHI by 10% indicating improved perception of speech abilities. Baseline: 42/120 Goal status: INITIAL  2.  The patient will demonstrate abdominal breathing patterns and steady release of breath on exhalation to optimize efficiency of voicing and decrease laryngeal hyperfunction.  Baseline:  Goal status: INITIAL    3.  Pt will perform HEP for voice independently. Baseline:  Goal status: INITIAL  4.  The patient will participate in 15-20 minutes conversation, maintaining average loudness of 75 dB and loud, good quality voice with min cues.     Baseline:  Goal status: INITIAL  ASSESSMENT:  CLINICAL IMPRESSION: Patient is a 63 y.o. female who presents for voice treatment. 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 On endoscopy, 03/29/23, pt with weakness and bowing of B vocal cords with more weakness on L than R. Pt referred for course ST prior to consideration laryngologist referral per ENT note. Pt's voice with perceptual characteristics of breathiness, hoarseness, volume decay, and diplophonia. Pt with tendency to utilize clavicular breathing. Pt with stimulable to resonant voice therapy and clear speech therapy. Pt reports dissatisfactoin with voice particularly in noisy environments or instances when she needs to project (e.g. bible study). See details  of today's treatment above. Recommend course of speech therapy to improve pt's voice in social settings and for ADLs/iADLs.  OBJECTIVE IMPAIRMENTS include voice disorder. These impairments are limiting patient from effectively communicating at home and in community. Factors affecting potential to achieve goals and functional outcome are  overall medical status/comorbidities . Patient will benefit from skilled SLP services to address above impairments and improve overall function.  REHAB POTENTIAL: Good  PLAN: SLP FREQUENCY: 1-2x/week  SLP  DURATION: 12 weeks  PLANNED INTERVENTIONS: Cueing hierachy, Internal/external aids, Functional tasks, Multimodal communication approach, SLP instruction and feedback, Compensatory strategies, and Patient/family education    Clyde Canterbury, M.S., CCC-SLP Speech-Language Pathologist [facility] [phone] Arnette Felts)  [facility] [address] [place], [place], [address] Phone: [phone]   Fax:  [phone]

## 2023-05-03 MED FILL — Fosaprepitant Dimeglumine For IV Infusion 150 MG (Base Eq): INTRAVENOUS | Qty: 5 | Status: AC

## 2023-05-04 ENCOUNTER — Telehealth: Payer: Self-pay | Admitting: *Deleted

## 2023-05-04 ENCOUNTER — Other Ambulatory Visit: Payer: Self-pay | Admitting: Hematology and Oncology

## 2023-05-04 ENCOUNTER — Inpatient Hospital Stay: Payer: Medicaid Other | Admitting: Adult Health

## 2023-05-04 ENCOUNTER — Other Ambulatory Visit: Payer: Medicaid Other

## 2023-05-04 ENCOUNTER — Inpatient Hospital Stay (HOSPITAL_BASED_OUTPATIENT_CLINIC_OR_DEPARTMENT_OTHER): Payer: Medicaid Other

## 2023-05-04 ENCOUNTER — Inpatient Hospital Stay: Payer: Medicaid Other

## 2023-05-04 ENCOUNTER — Encounter: Payer: Self-pay | Admitting: Hematology and Oncology

## 2023-05-04 ENCOUNTER — Other Ambulatory Visit: Payer: Self-pay

## 2023-05-04 VITALS — BP 112/75 | HR 110 | Resp 18

## 2023-05-04 VITALS — BP 127/74 | HR 129 | Temp 97.5°F | Resp 20 | Ht 70.0 in | Wt 155.1 lb

## 2023-05-04 DIAGNOSIS — C799 Secondary malignant neoplasm of unspecified site: Secondary | ICD-10-CM

## 2023-05-04 DIAGNOSIS — Z5112 Encounter for antineoplastic immunotherapy: Secondary | ICD-10-CM | POA: Diagnosis not present

## 2023-05-04 DIAGNOSIS — Z170 Estrogen receptor positive status [ER+]: Secondary | ICD-10-CM

## 2023-05-04 DIAGNOSIS — C50512 Malignant neoplasm of lower-outer quadrant of left female breast: Secondary | ICD-10-CM

## 2023-05-04 DIAGNOSIS — E039 Hypothyroidism, unspecified: Secondary | ICD-10-CM

## 2023-05-04 LAB — CBC WITH DIFFERENTIAL (CANCER CENTER ONLY)
Abs Immature Granulocytes: 0.04 10*3/uL (ref 0.00–0.07)
Basophils Absolute: 0 10*3/uL (ref 0.0–0.1)
Basophils Relative: 1 %
Eosinophils Absolute: 0.1 10*3/uL (ref 0.0–0.5)
Eosinophils Relative: 2 %
HCT: 37.6 % (ref 36.0–46.0)
Hemoglobin: 12.3 g/dL (ref 12.0–15.0)
Immature Granulocytes: 1 %
Lymphocytes Relative: 11 %
Lymphs Abs: 0.4 10*3/uL — ABNORMAL LOW (ref 0.7–4.0)
MCH: 33.6 pg (ref 26.0–34.0)
MCHC: 32.7 g/dL (ref 30.0–36.0)
MCV: 102.7 fL — ABNORMAL HIGH (ref 80.0–100.0)
Monocytes Absolute: 0.3 10*3/uL (ref 0.1–1.0)
Monocytes Relative: 10 %
Neutro Abs: 2.5 10*3/uL (ref 1.7–7.7)
Neutrophils Relative %: 75 %
Platelet Count: 256 10*3/uL (ref 150–400)
RBC: 3.66 MIL/uL — ABNORMAL LOW (ref 3.87–5.11)
RDW: 17.1 % — ABNORMAL HIGH (ref 11.5–15.5)
WBC Count: 3.3 10*3/uL — ABNORMAL LOW (ref 4.0–10.5)
nRBC: 0 % (ref 0.0–0.2)

## 2023-05-04 LAB — CMP (CANCER CENTER ONLY)
ALT: 13 U/L (ref 0–44)
AST: 36 U/L (ref 15–41)
Albumin: 3.7 g/dL (ref 3.5–5.0)
Alkaline Phosphatase: 102 U/L (ref 38–126)
Anion gap: 6 (ref 5–15)
BUN: 16 mg/dL (ref 8–23)
CO2: 25 mmol/L (ref 22–32)
Calcium: 9 mg/dL (ref 8.9–10.3)
Chloride: 107 mmol/L (ref 98–111)
Creatinine: 0.58 mg/dL (ref 0.44–1.00)
GFR, Estimated: >60 mL/min (ref >60)
Glucose, Bld: 100 mg/dL — ABNORMAL HIGH (ref 70–99)
Potassium: 3.8 mmol/L (ref 3.5–5.1)
Sodium: 138 mmol/L (ref 135–145)
Total Bilirubin: 0.3 mg/dL (ref 0.0–1.2)
Total Protein: 6.1 g/dL — ABNORMAL LOW (ref 6.5–8.1)

## 2023-05-04 LAB — TSH: TSH: 9.787 u[IU]/mL — ABNORMAL HIGH (ref 0.350–4.500)

## 2023-05-04 LAB — VITAMIN B12: Vitamin B-12: 872 pg/mL (ref 180–914)

## 2023-05-04 MED ORDER — SODIUM CHLORIDE 0.9% FLUSH
10.0000 mL | Freq: Once | INTRAVENOUS | Status: AC
Start: 2023-05-04 — End: 2023-05-04
  Administered 2023-05-04: 10 mL

## 2023-05-04 MED ORDER — SODIUM CHLORIDE 0.9% FLUSH
10.0000 mL | INTRAVENOUS | Status: DC | PRN
  Administered 2023-05-04: 10 mL

## 2023-05-04 MED ORDER — ACETAMINOPHEN 325 MG PO TABS
650.0000 mg | ORAL_TABLET | Freq: Once | ORAL | Status: AC
Start: 2023-05-04 — End: 2023-05-04
  Administered 2023-05-04: 650 mg via ORAL
  Filled 2023-05-04: qty 2

## 2023-05-04 MED ORDER — PALONOSETRON HCL INJECTION 0.25 MG/5ML
0.2500 mg | Freq: Once | INTRAVENOUS | Status: AC
  Administered 2023-05-04: 0.25 mg via INTRAVENOUS
  Filled 2023-05-04: qty 5

## 2023-05-04 MED ORDER — HEPARIN SOD (PORK) LOCK FLUSH 100 UNIT/ML IV SOLN
500.0000 [IU] | Freq: Once | INTRAVENOUS | Status: AC | PRN
Start: 2023-05-04 — End: 2023-05-04
  Administered 2023-05-04: 500 [IU]

## 2023-05-04 MED ORDER — SODIUM CHLORIDE 0.9 % IV SOLN
150.0000 mg | Freq: Once | INTRAVENOUS | Status: AC
  Administered 2023-05-04: 150 mg via INTRAVENOUS
  Filled 2023-05-04: qty 150

## 2023-05-04 MED ORDER — DIPHENHYDRAMINE HCL 25 MG PO CAPS
50.0000 mg | ORAL_CAPSULE | Freq: Once | ORAL | Status: AC
  Administered 2023-05-04: 50 mg via ORAL
  Filled 2023-05-04: qty 2

## 2023-05-04 MED ORDER — DEXAMETHASONE SODIUM PHOSPHATE 10 MG/ML IJ SOLN
10.0000 mg | Freq: Once | INTRAMUSCULAR | Status: AC
Start: 2023-05-04 — End: 2023-05-04
  Administered 2023-05-04: 10 mg via INTRAVENOUS
  Filled 2023-05-04: qty 1

## 2023-05-04 MED ORDER — FAM-TRASTUZUMAB DERUXTECAN-NXKI CHEMO 100 MG IV SOLR
3.2000 mg/kg | Freq: Once | INTRAVENOUS | Status: AC
  Administered 2023-05-04: 200 mg via INTRAVENOUS
  Filled 2023-05-04: qty 10

## 2023-05-04 MED ORDER — DEXTROSE 5 % IV SOLN: Freq: Once | INTRAVENOUS | Status: AC

## 2023-05-04 NOTE — Telephone Encounter (Signed)
Pt notified in office of CT scan appt at [facility] on 2/25 and arrive to appt at 330 pm for 4pm appt.

## 2023-05-04 NOTE — Progress Notes (Signed)
Per Lillard Anes, NP, ok to treat with elevated HR.

## 2023-05-04 NOTE — Patient Instructions (Signed)

## 2023-05-04 NOTE — Progress Notes (Unsigned)
[facility] Cancer Follow up:    Pahwani, [name], MD [address] [place] [address]   DIAGNOSIS: Cancer Staging  Malignant neoplasm of lower-outer quadrant of left breast of female, estrogen receptor positive (HCC) Staging form: Breast, AJCC 8th Edition - Clinical stage from 07/13/2017: Stage IIA (cT3, cN0, cM0, G1, ER+, PR+, HER2-) - Signed by Loa Socks, NP on 05/18/2018 Neoadjuvant therapy: No Histologic grading system: 3 grade system Laterality: Left - Pathologic: No Stage Recommended (ypT3, pN1a, cM0, G1, ER+, PR+, HER2-) - Signed by [name], MD on 11/02/2017 Stage prefix: Post-therapy Neoadjuvant therapy: Yes Response to neoadjuvant therapy: No response Histologic grading system: 3 grade system Laterality: Left Lymph-vascular invasion (LVI): BOTH lymphatic and small vessel AND venous (large vessel) invasion   SUMMARY OF ONCOLOGIC HISTORY: Oncology History  Malignant neoplasm of lower-outer quadrant of left breast of female, estrogen receptor positive (HCC)  06/11/2016 Mammogram   Palpable left breast masses 3:00 position: 2.2 cm; 5:30 position: 2.5 cm; 6:30 position: 0.7 cm   06/19/2016 Initial Diagnosis   Left breast biopsy 3:30: IDC with DCIS grade 1, ER 90%, PR 50%, Ki-67 15%, HER-2 negative ratio 1.13; biopsy 5:30 position: IDC grade 1   07/13/2016 Breast MRI   Large area of abnormal enhancement lower inner and lower outer quadrants left breast spanning 9 cm x 6.4 cm x 5.3 cm, no abnormal enlarged lymph nodes; T3 N0 stage II a (New AJCC staging)    07/15/2016 - 12/08/2017 Anti-estrogen oral therapy   Neoadjuvant anastrozole 1 mg daily   07/17/2016 Oncotype testing   Testing done on the biopsy: Oncotype DX score 22, intermediate risk   02/02/2017 Breast MRI   Left breast multicentric disease unchanged measuring 2.7 x 1.6 cm.  Mass in the non-mass enhancement are also not significantly changed measuring 6.2 x 2.4 cm. new  enhancing mass within the outer right breast 7 mm which could be fat necrosis or inclusion cyst    02/09/2017 Imaging   Ultrasound of the right breast lesion noted on MRI: No sonographic finding corresponds to the abnormality noted on MRI   07/13/2017 Cancer Staging   Staging form: Breast, AJCC 8th Edition - Clinical stage from 07/13/2017: Stage IIA (cT3, cN0, cM0, G1, ER+, PR+, HER2-) - Signed by Loa Socks, NP on 05/18/2018   10/26/2017 Surgery   Left mastectomy: IDC grade 1, 2 foci largest spans 8.5 cm, intermediate grade DCIS, lymphovascular invasion identified, perineural invasion identified, 1/2 lymph nodes positive with extracapsular extension, ER 9200%, PR 5 to 50%, HER-2 negative, Ki-67 10 to 15%, T3N1A Mammaprint: low risk   11/02/2017 Cancer Staging   Staging form: Breast, AJCC 8th Edition - Pathologic: No Stage Recommended (ypT3, pN1a, cM0, G1, ER+, PR+, HER2-) - Signed by [name], MD on 11/02/2017   12/08/2017 - 01/26/2018 Radiation Therapy   Adjuvant radiation therapy    02/2018 -  Anti-estrogen oral therapy   Anastrozole 1 mg daily adjuvant therapy   10/21/2022 -  Chemotherapy   Patient is on Treatment Plan : BREAST METASTATIC Fam-Trastuzumab Deruxtecan-nxki (Enhertu) (5.4) q21d       CURRENT THERAPY: Enhertu  INTERVAL HISTORY:  Discussed the use of AI scribe software for clinical note transcription with the patient, who gave verbal consent to proceed.  Kathryn Lucas 63 y.o. female returns for   CT chest/abd/pelvis expected 05/19/2023  Echo 03/19/2023 60-65%  Patient Active Problem List   Diagnosis Date Noted   Metastatic cancer  to spine (HCC) 10/14/2022   Elevated liver enzymes 10/14/2022   Unexplained weight loss 10/14/2022   Neoplasm related pain 10/14/2022   Metastatic malignant neoplasm (HCC) 12/19/2021   Family history of breast cancer    History of therapeutic radiation 04/27/2018   Breast cancer, left breast (HCC) 10/26/2017   Malignant  neoplasm of lower-outer quadrant of left breast of female, estrogen receptor positive (HCC) 07/15/2016   Plantar fasciitis 07/30/2015   Metatarsalgia 10/18/2014    is allergic to percocet [oxycodone-acetaminophen].  MEDICAL HISTORY: Past Medical History:  Diagnosis Date   Breast cancer (HCC)    left breast cancer   Cancer (HCC) 09/2017   left breast cancer   Family history of breast cancer    Hypothyroidism    Personal history of radiation therapy 2019   Thyroid disease     SURGICAL HISTORY: Past Surgical History:  Procedure Laterality Date   BREAST BIOPSY Left 2018   BREAST BIOPSY Left 2019   BREAST RECONSTRUCTION WITH PLACEMENT OF TISSUE EXPANDER AND ALLODERM Left 10/26/2017   Procedure: LEFT BREAST RECONSTRUCTION WITH PLACEMENT OF TISSUE EXPANDER AND ALLODERM;  Surgeon: Glenna Fellows, MD;  Location: [facility];  Service: Plastics;  Laterality: Left;   IR IMAGING GUIDED PORT INSERTION  04/27/2023   MASTECTOMY Left 2019   MASTECTOMY WITH RADIOACTIVE SEED GUIDED EXCISION AND AXILLARY SENTINEL LYMPH NODE BIOPSY Left 10/26/2017   Procedure: LEFT MASTECTOMY WITH SEED TARGETED  LEFT AXILLARY LYMPH NODE EXCISION AND LEFT SENTINEL LYMPH NODE BIOPSY;  Surgeon: Almond Lint, MD;  Location: [facility];  Service: General;  Laterality: Left;   TONSILLECTOMY     WISDOM TOOTH EXTRACTION      SOCIAL HISTORY: Social History   Socioeconomic History   Marital status: Single    Spouse name: Not on file   Number of children: Not on file   Years of education: Not on file   Highest education level: Not on file  Occupational History   Not on file  Tobacco Use   Smoking status: Never   Smokeless tobacco: Never  Vaping Use   Vaping status: Never Used  Substance and Sexual Activity   Alcohol use: Yes    Alcohol/week: 0.0 standard drinks of alcohol    Comment: social   Drug use: Never   Sexual activity: Not on file  Other Topics Concern   Not on file   Social History Narrative   Lives alone and one dog.   Social Drivers of Corporate investment banker Strain: Not on file  Food Insecurity: No Food Insecurity (09/14/2022)   Hunger Vital Sign    Worried About Running Out of Food in the Last Year: Never true    Ran Out of Food in the Last Year: Never true  Transportation Needs: No Transportation Needs (09/14/2022)   PRAPARE - Administrator, Civil Service (Medical): No    Lack of Transportation (Non-Medical): No  Physical Activity: Not on file  Stress: Not on file  Social Connections: Not on file  Intimate Partner Violence: Not At Risk (09/14/2022)   Humiliation, Afraid, Rape, and Kick questionnaire    Fear of Current or Ex-Partner: No    Emotionally Abused: No    Physically Abused: No    Sexually Abused: No    FAMILY HISTORY: Family History  Problem Relation Age of Onset   Breast cancer Mother 38   Stroke Sister        thought to be due to  tamoxifen use   Dementia Maternal Grandmother    COPD Paternal Grandfather     Review of Systems  Constitutional:  Negative for appetite change, chills, fatigue, fever and unexpected weight change.  HENT:   Negative for hearing loss, lump/mass and trouble swallowing.   Eyes:  Negative for eye problems and icterus.  Respiratory:  Negative for chest tightness, cough and shortness of breath.   Cardiovascular:  Negative for chest pain, leg swelling and palpitations.  Gastrointestinal:  Negative for abdominal distention, abdominal pain, constipation, diarrhea, nausea and vomiting.  Endocrine: Negative for hot flashes.  Genitourinary:  Negative for difficulty urinating.   Musculoskeletal:  Negative for arthralgias.  Skin:  Negative for itching and rash.  Neurological:  Negative for dizziness, extremity weakness, headaches and numbness.  Hematological:  Negative for adenopathy. Does not bruise/bleed easily.  Psychiatric/Behavioral:  Negative for depression. The patient is not  nervous/anxious.       PHYSICAL EXAMINATION    There were no vitals filed for this visit.  Physical Exam Constitutional:      General: She is not in acute distress.    Appearance: Normal appearance. She is not toxic-appearing.  HENT:     Head: Normocephalic and atraumatic.     Mouth/Throat:     Mouth: Mucous membranes are moist.     Pharynx: Oropharynx is clear. No oropharyngeal exudate or posterior oropharyngeal erythema.  Eyes:     General: No scleral icterus. Cardiovascular:     Rate and Rhythm: Normal rate and regular rhythm.     Pulses: Normal pulses.     Heart sounds: Normal heart sounds.  Pulmonary:     Effort: Pulmonary effort is normal.     Breath sounds: Normal breath sounds.  Abdominal:     General: Abdomen is flat. Bowel sounds are normal. There is no distension.     Palpations: Abdomen is soft.     Tenderness: There is no abdominal tenderness.  Musculoskeletal:        General: No swelling.     Cervical back: Neck supple.  Lymphadenopathy:     Cervical: No cervical adenopathy.  Skin:    General: Skin is warm and dry.     Findings: No rash.  Neurological:     General: No focal deficit present.     Mental Status: She is alert.  Psychiatric:        Mood and Affect: Mood normal.        Behavior: Behavior normal.     LABORATORY DATA:  CBC    Component Value Date/Time   WBC 3.7 (L) 04/13/2023 1345   WBC 6.6 08/07/2021 1622   RBC 3.71 (L) 04/13/2023 1345   HGB 12.4 04/13/2023 1345   HCT 38.3 04/13/2023 1345   PLT 236 04/13/2023 1345   MCV 103.2 (H) 04/13/2023 1345   MCH 33.4 04/13/2023 1345   MCHC 32.4 04/13/2023 1345   RDW 17.4 (H) 04/13/2023 1345   LYMPHSABS 0.4 (L) 04/13/2023 1345   MONOABS 0.5 04/13/2023 1345   EOSABS 0.1 04/13/2023 1345   BASOSABS 0.0 04/13/2023 1345    CMP     Component Value Date/Time   NA 139 04/13/2023 1345   K 4.2 04/13/2023 1345   CL 108 04/13/2023 1345   CO2 26 04/13/2023 1345   GLUCOSE 75 04/13/2023 1345    BUN 14 04/13/2023 1345   CREATININE 0.65 04/13/2023 1345   CALCIUM 9.6 04/13/2023 1345   PROT 6.3 (L) 04/13/2023 1345   ALBUMIN 3.7  04/13/2023 1345   AST 32 04/13/2023 1345   ALT 10 04/13/2023 1345   ALKPHOS 95 04/13/2023 1345   BILITOT 0.4 04/13/2023 1345   GFRNONAA >60 04/13/2023 1345        ASSESSMENT and THERAPY PLAN:   No problem-specific Assessment & Plan notes found for this encounter.    All questions were answered. The patient knows to call the clinic with any problems, questions or concerns. We can certainly see the patient much sooner if necessary.  Total encounter time:*** minutes*in face-to-face visit time, chart review, lab review, care coordination, order entry, and documentation of the encounter time.    Lillard Anes, NP 05/04/23 1:06 PM Medical Oncology and Hematology [facility] [address] [place], [place] [address] Tel. [phone]    Fax. [phone]  *Total Encounter Time as defined by the Centers for Medicare and Medicaid Services includes, in addition to the face-to-face time of a patient visit (documented in the note above) non-face-to-face time: obtaining and reviewing outside history, ordering and reviewing medications, tests or procedures, care coordination (communications with other health care professionals or caregivers) and documentation in the medical record.

## 2023-05-05 ENCOUNTER — Other Ambulatory Visit: Payer: Self-pay | Admitting: *Deleted

## 2023-05-05 ENCOUNTER — Encounter: Payer: Self-pay | Admitting: Hematology and Oncology

## 2023-05-05 DIAGNOSIS — E039 Hypothyroidism, unspecified: Secondary | ICD-10-CM

## 2023-05-05 LAB — T4, FREE: Free T4: 1.04 ng/dL (ref 0.61–1.12)

## 2023-05-05 MED ORDER — LIDOCAINE-PRILOCAINE 2.5-2.5 % EX CREA: 1.0000 | TOPICAL_CREAM | CUTANEOUS | 1 refills | Status: DC | PRN

## 2023-05-06 ENCOUNTER — Other Ambulatory Visit: Payer: Self-pay | Admitting: *Deleted

## 2023-05-06 ENCOUNTER — Encounter: Payer: Self-pay | Admitting: Hematology and Oncology

## 2023-05-06 DIAGNOSIS — E039 Hypothyroidism, unspecified: Secondary | ICD-10-CM

## 2023-05-06 MED ORDER — LEVOTHYROXINE SODIUM 125 MCG PO TABS: 125.0000 ug | ORAL_TABLET | Freq: Every day | ORAL | 0 refills | Status: DC

## 2023-05-06 NOTE — Progress Notes (Signed)
MD reviewed pt recent TSH and verbal orders received to increase Levothyroxine to 125 mcg orally daily.  Prescription sent to pharmacy on file, pt educated and verbalized understanding.

## 2023-05-07 ENCOUNTER — Encounter: Payer: Self-pay | Admitting: Hematology and Oncology

## 2023-05-07 NOTE — Assessment & Plan Note (Signed)
10/26/17: Left mastectomy: IDC grade 1, 2 foci largest spans 8.5 cm, intermediate grade DCIS, lymphovascular invasion identified, perineural invasion identified, 1/2 lymph nodes positive with extracapsular extension, ER 9200%, PR 5 to 50%, HER-2 negative, Ki-67 10 to 15%, T3N1A   Oncotype DX score 22, intermediate risk, chemotherapy not felt to have significant benefit.   Treatment Summary: 1. Antiestrogen therapy with anastrozole 1 mg daily started 07/15/2016 2. Mastectomy 10/26/2017, Mammaprint low risk luminal type A 3. Followed by adjuvant radiation 12/08/17- 01/26/18  4. Followed by adjuvant antiestrogen therapy anastrozole started 01/17/2018 (originally started 07/15/2016) -------------------------------------------------------------------- Low back pain August 2023: Underwent CT myelogram: Large expansile lesion in the sacrum with extraosseous extension of the tumor, diffuse lytic lesions throughout the visualized spine with metastatic disease myeloma is considered less likely.  (This was ordered by Dr. Marcene Corning)   Treatment plan: 1.  PET CT scan 12/27/2021: Widespread bone metastatic disease largest lesion involving the sacrum with pathological fractures of T6 and T10 retroperitoneal and pelvic lymph node metastasis, right axillary lymph node, activity in the pancreatic head, hypermetabolic activity in the adrenal glands, right thyroid nodule. 2. biopsy of sacrum: 12/26/2021: Metastatic breast cancer, ER 90%, PR 10%, HER2 negative (0) 3.  Ibrance along with Faslodex started 12/25/2021-10/14/2022 4.  Xgeva for bone metastases.  Every 3 months 5.  Palliative radiation to the sacrum completed 01/19/2022 6.  Enhertu began 10/21/2022  PET/CT 10/29/2022: Overall improved appearance of bone lesions.  Stable right extra lymph node, retroperitoneal lymph nodes decreased activity CT head ordered by Dr. Barbaraann Cao 11/26/2022: Extensive osseous mets on the skull, extraosseous soft tissue tumor in the right  orbit appears improved, inseparable from anterior aspect of superior sagittal sinus and left sigmoid sinus.  Unchanged innumerable scalp nodules CT CAP 02/24/2023: Liver lesions diminished in size sclerosis of widespread bone metastases   ---------------------------------------------------------------------------------------------------------------------- Current treatment: Enhertu started 10/21/2022, today is cycle 8 We plan to continue this treatment irrespective of her blood counts.   Enhertu toxicities: Metastatic Breast Cancer Noted recurrence of skin nodules in the same location as prior lesions. Concern for local skin recurrence. No new symptoms reported. - Proceed with today's treatment. - Schedule imaging for earlier date (prior to next treatment cycle) to assess for disease progression. - Discuss potential change in treatment plan based on imaging results with Dr. Georgiann Mohs.  General Health Maintenance - Monitor lab results for kidney function, liver function, and TSH.  RTC after scans to discuss with Dr. Pamelia Hoit.

## 2023-05-10 ENCOUNTER — Encounter: Payer: Self-pay | Admitting: Hematology and Oncology

## 2023-05-11 ENCOUNTER — Ambulatory Visit: Payer: Medicaid Other

## 2023-05-11 ENCOUNTER — Ambulatory Visit
Admission: RE | Admit: 2023-05-11 | Discharge: 2023-05-11 | Disposition: A | Discharge status: Home / Self Care | Payer: Medicaid Other | Source: Ambulatory Visit | Attending: Hematology and Oncology | Admitting: Hematology and Oncology

## 2023-05-11 ENCOUNTER — Other Ambulatory Visit: Payer: Self-pay | Admitting: Adult Health

## 2023-05-11 ENCOUNTER — Telehealth: Payer: Self-pay | Admitting: Hematology and Oncology

## 2023-05-11 DIAGNOSIS — C7951 Secondary malignant neoplasm of bone: Secondary | ICD-10-CM | POA: Insufficient documentation

## 2023-05-11 DIAGNOSIS — Z170 Estrogen receptor positive status [ER+]: Secondary | ICD-10-CM | POA: Insufficient documentation

## 2023-05-11 DIAGNOSIS — C50512 Malignant neoplasm of lower-outer quadrant of left female breast: Secondary | ICD-10-CM | POA: Diagnosis present

## 2023-05-11 DIAGNOSIS — R7989 Other specified abnormal findings of blood chemistry: Secondary | ICD-10-CM

## 2023-05-11 MED ORDER — HEPARIN SOD (PORK) LOCK FLUSH 100 UNIT/ML IV SOLN
500.0000 [IU] | Freq: Once | INTRAVENOUS | Status: AC
  Administered 2023-05-11: 500 [IU] via INTRAVENOUS

## 2023-05-11 MED ORDER — IOHEXOL 300 MG/ML  SOLN
85.0000 mL | Freq: Once | INTRAMUSCULAR | Status: AC | PRN
  Administered 2023-05-11: 85 mL via INTRAVENOUS

## 2023-05-11 NOTE — Telephone Encounter (Signed)
 Scheduled appointments per WQ. Left VM with appointment details.

## 2023-05-12 ENCOUNTER — Encounter: Payer: Self-pay | Admitting: Hematology and Oncology

## 2023-05-12 ENCOUNTER — Ambulatory Visit: Payer: Medicaid Other

## 2023-05-12 ENCOUNTER — Telehealth: Payer: Self-pay

## 2023-05-12 DIAGNOSIS — R490 Dysphonia: Secondary | ICD-10-CM

## 2023-05-12 NOTE — Therapy (Signed)
 OUTPATIENT SPEECH LANGUAGE PATHOLOGY  VOICE TREATMENT   Patient Name: Kathryn Lucas MRN: [id number] DOB:1960-06-26, 63 y.o., female Today's Date: 05/12/2023  PCP: Jacqulyn Bath, MD  REFERRING PROVIDER: Bonney Roussel, [name]   End of Session - 05/12/23 1447     Visit Number 7    Number of Visits 24    Date for SLP Re-Evaluation 06/23/23    Authorization Type carelon order#[id number]    Authorization Time Period 1/20-3/20 for 8 SLP visits    Authorization - Visit Number 6    Authorization - Number of Visits 8    Progress Note Due on Visit 10    SLP Start Time 1446    SLP Stop Time  1530    SLP Time Calculation (min) 44 min    Activity Tolerance Patient tolerated treatment well             Past Medical History:  Diagnosis Date   Breast cancer (HCC)    left breast cancer   Cancer (HCC) 09/2017   left breast cancer   Family history of breast cancer    Hypothyroidism    Personal history of radiation therapy 2019   Thyroid disease    Past Surgical History:  Procedure Laterality Date   BREAST BIOPSY Left 2018   BREAST BIOPSY Left 2019   BREAST RECONSTRUCTION WITH PLACEMENT OF TISSUE EXPANDER AND ALLODERM Left 10/26/2017   Procedure: LEFT BREAST RECONSTRUCTION WITH PLACEMENT OF TISSUE EXPANDER AND ALLODERM;  Surgeon: Glenna Fellows, MD;  Location: [facility];  Service: Plastics;  Laterality: Left;   IR IMAGING GUIDED PORT INSERTION  04/27/2023   MASTECTOMY Left 2019   MASTECTOMY WITH RADIOACTIVE SEED GUIDED EXCISION AND AXILLARY SENTINEL LYMPH NODE BIOPSY Left 10/26/2017   Procedure: LEFT MASTECTOMY WITH SEED TARGETED  LEFT AXILLARY LYMPH NODE EXCISION AND LEFT SENTINEL LYMPH NODE BIOPSY;  Surgeon: Almond Lint, MD;  Location: [facility];  Service: General;  Laterality: Left;   TONSILLECTOMY     WISDOM TOOTH EXTRACTION     Patient Active Problem List   Diagnosis Date Noted   Metastatic 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) 10/14/2022   Elevated liver enzymes  10/14/2022   Unexplained weight loss 10/14/2022   Neoplasm related pain 10/14/2022   Metastatic malignant neoplasm (HCC) 12/19/2021   Family history of breast cancer    History of therapeutic radiation 04/27/2018   Breast cancer, left breast (HCC) 10/26/2017   Malignant neoplasm of lower-outer quadrant of left breast of female, estrogen receptor positive (HCC) 07/15/2016   Plantar fasciitis 07/30/2015   Metatarsalgia 10/18/2014    ONSET DATE:  03/29/23 (referral); since July 2024 per pt  REFERRING DIAG: Hoarseness  THERAPY DIAG:  Dysphonia  Hoarseness  Rationale for Evaluation and Treatment Rehabilitation  SUBJECTIVE:   SUBJECTIVE STATEMENT: Pt alert, pleasant, and cooperative. Dysphonia apparent. Pt accompanied by: self  PERTINENT HISTORY: 63 y.o. female who presents for voice evaluation. 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 On endoscopy, 1/13, pt with weakness and bowing of B vocal cords with more weakness on L than R. Pt referred for course ST prior to consideration laryngologist referral per ENT note.  DIAGNOSTIC FINDINGS: 02/18/23, CT Chest Abdomen Pelvis, "1. Hypodense lesion of the medial liver dome, in retrospective review diminished in size compared to prior PET-CT examination. Findings are consistent with treatment response of hepatic metastatic disease. 2. Slightly increased sclerosis of widespread mixed lytic and sclerotic osseous metastatic disease throughout the  axial skeleton, consistent with treatment response. 3. Unchanged post treatment/post radiation appearance of the right chest, with perihilar fibrosis and consolidation. 4. Status post left mastectomy with saline spacer."  PAIN:  Are you having pain? No (FACES)   FALLS: defer to PT  LIVING ENVIRONMENT: Lives with: lives alone   PLOF: Independent  PATIENT GOALS    for voice to be stronger  OBJECTIVE:  TODAY'S  TREATMENT: Introduced Passenger transport manager ex's. Pt completed as follows with use of biofeedback and min verbal cues: Sustained "ah" x10, ~5-6s, 85dB Ascending pitch glides x10, 82dB Descending pitch glides x10, 82dB Functional phrases: Pt read aloud twice in both a higher pitch, loud voice and a lower pitch, authoritative voice averaging 85dB.  Conversation level training - pt averaged 76dB during 20 minutes of conversation. Pt endorsed difficulty "not straining" voice in Bible study room due to poor acoustics. Will target avoiding strain with competing background noise next session.  Pt also endorsed that her voice is "getting stronger" and she didn't need to use speakerphone while talking to a friend the other day.    PATIENT EDUCATION: Education details: HEP, POC Person educated: Patient Education method: Explanation; Handout Education comprehension: verbalized understanding   HOME EXERCISE PROGRAM: Phonation Resistance Ex's      GOALS: Goals reviewed with patient? Yes  SHORT TERM GOALS: Target date: 10 sessions  Pt will improve water intake to 5-8 glasses per day per pt report. Baseline: Goal status: INITIAL  2.  The patient will demonstrate abdominal breathing patterns and steady release of breath on exhalation to optimize efficiency of voicing and decrease laryngeal hyperfunction with min cueing.  Baseline:  Goal status: INITIAL  3.  The patient will eliminate phonotraumatic behaviors such as chronic throat clearing, by substituting non-traumatic methods to clear mucus with min cueing.  Baseline:  Goal status: INITIAL  4.  The patient will utilize a forward tone focused/resonant voice to decrease vocal hyperfunction and improve voice quality and vocal projection with min cueing. Baseline:  Goal status: INITIAL  5.  Pt will perform HEP for voice with min cueing. Baseline:  Goal status: INITIAL  6.  The patient will participate in 5-8 minutes conversation,  maintaining average loudness of 75 dB and loud, good quality voice with min cues.     Baseline:  Goal status: INITIAL  LONG TERM GOALS: Target date: 12 weeks  Pt will improve score on the VHI by 10% indicating improved perception of speech abilities. Baseline: 42/120 Goal status: INITIAL  2.  The patient will demonstrate abdominal breathing patterns and steady release of breath on exhalation to optimize efficiency of voicing and decrease laryngeal hyperfunction.  Baseline:  Goal status: INITIAL    3.  Pt will perform HEP for voice independently. Baseline:  Goal status: INITIAL  4.  The patient will participate in 15-20 minutes conversation, maintaining average loudness of 75 dB and loud, good quality voice with min cues.     Baseline:  Goal status: INITIAL  ASSESSMENT:  CLINICAL IMPRESSION: Patient is a 63 y.o. female who presents for voice treatment. 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 On endoscopy, 03/29/23, pt with weakness and bowing of B vocal cords with more weakness on L than R. Pt referred for course ST prior to consideration laryngologist referral per ENT note. Pt's voice with perceptual characteristics of breathiness, hoarseness, volume decay, and diplophonia. Pt with tendency to utilize clavicular breathing. Pt with stimulable to resonant  voice therapy and clear speech therapy. Pt reports dissatisfactoin with voice particularly in noisy environments or instances when she needs to project (e.g. bible study). See details of today's treatment above. Recommend course of speech therapy to improve pt's voice in social settings and for ADLs/iADLs.  OBJECTIVE IMPAIRMENTS include voice disorder. These impairments are limiting patient from effectively communicating at home and in community. Factors affecting potential to achieve goals and functional outcome are  overall medical status/comorbidities . Patient will benefit  from skilled SLP services to address above impairments and improve overall function.  REHAB POTENTIAL: Good  PLAN: SLP FREQUENCY: 1-2x/week  SLP DURATION: 12 weeks  PLANNED INTERVENTIONS: Cueing hierachy, Internal/external aids, Functional tasks, Multimodal communication approach, SLP instruction and feedback, Compensatory strategies, and Patient/family education    Clyde Canterbury, M.S., CCC-SLP Speech-Language Pathologist [facility] [phone] Arnette Felts)  [facility] at [facility] [address] [place], [place], [address] Phone: [phone]   Fax:  [phone]

## 2023-05-12 NOTE — Telephone Encounter (Signed)
 Patient called and stated that she is concerned about losing her [employer] Medicaid benefits after hearing a report on the news about budget cuts. Patient informed I will reach out to our DSS Representative in [facility] and find out if she needs to take any courses of action, such as medicare, as patient did ask if she needed to apply early. Per Tamela Gammon, DSS Representative, Patient was informed no word of any budgets cuts, benefits are still active, needs to contact DSS directly to find out if she is eligible to apply for medicare. Patient was reassured that for now benefits are active, will call if anything changes.

## 2023-05-17 ENCOUNTER — Ambulatory Visit: Payer: Medicaid Other | Attending: Student

## 2023-05-17 DIAGNOSIS — R490 Dysphonia: Secondary | ICD-10-CM | POA: Diagnosis present

## 2023-05-17 NOTE — Therapy (Signed)
 OUTPATIENT SPEECH LANGUAGE PATHOLOGY  VOICE TREATMENT   Patient Name: Kathryn Lucas MRN: [id number] DOB:[birth year], 63 y.o., female Today's Date: 05/17/2023  PCP: Jacqulyn Bath, MD  REFERRING PROVIDER: Bonney Roussel, [name]   End of Session - 05/17/23 1628     Visit Number 8    Number of Visits 24    Date for SLP Re-Evaluation 06/23/23    Authorization Type carelon order#[id number]    Authorization Time Period 1/20-3/20 for 8 SLP visits    Authorization - Visit Number 7    Authorization - Number of Visits 8    Progress Note Due on Visit 10    SLP Start Time 1430    SLP Stop Time  1515    SLP Time Calculation (min) 45 min    Activity Tolerance Patient tolerated treatment well             Past Medical History:  Diagnosis Date   Breast cancer (HCC)    left breast cancer   Cancer (HCC) 09/2017   left breast cancer   Family history of breast cancer    Hypothyroidism    Personal history of radiation therapy 2019   Thyroid disease    Past Surgical History:  Procedure Laterality Date   BREAST BIOPSY Left 2018   BREAST BIOPSY Left 2019   BREAST RECONSTRUCTION WITH PLACEMENT OF TISSUE EXPANDER AND ALLODERM Left 10/26/2017   Procedure: LEFT BREAST RECONSTRUCTION WITH PLACEMENT OF TISSUE EXPANDER AND ALLODERM;  Surgeon: Glenna Fellows, MD;  Location: [facility];  Service: Plastics;  Laterality: Left;   IR IMAGING GUIDED PORT INSERTION  04/27/2023   MASTECTOMY Left 2019   MASTECTOMY WITH RADIOACTIVE SEED GUIDED EXCISION AND AXILLARY SENTINEL LYMPH NODE BIOPSY Left 10/26/2017   Procedure: LEFT MASTECTOMY WITH SEED TARGETED  LEFT AXILLARY LYMPH NODE EXCISION AND LEFT SENTINEL LYMPH NODE BIOPSY;  Surgeon: Almond Lint, MD;  Location: [facility];  Service: General;  Laterality: Left;   TONSILLECTOMY     WISDOM TOOTH EXTRACTION     Patient Active Problem List   Diagnosis Date Noted   Metastatic 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) 10/14/2022   Elevated liver enzymes 10/14/2022    Unexplained weight loss 10/14/2022   Neoplasm related pain 10/14/2022   Metastatic malignant neoplasm (HCC) 12/19/2021   Family history of breast cancer    History of therapeutic radiation 04/27/2018   Breast cancer, left breast (HCC) 10/26/2017   Malignant neoplasm of lower-outer quadrant of left breast of female, estrogen receptor positive (HCC) 07/15/2016   Plantar fasciitis 07/30/2015   Metatarsalgia 10/18/2014    ONSET DATE:  03/29/23 (referral); since July 2024 per pt  REFERRING DIAG: Hoarseness  THERAPY DIAG:  Dysphonia  Hoarseness  Rationale for Evaluation and Treatment Rehabilitation  SUBJECTIVE:   SUBJECTIVE STATEMENT: Pt alert, pleasant, and cooperative. Dysphonia apparent. Pt accompanied by: self  PERTINENT HISTORY: 63 y.o. female who presents for voice evaluation. 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 On endoscopy, 1/13, pt with weakness and bowing of B vocal cords with more weakness on L than R. Pt referred for course ST prior to consideration laryngologist referral per ENT note.  DIAGNOSTIC FINDINGS: 02/18/23, CT Chest Abdomen Pelvis, "1. Hypodense lesion of the medial liver dome, in retrospective review diminished in size compared to prior PET-CT examination. Findings are consistent with treatment response of hepatic metastatic disease. 2. Slightly increased sclerosis of widespread mixed lytic and sclerotic osseous metastatic disease throughout the  axial skeleton, consistent with treatment response. 3. Unchanged post treatment/post radiation appearance of the right chest, with perihilar fibrosis and consolidation. 4. Status post left mastectomy with saline spacer."  PAIN:  Are you having pain? No (FACES)   FALLS: defer to PT  LIVING ENVIRONMENT: Lives with: lives alone   PLOF: Independent  PATIENT GOALS    for voice to be stronger  OBJECTIVE:  TODAY'S TREATMENT: Conversation level  training - pt averaged 77dB during 35 minutes of conversation with an without background noise (busy cafe track from YouTube). Pt denied strain/fatigue despite introduction of background noise.  Pt also endorsed that her voice is "getting stronger" and she didn't need to use speakerphone during telephone conversations today.    PATIENT EDUCATION: Education details: HEP, POC Person educated: Patient Education method: Explanation; Handout Education comprehension: verbalized understanding   HOME EXERCISE PROGRAM: Phonation Resistance Ex's      GOALS: Goals reviewed with patient? Yes  SHORT TERM GOALS: Target date: 10 sessions  Pt will improve water intake to 5-8 glasses per day per pt report. Baseline: Goal status: INITIAL  2.  The patient will demonstrate abdominal breathing patterns and steady release of breath on exhalation to optimize efficiency of voicing and decrease laryngeal hyperfunction with min cueing.  Baseline:  Goal status: INITIAL  3.  The patient will eliminate phonotraumatic behaviors such as chronic throat clearing, by substituting non-traumatic methods to clear mucus with min cueing.  Baseline:  Goal status: INITIAL  4.  The patient will utilize a forward tone focused/resonant voice to decrease vocal hyperfunction and improve voice quality and vocal projection with min cueing. Baseline:  Goal status: INITIAL  5.  Pt will perform HEP for voice with min cueing. Baseline:  Goal status: INITIAL  6.  The patient will participate in 5-8 minutes conversation, maintaining average loudness of 75 dB and loud, good quality voice with min cues.     Baseline:  Goal status: INITIAL  LONG TERM GOALS: Target date: 12 weeks  Pt will improve score on the VHI by 10% indicating improved perception of speech abilities. Baseline: 42/120 Goal status: INITIAL  2.  The patient will demonstrate abdominal breathing patterns and steady release of breath on exhalation to  optimize efficiency of voicing and decrease laryngeal hyperfunction.  Baseline:  Goal status: INITIAL    3.  Pt will perform HEP for voice independently. Baseline:  Goal status: INITIAL  4.  The patient will participate in 15-20 minutes conversation, maintaining average loudness of 75 dB and loud, good quality voice with min cues.     Baseline:  Goal status: INITIAL  ASSESSMENT:  CLINICAL IMPRESSION: Patient is a 63 y.o. female who presents for voice treatment. 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 On endoscopy, 03/29/23, pt with weakness and bowing of B vocal cords with more weakness on L than R. Pt referred for course ST prior to consideration laryngologist referral per ENT note. Pt's voice with perceptual characteristics of breathiness, hoarseness, volume decay, and diplophonia. Pt with tendency to utilize clavicular breathing. Pt with stimulable to resonant voice therapy and clear speech therapy. Pt reports dissatisfactoin with voice particularly in noisy environments or instances when she needs to project (e.g. bible study). See details of today's treatment above. Recommend course of speech therapy to improve pt's voice in social settings and for ADLs/iADLs.  OBJECTIVE IMPAIRMENTS include voice disorder. These impairments are limiting patient from effectively communicating at home and  in community. Factors affecting potential to achieve goals and functional outcome are  overall medical status/comorbidities . Patient will benefit from skilled SLP services to address above impairments and improve overall function.  REHAB POTENTIAL: Good  PLAN: SLP FREQUENCY: 1-2x/week  SLP DURATION: 12 weeks  PLANNED INTERVENTIONS: Cueing hierachy, Internal/external aids, Functional tasks, Multimodal communication approach, SLP instruction and feedback, Compensatory strategies, and Patient/family education    Clyde Canterbury, M.S.,  CCC-SLP Speech-Language Pathologist [facility] [phone] Arnette Felts)  [facility] Out[facility] Rehabilitation at [facility] [address] [place], [place], [address] Phone: [phone]   Fax:  [phone]

## 2023-05-19 ENCOUNTER — Ambulatory Visit: Payer: Medicaid Other | Admitting: Hematology and Oncology

## 2023-05-24 ENCOUNTER — Ambulatory Visit: Payer: Medicaid Other

## 2023-05-24 DIAGNOSIS — R490 Dysphonia: Secondary | ICD-10-CM

## 2023-05-24 MED FILL — Fosaprepitant Dimeglumine For IV Infusion 150 MG (Base Eq): INTRAVENOUS | Qty: 5 | Status: AC

## 2023-05-24 NOTE — Therapy (Signed)
 OUTPATIENT SPEECH LANGUAGE PATHOLOGY  VOICE TREATMENT   Patient Name: Kathryn Lucas MRN: [id number] DOB:1960-08-13, 63 y.o., female Today's Date: 05/24/2023  PCP: Jacqulyn Bath, MD  REFERRING PROVIDER: Bonney Roussel, [name]   End of Session - 05/24/23 1629     Visit Number 9    Number of Visits 24    Date for SLP Re-Evaluation 06/23/23    Authorization Type carelon order#[id number]    Authorization Time Period 1/20-3/20 for 8 SLP visits    Authorization - Visit Number 8    Authorization - Number of Visits 8    Progress Note Due on Visit 10    SLP Start Time 1530    SLP Stop Time  1615    SLP Time Calculation (min) 45 min    Activity Tolerance Patient tolerated treatment well             Past Medical History:  Diagnosis Date   Breast cancer (HCC)    left breast cancer   Cancer (HCC) 09/2017   left breast cancer   Family history of breast cancer    Hypothyroidism    Personal history of radiation therapy 2019   Thyroid disease    Past Surgical History:  Procedure Laterality Date   BREAST BIOPSY Left 2018   BREAST BIOPSY Left 2019   BREAST RECONSTRUCTION WITH PLACEMENT OF TISSUE EXPANDER AND ALLODERM Left 10/26/2017   Procedure: LEFT BREAST RECONSTRUCTION WITH PLACEMENT OF TISSUE EXPANDER AND ALLODERM;  Surgeon: Glenna Fellows, MD;  Location: [facility];  Service: Plastics;  Laterality: Left;   IR IMAGING GUIDED PORT INSERTION  04/27/2023   MASTECTOMY Left 2019   MASTECTOMY WITH RADIOACTIVE SEED GUIDED EXCISION AND AXILLARY SENTINEL LYMPH NODE BIOPSY Left 10/26/2017   Procedure: LEFT MASTECTOMY WITH SEED TARGETED  LEFT AXILLARY LYMPH NODE EXCISION AND LEFT SENTINEL LYMPH NODE BIOPSY;  Surgeon: Almond Lint, MD;  Location: [facility];  Service: General;  Laterality: Left;   TONSILLECTOMY     WISDOM TOOTH EXTRACTION     Patient Active Problem List   Diagnosis Date Noted   Metastatic 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) 10/14/2022   Elevated liver enzymes  10/14/2022   Unexplained weight loss 10/14/2022   Neoplasm related pain 10/14/2022   Metastatic malignant neoplasm (HCC) 12/19/2021   Family history of breast cancer    History of therapeutic radiation 04/27/2018   Breast cancer, left breast (HCC) 10/26/2017   Malignant neoplasm of lower-outer quadrant of left breast of female, estrogen receptor positive (HCC) 07/15/2016   Plantar fasciitis 07/30/2015   Metatarsalgia 10/18/2014    ONSET DATE:  03/29/23 (referral); since July 2024 per pt  REFERRING DIAG: Hoarseness  THERAPY DIAG:  Dysphonia  Hoarseness  Rationale for Evaluation and Treatment Rehabilitation  SUBJECTIVE:   SUBJECTIVE STATEMENT: Pt alert, pleasant, and cooperative. Dysphonia apparent. Pt accompanied by: self  PERTINENT HISTORY: 63 y.o. female who presents for voice evaluation. 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 On endoscopy, 1/13, pt with weakness and bowing of B vocal cords with more weakness on L than R. Pt referred for course ST prior to consideration laryngologist referral per ENT note.  DIAGNOSTIC FINDINGS: 02/18/23, CT Chest Abdomen Pelvis, "1. Hypodense lesion of the medial liver dome, in retrospective review diminished in size compared to prior PET-CT examination. Findings are consistent with treatment response of hepatic metastatic disease. 2. Slightly increased sclerosis of widespread mixed lytic and sclerotic osseous metastatic disease throughout the  axial skeleton, consistent with treatment response. 3. Unchanged post treatment/post radiation appearance of the right chest, with perihilar fibrosis and consolidation. 4. Status post left mastectomy with saline spacer."  PAIN:  Are you having pain? No (FACES)   FALLS: defer to PT  LIVING ENVIRONMENT: Lives with: lives alone   PLOF: Independent  PATIENT GOALS    for voice to be stronger  OBJECTIVE:  TODAY'S  TREATMENT: Introduced Tourist information centre manager therapy. Pt completed at the word, phrase, and sentence level with min cueing. Notable improvement in vocal quality. Pt identified improved vocal quality when utilizing "low" and "loud" voice. At the conversation level, pt averaged 77dB during 25 minutes of conversation. Minimal vocal hoarseness appreciated. Pt with emerging use of forward tone/resonant voice at the conversation level.    PATIENT EDUCATION: Education details: HEP, POC Person educated: Patient Education method: Explanation; Handout Education comprehension: verbalized understanding   HOME EXERCISE PROGRAM: Phonation Resistance Ex's Resonant voice therapy handout      GOALS: Goals reviewed with patient? Yes  SHORT TERM GOALS: Target date: 10 sessions  Pt will improve water intake to 5-8 glasses per day per pt report. Baseline: Goal status: INITIAL  2.  The patient will demonstrate abdominal breathing patterns and steady release of breath on exhalation to optimize efficiency of voicing and decrease laryngeal hyperfunction with min cueing.  Baseline:  Goal status: INITIAL  3.  The patient will eliminate phonotraumatic behaviors such as chronic throat clearing, by substituting non-traumatic methods to clear mucus with min cueing.  Baseline:  Goal status: INITIAL  4.  The patient will utilize a forward tone focused/resonant voice to decrease vocal hyperfunction and improve voice quality and vocal projection with min cueing. Baseline:  Goal status: INITIAL  5.  Pt will perform HEP for voice with min cueing. Baseline:  Goal status: INITIAL  6.  The patient will participate in 5-8 minutes conversation, maintaining average loudness of 75 dB and loud, good quality voice with min cues.     Baseline:  Goal status: INITIAL  LONG TERM GOALS: Target date: 12 weeks  Pt will improve score on the VHI by 10% indicating improved perception of speech abilities. Baseline: 42/120 Goal  status: INITIAL  2.  The patient will demonstrate abdominal breathing patterns and steady release of breath on exhalation to optimize efficiency of voicing and decrease laryngeal hyperfunction.  Baseline:  Goal status: INITIAL    3.  Pt will perform HEP for voice independently. Baseline:  Goal status: INITIAL  4.  The patient will participate in 15-20 minutes conversation, maintaining average loudness of 75 dB and loud, good quality voice with min cues.     Baseline:  Goal status: INITIAL  ASSESSMENT:  CLINICAL IMPRESSION: Patient is a 63 y.o. female who presents for voice treatment. 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 On endoscopy, 03/29/23, pt with weakness and bowing of B vocal cords with more weakness on L than R. Pt referred for course ST prior to consideration laryngologist referral per ENT note. Pt's voice with perceptual characteristics of breathiness, hoarseness, volume decay, and diplophonia. Pt with tendency to utilize clavicular breathing. Pt with stimulable to resonant voice therapy and clear speech therapy. Pt reports dissatisfactoin with voice particularly in noisy environments or instances when she needs to project (e.g. bible study). See details of today's treatment above. Recommend course of speech therapy to improve pt's voice in social settings and for ADLs/iADLs.  OBJECTIVE IMPAIRMENTS include  voice disorder. These impairments are limiting patient from effectively communicating at home and in community. Factors affecting potential to achieve goals and functional outcome are  overall medical status/comorbidities . Patient will benefit from skilled SLP services to address above impairments and improve overall function.  REHAB POTENTIAL: Good  PLAN: SLP FREQUENCY: 1-2x/week  SLP DURATION: 12 weeks  PLANNED INTERVENTIONS: Cueing hierachy, Internal/external aids, Functional tasks, Multimodal communication  approach, SLP instruction and feedback, Compensatory strategies, and Patient/family education    Clyde Canterbury, M.S., CCC-SLP Speech-Language Pathologist [place] [facility] [phone] Arnette Felts)  [facility] at [facility] [address] [place], [place], [address] Phone: [phone]   Fax:  [phone]

## 2023-05-25 ENCOUNTER — Inpatient Hospital Stay: Payer: Medicaid Other

## 2023-05-25 ENCOUNTER — Inpatient Hospital Stay: Payer: Medicaid Other | Attending: Hematology and Oncology | Admitting: Hematology and Oncology

## 2023-05-25 ENCOUNTER — Other Ambulatory Visit: Payer: Medicaid Other

## 2023-05-25 VITALS — BP 126/89 | Temp 98.5°F | Resp 18 | Ht 70.0 in | Wt 154.6 lb

## 2023-05-25 VITALS — HR 97

## 2023-05-25 DIAGNOSIS — M25519 Pain in unspecified shoulder: Secondary | ICD-10-CM | POA: Insufficient documentation

## 2023-05-25 DIAGNOSIS — Z9221 Personal history of antineoplastic chemotherapy: Secondary | ICD-10-CM | POA: Insufficient documentation

## 2023-05-25 DIAGNOSIS — C799 Secondary malignant neoplasm of unspecified site: Secondary | ICD-10-CM

## 2023-05-25 DIAGNOSIS — Z5112 Encounter for antineoplastic immunotherapy: Secondary | ICD-10-CM | POA: Diagnosis present

## 2023-05-25 DIAGNOSIS — Z923 Personal history of irradiation: Secondary | ICD-10-CM | POA: Insufficient documentation

## 2023-05-25 DIAGNOSIS — Z79811 Long term (current) use of aromatase inhibitors: Secondary | ICD-10-CM | POA: Diagnosis not present

## 2023-05-25 DIAGNOSIS — Z170 Estrogen receptor positive status [ER+]: Secondary | ICD-10-CM | POA: Insufficient documentation

## 2023-05-25 DIAGNOSIS — Z9012 Acquired absence of left breast and nipple: Secondary | ICD-10-CM | POA: Insufficient documentation

## 2023-05-25 DIAGNOSIS — C50512 Malignant neoplasm of lower-outer quadrant of left female breast: Secondary | ICD-10-CM | POA: Diagnosis not present

## 2023-05-25 DIAGNOSIS — J383 Other diseases of vocal cords: Secondary | ICD-10-CM | POA: Insufficient documentation

## 2023-05-25 DIAGNOSIS — R739 Hyperglycemia, unspecified: Secondary | ICD-10-CM | POA: Diagnosis not present

## 2023-05-25 DIAGNOSIS — Z79899 Other long term (current) drug therapy: Secondary | ICD-10-CM | POA: Insufficient documentation

## 2023-05-25 DIAGNOSIS — C7951 Secondary malignant neoplasm of bone: Secondary | ICD-10-CM | POA: Diagnosis present

## 2023-05-25 DIAGNOSIS — E039 Hypothyroidism, unspecified: Secondary | ICD-10-CM

## 2023-05-25 LAB — T4, FREE: Free T4: 1.07 ng/dL (ref 0.61–1.12)

## 2023-05-25 LAB — CBC WITH DIFFERENTIAL (CANCER CENTER ONLY)
Abs Immature Granulocytes: 0.01 10*3/uL (ref 0.00–0.07)
Basophils Absolute: 0 10*3/uL (ref 0.0–0.1)
Basophils Relative: 1 %
Eosinophils Absolute: 0.1 10*3/uL (ref 0.0–0.5)
Eosinophils Relative: 2 %
HCT: 36.7 % (ref 36.0–46.0)
Hemoglobin: 12.3 g/dL (ref 12.0–15.0)
Immature Granulocytes: 0 %
Lymphocytes Relative: 13 %
Lymphs Abs: 0.4 10*3/uL — ABNORMAL LOW (ref 0.7–4.0)
MCH: 33.7 pg (ref 26.0–34.0)
MCHC: 33.5 g/dL (ref 30.0–36.0)
MCV: 100.5 fL — ABNORMAL HIGH (ref 80.0–100.0)
Monocytes Absolute: 0.5 10*3/uL (ref 0.1–1.0)
Monocytes Relative: 16 %
Neutro Abs: 2.2 10*3/uL (ref 1.7–7.7)
Neutrophils Relative %: 68 %
Platelet Count: 209 10*3/uL (ref 150–400)
RBC: 3.65 MIL/uL — ABNORMAL LOW (ref 3.87–5.11)
RDW: 17.3 % — ABNORMAL HIGH (ref 11.5–15.5)
WBC Count: 3.2 10*3/uL — ABNORMAL LOW (ref 4.0–10.5)
nRBC: 0 % (ref 0.0–0.2)

## 2023-05-25 LAB — CMP (CANCER CENTER ONLY)
ALT: 13 U/L (ref 0–44)
AST: 54 U/L — ABNORMAL HIGH (ref 15–41)
Albumin: 3.7 g/dL (ref 3.5–5.0)
Alkaline Phosphatase: 109 U/L (ref 38–126)
Anion gap: 5 (ref 5–15)
BUN: 14 mg/dL (ref 8–23)
CO2: 26 mmol/L (ref 22–32)
Calcium: 8.6 mg/dL — ABNORMAL LOW (ref 8.9–10.3)
Chloride: 106 mmol/L (ref 98–111)
Creatinine: 0.6 mg/dL (ref 0.44–1.00)
GFR, Estimated: >60 mL/min (ref >60)
Glucose, Bld: 109 mg/dL — ABNORMAL HIGH (ref 70–99)
Potassium: 3.9 mmol/L (ref 3.5–5.1)
Sodium: 137 mmol/L (ref 135–145)
Total Bilirubin: 0.4 mg/dL (ref 0.0–1.2)
Total Protein: 6.2 g/dL — ABNORMAL LOW (ref 6.5–8.1)

## 2023-05-25 MED ORDER — SODIUM CHLORIDE 0.9% FLUSH
10.0000 mL | INTRAVENOUS | Status: DC | PRN
  Administered 2023-05-25: 10 mL

## 2023-05-25 MED ORDER — ACETAMINOPHEN 325 MG PO TABS
650.0000 mg | ORAL_TABLET | Freq: Once | ORAL | Status: AC
  Administered 2023-05-25: 650 mg via ORAL
  Filled 2023-05-25: qty 2

## 2023-05-25 MED ORDER — DEXTROSE 5 % IV SOLN
3.2000 mg/kg | Freq: Once | INTRAVENOUS | Status: AC
  Administered 2023-05-25: 200 mg via INTRAVENOUS
  Filled 2023-05-25: qty 10

## 2023-05-25 MED ORDER — DEXTROSE 5 % IV SOLN: Freq: Once | INTRAVENOUS | Status: AC

## 2023-05-25 MED ORDER — DEXAMETHASONE SODIUM PHOSPHATE 10 MG/ML IJ SOLN
10.0000 mg | Freq: Once | INTRAMUSCULAR | Status: AC
  Administered 2023-05-25: 10 mg via INTRAVENOUS
  Filled 2023-05-25: qty 1

## 2023-05-25 MED ORDER — SODIUM CHLORIDE 0.9% FLUSH
10.0000 mL | Freq: Once | INTRAVENOUS | Status: AC
  Administered 2023-05-25: 10 mL

## 2023-05-25 MED ORDER — DIPHENHYDRAMINE HCL 25 MG PO CAPS
50.0000 mg | ORAL_CAPSULE | Freq: Once | ORAL | Status: AC
  Administered 2023-05-25: 50 mg via ORAL
  Filled 2023-05-25: qty 2

## 2023-05-25 MED ORDER — SODIUM CHLORIDE 0.9 % IV SOLN
150.0000 mg | Freq: Once | INTRAVENOUS | Status: AC
  Administered 2023-05-25: 150 mg via INTRAVENOUS
  Filled 2023-05-25: qty 150

## 2023-05-25 MED ORDER — PALONOSETRON HCL INJECTION 0.25 MG/5ML
0.2500 mg | Freq: Once | INTRAVENOUS | Status: AC
Start: 2023-05-25 — End: 2023-05-25
  Administered 2023-05-25: 0.25 mg via INTRAVENOUS
  Filled 2023-05-25: qty 5

## 2023-05-25 MED ORDER — HEPARIN SOD (PORK) LOCK FLUSH 100 UNIT/ML IV SOLN
500.0000 [IU] | Freq: Once | INTRAVENOUS | Status: AC | PRN
  Administered 2023-05-25: 500 [IU]

## 2023-05-25 NOTE — Progress Notes (Signed)
 Patient Care Team: Ollen Bowl, MD as PCP - General (Internal Medicine) [name], MD as Consulting Physician (Hematology and Oncology) Almond Lint, MD as Consulting Physician (General Surgery) Dorothy Puffer, MD as Consulting Physician (Radiation Oncology) Carmina Miller, MD as Consulting Physician (Radiation Oncology)  DIAGNOSIS:  Encounter Diagnosis  Name Primary?   Malignant neoplasm of lower-outer quadrant of left breast of female, estrogen receptor positive (HCC) Yes    SUMMARY OF ONCOLOGIC HISTORY: Oncology History  Malignant neoplasm of lower-outer quadrant of left breast of female, estrogen receptor positive (HCC)  06/11/2016 Mammogram   Palpable left breast masses 3:00 position: 2.2 cm; 5:30 position: 2.5 cm; 6:30 position: 0.7 cm   06/19/2016 Initial Diagnosis   Left breast biopsy 3:30: IDC with DCIS grade 1, ER 90%, PR 50%, Ki-67 15%, HER-2 negative ratio 1.13; biopsy 5:30 position: IDC grade 1   07/13/2016 Breast MRI   Large area of abnormal enhancement lower inner and lower outer quadrants left breast spanning 9 cm x 6.4 cm x 5.3 cm, no abnormal enlarged lymph nodes; T3 N0 stage II a (New AJCC staging)    07/15/2016 - 12/08/2017 Anti-estrogen oral therapy   Neoadjuvant anastrozole 1 mg daily   07/17/2016 Oncotype testing   Testing done on the biopsy: Oncotype DX score 22, intermediate risk   02/02/2017 Breast MRI   Left breast multicentric disease unchanged measuring 2.7 x 1.6 cm.  Mass in the non-mass enhancement are also not significantly changed measuring 6.2 x 2.4 cm. new enhancing mass within the outer right breast 7 mm which could be fat necrosis or inclusion cyst    02/09/2017 Imaging   Ultrasound of the right breast lesion noted on MRI: No sonographic finding corresponds to the abnormality noted on MRI   07/13/2017 Cancer Staging   Staging form: Breast, AJCC 8th Edition - Clinical stage from 07/13/2017: Stage IIA (cT3, cN0, cM0, G1, ER+, PR+, HER2-) -  Signed by Loa Socks, NP on 05/18/2018   10/26/2017 Surgery   Left mastectomy: IDC grade 1, 2 foci largest spans 8.5 cm, intermediate grade DCIS, lymphovascular invasion identified, perineural invasion identified, 1/2 lymph nodes positive with extracapsular extension, ER 9200%, PR 5 to 50%, HER-2 negative, Ki-67 10 to 15%, T3N1A Mammaprint: low risk   11/02/2017 Cancer Staging   Staging form: Breast, AJCC 8th Edition - Pathologic: No Stage Recommended (ypT3, pN1a, cM0, G1, ER+, PR+, HER2-) - Signed by [name], MD on 11/02/2017   12/08/2017 - 01/26/2018 Radiation Therapy   Adjuvant radiation therapy    02/2018 -  Anti-estrogen oral therapy   Anastrozole 1 mg daily adjuvant therapy   10/21/2022 -  Chemotherapy   Patient is on Treatment Plan : BREAST METASTATIC Fam-Trastuzumab Deruxtecan-nxki (Enhertu) (5.4) q21d       CHIEF COMPLIANT: Follow-up on Enhertu  HISTORY OF PRESENT ILLNESS:   History of Present Illness The patient, with a history of bone metastases and liver lesions, presents for a follow-up after recent imaging studies. She reports improvement in her voice and denies any new symptoms. She expresses a desire to resume physical therapy, which was discontinued in January due to insurance issues. She found physical therapy beneficial and misses it.  The patient also mentions a mild pain in her shoulder, which she attributes to straining while using low toilets during a recent trip. She denies any back pain, despite having compression fractures in her back. She also mentions some skin lesions on her back, which have been present for a while.  The patient's tremors have improved significantly, and she reports no other new symptoms or concerns. She is currently on a regimen of B12 supplements and has a slightly elevated glucose level, which she attributes to her diet.     ALLERGIES:  is allergic to percocet [oxycodone-acetaminophen].  MEDICATIONS:  Current  Outpatient Medications  Medication Sig Dispense Refill   Calcium 500-100 MG-UNIT CHEW Chew 1 tablet by mouth daily. 60 tablet    cholecalciferol (VITAMIN D3) 25 MCG (1000 UNIT) tablet Take 1 tablet (1,000 Units total) by mouth daily.     furosemide (LASIX) 20 MG tablet Take 1 tablet (20 mg total) by mouth daily. If needed may take 1/2 tab q am 30 tablet 0   levothyroxine (SYNTHROID) 125 MCG tablet Take 1 tablet (125 mcg total) by mouth daily before breakfast. 90 tablet 0   lidocaine-prilocaine (EMLA) cream Apply 1 Application topically as needed. Apply one hour prior to port a cath access 30 g 1   omeprazole (PRILOSEC) 20 MG capsule TAKE 1 CAPSULE BY MOUTH ONCE DAILY 30 capsule 3   vitamin C (ASCORBIC ACID) 250 MG tablet Take 1 tablet (250 mg total) by mouth daily.     azelastine (ASTELIN) 0.1 % nasal spray Place into both nostrils 2 (two) times daily. (Patient not taking: Reported on 05/25/2023)     dexamethasone (DECADRON) 4 MG tablet Take 1 tab for 2 days after chemo (Patient not taking: Reported on 05/25/2023) 30 tablet 1   LORazepam (ATIVAN) 0.5 MG tablet Take 1 tablet (0.5 mg total) by mouth at bedtime. (Patient not taking: Reported on 05/25/2023) 30 tablet 0   ondansetron (ZOFRAN) 8 MG tablet Take 1 tablet (8 mg total) by mouth every 8 (eight) hours as needed for nausea or vomiting. Start on the third day after chemotherapy. (Patient not taking: Reported on 05/25/2023) 30 tablet 1   pantoprazole (PROTONIX) 40 MG tablet 1 tablet Orally Once a day for 30 days Take 1 tablet twice daily for 2 weeks then take 1 tablet daily. (Patient not taking: Reported on 05/25/2023)     prochlorperazine (COMPAZINE) 10 MG tablet Take 1 tablet (10 mg total) by mouth every 6 (six) hours as needed for nausea or vomiting. (Patient not taking: Reported on 05/25/2023) 30 tablet 1   No current facility-administered medications for this visit.   Facility-Administered Medications Ordered in Other Visits  Medication Dose  Route Frequency Provider Last Rate Last Admin   fam-trastuzumab deruxtecan-nxki (ENHERTU) 200 mg in dextrose 5 % 100 mL chemo infusion  3.2 mg/kg (Treatment Plan Recorded) Intravenous Once [name], MD       heparin lock flush 100 unit/mL  500 Units Intracatheter Once PRN [name], MD       sodium chloride flush (NS) 0.9 % injection 10 mL  10 mL Intracatheter PRN [name], MD        PHYSICAL EXAMINATION: ECOG PERFORMANCE STATUS: 1 - Symptomatic but completely ambulatory      Vitals:   05/25/23 1456  BP: 126/89  Resp: 18  Temp: 98.5 F (36.9 C)  SpO2: 100%   Filed Weights   05/25/23 1456  Weight: 154 lb 9.6 oz (70.1 kg)    Physical Exam   (exam performed in the presence of a chaperone)  LABORATORY DATA:  I have reviewed the data as listed    Latest Ref Rng & Units 05/25/2023    2:31 PM 05/04/2023   12:55 PM 04/13/2023    1:45 PM  CMP  Glucose 70 - 99 mg/dL 409  811  75   BUN 8 - 23 mg/dL 14  16  14    Creatinine 0.44 - 1.00 mg/dL 9.14  7.82  9.56   Sodium 135 - 145 mmol/L 137  138  139   Potassium 3.5 - 5.1 mmol/L 3.9  3.8  4.2   Chloride 98 - 111 mmol/L 106  107  108   CO2 22 - 32 mmol/L 26  25  26    Calcium 8.9 - 10.3 mg/dL 8.6  9.0  9.6   Total Protein 6.5 - 8.1 g/dL 6.2  6.1  6.3   Total Bilirubin 0.0 - 1.2 mg/dL 0.4  0.3  0.4   Alkaline Phos 38 - 126 U/L 109  102  95   AST 15 - 41 U/L 54  36  32   ALT 0 - 44 U/L 13  13  10      Lab Results  Component Value Date   WBC 3.2 (L) 05/25/2023   HGB 12.3 05/25/2023   HCT 36.7 05/25/2023   MCV 100.5 (H) 05/25/2023   PLT 209 05/25/2023   NEUTROABS 2.2 05/25/2023    ASSESSMENT & PLAN:  Malignant neoplasm of lower-outer quadrant of left breast of female, estrogen receptor positive (HCC) 10/26/17: Left mastectomy: IDC grade 1, 2 foci largest spans 8.5 cm, intermediate grade DCIS, lymphovascular invasion identified, perineural invasion identified, 1/2 lymph nodes positive with extracapsular extension,  ER 9200%, PR 5 to 50%, HER-2 negative, Ki-67 10 to 15%, T3N1A   Oncotype DX score 22, intermediate risk, chemotherapy not felt to have significant benefit.   Treatment Summary: 1. Antiestrogen therapy with anastrozole 1 mg daily started 07/15/2016 2. Mastectomy 10/26/2017, Mammaprint low risk luminal type A 3. Followed by adjuvant radiation 12/08/17- 01/26/18  4. Followed by adjuvant antiestrogen therapy anastrozole started 01/17/2018 (originally started 07/15/2016) -------------------------------------------------------------------- Low back pain August 2023: Underwent CT myelogram: Large expansile lesion in the sacrum with extraosseous extension of the tumor, diffuse lytic lesions throughout the visualized spine with metastatic disease myeloma is considered less likely.  (This was ordered by Dr. Marcene Corning)   Treatment plan: 1.  PET CT scan 12/27/2021: Widespread bone metastatic disease largest lesion involving the sacrum with pathological fractures of T6 and T10 retroperitoneal and pelvic lymph node metastasis, right axillary lymph node, activity in the pancreatic head, hypermetabolic activity in the adrenal glands, right thyroid nodule. 2. biopsy of sacrum: 12/26/2021: Metastatic breast cancer, ER 90%, PR 10%, HER2 negative (0) 3.  Ibrance along with Faslodex started 12/25/2021-10/14/2022 4.  Xgeva for bone metastases.  Every 3 months 5.  Palliative radiation to the sacrum completed 01/19/2022 ---------------------------------------------------------------------------------------------------------------------- Current treatment: Enhertu started 10/21/2022, today is cycle 9 We plan to continue this treatment irrespective of her blood counts.   Enhertu toxicities: Tolerated extremely well without any problems. Completed radiation   Liver function tests have started to improve.   PET/CT 10/29/2022: Overall improved appearance of bone lesions.  Stable right extra lymph node, retroperitoneal  lymph nodes decreased activity CT head ordered by Dr. 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  Unchanged innumerable scalp nodules CT CAP 02/24/2023: Liver lesions diminished in size sclerosis of widespread bone metastases CT CAP 05/21/2023: Extensive bone metastases similar, low-attenuation liver lesion not clearly seen previously (liver MRI suggested)   Overall this is continued excellent response to treatment.  Vocal Cord Dysfunction: ENT consult and underwent speech therapy with improvement noted.   Return to clinic every 3 weeks for Enhertu treatment. 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 New 1.1 cm liver lesion identified. No significant pain reported. Blood work shows normal hemoglobin and platelets, slightly low white blood cell count, normal kidney and liver function, mildly elevated glucose. - Restart physical therapy with a new script to potentially extend insurance coverage. - Monitor liver lesion; no MRI required at this time. - Continue current management and monitoring of bone metastases. - Educated on kyphoplasty as a potential future option for compression fractures if symptoms develop. - Schedule follow-up appointment in a few weeks.  Vocal cord dysfunction Voice improved and louder, indicating progress. - Restart physical therapy with a new script to potentially extend insurance coverage.      No orders of the defined types were placed in this encounter.  The patient has a good understanding of the overall plan. she agrees with it. she will call with any problems that may develop before the next visit  here. Total time spent: 30 mins including face to face time and time spent for planning, charting and co-ordination of care   Tamsen Meek, MD 05/25/23

## 2023-05-25 NOTE — Patient Instructions (Signed)

## 2023-05-25 NOTE — Assessment & Plan Note (Signed)
 10/26/17: Left mastectomy: IDC grade 1, 2 foci largest spans 8.5 cm, intermediate grade DCIS, lymphovascular invasion identified, perineural invasion identified, 1/2 lymph nodes positive with extracapsular extension, ER 9200%, PR 5 to 50%, HER-2 negative, Ki-67 10 to 15%, T3N1A   Oncotype DX score 22, intermediate risk, chemotherapy not felt to have significant benefit.   Treatment Summary: 1. Antiestrogen therapy with anastrozole 1 mg daily started 07/15/2016 2. Mastectomy 10/26/2017, Mammaprint low risk luminal type A 3. Followed by adjuvant radiation 12/08/17- 01/26/18  4. Followed by adjuvant antiestrogen therapy anastrozole started 01/17/2018 (originally started 07/15/2016) -------------------------------------------------------------------- Low back pain August 2023: Underwent CT myelogram: Large expansile lesion in the sacrum with extraosseous extension of the tumor, diffuse lytic lesions throughout the visualized spine with metastatic disease myeloma is considered less likely.  (This was ordered by Dr. Marcene Corning)   Treatment plan: 1.  PET CT scan 12/27/2021: Widespread bone metastatic disease largest lesion involving the sacrum with pathological fractures of T6 and T10 retroperitoneal and pelvic lymph node metastasis, right axillary lymph node, activity in the pancreatic head, hypermetabolic activity in the adrenal glands, right thyroid nodule. 2. biopsy of sacrum: 12/26/2021: Metastatic breast cancer, ER 90%, PR 10%, HER2 negative (0) 3.  Ibrance along with Faslodex started 12/25/2021-10/14/2022 4.  Xgeva for bone metastases.  Every 3 months 5.  Palliative radiation to the sacrum completed 01/19/2022 ---------------------------------------------------------------------------------------------------------------------- Current treatment: Enhertu started 10/21/2022, today is cycle 9 We plan to continue this treatment irrespective of her blood counts.   Enhertu toxicities: Tolerated extremely  well without any problems. Completed radiation   Liver function tests have started to improve.   PET/CT 10/29/2022: Overall improved appearance of bone lesions.  Stable right extra lymph node, retroperitoneal lymph nodes decreased activity CT head ordered by Dr. 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  Unchanged innumerable scalp nodules CT CAP 02/24/2023: Liver lesions diminished in size sclerosis of widespread bone metastases CT CAP 05/21/2023: Extensive bone metastases similar, low-attenuation liver lesion not clearly seen previously (liver MRI suggested)   Overall this is continued excellent response to treatment.   Vocal Cord Dysfunction: ENT consult and underwent speech therapy with improvement noted.   Return to clinic every 3 weeks for Enhertu treatment.

## 2023-05-26 ENCOUNTER — Encounter: Payer: Self-pay | Admitting: Hematology and Oncology

## 2023-05-26 ENCOUNTER — Other Ambulatory Visit: Payer: Self-pay | Admitting: *Deleted

## 2023-05-26 DIAGNOSIS — C799 Secondary malignant neoplasm of unspecified site: Secondary | ICD-10-CM

## 2023-05-26 DIAGNOSIS — Z170 Estrogen receptor positive status [ER+]: Secondary | ICD-10-CM

## 2023-05-26 LAB — TSH: TSH: 2.575 u[IU]/mL (ref 0.350–4.500)

## 2023-05-26 NOTE — Progress Notes (Signed)
 Received mychart message from pt requesting PT orders be placed for pt to continue physical therapy at [facility].  RN reviewed with MD and verbal orders received and placed.

## 2023-05-27 ENCOUNTER — Encounter: Payer: Self-pay | Admitting: Hematology and Oncology

## 2023-05-31 ENCOUNTER — Ambulatory Visit: Payer: Medicaid Other

## 2023-05-31 ENCOUNTER — Encounter: Payer: Self-pay | Admitting: Hematology and Oncology

## 2023-05-31 DIAGNOSIS — R490 Dysphonia: Secondary | ICD-10-CM

## 2023-05-31 NOTE — Therapy (Signed)
 OUTPATIENT SPEECH LANGUAGE PATHOLOGY  VOICE TREATMENT / PROGRESS NOTE   Patient Name: Kathryn Lucas MRN: [id number] DOB:1960-11-15, 63 y.o., female Today's Date: 05/31/2023  PCP: Jacqulyn Bath, MD  REFERRING PROVIDER: Bonney Roussel, [name]  Speech Therapy Progress Note  Dates of Reporting Period: 03/31/23 to 05/31/23  Objective: Patient has been seen for 10 speech therapy sessions this reporting period targeting dysphonia. Patient is making progress toward LTGs and met 3/6 STGs this reporting period. See skilled intervention, clinical impressions, and goals below for details.    End of Session - 05/31/23 1629     Visit Number 10    Number of Visits 24    Date for SLP Re-Evaluation 06/23/23    Authorization Type carelon order#[id number]    Authorization Time Period 3/17-5/15 for 6 SLP visits    Authorization - Visit Number 1    Authorization - Number of Visits 6    Progress Note Due on Visit 20    SLP Start Time 1445    SLP Stop Time  1530    SLP Time Calculation (min) 45 min    Activity Tolerance Patient tolerated treatment well             Past Medical History:  Diagnosis Date   Breast cancer (HCC)    left breast cancer   Cancer (HCC) 09/2017   left breast cancer   Family history of breast cancer    Hypothyroidism    Personal history of radiation therapy 2019   Thyroid disease    Past Surgical History:  Procedure Laterality Date   BREAST BIOPSY Left 2018   BREAST BIOPSY Left 2019   BREAST RECONSTRUCTION WITH PLACEMENT OF TISSUE EXPANDER AND ALLODERM Left 10/26/2017   Procedure: LEFT BREAST RECONSTRUCTION WITH PLACEMENT OF TISSUE EXPANDER AND ALLODERM;  Surgeon: Glenna Fellows, MD;  Location: [facility];  Service: Plastics;  Laterality: Left;   IR IMAGING GUIDED PORT INSERTION  04/27/2023   MASTECTOMY Left 2019   MASTECTOMY WITH RADIOACTIVE SEED GUIDED EXCISION AND AXILLARY SENTINEL LYMPH NODE BIOPSY Left 10/26/2017   Procedure: LEFT MASTECTOMY WITH SEED  TARGETED  LEFT AXILLARY LYMPH NODE EXCISION AND LEFT SENTINEL LYMPH NODE BIOPSY;  Surgeon: Almond Lint, MD;  Location: [facility];  Service: General;  Laterality: Left;   TONSILLECTOMY     WISDOM TOOTH EXTRACTION     Patient Active Problem List   Diagnosis Date Noted   Metastatic 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) 10/14/2022   Elevated liver enzymes 10/14/2022   Unexplained weight loss 10/14/2022   Neoplasm related pain 10/14/2022   Metastatic malignant neoplasm (HCC) 12/19/2021   Family history of breast cancer    History of therapeutic radiation 04/27/2018   Breast cancer, left breast (HCC) 10/26/2017   Malignant neoplasm of lower-outer quadrant of left breast of female, estrogen receptor positive (HCC) 07/15/2016   Plantar fasciitis 07/30/2015   Metatarsalgia 10/18/2014    ONSET DATE:  03/29/23 (referral); since July 2024 per pt  REFERRING DIAG: Hoarseness  THERAPY DIAG:  Dysphonia  Hoarseness  Rationale for Evaluation and Treatment Rehabilitation  SUBJECTIVE:   SUBJECTIVE STATEMENT: Pt alert, pleasant, and cooperative. Dysphonia apparent. Pt accompanied by: self  PERTINENT HISTORY: 63 y.o. female who presents for voice evaluation. 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 On endoscopy, 1/13, pt with weakness and bowing of B vocal cords with more weakness on L than R. Pt referred for course ST prior to consideration laryngologist  referral per ENT note.  DIAGNOSTIC FINDINGS: 02/18/23, CT Chest Abdomen Pelvis, "1. Hypodense lesion of the medial liver dome, in retrospective review diminished in size compared to prior PET-CT examination. Findings are consistent with treatment response of hepatic metastatic disease. 2. Slightly increased sclerosis of widespread mixed lytic and sclerotic osseous metastatic disease throughout the axial skeleton, consistent with treatment response. 3. Unchanged post treatment/post  radiation appearance of the right chest, with perihilar fibrosis and consolidation. 4. Status post left mastectomy with saline spacer."  PAIN:  Are you having pain? No (FACES)   FALLS: defer to PT  LIVING ENVIRONMENT: Lives with: lives alone   PLOF: Independent  PATIENT GOALS    for voice to be stronger  OBJECTIVE:  TODAY'S TREATMENT: Reviewed resonant voice therapy. Pt completed at the word, phrase, and sentence level with min/mod cueing. Notable improvement in vocal quality. Pt identified improved vocal quality when utilizing "low" and "loud" voice. At the conversation level, pt averaged 77dB during 15 minutes of conversation. Mild vocal hoarseness appreciated. Pt with emerging use of forward tone/resonant voice at the conversation level. Reviewed effects of fatigue, strain, GERD, and seasonal allergies on voice.   PATIENT EDUCATION: Education details: HEP, POC Person educated: Patient Education method: Explanation; Handout Education comprehension: verbalized understanding   HOME EXERCISE PROGRAM: Phonation Resistance Ex's Resonant voice therapy handout      GOALS: Goals reviewed with patient? Yes  SHORT TERM GOALS: Target date: 10 sessions  Pt will improve water intake to 5-8 glasses per day per pt report. Baseline: Goal status: MET  2.  The patient will demonstrate abdominal breathing patterns and steady release of breath on exhalation to optimize efficiency of voicing and decrease laryngeal hyperfunction with min cueing.  Baseline:  Goal status: PROGRESSING  3.  The patient will eliminate phonotraumatic behaviors such as chronic throat clearing, by substituting non-traumatic methods to clear mucus with min cueing.  Baseline:  Goal status: PROGRESSING  4.  The patient will utilize a forward tone focused/resonant voice to decrease vocal hyperfunction and improve voice quality and vocal projection with min cueing. Baseline:  Goal status: PROGRESSING  5.   Pt will perform HEP for voice with min cueing. Baseline:  Goal status: MET  6.  The patient will participate in 5-8 minutes conversation, maintaining average loudness of 75 dB and loud, good quality voice with min cues.     Baseline:  Goal status: MET  LONG TERM GOALS: Target date: 12 weeks  Pt will improve score on the VHI by 10% indicating improved perception of speech abilities. Baseline: 42/120 Goal status: PROGRESSING  2.  The patient will demonstrate abdominal breathing patterns and steady release of breath on exhalation to optimize efficiency of voicing and decrease laryngeal hyperfunction.  Baseline:  Goal status: PROGRESSING    3.  Pt will perform HEP for voice independently. Baseline:  Goal status: MET  4.  The patient will participate in 15-20 minutes conversation, maintaining average loudness of 75 dB and loud, good quality voice with min cues.     Baseline:  Goal status: PROGRESSING  ASSESSMENT:  CLINICAL IMPRESSION: Patient is a 63 y.o. female who presents for voice treatment. 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 On endoscopy, 03/29/23, pt with weakness and bowing of B vocal cords with more weakness on L than R. Pt referred for course ST prior to consideration laryngologist referral per ENT note. Pt's voice with perceptual characteristics of breathiness, hoarseness,  volume decay, and diplophonia. Pt with tendency to utilize clavicular breathing. Pt with stimulable to resonant voice therapy and clear speech therapy. Pt reports dissatisfactoin with voice particularly in noisy environments or instances when she needs to project (e.g. bible study). See details of today's treatment above. Recommend course of speech therapy to improve pt's voice in social settings and for ADLs/iADLs.  OBJECTIVE IMPAIRMENTS include voice disorder. These impairments are limiting patient from effectively communicating at home and in  community. Factors affecting potential to achieve goals and functional outcome are  overall medical status/comorbidities . Patient will benefit from skilled SLP services to address above impairments and improve overall function.  REHAB POTENTIAL: Good  PLAN: SLP FREQUENCY: 1-2x/week  SLP DURATION: 12 weeks  PLANNED INTERVENTIONS: Cueing hierachy, Internal/external aids, Functional tasks, Multimodal communication approach, SLP instruction and feedback, Compensatory strategies, and Patient/family education    Clyde Canterbury, M.S., CCC-SLP Speech-Language Pathologist [place] [facility] [phone] Arnette Felts)  [facility] at [facility] [address] [place], [place], [address] Phone: [phone]   Fax:  [phone]

## 2023-06-02 ENCOUNTER — Encounter: Payer: Self-pay | Admitting: Hematology and Oncology

## 2023-06-07 ENCOUNTER — Encounter: Payer: Self-pay | Admitting: Hematology and Oncology

## 2023-06-07 ENCOUNTER — Ambulatory Visit: Payer: Medicaid Other

## 2023-06-07 DIAGNOSIS — R490 Dysphonia: Secondary | ICD-10-CM

## 2023-06-07 NOTE — Therapy (Signed)
 OUTPATIENT SPEECH LANGUAGE PATHOLOGY  VOICE TREATMENT / PROGRESS NOTE   Patient Name: Kathryn Lucas MRN: [id number] DOB:1960-09-30, 63 y.o., female Today's Date: 06/07/2023  PCP: Jacqulyn Bath, MD  REFERRING PROVIDER: Nicole Cella, [name]   End of Session - 06/07/23 1127     Visit Number 11    Number of Visits 24    Date for SLP Re-Evaluation 06/23/23    Authorization Type carelon order#[id number]    Authorization Time Period 3/17-5/15 for 6 SLP visits    Authorization - Visit Number 2    Authorization - Number of Visits 6    Progress Note Due on Visit 20    SLP Start Time 1020    SLP Stop Time  1110    SLP Time Calculation (min) 50 min    Activity Tolerance Patient tolerated treatment well             Past Medical History:  Diagnosis Date   Breast cancer (HCC)    left breast cancer   Cancer (HCC) 09/2017   left breast cancer   Family history of breast cancer    Hypothyroidism    Personal history of radiation therapy 2019   Thyroid disease    Past Surgical History:  Procedure Laterality Date   BREAST BIOPSY Left 2018   BREAST BIOPSY Left 2019   BREAST RECONSTRUCTION WITH PLACEMENT OF TISSUE EXPANDER AND ALLODERM Left 10/26/2017   Procedure: LEFT BREAST RECONSTRUCTION WITH PLACEMENT OF TISSUE EXPANDER AND ALLODERM;  Surgeon: Glenna Fellows, MD;  Location: [facility];  Service: Plastics;  Laterality: Left;   IR IMAGING GUIDED PORT INSERTION  04/27/2023   MASTECTOMY Left 2019   MASTECTOMY WITH RADIOACTIVE SEED GUIDED EXCISION AND AXILLARY SENTINEL LYMPH NODE BIOPSY Left 10/26/2017   Procedure: LEFT MASTECTOMY WITH SEED TARGETED  LEFT AXILLARY LYMPH NODE EXCISION AND LEFT SENTINEL LYMPH NODE BIOPSY;  Surgeon: Almond Lint, MD;  Location: [facility];  Service: General;  Laterality: Left;   TONSILLECTOMY     WISDOM TOOTH EXTRACTION     Patient Active Problem List   Diagnosis Date Noted   Metastatic 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) 10/14/2022   Elevated  liver enzymes 10/14/2022   Unexplained weight loss 10/14/2022   Neoplasm related pain 10/14/2022   Metastatic malignant neoplasm (HCC) 12/19/2021   Family history of breast cancer    History of therapeutic radiation 04/27/2018   Breast cancer, left breast (HCC) 10/26/2017   Malignant neoplasm of lower-outer quadrant of left breast of female, estrogen receptor positive (HCC) 07/15/2016   Plantar fasciitis 07/30/2015   Metatarsalgia 10/18/2014    ONSET DATE:  03/29/23 (referral); since July 2024 per pt  REFERRING DIAG: Hoarseness  THERAPY DIAG:  Dysphonia  Hoarseness  Rationale for Evaluation and Treatment Rehabilitation  SUBJECTIVE:   SUBJECTIVE STATEMENT: Pt alert, pleasant, and cooperative. Dysphonia apparent. Pt accompanied by: self  PERTINENT HISTORY: 63 y.o. female who presents for voice evaluation. 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 On endoscopy, 1/13, pt with weakness and bowing of B vocal cords with more weakness on L than R. Pt referred for course ST prior to consideration laryngologist referral per ENT note.  DIAGNOSTIC FINDINGS: 02/18/23, CT Chest Abdomen Pelvis, "1. Hypodense lesion of the medial liver dome, in retrospective review diminished in size compared to prior PET-CT examination. Findings are consistent with treatment response of hepatic metastatic disease. 2. Slightly increased sclerosis of widespread mixed lytic and sclerotic osseous metastatic  disease throughout the axial skeleton, consistent with treatment response. 3. Unchanged post treatment/post radiation appearance of the right chest, with perihilar fibrosis and consolidation. 4. Status post left mastectomy with saline spacer."  PAIN:  Are you having pain? No (FACES)   FALLS: defer to PT  LIVING ENVIRONMENT: Lives with: lives alone   PLOF: Independent  PATIENT GOALS    for voice to be stronger  OBJECTIVE:  TODAY'S  TREATMENT: Reviewed resonant voice therapy. Pt endorsed completing via HEP. Introduced semi-occluded vocal tract ex's utilizing straw. Educated provided re: purpose and rationale for ex's. Pt completed sustained "ah" ascending/descending pitch glides, and singing ("Happy Birthday") with initial min/mod verbal/visual cueing (including use of tissue to observe exhalation through straw); decreased to rare-min cues. Notable improvement in vocal quality immediately following ex's. At the conversation level, pt averaged 79dB during 10 minutes of conversation. Mild vocal hoarseness appreciated. Pt with emerging use of forward tone/resonant voice at the conversation level. Reviewed effects of fatigue, strain, GERD, and seasonal allergies on voice.   PATIENT EDUCATION: Education details: HEP, POC Person educated: Patient Education method: Explanation; Handout Education comprehension: verbalized understanding   HOME EXERCISE PROGRAM: Phonation Resistance Ex's Resonant voice therapy handout      GOALS: Goals reviewed with patient? Yes  SHORT TERM GOALS: Target date: 10 sessions  Pt will improve water intake to 5-8 glasses per day per pt report. Baseline: Goal status: MET  2.  The patient will demonstrate abdominal breathing patterns and steady release of breath on exhalation to optimize efficiency of voicing and decrease laryngeal hyperfunction with min cueing.  Baseline:  Goal status: PROGRESSING  3.  The patient will eliminate phonotraumatic behaviors such as chronic throat clearing, by substituting non-traumatic methods to clear mucus with min cueing.  Baseline:  Goal status: PROGRESSING  4.  The patient will utilize a forward tone focused/resonant voice to decrease vocal hyperfunction and improve voice quality and vocal projection with min cueing. Baseline:  Goal status: PROGRESSING  5.  Pt will perform HEP for voice with min cueing. Baseline:  Goal status: MET  6.  The patient  will participate in 5-8 minutes conversation, maintaining average loudness of 75 dB and loud, good quality voice with min cues.     Baseline:  Goal status: MET  LONG TERM GOALS: Target date: 12 weeks  Pt will improve score on the VHI by 10% indicating improved perception of speech abilities. Baseline: 42/120 Goal status: PROGRESSING  2.  The patient will demonstrate abdominal breathing patterns and steady release of breath on exhalation to optimize efficiency of voicing and decrease laryngeal hyperfunction.  Baseline:  Goal status: PROGRESSING    3.  Pt will perform HEP for voice independently. Baseline:  Goal status: MET  4.  The patient will participate in 15-20 minutes conversation, maintaining average loudness of 75 dB and loud, good quality voice with min cues.     Baseline:  Goal status: PROGRESSING  ASSESSMENT:  CLINICAL IMPRESSION: Patient is a 63 y.o. female who presents for voice treatment. 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 On endoscopy, 03/29/23, pt with weakness and bowing of B vocal cords with more weakness on L than R. Pt referred for course ST prior to consideration laryngologist referral per ENT note. Pt's voice with perceptual characteristics of breathiness, hoarseness, volume decay, and diplophonia. Pt with tendency to utilize clavicular breathing. Pt with stimulable to resonant voice therapy and clear speech therapy. Pt reports  dissatisfactoin with voice particularly in noisy environments or instances when she needs to project (e.g. bible study). See details of today's treatment above. Recommend course of speech therapy to improve pt's voice in social settings and for ADLs/iADLs.  OBJECTIVE IMPAIRMENTS include voice disorder. These impairments are limiting patient from effectively communicating at home and in community. Factors affecting potential to achieve goals and functional outcome are  overall  medical status/comorbidities . Patient will benefit from skilled SLP services to address above impairments and improve overall function.  REHAB POTENTIAL: Good  PLAN: SLP FREQUENCY: 1-2x/week  SLP DURATION: 12 weeks  PLANNED INTERVENTIONS: Cueing hierachy, Internal/external aids, Functional tasks, Multimodal communication approach, SLP instruction and feedback, Compensatory strategies, and Patient/family education    Clyde Canterbury, M.S., CCC-SLP Speech-Language Pathologist [place] [facility] [phone] Arnette Felts)  [facility] Outpatient Rehabilitation at [facility] [address] [place], [place], [address] Phone: [phone]   Fax:  [phone]

## 2023-06-08 ENCOUNTER — Encounter: Payer: Self-pay | Admitting: Hematology and Oncology

## 2023-06-14 ENCOUNTER — Ambulatory Visit: Payer: Medicaid Other | Admitting: Speech Pathology

## 2023-06-14 ENCOUNTER — Encounter: Payer: Self-pay | Admitting: Hematology and Oncology

## 2023-06-14 ENCOUNTER — Other Ambulatory Visit: Payer: Self-pay | Admitting: General Surgery

## 2023-06-14 DIAGNOSIS — R490 Dysphonia: Secondary | ICD-10-CM

## 2023-06-14 MED FILL — Fosaprepitant Dimeglumine For IV Infusion 150 MG (Base Eq): INTRAVENOUS | Qty: 5 | Status: AC

## 2023-06-14 NOTE — Therapy (Signed)
 OUTPATIENT SPEECH LANGUAGE PATHOLOGY  VOICE TREATMENT / PROGRESS NOTE   Patient Name: Kathryn Lucas MRN: [id number] DOB:Sep 29, 1960, 63 y.o., female Today's Date: 06/14/2023  PCP: Jacqulyn Bath, MD  REFERRING PROVIDER: Nicole Cella, [name]   End of Session - 06/14/23 1538     Visit Number 12    Number of Visits 24    Date for SLP Re-Evaluation 06/23/23    Authorization Type carelon order#[id number]    Authorization Time Period 3/17-5/15 for 6 SLP visits    Authorization - Visit Number 3    Authorization - Number of Visits 6    Progress Note Due on Visit 20    SLP Start Time 1315    SLP Stop Time  1400    SLP Time Calculation (min) 45 min    Activity Tolerance Patient tolerated treatment well             Past Medical History:  Diagnosis Date   Breast cancer (HCC)    left breast cancer   Cancer (HCC) 09/2017   left breast cancer   Family history of breast cancer    Hypothyroidism    Personal history of radiation therapy 2019   Thyroid disease    Past Surgical History:  Procedure Laterality Date   BREAST BIOPSY Left 2018   BREAST BIOPSY Left 2019   BREAST RECONSTRUCTION WITH PLACEMENT OF TISSUE EXPANDER AND ALLODERM Left 10/26/2017   Procedure: LEFT BREAST RECONSTRUCTION WITH PLACEMENT OF TISSUE EXPANDER AND ALLODERM;  Surgeon: Glenna Fellows, MD;  Location: [facility];  Service: Plastics;  Laterality: Left;   IR IMAGING GUIDED PORT INSERTION  04/27/2023   MASTECTOMY Left 2019   MASTECTOMY WITH RADIOACTIVE SEED GUIDED EXCISION AND AXILLARY SENTINEL LYMPH NODE BIOPSY Left 10/26/2017   Procedure: LEFT MASTECTOMY WITH SEED TARGETED  LEFT AXILLARY LYMPH NODE EXCISION AND LEFT SENTINEL LYMPH NODE BIOPSY;  Surgeon: Almond Lint, MD;  Location: [facility];  Service: General;  Laterality: Left;   TONSILLECTOMY     WISDOM TOOTH EXTRACTION     Patient Active Problem List   Diagnosis Date Noted   Metastatic 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) 10/14/2022   Elevated  liver enzymes 10/14/2022   Unexplained weight loss 10/14/2022   Neoplasm related pain 10/14/2022   Metastatic malignant neoplasm (HCC) 12/19/2021   Family history of breast cancer    History of therapeutic radiation 04/27/2018   Breast cancer, left breast (HCC) 10/26/2017   Malignant neoplasm of lower-outer quadrant of left breast of female, estrogen receptor positive (HCC) 07/15/2016   Plantar fasciitis 07/30/2015   Metatarsalgia 10/18/2014    ONSET DATE:  03/29/23 (referral); since July 2024 per pt  REFERRING DIAG: Hoarseness  THERAPY DIAG:  Dysphonia  Rationale for Evaluation and Treatment Rehabilitation  SUBJECTIVE:   SUBJECTIVE STATEMENT: Pt alert, pleasant, and cooperative. Dysphonia apparent. Pt accompanied by: self  PERTINENT HISTORY: 63 y.o. female who presents for voice evaluation. 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 On endoscopy, 1/13, pt with weakness and bowing of B vocal cords with more weakness on L than R. Pt referred for course ST prior to consideration laryngologist referral per ENT note.  DIAGNOSTIC FINDINGS: 02/18/23, CT Chest Abdomen Pelvis, "1. Hypodense lesion of the medial liver dome, in retrospective review diminished in size compared to prior PET-CT examination. Findings are consistent with treatment response of hepatic metastatic disease. 2. Slightly increased sclerosis of widespread mixed lytic and sclerotic osseous metastatic disease throughout  the axial skeleton, consistent with treatment response. 3. Unchanged post treatment/post radiation appearance of the right chest, with perihilar fibrosis and consolidation. 4. Status post left mastectomy with saline spacer."  PAIN:  Are you having pain? No (FACES)   FALLS: defer to PT  LIVING ENVIRONMENT: Lives with: lives alone   PLOF: Independent  PATIENT GOALS    for voice to be stronger  OBJECTIVE:  TODAY'S TREATMENT: Pt  pleasant and cooperative with unfamiliar therapist. Reviewed and executed semi-occluded vocal tract exercises using straw with pt. Pt demonstrated clear voice quality during this task including ascending/descending pitch glides, happy birthday. Pt exhibited mild vocal hoarseness to unfamiliar listener during conversation. She reports completing her exercises daily, and verbalized her continued goal of improvement.            PATIENT EDUCATION: Education details: HEP, POC Person educated: Patient Education method: Explanation; Handout Education comprehension: verbalized understanding   HOME EXERCISE PROGRAM: Phonation Resistance Ex's Resonant voice therapy handout      GOALS: Goals reviewed with patient? Yes  SHORT TERM GOALS: Target date: 10 sessions  Pt will improve water intake to 5-8 glasses per day per pt report. Baseline: Goal status: MET  2.  The patient will demonstrate abdominal breathing patterns and steady release of breath on exhalation to optimize efficiency of voicing and decrease laryngeal hyperfunction with min cueing.  Baseline:  Goal status: PROGRESSING  3.  The patient will eliminate phonotraumatic behaviors such as chronic throat clearing, by substituting non-traumatic methods to clear mucus with min cueing.  Baseline:  Goal status: PROGRESSING  4.  The patient will utilize a forward tone focused/resonant voice to decrease vocal hyperfunction and improve voice quality and vocal projection with min cueing. Baseline:  Goal status: PROGRESSING  5.  Pt will perform HEP for voice with min cueing. Baseline:  Goal status: MET  6.  The patient will participate in 5-8 minutes conversation, maintaining average loudness of 75 dB and loud, good quality voice with min cues.     Baseline:  Goal status: MET  LONG TERM GOALS: Target date: 12 weeks  Pt will improve score on the VHI by 10% indicating improved perception of speech abilities. Baseline: 42/120 Goal  status: PROGRESSING  2.  The patient will demonstrate abdominal breathing patterns and steady release of breath on exhalation to optimize efficiency of voicing and decrease laryngeal hyperfunction.  Baseline:  Goal status: PROGRESSING    3.  Pt will perform HEP for voice independently. Baseline:  Goal status: MET  4.  The patient will participate in 15-20 minutes conversation, maintaining average loudness of 75 dB and loud, good quality voice with min cues.     Baseline:  Goal status: PROGRESSING  ASSESSMENT:  CLINICAL IMPRESSION: Patient is a 63 y.o. female who presents for voice treatment. 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 On endoscopy, 03/29/23, pt with weakness and bowing of B vocal cords with more weakness on L than R. Pt referred for course ST prior to consideration laryngologist referral per ENT note. Pt's voice with perceptual characteristics of breathiness, hoarseness, volume decay, and diplophonia. Pt with tendency to utilize clavicular breathing. Pt with stimulable to resonant voice therapy and clear speech therapy. Pt reports dissatisfactoin with voice particularly in noisy environments or instances when she needs to project (e.g. bible study). See details of today's treatment above. Recommend course of speech therapy to improve pt's voice in social settings and for ADLs/iADLs.  OBJECTIVE IMPAIRMENTS include voice disorder. These impairments are limiting patient from effectively communicating at home and in community. Factors affecting potential to achieve goals and functional outcome are  overall medical status/comorbidities . Patient will benefit from skilled SLP services to address above impairments and improve overall function.  REHAB POTENTIAL: Good  PLAN: SLP FREQUENCY: 1-2x/week  SLP DURATION: 12 weeks  PLANNED INTERVENTIONS: Cueing hierachy, Internal/external aids, Functional tasks, Multimodal  communication approach, SLP instruction and feedback, Compensatory strategies, and Patient/family education    [place] B. Murvin Natal, MSP, CCC-SLP Speech Language Pathologist [place] [facility]  [facility] Out[facility] at [facility] [address] [place], [place], [address] Phone: [phone]   Fax:  [phone]

## 2023-06-15 ENCOUNTER — Encounter: Payer: Self-pay | Admitting: Hematology and Oncology

## 2023-06-15 ENCOUNTER — Inpatient Hospital Stay: Payer: Medicaid Other | Attending: Hematology and Oncology

## 2023-06-15 ENCOUNTER — Inpatient Hospital Stay (HOSPITAL_BASED_OUTPATIENT_CLINIC_OR_DEPARTMENT_OTHER): Payer: Medicaid Other | Admitting: Hematology and Oncology

## 2023-06-15 ENCOUNTER — Inpatient Hospital Stay: Payer: Medicaid Other

## 2023-06-15 VITALS — BP 112/94 | HR 119 | Temp 98.7°F | Resp 18 | Ht 70.0 in | Wt 156.9 lb

## 2023-06-15 DIAGNOSIS — C7951 Secondary malignant neoplasm of bone: Secondary | ICD-10-CM | POA: Insufficient documentation

## 2023-06-15 DIAGNOSIS — Z9012 Acquired absence of left breast and nipple: Secondary | ICD-10-CM | POA: Insufficient documentation

## 2023-06-15 DIAGNOSIS — J383 Other diseases of vocal cords: Secondary | ICD-10-CM | POA: Diagnosis not present

## 2023-06-15 DIAGNOSIS — Z170 Estrogen receptor positive status [ER+]: Secondary | ICD-10-CM | POA: Diagnosis not present

## 2023-06-15 DIAGNOSIS — C50512 Malignant neoplasm of lower-outer quadrant of left female breast: Secondary | ICD-10-CM | POA: Insufficient documentation

## 2023-06-15 DIAGNOSIS — E039 Hypothyroidism, unspecified: Secondary | ICD-10-CM

## 2023-06-15 DIAGNOSIS — M25519 Pain in unspecified shoulder: Secondary | ICD-10-CM | POA: Insufficient documentation

## 2023-06-15 DIAGNOSIS — Z79899 Other long term (current) drug therapy: Secondary | ICD-10-CM | POA: Insufficient documentation

## 2023-06-15 DIAGNOSIS — Z5112 Encounter for antineoplastic immunotherapy: Secondary | ICD-10-CM | POA: Insufficient documentation

## 2023-06-15 DIAGNOSIS — Z79811 Long term (current) use of aromatase inhibitors: Secondary | ICD-10-CM | POA: Insufficient documentation

## 2023-06-15 DIAGNOSIS — Z923 Personal history of irradiation: Secondary | ICD-10-CM | POA: Insufficient documentation

## 2023-06-15 DIAGNOSIS — G629 Polyneuropathy, unspecified: Secondary | ICD-10-CM | POA: Insufficient documentation

## 2023-06-15 DIAGNOSIS — G479 Sleep disorder, unspecified: Secondary | ICD-10-CM | POA: Insufficient documentation

## 2023-06-15 DIAGNOSIS — Z9221 Personal history of antineoplastic chemotherapy: Secondary | ICD-10-CM | POA: Insufficient documentation

## 2023-06-15 DIAGNOSIS — Z5111 Encounter for antineoplastic chemotherapy: Secondary | ICD-10-CM | POA: Diagnosis not present

## 2023-06-15 DIAGNOSIS — C799 Secondary malignant neoplasm of unspecified site: Secondary | ICD-10-CM

## 2023-06-15 LAB — CMP (CANCER CENTER ONLY)
ALT: 13 U/L (ref 0–44)
AST: 71 U/L — ABNORMAL HIGH (ref 15–41)
Albumin: 3.7 g/dL (ref 3.5–5.0)
Alkaline Phosphatase: 105 U/L (ref 38–126)
Anion gap: 5 (ref 5–15)
BUN: 15 mg/dL (ref 8–23)
CO2: 25 mmol/L (ref 22–32)
Calcium: 9 mg/dL (ref 8.9–10.3)
Chloride: 107 mmol/L (ref 98–111)
Creatinine: 0.62 mg/dL (ref 0.44–1.00)
GFR, Estimated: >60 mL/min (ref >60)
Glucose, Bld: 89 mg/dL (ref 70–99)
Potassium: 4.1 mmol/L (ref 3.5–5.1)
Sodium: 137 mmol/L (ref 135–145)
Total Bilirubin: 0.4 mg/dL (ref 0.0–1.2)
Total Protein: 6.3 g/dL — ABNORMAL LOW (ref 6.5–8.1)

## 2023-06-15 LAB — CBC WITH DIFFERENTIAL (CANCER CENTER ONLY)
Abs Immature Granulocytes: 0.01 10*3/uL (ref 0.00–0.07)
Basophils Absolute: 0 10*3/uL (ref 0.0–0.1)
Basophils Relative: 1 %
Eosinophils Absolute: 0.1 10*3/uL (ref 0.0–0.5)
Eosinophils Relative: 2 %
HCT: 36.2 % (ref 36.0–46.0)
Hemoglobin: 11.8 g/dL — ABNORMAL LOW (ref 12.0–15.0)
Immature Granulocytes: 0 %
Lymphocytes Relative: 13 %
Lymphs Abs: 0.5 10*3/uL — ABNORMAL LOW (ref 0.7–4.0)
MCH: 33.1 pg (ref 26.0–34.0)
MCHC: 32.6 g/dL (ref 30.0–36.0)
MCV: 101.7 fL — ABNORMAL HIGH (ref 80.0–100.0)
Monocytes Absolute: 0.5 10*3/uL (ref 0.1–1.0)
Monocytes Relative: 16 %
Neutro Abs: 2.3 10*3/uL (ref 1.7–7.7)
Neutrophils Relative %: 68 %
Platelet Count: 195 10*3/uL (ref 150–400)
RBC: 3.56 MIL/uL — ABNORMAL LOW (ref 3.87–5.11)
RDW: 16.7 % — ABNORMAL HIGH (ref 11.5–15.5)
WBC Count: 3.4 10*3/uL — ABNORMAL LOW (ref 4.0–10.5)
nRBC: 0 % (ref 0.0–0.2)

## 2023-06-15 LAB — T4, FREE: Free T4: 1.08 ng/dL (ref 0.61–1.12)

## 2023-06-15 LAB — TSH: TSH: 2.207 u[IU]/mL (ref 0.350–4.500)

## 2023-06-15 MED ORDER — DEXAMETHASONE SODIUM PHOSPHATE 10 MG/ML IJ SOLN
10.0000 mg | Freq: Once | INTRAMUSCULAR | Status: AC
Start: 2023-06-15 — End: 2023-06-15
  Administered 2023-06-15: 10 mg via INTRAVENOUS

## 2023-06-15 MED ORDER — FAM-TRASTUZUMAB DERUXTECAN-NXKI CHEMO 100 MG IV SOLR
3.2000 mg/kg | Freq: Once | INTRAVENOUS | Status: AC
  Administered 2023-06-15: 200 mg via INTRAVENOUS
  Filled 2023-06-15: qty 10

## 2023-06-15 MED ORDER — DENOSUMAB 120 MG/1.7ML ~~LOC~~ SOLN: 120.0000 mg | Freq: Once | SUBCUTANEOUS | Status: DC

## 2023-06-15 MED ORDER — DENOSUMAB 120 MG/1.7ML ~~LOC~~ SOLN
120.0000 mg | Freq: Once | SUBCUTANEOUS | Status: AC
  Administered 2023-06-15: 120 mg via SUBCUTANEOUS

## 2023-06-15 MED ORDER — DEXTROSE 5 % IV SOLN: Freq: Once | INTRAVENOUS | Status: AC

## 2023-06-15 MED ORDER — DIPHENHYDRAMINE HCL 25 MG PO CAPS
50.0000 mg | ORAL_CAPSULE | Freq: Once | ORAL | Status: AC
  Administered 2023-06-15: 50 mg via ORAL
  Filled 2023-06-15: qty 2

## 2023-06-15 MED ORDER — SODIUM CHLORIDE 0.9 % IV SOLN
150.0000 mg | Freq: Once | INTRAVENOUS | Status: AC
  Administered 2023-06-15: 150 mg via INTRAVENOUS
  Filled 2023-06-15: qty 150

## 2023-06-15 MED ORDER — SODIUM CHLORIDE 0.9% FLUSH
10.0000 mL | Freq: Once | INTRAVENOUS | Status: AC
  Administered 2023-06-15: 10 mL

## 2023-06-15 MED ORDER — PALONOSETRON HCL INJECTION 0.25 MG/5ML
0.2500 mg | Freq: Once | INTRAVENOUS | Status: AC
Start: 2023-06-15 — End: 2023-06-15
  Administered 2023-06-15: 0.25 mg via INTRAVENOUS
  Filled 2023-06-15: qty 5

## 2023-06-15 MED ORDER — ACETAMINOPHEN 325 MG PO TABS
650.0000 mg | ORAL_TABLET | Freq: Once | ORAL | Status: AC
  Administered 2023-06-15: 650 mg via ORAL
  Filled 2023-06-15: qty 2

## 2023-06-15 NOTE — Progress Notes (Signed)
 Patient Care Team: Ollen Bowl, MD as PCP - General (Internal Medicine) [name], MD as Consulting Physician (Hematology and Oncology) Almond Lint, MD as Consulting Physician (General Surgery) Dorothy Puffer, MD as Consulting Physician (Radiation Oncology) Carmina Miller, MD as Consulting Physician (Radiation Oncology)  DIAGNOSIS:  Encounter Diagnosis  Name Primary?   Malignant neoplasm of lower-outer quadrant of left breast of female, estrogen receptor positive (HCC) Yes    SUMMARY OF ONCOLOGIC HISTORY: Oncology History  Malignant neoplasm of lower-outer quadrant of left breast of female, estrogen receptor positive (HCC)  06/11/2016 Mammogram   Palpable left breast masses 3:00 position: 2.2 cm; 5:30 position: 2.5 cm; 6:30 position: 0.7 cm   06/19/2016 Initial Diagnosis   Left breast biopsy 3:30: IDC with DCIS grade 1, ER 90%, PR 50%, Ki-67 15%, HER-2 negative ratio 1.13; biopsy 5:30 position: IDC grade 1   07/13/2016 Breast MRI   Large area of abnormal enhancement lower inner and lower outer quadrants left breast spanning 9 cm x 6.4 cm x 5.3 cm, no abnormal enlarged lymph nodes; T3 N0 stage II a (New AJCC staging)    07/15/2016 - 12/08/2017 Anti-estrogen oral therapy   Neoadjuvant anastrozole 1 mg daily   07/17/2016 Oncotype testing   Testing done on the biopsy: Oncotype DX score 22, intermediate risk   02/02/2017 Breast MRI   Left breast multicentric disease unchanged measuring 2.7 x 1.6 cm.  Mass in the non-mass enhancement are also not significantly changed measuring 6.2 x 2.4 cm. new enhancing mass within the outer right breast 7 mm which could be fat necrosis or inclusion cyst    02/09/2017 Imaging   Ultrasound of the right breast lesion noted on MRI: No sonographic finding corresponds to the abnormality noted on MRI   07/13/2017 Cancer Staging   Staging form: Breast, AJCC 8th Edition - Clinical stage from 07/13/2017: Stage IIA (cT3, cN0, cM0, G1, ER+, PR+, HER2-) -  Signed by Loa Socks, NP on 05/18/2018   10/26/2017 Surgery   Left mastectomy: IDC grade 1, 2 foci largest spans 8.5 cm, intermediate grade DCIS, lymphovascular invasion identified, perineural invasion identified, 1/2 lymph nodes positive with extracapsular extension, ER 9200%, PR 5 to 50%, HER-2 negative, Ki-67 10 to 15%, T3N1A Mammaprint: low risk   11/02/2017 Cancer Staging   Staging form: Breast, AJCC 8th Edition - Pathologic: No Stage Recommended (ypT3, pN1a, cM0, G1, ER+, PR+, HER2-) - Signed by [name], MD on 11/02/2017   12/08/2017 - 01/26/2018 Radiation Therapy   Adjuvant radiation therapy    02/2018 -  Anti-estrogen oral therapy   Anastrozole 1 mg daily adjuvant therapy   10/21/2022 -  Chemotherapy   Patient is on Treatment Plan : BREAST METASTATIC Fam-Trastuzumab Deruxtecan-nxki (Enhertu) (5.4) q21d       CHIEF COMPLIANT:   HISTORY OF PRESENT ILLNESS:   History of Present Illness  with  If we get the pathology report and we need to switch her treatment then I will call herThe patient, with a history of skin lesions, presents with ongoing skin issues and shoulder pain. She recently had a biopsy performed on her shoulder, which has been causing her significant discomfort. She reports that the skin lesions appear to be in the same places as before, with a few small bumps scattered around. She also mentions a large scab on her scalp, which she is unsure of the origin.  In addition to her skin concerns, the patient has been experiencing neck pain, particularly at night when she  sleeps and her neck turns. She has been trying to manage this by slowly incorporating exercises at the gym, including cycling and arm workouts. However, she notes that her arm still has limited mobility and she is unable to fully lift it.  The patient also mentions difficulty sleeping, which she attributes to getting hot at night and waking up. She expresses a desire for better sleep. She also  notes some tingling in her fingers, which she believes is related to neuropathy.     ALLERGIES:  is allergic to percocet [oxycodone-acetaminophen].  MEDICATIONS:  Current Outpatient Medications  Medication Sig Dispense Refill   azelastine (ASTELIN) 0.1 % nasal spray Place into both nostrils 2 (two) times daily. (Patient not taking: Reported on 05/25/2023)     Calcium 500-100 MG-UNIT CHEW Chew 1 tablet by mouth daily. 60 tablet    cholecalciferol (VITAMIN D3) 25 MCG (1000 UNIT) tablet Take 1 tablet (1,000 Units total) by mouth daily.     dexamethasone (DECADRON) 4 MG tablet Take 1 tab for 2 days after chemo (Patient not taking: Reported on 05/25/2023) 30 tablet 1   furosemide (LASIX) 20 MG tablet Take 1 tablet (20 mg total) by mouth daily. If needed may take 1/2 tab q am 30 tablet 0   levothyroxine (SYNTHROID) 125 MCG tablet Take 1 tablet (125 mcg total) by mouth daily before breakfast. 90 tablet 0   lidocaine-prilocaine (EMLA) cream Apply 1 Application topically as needed. Apply one hour prior to port a cath access 30 g 1   LORazepam (ATIVAN) 0.5 MG tablet Take 1 tablet (0.5 mg total) by mouth at bedtime. (Patient not taking: Reported on 05/25/2023) 30 tablet 0   omeprazole (PRILOSEC) 20 MG capsule TAKE 1 CAPSULE BY MOUTH ONCE DAILY 30 capsule 3   ondansetron (ZOFRAN) 8 MG tablet Take 1 tablet (8 mg total) by mouth every 8 (eight) hours as needed for nausea or vomiting. Start on the third day after chemotherapy. (Patient not taking: Reported on 05/25/2023) 30 tablet 1   pantoprazole (PROTONIX) 40 MG tablet 1 tablet Orally Once a day for 30 days Take 1 tablet twice daily for 2 weeks then take 1 tablet daily. (Patient not taking: Reported on 05/25/2023)     prochlorperazine (COMPAZINE) 10 MG tablet Take 1 tablet (10 mg total) by mouth every 6 (six) hours as needed for nausea or vomiting. (Patient not taking: Reported on 05/25/2023) 30 tablet 1   vitamin C (ASCORBIC ACID) 250 MG tablet Take 1 tablet  (250 mg total) by mouth daily.     No current facility-administered medications for this visit.    PHYSICAL EXAMINATION: ECOG PERFORMANCE STATUS: 1 - Symptomatic but completely ambulatory  Vitals:   06/15/23 1441  BP: (!) 112/94  Pulse: (!) 119  Resp: 18  Temp: 98.7 F (37.1 C)  SpO2: 98%   Filed Weights   06/15/23 1441  Weight: 156 lb 14.4 oz (71.2 kg)    Physical Exam MEASUREMENTS: Weight- 156. SKIN: Large scab on scalp, not fully healed.  (exam performed in the presence of a chaperone)  LABORATORY DATA:  I have reviewed the data as listed    Latest Ref Rng & Units 05/25/2023    2:31 PM 05/04/2023   12:55 PM 04/13/2023    1:45 PM  CMP  Glucose 70 - 99 mg/dL 147  829  75   BUN 8 - 23 mg/dL 14  16  14    Creatinine 0.44 - 1.00 mg/dL 5.62  1.30  8.65  Sodium 135 - 145 mmol/L 137  138  139   Potassium 3.5 - 5.1 mmol/L 3.9  3.8  4.2   Chloride 98 - 111 mmol/L 106  107  108   CO2 22 - 32 mmol/L 26  25  26    Calcium 8.9 - 10.3 mg/dL 8.6  9.0  9.6   Total Protein 6.5 - 8.1 g/dL 6.2  6.1  6.3   Total Bilirubin 0.0 - 1.2 mg/dL 0.4  0.3  0.4   Alkaline Phos 38 - 126 U/L 109  102  95   AST 15 - 41 U/L 54  36  32   ALT 0 - 44 U/L 13  13  10      Lab Results  Component Value Date   WBC 3.2 (L) 05/25/2023   HGB 12.3 05/25/2023   HCT 36.7 05/25/2023   MCV 100.5 (H) 05/25/2023   PLT 209 05/25/2023   NEUTROABS 2.2 05/25/2023    ASSESSMENT & PLAN:  Malignant neoplasm of lower-outer quadrant of left breast of female, estrogen receptor positive (HCC) 10/26/17: Left mastectomy: IDC grade 1, 2 foci largest spans 8.5 cm, intermediate grade DCIS, lymphovascular invasion identified, perineural invasion identified, 1/2 lymph nodes positive with extracapsular extension, ER 9200%, PR 5 to 50%, HER-2 negative, Ki-67 10 to 15%, T3N1A   Oncotype DX score 22, intermediate risk, chemotherapy not felt to have significant benefit.   Treatment Summary: 1. Antiestrogen therapy with  anastrozole 1 mg daily started 07/15/2016 2. Mastectomy 10/26/2017, Mammaprint low risk luminal type A 3. Followed by adjuvant radiation 12/08/17- 01/26/18  4. Followed by adjuvant antiestrogen therapy anastrozole started 01/17/2018 (originally started 07/15/2016) -------------------------------------------------------------------- Low back pain August 2023: Underwent CT myelogram: Large expansile lesion in the sacrum with extraosseous extension of the tumor, diffuse lytic lesions throughout the visualized spine with metastatic disease myeloma is considered less likely.  (This was ordered by Dr. Marcene Corning)   Treatment plan: 1.  PET CT scan 12/27/2021: Widespread bone metastatic disease largest lesion involving the sacrum with pathological fractures of T6 and T10 retroperitoneal and pelvic lymph node metastasis, right axillary lymph node, activity in the pancreatic head, hypermetabolic activity in the adrenal glands, right thyroid nodule. 2. biopsy of sacrum: 12/26/2021: Metastatic breast cancer, ER 90%, PR 10%, HER2 negative (0) 3.  Ibrance along with Faslodex started 12/25/2021-10/14/2022 4.  Xgeva for bone metastases.  Every 3 months 5.  Palliative radiation to the sacrum completed 01/19/2022 ---------------------------------------------------------------------------------------------------------------------- Current treatment: Enhertu started 10/21/2022, today is cycle 9 We plan to continue this treatment irrespective of her blood counts.   Enhertu toxicities: Mild leukopenia and anemia: Stable Mild peripheral neuropathy: Stable  Tolerated extremely well without any problems. Completed radiation   Liver function tests have started to improve.   PET/CT 10/29/2022: Overall improved appearance of bone lesions.  Stable right extra lymph node, retroperitoneal lymph nodes decreased activity CT head ordered by Dr. Barbaraann Cao 11/26/2022: Extensive osseous mets on the skull, extraosseous soft tissue tumor  in the right orbit appears improved, inseparable from anterior aspect of superior sagittal sinus and left sigmoid sinus.  Unchanged innumerable scalp nodules CT CAP 02/24/2023: Liver lesions diminished in size sclerosis of widespread bone metastases CT CAP 05/21/2023: Extensive bone metastases similar, low-attenuation liver lesion not clearly seen previously (liver MRI suggested)    Cutaneous metastases: I am concerned that the Enhertu is not working very well on the cutaneous lesions.  Status post skin biopsy done yesterday by Dr. Donell Beers.  We would like to run  breast prognostic panel as well as [facility] molecular testing.    Return to clinic every 3 weeks for Enhertu treatment.  If we need to change her treatment then I will call her with that information. 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 A recent biopsy on the left shoulder will guide treatment decisions. If current treatment is ineffective, options include switching to a different medication or treatment modality, such as infusion or oral therapy. Pathology involves determining breast cancer presence, identifying markers, and assessing molecular characteristics, which may take up to two weeks. - Await biopsy results to determine the next steps in treatment. - Discuss potential treatment changes based on pathology results, which may include switching to a different medication or treatment modality. - Schedule follow-up appointment on April 22 to discuss biopsy results and treatment plan.  Scalp lesions Large, unhealed scabs on the scalp, suspected to be related to her skin condition and potentially linked to skin nodules. Treatment changes may be considered based on biopsy results. - Consider treatment changes based on biopsy results.  Vocal Cord  Dysfunction Reports improvement in voice, indicating resolution of symptoms. Avoids yogurt as it exacerbates symptoms.  Neuropathy Mild neuropathy with tingling and redness in fingers. Symptoms are not severe and do not require immediate intervention.      No orders of the defined types were placed in this encounter.  The patient has a good understanding of the overall plan. she agrees with it. she will call with any problems that may develop before the next visit here. Total time spent: 30 mins including face to face time and time spent for planning, charting and co-ordination of care   Tamsen Meek, MD 06/15/23

## 2023-06-15 NOTE — Assessment & Plan Note (Signed)
 10/26/17: Left mastectomy: IDC grade 1, 2 foci largest spans 8.5 cm, intermediate grade DCIS, lymphovascular invasion identified, perineural invasion identified, 1/2 lymph nodes positive with extracapsular extension, ER 9200%, PR 5 to 50%, HER-2 negative, Ki-67 10 to 15%, T3N1A   Oncotype DX score 22, intermediate risk, chemotherapy not felt to have significant benefit.   Treatment Summary: 1. Antiestrogen therapy with anastrozole 1 mg daily started 07/15/2016 2. Mastectomy 10/26/2017, Mammaprint low risk luminal type A 3. Followed by adjuvant radiation 12/08/17- 01/26/18  4. Followed by adjuvant antiestrogen therapy anastrozole started 01/17/2018 (originally started 07/15/2016) -------------------------------------------------------------------- Low back pain August 2023: Underwent CT myelogram: Large expansile lesion in the sacrum with extraosseous extension of the tumor, diffuse lytic lesions throughout the visualized spine with metastatic disease myeloma is considered less likely.  (This was ordered by Dr. Marcene Corning)   Treatment plan: 1.  PET CT scan 12/27/2021: Widespread bone metastatic disease largest lesion involving the sacrum with pathological fractures of T6 and T10 retroperitoneal and pelvic lymph node metastasis, right axillary lymph node, activity in the pancreatic head, hypermetabolic activity in the adrenal glands, right thyroid nodule. 2. biopsy of sacrum: 12/26/2021: Metastatic breast cancer, ER 90%, PR 10%, HER2 negative (0) 3.  Ibrance along with Faslodex started 12/25/2021-10/14/2022 4.  Xgeva for bone metastases.  Every 3 months 5.  Palliative radiation to the sacrum completed 01/19/2022 ---------------------------------------------------------------------------------------------------------------------- Current treatment: Enhertu started 10/21/2022, today is cycle 9 We plan to continue this treatment irrespective of her blood counts.   Enhertu toxicities: Tolerated extremely  well without any problems. Completed radiation   Liver function tests have started to improve.   PET/CT 10/29/2022: Overall improved appearance of bone lesions.  Stable right extra lymph node, retroperitoneal lymph nodes decreased activity CT head ordered by Dr. 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  Unchanged innumerable scalp nodules CT CAP 02/24/2023: Liver lesions diminished in size sclerosis of widespread bone metastases CT CAP 05/21/2023: Extensive bone metastases similar, low-attenuation liver lesion not clearly seen previously (liver MRI suggested)    Cutaneous metastases: I am concerned that the Enhertu is not working very well on the cutaneous lesions.  We will continue to watch and monitor them.   Return to clinic every 3 weeks for Enhertu treatment.

## 2023-06-16 ENCOUNTER — Encounter: Payer: Self-pay | Admitting: Hematology and Oncology

## 2023-06-18 ENCOUNTER — Encounter: Payer: Self-pay | Admitting: *Deleted

## 2023-06-18 LAB — DERMATOLOGY PATHOLOGY

## 2023-06-18 NOTE — Progress Notes (Signed)
 Per MD request RN successfully faxed [facility] Report for recent skin biopsy preformed 06/14/23.

## 2023-06-21 ENCOUNTER — Ambulatory Visit: Payer: Medicaid Other | Attending: Student

## 2023-06-21 ENCOUNTER — Encounter: Payer: Self-pay | Admitting: General Surgery

## 2023-06-21 ENCOUNTER — Telehealth: Payer: Self-pay | Admitting: General Surgery

## 2023-06-21 DIAGNOSIS — R490 Dysphonia: Secondary | ICD-10-CM | POA: Diagnosis present

## 2023-06-21 NOTE — Telephone Encounter (Signed)
 Discussed positive pathology from skin biopsy. Forwarded to Dr. Pamelia Hoit for molecular testing orders.

## 2023-06-21 NOTE — Therapy (Addendum)
 OUTPATIENT SPEECH LANGUAGE PATHOLOGY  VOICE TREATMENT    Patient Name: Kathryn Lucas MRN: [id number] DOB:March 07, 1961, 63 y.o., female Today's Date: 06/21/2023  PCP: Jacqulyn Bath, MD  REFERRING PROVIDER: Nicole Cella, [name]   End of Session - 06/21/23 1623     Visit Number 13    Number of Visits 24    Date for SLP Re-Evaluation 06/23/23    Authorization Type carelon order#[id number]    Authorization Time Period 3/17-5/15 for 6 SLP visits    Authorization - Visit Number 4    Authorization - Number of Visits 6    Progress Note Due on Visit 30    SLP Start Time 1530    SLP Stop Time  1615    SLP Time Calculation (min) 45 min    Activity Tolerance Patient tolerated treatment well             Past Medical History:  Diagnosis Date   Breast cancer (HCC)    left breast cancer   Cancer (HCC) 09/2017   left breast cancer   Family history of breast cancer    Hypothyroidism    Personal history of radiation therapy 2019   Thyroid disease    Past Surgical History:  Procedure Laterality Date   BREAST BIOPSY Left 2018   BREAST BIOPSY Left 2019   BREAST RECONSTRUCTION WITH PLACEMENT OF TISSUE EXPANDER AND ALLODERM Left 10/26/2017   Procedure: LEFT BREAST RECONSTRUCTION WITH PLACEMENT OF TISSUE EXPANDER AND ALLODERM;  Surgeon: Glenna Fellows, MD;  Location: [facility];  Service: Plastics;  Laterality: Left;   IR IMAGING GUIDED PORT INSERTION  04/27/2023   MASTECTOMY Left 2019   MASTECTOMY WITH RADIOACTIVE SEED GUIDED EXCISION AND AXILLARY SENTINEL LYMPH NODE BIOPSY Left 10/26/2017   Procedure: LEFT MASTECTOMY WITH SEED TARGETED  LEFT AXILLARY LYMPH NODE EXCISION AND LEFT SENTINEL LYMPH NODE BIOPSY;  Surgeon: Almond Lint, MD;  Location:  SURGERY CENTER;  Service: General;  Laterality: Left;   TONSILLECTOMY     WISDOM TOOTH EXTRACTION     Patient Active Problem List   Diagnosis Date Noted   Metastatic 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) 10/14/2022   Elevated liver enzymes  10/14/2022   Unexplained weight loss 10/14/2022   Neoplasm related pain 10/14/2022   Metastatic malignant neoplasm (HCC) 12/19/2021   Family history of breast cancer    History of therapeutic radiation 04/27/2018   Breast cancer, left breast (HCC) 10/26/2017   Malignant neoplasm of lower-outer quadrant of left breast of female, estrogen receptor positive (HCC) 07/15/2016   Plantar fasciitis 07/30/2015   Metatarsalgia 10/18/2014    ONSET DATE:  03/29/23 (referral); since July 2024 per pt  REFERRING DIAG: Hoarseness  THERAPY DIAG:  Dysphonia  Hoarseness  Rationale for Evaluation and Treatment Rehabilitation  SUBJECTIVE:   SUBJECTIVE STATEMENT: Pt alert, pleasant, and cooperative. Dysphonia apparent. Pt accompanied by: self  PERTINENT HISTORY: 63 y.o. female who presents for voice evaluation. 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 On endoscopy, 1/13, pt with weakness and bowing of B vocal cords with more weakness on L than R. Pt referred for course ST prior to consideration laryngologist referral per ENT note.  DIAGNOSTIC FINDINGS: 02/18/23, CT Chest Abdomen Pelvis, "1. Hypodense lesion of the medial liver dome, in retrospective review diminished in size compared to prior PET-CT examination. Findings are consistent with treatment response of hepatic metastatic disease. 2. Slightly increased sclerosis of widespread mixed lytic and sclerotic osseous metastatic disease throughout  the axial skeleton, consistent with treatment response. 3. Unchanged post treatment/post radiation appearance of the right chest, with perihilar fibrosis and consolidation. 4. Status post left mastectomy with saline spacer."  PAIN:  Are you having pain? No (FACES)   FALLS: defer to PT  LIVING ENVIRONMENT: Lives with: lives alone   PLOF: Independent  PATIENT GOALS    for voice to be stronger  OBJECTIVE:  TODAY'S  TREATMENT: Reviewed resonant voice therapy. Pt endorsed completing via HEP as well as semi-occluded vocal tract ex's. Pt completed sustained "ah" ascending/descending pitch glides, and singing without cueing. Notable improvement in vocal quality immediately following ex's. At the conversation level, pt averaged 75dB during 15 minutes of conversation. Mild vocal hoarseness appreciated. Pt with emerging use of forward tone/resonant voice at the conversation level. Reviewed effects of fatigue, strain, GERD, and seasonal allergies on voice.  Pt noted being seen by ENT today whom noted a 30-50% improvement in her L vocal cord mobility (per pt). Pt encouraged by progress.   PATIENT EDUCATION: Education details: HEP, POC Person educated: Patient Education method: Explanation; Handout Education comprehension: verbalized understanding   HOME EXERCISE PROGRAM: Phonation Resistance Ex's Resonant voice therapy handout      GOALS: Goals reviewed with patient? Yes  SHORT TERM GOALS: Target date: 10 sessions  Pt will improve water intake to 5-8 glasses per day per pt report. Baseline: Goal status: MET  2.  The patient will demonstrate abdominal breathing patterns and steady release of breath on exhalation to optimize efficiency of voicing and decrease laryngeal hyperfunction with min cueing.  Baseline:  Goal status: PROGRESSING  3.  The patient will eliminate phonotraumatic behaviors such as chronic throat clearing, by substituting non-traumatic methods to clear mucus with min cueing.  Baseline:  Goal status: PROGRESSING  4.  The patient will utilize a forward tone focused/resonant voice to decrease vocal hyperfunction and improve voice quality and vocal projection with min cueing. Baseline:  Goal status: PROGRESSING  5.  Pt will perform HEP for voice with min cueing. Baseline:  Goal status: MET  6.  The patient will participate in 5-8 minutes conversation, maintaining average loudness  of 75 dB and loud, good quality voice with min cues.     Baseline:  Goal status: MET  LONG TERM GOALS: Target date: 12 weeks  Pt will improve score on the VHI by 10% indicating improved perception of speech abilities. Baseline: 42/120 Goal status: PROGRESSING  2.  The patient will demonstrate abdominal breathing patterns and steady release of breath on exhalation to optimize efficiency of voicing and decrease laryngeal hyperfunction.  Baseline:  Goal status: PROGRESSING    3.  Pt will perform HEP for voice independently. Baseline:  Goal status: MET  4.  The patient will participate in 15-20 minutes conversation, maintaining average loudness of 75 dB and loud, good quality voice with min cues.     Baseline:  Goal status: PROGRESSING  ASSESSMENT:  CLINICAL IMPRESSION: Patient is a 63 y.o. female who presents for voice treatment. 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 On endoscopy, 03/29/23, pt with weakness and bowing of B vocal cords with more weakness on L than R. Pt referred for course ST prior to consideration laryngologist referral per ENT note. Pt's voice with perceptual characteristics of breathiness, hoarseness, volume decay, and diplophonia. Pt with tendency to utilize clavicular breathing. Pt with stimulable to resonant voice therapy and clear speech therapy. Pt reports dissatisfactoin with voice  particularly in noisy environments or instances when she needs to project (e.g. bible study). See details of today's treatment above. Recommend course of speech therapy to improve pt's voice in social settings and for ADLs/iADLs.  OBJECTIVE IMPAIRMENTS include voice disorder. These impairments are limiting patient from effectively communicating at home and in community. Factors affecting potential to achieve goals and functional outcome are  overall medical status/comorbidities . Patient will benefit from skilled SLP services  to address above impairments and improve overall function.  REHAB POTENTIAL: Good  PLAN: SLP FREQUENCY: 1-2x/week  SLP DURATION: 12 weeks  PLANNED INTERVENTIONS: Cueing hierachy, Internal/external aids, Functional tasks, Multimodal communication approach, SLP instruction and feedback, Compensatory strategies, and Patient/family education    Clyde Canterbury, M.S., CCC-SLP Speech-Language Pathologist [place] [facility] [phone] Arnette Felts)  [facility] at [facility] [address] [place], [place], [address] Phone: [phone]   Fax:  [phone]

## 2023-06-22 ENCOUNTER — Encounter: Payer: Self-pay | Admitting: *Deleted

## 2023-06-22 ENCOUNTER — Encounter: Payer: Self-pay | Admitting: Hematology and Oncology

## 2023-06-28 ENCOUNTER — Ambulatory Visit: Payer: Medicaid Other

## 2023-06-28 DIAGNOSIS — R490 Dysphonia: Secondary | ICD-10-CM | POA: Diagnosis not present

## 2023-06-28 NOTE — Therapy (Signed)
 OUTPATIENT SPEECH LANGUAGE PATHOLOGY  VOICE TREATMENT    Patient Name: Kathryn Lucas MRN: [id number] DOB:1960-12-28, 63 y.o., female Today's Date: 06/28/2023  PCP: Lilyan Remedies, MD  REFERRING PROVIDER: Zoanne Hinders, [name]   End of Session - 06/28/23 1537     Visit Number 14    Number of Visits 24    Date for SLP Re-Evaluation 06/23/23    Authorization Type carelon order#[id number]    Authorization Time Period 3/17-5/15 for 6 SLP visits    Authorization - Visit Number 5    Authorization - Number of Visits 6    Progress Note Due on Visit 30    SLP Start Time 1402    SLP Stop Time  1445    SLP Time Calculation (min) 43 min    Activity Tolerance Patient tolerated treatment well             Past Medical History:  Diagnosis Date   Breast cancer (HCC)    left breast cancer   Cancer (HCC) 09/2017   left breast cancer   Family history of breast cancer    Hypothyroidism    Personal history of radiation therapy 2019   Thyroid disease    Past Surgical History:  Procedure Laterality Date   BREAST BIOPSY Left 2018   BREAST BIOPSY Left 2019   BREAST RECONSTRUCTION WITH PLACEMENT OF TISSUE EXPANDER AND ALLODERM Left 10/26/2017   Procedure: LEFT BREAST RECONSTRUCTION WITH PLACEMENT OF TISSUE EXPANDER AND ALLODERM;  Surgeon: Alger Infield, MD;  Location: [facility];  Service: Plastics;  Laterality: Left;   IR IMAGING GUIDED PORT INSERTION  04/27/2023   MASTECTOMY Left 2019   MASTECTOMY WITH RADIOACTIVE SEED GUIDED EXCISION AND AXILLARY SENTINEL LYMPH NODE BIOPSY Left 10/26/2017   Procedure: LEFT MASTECTOMY WITH SEED TARGETED  LEFT AXILLARY LYMPH NODE EXCISION AND LEFT SENTINEL LYMPH NODE BIOPSY;  Surgeon: Lockie Rima, MD;  Location: [facility];  Service: General;  Laterality: Left;   TONSILLECTOMY     WISDOM TOOTH EXTRACTION     Patient Active Problem List   Diagnosis Date Noted   Metastatic 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) 10/14/2022   Elevated liver enzymes  10/14/2022   Unexplained weight loss 10/14/2022   Neoplasm related pain 10/14/2022   Metastatic malignant neoplasm (HCC) 12/19/2021   Family history of breast cancer    History of therapeutic radiation 04/27/2018   Breast cancer, left breast (HCC) 10/26/2017   Malignant neoplasm of lower-outer quadrant of left breast of female, estrogen receptor positive (HCC) 07/15/2016   Plantar fasciitis 07/30/2015   Metatarsalgia 10/18/2014    ONSET DATE:  03/29/23 (referral); since July 2024 per pt  REFERRING DIAG: Hoarseness  THERAPY DIAG:  Dysphonia  Hoarseness  Rationale for Evaluation and Treatment Rehabilitation  SUBJECTIVE:   SUBJECTIVE STATEMENT: Pt alert, pleasant, and cooperative. Dysphonia apparent. Pt accompanied by: self  PERTINENT HISTORY: 63 y.o. female who presents for voice evaluation. 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 On endoscopy, 1/13, pt with weakness and bowing of B vocal cords with more weakness on L than R. Pt referred for course ST prior to consideration laryngologist referral per ENT note.  DIAGNOSTIC FINDINGS: 02/18/23, CT Chest Abdomen Pelvis, "1. Hypodense lesion of the medial liver dome, in retrospective review diminished in size compared to prior PET-CT examination. Findings are consistent with treatment response of hepatic metastatic disease. 2. Slightly increased sclerosis of widespread mixed lytic and sclerotic osseous metastatic disease throughout  the axial skeleton, consistent with treatment response. 3. Unchanged post treatment/post radiation appearance of the right chest, with perihilar fibrosis and consolidation. 4. Status post left mastectomy with saline spacer."  PAIN:  Are you having pain? No (FACES)   FALLS: defer to PT  LIVING ENVIRONMENT: Lives with: lives alone   PLOF: Independent  PATIENT GOALS    for voice to be stronger  OBJECTIVE:  TODAY'S  TREATMENT: Reviewed resonant voice therapy. Pt endorsed completing via HEP as well as semi-occluded vocal tract ex's. Pt completed sustained "ah" ascending/descending pitch glides, and singing without initial cueing for increased length of sustained "ah." Notable improvement in vocal quality immediately following ex's. At the conversation level, pt averaged 75-78dB during 15 minutes of conversation. Mild vocal hoarseness appreciated. Pt with emerging use of forward tone/resonant voice at the conversation level. Endorsing much improved speaking and singing voice.   PATIENT EDUCATION: Education details: HEP, POC Person educated: Patient Education method: Explanation; Handout Education comprehension: verbalized understanding   HOME EXERCISE PROGRAM: Phonation Resistance Ex's Resonant voice therapy handout      GOALS: Goals reviewed with patient? Yes  SHORT TERM GOALS: Target date: 10 sessions  Pt will improve water intake to 5-8 glasses per day per pt report. Baseline: Goal status: MET  2.  The patient will demonstrate abdominal breathing patterns and steady release of breath on exhalation to optimize efficiency of voicing and decrease laryngeal hyperfunction with min cueing.  Baseline:  Goal status: PROGRESSING  3.  The patient will eliminate phonotraumatic behaviors such as chronic throat clearing, by substituting non-traumatic methods to clear mucus with min cueing.  Baseline:  Goal status: PROGRESSING  4.  The patient will utilize a forward tone focused/resonant voice to decrease vocal hyperfunction and improve voice quality and vocal projection with min cueing. Baseline:  Goal status: PROGRESSING  5.  Pt will perform HEP for voice with min cueing. Baseline:  Goal status: MET  6.  The patient will participate in 5-8 minutes conversation, maintaining average loudness of 75 dB and loud, good quality voice with min cues.     Baseline:  Goal status: MET  LONG TERM GOALS:  Target date: 12 weeks  Pt will improve score on the VHI by 10% indicating improved perception of speech abilities. Baseline: 42/120 Goal status: PROGRESSING  2.  The patient will demonstrate abdominal breathing patterns and steady release of breath on exhalation to optimize efficiency of voicing and decrease laryngeal hyperfunction.  Baseline:  Goal status: PROGRESSING    3.  Pt will perform HEP for voice independently. Baseline:  Goal status: MET  4.  The patient will participate in 15-20 minutes conversation, maintaining average loudness of 75 dB and loud, good quality voice with min cues.     Baseline:  Goal status: PROGRESSING  ASSESSMENT:  CLINICAL IMPRESSION: Patient is a 63 y.o. female who presents for voice treatment. 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 On endoscopy, 03/29/23, pt with weakness and bowing of B vocal cords with more weakness on L than R. Pt referred for course ST prior to consideration laryngologist referral per ENT note. Pt's voice with perceptual characteristics of breathiness, hoarseness, volume decay, and diplophonia. Pt with tendency to utilize clavicular breathing. Pt with stimulable to resonant voice therapy and clear speech therapy. Pt reports dissatisfactoin with voice particularly in noisy environments or instances when she needs to project (e.g. bible study). See details of today's treatment above. Recommend course  of speech therapy to improve pt's voice in social settings and for ADLs/iADLs.  OBJECTIVE IMPAIRMENTS include voice disorder. These impairments are limiting patient from effectively communicating at home and in community. Factors affecting potential to achieve goals and functional outcome are  overall medical status/comorbidities . Patient will benefit from skilled SLP services to address above impairments and improve overall function.  REHAB POTENTIAL: Good  PLAN: SLP  FREQUENCY: 1-2x/week  SLP DURATION: 12 weeks  PLANNED INTERVENTIONS: Cueing hierachy, Internal/external aids, Functional tasks, Multimodal communication approach, SLP instruction and feedback, Compensatory strategies, and Patient/family education    Dia Forget, M.S., CCC-SLP Speech-Language Pathologist [place] [facility] [phone] Rogers Clayman)  [facility] Outpatient Rehabilitation at [facility] [address] [place], [place], [address] Phone: [phone]   Fax:  [phone]

## 2023-06-30 ENCOUNTER — Encounter: Payer: Self-pay | Admitting: Hematology and Oncology

## 2023-06-30 ENCOUNTER — Other Ambulatory Visit: Payer: Self-pay | Admitting: *Deleted

## 2023-06-30 DIAGNOSIS — Z170 Estrogen receptor positive status [ER+]: Secondary | ICD-10-CM

## 2023-07-05 ENCOUNTER — Ambulatory Visit: Payer: Medicaid Other

## 2023-07-05 DIAGNOSIS — R490 Dysphonia: Secondary | ICD-10-CM

## 2023-07-05 MED FILL — Fosaprepitant Dimeglumine For IV Infusion 150 MG (Base Eq): INTRAVENOUS | Qty: 5 | Status: AC

## 2023-07-05 NOTE — Therapy (Signed)
 OUTPATIENT SPEECH LANGUAGE PATHOLOGY  VOICE TREATMENT    Patient Name: Kathryn Lucas MRN: [id number] DOB:1960-11-01, 63 y.o., female Today's Date: 07/05/2023  PCP: Lilyan Remedies, MD  REFERRING PROVIDER: Zoanne Hinders, [name]   End of Session - 07/05/23 1435     Visit Number 15    Number of Visits 24    Date for SLP Re-Evaluation 06/23/23    Authorization Type carelon order#[id number]    Authorization Time Period 3/17-5/15 for 6 SLP visits    Authorization - Visit Number 6    Authorization - Number of Visits 6    SLP Start Time 1400    SLP Stop Time  1435    SLP Time Calculation (min) 35 min    Activity Tolerance Patient tolerated treatment well             Past Medical History:  Diagnosis Date   Breast cancer (HCC)    left breast cancer   Cancer (HCC) 09/2017   left breast cancer   Family history of breast cancer    Hypothyroidism    Personal history of radiation therapy 2019   Thyroid  disease    Past Surgical History:  Procedure Laterality Date   BREAST BIOPSY Left 2018   BREAST BIOPSY Left 2019   BREAST RECONSTRUCTION WITH PLACEMENT OF TISSUE EXPANDER AND ALLODERM Left 10/26/2017   Procedure: LEFT BREAST RECONSTRUCTION WITH PLACEMENT OF TISSUE EXPANDER AND ALLODERM;  Surgeon: Alger Infield, MD;  Location: [facility];  Service: Plastics;  Laterality: Left;   IR IMAGING GUIDED PORT INSERTION  04/27/2023   MASTECTOMY Left 2019   MASTECTOMY WITH RADIOACTIVE SEED GUIDED EXCISION AND AXILLARY SENTINEL LYMPH NODE BIOPSY Left 10/26/2017   Procedure: LEFT MASTECTOMY WITH SEED TARGETED  LEFT AXILLARY LYMPH NODE EXCISION AND LEFT SENTINEL LYMPH NODE BIOPSY;  Surgeon: Lockie Rima, MD;  Location: [facility];  Service: General;  Laterality: Left;   TONSILLECTOMY     WISDOM TOOTH EXTRACTION     Patient Active Problem List   Diagnosis Date Noted   Metastatic 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) 10/14/2022   Elevated liver enzymes 10/14/2022   Unexplained weight loss  10/14/2022   Neoplasm related pain 10/14/2022   Metastatic malignant neoplasm (HCC) 12/19/2021   Family history of breast cancer    History of therapeutic radiation 04/27/2018   Breast cancer, left breast (HCC) 10/26/2017   Malignant neoplasm of lower-outer quadrant of left breast of female, estrogen receptor positive (HCC) 07/15/2016   Plantar fasciitis 07/30/2015   Metatarsalgia 10/18/2014    ONSET DATE:  03/29/23 (referral); since July 2024 per pt  REFERRING DIAG: Hoarseness  THERAPY DIAG:  Dysphonia  Hoarseness  Rationale for Evaluation and Treatment Rehabilitation  SUBJECTIVE:   SUBJECTIVE STATEMENT: Pt alert, pleasant, and cooperative. Dysphonia apparent. Pt accompanied by: self  PERTINENT HISTORY: 63 y.o. female who presents for voice evaluation. 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 On endoscopy, 1/13, pt with weakness and bowing of B vocal cords with more weakness on L than R. Pt referred for course ST prior to consideration laryngologist referral per ENT note.  DIAGNOSTIC FINDINGS: 02/18/23, CT Chest Abdomen Pelvis, "1. Hypodense lesion of the medial liver dome, in retrospective review diminished in size compared to prior PET-CT examination. Findings are consistent with treatment response of hepatic metastatic disease. 2. Slightly increased sclerosis of widespread mixed lytic and sclerotic osseous metastatic disease throughout the axial skeleton, consistent with treatment response. 3. Unchanged  post treatment/post radiation appearance of the right chest, with perihilar fibrosis and consolidation. 4. Status post left mastectomy with saline spacer."  PAIN:  Are you having pain? No (FACES)   FALLS: defer to PT  LIVING ENVIRONMENT: Lives with: lives alone   PLOF: Independent  PATIENT GOALS    for voice to be stronger  OBJECTIVE:  TODAY'S TREATMENT: Reviewed HEP for voice. Pt endorsed indep  completion.   Repeated PROM with score as follows: VOICE HANDICAP INDEX (VHI)  The Voice Handicap Index is comprised of a series of questions to assess the patient's perception of their voice. It is designed to evaluate the emotional, physical and functional components of the voice problem.  Functional: 9 Physical: 1 Emotional: 1 Total: 11/120 - previous score 42/120   PATIENT EDUCATION: Education details: HEP, POC Person educated: Patient Education method: Explanation; Handout Education comprehension: verbalized understanding   HOME EXERCISE PROGRAM: Phonation Resistance Ex's Resonant voice therapy handout      GOALS: Goals reviewed with patient? Yes  SHORT TERM GOALS: Target date: 10 sessions  Pt will improve water intake to 5-8 glasses per day per pt report. Baseline: Goal status: MET  2.  The patient will demonstrate abdominal breathing patterns and steady release of breath on exhalation to optimize efficiency of voicing and decrease laryngeal hyperfunction with min cueing.  Baseline:  Goal status: MET  3.  The patient will eliminate phonotraumatic behaviors such as chronic throat clearing, by substituting non-traumatic methods to clear mucus with min cueing.  Baseline:  Goal status: MET  4.  The patient will utilize a forward tone focused/resonant voice to decrease vocal hyperfunction and improve voice quality and vocal projection with min cueing. Baseline:  Goal status: MET  5.  Pt will perform HEP for voice with min cueing. Baseline:  Goal status: MET  6.  The patient will participate in 5-8 minutes conversation, maintaining average loudness of 75 dB and loud, good quality voice with min cues.     Baseline:  Goal status: MET  LONG TERM GOALS: Target date: 12 weeks  Pt will improve score on the VHI by 10% indicating improved perception of speech abilities. Baseline: 42/120 Goal status: MET  2.  The patient will demonstrate abdominal breathing  patterns and steady release of breath on exhalation to optimize efficiency of voicing and decrease laryngeal hyperfunction.  Baseline:  Goal status: MET    3.  Pt will perform HEP for voice independently. Baseline:  Goal status: MET  4.  The patient will participate in 15-20 minutes conversation, maintaining average loudness of 75 dB and loud, good quality voice with min cues.     Baseline:  Goal status: MET  ASSESSMENT:  CLINICAL IMPRESSION: Patient is a 63 y.o. female who presents for voice treatment. 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 On endoscopy, 03/29/23, pt with weakness and bowing of B vocal cords with more weakness on L than R. Pt referred for course ST prior to consideration laryngologist referral per ENT note. Pt continues with mild hoarseness which improves with resonant voice/SOVT, but improved vocal loudness. Pt has met all ST goals and is indep with HEP. D/C ST. See details of today's treatment above.     PLAN: D/C ST    Dia Forget, M.S., CCC-SLP Speech-Language Pathologist [facility] [phone] Rogers Clayman)  [facility] Outpatient Rehabilitation at [facility] [address] [place], [place],  [address] Phone: [phone]   Fax:  [phone]

## 2023-07-06 ENCOUNTER — Encounter: Payer: Self-pay | Admitting: Hematology and Oncology

## 2023-07-06 ENCOUNTER — Inpatient Hospital Stay: Payer: Medicaid Other

## 2023-07-06 ENCOUNTER — Other Ambulatory Visit: Payer: Self-pay | Admitting: *Deleted

## 2023-07-06 ENCOUNTER — Inpatient Hospital Stay (HOSPITAL_BASED_OUTPATIENT_CLINIC_OR_DEPARTMENT_OTHER): Payer: Medicaid Other | Admitting: Hematology and Oncology

## 2023-07-06 VITALS — BP 100/80 | HR 126 | Temp 98.2°F | Resp 18 | Ht 70.0 in | Wt 154.3 lb

## 2023-07-06 VITALS — HR 117

## 2023-07-06 DIAGNOSIS — C50512 Malignant neoplasm of lower-outer quadrant of left female breast: Secondary | ICD-10-CM

## 2023-07-06 DIAGNOSIS — Z5181 Encounter for therapeutic drug level monitoring: Secondary | ICD-10-CM

## 2023-07-06 DIAGNOSIS — Z5112 Encounter for antineoplastic immunotherapy: Secondary | ICD-10-CM | POA: Diagnosis not present

## 2023-07-06 DIAGNOSIS — C799 Secondary malignant neoplasm of unspecified site: Secondary | ICD-10-CM

## 2023-07-06 DIAGNOSIS — Z170 Estrogen receptor positive status [ER+]: Secondary | ICD-10-CM

## 2023-07-06 DIAGNOSIS — E039 Hypothyroidism, unspecified: Secondary | ICD-10-CM

## 2023-07-06 LAB — CBC WITH DIFFERENTIAL (CANCER CENTER ONLY)
Abs Immature Granulocytes: 0.02 10*3/uL (ref 0.00–0.07)
Basophils Absolute: 0 10*3/uL (ref 0.0–0.1)
Basophils Relative: 1 %
Eosinophils Absolute: 0.1 10*3/uL (ref 0.0–0.5)
Eosinophils Relative: 3 %
HCT: 36.3 % (ref 36.0–46.0)
Hemoglobin: 12.3 g/dL (ref 12.0–15.0)
Immature Granulocytes: 1 %
Lymphocytes Relative: 14 %
Lymphs Abs: 0.4 10*3/uL — ABNORMAL LOW (ref 0.7–4.0)
MCH: 33.8 pg (ref 26.0–34.0)
MCHC: 33.9 g/dL (ref 30.0–36.0)
MCV: 99.7 fL (ref 80.0–100.0)
Monocytes Absolute: 0.5 10*3/uL (ref 0.1–1.0)
Monocytes Relative: 17 %
Neutro Abs: 2 10*3/uL (ref 1.7–7.7)
Neutrophils Relative %: 64 %
Platelet Count: 192 10*3/uL (ref 150–400)
RBC: 3.64 MIL/uL — ABNORMAL LOW (ref 3.87–5.11)
RDW: 16.5 % — ABNORMAL HIGH (ref 11.5–15.5)
WBC Count: 3.1 10*3/uL — ABNORMAL LOW (ref 4.0–10.5)
nRBC: 0 % (ref 0.0–0.2)

## 2023-07-06 LAB — CMP (CANCER CENTER ONLY)
ALT: 17 U/L (ref 0–44)
AST: 76 U/L — ABNORMAL HIGH (ref 15–41)
Albumin: 3.8 g/dL (ref 3.5–5.0)
Alkaline Phosphatase: 129 U/L — ABNORMAL HIGH (ref 38–126)
Anion gap: 7 (ref 5–15)
BUN: 13 mg/dL (ref 8–23)
CO2: 25 mmol/L (ref 22–32)
Calcium: 8.7 mg/dL — ABNORMAL LOW (ref 8.9–10.3)
Chloride: 106 mmol/L (ref 98–111)
Creatinine: 0.65 mg/dL (ref 0.44–1.00)
GFR, Estimated: >60 mL/min (ref >60)
Glucose, Bld: 124 mg/dL — ABNORMAL HIGH (ref 70–99)
Potassium: 3.9 mmol/L (ref 3.5–5.1)
Sodium: 138 mmol/L (ref 135–145)
Total Bilirubin: 0.5 mg/dL (ref 0.0–1.2)
Total Protein: 6.4 g/dL — ABNORMAL LOW (ref 6.5–8.1)

## 2023-07-06 LAB — T4, FREE: Free T4: 1.15 ng/dL — ABNORMAL HIGH (ref 0.61–1.12)

## 2023-07-06 LAB — TSH: TSH: 1.63 u[IU]/mL (ref 0.350–4.500)

## 2023-07-06 LAB — IRON AND IRON BINDING CAPACITY (CC-WL,HP ONLY)
Iron: 54 ug/dL (ref 28–170)
Saturation Ratios: 18 % (ref 10.4–31.8)
TIBC: 302 ug/dL (ref 250–450)
UIBC: 248 ug/dL (ref 148–442)

## 2023-07-06 MED ORDER — SODIUM CHLORIDE 0.9 % IV SOLN
150.0000 mg | Freq: Once | INTRAVENOUS | Status: AC
  Administered 2023-07-06: 150 mg via INTRAVENOUS
  Filled 2023-07-06: qty 150

## 2023-07-06 MED ORDER — FAM-TRASTUZUMAB DERUXTECAN-NXKI CHEMO 100 MG IV SOLR
3.2000 mg/kg | Freq: Once | INTRAVENOUS | Status: AC
  Administered 2023-07-06: 200 mg via INTRAVENOUS
  Filled 2023-07-06: qty 10

## 2023-07-06 MED ORDER — HEPARIN SOD (PORK) LOCK FLUSH 100 UNIT/ML IV SOLN
500.0000 [IU] | Freq: Once | INTRAVENOUS | Status: AC | PRN
  Administered 2023-07-06: 500 [IU]

## 2023-07-06 MED ORDER — DEXTROSE 5 % IV SOLN: Freq: Once | INTRAVENOUS | Status: AC

## 2023-07-06 MED ORDER — SODIUM CHLORIDE 0.9% FLUSH
10.0000 mL | INTRAVENOUS | Status: DC | PRN
  Administered 2023-07-06: 10 mL

## 2023-07-06 MED ORDER — ACETAMINOPHEN 325 MG PO TABS
650.0000 mg | ORAL_TABLET | Freq: Once | ORAL | Status: AC
  Administered 2023-07-06: 650 mg via ORAL
  Filled 2023-07-06: qty 2

## 2023-07-06 MED ORDER — DIPHENHYDRAMINE HCL 25 MG PO CAPS
50.0000 mg | ORAL_CAPSULE | Freq: Once | ORAL | Status: AC
  Administered 2023-07-06: 50 mg via ORAL
  Filled 2023-07-06: qty 2

## 2023-07-06 MED ORDER — SODIUM CHLORIDE 0.9% FLUSH
10.0000 mL | Freq: Once | INTRAVENOUS | Status: AC
  Administered 2023-07-06: 10 mL

## 2023-07-06 MED ORDER — DEXAMETHASONE SODIUM PHOSPHATE 10 MG/ML IJ SOLN
10.0000 mg | Freq: Once | INTRAMUSCULAR | Status: AC
  Administered 2023-07-06: 10 mg via INTRAVENOUS
  Filled 2023-07-06: qty 1

## 2023-07-06 MED ORDER — PALONOSETRON HCL INJECTION 0.25 MG/5ML
0.2500 mg | Freq: Once | INTRAVENOUS | Status: AC
  Administered 2023-07-06: 0.25 mg via INTRAVENOUS
  Filled 2023-07-06: qty 5

## 2023-07-06 NOTE — Progress Notes (Signed)
 Patient Care Team: Elester Grim, MD as PCP - General (Internal Medicine) Cameron Cea, MD as Consulting Physician (Hematology and Oncology) Lockie Rima, MD as Consulting Physician (General Surgery) Johna Myers, MD as Consulting Physician (Radiation Oncology) Glenis Langdon, MD as Consulting Physician (Radiation Oncology)  DIAGNOSIS:  Encounter Diagnosis  Name Primary?   Malignant neoplasm of lower-outer quadrant of left breast of female, estrogen receptor positive (HCC) Yes    SUMMARY OF ONCOLOGIC HISTORY: Oncology History  Malignant neoplasm of lower-outer quadrant of left breast of female, estrogen receptor positive (HCC)  06/11/2016 Mammogram   Palpable left breast masses 3:00 position: 2.2 cm; 5:30 position: 2.5 cm; 6:30 position: 0.7 cm   06/19/2016 Initial Diagnosis   Left breast biopsy 3:30: IDC with DCIS grade 1, ER 90%, PR 50%, Ki-67 15%, HER-2 negative ratio 1.13; biopsy 5:30 position: IDC grade 1   07/13/2016 Breast MRI   Large area of abnormal enhancement lower inner and lower outer quadrants left breast spanning 9 cm x 6.4 cm x 5.3 cm, no abnormal enlarged lymph nodes; T3 N0 stage II a (New AJCC staging)    07/15/2016 - 12/08/2017 Anti-estrogen oral therapy   Neoadjuvant anastrozole  1 mg daily   07/17/2016 Oncotype testing   Testing done on the biopsy: Oncotype DX score 22, intermediate risk   02/02/2017 Breast MRI   Left breast multicentric disease unchanged measuring 2.7 x 1.6 cm.  Mass in the non-mass enhancement are also not significantly changed measuring 6.2 x 2.4 cm. new enhancing mass within the outer right breast 7 mm which could be fat necrosis or inclusion cyst    02/09/2017 Imaging   Ultrasound of the right breast lesion noted on MRI: No sonographic finding corresponds to the abnormality noted on MRI   07/13/2017 Cancer Staging   Staging form: Breast, AJCC 8th Edition - Clinical stage from 07/13/2017: Stage IIA (cT3, cN0, cM0, G1, ER+, PR+, HER2-) -  Signed by Percival Brace, NP on 05/18/2018   10/26/2017 Surgery   Left mastectomy: IDC grade 1, 2 foci largest spans 8.5 cm, intermediate grade DCIS, lymphovascular invasion identified, perineural invasion identified, 1/2 lymph nodes positive with extracapsular extension, ER 9200%, PR 5 to 50%, HER-2 negative, Ki-67 10 to 15%, T3N1A Mammaprint: low risk   11/02/2017 Cancer Staging   Staging form: Breast, AJCC 8th Edition - Pathologic: No Stage Recommended (ypT3, pN1a, cM0, G1, ER+, PR+, HER2-) - Signed by Cameron Cea, MD on 11/02/2017   12/08/2017 - 01/26/2018 Radiation Therapy   Adjuvant radiation therapy    02/2018 -  Anti-estrogen oral therapy   Anastrozole  1 mg daily adjuvant therapy   10/21/2022 -  Chemotherapy   Patient is on Treatment Plan : BREAST METASTATIC Fam-Trastuzumab Deruxtecan-nxki  (Enhertu ) (5.4) q21d       CHIEF COMPLIANT: Follow-up to discuss the results of skin biopsy and molecular testing  HISTORY OF PRESENT ILLNESS:   History of Present Illness The patient, with a history of estrogen receptor positive breast cancer with skin metastases, presents for a follow-up visit. She reports an increase in skin lesions despite current treatment with Enhertu . The patient describes the lesions as bumps on the skin, primarily located on the back. She expresses concern about the progression of these lesions and the potential for them to open and ooze. The patient also reports some soreness and neuropathy, but overall, she feels she is doing well. She is able to eat, walk, and drive without assistance. The patient's caregiver is present and actively involved in  the discussion and decision-making process.     ALLERGIES:  is allergic to percocet [oxycodone -acetaminophen ].  MEDICATIONS:  Current Outpatient Medications  Medication Sig Dispense Refill   Calcium  500-100 MG-UNIT CHEW Chew 1 tablet by mouth daily. 60 tablet    cholecalciferol (VITAMIN D3) 25 MCG (1000 UNIT)  tablet Take 1 tablet (1,000 Units total) by mouth daily.     furosemide  (LASIX ) 20 MG tablet Take 1 tablet (20 mg total) by mouth daily. If needed may take 1/2 tab q am 30 tablet 0   levothyroxine  (SYNTHROID ) 125 MCG tablet Take 1 tablet (125 mcg total) by mouth daily before breakfast. 90 tablet 0   lidocaine -prilocaine  (EMLA ) cream Apply 1 Application topically as needed. Apply one hour prior to port a cath access 30 g 1   omeprazole  (PRILOSEC) 20 MG capsule TAKE 1 CAPSULE BY MOUTH ONCE DAILY 30 capsule 3   vitamin C  (ASCORBIC ACID) 250 MG tablet Take 1 tablet (250 mg total) by mouth daily.     azelastine (ASTELIN) 0.1 % nasal spray Place into both nostrils 2 (two) times daily. (Patient not taking: Reported on 11/03/2022)     dexamethasone  (DECADRON ) 4 MG tablet Take 1 tab for 2 days after chemo (Patient not taking: Reported on 07/06/2023) 30 tablet 1   LORazepam  (ATIVAN ) 0.5 MG tablet Take 1 tablet (0.5 mg total) by mouth at bedtime. (Patient not taking: Reported on 07/06/2023) 30 tablet 0   ondansetron  (ZOFRAN ) 8 MG tablet Take 1 tablet (8 mg total) by mouth every 8 (eight) hours as needed for nausea or vomiting. Start on the third day after chemotherapy. (Patient not taking: Reported on 11/03/2022) 30 tablet 1   pantoprazole (PROTONIX) 40 MG tablet 1 tablet Orally Once a day for 30 days Take 1 tablet twice daily for 2 weeks then take 1 tablet daily. (Patient not taking: Reported on 12/28/2022)     prochlorperazine  (COMPAZINE ) 10 MG tablet Take 1 tablet (10 mg total) by mouth every 6 (six) hours as needed for nausea or vomiting. (Patient not taking: Reported on 11/03/2022) 30 tablet 1   No current facility-administered medications for this visit.    PHYSICAL EXAMINATION: ECOG PERFORMANCE STATUS: 1 - Symptomatic but completely ambulatory  There were no vitals filed for this visit. There were no vitals filed for this visit.  Physical Exam SKIN: Multiple bumps on the back and a single bump on the  chest.  (exam performed in the presence of a chaperone)  LABORATORY DATA:  I have reviewed the data as listed    Latest Ref Rng & Units 06/15/2023    2:21 PM 05/25/2023    2:31 PM 05/04/2023   12:55 PM  CMP  Glucose 70 - 99 mg/dL 89  161  096   BUN 8 - 23 mg/dL 15  14  16    Creatinine 0.44 - 1.00 mg/dL 0.45  4.09  8.11   Sodium 135 - 145 mmol/L 137  137  138   Potassium 3.5 - 5.1 mmol/L 4.1  3.9  3.8   Chloride 98 - 111 mmol/L 107  106  107   CO2 22 - 32 mmol/L 25  26  25    Calcium  8.9 - 10.3 mg/dL 9.0  8.6  9.0   Total Protein 6.5 - 8.1 g/dL 6.3  6.2  6.1   Total Bilirubin 0.0 - 1.2 mg/dL 0.4  0.4  0.3   Alkaline Phos 38 - 126 U/L 105  109  102   AST 15 -  41 U/L 71  54  36   ALT 0 - 44 U/L 13  13  13      Lab Results  Component Value Date   WBC 3.1 (L) 07/06/2023   HGB 12.3 07/06/2023   HCT 36.3 07/06/2023   MCV 99.7 07/06/2023   PLT 192 07/06/2023   NEUTROABS 2.0 07/06/2023    ASSESSMENT & PLAN:  Malignant neoplasm of lower-outer quadrant of left breast of female, estrogen receptor positive (HCC) 10/26/17: Left mastectomy: IDC grade 1, 2 foci largest spans 8.5 cm, intermediate grade DCIS, lymphovascular invasion identified, perineural invasion identified, 1/2 lymph nodes positive with extracapsular extension, ER 9200%, PR 5 to 50%, HER-2 negative, Ki-67 10 to 15%, T3N1A   Oncotype DX score 22, intermediate risk, chemotherapy not felt to have significant benefit.   Treatment Summary: 1. Antiestrogen therapy with anastrozole  1 mg daily started 07/15/2016 2. Mastectomy 10/26/2017, Mammaprint low risk luminal type A 3. Followed by adjuvant radiation 12/08/17- 01/26/18  4. Followed by adjuvant antiestrogen therapy anastrozole  started 01/17/2018 (originally started 07/15/2016) -------------------------------------------------------------------- Low back pain August 2023: Underwent CT myelogram: Large expansile lesion in the sacrum with extraosseous extension of the tumor, diffuse  lytic lesions throughout the visualized spine with metastatic disease myeloma is considered less likely.  (This was ordered by Dr. Dayne Even)   Treatment plan: 1.  PET CT scan 12/27/2021: Widespread bone metastatic disease largest lesion involving the sacrum with pathological fractures of T6 and T10 retroperitoneal and pelvic lymph node metastasis, right axillary lymph node, activity in the pancreatic head, hypermetabolic activity in the adrenal glands, right thyroid  nodule. 2. biopsy of sacrum: 12/26/2021: Metastatic breast cancer, ER 90%, PR 10%, HER2 negative (0) 3.  Ibrance  along with Faslodex  started 12/25/2021-10/14/2022 4.  Xgeva  for bone metastases.  Every 3 months 5.  Palliative radiation to the sacrum completed 01/19/2022 ---------------------------------------------------------------------------------------------------------------------- Current treatment: Enhertu  started 10/21/2022, today is cycle 9   Enhertu  toxicities: Mild leukopenia and anemia: Stable Mild peripheral neuropathy: Stable   Tolerated extremely well without any problems. Completed radiation   Liver function tests have started to improve.   PET/CT 10/29/2022: Overall improved appearance of bone lesions.  Stable right extra lymph node, retroperitoneal lymph nodes decreased activity CT head ordered by Dr. Mark Sil 11/26/2022: Extensive osseous mets on the skull, extraosseous soft tissue tumor in the right orbit appears improved, inseparable from anterior aspect of superior sagittal sinus and left sigmoid sinus.  Unchanged innumerable scalp nodules CT CAP 02/24/2023: Liver lesions diminished in size sclerosis of widespread bone metastases CT CAP 05/21/2023: Extensive bone metastases similar, low-attenuation liver lesion not clearly seen previously (liver MRI suggested)    Cutaneous metastases: I am concerned that the Enhertu  is not working very well on the cutaneous lesions.  Status post skin biopsy 06/14/2023: Poorly  differentiated adenocarcinoma x 2 biopsies: ER 99%, PR 2%, HER2 1+, Ki-67 25%, [facility] molecular testing: HER2 low, hormone receptor positive, ESR 1 mutation (no PIK3CA, no PALB2, no PD-L1, TMB 4)   Treatment plan: Consider switching treatment to Verzinio with Faslodex  Once imlunestrant gets approved then we will switch her to Verzinio with imlunestrant. She will come back in 1 week for appointment with Autry Legions for education. No further Enhertu  treatments after today.   No orders of the defined types were placed in this encounter.  The patient has a good understanding of the overall plan. she agrees with it. she will call with any problems that may develop before the next visit here. Total time spent: 40 mins  including face to face time and time spent for planning, charting and co-ordination of care   Viinay K Yi Falletta, MD 07/06/23

## 2023-07-06 NOTE — Progress Notes (Signed)
 Per MD okay to treat today with HR 117 and echo from 03/09/23.  Verbal orders received and placed to obtain repeat echo prior to next tx.

## 2023-07-06 NOTE — Patient Instructions (Signed)

## 2023-07-06 NOTE — Assessment & Plan Note (Signed)
 10/26/17: Left mastectomy: IDC grade 1, 2 foci largest spans 8.5 cm, intermediate grade DCIS, lymphovascular invasion identified, perineural invasion identified, 1/2 lymph nodes positive with extracapsular extension, ER 9200%, PR 5 to 50%, HER-2 negative, Ki-67 10 to 15%, T3N1A   Oncotype DX score 22, intermediate risk, chemotherapy not felt to have significant benefit.   Treatment Summary: 1. Antiestrogen therapy with anastrozole  1 mg daily started 07/15/2016 2. Mastectomy 10/26/2017, Mammaprint low risk luminal type A 3. Followed by adjuvant radiation 12/08/17- 01/26/18  4. Followed by adjuvant antiestrogen therapy anastrozole  started 01/17/2018 (originally started 07/15/2016) -------------------------------------------------------------------- Low back pain August 2023: Underwent CT myelogram: Large expansile lesion in the sacrum with extraosseous extension of the tumor, diffuse lytic lesions throughout the visualized spine with metastatic disease myeloma is considered less likely.  (This was ordered by Dr. Dayne Even)   Treatment plan: 1.  PET CT scan 12/27/2021: Widespread bone metastatic disease largest lesion involving the sacrum with pathological fractures of T6 and T10 retroperitoneal and pelvic lymph node metastasis, right axillary lymph node, activity in the pancreatic head, hypermetabolic activity in the adrenal glands, right thyroid  nodule. 2. biopsy of sacrum: 12/26/2021: Metastatic breast cancer, ER 90%, PR 10%, HER2 negative (0) 3.  Ibrance  along with Faslodex  started 12/25/2021-10/14/2022 4.  Xgeva  for bone metastases.  Every 3 months 5.  Palliative radiation to the sacrum completed 01/19/2022 ---------------------------------------------------------------------------------------------------------------------- Current treatment: Enhertu  started 10/21/2022, today is cycle 9 We plan to continue this treatment irrespective of her blood counts.   Enhertu  toxicities: Mild leukopenia and  anemia: Stable Mild peripheral neuropathy: Stable   Tolerated extremely well without any problems. Completed radiation   Liver function tests have started to improve.   PET/CT 10/29/2022: Overall improved appearance of bone lesions.  Stable right extra lymph node, retroperitoneal lymph nodes decreased activity CT head ordered by Dr. Mark Sil 11/26/2022: Extensive osseous mets on the skull, extraosseous soft tissue tumor in the right orbit appears improved, inseparable from anterior aspect of superior sagittal sinus and left sigmoid sinus.  Unchanged innumerable scalp nodules CT CAP 02/24/2023: Liver lesions diminished in size sclerosis of widespread bone metastases CT CAP 05/21/2023: Extensive bone metastases similar, low-attenuation liver lesion not clearly seen previously (liver MRI suggested)    Cutaneous metastases: I am concerned that the Enhertu  is not working very well on the cutaneous lesions.  Status post skin biopsy 06/14/2023: Poorly differentiated adenocarcinoma x 2 biopsies: ER 99%, PR 2%, HER2 1+, Ki-67 25%, [facility] molecular testing: HER2 low, hormone receptor positive, ESR 1 mutation (no PIK3CA, no PALB2, no PD-L1, TMB 4)   Treatment plan: Consider switching treatment to Verzinio with Faslodex  Alternate would be single agent Elacestrant versus chemotherapy

## 2023-07-07 ENCOUNTER — Encounter: Payer: Self-pay | Admitting: Hematology and Oncology

## 2023-07-07 ENCOUNTER — Telehealth: Payer: Self-pay | Admitting: Hematology and Oncology

## 2023-07-07 LAB — FERRITIN: Ferritin: 1307 ng/mL — ABNORMAL HIGH (ref 11–307)

## 2023-07-07 NOTE — Telephone Encounter (Signed)
 Confirmed with pt scheduled appt dates and times

## 2023-07-08 ENCOUNTER — Encounter: Payer: Self-pay | Admitting: Hematology and Oncology

## 2023-07-12 ENCOUNTER — Other Ambulatory Visit: Payer: Self-pay | Admitting: Hematology and Oncology

## 2023-07-12 MED ORDER — ABEMACICLIB 100 MG PO TABS
100.0000 mg | ORAL_TABLET | Freq: Two times a day (BID) | ORAL | 3 refills | Status: DC
  Filled 2023-07-13: qty 56, 28d supply, fill #0
  Filled 2023-07-30: qty 56, 28d supply, fill #1

## 2023-07-13 ENCOUNTER — Encounter: Payer: Self-pay | Admitting: Hematology and Oncology

## 2023-07-13 ENCOUNTER — Other Ambulatory Visit: Payer: Self-pay | Admitting: Pharmacy Technician

## 2023-07-13 ENCOUNTER — Telehealth: Payer: Self-pay | Admitting: Pharmacy Technician

## 2023-07-13 ENCOUNTER — Other Ambulatory Visit: Payer: Self-pay | Admitting: *Deleted

## 2023-07-13 ENCOUNTER — Other Ambulatory Visit (HOSPITAL_COMMUNITY): Payer: Self-pay

## 2023-07-13 ENCOUNTER — Other Ambulatory Visit: Payer: Self-pay

## 2023-07-13 DIAGNOSIS — C50512 Malignant neoplasm of lower-outer quadrant of left female breast: Secondary | ICD-10-CM

## 2023-07-13 NOTE — Progress Notes (Unsigned)
 [facility]       Telephone: [phone]?Fax: [phone]   Oncology Clinical Pharmacist Practitioner Initial Assessment  Kathryn Lucas is a 63 y.o. female with a diagnosis of breast cancer. They were contacted today via in-person visit.  Indication/Regimen Abemaciclib  (Verzenio ) is being used appropriately for treatment of metastatic breast cancer by Dr. Vinay Gudena.      Wt Readings from Last 1 Encounters:  07/14/23 152 lb 11.2 oz (69.3 kg)    Estimated body surface area is 1.85 meters squared as calculated from the following:   Height as of 07/06/23: 5\' 10"  (1.778 m).   Weight as of this encounter: 152 lb 11.2 oz (69.3 kg).  The dosing regimen is 100 mg by mouth every 12 hours on days 1 to 28 of a 28-day cycle. This is being given  in combination with fulvestrant  and denosumab  120 mg (Xgeva ) . It is planned to continue until disease progression or unacceptable toxicity. Prescription dose and frequency assessed for appropriateness.  Patient has agreed to treatment which is documented in physician note on 07/06/23. Counseled patient on administration, dosing, side effects, monitoring, drug-food interactions, safe handling, storage, and disposal.  Will start fulvestrant  today and come back in two weeks for dose 2 and see Dr. Gudena. Denosumab  was started on 12/25/21 and last given on 06/15/23. Next due 09/07/23. Enhertu  has been discontinued by Dr. Lee Public. Baseline labs for abemaciclib  were done on 07/06/23. Abemaciclib  should be delivered on 07/16/23 and Kathryn Lucas can start on 07/17/23.  She was having some burning sensations when urinating and UA came back with UC still pending. Will prescribe ciprofloxacin 500 mg by mouth every 12 hours x 3 days. This has been send to her local pharmacy of choice. She will call if worsening symptoms.   Dose Modifications Abemaciclib  will be at a reduced dose of 100 mg by mouth every 12 hours per Dr. Lee Public.  Access Assessment Kathryn Lucas will be receiving abemaciclib  through [facility] Concerns: None Start date if known: 07/17/23  Adherence Assessment Reviewed importance on keeping a med schedule and plan for any missed doses Barriers to adherence identified? No  Allergies Allergies  Allergen Reactions   Percocet [Oxycodone -Acetaminophen ] Nausea And Vomiting    Severe GI upset and vomiting    Vitals    07/14/2023    1:02 PM 07/06/2023    3:35 PM 07/06/2023    2:00 PM  Oncology Vitals  Height   178 cm  Weight 69.264 kg  69.99 kg  Weight (lbs) 152 lbs 11 oz  154 lbs 5 oz  BMI 21.91 kg/m2  22.14 kg/m2  Temp 98.1 F (36.7 C)  98.2 F (36.8 C)  Pulse Rate 113 117 126  BP 128/78  100/80  Resp 17  18  SpO2 100 % 100 % 100 %  BSA (m2) 1.85 m2  1.86 m2     Laboratory Data    Latest Ref Rng & Units 07/06/2023    2:20 PM 06/15/2023    2:21 PM 05/25/2023    2:31 PM  CBC EXTENDED  WBC 4.0 - 10.5 K/uL 3.1  3.4  3.2   RBC 3.87 - 5.11 MIL/uL 3.64  3.56  3.65   Hemoglobin 12.0 - 15.0 g/dL 29.5  62.1  30.8   HCT 36.0 - 46.0 % 36.3  36.2  36.7   Platelets 150 - 400 K/uL 192  195  209   NEUT# 1.7 - 7.7  K/uL 2.0  2.3  2.2   Lymph# 0.7 - 4.0 K/uL 0.4  0.5  0.4        Latest Ref Rng & Units 07/06/2023    2:20 PM 06/15/2023    2:21 PM 05/25/2023    2:31 PM  CMP  Glucose 70 - 99 mg/dL 952  89  841   BUN 8 - 23 mg/dL 13  15  14    Creatinine 0.44 - 1.00 mg/dL 3.24  4.01  0.27   Sodium 135 - 145 mmol/L 138  137  137   Potassium 3.5 - 5.1 mmol/L 3.9  4.1  3.9   Chloride 98 - 111 mmol/L 106  107  106   CO2 22 - 32 mmol/L 25  25  26    Calcium  8.9 - 10.3 mg/dL 8.7  9.0  8.6   Total Protein 6.5 - 8.1 g/dL 6.4  6.3  6.2   Total Bilirubin 0.0 - 1.2 mg/dL 0.5  0.4  0.4   Alkaline Phos 38 - 126 U/L 129  105  109   AST 15 - 41 U/L 76  71  54   ALT 0 - 44 U/L 17  13  13     Contraindications Contraindications were reviewed? Yes Contraindications to therapy were identified? No   Safety  Precautions The following safety precautions for the use of abemaciclib  were reviewed:  Changes in kidney function: importance of drinking plenty of fluids and monitoring urine output Diarrhea: we reviewed that diarrhea is common with abemaciclib  and confirmed that she does have loperamide (Imodium) at home.  We reviewed how to take this medication PRN and gave her information on abemaciclib  Decreased white blood cells (WBCs) and increased risk for infection: we discussed the importance of having a thermometer and what the Centers for Disease Control and Prevention (CDC) considers a fever which is 100.53F (38C) or higher.  Gave patient 24/7 triage line to call if any fevers or symptoms Decreased hemoglobin, part of red blood cells that carry iron and oxygen Fatigue Nausea and Vomiting Hepatotoxicity: reviewed to contact clinic for RUQ pain that will not subside, yellowing of eyes/skin Decreased appetite or weight loss Abdominal pain Decreased platelet count and increased risk for bleeding Venous thromboembolism (VTE): reviewed signs of deep vein thrombosis (DVT) such as leg swelling, redness, pain, or tenderness and signs of pulmonary embolism (PE) such as shortness of breath, rapid or irregular heartbeat, cough, chest pain, or lightheadedness ILD/Pneumonitis: we reviewed potential symptoms including cough, shortness, and fatigue. Handling body fluids and waste Pregnancy, sexual activity, and contraception Avoid grapefruit products Reviewed to take the medication every 12 hours (with food sometimes can be easier on the stomach) and to take it at the same time every day. Discussed proper storage and handling of abemaciclib   Medication Reconciliation Current Outpatient Medications  Medication Sig Dispense Refill   Calcium  500-100 MG-UNIT CHEW Chew 1 tablet by mouth daily. 60 tablet    cholecalciferol (VITAMIN D3) 25 MCG (1000 UNIT) tablet Take 1 tablet (1,000 Units total) by mouth daily.      ciprofloxacin (CIPRO) 500 MG tablet Take 1 tablet (500 mg total) by mouth 2 (two) times daily for 3 days. 6 tablet 0   levothyroxine  (SYNTHROID ) 125 MCG tablet Take 1 tablet (125 mcg total) by mouth daily before breakfast. 90 tablet 0   omeprazole  (PRILOSEC) 20 MG capsule TAKE 1 CAPSULE BY MOUTH ONCE DAILY 30 capsule 3   vitamin C  (ASCORBIC ACID) 250 MG tablet Take 1  tablet (250 mg total) by mouth daily.     Zinc  Acetate, Oral, (ZINC  ACETATE PO) Take by mouth.     abemaciclib  (VERZENIO ) 100 MG tablet Take 1 tablet (100 mg total) by mouth 2 (two) times daily. (Patient not taking: Reported on 07/14/2023) 60 tablet 3   furosemide  (LASIX ) 20 MG tablet Take 1 tablet (20 mg total) by mouth daily. If needed may take 1/2 tab q am (Patient not taking: Reported on 07/14/2023) 30 tablet 0   lidocaine -prilocaine  (EMLA ) cream Apply 1 Application topically as needed. Apply one hour prior to port a cath access (Patient not taking: Reported on 07/14/2023) 30 g 1   LORazepam  (ATIVAN ) 0.5 MG tablet Take 1 tablet (0.5 mg total) by mouth at bedtime. (Patient not taking: Reported on 05/25/2023) 30 tablet 0   No current facility-administered medications for this visit.    Medication reconciliation is based on the patient's most recent medication list in the electronic medical record (EMR) including herbal products and OTC medications.   The patient's medication list was reviewed today with the patient? Yes   Drug-drug interactions (DDIs) DDIs were evaluated? Yes Significant DDIs identified? No   Drug-Food Interactions Drug-food interactions were evaluated? Yes Drug-food interactions identified? Grapefruit products  Follow-up Plan  Patient education handout given to patient Start abemaciclib  100 mg by mouth every 12 hours once received Start fulvestrant  500 mg IM today, then every 2 weeks x 2 doses, then every 4 weeks thereafter Start ciprofloxacin 500 mg by mouth every 12 hours x 3 days empirically for suspected  UTI (burning, frequency). UC pending. Continue denosumab  120 mg SubQ every 12 weeks. Due next on 09/07/23 Will see Dr. Lee Public with labs and fulvestrant  # 2 on 07/28/23 Will see clinical pharmacy with labs and fulvestrant  #3 on 08/11/23  Kathryn Lucas participated in the discussion, expressed understanding, and voiced agreement with the above plan. All questions were answered to her satisfaction. The patient was advised to contact the clinic at [phone] with any questions or concerns prior to her return visit.   I spent 60 minutes assessing the patient.  Selam Pietsch A. Webb Hake, PharmD, BCOP, CPP  Althea Atkinson, RPH-CPP, 07/14/2023 1:38 PM  **Disclaimer: This note was dictated with voice recognition software. Similar sounding words can inadvertently be transcribed and this note may contain transcription errors which may not have been corrected upon publication of note.**

## 2023-07-13 NOTE — Progress Notes (Unsigned)
 Specialty Pharmacy Initial Fill Coordination Note  Kathryn Lucas is a 63 y.o. female contacted today regarding refills of specialty medication(s) Abemaciclib  (VERZENIO ) .  Patient requested Delivery  on 07/16/23  to verified address [address] [place] [address]   Medication will be filled on 07/14/23.   Patient is aware of $0 copayment.

## 2023-07-13 NOTE — Telephone Encounter (Addendum)
 Oral Oncology Patient Advocate Encounter  After completing a benefits investigation, prior authorization for Verzenio  is not required at this time through [facility].  Patient's copay is $0 for a 28 day supply. Ins will only allow up to a 34 day supply at a time.    Roda Cirri, CPhT Specialty Pharmacy Patient Advocate Phone: [phone] Fax: [phone]

## 2023-07-14 ENCOUNTER — Inpatient Hospital Stay

## 2023-07-14 ENCOUNTER — Encounter: Payer: Self-pay | Admitting: Hematology and Oncology

## 2023-07-14 ENCOUNTER — Other Ambulatory Visit

## 2023-07-14 ENCOUNTER — Inpatient Hospital Stay: Admitting: Pharmacist

## 2023-07-14 ENCOUNTER — Other Ambulatory Visit: Payer: Self-pay

## 2023-07-14 VITALS — BP 128/78 | HR 113 | Temp 98.1°F | Resp 17 | Wt 152.7 lb

## 2023-07-14 DIAGNOSIS — Z170 Estrogen receptor positive status [ER+]: Secondary | ICD-10-CM

## 2023-07-14 DIAGNOSIS — C799 Secondary malignant neoplasm of unspecified site: Secondary | ICD-10-CM

## 2023-07-14 DIAGNOSIS — Z5112 Encounter for antineoplastic immunotherapy: Secondary | ICD-10-CM | POA: Diagnosis not present

## 2023-07-14 DIAGNOSIS — E039 Hypothyroidism, unspecified: Secondary | ICD-10-CM

## 2023-07-14 LAB — URINALYSIS, COMPLETE (UACMP) WITH MICROSCOPIC
Bilirubin Urine: NEGATIVE
Glucose, UA: NEGATIVE mg/dL
Ketones, ur: NEGATIVE mg/dL
Nitrite: POSITIVE — AB
Protein, ur: 100 mg/dL — AB
Specific Gravity, Urine: 1.016 (ref 1.005–1.030)
WBC, UA: >50 WBC/hpf (ref 0–5)
pH: 6 (ref 5.0–8.0)

## 2023-07-14 MED ORDER — FULVESTRANT 250 MG/5ML IM SOSY
500.0000 mg | PREFILLED_SYRINGE | Freq: Once | INTRAMUSCULAR | Status: AC
  Administered 2023-07-14: 500 mg via INTRAMUSCULAR
  Filled 2023-07-14: qty 10

## 2023-07-14 MED ORDER — CIPROFLOXACIN HCL 500 MG PO TABS: 500.0000 mg | ORAL_TABLET | Freq: Two times a day (BID) | ORAL | 0 refills | Status: AC

## 2023-07-14 MED ORDER — SODIUM CHLORIDE 0.9% FLUSH
10.0000 mL | Freq: Once | INTRAVENOUS | Status: AC
  Administered 2023-07-14: 10 mL

## 2023-07-14 MED ORDER — HEPARIN SOD (PORK) LOCK FLUSH 100 UNIT/ML IV SOLN
500.0000 [IU] | Freq: Once | INTRAVENOUS | Status: AC
  Administered 2023-07-14: 500 [IU]

## 2023-07-14 NOTE — Addendum Note (Signed)
 Addended by: Olita Best T on: 07/14/2023 03:13 PM   Modules accepted: Orders

## 2023-07-14 NOTE — Progress Notes (Signed)
 Patient counseled in clinic in-person visit note on 07/14/23

## 2023-07-15 ENCOUNTER — Encounter: Payer: Self-pay | Admitting: Hematology and Oncology

## 2023-07-15 ENCOUNTER — Encounter: Payer: Self-pay | Admitting: Pharmacist

## 2023-07-15 NOTE — Telephone Encounter (Signed)
 Patient successfully OnBoarded and drug education provided by pharmacist. Medication scheduled to be shipped on 5/1 /25 for delivery on 07/16/23 from [facility] to patient's address. Patient also knows to call me at [phone] with any questions or concerns regarding receiving medication or if there is any unexpected change in co-pay.

## 2023-07-16 LAB — URINE CULTURE: Culture: >=100000 — AB

## 2023-07-20 ENCOUNTER — Other Ambulatory Visit (HOSPITAL_COMMUNITY)

## 2023-07-20 ENCOUNTER — Ambulatory Visit (HOSPITAL_COMMUNITY)
Admission: RE | Admit: 2023-07-20 | Discharge: 2023-07-20 | Disposition: A | Discharge status: Home / Self Care | Source: Ambulatory Visit | Attending: Hematology and Oncology | Admitting: Hematology and Oncology

## 2023-07-20 DIAGNOSIS — Z79899 Other long term (current) drug therapy: Secondary | ICD-10-CM | POA: Insufficient documentation

## 2023-07-20 DIAGNOSIS — Z5181 Encounter for therapeutic drug level monitoring: Secondary | ICD-10-CM | POA: Insufficient documentation

## 2023-07-21 LAB — ECHOCARDIOGRAM COMPLETE
Area-P 1/2: 6.65 cm2
Calc EF: 48.9 %
S' Lateral: 2.2 cm
Single Plane A2C EF: 40.8 %
Single Plane A4C EF: 50.7 %

## 2023-07-26 ENCOUNTER — Other Ambulatory Visit: Payer: Self-pay

## 2023-07-26 DIAGNOSIS — Z170 Estrogen receptor positive status [ER+]: Secondary | ICD-10-CM

## 2023-07-27 ENCOUNTER — Inpatient Hospital Stay: Payer: Medicaid Other | Attending: Hematology and Oncology | Admitting: Hematology and Oncology

## 2023-07-27 ENCOUNTER — Inpatient Hospital Stay

## 2023-07-27 ENCOUNTER — Inpatient Hospital Stay: Payer: Medicaid Other

## 2023-07-27 ENCOUNTER — Ambulatory Visit: Payer: Medicaid Other

## 2023-07-27 ENCOUNTER — Other Ambulatory Visit: Payer: Self-pay | Admitting: Hematology and Oncology

## 2023-07-27 VITALS — BP 122/74 | HR 118 | Temp 97.9°F | Resp 18 | Ht 70.0 in | Wt 154.2 lb

## 2023-07-27 DIAGNOSIS — Z9221 Personal history of antineoplastic chemotherapy: Secondary | ICD-10-CM | POA: Insufficient documentation

## 2023-07-27 DIAGNOSIS — R5383 Other fatigue: Secondary | ICD-10-CM | POA: Diagnosis not present

## 2023-07-27 DIAGNOSIS — Z5112 Encounter for antineoplastic immunotherapy: Secondary | ICD-10-CM | POA: Diagnosis present

## 2023-07-27 DIAGNOSIS — R197 Diarrhea, unspecified: Secondary | ICD-10-CM | POA: Diagnosis not present

## 2023-07-27 DIAGNOSIS — Z79899 Other long term (current) drug therapy: Secondary | ICD-10-CM | POA: Diagnosis not present

## 2023-07-27 DIAGNOSIS — C50512 Malignant neoplasm of lower-outer quadrant of left female breast: Secondary | ICD-10-CM | POA: Diagnosis present

## 2023-07-27 DIAGNOSIS — C7951 Secondary malignant neoplasm of bone: Secondary | ICD-10-CM | POA: Diagnosis present

## 2023-07-27 DIAGNOSIS — Z170 Estrogen receptor positive status [ER+]: Secondary | ICD-10-CM

## 2023-07-27 DIAGNOSIS — C799 Secondary malignant neoplasm of unspecified site: Secondary | ICD-10-CM

## 2023-07-27 DIAGNOSIS — Z923 Personal history of irradiation: Secondary | ICD-10-CM | POA: Insufficient documentation

## 2023-07-27 DIAGNOSIS — Z9012 Acquired absence of left breast and nipple: Secondary | ICD-10-CM | POA: Insufficient documentation

## 2023-07-27 DIAGNOSIS — Z79811 Long term (current) use of aromatase inhibitors: Secondary | ICD-10-CM | POA: Insufficient documentation

## 2023-07-27 LAB — CBC WITH DIFFERENTIAL (CANCER CENTER ONLY)
Abs Immature Granulocytes: 0.01 10*3/uL (ref 0.00–0.07)
Basophils Absolute: 0 10*3/uL (ref 0.0–0.1)
Basophils Relative: 1 %
Eosinophils Absolute: 0.1 10*3/uL (ref 0.0–0.5)
Eosinophils Relative: 3 %
HCT: 35.6 % — ABNORMAL LOW (ref 36.0–46.0)
Hemoglobin: 12 g/dL (ref 12.0–15.0)
Immature Granulocytes: 0 %
Lymphocytes Relative: 14 %
Lymphs Abs: 0.4 10*3/uL — ABNORMAL LOW (ref 0.7–4.0)
MCH: 34 pg (ref 26.0–34.0)
MCHC: 33.7 g/dL (ref 30.0–36.0)
MCV: 100.8 fL — ABNORMAL HIGH (ref 80.0–100.0)
Monocytes Absolute: 0.2 10*3/uL (ref 0.1–1.0)
Monocytes Relative: 6 %
Neutro Abs: 2.1 10*3/uL (ref 1.7–7.7)
Neutrophils Relative %: 76 %
Platelet Count: 187 10*3/uL (ref 150–400)
RBC: 3.53 MIL/uL — ABNORMAL LOW (ref 3.87–5.11)
RDW: 17.3 % — ABNORMAL HIGH (ref 11.5–15.5)
WBC Count: 2.7 10*3/uL — ABNORMAL LOW (ref 4.0–10.5)
nRBC: 0 % (ref 0.0–0.2)

## 2023-07-27 LAB — IRON AND IRON BINDING CAPACITY (CC-WL,HP ONLY)
Iron: 80 ug/dL (ref 28–170)
Saturation Ratios: 26 % (ref 10.4–31.8)
TIBC: 308 ug/dL (ref 250–450)
UIBC: 228 ug/dL (ref 148–442)

## 2023-07-27 LAB — CMP (CANCER CENTER ONLY)
ALT: 17 U/L (ref 0–44)
AST: 44 U/L — ABNORMAL HIGH (ref 15–41)
Albumin: 3.7 g/dL (ref 3.5–5.0)
Alkaline Phosphatase: 123 U/L (ref 38–126)
Anion gap: 5 (ref 5–15)
BUN: 13 mg/dL (ref 8–23)
CO2: 26 mmol/L (ref 22–32)
Calcium: 9 mg/dL (ref 8.9–10.3)
Chloride: 107 mmol/L (ref 98–111)
Creatinine: 0.89 mg/dL (ref 0.44–1.00)
GFR, Estimated: >60 mL/min (ref >60)
Glucose, Bld: 107 mg/dL — ABNORMAL HIGH (ref 70–99)
Potassium: 3.9 mmol/L (ref 3.5–5.1)
Sodium: 138 mmol/L (ref 135–145)
Total Bilirubin: 0.4 mg/dL (ref 0.0–1.2)
Total Protein: 6.3 g/dL — ABNORMAL LOW (ref 6.5–8.1)

## 2023-07-27 LAB — T4, FREE: Free T4: 1.24 ng/dL — ABNORMAL HIGH (ref 0.61–1.12)

## 2023-07-27 LAB — VITAMIN B12: Vitamin B-12: 2862 pg/mL — ABNORMAL HIGH (ref 180–914)

## 2023-07-27 MED ORDER — HEPARIN SOD (PORK) LOCK FLUSH 100 UNIT/ML IV SOLN
500.0000 [IU] | Freq: Once | INTRAVENOUS | Status: AC
Start: 2023-07-27 — End: 2023-07-27
  Administered 2023-07-27: 500 [IU]

## 2023-07-27 MED ORDER — SODIUM CHLORIDE 0.9% FLUSH
10.0000 mL | Freq: Once | INTRAVENOUS | Status: AC
  Administered 2023-07-27: 10 mL

## 2023-07-27 MED ORDER — FULVESTRANT 250 MG/5ML IM SOSY
500.0000 mg | PREFILLED_SYRINGE | Freq: Once | INTRAMUSCULAR | Status: AC
  Administered 2023-07-27: 500 mg via INTRAMUSCULAR
  Filled 2023-07-27: qty 10

## 2023-07-27 NOTE — Progress Notes (Signed)
 Patient Care Team: Elester Grim, MD as PCP - General (Internal Medicine) Cameron Cea, MD as Consulting Physician (Hematology and Oncology) Lockie Rima, MD as Consulting Physician (General Surgery) Johna Myers, MD as Consulting Physician (Radiation Oncology) Glenis Langdon, MD as Consulting Physician (Radiation Oncology)  DIAGNOSIS:  Encounter Diagnosis  Name Primary?   Malignant neoplasm of lower-outer quadrant of left breast of female, estrogen receptor positive (HCC) Yes    SUMMARY OF ONCOLOGIC HISTORY: Oncology History  Malignant neoplasm of lower-outer quadrant of left breast of female, estrogen receptor positive (HCC)  06/11/2016 Mammogram   Palpable left breast masses 3:00 position: 2.2 cm; 5:30 position: 2.5 cm; 6:30 position: 0.7 cm   06/19/2016 Initial Diagnosis   Left breast biopsy 3:30: IDC with DCIS grade 1, ER 90%, PR 50%, Ki-67 15%, HER-2 negative ratio 1.13; biopsy 5:30 position: IDC grade 1   07/13/2016 Breast MRI   Large area of abnormal enhancement lower inner and lower outer quadrants left breast spanning 9 cm x 6.4 cm x 5.3 cm, no abnormal enlarged lymph nodes; T3 N0 stage II a (New AJCC staging)    07/15/2016 - 12/08/2017 Anti-estrogen oral therapy   Neoadjuvant anastrozole  1 mg daily   07/17/2016 Oncotype testing   Testing done on the biopsy: Oncotype DX score 22, intermediate risk   02/02/2017 Breast MRI   Left breast multicentric disease unchanged measuring 2.7 x 1.6 cm.  Mass in the non-mass enhancement are also not significantly changed measuring 6.2 x 2.4 cm. new enhancing mass within the outer right breast 7 mm which could be fat necrosis or inclusion cyst    02/09/2017 Imaging   Ultrasound of the right breast lesion noted on MRI: No sonographic finding corresponds to the abnormality noted on MRI   07/13/2017 Cancer Staging   Staging form: Breast, AJCC 8th Edition - Clinical stage from 07/13/2017: Stage IIA (cT3, cN0, cM0, G1, ER+, PR+, HER2-) -  Signed by Percival Brace, NP on 05/18/2018   10/26/2017 Surgery   Left mastectomy: IDC grade 1, 2 foci largest spans 8.5 cm, intermediate grade DCIS, lymphovascular invasion identified, perineural invasion identified, 1/2 lymph nodes positive with extracapsular extension, ER 9200%, PR 5 to 50%, HER-2 negative, Ki-67 10 to 15%, T3N1A Mammaprint: low risk   11/02/2017 Cancer Staging   Staging form: Breast, AJCC 8th Edition - Pathologic: No Stage Recommended (ypT3, pN1a, cM0, G1, ER+, PR+, HER2-) - Signed by Cameron Cea, MD on 11/02/2017   12/08/2017 - 01/26/2018 Radiation Therapy   Adjuvant radiation therapy    02/2018 -  Anti-estrogen oral therapy   Anastrozole  1 mg daily adjuvant therapy   10/21/2022 - 07/06/2023 Chemotherapy   Patient is on Treatment Plan : BREAST METASTATIC Fam-Trastuzumab Deruxtecan-nxki  (Enhertu ) (5.4) q21d       CHIEF COMPLIANT: Follow-up of metastatic breast cancer on Verzinio with the Faslodex   HISTORY OF PRESENT ILLNESS:   History of Present Illness Kathryn Lucas is a 63 year old female who presents for follow-up and treatment with Verzenio .  She started Verzenio  two weeks ago, taking the full dose twice daily. She tolerates the medication well, with manageable diarrhea and expected fatigue. She is relieved not to have significant diarrhea.  Her bowel movements are regular, with no diarrhea. She is concerned about the potential for diarrhea to develop later.  Blood work shows improvement, with satisfactory white blood cell count and neutrophils, and improved liver function tests.     ALLERGIES:  is allergic to percocet [oxycodone -acetaminophen ].  MEDICATIONS:  Current Outpatient Medications  Medication Sig Dispense Refill   Calcium  500-100 MG-UNIT CHEW Chew 1 tablet by mouth daily. 60 tablet    cholecalciferol (VITAMIN D3) 25 MCG (1000 UNIT) tablet Take 1 tablet (1,000 Units total) by mouth daily.     levothyroxine  (SYNTHROID ) 125 MCG tablet  Take 1 tablet (125 mcg total) by mouth daily before breakfast. 90 tablet 0   vitamin C  (ASCORBIC ACID) 250 MG tablet Take 1 tablet (250 mg total) by mouth daily.     Zinc  Acetate, Oral, (ZINC  ACETATE PO) Take by mouth.     abemaciclib  (VERZENIO ) 100 MG tablet Take 1 tablet (100 mg total) by mouth 2 (two) times daily. (Patient not taking: Reported on 07/27/2023) 60 tablet 3   furosemide  (LASIX ) 20 MG tablet Take 1 tablet (20 mg total) by mouth daily. If needed may take 1/2 tab q am (Patient not taking: Reported on 07/27/2023) 30 tablet 0   lidocaine -prilocaine  (EMLA ) cream Apply 1 Application topically as needed. Apply one hour prior to port a cath access (Patient not taking: Reported on 07/27/2023) 30 g 1   LORazepam  (ATIVAN ) 0.5 MG tablet Take 1 tablet (0.5 mg total) by mouth at bedtime. (Patient not taking: Reported on 07/27/2023) 30 tablet 0   omeprazole  (PRILOSEC) 20 MG capsule TAKE 1 CAPSULE BY MOUTH ONCE DAILY 30 capsule 3   No current facility-administered medications for this visit.    PHYSICAL EXAMINATION: ECOG PERFORMANCE STATUS: 1 - Symptomatic but completely ambulatory  Vitals:   07/27/23 1443  BP: 122/74  Pulse: (!) 118  Resp: 18  Temp: 97.9 F (36.6 C)  SpO2: 100%   Filed Weights   07/27/23 1443  Weight: 154 lb 3.2 oz (69.9 kg)      LABORATORY DATA:  I have reviewed the data as listed    Latest Ref Rng & Units 07/27/2023    2:15 PM 07/06/2023    2:20 PM 06/15/2023    2:21 PM  CMP  Glucose 70 - 99 mg/dL 161  096  89   BUN 8 - 23 mg/dL 13  13  15    Creatinine 0.44 - 1.00 mg/dL 0.45  4.09  8.11   Sodium 135 - 145 mmol/L 138  138  137   Potassium 3.5 - 5.1 mmol/L 3.9  3.9  4.1   Chloride 98 - 111 mmol/L 107  106  107   CO2 22 - 32 mmol/L 26  25  25    Calcium  8.9 - 10.3 mg/dL 9.0  8.7  9.0   Total Protein 6.5 - 8.1 g/dL 6.3  6.4  6.3   Total Bilirubin 0.0 - 1.2 mg/dL 0.4  0.5  0.4   Alkaline Phos 38 - 126 U/L 123  129  105   AST 15 - 41 U/L 44  76  71   ALT 0 - 44  U/L 17  17  13      Lab Results  Component Value Date   WBC 2.7 (L) 07/27/2023   HGB 12.0 07/27/2023   HCT 35.6 (L) 07/27/2023   MCV 100.8 (H) 07/27/2023   PLT 187 07/27/2023   NEUTROABS 2.1 07/27/2023    ASSESSMENT & PLAN:  Malignant neoplasm of lower-outer quadrant of left breast of female, estrogen receptor positive (HCC) 10/26/17: Left mastectomy: IDC grade 1, 2 foci largest spans 8.5 cm, intermediate grade DCIS, lymphovascular invasion identified, perineural invasion identified, 1/2 lymph nodes positive with extracapsular extension, ER 9200%, PR 5 to 50%, HER-2 negative, Ki-67 10  to 15%, T3N1A   Oncotype DX score 22, intermediate risk, chemotherapy not felt to have significant benefit.   Treatment Summary: 1. Antiestrogen therapy with anastrozole  1 mg daily started 07/15/2016 2. Mastectomy 10/26/2017, Mammaprint low risk luminal type A 3. Followed by adjuvant radiation 12/08/17- 01/26/18  4. Followed by adjuvant antiestrogen therapy anastrozole  started 01/17/2018 (originally started 07/15/2016) 5.  August 2023: Low back pain: Large lesion in the sacrum biopsy of sacrum: 12/26/2021: Metastatic breast cancer, ER 90%, PR 10%, HER2 negative (0) 6. Ibrance  along with Faslodex  started 12/25/2021-10/14/2022 7. Palliative radiation to the sacrum completed 01/19/2022 8.  Enhertu  started 10/21/2022-07/06/2023 9.  Skin biopsy: 06/14/2023: Poorly differentiated adenocarcinoma x 2 biopsies: ER 99%, PR 2%, HER2 1+, Ki-67 25%, Caris molecular testing: HER2 low, hormone receptor positive, ESR 1 mutation (no PIK3CA, no PALB2, no PD-L1, TMB 4) --------------------------------------------------------------------   Current treatment: Verzinio with Faslodex  started 07/16/2023 CT CAP 05/21/2023: Extensive bone metastases similar, low-attenuation liver lesion not clearly seen previously (liver MRI suggested)  Verzinio toxicities: Tolerating fairly well.  Intermittent loose stools but not causing  concern. Fatigue Blood counts have been reviewed.  Although the WBC count is low but neutrophil count is stable. Return to clinic in 2 weeks for labs and follow-up    ------------------------------------- Assessment and Plan Assessment & Plan Malignant neoplasm of lower-outer quadrant of left breast, estrogen receptor positive On Verzenio  for estrogen receptor-positive breast cancer. Tolerating well with manageable side effects. Blood work stable. Optimistic for long-term disease control. - Continue Verzenio  at current dose. - Monitor blood work for white blood cell count, neutrophils, and liver function. - Encourage hydration. - Discuss long-term disease control potential.  Cutaneous metastases Monitored with no acute changes or concerns.  Follow-up Scheduled for follow-up and lab tests to monitor condition and treatment response. - Schedule follow-up in two weeks for treatment and evaluation. - Review pending lab results for thyroid , B12, and iron.      No orders of the defined types were placed in this encounter.  The patient has a good understanding of the overall plan. she agrees with it. she will call with any problems that may develop before the next visit here. Total time spent: 30 mins including face to face time and time spent for planning, charting and co-ordination of care   Viinay K Jansel Vonstein, MD 07/27/23

## 2023-07-27 NOTE — Assessment & Plan Note (Addendum)
 10/26/17: Left mastectomy: IDC grade 1, 2 foci largest spans 8.5 cm, intermediate grade DCIS, lymphovascular invasion identified, perineural invasion identified, 1/2 lymph nodes positive with extracapsular extension, ER 9200%, PR 5 to 50%, HER-2 negative, Ki-67 10 to 15%, T3N1A   Oncotype DX score 22, intermediate risk, chemotherapy not felt to have significant benefit.   Treatment Summary: 1. Antiestrogen therapy with anastrozole  1 mg daily started 07/15/2016 2. Mastectomy 10/26/2017, Mammaprint low risk luminal type A 3. Followed by adjuvant radiation 12/08/17- 01/26/18  4. Followed by adjuvant antiestrogen therapy anastrozole  started 01/17/2018 (originally started 07/15/2016) 5.  August 2023: Low back pain: Large lesion in the sacrum biopsy of sacrum: 12/26/2021: Metastatic breast cancer, ER 90%, PR 10%, HER2 negative (0) 6. Ibrance  along with Faslodex  started 12/25/2021-10/14/2022 7. Palliative radiation to the sacrum completed 01/19/2022 8.  Enhertu  started 10/21/2022-07/06/2023 9.  Skin biopsy: 06/14/2023: Poorly differentiated adenocarcinoma x 2 biopsies: ER 99%, PR 2%, HER2 1+, Ki-67 25%, Caris molecular testing: HER2 low, hormone receptor positive, ESR 1 mutation (no PIK3CA, no PALB2, no PD-L1, TMB 4) --------------------------------------------------------------------   Current treatment: Verzinio (May 2nd 2025) with Faslodex  CT CAP 05/21/2023: Extensive bone metastases similar, low-attenuation liver lesion not clearly seen previously (liver MRI suggested)  Return to clinic in 2 weeks for labs and follow-up

## 2023-07-28 ENCOUNTER — Other Ambulatory Visit: Payer: Self-pay

## 2023-07-28 LAB — TSH: TSH: 1.12 u[IU]/mL (ref 0.350–4.500)

## 2023-07-28 LAB — FERRITIN: Ferritin: 1322 ng/mL — ABNORMAL HIGH (ref 11–307)

## 2023-07-30 ENCOUNTER — Other Ambulatory Visit: Payer: Self-pay | Admitting: Pharmacy Technician

## 2023-07-30 ENCOUNTER — Other Ambulatory Visit: Payer: Self-pay

## 2023-07-30 ENCOUNTER — Other Ambulatory Visit (HOSPITAL_COMMUNITY): Payer: Self-pay

## 2023-07-30 ENCOUNTER — Encounter: Payer: Self-pay | Admitting: Pharmacist

## 2023-07-30 NOTE — Progress Notes (Signed)
 Specialty Pharmacy Refill Coordination Note  Kathryn Lucas is a 63 y.o. female contacted today regarding refills of specialty medication(s) Abemaciclib  (VERZENIO )   Patient requested Delivery   Delivery date: 08/13/23   Verified address: [address] [place] [address]   Medication will be filled on 08/12/23.

## 2023-08-02 ENCOUNTER — Other Ambulatory Visit: Payer: Self-pay | Admitting: Hematology and Oncology

## 2023-08-02 ENCOUNTER — Other Ambulatory Visit: Payer: Self-pay

## 2023-08-04 ENCOUNTER — Inpatient Hospital Stay

## 2023-08-04 ENCOUNTER — Other Ambulatory Visit

## 2023-08-04 ENCOUNTER — Inpatient Hospital Stay: Attending: Hematology and Oncology | Admitting: Hematology and Oncology

## 2023-08-04 ENCOUNTER — Other Ambulatory Visit: Payer: Self-pay

## 2023-08-05 ENCOUNTER — Other Ambulatory Visit (HOSPITAL_COMMUNITY): Payer: Self-pay

## 2023-08-05 ENCOUNTER — Other Ambulatory Visit: Payer: Self-pay

## 2023-08-05 NOTE — Progress Notes (Signed)
 Specialty Pharmacy Ongoing Clinical Assessment Note  Kathryn Lucas is a 63 y.o. female who is being followed by the specialty pharmacy service for RxSp Oncology   Patient's specialty medication(s) reviewed today: Abemaciclib  (VERZENIO )   Missed doses in the last 4 weeks: 0   Patient/Caregiver did not have any additional questions or concerns.   Therapeutic benefit summary: Patient is achieving benefit   Adverse events/side effects summary: Experienced adverse events/side effects (some flatulence, some fatigue, tolerable at this time)   Patient's therapy is appropriate to: Continue    Goals Addressed             This Visit's Progress    Maintain optimal adherence to therapy   No change    Patient is initiating therapy. Patient will maintain adherence         Follow up: 3 months  Malachi Screws Specialty Pharmacist

## 2023-08-10 ENCOUNTER — Encounter: Payer: Self-pay | Admitting: Pharmacist

## 2023-08-10 ENCOUNTER — Encounter: Payer: Self-pay | Admitting: Hematology and Oncology

## 2023-08-10 NOTE — Progress Notes (Unsigned)
 [facility]       Telephone: [phone]?Fax: [phone]   Oncology Clinical Pharmacist Practitioner Progress Note  Kathryn Lucas was contacted via in-person to discuss her chemotherapy regimen for abemaciclib  which they receive under the care of Dr. Vinay Gudena.  Current treatment regimen and start date Abemaciclib  (07/17/23) Fulvestrant  (07/04/23) Denosumab  120 mg (12/25/21)  Interval History She continues on abemaciclib  100 mg by mouth every 12 hours on days 1 to 28 of a 28-day cycle. This is being given in combination with fulvestrant  and denosumab  120 mg. Therapy is planned to continue until disease progression or unacceptable toxicity. She was last seen by clinical pharmacy on 07/14/23 and Dr. Lee Public on 07/27/23. She is next due for denosumab  120 mg on 09/07/23 which she receives every 12 weeks.  Response to Therapy Recently started having more loose stools. Educated on how take loperamide. Today she states the loose stool has resolved with taking loperamide. She is not having any other side effects related to abemaciclib  at this time. She is due for fulvestrant  #3 today. She will see Dr. Lee Public with labs in 2 weeks and clinical pharmacy in 4 weeks when she is due for denosumab  120 mg and fulvestrant .  Corrected calcium  is 9.2 mg/dL (albumin 3.5). AST up slightly to 47 U/L (44 last visit). TSH/T4 not resulted at the time of this dictation but will go directly to Dr. Gudena. Labs, vitals, treatment parameters, and manufacturer guidelines assessing toxicity were reviewed with Lacey Pian today. Based on these values, patient is in agreement to continue abemaciclib  therapy at this time.  Allergies Allergies  Allergen Reactions   Percocet [Oxycodone -Acetaminophen ] Nausea And Vomiting    Severe GI upset and vomiting   Vitals    08/11/2023    1:21 PM 07/27/2023    2:43 PM 07/14/2023    1:02 PM  Oncology Vitals  Height  178 cm   Weight 69.4 kg 69.945 kg 69.264 kg   Weight (lbs) 153 lbs 154 lbs 3 oz 152 lbs 11 oz  BMI 21.95 kg/m2 22.13 kg/m2 21.91 kg/m2  Temp 98.3 F (36.8 C) 97.9 F (36.6 C) 98.1 F (36.7 C)  Pulse Rate 112 118 113  BP 126/78 122/74 128/78  Resp 16 18 17   SpO2 100 % 100 % 100 %  BSA (m2) 1.85 m2 1.86 m2 1.85 m2   Laboratory Data    Latest Ref Rng & Units 08/11/2023    2:17 PM 07/27/2023    2:15 PM 07/06/2023    2:20 PM  CBC EXTENDED  WBC 4.0 - 10.5 K/uL 1.8  2.7  3.1   RBC 3.87 - 5.11 MIL/uL 3.23  3.53  3.64   Hemoglobin 12.0 - 15.0 g/dL 53.6  64.4  03.4   HCT 36.0 - 46.0 % 32.8  35.6  36.3   Platelets 150 - 400 K/uL 129  187  192   NEUT# 1.7 - 7.7 K/uL 1.2  2.1  2.0   Lymph# 0.7 - 4.0 K/uL 0.3  0.4  0.4        Latest Ref Rng & Units 08/11/2023    1:21 PM 07/27/2023    2:15 PM 07/06/2023    2:20 PM  CMP  Glucose 70 - 99 mg/dL 742  595  638   BUN 8 - 23 mg/dL 11  13  13    Creatinine 0.44 - 1.00 mg/dL 7.56  4.33  2.95   Sodium 135 - 145 mmol/L 138  138  138   Potassium 3.5 - 5.1 mmol/L 4.0  3.9  3.9   Chloride 98 - 111 mmol/L 107  107  106   CO2 22 - 32 mmol/L 24  26  25    Calcium  8.9 - 10.3 mg/dL 8.8  9.0  8.7   Total Protein 6.5 - 8.1 g/dL 5.7  6.3  6.4   Total Bilirubin 0.0 - 1.2 mg/dL 0.5  0.4  0.5   Alkaline Phos 38 - 126 U/L 106  123  129   AST 15 - 41 U/L 47  44  76   ALT 0 - 44 U/L 11  17  17      Adverse Effects Assessment Loose stool: started yesterday. Took loperamide which was effective. Reviewed how to take loperamide today ANC: monitor. Now 1200 cells/uL  Adherence Assessment DINEEN CONRADT reports missing 0 doses over the past 2 weeks.   Reason for missed dose: N/A Patient was re-educated on importance of adherence.   Access Assessment PEITYN PAYTON is currently receiving her abemaciclib  through [facility] concerns:  None  Medication Reconciliation The patient's medication list was reviewed today with the patient? Yes New medications or herbal supplements  have recently been started? No  Any medications have been discontinued? No  The medication list was updated and reconciled based on the patient's most recent medication list in the electronic medical record (EMR) including herbal products and OTC medications.   Medications Current Outpatient Medications  Medication Sig Dispense Refill   abemaciclib  (VERZENIO ) 100 MG tablet Take 1 tablet (100 mg total) by mouth 2 (two) times daily. (Patient not taking: Reported on 07/27/2023) 60 tablet 3   Calcium  500-100 MG-UNIT CHEW Chew 1 tablet by mouth daily. 60 tablet    cholecalciferol (VITAMIN D3) 25 MCG (1000 UNIT) tablet Take 1 tablet (1,000 Units total) by mouth daily.     furosemide  (LASIX ) 20 MG tablet Take 1 tablet (20 mg total) by mouth daily. If needed may take 1/2 tab q am (Patient not taking: Reported on 07/27/2023) 30 tablet 0   lidocaine -prilocaine  (EMLA ) cream Apply 1 Application topically as needed. Apply one hour prior to port a cath access (Patient not taking: Reported on 07/27/2023) 30 g 1   LORazepam  (ATIVAN ) 0.5 MG tablet Take 1 tablet (0.5 mg total) by mouth at bedtime. (Patient not taking: Reported on 07/27/2023) 30 tablet 0   omeprazole  (PRILOSEC) 20 MG capsule TAKE 1 CAPSULE BY MOUTH ONCE DAILY 30 capsule 3   SYNTHROID  125 MCG tablet TAKE 1 TABLET BY MOUTH ONCE DAILY BEFOREBREAKFAST 90 tablet 0   vitamin C  (ASCORBIC ACID) 250 MG tablet Take 1 tablet (250 mg total) by mouth daily.     Zinc  Acetate, Oral, (ZINC  ACETATE PO) Take by mouth.     No current facility-administered medications for this visit.   Drug-Drug Interactions (DDIs) DDIs were evaluated? Yes Significant DDIs? No  The patient was instructed to speak with their health care provider and/or the oral chemotherapy pharmacist before starting any new drug, including prescription or over the counter, natural / herbal products, or vitamins.  Supportive Care Diarrhea: we reviewed that diarrhea is common with abemaciclib  and  confirmed that she does have loperamide (Imodium) at home.  We reviewed how to take this medication PRN. Neutropenia: we discussed the importance of having a thermometer and what the Centers for Disease Control and Prevention (CDC) considers a fever which is 100.55F (38C) or higher.  Gave patient 24/7 triage  line to call if any fevers or symptoms. ILD/Pneumonitis: we reviewed potential symptoms including cough, shortness, and fatigue.  VTE: reviewed signs of DVT such as leg swelling, redness, pain, or tenderness and signs of PE such as shortness of breath, rapid or irregular heartbeat, cough, chest pain, or lightheadedness. Reviewed to take the medication every 12 hours (with food sometimes can be easier on the stomach) and to take it at the same time every day. Hepatotoxicity: AST slightly elevated from last visit Drug interactions with grapefruit products  Dosing Assessment Hepatic adjustments needed? No  Renal adjustments needed? No  Toxicity adjustments needed? No  The current dosing regimen is appropriate to continue at this time.  Follow-Up Plan Continue abemaciclib  100 mg by mouth every 12 hours Continue fulvestrant  500 mg IM every 28 days after loading doses complete. Third dose due today, then every 28 days thereafter Continue denosumab  120 mg SubQ every 12 weeks. Next due on 09/07/23 Monitor for toxicities (ANC, Hgb, Plts) -- hold for G3 toxicities. Add port flush labs, Dr. Lee Public visit in 2 weeks Add port flush labs, pharmacy clinic visit, fulvestrant , denosumab  120 mg in 4 weeks Lacey Pian can follow up with clinical pharmacy as deemed necessary by Dr. Cameron Cea going forward   Lacey Pian participated in the discussion, expressed understanding, and voiced agreement with the above plan. All questions were answered to her satisfaction. The patient was advised to contact the clinic at [phone] with any questions or concerns prior to her return visit.   I spent 30  minutes assessing and educating the patient.  Natasia Sanko A. Webb Hake, PharmD, BCOP, CPP  Althea Atkinson, RPH-CPP, 08/11/2023  2:51 PM   **Disclaimer: This note was dictated with voice recognition software. Similar sounding words can inadvertently be transcribed and this note may contain transcription errors which may not have been corrected upon publication of note.**

## 2023-08-11 ENCOUNTER — Inpatient Hospital Stay

## 2023-08-11 ENCOUNTER — Inpatient Hospital Stay: Admitting: Pharmacist

## 2023-08-11 ENCOUNTER — Other Ambulatory Visit

## 2023-08-11 ENCOUNTER — Other Ambulatory Visit: Payer: Self-pay | Admitting: *Deleted

## 2023-08-11 VITALS — BP 126/78 | HR 112 | Temp 98.3°F | Resp 16 | Wt 153.0 lb

## 2023-08-11 DIAGNOSIS — Z170 Estrogen receptor positive status [ER+]: Secondary | ICD-10-CM

## 2023-08-11 DIAGNOSIS — C799 Secondary malignant neoplasm of unspecified site: Secondary | ICD-10-CM

## 2023-08-11 DIAGNOSIS — Z5112 Encounter for antineoplastic immunotherapy: Secondary | ICD-10-CM | POA: Diagnosis not present

## 2023-08-11 DIAGNOSIS — E039 Hypothyroidism, unspecified: Secondary | ICD-10-CM

## 2023-08-11 LAB — CBC WITH DIFFERENTIAL (CANCER CENTER ONLY)
Abs Immature Granulocytes: 0.01 10*3/uL (ref 0.00–0.07)
Basophils Absolute: 0 10*3/uL (ref 0.0–0.1)
Basophils Relative: 2 %
Eosinophils Absolute: 0 10*3/uL (ref 0.0–0.5)
Eosinophils Relative: 2 %
HCT: 32.8 % — ABNORMAL LOW (ref 36.0–46.0)
Hemoglobin: 11.1 g/dL — ABNORMAL LOW (ref 12.0–15.0)
Immature Granulocytes: 1 %
Lymphocytes Relative: 19 %
Lymphs Abs: 0.3 10*3/uL — ABNORMAL LOW (ref 0.7–4.0)
MCH: 34.4 pg — ABNORMAL HIGH (ref 26.0–34.0)
MCHC: 33.8 g/dL (ref 30.0–36.0)
MCV: 101.5 fL — ABNORMAL HIGH (ref 80.0–100.0)
Monocytes Absolute: 0.1 10*3/uL (ref 0.1–1.0)
Monocytes Relative: 7 %
Neutro Abs: 1.2 10*3/uL — ABNORMAL LOW (ref 1.7–7.7)
Neutrophils Relative %: 69 %
Platelet Count: 129 10*3/uL — ABNORMAL LOW (ref 150–400)
RBC: 3.23 MIL/uL — ABNORMAL LOW (ref 3.87–5.11)
RDW: 17.1 % — ABNORMAL HIGH (ref 11.5–15.5)
WBC Count: 1.8 10*3/uL — ABNORMAL LOW (ref 4.0–10.5)
nRBC: 0 % (ref 0.0–0.2)

## 2023-08-11 LAB — CMP (CANCER CENTER ONLY)
ALT: 11 U/L (ref 0–44)
AST: 47 U/L — ABNORMAL HIGH (ref 15–41)
Albumin: 3.5 g/dL (ref 3.5–5.0)
Alkaline Phosphatase: 106 U/L (ref 38–126)
Anion gap: 7 (ref 5–15)
BUN: 11 mg/dL (ref 8–23)
CO2: 24 mmol/L (ref 22–32)
Calcium: 8.8 mg/dL — ABNORMAL LOW (ref 8.9–10.3)
Chloride: 107 mmol/L (ref 98–111)
Creatinine: 0.85 mg/dL (ref 0.44–1.00)
GFR, Estimated: >60 mL/min (ref >60)
Glucose, Bld: 101 mg/dL — ABNORMAL HIGH (ref 70–99)
Potassium: 4 mmol/L (ref 3.5–5.1)
Sodium: 138 mmol/L (ref 135–145)
Total Bilirubin: 0.5 mg/dL (ref 0.0–1.2)
Total Protein: 5.7 g/dL — ABNORMAL LOW (ref 6.5–8.1)

## 2023-08-11 LAB — TSH: TSH: 0.775 u[IU]/mL (ref 0.350–4.500)

## 2023-08-11 LAB — T4, FREE: Free T4: 1.22 ng/dL — ABNORMAL HIGH (ref 0.61–1.12)

## 2023-08-11 MED ORDER — HEPARIN SOD (PORK) LOCK FLUSH 100 UNIT/ML IV SOLN
500.0000 [IU] | Freq: Once | INTRAVENOUS | Status: AC
  Administered 2023-08-11: 500 [IU]

## 2023-08-11 MED ORDER — FULVESTRANT 250 MG/5ML IM SOSY
500.0000 mg | PREFILLED_SYRINGE | Freq: Once | INTRAMUSCULAR | Status: AC
  Administered 2023-08-11: 500 mg via INTRAMUSCULAR
  Filled 2023-08-11: qty 10

## 2023-08-11 MED ORDER — SODIUM CHLORIDE 0.9% FLUSH
10.0000 mL | Freq: Once | INTRAVENOUS | Status: AC
  Administered 2023-08-11: 10 mL

## 2023-08-12 ENCOUNTER — Encounter: Payer: Self-pay | Admitting: Pharmacist

## 2023-08-13 ENCOUNTER — Encounter: Payer: Self-pay | Admitting: Hematology and Oncology

## 2023-08-13 ENCOUNTER — Encounter: Payer: Self-pay | Admitting: *Deleted

## 2023-08-17 ENCOUNTER — Ambulatory Visit

## 2023-08-17 ENCOUNTER — Ambulatory Visit: Admitting: Adult Health

## 2023-08-17 ENCOUNTER — Other Ambulatory Visit

## 2023-08-20 ENCOUNTER — Encounter: Payer: Self-pay | Admitting: Hematology and Oncology

## 2023-08-20 ENCOUNTER — Other Ambulatory Visit: Payer: Self-pay

## 2023-08-20 ENCOUNTER — Emergency Department
Admission: EM | Admit: 2023-08-20 | Discharge: 2023-08-20 | Disposition: A | Discharge status: Home / Self Care | Attending: Emergency Medicine | Admitting: Emergency Medicine

## 2023-08-20 ENCOUNTER — Emergency Department

## 2023-08-20 ENCOUNTER — Telehealth: Payer: Self-pay | Admitting: Emergency Medicine

## 2023-08-20 DIAGNOSIS — R569 Unspecified convulsions: Secondary | ICD-10-CM | POA: Insufficient documentation

## 2023-08-20 DIAGNOSIS — G4089 Other seizures: Secondary | ICD-10-CM

## 2023-08-20 DIAGNOSIS — C50919 Malignant neoplasm of unspecified site of unspecified female breast: Secondary | ICD-10-CM

## 2023-08-20 DIAGNOSIS — Z853 Personal history of malignant neoplasm of breast: Secondary | ICD-10-CM | POA: Insufficient documentation

## 2023-08-20 DIAGNOSIS — D72819 Decreased white blood cell count, unspecified: Secondary | ICD-10-CM | POA: Insufficient documentation

## 2023-08-20 LAB — CBC
HCT: 28.9 % — ABNORMAL LOW (ref 36.0–46.0)
Hemoglobin: 9.6 g/dL — ABNORMAL LOW (ref 12.0–15.0)
MCH: 34.4 pg — ABNORMAL HIGH (ref 26.0–34.0)
MCHC: 33.2 g/dL (ref 30.0–36.0)
MCV: 103.6 fL — ABNORMAL HIGH (ref 80.0–100.0)
Platelets: 125 10*3/uL — ABNORMAL LOW (ref 150–400)
RBC: 2.79 MIL/uL — ABNORMAL LOW (ref 3.87–5.11)
RDW: 16.7 % — ABNORMAL HIGH (ref 11.5–15.5)
WBC: 1.1 10*3/uL — CL (ref 4.0–10.5)
nRBC: 0 % (ref 0.0–0.2)

## 2023-08-20 LAB — PROTIME-INR
INR: 1 (ref 0.8–1.2)
Prothrombin Time: 13.2 s (ref 11.4–15.2)

## 2023-08-20 LAB — COMPREHENSIVE METABOLIC PANEL WITH GFR
ALT: 12 U/L (ref 0–44)
AST: 47 U/L — ABNORMAL HIGH (ref 15–41)
Albumin: 3.1 g/dL — ABNORMAL LOW (ref 3.5–5.0)
Alkaline Phosphatase: 82 U/L (ref 38–126)
Anion gap: 11 (ref 5–15)
BUN: 11 mg/dL (ref 8–23)
CO2: 21 mmol/L — ABNORMAL LOW (ref 22–32)
Calcium: 8.7 mg/dL — ABNORMAL LOW (ref 8.9–10.3)
Chloride: 105 mmol/L (ref 98–111)
Creatinine, Ser: 0.78 mg/dL (ref 0.44–1.00)
GFR, Estimated: >60 mL/min (ref >60)
Glucose, Bld: 90 mg/dL (ref 70–99)
Potassium: 3.8 mmol/L (ref 3.5–5.1)
Sodium: 137 mmol/L (ref 135–145)
Total Bilirubin: 0.8 mg/dL (ref 0.0–1.2)
Total Protein: 5.9 g/dL — ABNORMAL LOW (ref 6.5–8.1)

## 2023-08-20 LAB — DIFFERENTIAL
Abs Immature Granulocytes: 0 10*3/uL (ref 0.00–0.07)
Basophils Absolute: 0 10*3/uL (ref 0.0–0.1)
Basophils Relative: 3 %
Eosinophils Absolute: 0 10*3/uL (ref 0.0–0.5)
Eosinophils Relative: 2 %
Immature Granulocytes: 0 %
Lymphocytes Relative: 28 %
Lymphs Abs: 0.3 10*3/uL — ABNORMAL LOW (ref 0.7–4.0)
Monocytes Absolute: 0.1 10*3/uL (ref 0.1–1.0)
Monocytes Relative: 10 %
Neutro Abs: 0.6 10*3/uL — ABNORMAL LOW (ref 1.7–7.7)
Neutrophils Relative %: 57 %
Smear Review: NORMAL

## 2023-08-20 LAB — APTT: aPTT: 29 s (ref 24–36)

## 2023-08-20 LAB — ETHANOL: Alcohol, Ethyl (B): <15 mg/dL (ref <15)

## 2023-08-20 LAB — CBG MONITORING, ED: Glucose-Capillary: 77 mg/dL (ref 70–99)

## 2023-08-20 MED ORDER — LEVETIRACETAM 500 MG PO TABS: 500.0000 mg | ORAL_TABLET | Freq: Two times a day (BID) | ORAL | 0 refills | Status: DC

## 2023-08-20 MED ORDER — LEVETIRACETAM 500 MG PO TABS
500.0000 mg | ORAL_TABLET | Freq: Two times a day (BID) | ORAL | Status: DC
  Administered 2023-08-20: 500 mg via ORAL
  Filled 2023-08-20: qty 1

## 2023-08-20 MED ORDER — HEPARIN SOD (PORK) LOCK FLUSH 100 UNIT/ML IV SOLN
500.0000 [IU] | Freq: Once | INTRAVENOUS | Status: AC
  Administered 2023-08-20: 500 [IU] via INTRAVENOUS
  Filled 2023-08-20: qty 5

## 2023-08-20 MED ORDER — IOHEXOL 350 MG/ML SOLN
75.0000 mL | Freq: Once | INTRAVENOUS | Status: AC | PRN
Start: 2023-08-20 — End: 2023-08-20
  Administered 2023-08-20: 75 mL via INTRAVENOUS

## 2023-08-20 NOTE — Telephone Encounter (Signed)
 Prescription for keppra was not sent to pharmacy- I confirmed with dr Nat Badger that Keppra 500 BID was suppose to be prescribed. I have sent new prescription.

## 2023-08-20 NOTE — ED Notes (Signed)
 Pt given a cup of water and two packs of graham crackers

## 2023-08-20 NOTE — ED Provider Notes (Signed)
 [facility] Provider Note    Event Date/Time   First MD Initiated Contact with Patient 08/20/23 1000     (approximate)   History   Aphasia   HPI  Kathryn Lucas is a 63 y.o. female with history of metastatic breast cancer who presents with complaints of aphasia.  Patient reports she woke up this morning feeling well overall, was getting ready to go to exercise class and felt that she could not get her words out.  This resolved within 30 minutes.  No other symptoms.  Currently she feels well has no complaints.  No headache.     Physical Exam   Triage Vital Signs: ED Triage Vitals [08/20/23 0932]  Encounter Vitals Group     BP (!) 130/96     Systolic BP Percentile      Diastolic BP Percentile      Pulse Rate (!) 115     Resp 17     Temp 97.9 F (36.6 C)     Temp src      SpO2 100 %     Weight      Height      Head Circumference      Peak Flow      Pain Score 0     Pain Loc      Pain Education      Exclude from Growth Chart     Most recent vital signs: Vitals:   08/20/23 1508 08/20/23 1512  BP:  97/72  Pulse:  (!) 103  Resp:  18  Temp: 98.1 F (36.7 C)   SpO2:  100%     General: Awake, no distress.  CV:  Good peripheral perfusion.  Resp:  Normal effort.  Abd:  No distention.  Other:  Cranial nerves II through XII are normal, reassuring neurologic exam   ED Results / Procedures / Treatments   Labs (all labs ordered are listed, but only abnormal results are displayed) Labs Reviewed  CBC - Abnormal; Notable for the following components:      Result Value   WBC 1.1 (*)    RBC 2.79 (*)    Hemoglobin 9.6 (*)    HCT 28.9 (*)    MCV 103.6 (*)    MCH 34.4 (*)    RDW 16.7 (*)    Platelets 125 (*)    All other components within normal limits  DIFFERENTIAL - Abnormal; Notable for the following components:   Neutro Abs 0.6 (*)    Lymphs Abs 0.3 (*)    All other components within normal limits  COMPREHENSIVE METABOLIC PANEL  WITH GFR - Abnormal; Notable for the following components:   CO2 21 (*)    Calcium  8.7 (*)    Total Protein 5.9 (*)    Albumin 3.1 (*)    AST 47 (*)    All other components within normal limits  PROTIME-INR  APTT  ETHANOL  CBG MONITORING, ED  I-STAT CREATININE, ED     EKG  ED ECG REPORT I, Bryson Carbine, the attending physician, personally viewed and interpreted this ECG.   Rhythm: normal sinus rhythm QRS Axis: normal Intervals: normal ST/T Wave abnormalities: normal Narrative Interpretation: no evidence of acute ischemia    RADIOLOGY CT head without evidence of CVA    PROCEDURES:  Critical Care performed:   Procedures   MEDICATIONS ORDERED IN ED: Medications  levETIRAcetam (KEPPRA) tablet 500 mg (500 mg Oral Given 08/20/23 1504)  iohexol  (OMNIPAQUE ) 350 MG/ML  injection 75 mL (75 mLs Intravenous Contrast Given 08/20/23 1215)  heparin  lock flush 100 unit/mL (500 Units Intravenous Given 08/20/23 1519)     IMPRESSION / MDM / ASSESSMENT AND PLAN / ED COURSE  I reviewed the triage vital signs and the nursing notes. Patient's presentation is most consistent with acute presentation with potential threat to life or bodily function.  Patient presents with symptoms concerning for TIA versus CVA.  However all symptoms have resolved.  CT scan is generally reassuring.  Area of attenuation in the right globe however she denies any eye complaints at this time.  Discussed with Dr. Cleone Dad of neurology who recommends CTA head and neck and she will see the patient in the emergency department  Appreciate Dr. Nicholes Barks consultation, she feels this is likely focal seizure, CTA head and neck obtained which is reassuring, she has started the patient on Keppra appropriate for discharge at this time      FINAL CLINICAL IMPRESSION(S) / ED DIAGNOSES   Final diagnoses:  Focal seizure (HCC)     Rx / DC Orders   ED Discharge Orders     None        Note:  This document was  prepared using Dragon voice recognition software and may include unintentional dictation errors.   Bryson Carbine, MD 08/20/23 [phone]

## 2023-08-20 NOTE — Consult Note (Signed)
 NEUROLOGY CONSULT NOTE   Date of service: August 20, 2023 Patient Name: Kathryn Lucas MRN:  [id number] DOB:  18-Aug-1960 Chief Complaint: "Slurred speech, hand stiffening" Requesting Provider: Bryson Carbine, MD  History of Present Illness  Kathryn Lucas is a 63 y.o. female with hx of metastatic breast cancer (on Verzenio  with known mets to the skull, other parts of the skeleton, eye), hypothyroidism  She presents for episodes of right hand stiffness and speech difficulty. The hand stiffness, described as 'tight' and 'like it was having spasms,' has occurred twice recently, once on a Tuesday evening and again today. During the most recent episode, she was unable to feed her dog due to the stiffness. The episodes were brief, and the stiffness resolved after moving the hand. She also experienced slurred speech during these episodes. No drooping of the mouth or weakness in the hand, just stiffening. No headaches or changes in vision during the episodes. She has a breast expander in place and is unsure about plans for its removal.  She had a similar episode last summer as well and followed up with Dr. Mark Sil, neurooncology for this, plan was to defer antiseizure medications unless she had recurrent episodes  ROS  Comprehensive ROS performed and pertinent positives documented in HPI    Past History   Past Medical History:  Diagnosis Date   Breast cancer (HCC)    left breast cancer   Cancer (HCC) 09/2017   left breast cancer   Family history of breast cancer    Hypothyroidism    Personal history of radiation therapy 2019   Thyroid  disease     Past Surgical History:  Procedure Laterality Date   BREAST BIOPSY Left 2018   BREAST BIOPSY Left 2019   BREAST RECONSTRUCTION WITH PLACEMENT OF TISSUE EXPANDER AND ALLODERM Left 10/26/2017   Procedure: LEFT BREAST RECONSTRUCTION WITH PLACEMENT OF TISSUE EXPANDER AND ALLODERM;  Surgeon: Alger Infield, MD;  Location: [facility];   Service: Plastics;  Laterality: Left;   IR IMAGING GUIDED PORT INSERTION  04/27/2023   MASTECTOMY Left 2019   MASTECTOMY WITH RADIOACTIVE SEED GUIDED EXCISION AND AXILLARY SENTINEL LYMPH NODE BIOPSY Left 10/26/2017   Procedure: LEFT MASTECTOMY WITH SEED TARGETED  LEFT AXILLARY LYMPH NODE EXCISION AND LEFT SENTINEL LYMPH NODE BIOPSY;  Surgeon: Lockie Rima, MD;  Location: [facility];  Service: General;  Laterality: Left;   TONSILLECTOMY     WISDOM TOOTH EXTRACTION      Family History: Family History  Problem Relation Age of Onset   Breast cancer Mother 23   Stroke Sister        thought to be due to tamoxifen use   Dementia Maternal Grandmother    COPD Paternal Grandfather     Social History  reports that she has never smoked. She has never used smokeless tobacco. She reports current alcohol use. She reports that she does not use drugs.  Allergies  Allergen Reactions   Percocet [Oxycodone -Acetaminophen ] Nausea And Vomiting    Severe GI upset and vomiting    Medications  No current facility-administered medications for this encounter.  Current Outpatient Medications:    abemaciclib  (VERZENIO ) 100 MG tablet, Take 1 tablet (100 mg total) by mouth 2 (two) times daily. (Patient not taking: Reported on 07/27/2023), Disp: 60 tablet, Rfl: 3   Calcium  500-100 MG-UNIT CHEW, Chew 1 tablet by mouth daily., Disp: 60 tablet, Rfl:    cholecalciferol (VITAMIN D3) 25 MCG (1000 UNIT) tablet, Take 1 tablet (1,000  Units total) by mouth daily., Disp: , Rfl:    furosemide  (LASIX ) 20 MG tablet, Take 1 tablet (20 mg total) by mouth daily. If needed may take 1/2 tab q am (Patient not taking: Reported on 07/27/2023), Disp: 30 tablet, Rfl: 0   lidocaine -prilocaine  (EMLA ) cream, Apply 1 Application topically as needed. Apply one hour prior to port a cath access (Patient not taking: Reported on 07/27/2023), Disp: 30 g, Rfl: 1   LORazepam  (ATIVAN ) 0.5 MG tablet, Take 1 tablet (0.5 mg total) by mouth  at bedtime. (Patient not taking: Reported on 07/27/2023), Disp: 30 tablet, Rfl: 0   omeprazole  (PRILOSEC) 20 MG capsule, TAKE 1 CAPSULE BY MOUTH ONCE DAILY, Disp: 30 capsule, Rfl: 3   SYNTHROID  125 MCG tablet, TAKE 1 TABLET BY MOUTH ONCE DAILY BEFOREBREAKFAST, Disp: 90 tablet, Rfl: 0   vitamin C  (ASCORBIC ACID) 250 MG tablet, Take 1 tablet (250 mg total) by mouth daily., Disp: , Rfl:    Zinc  Acetate, Oral, (ZINC  ACETATE PO), Take by mouth., Disp: , Rfl:   Vitals   Vitals:   09/17/2023 0932 Sep 17, 2023 1030  BP: (!) 130/96 104/77  Pulse: (!) 115 97  Resp: 17 17  Temp: 97.9 F (36.6 C)   SpO2: 100% 100%    There is no height or weight on file to calculate BMI.   Physical Exam   Constitutional: Appears well-developed and well-nourished.  Psych: Affect appropriate to situation, pleasant and cooperative, mildly anxious Eyes: No scleral injection HENT: No oropharyngeal obstruction.  MSK: no joint deformities.  Cardiovascular: Normal rate and regular rhythm.  Respiratory: Effort normal, non-labored breathing GI: Soft.  No distension. There is no tenderness.  Skin: Port in place on right chest  Neurologic Examination   Mental Status: Patient is awake, alert, oriented to person, place, month, year, and situation. Patient is able to give a clear and coherent history. No signs of aphasia or neglect Cranial Nerves: II: Visual Fields are full. Pupils are equal, round, and reactive to light.   III,IV, VI: EOMI without ptosis or diploplia.  Smooth pursuits V: Facial sensation is symmetric to temperature and light touch VII: Facial movement is symmetric.  VIII: hearing is intact to voice X: Uvula elevates symmetrically XI: Shoulder shrug is symmetric. XII: tongue is midline without atrophy or fasciculations.  Motor: Tone is normal. Bulk is normal. 5/5 strength was present in all four extremities, other than mild proximal muscle weakness 4/5 in the bilateral deltoids and hip  flexors Sensory: Sensation is symmetric to light touch and temperature in the arms and legs. Cerebellar: FNF and HKS are intact bilaterally   Labs/Imaging/Neurodiagnostic studies   CBC:  Recent Labs  Lab 09/17/2023 1036  WBC 1.1*  NEUTROABS 0.6*  HGB 9.6*  HCT 28.9*  MCV 103.6*  PLT 125*   Basic Metabolic Panel:  Lab Results  Component Value Date   NA 137 2023-09-17   K 3.8 2023/09/17   CO2 21 (L) September 17, 2023   GLUCOSE 90 2023/09/17   BUN 11 2023-09-17   CREATININE 0.78 09-17-2023   CALCIUM  8.7 (L) 17-Sep-2023   GFRNONAA >60 09/17/23   Alcohol Level     Component Value Date/Time   ETH <15 September 17, 2023 1036   INR  Lab Results  Component Value Date   INR 1.0 17-Sep-2023   APTT  Lab Results  Component Value Date   APTT 29 09-17-2023   AED levels: No results found for: "PHENYTOIN", "ZONISAMIDE", "LAMOTRIGINE", "LEVETIRACETA"  CT Head without contrast(Personally reviewed): 1. No  acute intracranial abnormality. 2. Extensive, predominantly stable areas of osseous metastasis throughout the calvarium. 3. New area of enhancing soft tissue attenuation along the lateral aspect of the right globe, as described above. MRI correlation is recommended. 4. Multiple areas of soft tissue attenuation throughout the scalp, mildly increased in size and number when compared to the prior study. Mild interval progression of metastatic disease cannot be excluded.  CT angio Head and Neck with contrast(Personally reviewed): 1. Negative for large vessel occlusion or significant  atherosclerosis in the head or neck. 2. Positive for abnormal pachymeningeal thickening and enhancement along the left superior frontal convexity, subjacent to severe skull metastases, compatible with Pachymeningeal Carcinomatosis. No intracranial mass effect or cerebral edema by CT. 3. 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 4. 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   ASSESSMENT   SANJA ELIZARDO is a 63 y.o. 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  RECOMMENDATIONS  - CTA head and neck obtained on my initial recommendations, with CT with contrast to better characterize intracranial disease given inability to obtain MRI - Do not feel the need to obtain inpatient EEG as this would not change my management at this time - Lower extremity duplex obtained to assess for any evidence of hypercoagulable state of malignancy based on initial ED provider description concerning for TIA, however given stiffening described by patient to me, very low concern that this represents TIA and favor focal seizure - Keppra 500 mg BID, first dose now, patient to take next dose at bedtime at home and then continue every 12 hours thereafter - Seizure precautions including no driving discussed with patient - Outpatient follow-up with Dr. Mark Sil recommended, have reached out to him to confirm no further inpatient treatment would be recommended from an oncological perspective ______________________________________________________________________  Baldwin Levee MD-PhD [facility] [phone] Available 7 AM to 7 PM, outside these hours please contact Neurologist on call listed on AMION

## 2023-08-20 NOTE — ED Triage Notes (Signed)
 Pt to ED via POV from home. Pt reports woke up this morning at 0640 at baseline. Pt reports at around 0850 started having slurred speech. Pt reports lasted about . NIH 0. Pt at baseline. Pt reports tingling in both hand and feet from chemo. Pt hx of metastatic breast cancer.   Pt wants port accessed for blood.

## 2023-08-25 ENCOUNTER — Inpatient Hospital Stay (HOSPITAL_BASED_OUTPATIENT_CLINIC_OR_DEPARTMENT_OTHER): Attending: Hematology and Oncology | Admitting: Hematology and Oncology

## 2023-08-25 ENCOUNTER — Other Ambulatory Visit (HOSPITAL_COMMUNITY): Payer: Self-pay

## 2023-08-25 ENCOUNTER — Other Ambulatory Visit: Payer: Self-pay | Admitting: *Deleted

## 2023-08-25 ENCOUNTER — Inpatient Hospital Stay: Attending: Hematology and Oncology

## 2023-08-25 ENCOUNTER — Other Ambulatory Visit: Payer: Self-pay

## 2023-08-25 VITALS — BP 117/69 | HR 134 | Temp 98.4°F | Resp 17 | Wt 148.6 lb

## 2023-08-25 DIAGNOSIS — R197 Diarrhea, unspecified: Secondary | ICD-10-CM | POA: Diagnosis not present

## 2023-08-25 DIAGNOSIS — Z9221 Personal history of antineoplastic chemotherapy: Secondary | ICD-10-CM | POA: Diagnosis not present

## 2023-08-25 DIAGNOSIS — Z170 Estrogen receptor positive status [ER+]: Secondary | ICD-10-CM | POA: Insufficient documentation

## 2023-08-25 DIAGNOSIS — C50512 Malignant neoplasm of lower-outer quadrant of left female breast: Secondary | ICD-10-CM | POA: Diagnosis not present

## 2023-08-25 DIAGNOSIS — Z9012 Acquired absence of left breast and nipple: Secondary | ICD-10-CM | POA: Diagnosis not present

## 2023-08-25 DIAGNOSIS — Z923 Personal history of irradiation: Secondary | ICD-10-CM | POA: Insufficient documentation

## 2023-08-25 DIAGNOSIS — Z79811 Long term (current) use of aromatase inhibitors: Secondary | ICD-10-CM | POA: Diagnosis not present

## 2023-08-25 DIAGNOSIS — R5383 Other fatigue: Secondary | ICD-10-CM | POA: Insufficient documentation

## 2023-08-25 DIAGNOSIS — R569 Unspecified convulsions: Secondary | ICD-10-CM | POA: Insufficient documentation

## 2023-08-25 DIAGNOSIS — Z79899 Other long term (current) drug therapy: Secondary | ICD-10-CM | POA: Insufficient documentation

## 2023-08-25 DIAGNOSIS — C7931 Secondary malignant neoplasm of brain: Secondary | ICD-10-CM | POA: Insufficient documentation

## 2023-08-25 DIAGNOSIS — Z5111 Encounter for antineoplastic chemotherapy: Secondary | ICD-10-CM | POA: Insufficient documentation

## 2023-08-25 DIAGNOSIS — C799 Secondary malignant neoplasm of unspecified site: Secondary | ICD-10-CM

## 2023-08-25 DIAGNOSIS — C7951 Secondary malignant neoplasm of bone: Secondary | ICD-10-CM | POA: Insufficient documentation

## 2023-08-25 DIAGNOSIS — D72819 Decreased white blood cell count, unspecified: Secondary | ICD-10-CM | POA: Insufficient documentation

## 2023-08-25 DIAGNOSIS — R634 Abnormal weight loss: Secondary | ICD-10-CM | POA: Insufficient documentation

## 2023-08-25 LAB — CBC WITH DIFFERENTIAL (CANCER CENTER ONLY)
Abs Immature Granulocytes: 0 10*3/uL (ref 0.00–0.07)
Basophils Absolute: 0 10*3/uL (ref 0.0–0.1)
Basophils Relative: 2 %
Eosinophils Absolute: 0 10*3/uL (ref 0.0–0.5)
Eosinophils Relative: 1 %
HCT: 32 % — ABNORMAL LOW (ref 36.0–46.0)
Hemoglobin: 11.1 g/dL — ABNORMAL LOW (ref 12.0–15.0)
Immature Granulocytes: 0 %
Lymphocytes Relative: 22 %
Lymphs Abs: 0.4 10*3/uL — ABNORMAL LOW (ref 0.7–4.0)
MCH: 35.2 pg — ABNORMAL HIGH (ref 26.0–34.0)
MCHC: 34.7 g/dL (ref 30.0–36.0)
MCV: 101.6 fL — ABNORMAL HIGH (ref 80.0–100.0)
Monocytes Absolute: 0.2 10*3/uL (ref 0.1–1.0)
Monocytes Relative: 9 %
Neutro Abs: 1.3 10*3/uL — ABNORMAL LOW (ref 1.7–7.7)
Neutrophils Relative %: 66 %
Platelet Count: 160 10*3/uL (ref 150–400)
RBC: 3.15 MIL/uL — ABNORMAL LOW (ref 3.87–5.11)
RDW: 16.9 % — ABNORMAL HIGH (ref 11.5–15.5)
WBC Count: 2 10*3/uL — ABNORMAL LOW (ref 4.0–10.5)
nRBC: 0 % (ref 0.0–0.2)

## 2023-08-25 LAB — CMP (CANCER CENTER ONLY)
ALT: 10 U/L (ref 0–44)
AST: 47 U/L — ABNORMAL HIGH (ref 15–41)
Albumin: 3.8 g/dL (ref 3.5–5.0)
Alkaline Phosphatase: 93 U/L (ref 38–126)
Anion gap: 8 (ref 5–15)
BUN: 12 mg/dL (ref 8–23)
CO2: 24 mmol/L (ref 22–32)
Calcium: 9.2 mg/dL (ref 8.9–10.3)
Chloride: 104 mmol/L (ref 98–111)
Creatinine: 0.9 mg/dL (ref 0.44–1.00)
GFR, Estimated: >60 mL/min (ref >60)
Glucose, Bld: 131 mg/dL — ABNORMAL HIGH (ref 70–99)
Potassium: 3.7 mmol/L (ref 3.5–5.1)
Sodium: 136 mmol/L (ref 135–145)
Total Bilirubin: 0.6 mg/dL (ref 0.0–1.2)
Total Protein: 6.6 g/dL (ref 6.5–8.1)

## 2023-08-25 MED ORDER — SODIUM CHLORIDE 0.9% FLUSH
10.0000 mL | Freq: Once | INTRAVENOUS | Status: AC
  Administered 2023-08-25: 10 mL

## 2023-08-25 MED ORDER — ABEMACICLIB 50 MG PO TABS
50.0000 mg | ORAL_TABLET | Freq: Two times a day (BID) | ORAL | 3 refills | Status: DC
  Filled 2023-08-25: qty 56, 28d supply, fill #0
  Filled 2023-09-15: qty 56, 28d supply, fill #1

## 2023-08-25 MED ORDER — HEPARIN SOD (PORK) LOCK FLUSH 100 UNIT/ML IV SOLN
500.0000 [IU] | Freq: Once | INTRAVENOUS | Status: AC
  Administered 2023-08-25: 500 [IU]

## 2023-08-25 NOTE — Progress Notes (Signed)
 Patient Care Team: Elester Grim, MD as PCP - General (Internal Medicine) Cameron Cea, MD as Consulting Physician (Hematology and Oncology) Lockie Rima, MD as Consulting Physician (General Surgery) Johna Myers, MD as Consulting Physician (Radiation Oncology) Glenis Langdon, MD as Consulting Physician (Radiation Oncology)  DIAGNOSIS:  Encounter Diagnosis  Name Primary?   Malignant neoplasm of lower-outer quadrant of left breast of female, estrogen receptor positive (HCC) Yes    SUMMARY OF ONCOLOGIC HISTORY: Oncology History  Malignant neoplasm of lower-outer quadrant of left breast of female, estrogen receptor positive (HCC)  06/11/2016 Mammogram   Palpable left breast masses 3:00 position: 2.2 cm; 5:30 position: 2.5 cm; 6:30 position: 0.7 cm   06/19/2016 Initial Diagnosis   Left breast biopsy 3:30: IDC with DCIS grade 1, ER 90%, PR 50%, Ki-67 15%, HER-2 negative ratio 1.13; biopsy 5:30 position: IDC grade 1   07/13/2016 Breast MRI   Large area of abnormal enhancement lower inner and lower outer quadrants left breast spanning 9 cm x 6.4 cm x 5.3 cm, no abnormal enlarged lymph nodes; T3 N0 stage II a (New AJCC staging)    07/15/2016 - 12/08/2017 Anti-estrogen oral therapy   Neoadjuvant anastrozole  1 mg daily   07/17/2016 Oncotype testing   Testing done on the biopsy: Oncotype DX score 22, intermediate risk   02/02/2017 Breast MRI   Left breast multicentric disease unchanged measuring 2.7 x 1.6 cm.  Mass in the non-mass enhancement are also not significantly changed measuring 6.2 x 2.4 cm. new enhancing mass within the outer right breast 7 mm which could be fat necrosis or inclusion cyst    02/09/2017 Imaging   Ultrasound of the right breast lesion noted on MRI: No sonographic finding corresponds to the abnormality noted on MRI   07/13/2017 Cancer Staging   Staging form: Breast, AJCC 8th Edition - Clinical stage from 07/13/2017: Stage IIA (cT3, cN0, cM0, G1, ER+, PR+, HER2-) -  Signed by Percival Brace, NP on 05/18/2018   10/26/2017 Surgery   Left mastectomy: IDC grade 1, 2 foci largest spans 8.5 cm, intermediate grade DCIS, lymphovascular invasion identified, perineural invasion identified, 1/2 lymph nodes positive with extracapsular extension, ER 9200%, PR 5 to 50%, HER-2 negative, Ki-67 10 to 15%, T3N1A Mammaprint: low risk   11/02/2017 Cancer Staging   Staging form: Breast, AJCC 8th Edition - Pathologic: No Stage Recommended (ypT3, pN1a, cM0, G1, ER+, PR+, HER2-) - Signed by Cameron Cea, MD on 11/02/2017   12/08/2017 - 01/26/2018 Radiation Therapy   Adjuvant radiation therapy    02/2018 -  Anti-estrogen oral therapy   Anastrozole  1 mg daily adjuvant therapy   10/21/2022 - 07/06/2023 Chemotherapy   Patient is on Treatment Plan : BREAST METASTATIC Fam-Trastuzumab Deruxtecan-nxki  (Enhertu ) (5.4) q21d       CHIEF COMPLIANT:   HISTORY OF PRESENT ILLNESS:   History of Present Illness Kathryn Lucas is a 63 year old female with breast cancer who presents with slurred speech.  She experiences slurred speech and an inability to speak, leading to an emergency room visit. A CT angiogram of the brain shows pachymeningeal carcinomatosis.  She experiences fatigue, particularly after the emergency room visit, and has noticed weight loss, which she attributes to not feeling well. She has been on Verzenio  for two months and had an episode of vomiting after taking a large allergy pill, resulting in the expulsion of water and her medications, including those for seizures and cancer.  She has a history of seizures, with the most  recent one occurring this past summer. She has not seen her neuro-oncologist since September.     ALLERGIES:  is allergic to percocet [oxycodone -acetaminophen ].  MEDICATIONS:  Current Outpatient Medications  Medication Sig Dispense Refill   abemaciclib  (VERZENIO ) 100 MG tablet Take 1 tablet (100 mg total) by mouth 2 (two) times daily.  (Patient not taking: Reported on 07/27/2023) 60 tablet 3   Calcium  500-100 MG-UNIT CHEW Chew 1 tablet by mouth daily. 60 tablet    cholecalciferol (VITAMIN D3) 25 MCG (1000 UNIT) tablet Take 1 tablet (1,000 Units total) by mouth daily.     furosemide  (LASIX ) 20 MG tablet Take 1 tablet (20 mg total) by mouth daily. If needed may take 1/2 tab q am (Patient not taking: Reported on 07/27/2023) 30 tablet 0   levETIRAcetam  (KEPPRA ) 500 MG tablet Take 1 tablet (500 mg total) by mouth 2 (two) times daily. 60 tablet 0   lidocaine -prilocaine  (EMLA ) cream Apply 1 Application topically as needed. Apply one hour prior to port a cath access (Patient not taking: Reported on 07/27/2023) 30 g 1   LORazepam  (ATIVAN ) 0.5 MG tablet Take 1 tablet (0.5 mg total) by mouth at bedtime. (Patient not taking: Reported on 07/27/2023) 30 tablet 0   omeprazole  (PRILOSEC) 20 MG capsule TAKE 1 CAPSULE BY MOUTH ONCE DAILY 30 capsule 3   SYNTHROID  125 MCG tablet TAKE 1 TABLET BY MOUTH ONCE DAILY BEFOREBREAKFAST 90 tablet 0   vitamin C  (ASCORBIC ACID) 250 MG tablet Take 1 tablet (250 mg total) by mouth daily.     Zinc  Acetate, Oral, (ZINC  ACETATE PO) Take by mouth.     No current facility-administered medications for this visit.    PHYSICAL EXAMINATION: ECOG PERFORMANCE STATUS: 1 - Symptomatic but completely ambulatory  There were no vitals filed for this visit. There were no vitals filed for this visit.  Physical Exam   (exam performed in the presence of a chaperone)  LABORATORY DATA:  I have reviewed the data as listed    Latest Ref Rng & Units 08/20/2023   10:36 AM 08/11/2023    1:21 PM 07/27/2023    2:15 PM  CMP  Glucose 70 - 99 mg/dL 90  604  540   BUN 8 - 23 mg/dL 11  11  13    Creatinine 0.44 - 1.00 mg/dL 9.81  1.91  4.78   Sodium 135 - 145 mmol/L 137  138  138   Potassium 3.5 - 5.1 mmol/L 3.8  4.0  3.9   Chloride 98 - 111 mmol/L 105  107  107   CO2 22 - 32 mmol/L 21  24  26    Calcium  8.9 - 10.3 mg/dL 8.7  8.8   9.0   Total Protein 6.5 - 8.1 g/dL 5.9  5.7  6.3   Total Bilirubin 0.0 - 1.2 mg/dL 0.8  0.5  0.4   Alkaline Phos 38 - 126 U/L 82  106  123   AST 15 - 41 U/L 47  47  44   ALT 0 - 44 U/L 12  11  17      Lab Results  Component Value Date   WBC 1.1 (LL) 08/20/2023   HGB 9.6 (L) 08/20/2023   HCT 28.9 (L) 08/20/2023   MCV 103.6 (H) 08/20/2023   PLT 125 (L) 08/20/2023   NEUTROABS 0.6 (L) 08/20/2023    ASSESSMENT & PLAN:  Malignant neoplasm of lower-outer quadrant of left breast of female, estrogen receptor positive (HCC) 10/26/17: Left mastectomy: IDC grade  1, 2 foci largest spans 8.5 cm, intermediate grade DCIS, lymphovascular invasion identified, perineural invasion identified, 1/2 lymph nodes positive with extracapsular extension, ER 9200%, PR 5 to 50%, HER-2 negative, Ki-67 10 to 15%, T3N1A   Oncotype DX score 22, intermediate risk, chemotherapy not felt to have significant benefit.   Treatment Summary: 1. Antiestrogen therapy with anastrozole  1 mg daily started 07/15/2016 2. Mastectomy 10/26/2017, Mammaprint low risk luminal type A 3. Followed by adjuvant radiation 12/08/17- 01/26/18  4. Followed by adjuvant antiestrogen therapy anastrozole  started 01/17/2018 (originally started 07/15/2016) 5.  August 2023: Low back pain: Large lesion in the sacrum biopsy of sacrum: 12/26/2021: Metastatic breast cancer, ER 90%, PR 10%, HER2 negative (0) 6. Ibrance  along with Faslodex  started 12/25/2021-10/14/2022 7. Palliative radiation to the sacrum completed 01/19/2022 8.  Enhertu  started 10/21/2022-07/06/2023 9.  Skin biopsy: 06/14/2023: Poorly differentiated adenocarcinoma x 2 biopsies: ER 99%, PR 2%, HER2 1+, Ki-67 25%, [facility] molecular testing: HER2 low, hormone receptor positive, ESR 1 mutation (no PIK3CA, no PALB2, no PD-L1, TMB 4) --------------------------------------------------------------------   Current treatment: Verzinio with Faslodex  started 07/16/2023 CT CAP 05/21/2023: Extensive bone metastases  similar, low-attenuation liver lesion not clearly seen previously (liver MRI suggested)   Verzinio toxicities: Tolerating fairly well.  Intermittent loose stools but not causing concern. Fatigue Blood counts have been reviewed.  Although the WBC count is low but neutrophil count is stable.  CT angiogram 08/20/2023: At the ED for aphasia: Positive for abnormal pachymeningeal thickening and enhancement along the left superior frontal convexity subjacent to severe skull metastasis compatible with pachymeningeal carcinomatosis, other skeletal metastases including pathologic fracture of C3  Return to clinic in 4 weeks for labs and follow-up  Assessment & Plan Malignant neoplasm of lower-outer quadrant of left breast, estrogen receptor positive Treatment regimen adjusted due to leukopenia. Issues with medication adherence due to vomiting and potential seizure activity. Concerns about treatment side effects. - Adjust treatment based on lab results and tolerance. - Perform a scan after three months to assess response.  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 - Consult with neuro-oncologist Dr. Mark Sil for further recommendations.  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 Decision to adjust dosage to address low counts. - Discontinue current dose of Verzenio . - Initiate Verzenio  at 50 mg twice daily once available. - Monitor blood counts and adjust treatment as necessary.  Seizure disorder Recent episode of slurred speech and inability to speak, possibly related to seizure activity. Not seen by Dr. Birdena Buggy since September. - Send a note to Dr. Mark Sil for further evaluation and management.      No orders of the defined types were placed in this encounter.  The patient has a good understanding of the overall plan. she agrees with it. she will call with any problems that may  develop before the next visit here. Total time spent: 30 mins including face to face time and time spent for planning, charting and co-ordination of care   Viinay K Natally Ribera, MD 08/25/23

## 2023-08-25 NOTE — Assessment & Plan Note (Signed)
 10/26/17: Left mastectomy: IDC grade 1, 2 foci largest spans 8.5 cm, intermediate grade DCIS, lymphovascular invasion identified, perineural invasion identified, 1/2 lymph nodes positive with extracapsular extension, ER 9200%, PR 5 to 50%, HER-2 negative, Ki-67 10 to 15%, T3N1A   Oncotype DX score 22, intermediate risk, chemotherapy not felt to have significant benefit.   Treatment Summary: 1. Antiestrogen therapy with anastrozole  1 mg daily started 07/15/2016 2. Mastectomy 10/26/2017, Mammaprint low risk luminal type A 3. Followed by adjuvant radiation 12/08/17- 01/26/18  4. Followed by adjuvant antiestrogen therapy anastrozole  started 01/17/2018 (originally started 07/15/2016) 5.  August 2023: Low back pain: Large lesion in the sacrum biopsy of sacrum: 12/26/2021: Metastatic breast cancer, ER 90%, PR 10%, HER2 negative (0) 6. Ibrance  along with Faslodex  started 12/25/2021-10/14/2022 7. Palliative radiation to the sacrum completed 01/19/2022 8.  Enhertu  started 10/21/2022-07/06/2023 9.  Skin biopsy: 06/14/2023: Poorly differentiated adenocarcinoma x 2 biopsies: ER 99%, PR 2%, HER2 1+, Ki-67 25%, Caris molecular testing: HER2 low, hormone receptor positive, ESR 1 mutation (no PIK3CA, no PALB2, no PD-L1, TMB 4) --------------------------------------------------------------------   Current treatment: Verzinio with Faslodex  started 07/16/2023 CT CAP 05/21/2023: Extensive bone metastases similar, low-attenuation liver lesion not clearly seen previously (liver MRI suggested)   Verzinio toxicities: Tolerating fairly well.  Intermittent loose stools but not causing concern. Fatigue Blood counts have been reviewed.  Although the WBC count is low but neutrophil count is stable.  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

## 2023-08-26 ENCOUNTER — Other Ambulatory Visit: Payer: Self-pay | Admitting: *Deleted

## 2023-08-26 ENCOUNTER — Encounter: Payer: Self-pay | Admitting: Hematology and Oncology

## 2023-08-26 ENCOUNTER — Telehealth: Payer: Self-pay | Admitting: Internal Medicine

## 2023-08-26 ENCOUNTER — Other Ambulatory Visit: Payer: Self-pay

## 2023-08-26 ENCOUNTER — Other Ambulatory Visit (HOSPITAL_COMMUNITY): Payer: Self-pay

## 2023-08-26 MED ORDER — CEPHALEXIN 500 MG PO CAPS: 500.0000 mg | ORAL_CAPSULE | Freq: Two times a day (BID) | ORAL | 0 refills | Status: DC

## 2023-08-26 NOTE — Progress Notes (Signed)
 Specialty Pharmacy Refill Coordination Note  Kathryn Lucas is a 63 y.o. female contacted today regarding refills of specialty medication(s) Abemaciclib  (VERZENIO )   Patient requested [name] at [facility] at [place] date: 08/26/23   Medication will be filled on 06.12.25.

## 2023-08-26 NOTE — Progress Notes (Signed)
 Received mychart message from pt with complaint of opening sores on her shoulders and send a picture.  Per MD pt needing Keflex 500 mg p.o BID x7 days and apply OTC triple antibiotic ointment.  Prescription sen to pharmacy on file, pt educated and verbalized understanding.

## 2023-08-27 ENCOUNTER — Other Ambulatory Visit: Payer: Self-pay

## 2023-08-27 ENCOUNTER — Other Ambulatory Visit (HOSPITAL_COMMUNITY): Payer: Self-pay

## 2023-08-30 ENCOUNTER — Encounter: Payer: Self-pay | Admitting: Hematology and Oncology

## 2023-09-02 ENCOUNTER — Inpatient Hospital Stay (HOSPITAL_BASED_OUTPATIENT_CLINIC_OR_DEPARTMENT_OTHER): Admitting: Physician Assistant

## 2023-09-02 ENCOUNTER — Encounter: Payer: Self-pay | Admitting: Hematology and Oncology

## 2023-09-02 ENCOUNTER — Other Ambulatory Visit: Payer: Self-pay | Admitting: *Deleted

## 2023-09-02 ENCOUNTER — Inpatient Hospital Stay: Admitting: Internal Medicine

## 2023-09-02 VITALS — BP 129/59 | HR 120 | Temp 97.9°F | Resp 20 | Wt 151.8 lb

## 2023-09-02 VITALS — BP 129/59 | HR 112 | Temp 97.9°F | Resp 20

## 2023-09-02 DIAGNOSIS — Z5111 Encounter for antineoplastic chemotherapy: Secondary | ICD-10-CM | POA: Diagnosis not present

## 2023-09-02 DIAGNOSIS — R569 Unspecified convulsions: Secondary | ICD-10-CM | POA: Insufficient documentation

## 2023-09-02 DIAGNOSIS — C50512 Malignant neoplasm of lower-outer quadrant of left female breast: Secondary | ICD-10-CM | POA: Diagnosis not present

## 2023-09-02 DIAGNOSIS — C799 Secondary malignant neoplasm of unspecified site: Secondary | ICD-10-CM

## 2023-09-02 DIAGNOSIS — C7931 Secondary malignant neoplasm of brain: Secondary | ICD-10-CM

## 2023-09-02 DIAGNOSIS — C7951 Secondary malignant neoplasm of bone: Secondary | ICD-10-CM

## 2023-09-02 DIAGNOSIS — Z170 Estrogen receptor positive status [ER+]: Secondary | ICD-10-CM

## 2023-09-02 MED ORDER — LEVETIRACETAM 750 MG PO TABS: 750.0000 mg | ORAL_TABLET | Freq: Two times a day (BID) | ORAL | 2 refills | Status: DC

## 2023-09-02 NOTE — Progress Notes (Signed)
 [facility] at [facility] [address]  [place], [place] [address] [phone]   Interval Evaluation  Date of Service: 09/02/23 Patient Name: Kathryn Lucas Patient MRN: [id number] Patient DOB: 04-21-1960 Provider: Mamie Searles, MD  Identifying Statement:  Kathryn Lucas is a 63 y.o. female with Focal seizure (HCC)    Primary Cancer:  Oncologic History: Oncology History  Malignant neoplasm of lower-outer quadrant of left breast of female, estrogen receptor positive (HCC)  06/11/2016 Mammogram   Palpable left breast masses 3:00 position: 2.2 cm; 5:30 position: 2.5 cm; 6:30 position: 0.7 cm   06/19/2016 Initial Diagnosis   Left breast biopsy 3:30: IDC with DCIS grade 1, ER 90%, PR 50%, Ki-67 15%, HER-2 negative ratio 1.13; biopsy 5:30 position: IDC grade 1   07/13/2016 Breast MRI   Large area of abnormal enhancement lower inner and lower outer quadrants left breast spanning 9 cm x 6.4 cm x 5.3 cm, no abnormal enlarged lymph nodes; T3 N0 stage II a (New AJCC staging)    07/15/2016 - 12/08/2017 Anti-estrogen oral therapy   Neoadjuvant anastrozole  1 mg daily   07/17/2016 Oncotype testing   Testing done on the biopsy: Oncotype DX score 22, intermediate risk   02/02/2017 Breast MRI   Left breast multicentric disease unchanged measuring 2.7 x 1.6 cm.  Mass in the non-mass enhancement are also not significantly changed measuring 6.2 x 2.4 cm. new enhancing mass within the outer right breast 7 mm which could be fat necrosis or inclusion cyst    02/09/2017 Imaging   Ultrasound of the right breast lesion noted on MRI: No sonographic finding corresponds to the abnormality noted on MRI   07/13/2017 Cancer Staging   Staging form: Breast, AJCC 8th Edition - Clinical stage from 07/13/2017: Stage IIA (cT3, cN0, cM0, G1, ER+, PR+, HER2-) - Signed by Percival Brace, NP on 05/18/2018   10/26/2017 Surgery   Left mastectomy: IDC grade 1, 2 foci largest spans 8.5  cm, intermediate grade DCIS, lymphovascular invasion identified, perineural invasion identified, 1/2 lymph nodes positive with extracapsular extension, ER 9200%, PR 5 to 50%, HER-2 negative, Ki-67 10 to 15%, T3N1A Mammaprint: low risk   11/02/2017 Cancer Staging   Staging form: Breast, AJCC 8th Edition - Pathologic: No Stage Recommended (ypT3, pN1a, cM0, G1, ER+, PR+, HER2-) - Signed by Cameron Cea, MD on 11/02/2017   12/08/2017 - 01/26/2018 Radiation Therapy   Adjuvant radiation therapy    02/2018 -  Anti-estrogen oral therapy   Anastrozole  1 mg daily adjuvant therapy   10/21/2022 - 07/06/2023 Chemotherapy   Patient is on Treatment Plan : BREAST METASTATIC Fam-Trastuzumab Deruxtecan-nxki  (Enhertu ) (5.4) q21d       Interval History: BREEZY HERTENSTEIN presents today for follow up after recent clinical changes.  She describes episode of sudden onset speech arrest on 08/20/23, noticed by sister when she was on the phone with the patient.  Episode lasted several minutes and was followed by period of confusion.  She eventually returned to baseline; hospital/ED team was concerned for seizure and started her on Keppra  500mg  twice per day, which she has been taking.  Does describe a second episode of same semiology, happened earlier this week in the morning.  No other episodes, or new or progerssive neurologic changes otherwise.  Enhertu  has been on hold since April given tolerability issues, continues to follow with Dr. Gudena.  H+P (09/24/22) Patient presents today to review recent neurologic symptoms.  She describes an episode  of sudden inability to speak, not making sense which happened at the end of last month with her sister in [place].  This led to a hospitalization at [facility] for stroke evaluation.  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  She was started on decadron , which she attributes to subsequent  blurry vision and double vision.  This has improved and mostly resolved since the steroids were stopped earlier in the week.  Continues to experience significant stiffness and immobility in her neck.  Medications: Current Outpatient Medications on File Prior to Visit  Medication Sig Dispense Refill   abemaciclib  (VERZENIO ) 50 MG tablet Take 1 tablet (50 mg total) by mouth 2 (two) times daily. 60 tablet 3   Calcium  500-100 MG-UNIT CHEW Chew 1 tablet by mouth daily. 60 tablet    cephALEXin  (KEFLEX ) 500 MG capsule Take 1 capsule (500 mg total) by mouth 2 (two) times daily. 14 capsule 0   cholecalciferol (VITAMIN D3) 25 MCG (1000 UNIT) tablet Take 1 tablet (1,000 Units total) by mouth daily.     furosemide  (LASIX ) 20 MG tablet Take 1 tablet (20 mg total) by mouth daily. If needed may take 1/2 tab q am (Patient taking differently: Take 1 tablet (20 mg total) by mouth daily. If needed may take 1/2 tab q am) 30 tablet 0   levETIRAcetam  (KEPPRA ) 500 MG tablet Take 1 tablet (500 mg total) by mouth 2 (two) times daily. 60 tablet 0   omeprazole  (PRILOSEC) 20 MG capsule TAKE 1 CAPSULE BY MOUTH ONCE DAILY 30 capsule 3   SYNTHROID  125 MCG tablet TAKE 1 TABLET BY MOUTH ONCE DAILY BEFOREBREAKFAST 90 tablet 0   vitamin C  (ASCORBIC ACID) 250 MG tablet Take 1 tablet (250 mg total) by mouth daily.     Zinc  Acetate, Oral, (ZINC  ACETATE PO) Take by mouth.     lidocaine -prilocaine  (EMLA ) cream Apply 1 Application topically as needed. Apply one hour prior to port a cath access (Patient not taking: Reported on 09/02/2023) 30 g 1   LORazepam  (ATIVAN ) 0.5 MG tablet Take 1 tablet (0.5 mg total) by mouth at bedtime. (Patient not taking: Reported on 09/02/2023) 30 tablet 0   No current facility-administered medications on file prior to visit.    Allergies:  Allergies  Allergen Reactions   Percocet [Oxycodone -Acetaminophen ] Nausea And Vomiting    Severe GI upset and vomiting   Past Medical History:  Past Medical History:   Diagnosis Date   Breast cancer (HCC)    left breast cancer   Cancer (HCC) 09/2017   left breast cancer   Family history of breast cancer    Hypothyroidism    Personal history of radiation therapy 2019   Thyroid  disease    Past Surgical History:  Past Surgical History:  Procedure Laterality Date   BREAST BIOPSY Left 2018   BREAST BIOPSY Left 2019   BREAST RECONSTRUCTION WITH PLACEMENT OF TISSUE EXPANDER AND ALLODERM Left 10/26/2017   Procedure: LEFT BREAST RECONSTRUCTION WITH PLACEMENT OF TISSUE EXPANDER AND ALLODERM;  Surgeon: Alger Infield, MD;  Location: [facility];  Service: Plastics;  Laterality: Left;   IR IMAGING GUIDED PORT INSERTION  04/27/2023   MASTECTOMY Left 2019   MASTECTOMY WITH RADIOACTIVE SEED GUIDED EXCISION AND AXILLARY SENTINEL LYMPH NODE BIOPSY Left 10/26/2017   Procedure: LEFT MASTECTOMY WITH SEED TARGETED  LEFT AXILLARY LYMPH NODE EXCISION AND LEFT SENTINEL LYMPH NODE BIOPSY;  Surgeon:  Lockie Rima, MD;  Location: [facility];  Service: General;  Laterality: Left;   TONSILLECTOMY     WISDOM TOOTH EXTRACTION     Social History:  Social History   Socioeconomic History   Marital status: Single    Spouse name: Not on file   Number of children: Not on file   Years of education: Not on file   Highest education level: Not on file  Occupational History   Not on file  Tobacco Use   Smoking status: Never   Smokeless tobacco: Never  Vaping Use   Vaping status: Never Used  Substance and Sexual Activity   Alcohol use: Yes    Alcohol/week: 0.0 standard drinks of alcohol    Comment: social   Drug use: Never   Sexual activity: Not on file  Other Topics Concern   Not on file  Social History Narrative   Lives alone and one dog.   Social Drivers of Corporate investment banker Strain: Not on file  Food Insecurity: No Food Insecurity (09/14/2022)   Hunger Vital Sign    Worried About Running Out of Food in the Last Year: Never  true    Ran Out of Food in the Last Year: Never true  Transportation Needs: No Transportation Needs (09/14/2022)   PRAPARE - Administrator, Civil Service (Medical): No    Lack of Transportation (Non-Medical): No  Physical Activity: Not on file  Stress: Not on file  Social Connections: Not on file  Intimate Partner Violence: Not At Risk (09/14/2022)   Humiliation, Afraid, Rape, and Kick questionnaire    Fear of Current or Ex-Partner: No    Emotionally Abused: No    Physically Abused: No    Sexually Abused: No   Family History:  Family History  Problem Relation Age of Onset   Breast cancer Mother 60   Stroke Sister        thought to be due to tamoxifen use   Dementia Maternal Grandmother    COPD Paternal Grandfather     Review of Systems: Constitutional: Doesn't report fevers, chills or abnormal weight loss Eyes: Doesn't report blurriness of vision Ears, nose, mouth, throat, and face: Doesn't report sore throat Respiratory: Doesn't report cough, dyspnea or wheezes Cardiovascular: Doesn't report palpitation, chest discomfort  Gastrointestinal:  Doesn't report nausea, constipation, diarrhea GU: Doesn't report incontinence Skin: Doesn't report skin rashes Neurological: Per HPI Musculoskeletal: Doesn't report joint pain Behavioral/Psych: Doesn't report anxiety  Physical Exam: Vitals:   09/02/23 1415  BP: (!) 129/59  Pulse: (!) 120  Resp: 20  Temp: 97.9 F (36.6 C)  SpO2: 96%    KPS: 70. General: Alert, cooperative, pleasant, in no acute distress Head: normal ROM in neck, some discomfort EENT: No conjunctival injection or scleral icterus.  Lungs: Resp effort normal Cardiac: Regular rate Abdomen: Non-distended abdomen Skin: No rashes cyanosis or petechiae. Extremities: No clubbing or edema  Neurologic Exam: Mental Status: Awake, alert, attentive to examiner. Oriented to self and environment. Language is fluent with intact comprehension.  Cranial Nerves:  Visual acuity is grossly normal. Visual fields are full. Extra-ocular movements intact. No ptosis. Face is symmetric Motor: Tone and bulk are normal. Power is full in both arms and legs. Reflexes are symmetric, no pathologic reflexes present.  Sensory: Intact to light touch Gait: Deferred   Labs: I have reviewed the data as listed    Component Value Date/Time   NA 136 08/25/2023 1359   K 3.7  08/25/2023 1359   CL 104 08/25/2023 1359   CO2 24 08/25/2023 1359   GLUCOSE 131 (H) 08/25/2023 1359   BUN 12 08/25/2023 1359   CREATININE 0.90 08/25/2023 1359   CALCIUM  9.2 08/25/2023 1359   PROT 6.6 08/25/2023 1359   ALBUMIN 3.8 08/25/2023 1359   AST 47 (H) 08/25/2023 1359   ALT 10 08/25/2023 1359   ALKPHOS 93 08/25/2023 1359   BILITOT 0.6 08/25/2023 1359   GFRNONAA >60 08/25/2023 1359   Lab Results  Component Value Date   WBC 2.0 (L) 08/25/2023   NEUTROABS 1.3 (L) 08/25/2023   HGB 11.1 (L) 08/25/2023   HCT 32.0 (L) 08/25/2023   MCV 101.6 (H) 08/25/2023   PLT 160 08/25/2023    Imaging:  [facility] Clinician Interpretation: I have personally reviewed the CNS images as listed.  My interpretation, in the context of the patient's clinical presentation, is stable disease pending official read  CT ANGIO HEAD NECK W WO CM Result Date: 08/20/2023 CLINICAL DATA:  63 year old female with metastatic breast cancer. Neurologic deficit, TIA. Unable to have MRI due to current breast expander hardware. EXAM: CT ANGIOGRAPHY HEAD AND NECK WITH AND WITHOUT CONTRAST TECHNIQUE: Multidetector CT imaging of the head and neck was performed using the standard protocol during bolus administration of intravenous contrast. Multiplanar CT image reconstructions and MIPs were obtained to evaluate the vascular anatomy. Carotid stenosis measurements (when applicable) are obtained utilizing NASCET criteria, using the distal internal carotid diameter as the denominator. RADIATION DOSE REDUCTION: This exam was performed according  to the departmental dose-optimization program which includes automated exposure control, adjustment of the mA and/or kV according to patient size and/or use of iterative reconstruction technique. CONTRAST:  75mL OMNIPAQUE  IOHEXOL  350 MG/ML SOLN COMPARISON:  Head CT without contrast 1015 hours today. CT cervical spine 10/01/2022. FINDINGS: CT HEAD POST CONTRAST: Abnormal pachymeningeal thickening and enhancement along the left superior frontal convexity (series 7, image 58, series 12, image 24) subjacent to advanced calvarium skull metastases, including multifocal permeated appearance of the skull. No midline shift or significant intracranial mass effect at this time. No vasogenic cerebral edema by CT. No intra-axial enhancing metastasis identified by CT. Stable gray-white matter differentiation throughout the brain. CTA NECK Skeleton: Severe skeletal metastatic disease. Permeated appearance of multiple areas of the skull, the manubrium, cervical vertebrae including C3 which demonstrates a pathologic compression fracture on series 8, image 97, stable compared to prior cervical spine CT 10/01/2022. Upper chest: Right chest Port-A-Cath. No superior mediastinal mass or lymphadenopathy. No apical lung mass or nodule identified. Other neck: Nonvascular neck soft tissue spaces are within normal limits. Aortic arch: 3 vessel arch with mild motion artifact. No arch atherosclerosis. Right carotid system: Patent with no significant plaque or stenosis. But there is a beaded appearance of the right ICA distal to the bulb, regional ectasia and tortuosity of the vessel there in keeping with Fibromuscular Dysplasia (FMD). (Series 11, image 14). Left carotid system: Patent with tortuosity. No plaque or stenosis. More subtle beaded appearance of the left ICA distal to the bulb in keeping with FMD on series 11, image 25. Vertebral arteries: Patent with no atherosclerosis or stenosis. The left vertebral artery is dominant. CTA HEAD  Posterior circulation: Dominant distal left vertebral artery, the right V4 segment is diminutive distal to the patent right PICA. Normal left PICA. Patent vertebrobasilar junction and basilar artery without stenosis. Patent SCA and PCA origins. Posterior communicating arteries are diminutive or absent. Bilateral PCA branches are within normal limits. Anterior  circulation: Patent ICA siphons with no significant atherosclerosis or stenosis. There is subtle supraclinoid calcified plaque. Patent carotid termini. Patent MCA and ACA origins. Normal anterior communicating artery. Left ACA A2 segment appears dominant. Bilateral ACA branches are within normal limits. Left MCA trifurcation, right MCA bifurcation, bilateral MCA branches are within normal limits. Venous sinuses: Abnormal packing meninges suspected as above. And early venous contrast timing. But the superior sagittal sinus is enhancing and appears patent. Anatomic variants: Dominant left vertebral artery, especially the left V4 segment. Dominant left ACA A2. Review of the MIP images confirms the above findings IMPRESSION: 1. Negative for large vessel occlusion or significant atherosclerosis in the head or neck. 2. Positive for abnormal pachymeningeal thickening and enhancement along the left superior frontal convexity, subjacent to severe skull metastases, compatible with Pachymeningeal Carcinomatosis. No intracranial mass effect or cerebral edema by CT. This was discussed by telephone with Dr. Baldwin Levee on 08/20/2023 at 1309 hours. 3. 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 4. 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 Electronically Signed   By: Marlise Simpers M.D.   On: 08/20/2023 13:33   US  Venous Img Lower Bilateral Result Date: 08/20/2023 CLINICAL DATA:  Edema.  History of metastatic breast cancer EXAM: Bilateral Lower  Extremity Venous Doppler Ultrasound TECHNIQUE: Gray-scale sonography with compression, as well as color and duplex ultrasound, were performed to evaluate the deep venous system(s) from the level of the common femoral vein through the popliteal and proximal calf veins. COMPARISON:  12/04/2022 FINDINGS: VENOUS Normal compressibility of the common femoral, superficial femoral, and popliteal veins, as well as the visualized calf veins. Visualized portions of profunda femoral vein and great saphenous vein unremarkable. No filling defects to suggest DVT on grayscale or color Doppler imaging. Doppler waveforms show normal direction of venous flow, normal respiratory plasticity and response to augmentation. OTHER None. Limitations: none IMPRESSION: No lower extremity DVT. Electronically Signed   By: Elester Grim M.D.   On: 08/20/2023 12:37   CT HEAD WO CONTRAST Result Date: 08/20/2023 CLINICAL DATA:  TIA, slurred speech. EXAM: CT HEAD WITHOUT CONTRAST TECHNIQUE: Contiguous axial images were obtained from the base of the skull through the vertex without intravenous contrast. RADIATION DOSE REDUCTION: This exam was performed according to the departmental dose-optimization program which includes automated exposure control, adjustment of the mA and/or kV according to patient size and/or use of iterative reconstruction technique. COMPARISON:  November 17, 2022 FINDINGS: Brain: No evidence of acute infarction, hemorrhage, hydrocephalus or extra-axial collection. The areas of soft tissue seen on the prior contrast enhanced study, extending along the inner and outer table of the right frontal calvarium and inner table of the left frontal and parietal bones, are not clearly identified on the current noncontrast exam. Vascular: No hyperdense vessel or unexpected calcification. Skull: Extensive, predominantly stable areas of osseous metastasis are again noted throughout the calvarium, most prominently seen within the bilateral  frontal bones and right paramidline occipital bone. Sinuses/Orbits: A 1.8 cm x 0.7 cm x 1.4 cm area of enhancing soft tissue attenuation (approximately 45.31 Hounsfield units) is seen along the lateral aspect of the right globe (axial CT images 1 through 3, CT series 2). This represents a new finding when compared to the prior study. Other: Multiple areas of soft tissue attenuation are again seen throughout the scalp. These areas are mildly increased in size and number when compared to the  prior study. IMPRESSION: 1. No acute intracranial abnormality. 2. Extensive, predominantly stable areas of osseous metastasis throughout the calvarium. 3. New area of enhancing soft tissue attenuation along the lateral aspect of the right globe, as described above. MRI correlation is recommended. 4. Multiple areas of soft tissue attenuation throughout the scalp, mildly increased in size and number when compared to the prior study. Mild interval progression of metastatic disease cannot be excluded. Electronically Signed   By: Virgle Grime M.D.   On: 08/20/2023 10:40     Assessment/Plan Focal seizure (HCC)  Lacey Pian presents with clinical syndrome most c/w focal seizures.  This is based on clinical history, semiology, risk factors, and CNS imaging.  CT head demonstrates thick pachymeningeal/dural enhancement; etiology is either reactive from active osseous/calvarial disease or organic spread of neoplasm onto dural structures.  We could theoretically associate this radiographic change with the seizures themselves give semiology of speech arrest.    MRI brain remains unavailable because of expander in place, though patient is interested in getting this removed.  Recommended the following: -Increase Keppra  to 750mg  BID given repeat seizure while on 500mg  BID -Counseled on epilepsy safety and driving restrictions -Review imaging with CNS tumor board -If concern for organic spread of tumor along dura is  heightened, could consider focal radiation or resumption of HER2 therapy, if amenabe with medical oncology -Tentatively plan on repeating CT head with contrast in 2 months, pending tumor board input.  MRI would be ultimately preferable.  She is agreeable with the above.  We will touch base with Dr. Gudena as well.    We appreciate the opportunity to participate in the care of TUMEKA CHIMENTI.  We will give her a call next week following our discussion with any amendments to the above recommendations.  All questions were answered. The patient knows to call the clinic with any problems, questions or concerns. No barriers to learning were detected.  The total time spent in the encounter was 40 minutes and more than 50% was on counseling and review of test results   Mamie Searles, MD Medical Director of Neuro-Oncology [facility] [place] 09/02/23 2:30 PM

## 2023-09-02 NOTE — Progress Notes (Signed)
 Symptom Management Consult Note [facility]    Patient Care Team: Elester Grim, MD as PCP - General (Internal Medicine) Cameron Cea, MD as Consulting Physician (Hematology and Oncology) Lockie Rima, MD as Consulting Physician (General Surgery) Johna Myers, MD as Consulting Physician (Radiation Oncology) Glenis Langdon, MD as Consulting Physician (Radiation Oncology)    Name / MRN / DOB: Kataryna Mcquilkin  [id number]  1960-12-13   Date of visit: 09/02/2023   Chief Complaint/Reason for visit: rash   Current Therapy: Verzenio  and Faslodex  (last Faslodex  administered 08/11/23)    ASSESSMENT AND PLAN Patient is a 63 y.o. female with oncologic history of malignant neoplasm of lower-outer quadrant of left breast of female, estrogen receptor positive followed by Dr. Lee Public.  I have viewed most recent oncology note and lab work.  #Malignant neoplasm of lower-outer quadrant of left breast of female, estrogen receptor positive  - Next appointment with clinical pharmacist is 09/08/23- Verzenio  dose recently reduced 2/2 leukopenia, - Patient prescribed keflex  500 mg BID x 7 days and completed course this morning. - Chart review shows patient had biopsy of skin lesions on 06/04/23 that showed adenocarcinoma favoring breast primary. Patient's treatment was changed as a result.  - PE today is not suggestive of infection. There is no fluctuance or drainage from wounds. She has no consitutional symptoms either. No indication for additional antibiotic treatment at this time. - Patient agreeable to radiation oncology referral for skin mets. Referral sent.   Strict ED precautions discussed should symptoms worsen.   HEME/ONC HISTORY Oncology History  Malignant neoplasm of lower-outer quadrant of left breast of female, estrogen receptor positive (HCC)  06/11/2016 Mammogram   Palpable left breast masses 3:00 position: 2.2 cm; 5:30 position: 2.5 cm; 6:30 position: 0.7 cm    06/19/2016 Initial Diagnosis   Left breast biopsy 3:30: IDC with DCIS grade 1, ER 90%, PR 50%, Ki-67 15%, HER-2 negative ratio 1.13; biopsy 5:30 position: IDC grade 1   07/13/2016 Breast MRI   Large area of abnormal enhancement lower inner and lower outer quadrants left breast spanning 9 cm x 6.4 cm x 5.3 cm, no abnormal enlarged lymph nodes; T3 N0 stage II a (New AJCC staging)    07/15/2016 - 12/08/2017 Anti-estrogen oral therapy   Neoadjuvant anastrozole  1 mg daily   07/17/2016 Oncotype testing   Testing done on the biopsy: Oncotype DX score 22, intermediate risk   02/02/2017 Breast MRI   Left breast multicentric disease unchanged measuring 2.7 x 1.6 cm.  Mass in the non-mass enhancement are also not significantly changed measuring 6.2 x 2.4 cm. new enhancing mass within the outer right breast 7 mm which could be fat necrosis or inclusion cyst    02/09/2017 Imaging   Ultrasound of the right breast lesion noted on MRI: No sonographic finding corresponds to the abnormality noted on MRI   07/13/2017 Cancer Staging   Staging form: Breast, AJCC 8th Edition - Clinical stage from 07/13/2017: Stage IIA (cT3, cN0, cM0, G1, ER+, PR+, HER2-) - Signed by Percival Brace, NP on 05/18/2018   10/26/2017 Surgery   Left mastectomy: IDC grade 1, 2 foci largest spans 8.5 cm, intermediate grade DCIS, lymphovascular invasion identified, perineural invasion identified, 1/2 lymph nodes positive with extracapsular extension, ER 9200%, PR 5 to 50%, HER-2 negative, Ki-67 10 to 15%, T3N1A Mammaprint: low risk   11/02/2017 Cancer Staging   Staging form: Breast, AJCC 8th Edition - Pathologic: No Stage Recommended (ypT3, pN1a, cM0, G1, ER+,  PR+, HER2-) - Signed by Cameron Cea, MD on 11/02/2017   12/08/2017 - 01/26/2018 Radiation Therapy   Adjuvant radiation therapy    02/2018 -  Anti-estrogen oral therapy   Anastrozole  1 mg daily adjuvant therapy   10/21/2022 - 07/06/2023 Chemotherapy   Patient is on Treatment  Plan : BREAST METASTATIC Fam-Trastuzumab Deruxtecan-nxki  (Enhertu ) (5.4) q21d         INTERVAL HISTORY  Discussed the use of AI scribe software for clinical note transcription with the patient, who gave verbal consent to proceed.    Kathryn Lucas is a 63 y.o. female with oncologic history as above presenting to [facility] today with chief complaint of rash. Patient presents unaccompanied to visit today.  Patient reports she has had persistent spots on her back that are improving but still present. Initially, the spots were raised and painful, making it difficult for her to sleep on her back. She describes them as resembling 'two fishes together' and notes they are now drying up. She has been applying Neosporin and completed a course of Keflex , which she believes has helped. No fever is reported, and she remains active and feels to be in good health. The spots are not currently draining and are not tender to touch, although they have been painful in the past. She denies any fever or chills.      ROS  All other systems are reviewed and are negative for acute change except as noted in the HPI.    Allergies  Allergen Reactions   Percocet [Oxycodone -Acetaminophen ] Nausea And Vomiting    Severe GI upset and vomiting     Past Medical History:  Diagnosis Date   Breast cancer (HCC)    left breast cancer   Cancer (HCC) 09/2017   left breast cancer   Family history of breast cancer    Hypothyroidism    Personal history of radiation therapy 2019   Thyroid  disease      Past Surgical History:  Procedure Laterality Date   BREAST BIOPSY Left 2018   BREAST BIOPSY Left 2019   BREAST RECONSTRUCTION WITH PLACEMENT OF TISSUE EXPANDER AND ALLODERM Left 10/26/2017   Procedure: LEFT BREAST RECONSTRUCTION WITH PLACEMENT OF TISSUE EXPANDER AND ALLODERM;  Surgeon: Alger Infield, MD;  Location: [facility];  Service: Plastics;  Laterality: Left;   IR IMAGING GUIDED PORT INSERTION   04/27/2023   MASTECTOMY Left 2019   MASTECTOMY WITH RADIOACTIVE SEED GUIDED EXCISION AND AXILLARY SENTINEL LYMPH NODE BIOPSY Left 10/26/2017   Procedure: LEFT MASTECTOMY WITH SEED TARGETED  LEFT AXILLARY LYMPH NODE EXCISION AND LEFT SENTINEL LYMPH NODE BIOPSY;  Surgeon: Lockie Rima, MD;  Location: [facility];  Service: General;  Laterality: Left;   TONSILLECTOMY     WISDOM TOOTH EXTRACTION      Social History   Socioeconomic History   Marital status: Single    Spouse name: Not on file   Number of children: Not on file   Years of education: Not on file   Highest education level: Not on file  Occupational History   Not on file  Tobacco Use   Smoking status: Never   Smokeless tobacco: Never  Vaping Use   Vaping status: Never Used  Substance and Sexual Activity   Alcohol use: Yes    Alcohol/week: 0.0 standard drinks of alcohol    Comment: social   Drug use: Never   Sexual activity: Not on file  Other Topics Concern   Not on file  Social  History Narrative   Lives alone and one dog.   Social Drivers of Corporate investment banker Strain: Not on file  Food Insecurity: No Food Insecurity (09/14/2022)   Hunger Vital Sign    Worried About Running Out of Food in the Last Year: Never true    Ran Out of Food in the Last Year: Never true  Transportation Needs: No Transportation Needs (09/14/2022)   PRAPARE - Administrator, Civil Service (Medical): No    Lack of Transportation (Non-Medical): No  Physical Activity: Not on file  Stress: Not on file  Social Connections: Not on file  Intimate Partner Violence: Not At Risk (09/14/2022)   Humiliation, Afraid, Rape, and Kick questionnaire    Fear of Current or Ex-Partner: No    Emotionally Abused: No    Physically Abused: No    Sexually Abused: No    Family History  Problem Relation Age of Onset   Breast cancer Mother 21   Stroke Sister        thought to be due to tamoxifen use   Dementia Maternal  Grandmother    COPD Paternal Grandfather      Current Outpatient Medications:    abemaciclib  (VERZENIO ) 50 MG tablet, Take 1 tablet (50 mg total) by mouth 2 (two) times daily., Disp: 60 tablet, Rfl: 3   Calcium  500-100 MG-UNIT CHEW, Chew 1 tablet by mouth daily., Disp: 60 tablet, Rfl:    cephALEXin  (KEFLEX ) 500 MG capsule, Take 1 capsule (500 mg total) by mouth 2 (two) times daily., Disp: 14 capsule, Rfl: 0   cholecalciferol (VITAMIN D3) 25 MCG (1000 UNIT) tablet, Take 1 tablet (1,000 Units total) by mouth daily., Disp: , Rfl:    furosemide  (LASIX ) 20 MG tablet, Take 1 tablet (20 mg total) by mouth daily. If needed may take 1/2 tab q am (Patient taking differently: Take 1 tablet (20 mg total) by mouth daily. If needed may take 1/2 tab q am), Disp: 30 tablet, Rfl: 0   levETIRAcetam  (KEPPRA ) 750 MG tablet, Take 1 tablet (750 mg total) by mouth 2 (two) times daily., Disp: 60 tablet, Rfl: 2   lidocaine -prilocaine  (EMLA ) cream, Apply 1 Application topically as needed. Apply one hour prior to port a cath access (Patient not taking: Reported on 09/02/2023), Disp: 30 g, Rfl: 1   LORazepam  (ATIVAN ) 0.5 MG tablet, Take 1 tablet (0.5 mg total) by mouth at bedtime. (Patient not taking: Reported on 09/02/2023), Disp: 30 tablet, Rfl: 0   omeprazole  (PRILOSEC) 20 MG capsule, TAKE 1 CAPSULE BY MOUTH ONCE DAILY, Disp: 30 capsule, Rfl: 3   SYNTHROID  125 MCG tablet, TAKE 1 TABLET BY MOUTH ONCE DAILY BEFOREBREAKFAST, Disp: 90 tablet, Rfl: 0   vitamin C  (ASCORBIC ACID) 250 MG tablet, Take 1 tablet (250 mg total) by mouth daily., Disp: , Rfl:    Zinc  Acetate, Oral, (ZINC  ACETATE PO), Take by mouth., Disp: , Rfl:   PHYSICAL EXAM ECOG FS:1 - Symptomatic but completely ambulatory    Vitals:   09/02/23 1415 09/02/23 1500  BP: (!) 129/59   Pulse: (!) 120 (!) 112  Resp: 20   Temp: 97.9 F (36.6 C)   SpO2: 96%    Physical Exam Vitals and nursing note reviewed.  Constitutional:      Appearance: She is not  ill-appearing or toxic-appearing.  HENT:     Head: Normocephalic.   Eyes:     Conjunctiva/sclera: Conjunctivae normal.    Cardiovascular:     Rate and  Rhythm: Regular rhythm. Tachycardia present.  Pulmonary:     Effort: Pulmonary effort is normal.  Abdominal:     General: There is no distension.   Musculoskeletal:     Cervical back: Normal range of motion.   Skin:    General: Skin is warm and dry.     Comments: Please see media below. No palpable fluctuance. Non tender. No drainage.   Neurological:     Mental Status: She is alert.           LABORATORY DATA I have reviewed the data as listed    Latest Ref Rng & Units 08/25/2023    1:59 PM 08/20/2023   10:36 AM 08/11/2023    2:17 PM  CBC  WBC 4.0 - 10.5 K/uL 2.0  1.1  1.8   Hemoglobin 12.0 - 15.0 g/dL 19.1  9.6  47.8   Hematocrit 36.0 - 46.0 % 32.0  28.9  32.8   Platelets 150 - 400 K/uL 160  125  129         Latest Ref Rng & Units 08/25/2023    1:59 PM 08/20/2023   10:36 AM 08/11/2023    1:21 PM  CMP  Glucose 70 - 99 mg/dL 295  90  621   BUN 8 - 23 mg/dL 12  11  11    Creatinine 0.44 - 1.00 mg/dL 3.08  6.57  8.46   Sodium 135 - 145 mmol/L 136  137  138   Potassium 3.5 - 5.1 mmol/L 3.7  3.8  4.0   Chloride 98 - 111 mmol/L 104  105  107   CO2 22 - 32 mmol/L 24  21  24    Calcium  8.9 - 10.3 mg/dL 9.2  8.7  8.8   Total Protein 6.5 - 8.1 g/dL 6.6  5.9  5.7   Total Bilirubin 0.0 - 1.2 mg/dL 0.6  0.8  0.5   Alkaline Phos 38 - 126 U/L 93  82  106   AST 15 - 41 U/L 47  47  47   ALT 0 - 44 U/L 10  12  11         RADIOGRAPHIC STUDIES (from last 24 hours if applicable) I have personally reviewed the radiological images as listed and agreed with the findings in the report. No results found.      Visit Diagnosis: 1. Metastatic malignant neoplasm, unspecified site [facility])      Orders Placed This Encounter  Procedures   Ambulatory referral to Radiation Oncology    Referral Priority:   Urgent    Referral Type:    Consultation    Referral Reason:   Specialty Services Required    Requested Specialty:   Radiation Oncology    Number of Visits Requested:   1    All questions were answered. The patient knows to call the clinic with any problems, questions or concerns. No barriers to learning was detected.  A total of more than 30 minutes were spent on this encounter with face-to-face time and non-face-to-face time, including preparing to see the patient, ordering tests and/or medications, counseling the patient and coordination of care as outlined above.    Thank you for allowing me to participate in the care of this patient.    Dawn Kiper E  Walisiewicz, PA-C Department of Hematology/Oncology [facility] at [facility] Phone: [phone]  [phone]    09/02/2023 3:43 PM

## 2023-09-03 ENCOUNTER — Encounter: Payer: Self-pay | Admitting: Hematology and Oncology

## 2023-09-03 ENCOUNTER — Telehealth: Payer: Self-pay | Admitting: Radiation Oncology

## 2023-09-03 ENCOUNTER — Other Ambulatory Visit: Payer: Self-pay | Admitting: Radiation Therapy

## 2023-09-03 NOTE — Telephone Encounter (Signed)
 Called patient to schedule a FUN appt w. PA Richad Champagne. Patient stated she was unavailable and would like a call back on Monday.

## 2023-09-06 ENCOUNTER — Encounter

## 2023-09-06 ENCOUNTER — Telehealth: Payer: Self-pay | Admitting: Genetic Counselor

## 2023-09-06 ENCOUNTER — Telehealth: Payer: Self-pay | Admitting: Radiation Oncology

## 2023-09-06 ENCOUNTER — Encounter: Payer: Self-pay | Admitting: Hematology and Oncology

## 2023-09-06 NOTE — Telephone Encounter (Signed)
 Received call from pt requesting to be seen and treated at [facility] due to distance from home. I advised we would need to run this by tx team due to urgency of appt and location of cancer (brain) since my knowledge is limited about all the treatments [place] offers. Pt was fine with appts remaining until this information could be verified and she knows she can transfer to receive appropriate care. Pt was advised she would receive a call back from our team with confirmation prior to appt 6/26.

## 2023-09-06 NOTE — Telephone Encounter (Signed)
 The patient is on the brain tumor board.  I messaged Dr. Odean in 2024 to let him know that the patient qualified for genetic testing.  She has declined testing in the past.  Messaged again, just in case she changed her mind.

## 2023-09-07 ENCOUNTER — Telehealth: Payer: Self-pay | Admitting: Radiation Oncology

## 2023-09-07 NOTE — Assessment & Plan Note (Signed)
 10/26/17: Left mastectomy: IDC grade 1, 2 foci largest spans 8.5 cm, intermediate grade DCIS, lymphovascular invasion identified, perineural invasion identified, 1/2 lymph nodes positive with extracapsular extension, ER 9200%, PR 5 to 50%, HER-2 negative, Ki-67 10 to 15%, T3N1A   Oncotype DX score 22, intermediate risk, chemotherapy not felt to have significant benefit.   Treatment Summary: 1. Antiestrogen therapy with anastrozole  1 mg daily started 07/15/2016 2. Mastectomy 10/26/2017, Mammaprint low risk luminal type A 3. Followed by adjuvant radiation 12/08/17- 01/26/18  4. Followed by adjuvant antiestrogen therapy anastrozole  started 01/17/2018 (originally started 07/15/2016) 5.  August 2023: Low back pain: Large lesion in the sacrum biopsy of sacrum: 12/26/2021: Metastatic breast cancer, ER 90%, PR 10%, HER2 negative (0) 6. Ibrance  along with Faslodex  started 12/25/2021-10/14/2022 7. Palliative radiation to the sacrum completed 01/19/2022 8.  Enhertu  started 10/21/2022-07/06/2023 9.  Skin biopsy: 06/14/2023: Poorly differentiated adenocarcinoma x 2 biopsies: ER 99%, PR 2%, HER2 1+, Ki-67 25%, Caris molecular testing: HER2 low, hormone receptor positive, ESR 1 mutation (no PIK3CA, no PALB2, no PD-L1, TMB 4) --------------------------------------------------------------------   Current treatment: Verzinio with Faslodex  started 07/16/2023 CT CAP 05/21/2023: Extensive bone metastases similar, low-attenuation liver lesion not clearly seen previously (liver MRI suggested)   Verzinio toxicities: Tolerating fairly well.  Intermittent loose stools but not causing concern. Fatigue Blood counts have been reviewed.  Although the WBC count is low but neutrophil count is stable.  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

## 2023-09-07 NOTE — Progress Notes (Signed)
 This apt has been canceled.   Location/Histology of Tumor: LT Breast cancer metastasized to multiple sites, including skin mets to upper back (primary discussion).  Patient presented with symptoms of: Focal Seizures, slurred speech, skin lesions  Past or anticipated interventions, if any, per neurosurgery: No  Dr. Odean Assessment & Plan 08/25/2023 Malignant neoplasm of lower-outer quadrant of left breast, estrogen receptor positive Treatment regimen adjusted due to leukopenia. Issues with medication adherence due to vomiting and potential seizure activity. Concerns about treatment side effects. - Adjust treatment based on lab results and tolerance. - Perform a scan after three months to assess response.   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 - Consult with neuro-oncologist Dr. Buckley for further recommendations.   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 Decision to adjust dosage to address low counts. - Discontinue current dose of Verzenio . - Initiate Verzenio  at 50 mg twice daily once available. - Monitor blood counts and adjust treatment as necessary.   Seizure disorder Recent episode of slurred speech and inability to speak, possibly related to seizure activity. Not seen by Dr. Merdis since September. - Send a note to Dr. Buckley for further evaluation and management.  09/02/2023 Dr. Buckley Recommended the following: -Increase Keppra  to 750mg  BID given repeat seizure while on 500mg  BID -Counseled on epilepsy safety and driving restrictions -Review imaging with CNS tumor board -If concern for organic spread of tumor along dura is heightened, could consider focal radiation or resumption of HER2 therapy, if amenabe with medical oncology -Tentatively plan on repeating CT head with contrast in 2 months, pending tumor board input.  MRI would be ultimately  preferable.  Past or anticipated interventions, if any, per medical oncology: 09/02/2023 Kathryn Lucas, Kathryn E, PA-C   Current Therapy: Verzenio  and Faslodex  (last Faslodex  administered 08/11/23) ASSESSMENT AND PLAN Patient is a 63 y.o. female with oncologic history of malignant neoplasm of lower-outer quadrant of left breast of female, estrogen receptor positive followed by Dr. Odean.  I have viewed most recent oncology note and lab work.   #Malignant neoplasm of lower-outer quadrant of left breast of female, estrogen receptor positive  - Next appointment with clinical pharmacist is 09/08/23- Verzenio  dose recently reduced 2/2 leukopenia, - Patient prescribed keflex  500 mg BID x 7 days and completed course this morning. - Chart review shows patient had biopsy of skin lesions on 06/04/23 that showed adenocarcinoma favoring breast primary. Patient's treatment was changed as a result.  - PE today is not suggestive of infection. There is no fluctuance or drainage from wounds. She has no consitutional symptoms either. No indication for additional antibiotic treatment at this time. - Patient agreeable to radiation oncology referral for skin mets. Referral sent. -Strict ED precautions discussed should symptoms worsen.   Dose of Decadron , if applicable: *  Recent neurologic symptoms, if any:  Skin: * Speech: * Seizures: * Headaches: * Nausea: * Dizziness/ataxia: * Difficulty with hand coordination: * Focal numbness/weakness: * Visual deficits/changes: * Confusion/Memory deficits: *  Painful bone metastases at present, if any: Spine  SAFETY ISSUES: Prior radiation? Yes- Lt breast, spine Pacemaker/ICD? No Possible current pregnancy? No- Postmenopausal  Is the patient on methotrexate? No  Additional Complaints / other details: Dermatology/pathology report 06/18/2023   Vitals: *  This concludes the interaction.  Kathryn Minerva, Kathryn Lucas

## 2023-09-07 NOTE — Telephone Encounter (Signed)
 Per Kathryn Lucas patient stated she would like to be seen closer to home. Patient now scheduled for consult w. Dr. Lenn in [place], all appointments w. Dr. Dewey are cancelled. Closing referral at this time.

## 2023-09-08 ENCOUNTER — Inpatient Hospital Stay (HOSPITAL_BASED_OUTPATIENT_CLINIC_OR_DEPARTMENT_OTHER): Admitting: Hematology and Oncology

## 2023-09-08 ENCOUNTER — Inpatient Hospital Stay

## 2023-09-08 ENCOUNTER — Inpatient Hospital Stay: Admitting: Pharmacist

## 2023-09-08 ENCOUNTER — Encounter: Payer: Self-pay | Admitting: Internal Medicine

## 2023-09-08 VITALS — BP 113/87 | HR 98 | Temp 99.2°F | Resp 17 | Wt 147.2 lb

## 2023-09-08 DIAGNOSIS — Z170 Estrogen receptor positive status [ER+]: Secondary | ICD-10-CM

## 2023-09-08 DIAGNOSIS — C799 Secondary malignant neoplasm of unspecified site: Secondary | ICD-10-CM

## 2023-09-08 DIAGNOSIS — C50512 Malignant neoplasm of lower-outer quadrant of left female breast: Secondary | ICD-10-CM | POA: Diagnosis not present

## 2023-09-08 DIAGNOSIS — Z5111 Encounter for antineoplastic chemotherapy: Secondary | ICD-10-CM | POA: Diagnosis not present

## 2023-09-08 LAB — CBC WITH DIFFERENTIAL (CANCER CENTER ONLY)
Abs Immature Granulocytes: 0.01 10*3/uL (ref 0.00–0.07)
Basophils Absolute: 0 10*3/uL (ref 0.0–0.1)
Basophils Relative: 2 %
Eosinophils Absolute: 0 10*3/uL (ref 0.0–0.5)
Eosinophils Relative: 1 %
HCT: 28.2 % — ABNORMAL LOW (ref 36.0–46.0)
Hemoglobin: 9.9 g/dL — ABNORMAL LOW (ref 12.0–15.0)
Immature Granulocytes: 1 %
Lymphocytes Relative: 22 %
Lymphs Abs: 0.3 10*3/uL — ABNORMAL LOW (ref 0.7–4.0)
MCH: 35.6 pg — ABNORMAL HIGH (ref 26.0–34.0)
MCHC: 35.1 g/dL (ref 30.0–36.0)
MCV: 101.4 fL — ABNORMAL HIGH (ref 80.0–100.0)
Monocytes Absolute: 0.2 10*3/uL (ref 0.1–1.0)
Monocytes Relative: 11 %
Neutro Abs: 1 10*3/uL — ABNORMAL LOW (ref 1.7–7.7)
Neutrophils Relative %: 63 %
Platelet Count: 161 10*3/uL (ref 150–400)
RBC: 2.78 MIL/uL — ABNORMAL LOW (ref 3.87–5.11)
RDW: 17 % — ABNORMAL HIGH (ref 11.5–15.5)
WBC Count: 1.6 10*3/uL — ABNORMAL LOW (ref 4.0–10.5)
nRBC: 0 % (ref 0.0–0.2)

## 2023-09-08 LAB — CMP (CANCER CENTER ONLY)
ALT: 14 U/L (ref 0–44)
AST: 85 U/L — ABNORMAL HIGH (ref 15–41)
Albumin: 3.7 g/dL (ref 3.5–5.0)
Alkaline Phosphatase: 98 U/L (ref 38–126)
Anion gap: 7 (ref 5–15)
BUN: 10 mg/dL (ref 8–23)
CO2: 25 mmol/L (ref 22–32)
Calcium: 9.5 mg/dL (ref 8.9–10.3)
Chloride: 104 mmol/L (ref 98–111)
Creatinine: 0.79 mg/dL (ref 0.44–1.00)
GFR, Estimated: >60 mL/min (ref >60)
Glucose, Bld: 99 mg/dL (ref 70–99)
Potassium: 3.9 mmol/L (ref 3.5–5.1)
Sodium: 136 mmol/L (ref 135–145)
Total Bilirubin: 0.5 mg/dL (ref 0.0–1.2)
Total Protein: 6.5 g/dL (ref 6.5–8.1)

## 2023-09-08 MED ORDER — DENOSUMAB 120 MG/1.7ML ~~LOC~~ SOLN
120.0000 mg | Freq: Once | SUBCUTANEOUS | Status: AC
Start: 2023-09-08 — End: 2023-09-08
  Administered 2023-09-08: 120 mg via SUBCUTANEOUS

## 2023-09-08 MED ORDER — SODIUM CHLORIDE 0.9% FLUSH
10.0000 mL | Freq: Once | INTRAVENOUS | Status: AC
  Administered 2023-09-08: 10 mL

## 2023-09-08 MED ORDER — HEPARIN SOD (PORK) LOCK FLUSH 100 UNIT/ML IV SOLN
500.0000 [IU] | Freq: Once | INTRAVENOUS | Status: AC
  Administered 2023-09-08: 500 [IU]

## 2023-09-08 MED ORDER — FULVESTRANT 250 MG/5ML IM SOSY
500.0000 mg | PREFILLED_SYRINGE | Freq: Once | INTRAMUSCULAR | Status: AC
  Administered 2023-09-08: 500 mg via INTRAMUSCULAR

## 2023-09-08 NOTE — Progress Notes (Signed)
 Patient Care Team: Vernon Velna SAUNDERS, MD as PCP - General (Internal Medicine) Odean Potts, MD as Consulting Physician (Hematology and Oncology) Aron Shoulders, MD as Consulting Physician (General Surgery) Dewey Rush, MD as Consulting Physician (Radiation Oncology) Lenn Aran, MD as Consulting Physician (Radiation Oncology) Lenn Aran, MD as Consulting Physician (Radiation Oncology)  DIAGNOSIS:  Encounter Diagnosis  Name Primary?   Malignant neoplasm of lower-outer quadrant of left breast of female, estrogen receptor positive (HCC) Yes    SUMMARY OF ONCOLOGIC HISTORY: Oncology History  Malignant neoplasm of lower-outer quadrant of left breast of female, estrogen receptor positive (HCC)  06/11/2016 Mammogram   Palpable left breast masses 3:00 position: 2.2 cm; 5:30 position: 2.5 cm; 6:30 position: 0.7 cm   06/19/2016 Initial Diagnosis   Left breast biopsy 3:30: IDC with DCIS grade 1, ER 90%, PR 50%, Ki-67 15%, HER-2 negative ratio 1.13; biopsy 5:30 position: IDC grade 1   07/13/2016 Breast MRI   Large area of abnormal enhancement lower inner and lower outer quadrants left breast spanning 9 cm x 6.4 cm x 5.3 cm, no abnormal enlarged lymph nodes; T3 N0 stage II a (New AJCC staging)    07/15/2016 - 12/08/2017 Anti-estrogen oral therapy   Neoadjuvant anastrozole  1 mg daily   07/17/2016 Oncotype testing   Testing done on the biopsy: Oncotype DX score 22, intermediate risk   02/02/2017 Breast MRI   Left breast multicentric disease unchanged measuring 2.7 x 1.6 cm.  Mass in the non-mass enhancement are also not significantly changed measuring 6.2 x 2.4 cm. new enhancing mass within the outer right breast 7 mm which could be fat necrosis or inclusion cyst    02/09/2017 Imaging   Ultrasound of the right breast lesion noted on MRI: No sonographic finding corresponds to the abnormality noted on MRI   07/13/2017 Cancer Staging   Staging form: Breast, AJCC 8th Edition - Clinical stage  from 07/13/2017: Stage IIA (cT3, cN0, cM0, G1, ER+, PR+, HER2-) - Signed by Crawford Morna Pickle, NP on 05/18/2018   10/26/2017 Surgery   Left mastectomy: IDC grade 1, 2 foci largest spans 8.5 cm, intermediate grade DCIS, lymphovascular invasion identified, perineural invasion identified, 1/2 lymph nodes positive with extracapsular extension, ER 9200%, PR 5 to 50%, HER-2 negative, Ki-67 10 to 15%, T3N1A Mammaprint: low risk   11/02/2017 Cancer Staging   Staging form: Breast, AJCC 8th Edition - Pathologic: No Stage Recommended (ypT3, pN1a, cM0, G1, ER+, PR+, HER2-) - Signed by Odean Potts, MD on 11/02/2017   12/08/2017 - 01/26/2018 Radiation Therapy   Adjuvant radiation therapy    02/2018 -  Anti-estrogen oral therapy   Anastrozole  1 mg daily adjuvant therapy   10/21/2022 - 07/06/2023 Chemotherapy   Patient is on Treatment Plan : BREAST METASTATIC Fam-Trastuzumab Deruxtecan-nxki  (Enhertu ) (5.4) q21d       CHIEF COMPLIANT: Follow-up of metastatic breast cancer on Verzinio, complains of seizures  HISTORY OF PRESENT ILLNESS: History of Present Illness Kathryn Lucas is a 63 year old female with brain cancer who presents with ongoing seizures and fatigue.  She experiences seizures lasting about five minutes, followed by confusion for ten minutes, occurring every other day. She takes Kionex 750 mg, which contributes to significant fatigue.  She feels extremely fatigued, attributing this to her seizure medication and possibly her cancer. She takes Verzenio  50 mg twice daily, reduced from 100 mg due to low blood counts. Her hemoglobin is 9.9, and her white blood cell count is 1.6. Constipation from the seizure  medication and diarrhea from Verzenio  are present.  There is swelling near her eye, present since March, suspected to be cancerous by her eye doctor. She is concerned about potential radiation treatment for her eye.     ALLERGIES:  is allergic to percocet  [oxycodone -acetaminophen ].  MEDICATIONS:  Current Outpatient Medications  Medication Sig Dispense Refill   abemaciclib  (VERZENIO ) 50 MG tablet Take 1 tablet (50 mg total) by mouth 2 (two) times daily. 60 tablet 3   Calcium  500-100 MG-UNIT CHEW Chew 1 tablet by mouth daily. 60 tablet    cephALEXin  (KEFLEX ) 500 MG capsule Take 1 capsule (500 mg total) by mouth 2 (two) times daily. 14 capsule 0   cholecalciferol (VITAMIN D3) 25 MCG (1000 UNIT) tablet Take 1 tablet (1,000 Units total) by mouth daily.     furosemide  (LASIX ) 20 MG tablet Take 1 tablet (20 mg total) by mouth daily. If needed may take 1/2 tab q am (Patient taking differently: Take 1 tablet (20 mg total) by mouth daily. If needed may take 1/2 tab q am) 30 tablet 0   levETIRAcetam  (KEPPRA ) 750 MG tablet Take 1 tablet (750 mg total) by mouth 2 (two) times daily. 60 tablet 2   lidocaine -prilocaine  (EMLA ) cream Apply 1 Application topically as needed. Apply one hour prior to port a cath access (Patient not taking: Reported on 09/02/2023) 30 g 1   LORazepam  (ATIVAN ) 0.5 MG tablet Take 1 tablet (0.5 mg total) by mouth at bedtime. (Patient not taking: Reported on 09/02/2023) 30 tablet 0   omeprazole  (PRILOSEC) 20 MG capsule TAKE 1 CAPSULE BY MOUTH ONCE DAILY 30 capsule 3   SYNTHROID  125 MCG tablet TAKE 1 TABLET BY MOUTH ONCE DAILY BEFOREBREAKFAST 90 tablet 0   vitamin C  (ASCORBIC ACID) 250 MG tablet Take 1 tablet (250 mg total) by mouth daily.     Zinc  Acetate, Oral, (ZINC  ACETATE PO) Take by mouth.     No current facility-administered medications for this visit.    PHYSICAL EXAMINATION: ECOG PERFORMANCE STATUS: 1 - Symptomatic but completely ambulatory  Vitals:   09/08/23 1323  BP: 113/87  Pulse: 98  Resp: 17  Temp: 99.2 F (37.3 C)  SpO2: 100%   Filed Weights   09/08/23 1323  Weight: 147 lb 3.2 oz (66.8 kg)    Physical Exam Skin lesions palpable on the back appear to be stable, palpable nodularity towards the right side of the  orbit is also likely a skin metastasis  (exam performed in the presence of a chaperone)  LABORATORY DATA:  I have reviewed the data as listed    Latest Ref Rng & Units 09/08/2023   12:48 PM 08/25/2023    1:59 PM 08/20/2023   10:36 AM  CMP  Glucose 70 - 99 mg/dL 99  868  90   BUN 8 - 23 mg/dL 10  12  11    Creatinine 0.44 - 1.00 mg/dL 9.20  9.09  9.21   Sodium 135 - 145 mmol/L 136  136  137   Potassium 3.5 - 5.1 mmol/L 3.9  3.7  3.8   Chloride 98 - 111 mmol/L 104  104  105   CO2 22 - 32 mmol/L 25  24  21    Calcium  8.9 - 10.3 mg/dL 9.5  9.2  8.7   Total Protein 6.5 - 8.1 g/dL 6.5  6.6  5.9   Total Bilirubin 0.0 - 1.2 mg/dL 0.5  0.6  0.8   Alkaline Phos 38 - 126 U/L 98  93  82   AST 15 - 41 U/L 85  47  47   ALT 0 - 44 U/L 14  10  12      Lab Results  Component Value Date   WBC 1.6 (L) 09/08/2023   HGB 9.9 (L) 09/08/2023   HCT 28.2 (L) 09/08/2023   MCV 101.4 (H) 09/08/2023   PLT 161 09/08/2023   NEUTROABS 1.0 (L) 09/08/2023    ASSESSMENT & PLAN:  Malignant neoplasm of lower-outer quadrant of left breast of female, estrogen receptor positive (HCC) 10/26/17: Left mastectomy: IDC grade 1, 2 foci largest spans 8.5 cm, intermediate grade DCIS, lymphovascular invasion identified, perineural invasion identified, 1/2 lymph nodes positive with extracapsular extension, ER 9200%, PR 5 to 50%, HER-2 negative, Ki-67 10 to 15%, T3N1A   Oncotype DX score 22, intermediate risk, chemotherapy not felt to have significant benefit.   Treatment Summary: 1. Antiestrogen therapy with anastrozole  1 mg daily started 07/15/2016 2. Mastectomy 10/26/2017, Mammaprint low risk luminal type A 3. Followed by adjuvant radiation 12/08/17- 01/26/18  4. Followed by adjuvant antiestrogen therapy anastrozole  started 01/17/2018 (originally started 07/15/2016) 5.  August 2023: Low back pain: Large lesion in the sacrum biopsy of sacrum: 12/26/2021: Metastatic breast cancer, ER 90%, PR 10%, HER2 negative (0) 6. Ibrance  along  with Faslodex  started 12/25/2021-10/14/2022 7. Palliative radiation to the sacrum completed 01/19/2022 8.  Enhertu  started 10/21/2022-07/06/2023 9.  Skin biopsy: 06/14/2023: Poorly differentiated adenocarcinoma x 2 biopsies: ER 99%, PR 2%, HER2 1+, Ki-67 25%, Caris molecular testing: HER2 low, hormone receptor positive, ESR 1 mutation (no PIK3CA, no PALB2, no PD-L1, TMB 4) --------------------------------------------------------------------   Current treatment: Verzinio with Faslodex  started 07/16/2023 CT CAP 05/21/2023: Extensive bone metastases similar, low-attenuation liver lesion not clearly seen previously (liver MRI suggested)   Verzinio toxicities: Tolerating fairly well.  Intermittent loose stools but not causing concern. Fatigue Blood counts have been reviewed.  Although the WBC count is low but neutrophil count is stable.   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 Current medication poorly tolerated. Radiation therapy planned to reduce seizure frequency and medication dosage. - Proceed with planned radiation therapy for brain metastasis. - Continue current seizure medication until radiation therapy is completed. - Discuss potential reduction in seizure medication dosage post-radiation with the oncology team.  Swelling near eye possibly cancerous Swelling near the eye suspected to be cancerous, pending evaluation for radiation therapy. - Consult with Dr. Camelia regarding the need for radiation therapy for the  swelling near the eye. - Evaluate the swelling during the upcoming appointment with Dr. Camelia.  Leukopenia due to Verzenio  Leukopenia with WBC count of 1.6 and neutrophils at 1.0. Verzenio  dosage reduced to 50 mg twice daily. - Continue Verzenio  at 50 mg twice daily. - Monitor blood counts regularly.  Diarrhea due to Verzenio  Diarrhea managed at reduced Verzenio  dosage. - Continue Verzenio  at 50 mg twice daily.  Fatigue Fatigue likely multifactorial, related to cancer treatment and seizure medication. Radiation therapy may alleviate symptoms. - Continue current medication regimen. - Reassess fatigue levels post-radiation therapy and adjust treatment as necessary.  - Reevaluate seizure medication dosage post-radiation  therapy.      Orders Placed This Encounter  Procedures   CBC with Differential (Cancer Center Only)    Standing Status:   Future    Expiration Date:   09/07/2024   CMP (Cancer Center only)    Standing Status:   Future    Expiration Date:   09/07/2024   The patient has a good understanding of the overall plan. she agrees with it. she will call with any problems that may develop before the next visit here. Total time spent: 30 mins including face to face time and time spent for planning, charting and co-ordination of care   Viinay K Jaquay Posthumus, MD 09/08/23

## 2023-09-09 ENCOUNTER — Telehealth: Payer: Self-pay

## 2023-09-09 ENCOUNTER — Ambulatory Visit
Admission: RE | Admit: 2023-09-09 | Discharge: 2023-09-09 | Disposition: A | Discharge status: Home / Self Care | Source: Ambulatory Visit | Attending: Radiation Oncology | Admitting: Radiation Oncology

## 2023-09-09 ENCOUNTER — Ambulatory Visit

## 2023-09-09 ENCOUNTER — Encounter: Payer: Self-pay | Admitting: Hematology and Oncology

## 2023-09-09 ENCOUNTER — Ambulatory Visit: Admitting: Radiation Oncology

## 2023-09-09 NOTE — Telephone Encounter (Signed)
 Received VM from pt's sister, Gwenlyn stating she wants to talk to Dr Odean personally about Kathryn Lucas. Attempted to call [facility]. LVM for call back.

## 2023-09-13 ENCOUNTER — Telehealth: Payer: Self-pay

## 2023-09-13 ENCOUNTER — Ambulatory Visit
Admission: RE | Admit: 2023-09-13 | Discharge: 2023-09-13 | Disposition: A | Discharge status: Home / Self Care | Source: Ambulatory Visit | Attending: Radiation Oncology | Admitting: Radiation Oncology

## 2023-09-13 ENCOUNTER — Encounter: Payer: Self-pay | Admitting: Radiation Oncology

## 2023-09-13 VITALS — BP 97/35 | HR 127 | Temp 97.0°F | Ht 70.0 in | Wt 147.0 lb

## 2023-09-13 DIAGNOSIS — C50512 Malignant neoplasm of lower-outer quadrant of left female breast: Secondary | ICD-10-CM | POA: Insufficient documentation

## 2023-09-13 DIAGNOSIS — C7931 Secondary malignant neoplasm of brain: Secondary | ICD-10-CM | POA: Diagnosis not present

## 2023-09-13 DIAGNOSIS — C7951 Secondary malignant neoplasm of bone: Secondary | ICD-10-CM | POA: Insufficient documentation

## 2023-09-13 NOTE — Progress Notes (Signed)
 Radiation Oncology Follow up Note old patient new area calvarium and skin lesions from stage IV metastatic breast cancer  Name: Kathryn Lucas   Date:   09/13/2023 MRN:  [id number] DOB: February 24, 1961    This 63 y.o. female presents to the clinic today for reevaluation of progressive metastatic disease patient with known stage IV breast cancer to both her skin and calvarium and patient previously treated to her cervical spine for palliation.  REFERRING PROVIDER: Vernon Velna SAUNDERS, MD  HPI: Patient is a 63 year old female now out close to year have received radiation therapy to her cervical spine for metastatic breast cancer..  She is recently presented with sudden onset of speech arrest as well as seizure activity for which she has been put on Keppra  500 mg twice a day.  CT scan showed extensive predominantly stable areas of osseous metastatic disease throughout the calvarium.  There is also a new area of enhancing soft tissue attenuation along the lateral aspect of the right globe.  Patient also has 2 large areas of metastatic disease involving for her upper back.  Recent CT angio of the head and neck showed no evidence of large vessel occlusion.  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  She has been seen by Dr. Buckley as well as medical oncology and is now referred for consideration of palliative treatment.  She also has other skeletal metastatic disease as well as pathologic fracture of C3 vertebral body which was treated last year and is stable.  She is having no significant problems with vision or other cranial nerves.  COMPLICATIONS OF TREATMENT: none  FOLLOW UP COMPLIANCE: keeps appointments   PHYSICAL EXAM:  BP (!) 97/35   Pulse (!) 127   Temp (!) 97 F (36.1 C) (Tympanic)   Ht 5' 10 (1.778 m)   Wt 147 lb (66.7 kg)   BMI 21.09 kg/m   Crude visual fields within normal range motor sensory and Diette levels are equal symmetric in upper lower extremities.  Well-developed well-nourished patient in NAD. HEENT reveals PERLA, EOMI, discs not visualized.  Oral cavity is clear. No oral mucosal lesions are identified. Neck is clear without evidence of cervical or supraclavicular adenopathy. Lungs are clear to A&P. Cardiac examination is essentially unremarkable with regular rate and rhythm without murmur rub or thrill. Abdomen is benign with no organomegaly or masses noted. Motor sensory and DTR levels are equal and symmetric in the upper and lower extremities. Cranial nerves II through XII are grossly intact. Proprioception is intact. No peripheral adenopathy or edema is identified. No motor or sensory levels are noted. Crude visual fields are within normal range.  RADIOLOGY RESULTS: CT scans of the head and neck reviewed PET CT scan ordered  PLAN: At this time patient's not had a PET scan since August 2024.  To better discern the areas of tumor involvement in both her calvarium as well as right orbit I have ordered a PET CT scan for treatment planning purposes.  I have also set her up for treatment planning the end of next week and we will try to treat areas of calvarium involvement 30 Gray in 10 fractions.  Should her orbital lesion also be hypermetabolic may offer to treat with SBRT treatment to that area.  Also will consider short course of palliative radiation therapy to her metastatic skin lesions on her back after we have completed her brain  and possible orbital metastasis.  Risks and benefits of treatment including hair loss fatigue alteration of blood counts skin reaction all were reviewed with the patient and her sister.  They both comprehend the treatment plan well.  I have set her up for simulation and later part of next week hopefully we can have a stat PET scan performed in the next several days.  I would like to take this  opportunity to thank you for allowing me to participate in the care of your patient.SABRA Marcey Penton, MD

## 2023-09-13 NOTE — Telephone Encounter (Signed)
 Received a call once again from pt's sister, Gwenlyn requesting a call back to discuss most recent MD visit. Attempted to call pt and call went straight to VM. LVM for call back.

## 2023-09-15 ENCOUNTER — Other Ambulatory Visit: Payer: Self-pay | Admitting: Pharmacy Technician

## 2023-09-15 ENCOUNTER — Other Ambulatory Visit: Payer: Self-pay

## 2023-09-15 ENCOUNTER — Ambulatory Visit
Admission: RE | Admit: 2023-09-15 | Discharge: 2023-09-15 | Disposition: A | Discharge status: Home / Self Care | Source: Ambulatory Visit | Attending: Radiation Oncology | Admitting: Radiation Oncology

## 2023-09-15 DIAGNOSIS — C7951 Secondary malignant neoplasm of bone: Secondary | ICD-10-CM | POA: Insufficient documentation

## 2023-09-15 DIAGNOSIS — R590 Localized enlarged lymph nodes: Secondary | ICD-10-CM | POA: Insufficient documentation

## 2023-09-15 DIAGNOSIS — C787 Secondary malignant neoplasm of liver and intrahepatic bile duct: Secondary | ICD-10-CM | POA: Diagnosis not present

## 2023-09-15 LAB — GLUCOSE, CAPILLARY: Glucose-Capillary: 97 mg/dL (ref 70–99)

## 2023-09-15 MED ORDER — HEPARIN SOD (PORK) LOCK FLUSH 100 UNIT/ML IV SOLN
INTRAVENOUS | Status: AC
  Filled 2023-09-15: qty 5

## 2023-09-15 MED ORDER — HEPARIN SOD (PORK) LOCK FLUSH 100 UNIT/ML IV SOLN
500.0000 [IU] | Freq: Once | INTRAVENOUS | Status: AC
  Administered 2023-09-15: 500 [IU] via INTRAVENOUS
  Filled 2023-09-15: qty 5

## 2023-09-15 MED ORDER — FLUDEOXYGLUCOSE F - 18 (FDG) INJECTION
7.9900 | Freq: Once | INTRAVENOUS | Status: AC | PRN
Start: 2023-09-15 — End: 2023-09-15
  Administered 2023-09-15: 7.99 via INTRAVENOUS

## 2023-09-15 NOTE — Progress Notes (Signed)
 Specialty Pharmacy Refill Coordination Note  Kathryn Lucas is a 63 y.o. female contacted today regarding refills of specialty medication(s) Abemaciclib  (VERZENIO )   Patient requested Delivery   Delivery date: 09/21/23   Verified address: [address]  [place]   Medication will be filled on 09/20/23.

## 2023-09-20 ENCOUNTER — Telehealth: Payer: Self-pay | Admitting: Internal Medicine

## 2023-09-20 NOTE — Telephone Encounter (Signed)
 Scheduled appointment per 6/19 los. Called and left a VM with the appointment details for the patient.

## 2023-09-21 ENCOUNTER — Other Ambulatory Visit: Payer: Self-pay

## 2023-09-22 ENCOUNTER — Ambulatory Visit: Admitting: Pharmacist

## 2023-09-22 ENCOUNTER — Other Ambulatory Visit

## 2023-09-22 ENCOUNTER — Ambulatory Visit
Admission: RE | Admit: 2023-09-22 | Discharge: 2023-09-22 | Disposition: A | Discharge status: Home / Self Care | Source: Ambulatory Visit | Attending: Radiation Oncology | Admitting: Radiation Oncology

## 2023-09-22 DIAGNOSIS — C7951 Secondary malignant neoplasm of bone: Secondary | ICD-10-CM | POA: Insufficient documentation

## 2023-09-22 DIAGNOSIS — Z510 Encounter for antineoplastic radiation therapy: Secondary | ICD-10-CM | POA: Diagnosis not present

## 2023-09-22 DIAGNOSIS — C7931 Secondary malignant neoplasm of brain: Secondary | ICD-10-CM | POA: Diagnosis not present

## 2023-09-22 DIAGNOSIS — C50512 Malignant neoplasm of lower-outer quadrant of left female breast: Secondary | ICD-10-CM | POA: Insufficient documentation

## 2023-09-22 NOTE — Assessment & Plan Note (Signed)
 10/26/17: Left mastectomy: IDC grade 1, 2 foci largest spans 8.5 cm, intermediate grade DCIS, lymphovascular invasion identified, perineural invasion identified, 1/2 lymph nodes positive with extracapsular extension, ER 9200%, PR 5 to 50%, HER-2 negative, Ki-67 10 to 15%, T3N1A   Oncotype DX score 22, intermediate risk, chemotherapy not felt to have significant benefit.   Treatment Summary: 1. Antiestrogen therapy with anastrozole  1 mg daily started 07/15/2016 2. Mastectomy 10/26/2017, Mammaprint low risk luminal type A 3. Followed by adjuvant radiation 12/08/17- 01/26/18  4. Followed by adjuvant antiestrogen therapy anastrozole  started 01/17/2018 (originally started 07/15/2016) 5.  August 2023: Low back pain: Large lesion in the sacrum biopsy of sacrum: 12/26/2021: Metastatic breast cancer, ER 90%, PR 10%, HER2 negative (0) 6. Ibrance  along with Faslodex  started 12/25/2021-10/14/2022 7. Palliative radiation to the sacrum completed 01/19/2022 8.  Enhertu  started 10/21/2022-07/06/2023 9.  Skin biopsy: 06/14/2023: Poorly differentiated adenocarcinoma x 2 biopsies: ER 99%, PR 2%, HER2 1+, Ki-67 25%, Caris molecular testing: HER2 low, hormone receptor positive, ESR 1 mutation (no PIK3CA, no PALB2, no PD-L1, TMB 4) --------------------------------------------------------------------   Current treatment: Verzinio with Faslodex  started 07/16/2023 CT CAP 05/21/2023: Extensive bone metastases similar, low-attenuation liver lesion not clearly seen previously (liver MRI suggested)   Verzinio toxicities: Tolerating fairly well.  Intermittent loose stools but not causing concern. Fatigue Blood counts have been reviewed.  Although the WBC count is low but neutrophil count is stable.  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

## 2023-09-23 ENCOUNTER — Other Ambulatory Visit (HOSPITAL_COMMUNITY): Payer: Self-pay

## 2023-09-23 ENCOUNTER — Telehealth: Payer: Self-pay

## 2023-09-23 ENCOUNTER — Encounter: Payer: Self-pay | Admitting: Hematology and Oncology

## 2023-09-23 ENCOUNTER — Encounter: Payer: Self-pay | Admitting: Internal Medicine

## 2023-09-23 ENCOUNTER — Inpatient Hospital Stay: Admitting: Internal Medicine

## 2023-09-23 ENCOUNTER — Other Ambulatory Visit: Payer: Self-pay | Admitting: *Deleted

## 2023-09-23 ENCOUNTER — Inpatient Hospital Stay: Attending: Hematology and Oncology | Admitting: Hematology and Oncology

## 2023-09-23 DIAGNOSIS — Z510 Encounter for antineoplastic radiation therapy: Secondary | ICD-10-CM | POA: Diagnosis not present

## 2023-09-23 DIAGNOSIS — Z170 Estrogen receptor positive status [ER+]: Secondary | ICD-10-CM

## 2023-09-23 DIAGNOSIS — C7951 Secondary malignant neoplasm of bone: Secondary | ICD-10-CM

## 2023-09-23 DIAGNOSIS — C50512 Malignant neoplasm of lower-outer quadrant of left female breast: Secondary | ICD-10-CM

## 2023-09-23 DIAGNOSIS — R569 Unspecified convulsions: Secondary | ICD-10-CM

## 2023-09-23 MED ORDER — CAPECITABINE 500 MG PO TABS
1500.0000 mg | ORAL_TABLET | Freq: Two times a day (BID) | ORAL | 6 refills | Status: DC
Start: 2023-09-23 — End: 2023-10-22
  Filled 2023-09-24: qty 84, 21d supply, fill #0
  Filled 2023-10-11: qty 84, 21d supply, fill #1

## 2023-09-23 MED ORDER — LACOSAMIDE 100 MG PO TABS: 100.0000 mg | ORAL_TABLET | Freq: Two times a day (BID) | ORAL | 2 refills | Status: DC

## 2023-09-23 NOTE — Telephone Encounter (Signed)
 Oral Oncology Patient Advocate Encounter  After completing a benefits investigation, prior authorization for Capecitabine  is not required at this time through [facility].  Patient's copay is $0.00.      Charlott Hamilton,  CPhT-Adv  she/her/hers [facility]  [facility] Pharmacy Technician Patient Advocate Specialist III [facility] Phone: [phone]  Fax: [phone] [email or web address]

## 2023-09-23 NOTE — Progress Notes (Signed)
 HEMATOLOGY-ONCOLOGY TELEPHONE VISIT PROGRESS NOTE  I connected with our patient on 09/23/23 at  1:30 PM EDT by telephone and verified that I am speaking with the correct person using two identifiers.  I discussed the limitations, risks, security and privacy concerns of performing an evaluation and management service by telephone and the availability of in person appointments.  I also discussed with the patient that there may be a patient responsible charge related to this service. The patient expressed understanding and agreed to proceed.   History of Present Illness:  F/U to discuss PET scan  History of Present Illness Kathryn Lucas is a 63 year old female with metastatic cancer who presents with disease progression.  Recent imaging shows disease progression with increased lymph nodes under the right arm, in the middle of the chest, and a new left-sided neck lymph node. Multiple new liver lesions are present, including a 1.6 cm lesion in the right lobe. Previous scans in March showed a shadow in the liver, now more visible.  She has been on Verzenio  and Faslodex , which are no longer effective. The cancer has been aggressive, particularly with liver involvement, for almost two years.  She experiences fatigue, particularly in the afternoon, which she attributes to her seizure medication. There is concern about a small cancer spot on her eyelid and a tumor on her skull.    Oncology History  Malignant neoplasm of lower-outer quadrant of left breast of female, estrogen receptor positive (HCC)  06/11/2016 Mammogram   Palpable left breast masses 3:00 position: 2.2 cm; 5:30 position: 2.5 cm; 6:30 position: 0.7 cm   06/19/2016 Initial Diagnosis   Left breast biopsy 3:30: IDC with DCIS grade 1, ER 90%, PR 50%, Ki-67 15%, HER-2 negative ratio 1.13; biopsy 5:30 position: IDC grade 1   07/13/2016 Breast MRI   Large area of abnormal enhancement lower inner and lower outer quadrants left breast spanning 9  cm x 6.4 cm x 5.3 cm, no abnormal enlarged lymph nodes; T3 N0 stage II a (New AJCC staging)    07/15/2016 - 12/08/2017 Anti-estrogen oral therapy   Neoadjuvant anastrozole  1 mg daily   07/17/2016 Oncotype testing   Testing done on the biopsy: Oncotype DX score 22, intermediate risk   02/02/2017 Breast MRI   Left breast multicentric disease unchanged measuring 2.7 x 1.6 cm.  Mass in the non-mass enhancement are also not significantly changed measuring 6.2 x 2.4 cm. new enhancing mass within the outer right breast 7 mm which could be fat necrosis or inclusion cyst    02/09/2017 Imaging   Ultrasound of the right breast lesion noted on MRI: No sonographic finding corresponds to the abnormality noted on MRI   07/13/2017 Cancer Staging   Staging form: Breast, AJCC 8th Edition - Clinical stage from 07/13/2017: Stage IIA (cT3, cN0, cM0, G1, ER+, PR+, HER2-) - Signed by Crawford Morna Pickle, NP on 05/18/2018   10/26/2017 Surgery   Left mastectomy: IDC grade 1, 2 foci largest spans 8.5 cm, intermediate grade DCIS, lymphovascular invasion identified, perineural invasion identified, 1/2 lymph nodes positive with extracapsular extension, ER 9200%, PR 5 to 50%, HER-2 negative, Ki-67 10 to 15%, T3N1A Mammaprint: low risk   11/02/2017 Cancer Staging   Staging form: Breast, AJCC 8th Edition - Pathologic: No Stage Recommended (ypT3, pN1a, cM0, G1, ER+, PR+, HER2-) - Signed by Odean Potts, MD on 11/02/2017   12/08/2017 - 01/26/2018 Radiation Therapy   Adjuvant radiation therapy    02/2018 -  Anti-estrogen oral therapy  Anastrozole  1 mg daily adjuvant therapy   10/21/2022 - 07/06/2023 Chemotherapy   Patient is on Treatment Plan : BREAST METASTATIC Fam-Trastuzumab Deruxtecan-nxki  (Enhertu ) (5.4) q21d       REVIEW OF SYSTEMS:   Constitutional: Denies fevers, chills or abnormal weight loss All other systems were reviewed with the patient and are negative. Observations/Objective:     Assessment Plan:   Malignant neoplasm of lower-outer quadrant of left breast of female, estrogen receptor positive (HCC) 10/26/17: Left mastectomy: IDC grade 1, 2 foci largest spans 8.5 cm, intermediate grade DCIS, lymphovascular invasion identified, perineural invasion identified, 1/2 lymph nodes positive with extracapsular extension, ER 9200%, PR 5 to 50%, HER-2 negative, Ki-67 10 to 15%, T3N1A   Oncotype DX score 22, intermediate risk, chemotherapy not felt to have significant benefit.   Treatment Summary: 1. Antiestrogen therapy with anastrozole  1 mg daily started 07/15/2016 2. Mastectomy 10/26/2017, Mammaprint low risk luminal type A 3. Followed by adjuvant radiation 12/08/17- 01/26/18  4. Followed by adjuvant antiestrogen therapy anastrozole  started 01/17/2018 (originally started 07/15/2016) 5.  August 2023: Low back pain: Large lesion in the sacrum biopsy of sacrum: 12/26/2021: Metastatic breast cancer, ER 90%, PR 10%, HER2 negative (0) 6. Ibrance  along with Faslodex  started 12/25/2021-10/14/2022 7. Palliative radiation to the sacrum completed 01/19/2022 8.  Enhertu  started 10/21/2022-07/06/2023 9.  Skin biopsy: 06/14/2023: Poorly differentiated adenocarcinoma x 2 biopsies: ER 99%, PR 2%, HER2 1+, Ki-67 25%, Caris molecular testing: HER2 low, hormone receptor positive, ESR 1 mutation (no PIK3CA, no PALB2, no PD-L1, TMB 4) 10. 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 The patient was provided an opportunity to ask questions and all were answered. The patient agreed with the plan and demonstrated an understanding of the instructions. The patient was advised to call back or seek an in-person evaluation if the symptoms worsen or if the condition fails to improve as anticipated.   I provided 20 minutes of non-face-to-face time during this encounter.  This includes time for charting and coordination of care   Naomi MARLA Chad, MD

## 2023-09-23 NOTE — Progress Notes (Signed)
 I connected with Kathryn Lucas on 09/23/23 at  3:30 PM EDT by telephone visit and verified that I am speaking with the correct person using two identifiers.  I discussed the limitations, risks, security and privacy concerns of performing an evaluation and management service by telemedicine and the availability of in-person appointments. I also discussed with the patient that there may be a patient responsible charge related to this service. The patient expressed understanding and agreed to proceed.  Other persons participating in the visit and their role in the encounter:  n/a  Patient's location:  Home Provider's location:  Office Chief Complaint:  Focal seizure (HCC)  History of Present Ilness: Kathryn Lucas reports no clinical changes today.  No further seizures.  She continues to dose the Keppra  750mg  twice per day, but complains of mood swings, irritability, and agitation after dosing.  She has radiation treatments to the skull planned to start next week with Dr. Lenn in [place].      Observations: Language and cognition at baseline.    Assessment and Plan: Focal seizure (HCC)  Clinically stable and seizure free, but she is having adverse effects from the Keppra .  Discussed and recommended trial of Vimpat  100mg  BID as a replacement, she is agreeable to try this.    Skull radiation treatments will begin shortly otherwise.   Follow Up Instructions:  We ask that Kathryn Lucas return to clinic in 1 month for seizure medication adjustment, and in 3 months with repeat CT head, or sooner if needed.  I discussed the assessment and treatment plan with the patient.  The patient was provided an opportunity to ask questions and all were answered.  The patient agreed with the plan and demonstrated understanding of the instructions.    The patient was advised to call back or seek an in-person evaluation if the symptoms worsen or if the condition fails to improve as anticipated.    Srihith Aquilino K  Rolen Conger, MD   I provided 20 minutes of non face-to-face telephone visit time during this encounter, and > 50% was spent counseling as documented under my assessment & plan.

## 2023-09-24 ENCOUNTER — Other Ambulatory Visit (HOSPITAL_COMMUNITY): Payer: Self-pay

## 2023-09-24 ENCOUNTER — Other Ambulatory Visit: Payer: Self-pay | Admitting: Pharmacist

## 2023-09-24 ENCOUNTER — Telehealth: Payer: Self-pay | Admitting: *Deleted

## 2023-09-24 ENCOUNTER — Telehealth: Payer: Self-pay | Admitting: Pharmacist

## 2023-09-24 ENCOUNTER — Encounter: Payer: Self-pay | Admitting: Pharmacist

## 2023-09-24 ENCOUNTER — Encounter: Payer: Self-pay | Admitting: Internal Medicine

## 2023-09-24 ENCOUNTER — Other Ambulatory Visit: Payer: Self-pay

## 2023-09-24 DIAGNOSIS — C7951 Secondary malignant neoplasm of bone: Secondary | ICD-10-CM

## 2023-09-24 DIAGNOSIS — F419 Anxiety disorder, unspecified: Secondary | ICD-10-CM

## 2023-09-24 DIAGNOSIS — G4700 Insomnia, unspecified: Secondary | ICD-10-CM

## 2023-09-24 DIAGNOSIS — Z515 Encounter for palliative care: Secondary | ICD-10-CM

## 2023-09-24 DIAGNOSIS — R110 Nausea: Secondary | ICD-10-CM

## 2023-09-24 NOTE — Progress Notes (Signed)
 Patient counseled in clinic telephone visit note on 09/24/23

## 2023-09-24 NOTE — Progress Notes (Signed)
 Specialty Pharmacy Initial Fill Coordination Note  Kathryn Lucas is a 63 y.o. female contacted today regarding refills of specialty medication(s) Capecitabine  (XELODA ) .  Patient requested Delivery  on 09/28/23  to verified address [address]  [place] [address]   Medication will be filled on 09/27/23.   Patient is aware of $0.00 copayment.

## 2023-09-24 NOTE — Telephone Encounter (Signed)
 [facility]       Telephone: [phone]?Fax: [phone]   Oncology Clinical Pharmacist Practitioner Initial Assessment  Kathryn Lucas is a 63 y.o. female with a diagnosis of breast cancer. They were contacted today via telephone visit.  I connected with Kathryn Lucas today by telephone and verified that I was speaking with the correct person using two patient identifiers. I discussed the limitations, risks, security and privacy concerns of performing an evaluation and management service by telemedicine and the availability of in-person appointments. The patient/caregiver expressed understanding and agreed to proceed.  Other persons participating in the visit and their role in the encounter: none   Patient's location: home  Provider's location: clinic   Indication/Regimen Capecitabine  (Xeloda ) is being used appropriately for treatment of metastatic breast cancer by Dr. Vinay Gudena.      Wt Readings from Last 1 Encounters:  09/13/23 147 lb (66.7 kg)    Estimated body surface area is 1.82 meters squared as calculated from the following:   Height as of 09/13/23: 5' 10 (1.778 m).   Weight as of 09/13/23: 147 lb (66.7 kg).  The dosing regimen is 3 tablets (1500 mg) by mouth in the morning and 3 tablets (1500 mg) in the evening on days 1 to 14 of a 21-day cycle. . This is being given  in combination with denosumab  120 mg (Xgeva ). It is planned to continue until disease progression or unacceptable toxicity. Prescription dose and frequency assessed for appropriateness.  Patient has agreed to treatment which is documented in physician note on 09/23/23. Counseled patient on administration, dosing, side effects, monitoring, drug-food interactions, safe handling, storage, and disposal.  Capecitabine  should be delivered on 09/28/23 and patient can start next day. She has stopped abemaciclib  and fulvestrant  has been removed from her supportive care plan. Denosumab  was last  given on 09/08/23 and is next due on 12/01/23.  Dose Modifications Capecitabine  dose per Dr. Gudena is ~824 mg/m2 BID 14 out 21 days.   Access Assessment Kathryn Lucas will be receiving capecitabine  through [facility] Concerns: none Start date if known: 09/29/23  Adherence Assessment Reviewed importance on keeping a med schedule and plan for any missed doses Barriers to adherence identified? No  Allergies Allergies  Allergen Reactions   Percocet [Oxycodone -Acetaminophen ] Nausea And Vomiting    Severe GI upset and vomiting    Vitals    09/13/2023    1:55 PM 09/08/2023    1:23 PM 09/02/2023    3:00 PM  Oncology Vitals  Height 178 cm    Weight 66.679 kg 66.769 kg   Weight (lbs) 147 lbs 147 lbs 3 oz   BMI 21.09 kg/m2 21.12 kg/m2   Temp 97 F (36.1 C) 99.2 F (37.3 C)   Pulse Rate 127 98 112  BP 97/35 113/87   Resp  17   SpO2  100 %   BSA (m2) 1.82 m2 1.82 m2      Laboratory Data    Latest Ref Rng & Units 09/08/2023   12:48 PM 08/25/2023    1:59 PM 08/20/2023   10:36 AM  CBC EXTENDED  WBC 4.0 - 10.5 K/uL 1.6  2.0  1.1   RBC 3.87 - 5.11 MIL/uL 2.78  3.15  2.79   Hemoglobin 12.0 - 15.0 g/dL 9.9  88.8  9.6   HCT 63.9 - 46.0 % 28.2  32.0  28.9   Platelets 150 - 400 K/uL 161  160  125  NEUT# 1.7 - 7.7 K/uL 1.0  1.3  0.6   Lymph# 0.7 - 4.0 K/uL 0.3  0.4  0.3        Latest Ref Rng & Units 09/08/2023   12:48 PM 08/25/2023    1:59 PM 08/20/2023   10:36 AM  CMP  Glucose 70 - 99 mg/dL 99  868  90   BUN 8 - 23 mg/dL 10  12  11    Creatinine 0.44 - 1.00 mg/dL 9.20  9.09  9.21   Sodium 135 - 145 mmol/L 136  136  137   Potassium 3.5 - 5.1 mmol/L 3.9  3.7  3.8   Chloride 98 - 111 mmol/L 104  104  105   CO2 22 - 32 mmol/L 25  24  21    Calcium  8.9 - 10.3 mg/dL 9.5  9.2  8.7   Total Protein 6.5 - 8.1 g/dL 6.5  6.6  5.9   Total Bilirubin 0.0 - 1.2 mg/dL 0.5  0.6  0.8   Alkaline Phos 38 - 126 U/L 98  93  82   AST 15 - 41 U/L 85  47  47   ALT 0 - 44  U/L 14  10  12     Contraindications Contraindications were reviewed? Yes Contraindications to therapy were identified? No   Safety Precautions The following safety precautions for the use of capecitabine  were reviewed:  Fever: reviewed the importance of having a thermometer and the Centers for Disease Control and Prevention (CDC) definition of fever which is 100.90F (38C) or higher. Patient should call 24/7 triage at [phone] if experiencing a fever or any other symptoms Decreased white blood cells (WBCs) and increased risk for infection Decreased hemoglobin, part of the red blood cells that carry iron and oxygen Diarrhea Mouth irritation or sores Pain or discomfort in hands and/or feet Changes in liver function Nausea or vomiting Fatigue Abdominal pain Decreased platelet count and increased risk of bleeding Interactions with warfarin Cardiotoxicity Kidney injury Dehydration Alopecia Handling body fluids and waste Pregnancy, sexual activity, and contraception Storage and Handling To be taken within 30 minutes of a meal Missed doses  Medication Reconciliation Current Outpatient Medications  Medication Sig Dispense Refill   Calcium  500-100 MG-UNIT CHEW Chew 1 tablet by mouth daily. 60 tablet    capecitabine  (XELODA ) 500 MG tablet Take 3 tablets (1,500 mg total) by mouth 2 (two) times daily after a meal. 14 days on and 7 days off (Patient not taking: Reported on 09/24/2023) 84 tablet 6   cholecalciferol (VITAMIN D3) 25 MCG (1000 UNIT) tablet Take 1 tablet (1,000 Units total) by mouth daily.     furosemide  (LASIX ) 20 MG tablet Take 1 tablet (20 mg total) by mouth daily. If needed may take 1/2 tab q am 30 tablet 0   Lacosamide  (VIMPAT ) 100 MG TABS Take 1 tablet (100 mg total) by mouth in the morning and at bedtime. (Patient not taking: Reported on 09/24/2023) 60 tablet 2   levETIRAcetam  (KEPPRA ) 750 MG tablet Take 1 tablet (750 mg total) by mouth 2 (two) times daily. 60 tablet 2    lidocaine -prilocaine  (EMLA ) cream Apply 1 Application topically as needed. Apply one hour prior to port a cath access 30 g 1   LORazepam  (ATIVAN ) 0.5 MG tablet Take 1 tablet (0.5 mg total) by mouth at bedtime. (Patient taking differently: Take 0.5 mg by mouth at bedtime as needed.) 30 tablet 0   omeprazole  (PRILOSEC) 20 MG capsule TAKE 1 CAPSULE BY MOUTH  ONCE DAILY 30 capsule 3   SYNTHROID  125 MCG tablet TAKE 1 TABLET BY MOUTH ONCE DAILY BEFOREBREAKFAST 90 tablet 0   vitamin C  (ASCORBIC ACID) 250 MG tablet Take 1 tablet (250 mg total) by mouth daily.     Zinc  Acetate, Oral, (ZINC  ACETATE PO) Take by mouth.     No current facility-administered medications for this visit.   Medication reconciliation is based on the patient's most recent medication list in the electronic medical record (EMR) including herbal products and OTC medications.   The patient's medication list was reviewed today with the patient? Yes   Drug-drug interactions (DDIs) DDIs were evaluated? Yes Significant DDIs identified? No   Drug-Food Interactions Drug-food interactions were evaluated? Yes Drug-food interactions identified? No   Follow-up Plan  Patient education handout given to patient Start capecitabine  3 tablets (1500 mg) every 12 hours for 14 days, followed by a 7 day rest period (21 day cycle). Take within 30 minutes of a meal. Continue denosumab  120 mg every 12 weeks. Last given on 09/08/23, next due on 12/01/23 Monitor for side effects.  Distress thermometer not completed during telephone call as patient has been on previous lines of therapy.  Port flush labs and Dr. Gudena ordered for two weeks yesterday by Dr. Odean. Will notify scheduling as not scheduled yet. Patient prefers port labs Kathryn Lucas can follow up with clinical pharmacy as deemed necessary by Dr. Mackey Odean going forward   Kathryn Lucas participated in the discussion, expressed understanding, and voiced agreement with the above plan.  All questions were answered to her satisfaction. The patient was advised to contact the clinic at [phone] with any questions or concerns prior to her return visit.   I spent 30 minutes assessing the patient.  Christ Fullenwider A. Lucila, PharmD, BCOP, CPP  Norleen DELENA Lucila, RPH-CPP, 09/24/2023 10:22 AM  **Disclaimer: This note was dictated with voice recognition software. Similar sounding words can inadvertently be transcribed and this note may contain transcription errors which may not have been corrected upon publication of note.**

## 2023-09-24 NOTE — Telephone Encounter (Signed)
 Confirmed with patient that she is referring to Vimpat . Will start taking this morning and then again tonight

## 2023-09-27 ENCOUNTER — Other Ambulatory Visit: Payer: Self-pay

## 2023-09-27 ENCOUNTER — Encounter: Payer: Self-pay | Admitting: Hematology and Oncology

## 2023-09-28 ENCOUNTER — Inpatient Hospital Stay (HOSPITAL_BASED_OUTPATIENT_CLINIC_OR_DEPARTMENT_OTHER): Admitting: Internal Medicine

## 2023-09-28 DIAGNOSIS — R569 Unspecified convulsions: Secondary | ICD-10-CM

## 2023-09-28 MED ORDER — ZONISAMIDE 100 MG PO CAPS: 100.0000 mg | ORAL_CAPSULE | Freq: Every day | ORAL | 1 refills | Status: DC

## 2023-09-28 NOTE — Progress Notes (Signed)
 I connected with Kathryn Lucas on 09/28/23 at 12:00 PM EDT by telephone visit and verified that I am speaking with the correct person using two identifiers.  I discussed the limitations, risks, security and privacy concerns of performing an evaluation and management service by telemedicine and the availability of in-person appointments. I also discussed with the patient that there may be a patient responsible charge related to this service. The patient expressed understanding and agreed to proceed.  Other persons participating in the visit and their role in the encounter:  n/a  Patient's location:  Home Provider's location:  Office Chief Complaint:  Focal seizure (HCC)  History of Present Ilness: Kathryn Lucas reports poor tolerance of Vimpat .  She has been experiencing nausea and vomiting following each dose thus far (started 5 days ago).  No further seizures   Mood swings, irritability, and agitation do seem improved following cessation of Keppra .  She has radiation treatments to the skull planned to start next week with Dr. Lenn in [place].      Observations: Language and cognition at baseline.    Assessment and Plan: Focal seizure (HCC)  Clinically stable and seizure free, but she is having adverse effects from the Vimpat .  Discussed and recommended trial of Zonisamide  100mg  daily as a replacement, she is agreeable to try this.    Skull radiation treatments will begin shortly otherwise.   Follow Up Instructions:  We ask that KYRSTIN Lucas return to clinic in 1 month for seizure medication adjustment, and in 3 months with repeat CT head, or sooner if needed.  I discussed the assessment and treatment plan with the patient.  The patient was provided an opportunity to ask questions and all were answered.  The patient agreed with the plan and demonstrated understanding of the instructions.    The patient was advised to call back or seek an in-person evaluation if the symptoms worsen or  if the condition fails to improve as anticipated.    Kathryn Lucas K [name], MD   I provided 20 minutes of non face-to-face telephone visit time during this encounter, and > 50% was spent counseling as documented under my assessment & plan.

## 2023-09-30 ENCOUNTER — Ambulatory Visit
Admission: RE | Admit: 2023-09-30 | Discharge: 2023-09-30 | Disposition: A | Discharge status: Home / Self Care | Source: Ambulatory Visit | Attending: Radiation Oncology | Admitting: Radiation Oncology

## 2023-10-01 ENCOUNTER — Encounter: Payer: Self-pay | Admitting: Adult Health

## 2023-10-01 ENCOUNTER — Encounter: Payer: Self-pay | Admitting: Hematology and Oncology

## 2023-10-04 ENCOUNTER — Other Ambulatory Visit: Payer: Self-pay

## 2023-10-04 ENCOUNTER — Ambulatory Visit
Admission: RE | Admit: 2023-10-04 | Discharge: 2023-10-04 | Disposition: A | Discharge status: Home / Self Care | Source: Ambulatory Visit | Attending: Radiation Oncology | Admitting: Radiation Oncology

## 2023-10-04 DIAGNOSIS — Z510 Encounter for antineoplastic radiation therapy: Secondary | ICD-10-CM | POA: Diagnosis not present

## 2023-10-04 LAB — RAD ONC ARIA SESSION SUMMARY
Course Elapsed Days: 0
Plan Fractions Treated to Date: 1
Plan Prescribed Dose Per Fraction: 3 Gy
Plan Total Fractions Prescribed: 10
Plan Total Prescribed Dose: 30 Gy
Reference Point Dosage Given to Date: 3 Gy
Reference Point Session Dosage Given: 3 Gy
Session Number: 1

## 2023-10-05 ENCOUNTER — Ambulatory Visit
Admission: RE | Admit: 2023-10-05 | Discharge: 2023-10-05 | Disposition: A | Discharge status: Home / Self Care | Source: Ambulatory Visit | Attending: Radiation Oncology | Admitting: Radiation Oncology

## 2023-10-05 ENCOUNTER — Other Ambulatory Visit: Payer: Self-pay

## 2023-10-05 DIAGNOSIS — Z510 Encounter for antineoplastic radiation therapy: Secondary | ICD-10-CM | POA: Diagnosis not present

## 2023-10-05 LAB — RAD ONC ARIA SESSION SUMMARY
Course Elapsed Days: 1
Plan Fractions Treated to Date: 2
Plan Prescribed Dose Per Fraction: 3 Gy
Plan Total Fractions Prescribed: 10
Plan Total Prescribed Dose: 30 Gy
Reference Point Dosage Given to Date: 6 Gy
Reference Point Session Dosage Given: 3 Gy
Session Number: 2

## 2023-10-05 NOTE — Addendum Note (Signed)
 Encounter addended by: Gladis Burnard SAILOR on: 10/05/2023 5:01 PM  Actions taken: Imaging Exam begun, Charge Capture section accepted

## 2023-10-05 NOTE — Addendum Note (Signed)
 Encounter addended by: Gladis Burnard SAILOR on: 10/05/2023 5:01 PM  Actions taken: Imaging Exam ended

## 2023-10-06 ENCOUNTER — Other Ambulatory Visit: Payer: Self-pay

## 2023-10-06 ENCOUNTER — Ambulatory Visit
Admission: RE | Admit: 2023-10-06 | Discharge: 2023-10-06 | Disposition: A | Discharge status: Home / Self Care | Source: Ambulatory Visit | Attending: Radiation Oncology | Admitting: Radiation Oncology

## 2023-10-06 DIAGNOSIS — Z510 Encounter for antineoplastic radiation therapy: Secondary | ICD-10-CM | POA: Diagnosis not present

## 2023-10-06 LAB — RAD ONC ARIA SESSION SUMMARY
Course Elapsed Days: 2
Plan Fractions Treated to Date: 3
Plan Prescribed Dose Per Fraction: 3 Gy
Plan Total Fractions Prescribed: 10
Plan Total Prescribed Dose: 30 Gy
Reference Point Dosage Given to Date: 9 Gy
Reference Point Session Dosage Given: 3 Gy
Session Number: 3

## 2023-10-07 ENCOUNTER — Other Ambulatory Visit: Payer: Self-pay

## 2023-10-07 ENCOUNTER — Ambulatory Visit
Admission: RE | Admit: 2023-10-07 | Discharge: 2023-10-07 | Disposition: A | Discharge status: Home / Self Care | Source: Ambulatory Visit | Attending: Radiation Oncology | Admitting: Radiation Oncology

## 2023-10-07 ENCOUNTER — Inpatient Hospital Stay

## 2023-10-07 ENCOUNTER — Other Ambulatory Visit: Payer: Self-pay | Admitting: Pharmacist

## 2023-10-07 DIAGNOSIS — Z510 Encounter for antineoplastic radiation therapy: Secondary | ICD-10-CM | POA: Diagnosis not present

## 2023-10-07 LAB — RAD ONC ARIA SESSION SUMMARY
Course Elapsed Days: 3
Plan Fractions Treated to Date: 4
Plan Prescribed Dose Per Fraction: 3 Gy
Plan Total Fractions Prescribed: 10
Plan Total Prescribed Dose: 30 Gy
Reference Point Dosage Given to Date: 12 Gy
Reference Point Session Dosage Given: 3 Gy
Session Number: 4

## 2023-10-08 ENCOUNTER — Ambulatory Visit
Admission: RE | Admit: 2023-10-08 | Discharge: 2023-10-08 | Disposition: A | Discharge status: Home / Self Care | Source: Ambulatory Visit | Attending: Radiation Oncology | Admitting: Radiation Oncology

## 2023-10-08 ENCOUNTER — Ambulatory Visit: Admitting: Adult Health

## 2023-10-08 ENCOUNTER — Other Ambulatory Visit: Payer: Self-pay

## 2023-10-08 DIAGNOSIS — Z510 Encounter for antineoplastic radiation therapy: Secondary | ICD-10-CM | POA: Diagnosis not present

## 2023-10-08 LAB — RAD ONC ARIA SESSION SUMMARY
Course Elapsed Days: 4
Plan Fractions Treated to Date: 5
Plan Prescribed Dose Per Fraction: 3 Gy
Plan Total Fractions Prescribed: 10
Plan Total Prescribed Dose: 30 Gy
Reference Point Dosage Given to Date: 15 Gy
Reference Point Session Dosage Given: 3 Gy
Session Number: 5

## 2023-10-11 ENCOUNTER — Other Ambulatory Visit: Payer: Self-pay

## 2023-10-11 ENCOUNTER — Ambulatory Visit
Admission: RE | Admit: 2023-10-11 | Discharge: 2023-10-11 | Disposition: A | Discharge status: Home / Self Care | Source: Ambulatory Visit | Attending: Radiation Oncology | Admitting: Radiation Oncology

## 2023-10-11 ENCOUNTER — Encounter (INDEPENDENT_AMBULATORY_CARE_PROVIDER_SITE_OTHER): Payer: Self-pay

## 2023-10-11 ENCOUNTER — Other Ambulatory Visit (HOSPITAL_COMMUNITY): Payer: Self-pay

## 2023-10-11 DIAGNOSIS — Z510 Encounter for antineoplastic radiation therapy: Secondary | ICD-10-CM | POA: Diagnosis not present

## 2023-10-11 LAB — RAD ONC ARIA SESSION SUMMARY
Course Elapsed Days: 7
Plan Fractions Treated to Date: 6
Plan Prescribed Dose Per Fraction: 3 Gy
Plan Total Fractions Prescribed: 10
Plan Total Prescribed Dose: 30 Gy
Reference Point Dosage Given to Date: 18 Gy
Reference Point Session Dosage Given: 3 Gy
Session Number: 6

## 2023-10-11 NOTE — Progress Notes (Signed)
 Specialty Pharmacy Refill Coordination Note  MyChart Questionnaire Submission  Kathryn Lucas is a 63 y.o. female contacted today regarding refills of specialty medication(s) Xeloda .  Doses on hand: (Patient-Rptd) 16   Next cycle: 10/21/23  Patient requested: (Patient-Rptd) Delivery   Delivery date: 10/15/23   Verified address: (Patient-Rptd) [address] [place], [place] [address]  Medication will be filled on 10/14/23.

## 2023-10-12 ENCOUNTER — Ambulatory Visit
Admission: RE | Admit: 2023-10-12 | Discharge: 2023-10-12 | Disposition: A | Discharge status: Home / Self Care | Source: Ambulatory Visit | Attending: Radiation Oncology | Admitting: Radiation Oncology

## 2023-10-12 ENCOUNTER — Inpatient Hospital Stay

## 2023-10-12 ENCOUNTER — Other Ambulatory Visit: Payer: Self-pay

## 2023-10-12 DIAGNOSIS — Z510 Encounter for antineoplastic radiation therapy: Secondary | ICD-10-CM | POA: Diagnosis not present

## 2023-10-12 LAB — RAD ONC ARIA SESSION SUMMARY
Course Elapsed Days: 8
Plan Fractions Treated to Date: 7
Plan Prescribed Dose Per Fraction: 3 Gy
Plan Total Fractions Prescribed: 10
Plan Total Prescribed Dose: 30 Gy
Reference Point Dosage Given to Date: 21 Gy
Reference Point Session Dosage Given: 3 Gy
Session Number: 7

## 2023-10-13 ENCOUNTER — Ambulatory Visit
Admission: RE | Admit: 2023-10-13 | Discharge: 2023-10-13 | Disposition: A | Discharge status: Home / Self Care | Source: Ambulatory Visit | Attending: Radiation Oncology | Admitting: Radiation Oncology

## 2023-10-13 ENCOUNTER — Other Ambulatory Visit: Payer: Self-pay

## 2023-10-13 DIAGNOSIS — Z510 Encounter for antineoplastic radiation therapy: Secondary | ICD-10-CM | POA: Diagnosis not present

## 2023-10-13 LAB — RAD ONC ARIA SESSION SUMMARY
Course Elapsed Days: 9
Plan Fractions Treated to Date: 8
Plan Prescribed Dose Per Fraction: 3 Gy
Plan Total Fractions Prescribed: 10
Plan Total Prescribed Dose: 30 Gy
Reference Point Dosage Given to Date: 24 Gy
Reference Point Session Dosage Given: 3 Gy
Session Number: 8

## 2023-10-14 ENCOUNTER — Other Ambulatory Visit: Payer: Self-pay

## 2023-10-14 ENCOUNTER — Ambulatory Visit
Admission: RE | Admit: 2023-10-14 | Discharge: 2023-10-14 | Disposition: A | Discharge status: Home / Self Care | Source: Ambulatory Visit | Attending: Radiation Oncology | Admitting: Radiation Oncology

## 2023-10-14 DIAGNOSIS — Z510 Encounter for antineoplastic radiation therapy: Secondary | ICD-10-CM | POA: Diagnosis not present

## 2023-10-14 LAB — RAD ONC ARIA SESSION SUMMARY
Course Elapsed Days: 10
Plan Fractions Treated to Date: 9
Plan Prescribed Dose Per Fraction: 3 Gy
Plan Total Fractions Prescribed: 10
Plan Total Prescribed Dose: 30 Gy
Reference Point Dosage Given to Date: 27 Gy
Reference Point Session Dosage Given: 3 Gy
Session Number: 9

## 2023-10-15 ENCOUNTER — Other Ambulatory Visit: Payer: Self-pay

## 2023-10-15 ENCOUNTER — Ambulatory Visit
Admission: RE | Admit: 2023-10-15 | Discharge: 2023-10-15 | Disposition: A | Discharge status: Home / Self Care | Source: Ambulatory Visit | Attending: Radiation Oncology | Admitting: Radiation Oncology

## 2023-10-15 DIAGNOSIS — C7931 Secondary malignant neoplasm of brain: Secondary | ICD-10-CM | POA: Insufficient documentation

## 2023-10-15 DIAGNOSIS — C50512 Malignant neoplasm of lower-outer quadrant of left female breast: Secondary | ICD-10-CM | POA: Insufficient documentation

## 2023-10-15 DIAGNOSIS — Z510 Encounter for antineoplastic radiation therapy: Secondary | ICD-10-CM | POA: Diagnosis not present

## 2023-10-15 DIAGNOSIS — C7951 Secondary malignant neoplasm of bone: Secondary | ICD-10-CM | POA: Insufficient documentation

## 2023-10-15 LAB — RAD ONC ARIA SESSION SUMMARY
Course Elapsed Days: 11
Plan Fractions Treated to Date: 10
Plan Prescribed Dose Per Fraction: 3 Gy
Plan Total Fractions Prescribed: 10
Plan Total Prescribed Dose: 30 Gy
Reference Point Dosage Given to Date: 30 Gy
Reference Point Session Dosage Given: 3 Gy
Session Number: 10

## 2023-10-19 ENCOUNTER — Other Ambulatory Visit: Payer: Self-pay

## 2023-10-19 ENCOUNTER — Other Ambulatory Visit (HOSPITAL_COMMUNITY): Payer: Self-pay

## 2023-10-19 DIAGNOSIS — Z510 Encounter for antineoplastic radiation therapy: Secondary | ICD-10-CM | POA: Diagnosis not present

## 2023-10-19 NOTE — Progress Notes (Signed)
 Specialty Pharmacy Ongoing Clinical Assessment Note  Kathryn Lucas is a 63 y.o. female who is being followed by the specialty pharmacy service for RxSp Oncology   Patient's specialty medication(s) reviewed today: Capecitabine  (XELODA )   Missed doses in the last 4 weeks: 10 (patient misunderstood directions, see intervention notes)   Patient/Caregiver asked additional questions regarding see intervention notes below.  Therapeutic benefit summary: Unable to assess   Adverse events/side effects summary: Experienced adverse events/side effects (Patient reported experiencing dry mouth and dizziness. I recommended staying well-hydrated and suggested using Biotene mouthwash to help relieve dry mouth symptoms. Patient expressed understanding of the recommendations.)   Patient's therapy is appropriate to: Continue    Goals Addressed             This Visit's Progress    Maintain optimal adherence to therapy   Improving    Patient is initiating therapy. Patient will maintain adherence. Patient was not taking as prescribed, she was counseled and details are available in the clinical intervention notes.          Follow up: 3 months  Silvano LOISE Dolly Specialty Pharmacist    Clinical Intervention Note  Clinical Intervention Notes: Patient was asked to describe how she was taking her medication, and it was discovered that she had been taking 1 tablet (500 mg) three times daily with meals, instead of the prescribed 3 tablets (1500 mg) twice daily with a meal. I provided thorough counseling on the correct dosing schedule, addressed her concerns about potential side effects, and discussed appropriate management strategies should they occur. I encouraged her to contact us  with any issues. To confirm understanding, I had her repeat the correct dosing instructions: 3 tablets with breakfast and 3 tablets with her evening meal to ensure appropriate spacing. I also reinforced the cycle of taking  the medication for 14 days followed by a 7-day break, which she had previously understood. I notified Norleen Parkinson, PharmD so he can follow up with her provider.   Clinical Intervention Outcomes: Improved therapy adherence   Silvano LOISE Dolly Specialty Pharmacist

## 2023-10-20 ENCOUNTER — Encounter: Payer: Self-pay | Admitting: Hematology and Oncology

## 2023-10-21 ENCOUNTER — Inpatient Hospital Stay (HOSPITAL_BASED_OUTPATIENT_CLINIC_OR_DEPARTMENT_OTHER): Admitting: Internal Medicine

## 2023-10-21 DIAGNOSIS — Z9012 Acquired absence of left breast and nipple: Secondary | ICD-10-CM | POA: Insufficient documentation

## 2023-10-21 DIAGNOSIS — Z79811 Long term (current) use of aromatase inhibitors: Secondary | ICD-10-CM | POA: Insufficient documentation

## 2023-10-21 DIAGNOSIS — Z9221 Personal history of antineoplastic chemotherapy: Secondary | ICD-10-CM | POA: Insufficient documentation

## 2023-10-21 DIAGNOSIS — C7951 Secondary malignant neoplasm of bone: Secondary | ICD-10-CM | POA: Insufficient documentation

## 2023-10-21 DIAGNOSIS — R569 Unspecified convulsions: Secondary | ICD-10-CM | POA: Insufficient documentation

## 2023-10-21 DIAGNOSIS — Z923 Personal history of irradiation: Secondary | ICD-10-CM | POA: Insufficient documentation

## 2023-10-21 DIAGNOSIS — R197 Diarrhea, unspecified: Secondary | ICD-10-CM | POA: Insufficient documentation

## 2023-10-21 DIAGNOSIS — Z17411 Hormone receptor positive with human epidermal growth factor receptor 2 negative status: Secondary | ICD-10-CM | POA: Insufficient documentation

## 2023-10-21 DIAGNOSIS — C50512 Malignant neoplasm of lower-outer quadrant of left female breast: Secondary | ICD-10-CM | POA: Insufficient documentation

## 2023-10-21 DIAGNOSIS — C7931 Secondary malignant neoplasm of brain: Secondary | ICD-10-CM | POA: Insufficient documentation

## 2023-10-21 DIAGNOSIS — G629 Polyneuropathy, unspecified: Secondary | ICD-10-CM | POA: Insufficient documentation

## 2023-10-21 DIAGNOSIS — Z79899 Other long term (current) drug therapy: Secondary | ICD-10-CM | POA: Insufficient documentation

## 2023-10-21 DIAGNOSIS — R634 Abnormal weight loss: Secondary | ICD-10-CM | POA: Insufficient documentation

## 2023-10-21 NOTE — Progress Notes (Signed)
 I connected with Kathryn Lucas on 10/21/23 at 11:00 AM EDT by telephone visit and verified that I am speaking with the correct person using two identifiers.  I discussed the limitations, risks, security and privacy concerns of performing an evaluation and management service by telemedicine and the availability of in-person appointments. I also discussed with the patient that there may be a patient responsible charge related to this service. The patient expressed understanding and agreed to proceed.  Other persons participating in the visit and their role in the encounter:  n/a  Patient's location:  Home Provider's location:  Office Chief Complaint:  Malignant neoplasm metastatic to bone [facility]) - Plan: CT HEAD W & WO CONTRAST ( )  Focal seizure (HCC) - Plan: CT HEAD W & WO CONTRAST ( )  History of Present Ilness: Kathryn Lucas reports no further seizures.  Clinically stable and seizure free, doing much better with the Zonisamide  compared to the Vimpat .  Currently dosing the Zonisamide  100mg  daily.  Remains on Xeloda  for recently, having recently completed skull radiation treatments with Dr. Lenn.  Observations: Language and cognition at baseline.    Assessment and Plan: Malignant neoplasm metastatic to bone [facility]) - Plan: CT HEAD W & WO CONTRAST ( )  Focal seizure (HCC) - Plan: CT HEAD W & WO CONTRAST ( )  Clinically improved on Zonisamide  100mg  daily.    Follow Up Instructions:  We ask that Kathryn Lucas return to clinic in 1 month for with repeat CT head, or sooner if needed.  I discussed the assessment and treatment plan with the patient.  The patient was provided an opportunity to ask questions and all were answered.  The patient agreed with the plan and demonstrated understanding of the instructions.    The patient was advised to call back or seek an in-person evaluation if the symptoms worsen or if the condition fails to improve as anticipated.    Jayin Derousse K Cara Thaxton,  MD   I provided 20 minutes of non face-to-face telephone visit time during this encounter, and > 50% was spent counseling as documented under my assessment & plan.

## 2023-10-22 ENCOUNTER — Encounter: Payer: Self-pay | Admitting: Adult Health

## 2023-10-22 ENCOUNTER — Inpatient Hospital Stay

## 2023-10-22 ENCOUNTER — Inpatient Hospital Stay (HOSPITAL_BASED_OUTPATIENT_CLINIC_OR_DEPARTMENT_OTHER): Attending: Hematology and Oncology | Admitting: Adult Health

## 2023-10-22 VITALS — BP 137/83 | HR 121 | Temp 98.4°F | Resp 18 | Ht 70.0 in | Wt 143.6 lb

## 2023-10-22 DIAGNOSIS — C799 Secondary malignant neoplasm of unspecified site: Secondary | ICD-10-CM

## 2023-10-22 DIAGNOSIS — C50512 Malignant neoplasm of lower-outer quadrant of left female breast: Secondary | ICD-10-CM

## 2023-10-22 DIAGNOSIS — Z510 Encounter for antineoplastic radiation therapy: Secondary | ICD-10-CM | POA: Diagnosis not present

## 2023-10-22 DIAGNOSIS — Z170 Estrogen receptor positive status [ER+]: Secondary | ICD-10-CM | POA: Diagnosis not present

## 2023-10-22 LAB — CBC WITH DIFFERENTIAL (CANCER CENTER ONLY)
Abs Immature Granulocytes: 0.02 K/uL (ref 0.00–0.07)
Basophils Absolute: 0 K/uL (ref 0.0–0.1)
Basophils Relative: 1 %
Eosinophils Absolute: 0 K/uL (ref 0.0–0.5)
Eosinophils Relative: 1 %
HCT: 32.1 % — ABNORMAL LOW (ref 36.0–46.0)
Hemoglobin: 10.9 g/dL — ABNORMAL LOW (ref 12.0–15.0)
Immature Granulocytes: 1 %
Lymphocytes Relative: 7 %
Lymphs Abs: 0.3 K/uL — ABNORMAL LOW (ref 0.7–4.0)
MCH: 36.7 pg — ABNORMAL HIGH (ref 26.0–34.0)
MCHC: 34 g/dL (ref 30.0–36.0)
MCV: 108.1 fL — ABNORMAL HIGH (ref 80.0–100.0)
Monocytes Absolute: 0.3 K/uL (ref 0.1–1.0)
Monocytes Relative: 9 %
Neutro Abs: 2.9 K/uL (ref 1.7–7.7)
Neutrophils Relative %: 81 %
Platelet Count: 158 K/uL (ref 150–400)
RBC: 2.97 MIL/uL — ABNORMAL LOW (ref 3.87–5.11)
RDW: 19.8 % — ABNORMAL HIGH (ref 11.5–15.5)
WBC Count: 3.6 K/uL — ABNORMAL LOW (ref 4.0–10.5)
nRBC: 0 % (ref 0.0–0.2)

## 2023-10-22 LAB — CMP (CANCER CENTER ONLY)
ALT: 14 U/L (ref 0–44)
AST: 61 U/L — ABNORMAL HIGH (ref 15–41)
Albumin: 3.9 g/dL (ref 3.5–5.0)
Alkaline Phosphatase: 164 U/L — ABNORMAL HIGH (ref 38–126)
Anion gap: 6 (ref 5–15)
BUN: 10 mg/dL (ref 8–23)
CO2: 23 mmol/L (ref 22–32)
Calcium: 8.3 mg/dL — ABNORMAL LOW (ref 8.9–10.3)
Chloride: 107 mmol/L (ref 98–111)
Creatinine: 0.57 mg/dL (ref 0.44–1.00)
GFR, Estimated: >60 mL/min (ref >60)
Glucose, Bld: 108 mg/dL — ABNORMAL HIGH (ref 70–99)
Potassium: 4.1 mmol/L (ref 3.5–5.1)
Sodium: 136 mmol/L (ref 135–145)
Total Bilirubin: 0.8 mg/dL (ref 0.0–1.2)
Total Protein: 6.5 g/dL (ref 6.5–8.1)

## 2023-10-22 MED ORDER — CAPECITABINE 500 MG PO TABS: ORAL_TABLET | ORAL | Status: DC

## 2023-10-22 MED ORDER — SODIUM CHLORIDE 0.9% FLUSH
10.0000 mL | Freq: Once | INTRAVENOUS | Status: AC
Start: 2023-10-22 — End: 2023-10-22
  Administered 2023-10-22: 10 mL

## 2023-10-22 NOTE — Progress Notes (Signed)
 [facility] Cancer Follow up:    Pahwani, Kathryn SAUNDERS, MD [address] [place] [address]   DIAGNOSIS:  Cancer Staging  Malignant neoplasm of lower-outer quadrant of left breast of female, estrogen receptor positive (HCC) Staging form: Breast, AJCC 8th Edition - Clinical stage from 07/13/2017: Stage IIA (cT3, cN0, cM0, G1, ER+, PR+, HER2-) - Signed by Crawford Morna Pickle, NP on 05/18/2018 Neoadjuvant therapy: No Histologic grading system: 3 grade system Laterality: Left - Pathologic: ypT3, ypN1a, cM0, G1, ER+, PR+, HER2- - Signed by Odean Potts, MD on 11/02/2017 Stage prefix: Post-therapy Neoadjuvant therapy: Yes Response to neoadjuvant therapy: No response Histologic grading system: 3 grade system Laterality: Left Lymph-vascular invasion (LVI): BOTH lymphatic and small vessel AND venous (large vessel) invasion  SUMMARY OF ONCOLOGIC HISTORY: Oncology History  Malignant neoplasm of lower-outer quadrant of left breast of female, estrogen receptor positive (HCC)  06/11/2016 Mammogram   Palpable left breast masses 3:00 position: 2.2 cm; 5:30 position: 2.5 cm; 6:30 position: 0.7 cm   06/19/2016 Initial Diagnosis   Left breast biopsy 3:30: IDC with DCIS grade 1, ER 90%, PR 50%, Ki-67 15%, HER-2 negative ratio 1.13; biopsy 5:30 position: IDC grade 1   07/13/2016 Breast MRI   Large area of abnormal enhancement lower inner and lower outer quadrants left breast spanning 9 cm x 6.4 cm x 5.3 cm, no abnormal enlarged lymph nodes; T3 N0 stage II a (New AJCC staging)    07/15/2016 - 12/08/2017 Anti-estrogen oral therapy   Neoadjuvant anastrozole  1 mg daily   07/17/2016 Oncotype testing   Testing done on the biopsy: Oncotype DX score 22, intermediate risk   02/02/2017 Breast MRI   Left breast multicentric disease unchanged measuring 2.7 x 1.6 cm.  Mass in the non-mass enhancement are also not significantly changed measuring 6.2 x 2.4 cm. new enhancing mass within the  outer right breast 7 mm which could be fat necrosis or inclusion cyst    02/09/2017 Imaging   Ultrasound of the right breast lesion noted on MRI: No sonographic finding corresponds to the abnormality noted on MRI   07/13/2017 Cancer Staging   Staging form: Breast, AJCC 8th Edition - Clinical stage from 07/13/2017: Stage IIA (cT3, cN0, cM0, G1, ER+, PR+, HER2-) - Signed by Crawford Morna Pickle, NP on 05/18/2018   10/26/2017 Surgery   Left mastectomy: IDC grade 1, 2 foci largest spans 8.5 cm, intermediate grade DCIS, lymphovascular invasion identified, perineural invasion identified, 1/2 lymph nodes positive with extracapsular extension, ER 9200%, PR 5 to 50%, HER-2 negative, Ki-67 10 to 15%, T3N1A Mammaprint: low risk   11/02/2017 Cancer Staging   Staging form: Breast, AJCC 8th Edition - Pathologic: No Stage Recommended (ypT3, pN1a, cM0, G1, ER+, PR+, HER2-) - Signed by Odean Potts, MD on 11/02/2017   12/08/2017 - 01/26/2018 Radiation Therapy   Adjuvant radiation therapy    02/2018 -  Anti-estrogen oral therapy   Anastrozole  1 mg daily adjuvant therapy   10/21/2022 - 07/06/2023 Chemotherapy   Patient is on Treatment Plan : BREAST METASTATIC Fam-Trastuzumab Deruxtecan-nxki  (Enhertu ) (5.4) q21d       CURRENT THERAPY: capecitabine  (1500 mg po bid 8/6)  INTERVAL HISTORY:  Discussed the use of AI scribe software for clinical note transcription with the patient, who gave verbal consent to proceed.  History of Present Illness Kathryn Lucas is a 63 year old female with breast cancer who presents for follow-up.  She is undergoing treatment with capecitabine , taking three tablets  twice a day, and experiences nausea and vomiting, particularly after medication intake. She was taking the capecitabine  incorrectly, however, she began three tablets BID on Wednesday, August 6th, 2025.  She is set to begin palliative radiation therapy, to her skin on Monday. She experiences hair loss and skin  irritation, particularly on her forehead, which is itchy and reddish-brown from her previous cranial radiation. She applies Aquaphor for skin discomfort. Additional radiation is scheduled next week for lesions on her back.  Diarrhea began today, with two episodes so far. The smell induces nausea, requiring ventilation and air fresheners.  She takes Zonisamide  100 mg, for seizures, which causes dizziness. 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     Patient Active Problem List   Diagnosis Date Noted   Focal seizure (HCC) 09/02/2023   Metastatic 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) 10/14/2022   Elevated liver enzymes 10/14/2022   Unexplained weight loss 10/14/2022   Neoplasm related pain 10/14/2022   Metastatic malignant neoplasm (HCC) 12/19/2021   Family history of breast cancer    History of therapeutic radiation 04/27/2018   Breast cancer, left breast (HCC) 10/26/2017   Malignant neoplasm of lower-outer quadrant of left breast of female, estrogen receptor positive (HCC) 07/15/2016   Plantar fasciitis 07/30/2015   Metatarsalgia 10/18/2014    is allergic to percocet [oxycodone -acetaminophen ].  MEDICAL HISTORY: Past Medical History:  Diagnosis Date   Breast cancer (HCC)    left breast cancer   Cancer (HCC) 09/2017   left breast cancer   Family history of breast cancer    Hypothyroidism    Personal history of radiation therapy 2019   Thyroid  disease     SURGICAL HISTORY: Past Surgical History:  Procedure Laterality Date   BREAST BIOPSY Left 2018   BREAST BIOPSY Left 2019   BREAST RECONSTRUCTION WITH PLACEMENT OF TISSUE EXPANDER AND ALLODERM Left 10/26/2017   Procedure: LEFT BREAST RECONSTRUCTION WITH PLACEMENT OF TISSUE EXPANDER AND ALLODERM;  Surgeon: Arelia Filippo, MD;  Location: [facility];  Service: Plastics;  Laterality: Left;   IR IMAGING GUIDED PORT INSERTION  04/27/2023   MASTECTOMY Left 2019   MASTECTOMY WITH RADIOACTIVE SEED GUIDED EXCISION AND  AXILLARY SENTINEL LYMPH NODE BIOPSY Left 10/26/2017   Procedure: LEFT MASTECTOMY WITH SEED TARGETED  LEFT AXILLARY LYMPH NODE EXCISION AND LEFT SENTINEL LYMPH NODE BIOPSY;  Surgeon: Aron Shoulders, MD;  Location: [facility];  Service: General;  Laterality: Left;   TONSILLECTOMY     WISDOM TOOTH EXTRACTION      SOCIAL HISTORY: Social History   Socioeconomic History   Marital status: Single    Spouse name: Not on file   Number of children: Not on file   Years of education: Not on file   Highest education level: Not on file  Occupational History   Not on file  Tobacco Use   Smoking status: Never   Smokeless tobacco: Never  Vaping Use   Vaping status: Never Used  Substance and Sexual Activity   Alcohol use: Yes    Alcohol/week: 0.0 standard drinks of alcohol    Comment: social   Drug use: Never   Sexual activity: Not on file  Other Topics Concern   Not on file  Social History Narrative   Lives alone and one dog.   Social Drivers of Corporate investment banker Strain: Not on file  Food Insecurity: No Food Insecurity (09/14/2022)   Hunger Vital Sign    Worried About Running Out of Food in the  Last Year: Never true    Ran Out of Food in the Last Year: Never true  Transportation Needs: No Transportation Needs (09/14/2022)   PRAPARE - Administrator, Civil Service (Medical): No    Lack of Transportation (Non-Medical): No  Physical Activity: Not on file  Stress: Not on file  Social Connections: Not on file  Intimate Partner Violence: Not At Risk (09/14/2022)   Humiliation, Afraid, Rape, and Kick questionnaire    Fear of Current or Ex-Partner: No    Emotionally Abused: No    Physically Abused: No    Sexually Abused: No    FAMILY HISTORY: Family History  Problem Relation Age of Onset   Breast cancer Mother 41   Stroke Sister        thought to be due to tamoxifen use   Dementia Maternal Grandmother    COPD Paternal Grandfather     Review of  Systems  Constitutional:  Negative for appetite change, chills, fatigue, fever and unexpected weight change.  HENT:   Negative for hearing loss, lump/mass and trouble swallowing.   Eyes:  Negative for eye problems and icterus.  Respiratory:  Negative for chest tightness, cough and shortness of breath.   Cardiovascular:  Negative for chest pain, leg swelling and palpitations.  Gastrointestinal:  Negative for abdominal distention, abdominal pain, constipation, diarrhea, nausea and vomiting.  Endocrine: Negative for hot flashes.  Genitourinary:  Negative for difficulty urinating.   Musculoskeletal:  Negative for arthralgias.  Skin:  Negative for itching and rash.  Neurological:  Negative for dizziness, extremity weakness, headaches and numbness.  Hematological:  Negative for adenopathy. Does not bruise/bleed easily.  Psychiatric/Behavioral:  Negative for depression. The patient is not nervous/anxious.       PHYSICAL EXAMINATION    Vitals:   10/22/23 1347  BP: 137/83  Pulse: (!) 121  Resp: 18  Temp: 98.4 F (36.9 C)  SpO2: 100%    Physical Exam Constitutional:      General: She is not in acute distress.    Appearance: Normal appearance. She is not toxic-appearing.  HENT:     Head: Normocephalic and atraumatic.     Mouth/Throat:     Mouth: Mucous membranes are moist.     Pharynx: Oropharynx is clear. No oropharyngeal exudate or posterior oropharyngeal erythema.  Eyes:     General: No scleral icterus. Cardiovascular:     Rate and Rhythm: Normal rate and regular rhythm.     Pulses: Normal pulses.     Heart sounds: Normal heart sounds.  Pulmonary:     Effort: Pulmonary effort is normal.     Breath sounds: Normal breath sounds.  Abdominal:     General: Abdomen is flat. Bowel sounds are normal. There is no distension.     Palpations: Abdomen is soft.     Tenderness: There is no abdominal tenderness.  Musculoskeletal:        General: No swelling.     Cervical back: Neck  supple.  Lymphadenopathy:     Cervical: No cervical adenopathy.  Skin:    General: Skin is warm and dry.     Findings: No rash.  Neurological:     General: No focal deficit present.     Mental Status: She is alert.  Psychiatric:        Mood and Affect: Mood normal.        Behavior: Behavior normal.     LABORATORY DATA:  CBC    Component Value Date/Time  WBC 3.6 (L) 10/22/2023 1305   WBC 1.1 (LL) 08/20/2023 1036   RBC 2.97 (L) 10/22/2023 1305   HGB 10.9 (L) 10/22/2023 1305   HCT 32.1 (L) 10/22/2023 1305   PLT 158 10/22/2023 1305   MCV 108.1 (H) 10/22/2023 1305   MCH 36.7 (H) 10/22/2023 1305   MCHC 34.0 10/22/2023 1305   RDW 19.8 (H) 10/22/2023 1305   LYMPHSABS 0.3 (L) 10/22/2023 1305   MONOABS 0.3 10/22/2023 1305   EOSABS 0.0 10/22/2023 1305   BASOSABS 0.0 10/22/2023 1305    CMP     Component Value Date/Time   NA 136 10/22/2023 1305   K 4.1 10/22/2023 1305   CL 107 10/22/2023 1305   CO2 23 10/22/2023 1305   GLUCOSE 108 (H) 10/22/2023 1305   BUN 10 10/22/2023 1305   CREATININE 0.57 10/22/2023 1305   CALCIUM  8.3 (L) 10/22/2023 1305   PROT 6.5 10/22/2023 1305   ALBUMIN 3.9 10/22/2023 1305   AST 61 (H) 10/22/2023 1305   ALT 14 10/22/2023 1305   ALKPHOS 164 (H) 10/22/2023 1305   BILITOT 0.8 10/22/2023 1305   GFRNONAA >60 10/22/2023 1305     ASSESSMENT and THERAPY PLAN:   Malignant neoplasm of lower-outer quadrant of left breast of female, estrogen receptor positive (HCC) 10/26/17: Left mastectomy: IDC grade 1, 2 foci largest spans 8.5 cm, intermediate grade DCIS, lymphovascular invasion identified, perineural invasion identified, 1/2 lymph nodes positive with extracapsular extension, ER 9200%, PR 5 to 50%, HER-2 negative, Ki-67 10 to 15%, T3N1A   Oncotype DX score 22, intermediate risk, chemotherapy not felt to have significant benefit.   Treatment Summary: 1. Antiestrogen therapy with anastrozole  1 mg daily started 07/15/2016 2. Mastectomy 10/26/2017,  Mammaprint low risk luminal type A 3. Followed by adjuvant radiation 12/08/17- 01/26/18  4. Followed by adjuvant antiestrogen therapy anastrozole  started 01/17/2018 (originally started 07/15/2016) 5.  August 2023: Low back pain: Large lesion in the sacrum biopsy of sacrum: 12/26/2021: Metastatic breast cancer, ER 90%, PR 10%, HER2 negative (0) 6. Ibrance  along with Faslodex  started 12/25/2021-10/14/2022 7. Palliative radiation to the sacrum completed 01/19/2022 8.  Enhertu  started 10/21/2022-07/06/2023 9.  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 Radiation ongoing with additional sessions planned. Hair loss managed by shaving. Forehead skin changes expected with treatment. - Continue capecitabine , adjust to two tablets twice a day Monday through Friday during radiation. - Continue radiation therapy as scheduled.  Chemotherapy-induced diarrhea Recent onset diarrhea with two episodes today. Typically three bowel movements per day, diarrhea not severe but malodorous. -Counseled on imodium if needed.  RTC on 8/18 for labs and f/u with CPP Norleen Parkinson.    The above was reviewed with Dr. Odean in detail and he agrees with dosing change to Capecitabine  BID while Kathryn Lucas undergoes radiation to her skin metastases.      All questions were answered. The patient knows to call the clinic with any problems, questions or concerns. We can certainly see the patient much sooner if necessary.  Total encounter time:20 minutes*in face-to-face visit time,  chart review, lab review, care coordination, order entry, and documentation of the encounter time.    Morna  Crawford, NP 10/24/23 2:46 PM Medical Oncology and Hematology [facility] [address] [place], [place] [address] Tel. [phone]    Fax. [phone]  *Total Encounter Time as defined by the Centers for Medicare and Medicaid Services includes, in addition to the face-to-face time of a patient visit (documented in the note above) non-face-to-face time: obtaining and reviewing outside history, ordering and reviewing medications, tests or procedures, care coordination (communications with other health care professionals or caregivers) and documentation in the medical record.

## 2023-10-24 ENCOUNTER — Encounter: Payer: Self-pay | Admitting: Hematology and Oncology

## 2023-10-24 NOTE — Assessment & Plan Note (Signed)
 10/26/17: Left mastectomy: IDC grade 1, 2 foci largest spans 8.5 cm, intermediate grade DCIS, lymphovascular invasion identified, perineural invasion identified, 1/2 lymph nodes positive with extracapsular extension, ER 9200%, PR 5 to 50%, HER-2 negative, Ki-67 10 to 15%, T3N1A   Oncotype DX score 22, intermediate risk, chemotherapy not felt to have significant benefit.   Treatment Summary: 1. Antiestrogen therapy with anastrozole  1 mg daily started 07/15/2016 2. Mastectomy 10/26/2017, Mammaprint low risk luminal type A 3. Followed by adjuvant radiation 12/08/17- 01/26/18  4. Followed by adjuvant antiestrogen therapy anastrozole  started 01/17/2018 (originally started 07/15/2016) 5.  August 2023: Low back pain: Large lesion in the sacrum biopsy of sacrum: 12/26/2021: Metastatic breast cancer, ER 90%, PR 10%, HER2 negative (0) 6. Ibrance  along with Faslodex  started 12/25/2021-10/14/2022 7. Palliative radiation to the sacrum completed 01/19/2022 8.  Enhertu  started 10/21/2022-07/06/2023 9.  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 Radiation ongoing with additional sessions planned. Hair loss managed by shaving. Forehead skin changes expected with treatment. - Continue capecitabine , adjust to two tablets twice a day Monday through Friday during radiation. - Continue radiation therapy as scheduled.  Chemotherapy-induced diarrhea Recent onset diarrhea with two episodes today. Typically three bowel movements per day, diarrhea not severe but  malodorous. -Counseled on imodium if needed.  RTC on 8/18 for labs and f/u with CPP Kathryn Lucas.    The above was reviewed with Dr. Odean in detail and he agrees with dosing change to Capecitabine  BID while Kathryn Lucas undergoes radiation to her skin metastases.

## 2023-10-25 ENCOUNTER — Ambulatory Visit: Admitting: Pharmacist

## 2023-10-25 ENCOUNTER — Other Ambulatory Visit

## 2023-10-25 ENCOUNTER — Other Ambulatory Visit: Payer: Self-pay

## 2023-10-25 ENCOUNTER — Encounter: Payer: Self-pay | Admitting: Pharmacist

## 2023-10-25 ENCOUNTER — Ambulatory Visit
Admission: RE | Admit: 2023-10-25 | Discharge: 2023-10-25 | Disposition: A | Discharge status: Home / Self Care | Source: Ambulatory Visit | Attending: Radiation Oncology | Admitting: Radiation Oncology

## 2023-10-25 ENCOUNTER — Ambulatory Visit

## 2023-10-25 DIAGNOSIS — Z510 Encounter for antineoplastic radiation therapy: Secondary | ICD-10-CM | POA: Diagnosis not present

## 2023-10-25 LAB — RAD ONC ARIA SESSION SUMMARY
Course Elapsed Days: 21
Plan Fractions Treated to Date: 1
Plan Fractions Treated to Date: 1
Plan Prescribed Dose Per Fraction: 4 Gy
Plan Prescribed Dose Per Fraction: 4 Gy
Plan Total Fractions Prescribed: 10
Plan Total Fractions Prescribed: 10
Plan Total Prescribed Dose: 40 Gy
Plan Total Prescribed Dose: 40 Gy
Reference Point Dosage Given to Date: 8 Gy
Reference Point Session Dosage Given: 8 Gy
Session Number: 11

## 2023-10-26 ENCOUNTER — Ambulatory Visit
Admission: RE | Admit: 2023-10-26 | Discharge: 2023-10-26 | Disposition: A | Discharge status: Home / Self Care | Source: Ambulatory Visit | Attending: Radiation Oncology | Admitting: Radiation Oncology

## 2023-10-26 ENCOUNTER — Other Ambulatory Visit: Payer: Self-pay

## 2023-10-26 DIAGNOSIS — Z510 Encounter for antineoplastic radiation therapy: Secondary | ICD-10-CM | POA: Diagnosis not present

## 2023-10-26 LAB — RAD ONC ARIA SESSION SUMMARY
Course Elapsed Days: 22
Plan Fractions Treated to Date: 2
Plan Fractions Treated to Date: 2
Plan Prescribed Dose Per Fraction: 4 Gy
Plan Prescribed Dose Per Fraction: 4 Gy
Plan Total Fractions Prescribed: 10
Plan Total Fractions Prescribed: 10
Plan Total Prescribed Dose: 40 Gy
Plan Total Prescribed Dose: 40 Gy
Reference Point Dosage Given to Date: 16 Gy
Reference Point Session Dosage Given: 8 Gy
Session Number: 12

## 2023-10-26 LAB — CANCER ANTIGEN 27.29: CA 27.29: 1354.4 U/mL — ABNORMAL HIGH (ref 0.0–38.6)

## 2023-10-27 ENCOUNTER — Other Ambulatory Visit: Payer: Self-pay | Admitting: *Deleted

## 2023-10-27 ENCOUNTER — Ambulatory Visit
Admission: RE | Admit: 2023-10-27 | Discharge: 2023-10-27 | Disposition: A | Discharge status: Home / Self Care | Source: Ambulatory Visit | Attending: Radiation Oncology | Admitting: Radiation Oncology

## 2023-10-27 ENCOUNTER — Other Ambulatory Visit: Payer: Self-pay

## 2023-10-27 DIAGNOSIS — C7951 Secondary malignant neoplasm of bone: Secondary | ICD-10-CM

## 2023-10-27 DIAGNOSIS — Z510 Encounter for antineoplastic radiation therapy: Secondary | ICD-10-CM | POA: Diagnosis not present

## 2023-10-27 LAB — RAD ONC ARIA SESSION SUMMARY
Course Elapsed Days: 23
Plan Fractions Treated to Date: 3
Plan Fractions Treated to Date: 3
Plan Prescribed Dose Per Fraction: 4 Gy
Plan Prescribed Dose Per Fraction: 4 Gy
Plan Total Fractions Prescribed: 10
Plan Total Fractions Prescribed: 10
Plan Total Prescribed Dose: 40 Gy
Plan Total Prescribed Dose: 40 Gy
Reference Point Dosage Given to Date: 24 Gy
Reference Point Session Dosage Given: 8 Gy
Session Number: 13

## 2023-10-28 ENCOUNTER — Encounter: Payer: Self-pay | Admitting: Internal Medicine

## 2023-10-28 ENCOUNTER — Other Ambulatory Visit: Payer: Self-pay

## 2023-10-28 ENCOUNTER — Ambulatory Visit
Admission: RE | Admit: 2023-10-28 | Discharge: 2023-10-28 | Disposition: A | Discharge status: Home / Self Care | Source: Ambulatory Visit | Attending: Radiation Oncology | Admitting: Radiation Oncology

## 2023-10-28 DIAGNOSIS — Z510 Encounter for antineoplastic radiation therapy: Secondary | ICD-10-CM | POA: Diagnosis not present

## 2023-10-28 LAB — RAD ONC ARIA SESSION SUMMARY
Course Elapsed Days: 24
Plan Fractions Treated to Date: 4
Plan Fractions Treated to Date: 4
Plan Prescribed Dose Per Fraction: 4 Gy
Plan Prescribed Dose Per Fraction: 4 Gy
Plan Total Fractions Prescribed: 10
Plan Total Fractions Prescribed: 10
Plan Total Prescribed Dose: 40 Gy
Plan Total Prescribed Dose: 40 Gy
Reference Point Dosage Given to Date: 32 Gy
Reference Point Session Dosage Given: 8 Gy
Session Number: 14

## 2023-10-29 ENCOUNTER — Ambulatory Visit
Admission: RE | Admit: 2023-10-29 | Discharge: 2023-10-29 | Disposition: A | Discharge status: Home / Self Care | Source: Ambulatory Visit | Attending: Radiation Oncology | Admitting: Radiation Oncology

## 2023-10-29 ENCOUNTER — Other Ambulatory Visit: Payer: Self-pay

## 2023-10-29 ENCOUNTER — Telehealth: Payer: Self-pay | Admitting: Internal Medicine

## 2023-10-29 DIAGNOSIS — Z510 Encounter for antineoplastic radiation therapy: Secondary | ICD-10-CM | POA: Diagnosis not present

## 2023-10-29 LAB — RAD ONC ARIA SESSION SUMMARY
Course Elapsed Days: 25
Plan Fractions Treated to Date: 5
Plan Fractions Treated to Date: 5
Plan Prescribed Dose Per Fraction: 4 Gy
Plan Prescribed Dose Per Fraction: 4 Gy
Plan Total Fractions Prescribed: 10
Plan Total Fractions Prescribed: 10
Plan Total Prescribed Dose: 40 Gy
Plan Total Prescribed Dose: 40 Gy
Reference Point Dosage Given to Date: 40 Gy
Reference Point Session Dosage Given: 8 Gy
Session Number: 15

## 2023-10-29 NOTE — Telephone Encounter (Signed)
 Scheduled appointment per 8/7 los. Called and left VM with appointment details for the patient.

## 2023-11-01 ENCOUNTER — Ambulatory Visit: Admitting: Pharmacist

## 2023-11-01 ENCOUNTER — Other Ambulatory Visit

## 2023-11-01 ENCOUNTER — Ambulatory Visit
Admission: RE | Admit: 2023-11-01 | Discharge: 2023-11-01 | Discharge status: Home / Self Care | Source: Ambulatory Visit | Attending: Radiation Oncology | Admitting: Radiation Oncology

## 2023-11-01 ENCOUNTER — Other Ambulatory Visit: Payer: Self-pay

## 2023-11-01 DIAGNOSIS — Z510 Encounter for antineoplastic radiation therapy: Secondary | ICD-10-CM | POA: Diagnosis not present

## 2023-11-01 LAB — RAD ONC ARIA SESSION SUMMARY
Course Elapsed Days: 28
Plan Fractions Treated to Date: 6
Plan Fractions Treated to Date: 6
Plan Prescribed Dose Per Fraction: 4 Gy
Plan Prescribed Dose Per Fraction: 4 Gy
Plan Total Fractions Prescribed: 10
Plan Total Fractions Prescribed: 10
Plan Total Prescribed Dose: 40 Gy
Plan Total Prescribed Dose: 40 Gy
Reference Point Dosage Given to Date: 48 Gy
Reference Point Session Dosage Given: 8 Gy
Session Number: 16

## 2023-11-02 ENCOUNTER — Other Ambulatory Visit: Payer: Self-pay | Admitting: *Deleted

## 2023-11-02 ENCOUNTER — Other Ambulatory Visit: Payer: Self-pay | Admitting: Hematology and Oncology

## 2023-11-02 ENCOUNTER — Other Ambulatory Visit: Payer: Self-pay

## 2023-11-02 ENCOUNTER — Ambulatory Visit
Admission: RE | Admit: 2023-11-02 | Discharge: 2023-11-02 | Discharge status: Home / Self Care | Source: Ambulatory Visit | Attending: Radiation Oncology | Admitting: Radiation Oncology

## 2023-11-02 DIAGNOSIS — Z170 Estrogen receptor positive status [ER+]: Secondary | ICD-10-CM

## 2023-11-02 DIAGNOSIS — Z510 Encounter for antineoplastic radiation therapy: Secondary | ICD-10-CM | POA: Diagnosis not present

## 2023-11-02 LAB — RAD ONC ARIA SESSION SUMMARY
Course Elapsed Days: 29
Plan Fractions Treated to Date: 7
Plan Fractions Treated to Date: 7
Plan Prescribed Dose Per Fraction: 4 Gy
Plan Prescribed Dose Per Fraction: 4 Gy
Plan Total Fractions Prescribed: 10
Plan Total Fractions Prescribed: 10
Plan Total Prescribed Dose: 40 Gy
Plan Total Prescribed Dose: 40 Gy
Reference Point Dosage Given to Date: 56 Gy
Reference Point Session Dosage Given: 8 Gy
Session Number: 17

## 2023-11-03 ENCOUNTER — Other Ambulatory Visit: Payer: Self-pay

## 2023-11-03 ENCOUNTER — Ambulatory Visit
Admission: RE | Admit: 2023-11-03 | Discharge: 2023-11-03 | Disposition: A | Discharge status: Home / Self Care | Source: Ambulatory Visit | Attending: Radiation Oncology | Admitting: Radiation Oncology

## 2023-11-03 DIAGNOSIS — Z510 Encounter for antineoplastic radiation therapy: Secondary | ICD-10-CM | POA: Diagnosis not present

## 2023-11-03 LAB — RAD ONC ARIA SESSION SUMMARY
Course Elapsed Days: 30
Plan Fractions Treated to Date: 8
Plan Fractions Treated to Date: 8
Plan Prescribed Dose Per Fraction: 4 Gy
Plan Prescribed Dose Per Fraction: 4 Gy
Plan Total Fractions Prescribed: 10
Plan Total Fractions Prescribed: 10
Plan Total Prescribed Dose: 40 Gy
Plan Total Prescribed Dose: 40 Gy
Reference Point Dosage Given to Date: 64 Gy
Reference Point Session Dosage Given: 8 Gy
Session Number: 18

## 2023-11-03 NOTE — Addendum Note (Signed)
 Encounter addended by: Gladis Burnard SAILOR on: 11/03/2023 3:42 PM  Actions taken: Imaging Exam begun

## 2023-11-04 ENCOUNTER — Ambulatory Visit
Admission: RE | Admit: 2023-11-04 | Discharge: 2023-11-04 | Disposition: A | Discharge status: Home / Self Care | Source: Ambulatory Visit | Attending: Radiation Oncology | Admitting: Radiation Oncology

## 2023-11-04 ENCOUNTER — Encounter: Payer: Self-pay | Admitting: Hematology and Oncology

## 2023-11-04 ENCOUNTER — Telehealth: Payer: Self-pay | Admitting: *Deleted

## 2023-11-04 ENCOUNTER — Other Ambulatory Visit: Payer: Self-pay

## 2023-11-04 DIAGNOSIS — Z510 Encounter for antineoplastic radiation therapy: Secondary | ICD-10-CM | POA: Diagnosis not present

## 2023-11-04 LAB — RAD ONC ARIA SESSION SUMMARY
Course Elapsed Days: 31
Plan Fractions Treated to Date: 9
Plan Fractions Treated to Date: 9
Plan Prescribed Dose Per Fraction: 4 Gy
Plan Prescribed Dose Per Fraction: 4 Gy
Plan Total Fractions Prescribed: 10
Plan Total Fractions Prescribed: 10
Plan Total Prescribed Dose: 40 Gy
Plan Total Prescribed Dose: 40 Gy
Reference Point Dosage Given to Date: 72 Gy
Reference Point Session Dosage Given: 8 Gy
Session Number: 19

## 2023-11-04 NOTE — Telephone Encounter (Signed)
 PC to patient, informed her Dr Buckley has ordered CT head for her to be done around 11/18/23 at [facility].  Her F/U appointment with him is 12/03/23.  She verbalizes understanding & will schedule CT.

## 2023-11-05 ENCOUNTER — Encounter: Payer: Self-pay | Admitting: Hematology and Oncology

## 2023-11-05 ENCOUNTER — Inpatient Hospital Stay

## 2023-11-05 ENCOUNTER — Other Ambulatory Visit: Payer: Self-pay | Admitting: Adult Health

## 2023-11-05 ENCOUNTER — Other Ambulatory Visit: Payer: Self-pay

## 2023-11-05 ENCOUNTER — Ambulatory Visit
Admission: RE | Admit: 2023-11-05 | Discharge: 2023-11-05 | Disposition: A | Discharge status: Home / Self Care | Source: Ambulatory Visit | Attending: Radiation Oncology | Admitting: Radiation Oncology

## 2023-11-05 ENCOUNTER — Telehealth: Payer: Self-pay | Admitting: Hematology and Oncology

## 2023-11-05 DIAGNOSIS — Z510 Encounter for antineoplastic radiation therapy: Secondary | ICD-10-CM | POA: Diagnosis not present

## 2023-11-05 LAB — RAD ONC ARIA SESSION SUMMARY
Course Elapsed Days: 32
Plan Fractions Treated to Date: 10
Plan Fractions Treated to Date: 10
Plan Prescribed Dose Per Fraction: 4 Gy
Plan Prescribed Dose Per Fraction: 4 Gy
Plan Total Fractions Prescribed: 10
Plan Total Fractions Prescribed: 10
Plan Total Prescribed Dose: 40 Gy
Plan Total Prescribed Dose: 40 Gy
Reference Point Dosage Given to Date: 80 Gy
Reference Point Session Dosage Given: 8 Gy
Session Number: 20

## 2023-11-05 MED ORDER — SCOPOLAMINE 1 MG/3DAYS TD PT72: 1.0000 | MEDICATED_PATCH | TRANSDERMAL | 0 refills | Status: AC

## 2023-11-05 NOTE — Progress Notes (Signed)
 Nutrition Assessment  Last seen by RD in 2018   ASSESSMENT:   63 year old female with metastatic breast cancer.  S/p left mastectomy.  Patient completed palliative radiation today to skin.  Currently on xeloda   Met with patient following radiation.  Reports that her appetite is good.  Denies diarrhea. Has bowel movement 2-3 times a day.  Reports phlegm in throat and reflux.  Says that she can't tolerate tomatoes, yogurt, peanut butter because it gets stuck in her throat.  Yesterday ate cereal for breakfast with banana.  Lunch was baked beans which made her have gas.  Ate 1/2 of hamburger and fries last night for dinner which made her have gas.  Wanting to increase protein in her diet.  Does not drink ensure/boost due to phlegm in her throat.      Medications: calcium /vit d, prilosec, lasix , xeloda    Labs: reviewed   Anthropometrics:   Height: 70 inches Weight: 141 lb  154 lb 5/13 BMI: 20  8% weight loss in the last 4 months   Estimated Energy Needs  Kcals: 1600-1920 Protein: 64-77 g Fluid: > 1600 ml   NUTRITION DIAGNOSIS: Unintentional weight loss related to cancer and related treatment side effects as evidenced by 8% weight loss in the last 4 months    INTERVENTION:  Samples of ensure clear given for patient to try.   Discussed foods soft, high in protein foods. Handout provided Encouraged baking soda/salt water rinse. Contact information provided   MONITORING, EVALUATION, GOAL: weight trends, intake   Next Visit: Thursday, Sept 18 phone call  Kathryn Lucas SOLON, CSO, LDN Registered Dietitian [phone]

## 2023-11-05 NOTE — Telephone Encounter (Signed)
 Rescheduled appointment per patients request via incoming call. Talked with the patient and she is aware of the changes made to her upcoming appointment.

## 2023-11-08 ENCOUNTER — Inpatient Hospital Stay: Admitting: Pharmacist

## 2023-11-08 ENCOUNTER — Inpatient Hospital Stay

## 2023-11-08 VITALS — BP 118/86 | HR 112 | Temp 97.8°F | Resp 18 | Ht 70.0 in | Wt 144.3 lb

## 2023-11-08 DIAGNOSIS — Z170 Estrogen receptor positive status [ER+]: Secondary | ICD-10-CM

## 2023-11-08 DIAGNOSIS — C799 Secondary malignant neoplasm of unspecified site: Secondary | ICD-10-CM

## 2023-11-08 DIAGNOSIS — Z510 Encounter for antineoplastic radiation therapy: Secondary | ICD-10-CM | POA: Diagnosis not present

## 2023-11-08 LAB — CBC WITH DIFFERENTIAL (CANCER CENTER ONLY)
Abs Immature Granulocytes: 0.01 K/uL (ref 0.00–0.07)
Basophils Absolute: 0 K/uL (ref 0.0–0.1)
Basophils Relative: 1 %
Eosinophils Absolute: 0.1 K/uL (ref 0.0–0.5)
Eosinophils Relative: 2 %
HCT: 31.1 % — ABNORMAL LOW (ref 36.0–46.0)
Hemoglobin: 10.5 g/dL — ABNORMAL LOW (ref 12.0–15.0)
Immature Granulocytes: 0 %
Lymphocytes Relative: 14 %
Lymphs Abs: 0.4 K/uL — ABNORMAL LOW (ref 0.7–4.0)
MCH: 37.9 pg — ABNORMAL HIGH (ref 26.0–34.0)
MCHC: 33.8 g/dL (ref 30.0–36.0)
MCV: 112.3 fL — ABNORMAL HIGH (ref 80.0–100.0)
Monocytes Absolute: 0.3 K/uL (ref 0.1–1.0)
Monocytes Relative: 11 %
Neutro Abs: 2.2 K/uL (ref 1.7–7.7)
Neutrophils Relative %: 72 %
Platelet Count: 144 K/uL — ABNORMAL LOW (ref 150–400)
RBC: 2.77 MIL/uL — ABNORMAL LOW (ref 3.87–5.11)
RDW: 18.9 % — ABNORMAL HIGH (ref 11.5–15.5)
WBC Count: 3.1 K/uL — ABNORMAL LOW (ref 4.0–10.5)
nRBC: 0 % (ref 0.0–0.2)

## 2023-11-08 LAB — CMP (CANCER CENTER ONLY)
ALT: 11 U/L (ref 0–44)
AST: 38 U/L (ref 15–41)
Albumin: 3.8 g/dL (ref 3.5–5.0)
Alkaline Phosphatase: 151 U/L — ABNORMAL HIGH (ref 38–126)
Anion gap: 6 (ref 5–15)
BUN: 8 mg/dL (ref 8–23)
CO2: 23 mmol/L (ref 22–32)
Calcium: 8.5 mg/dL — ABNORMAL LOW (ref 8.9–10.3)
Chloride: 107 mmol/L (ref 98–111)
Creatinine: 0.48 mg/dL (ref 0.44–1.00)
GFR, Estimated: >60 mL/min (ref >60)
Glucose, Bld: 90 mg/dL (ref 70–99)
Potassium: 4.2 mmol/L (ref 3.5–5.1)
Sodium: 136 mmol/L (ref 135–145)
Total Bilirubin: 0.5 mg/dL (ref 0.0–1.2)
Total Protein: 5.9 g/dL — ABNORMAL LOW (ref 6.5–8.1)

## 2023-11-08 MED ORDER — SODIUM CHLORIDE 0.9% FLUSH
10.0000 mL | Freq: Once | INTRAVENOUS | Status: AC
  Administered 2023-11-08: 10 mL

## 2023-11-08 NOTE — Progress Notes (Signed)
 [facility]       Telephone: [phone]?Fax: [phone]   Oncology Clinical Pharmacist Practitioner Progress Note  Kathryn Lucas was contacted via in-person visit to discuss her chemotherapy regimen for capecitabine  which they receive under the care of Dr. Vinay Gudena.   Current treatment regimen and start date Capecitabine  (09/29/23) 1500 mg BID 14 out of 21 days (11/08/23) 1000 mg BID on days of xrt (10/28/23) -- xrt finished 11/05/23 1500 mg BID 14 out of 21 days (09/29/23)  Interval History She continues on capecitabine  3 tablets (1500 mg) in the morning and 3 tablets (1500 mg) in the evening on days 1 to 14 of a 21-day cycle. This is being given  in combination with denosumab  120 mg (Xgeva ) which is next due on 12/01/23. Therapy is planned to continue until disease progression or unacceptable toxicity. She last saw Dr. Odean on 09/23/23 and had a phone visit with clinical pharmacy on 09/24/23. She saw Kathryn Kendall DNP on 10/22/23. At that time, her capecitabine  was reduced to 2 tabs (1000 mg) PO BID on days of radiation treatment.  Radiation finished on 11/05/23 and she  has restarted the original dose of capecitabine  3 tabs (1500 mg) by mouth every 12 hours for 14 days, followed by a 7 day rest period. Kathryn Lucas as taking capecitabine  incorrectly initially taking 1 tablet three times daily instead of 3 tablets two times daily. She did maintain the 14 out of 21 days. This was discovered by one of the pharmacists on 10/19/23 during a follow up call.  Response to Therapy Doing well. LFTs are trending down, calcium  has improved. She will start capecitabine  back tomorrow 3 tabs (1500 mg) by mouth every 12 hours 14 out of 21 days. She will try simethicone (Gas-X) if experiences bloating, gas when increasing the capecitabine  dose. Labs, vitals, treatment parameters, and manufacturer guidelines assessing toxicity were reviewed with Kathryn Lucas today. Based on these values, patient  is in agreement to continue therapy at this time.  Allergies Allergies  Allergen Reactions   Percocet [Oxycodone -Acetaminophen ] Nausea And Vomiting    Severe GI upset and vomiting    Vitals    11/08/2023    3:27 PM 11/05/2023    1:22 PM 10/22/2023    1:47 PM  Oncology Vitals  Height 178 cm  178 cm  Weight 65.454 kg 63.957 kg 65.137 kg  Weight (lbs) 144 lbs 5 oz 141 lbs 143 lbs 10 oz  BMI 20.7 kg/m2 20.23 kg/m2 20.6 kg/m2  Temp 97.8 F (36.6 C)  98.4 F (36.9 C)  Pulse Rate 112  121  BP 118/86  137/83  Resp 18  18  SpO2 100 %  100 %  BSA (m2) 1.8 m2 1.78 m2 1.79 m2    Laboratory Data    Latest Ref Rng & Units 11/08/2023    2:34 PM 10/22/2023    1:05 PM 09/08/2023   12:48 PM  CBC EXTENDED  WBC 4.0 - 10.5 K/uL 3.1  3.6  1.6   RBC 3.87 - 5.11 MIL/uL 2.77  2.97  2.78   Hemoglobin 12.0 - 15.0 g/dL 89.4  89.0  9.9   HCT 63.9 - 46.0 % 31.1  32.1  28.2   Platelets 150 - 400 K/uL 144  158  161   NEUT# 1.7 - 7.7 K/uL 2.2  2.9  1.0   Lymph# 0.7 - 4.0 K/uL 0.4  0.3  0.3        Latest  Ref Rng & Units 11/08/2023    2:34 PM 10/22/2023    1:05 PM 09/08/2023   12:48 PM  CMP  Glucose 70 - 99 mg/dL 90  891  99   BUN 8 - 23 mg/dL 8  10  10    Creatinine 0.44 - 1.00 mg/dL 9.51  9.42  9.20   Sodium 135 - 145 mmol/L 136  136  136   Potassium 3.5 - 5.1 mmol/L 4.2  4.1  3.9   Chloride 98 - 111 mmol/L 107  107  104   CO2 22 - 32 mmol/L 23  23  25    Calcium  8.9 - 10.3 mg/dL 8.5  8.3  9.5   Total Protein 6.5 - 8.1 g/dL 5.9  6.5  6.5   Total Bilirubin 0.0 - 1.2 mg/dL 0.5  0.8  0.5   Alkaline Phos 38 - 126 U/L 151  164  98   AST 15 - 41 U/L 38  61  85   ALT 0 - 44 U/L 11  14  14      No results found for: MG Lab Results  Component Value Date   CA2729 1,354.4 (H) 10/22/2023     Adverse Effects Assessment LFTs: improving Calcium : improving Hgb: stable Platelets: slightly decreased  Adherence Assessment Kathryn Lucas reports missing 0 doses over the past 4 weeks.   Reason for missed  dose: N/A Patient was re-educated on importance of adherence.   Access Assessment Kathryn Lucas is currently receiving her capecitabine  through [facility] concerns:  none  Medication Reconciliation The patient's medication list was reviewed today with the patient? Yes New medications or herbal supplements have recently been started? No  Any medications have been discontinued? No  The medication list was updated and reconciled based on the patient's most recent medication list in the electronic medical record (EMR) including herbal products and OTC medications.   Medications Current Outpatient Medications  Medication Sig Dispense Refill   capecitabine  (XELODA ) 500 MG tablet Take 2 tablets twice a day Monday through Friday during radiation (Patient taking differently: Take 3 tablets (1500 mg) by mouth every 12 hours for 14 days, then off 7 days. Take with food. Start 11/09/23)     Calcium  500-100 MG-UNIT CHEW Chew 1 tablet by mouth daily. 60 tablet    cholecalciferol (VITAMIN D3) 25 MCG (1000 UNIT) tablet Take 1 tablet (1,000 Units total) by mouth daily.     furosemide  (LASIX ) 20 MG tablet Take 1 tablet (20 mg total) by mouth daily. If needed may take 1/2 tab q am (Patient not taking: Reported on 10/22/2023) 30 tablet 0   omeprazole  (PRILOSEC) 20 MG capsule TAKE 1 CAPSULE BY MOUTH ONCE DAILY 30 capsule 3   scopolamine  (TRANSDERM-SCOP) 1 MG/3DAYS Place 1 patch (1 mg total) onto the skin every 3 (three) days. 10 patch 0   SYNTHROID  125 MCG tablet TAKE 1 TABLET BY MOUTH ONCE DAILY BEFOREBREAKFAST 90 tablet 0   vitamin C  (ASCORBIC ACID) 250 MG tablet Take 1 tablet (250 mg total) by mouth daily.     Zinc  Acetate, Oral, (ZINC  ACETATE PO) Take by mouth.     zonisamide  (ZONEGRAN ) 100 MG capsule Take 1 capsule (100 mg total) by mouth daily. 30 capsule 1   No current facility-administered medications for this visit.    Drug-Drug Interactions (DDIs) DDIs were  evaluated? Yes Significant DDIs? No  The patient was instructed to speak with their health care provider and/or the oral  chemotherapy pharmacist before starting any new drug, including prescription or over the counter, natural / herbal products, or vitamins.  Supportive Care Will use Gas-X for bloating Will monitor HFS closely now that she is going back up on dose to where she started initially. Was reduced while on XRT  Dosing Assessment Hepatic adjustments needed? No  Renal adjustments needed? No  Toxicity adjustments needed? No  The current dosing regimen is appropriate to continue at this time.  Follow-Up Plan Continue capecitabine  3 tablets (1500 mg) by mouth every 12 hours for 14 days, followed by a 7-day rest period. Reeducated on instructions. Given calendar and shown how to fill out for each cycle. Updated Rx. Continue denosumab  120 mg SubQ every 12 weeks. Next due 12/01/23, last given 09/08/23 Monitor for side effects Phone call with Dr. Gudena on 11/23/23 to review scans Will add Dr. Odean visit on 12/01/23 and add [employer] with labs and denosumab  injection appt for 12/01/23 Kathryn Lucas can follow up with clinical pharmacy as deemed necessary by Dr. Mackey Odean going forward   Kathryn Lucas participated in the discussion, expressed understanding, and voiced agreement with the above plan. All questions were answered to her satisfaction. The patient was advised to contact the clinic at [phone] with any questions or concerns prior to her return visit.   I spent 30 minutes assessing and educating the patient.  Rithvik Orcutt A. Lucas, PharmD, BCOP, CPP  Kathryn Lucas, RPH-CPP, 11/08/2023  3:34 PM   **Disclaimer: This note was dictated with voice recognition software. Similar sounding words can inadvertently be transcribed and this note may contain transcription errors which may not have been corrected upon publication of note.**

## 2023-11-08 NOTE — Radiation Completion Notes (Signed)
 Patient Name: Kathryn Lucas, Kathryn Lucas MRN: [id number] Date of Birth: 05-12-1960 Referring Physician: VELNA SKEETER, M.D. Date of Service: 2023-11-08 Radiation Oncologist: Marcey Penton, M.D.  [facility] [place]                             RADIATION ONCOLOGY END OF TREATMENT NOTE     Diagnosis: C79.51 Secondary malignant neoplasm of bone Staging on 2017-11-02: Malignant neoplasm of lower-outer quadrant of left breast of female, estrogen receptor positive (HCC) T=pT3, N=pN1a, M=cM0 Staging on 2017-07-13: Malignant neoplasm of lower-outer quadrant of left breast of female, estrogen receptor positive (HCC) T=cT3, N=cN0, M=cM0 Intent: Curative     HPI: Patient is a 63 year old female now out close to year have received radiation therapy to her cervical spine for metastatic breast cancer..  She is recently presented with sudden onset of speech arrest as well as seizure activity for which she has been put on Keppra  500 mg twice a day.  CT scan showed extensive predominantly stable areas of osseous metastatic disease throughout the calvarium.  There is also a new area of enhancing soft tissue attenuation along the lateral aspect of the right globe.  Patient also has 2 large areas of metastatic disease involving for her upper back.  Recent CT angio of the head and neck showed no evidence of large vessel occlusion.  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  She has been seen by Dr. Buckley as well as medical oncology and is now referred for consideration of palliative treatment.  She also has other skeletal metastatic disease as well as pathologic fracture of C3 vertebral body which was treated last year and is stable.  She is having no significant problems with vision or other cranial nerves.      ==========DELIVERED PLANS==========  First  Treatment Date: 2023-10-04 Last Treatment Date: 2023-11-05   Plan Name: HN_Skull Site: Cranium Technique: IMRT Mode: Photon Dose Per Fraction: 3 Gy Prescribed Dose (Delivered / Prescribed): 30 Gy / 30 Gy Prescribed Fxs (Delivered / Prescribed): 10 / 10   Plan Name: Chest_L_Lat Site: Thorax Technique: Electron Mode: Electron Dose Per Fraction: 4 Gy Prescribed Dose (Delivered / Prescribed): 40 Gy / 40 Gy Prescribed Fxs (Delivered / Prescribed): 10 / 10   Plan Name: Chest_L_Med Site: Thorax Technique: Electron Mode: Electron Dose Per Fraction: 4 Gy Prescribed Dose (Delivered / Prescribed): 40 Gy / 40 Gy Prescribed Fxs (Delivered / Prescribed): 10 / 10     ==========ON TREATMENT VISIT DATES========== 2023-10-05, 2023-10-12, 2023-10-26, 2023-11-02     ==========UPCOMING VISITS========== 12/06/2023 [facility] RAD ONCOLOGY FOLLOW UP 30 Penton Marcey, MD  12/03/2023 [facility] MED ONC EST PT 30 Buckley Arthea POUR, MD  12/02/2023 [facility] ONC NUT 45 Dasie Simple, RD  12/01/2023 [facility] INJECTION [facility]  12/01/2023 [facility] EST PT 15 Odean Potts, MD  12/01/2023 [facility] PORT FLUSH W/LAB [facility] FLUSH  11/26/2023 [facility] NEW PATIENT [address] [place], [place]  11/23/2023 [facility] TELEPHONE OFFICE VISIT Odean Potts, MD  11/17/2023 [facility] IM[facility]ING CT CHEST ABD/PELVIS W CONTRAST [facility]  11/17/2023 [facility] IM[facility] CT HEAD W & WO CONTRAST [facility]  11/08/2023 [facility] PHARMACY CLINIC Lucila Norleen LABOR, RPH-CPP  11/08/2023 [facility] PORT FLUSH W/LAB [facility]        ==========APPENDIX - ON TREATMENT VISIT NOTES==========  See weekly On Treatment Notes in Epic for details in the Media tab (listed as Progress notes on the On Treatment Visit Dates listed above).

## 2023-11-09 ENCOUNTER — Encounter: Payer: Self-pay | Admitting: Hematology and Oncology

## 2023-11-09 LAB — CANCER ANTIGEN 27.29: CA 27.29: 656.4 U/mL — ABNORMAL HIGH (ref 0.0–38.6)

## 2023-11-09 LAB — TSH: TSH: 0.348 u[IU]/mL — ABNORMAL LOW (ref 0.350–4.500)

## 2023-11-10 ENCOUNTER — Ambulatory Visit: Admitting: Hematology and Oncology

## 2023-11-10 ENCOUNTER — Other Ambulatory Visit: Payer: Self-pay | Admitting: *Deleted

## 2023-11-10 DIAGNOSIS — C7951 Secondary malignant neoplasm of bone: Secondary | ICD-10-CM

## 2023-11-15 ENCOUNTER — Encounter: Payer: Self-pay | Admitting: Hematology and Oncology

## 2023-11-16 ENCOUNTER — Other Ambulatory Visit: Payer: Self-pay

## 2023-11-16 ENCOUNTER — Ambulatory Visit: Admitting: Hematology and Oncology

## 2023-11-16 ENCOUNTER — Other Ambulatory Visit: Payer: Self-pay | Admitting: Pharmacist

## 2023-11-16 MED ORDER — CAPECITABINE 500 MG PO TABS
1500.0000 mg | ORAL_TABLET | Freq: Two times a day (BID) | ORAL | 6 refills | Status: AC
  Filled 2023-11-16: qty 84, 14d supply, fill #0
  Filled 2023-11-16: qty 84, 21d supply, fill #0
  Filled 2023-12-09: qty 84, 21d supply, fill #1
  Filled 2023-12-28: qty 84, 21d supply, fill #2
  Filled 2024-01-12 – 2024-01-13 (×2): qty 84, 21d supply, fill #3
  Filled 2024-02-09 – 2024-02-11 (×2): qty 84, 21d supply, fill #4
  Filled 2024-02-29: qty 84, 21d supply, fill #5
  Filled 2024-03-20: qty 84, 21d supply, fill #6

## 2023-11-17 ENCOUNTER — Ambulatory Visit: Admission: RE | Admit: 2023-11-17 | Source: Ambulatory Visit

## 2023-11-17 ENCOUNTER — Ambulatory Visit
Admission: RE | Admit: 2023-11-17 | Discharge: 2023-11-17 | Disposition: A | Discharge status: Home / Self Care | Source: Ambulatory Visit | Attending: Internal Medicine | Admitting: Internal Medicine

## 2023-11-17 ENCOUNTER — Other Ambulatory Visit: Payer: Self-pay

## 2023-11-17 DIAGNOSIS — C7951 Secondary malignant neoplasm of bone: Secondary | ICD-10-CM | POA: Diagnosis present

## 2023-11-17 DIAGNOSIS — C50512 Malignant neoplasm of lower-outer quadrant of left female breast: Secondary | ICD-10-CM | POA: Insufficient documentation

## 2023-11-17 DIAGNOSIS — R569 Unspecified convulsions: Secondary | ICD-10-CM | POA: Diagnosis present

## 2023-11-17 DIAGNOSIS — Z170 Estrogen receptor positive status [ER+]: Secondary | ICD-10-CM | POA: Insufficient documentation

## 2023-11-17 MED ORDER — HEPARIN SOD (PORK) LOCK FLUSH 100 UNIT/ML IV SOLN
500.0000 [IU] | Freq: Once | INTRAVENOUS | Status: AC
  Administered 2023-11-17: 500 [IU] via INTRAVENOUS

## 2023-11-17 MED ORDER — IOHEXOL 300 MG/ML  SOLN
80.0000 mL | Freq: Once | INTRAMUSCULAR | Status: AC | PRN
  Administered 2023-11-17: 80 mL via INTRAVENOUS

## 2023-11-17 NOTE — Progress Notes (Signed)
 Specialty Pharmacy Refill Coordination Note  Kathryn Lucas is a 63 y.o. female contacted today regarding refills of specialty medication(s) Capecitabine  (XELODA )   Patient requested Delivery   Delivery date: 11/24/23   Verified address: [address], [place], [address]   Medication will be filled on 11/23/23.

## 2023-11-18 ENCOUNTER — Encounter: Payer: Self-pay | Admitting: Internal Medicine

## 2023-11-22 ENCOUNTER — Inpatient Hospital Stay: Attending: Hematology and Oncology | Admitting: Internal Medicine

## 2023-11-22 ENCOUNTER — Ambulatory Visit: Admitting: Internal Medicine

## 2023-11-22 DIAGNOSIS — C50512 Malignant neoplasm of lower-outer quadrant of left female breast: Secondary | ICD-10-CM | POA: Insufficient documentation

## 2023-11-22 DIAGNOSIS — R569 Unspecified convulsions: Secondary | ICD-10-CM | POA: Insufficient documentation

## 2023-11-22 DIAGNOSIS — G629 Polyneuropathy, unspecified: Secondary | ICD-10-CM | POA: Insufficient documentation

## 2023-11-22 DIAGNOSIS — Z923 Personal history of irradiation: Secondary | ICD-10-CM | POA: Insufficient documentation

## 2023-11-22 DIAGNOSIS — Z79811 Long term (current) use of aromatase inhibitors: Secondary | ICD-10-CM | POA: Insufficient documentation

## 2023-11-22 DIAGNOSIS — Z9221 Personal history of antineoplastic chemotherapy: Secondary | ICD-10-CM | POA: Insufficient documentation

## 2023-11-22 DIAGNOSIS — C7951 Secondary malignant neoplasm of bone: Secondary | ICD-10-CM | POA: Insufficient documentation

## 2023-11-22 DIAGNOSIS — Z79899 Other long term (current) drug therapy: Secondary | ICD-10-CM | POA: Insufficient documentation

## 2023-11-22 DIAGNOSIS — Z17411 Hormone receptor positive with human epidermal growth factor receptor 2 negative status: Secondary | ICD-10-CM | POA: Insufficient documentation

## 2023-11-22 DIAGNOSIS — Z9012 Acquired absence of left breast and nipple: Secondary | ICD-10-CM | POA: Insufficient documentation

## 2023-11-22 MED ORDER — LAMOTRIGINE 25 MG PO TABS: 25.0000 mg | ORAL_TABLET | Freq: Every day | ORAL | 2 refills | Status: DC

## 2023-11-22 NOTE — Progress Notes (Signed)
 I connected with Kathryn Lucas on 11/22/23 at 10:30 AM EDT by telephone visit and verified that I am speaking with the correct person using two identifiers.  I discussed the limitations, risks, security and privacy concerns of performing an evaluation and management service by telemedicine and the availability of in-person appointments. I also discussed with the patient that there may be a patient responsible charge related to this service. The patient expressed understanding and agreed to proceed.  Other persons participating in the visit and their role in the encounter:  n/a  Patient's location:  Home Provider's location:  Office Chief Complaint:  Focal seizure (HCC)  History of Present Ilness: Kathryn Lucas again reports no further seizures, now having completed skull radiation a [place] as of early August.  Unfortunately she is no longer tolerating the Zonisamide , with marked nasuea and some vomiting.  This resolved when she stopped the drug last week.  No other new or progressive changes.  Observations: Language and cognition at baseline.    Imaging:  [facility] Clinician Interpretation: I have personally reviewed the CNS images as listed.  My interpretation, in the context of the patient's clinical presentation, is stable disease  CT CHEST ABDOMEN PELVIS W CONTRAST Result Date: 11/19/2023 CLINICAL DATA:  Breast cancer, invasive stage IV, assess treatment response. * Tracking Code: BO * EXAM: CT CHEST, ABDOMEN, AND PELVIS WITH CONTRAST TECHNIQUE: Multidetector CT imaging of the chest, abdomen and pelvis was performed following the standard protocol during bolus administration of intravenous contrast. RADIATION DOSE REDUCTION: This exam was performed according to the departmental dose-optimization program which includes automated exposure control, adjustment of the mA and/or kV according to patient size and/or use of iterative reconstruction technique. CONTRAST:  80mL OMNIPAQUE  IOHEXOL  300 MG/ML   SOLN COMPARISON:  PET-CT September 15, 2023 and CT May 11, 2023 FINDINGS: CT CHEST FINDINGS Cardiovascular: Accessed right chest Port-A-Cath with tip in the right atrium. Normal caliber thoracic aorta. Normal size heart. Trace pericardial effusion. Mediastinum/Nodes: No suspicious thyroid  nodule. Right axillary lymph node which was hypermetabolic on prior PET-CT measures 7 mm in short axis on image 17/8 previously 6 mm. Surgical clips in the left axilla. Gas fluid levels in a patulous esophagus. Lungs/Pleura: Biapical pleuroparenchymal scarring. Scattered atelectasis/scarring. Similar scarring in the perihilar right upper and lower lobes. No suspicious pulmonary nodules or masses. Subpleural reticulations in the anterior left lung likely reflects sequela prior radiation therapy. Musculoskeletal: Subcutaneous/cutaneous nodular soft tissue thickening in the posterior upper back on image 7/8 which was hypermetabolic on prior PET-CT is similar to that examination. Extensive osseous metastatic disease involving the axial and proximal appendicular skeleton with a few areas showing increased sclerosis for instance increased sclerosis and expansion in the sternum on image 89/12 and increased sclerosis involving and anterior T4 vertebral body lesion on image 87/2. Superior endplate deformity at T12 wedging deformity at T10 in wedging deformity at T6 are similar to prior CT. CT ABDOMEN PELVIS FINDINGS Hepatobiliary: Bilobar hepatic metastasis similar recent PET-CT progressed from CT May 11, 2023. For reference: -Ill-defined hypodensity the posterolateral left lobe of the liver measuring 10 mm on image 57/8 -ill-defined hypodensity the anterior liver along the interlobar fissure measuring 12 mm on image 53/8 previously 10 mm Gallbladder is unremarkable.  No biliary ductal dilation. Pancreas: No pancreatic ductal dilation or evidence of acute inflammation. Spleen: No splenomegaly. Adrenals/Urinary Tract: No suspicious  adrenal nodule/mass. Kidneys demonstrate symmetric enhancement. No hydronephrosis. Urinary bladder is unremarkable for degree of distension. Stomach/Bowel: Stomach is unremarkable for  degree of distension. No pathologic dilation of small or large bowel. Colonic stool burden compatible with constipation. Vascular/Lymphatic: Normal caliber abdominal aorta. Smooth IVC contours. Indexed right external iliac lymph node measures 6 mm in short axis on image 105/8 previously 7 mm. Reproductive: No suspicious or acute finding. Other: No significant abdominopelvic free fluid. Musculoskeletal: No significant interval change in the mixed lytic and sclerotic osseous metastatic disease throughout the axial and proximal appendicular skeleton. Previously indexed lesion in the right iliac wing measures 3.9 x 2.8 cm on image 92/8 previously 3.4 x 3.1 cm when remeasured for consistency. IMPRESSION: 1. Bilobar hepatic metastasis are similar to recent PET-CT but progressed from CT May 11, 2023. 2. Extensive osseous metastatic disease involving the axial and proximal appendicular skeleton with a few areas showing increased sclerosis which may reflect treatment response but is nonspecific. Suggest attention on follow-up imaging. 3. Stable right axillary and right external iliac lymph nodes. 4. Subcutaneous/cutaneous nodular soft tissue thickening in the posterior upper back which was hypermetabolic on prior PET-CT is similar to that examination, compatible with disease involvement. 5. Gas fluid levels in a patulous esophagus, suggestive of gastroesophageal reflux. 6. Colonic stool burden compatible with constipation. Electronically Signed   By: Reyes Holder M.D.   On: 11/19/2023 16:12   CT HEAD W & WO CONTRAST ( ) Result Date: 11/17/2023 EXAM: CT HEAD WITHOUT AND WITH CONTRAST 11/17/2023 02:21:09 PM TECHNIQUE: CT of the head was performed without and with the administration of 80mL iohexol  (OMNIPAQUE ) 300 MG/ML solution.  Automated exposure control, iterative reconstruction, and/or weight based adjustment of the mA/kV was utilized to reduce the radiation dose to as low as reasonably achievable. COMPARISON: CT head and CTA head and neck 08/20/2023 CLINICAL HISTORY: Brain/CNS neoplasm, monitor. Restaging Left Breast CA diagnosed in 2019 and she had a Mastectomy with Chemo and rad tx's. 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 Finished chemo tx's every three weeks for the bone mets. Mass behind eye and had radiation; She is currently asymptomatic. NKI Hx Port-a-cath. Former smoker, quit greater than 40 years ago. FINDINGS: BRAIN AND VENTRICLES: No acute intracranial hemorrhage. No mass effect or midline shift. No extra-axial fluid collection. No evidence of acute infarct. No hydrocephalus. No abnormal enhancement. ORBITS: The previously noted soft tissue along the lateral aspect of the right globe may reflect mild prominence of the lacrimal gland. This region of soft tissue is decreased in prominence on the current study. The orbits are otherwise unremarkable. SINUSES AND MASTOIDS: No acute abnormality. SOFT TISSUES AND SKULL: The previously noted areas of soft tissue throughout the scalp are decreased in size and number compared to prior. Heterogeneous lesion involving the right frontal calvarium is similar to prior. There are additional scattered areas of lytic lesions throughout the visualized calvarium which are similar to prior. IMPRESSION: 1. No acute intracranial abnormality. 2. 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 3. Stable heterogeneous lesion involving the right frontal calvarium and additional scattered lytic lesions throughout the visualized calvarium. 4. Decreased soft tissue along the lateral aspect of the right globe, possibly representing mild prominence of the lacrimal gland. The orbits are otherwise unremarkable.  Electronically signed by: Donnice Mania MD 11/17/2023 03:02 PM EDT RP Workstation: [id number]   Assessment and Plan: Focal seizure (HCC)  Clinically stable, but has now not tolerated Keppra , Vimpat , Zonisamide .  No recent seizures, and her pachymeningeal enhancement is resolved following skull radiation with Dr. Lenn.  She is agreeable with trial of Lamictal , but only at a very low dose.  We feel this is reasonable.  Will dose just 25mg  daily so that some prevention is afforded with minimal disruption to quality of life.  She knows to call us  if she develops any rash.  Follow Up Instructions:  We ask that Kathryn Lucas return to clinic later this month as scheduled  I discussed the assessment and treatment plan with the patient.  The patient was provided an opportunity to ask questions and all were answered.  The patient agreed with the plan and demonstrated understanding of the instructions.    The patient was advised to call back or seek an in-person evaluation if the symptoms worsen or if the condition fails to improve as anticipated.    Aiden Rao K Kelseigh Diver, MD   I provided 20 minutes of non face-to-face telephone visit time during this encounter, and > 50% was spent counseling as documented under my assessment & plan.

## 2023-11-23 ENCOUNTER — Inpatient Hospital Stay (HOSPITAL_BASED_OUTPATIENT_CLINIC_OR_DEPARTMENT_OTHER): Admitting: Hematology and Oncology

## 2023-11-23 ENCOUNTER — Other Ambulatory Visit: Payer: Self-pay | Admitting: Internal Medicine

## 2023-11-23 DIAGNOSIS — Z170 Estrogen receptor positive status [ER+]: Secondary | ICD-10-CM

## 2023-11-23 DIAGNOSIS — C50512 Malignant neoplasm of lower-outer quadrant of left female breast: Secondary | ICD-10-CM | POA: Diagnosis not present

## 2023-11-23 MED ORDER — LEVOTHYROXINE SODIUM 100 MCG PO TABS: 100.0000 ug | ORAL_TABLET | Freq: Every day | ORAL | 1 refills | Status: AC

## 2023-11-23 NOTE — Progress Notes (Signed)
 HEMATOLOGY-ONCOLOGY TELEPHONE VISIT PROGRESS NOTE  I connected with our patient on 11/23/23 at  1:30 PM EDT by telephone and verified that I am speaking with the correct person using two identifiers.  I discussed the limitations, risks, security and privacy concerns of performing an evaluation and management service by telephone and the availability of in person appointments.  I also discussed with the patient that there may be a patient responsible charge related to this service. The patient expressed understanding and agreed to proceed.   History of Present Illness: Follow-up to discuss results of CT scans  History of Present Illness Kathryn Lucas is a 63 year old female with metastatic cancer who presents for follow-up on her recent CT scans and lab results.  Her recent CT scans show stable metastatic lesions in the liver, bones, lymph nodes, and soft tissue thickening in the back, with resolution of skull lesions. The cancer marker CA 27-29 has decreased from 1354 to 656, and liver enzymes have improved. She is experiencing a persistent issue with her right arm and plans to attend physical therapy. Hair loss is present, and she is receiving wound care for her scalp and back. She expresses concern about small tumors around her liver.    Oncology History  Malignant neoplasm of lower-outer quadrant of left breast of female, estrogen receptor positive (HCC)  06/11/2016 Mammogram   Palpable left breast masses 3:00 position: 2.2 cm; 5:30 position: 2.5 cm; 6:30 position: 0.7 cm   06/19/2016 Initial Diagnosis   Left breast biopsy 3:30: IDC with DCIS grade 1, ER 90%, PR 50%, Ki-67 15%, HER-2 negative ratio 1.13; biopsy 5:30 position: IDC grade 1   07/13/2016 Breast MRI   Large area of abnormal enhancement lower inner and lower outer quadrants left breast spanning 9 cm x 6.4 cm x 5.3 cm, no abnormal enlarged lymph nodes; T3 N0 stage II a (New AJCC staging)    07/15/2016 - 12/08/2017 Anti-estrogen oral  therapy   Neoadjuvant anastrozole  1 mg daily   07/17/2016 Oncotype testing   Testing done on the biopsy: Oncotype DX score 22, intermediate risk   02/02/2017 Breast MRI   Left breast multicentric disease unchanged measuring 2.7 x 1.6 cm.  Mass in the non-mass enhancement are also not significantly changed measuring 6.2 x 2.4 cm. new enhancing mass within the outer right breast 7 mm which could be fat necrosis or inclusion cyst    02/09/2017 Imaging   Ultrasound of the right breast lesion noted on MRI: No sonographic finding corresponds to the abnormality noted on MRI   07/13/2017 Cancer Staging   Staging form: Breast, AJCC 8th Edition - Clinical stage from 07/13/2017: Stage IIA (cT3, cN0, cM0, G1, ER+, PR+, HER2-) - Signed by Crawford Morna Pickle, NP on 05/18/2018   10/26/2017 Surgery   Left mastectomy: IDC grade 1, 2 foci largest spans 8.5 cm, intermediate grade DCIS, lymphovascular invasion identified, perineural invasion identified, 1/2 lymph nodes positive with extracapsular extension, ER 9200%, PR 5 to 50%, HER-2 negative, Ki-67 10 to 15%, T3N1A Mammaprint: low risk   11/02/2017 Cancer Staging   Staging form: Breast, AJCC 8th Edition - Pathologic: No Stage Recommended (ypT3, pN1a, cM0, G1, ER+, PR+, HER2-) - Signed by Odean Potts, MD on 11/02/2017   12/08/2017 - 01/26/2018 Radiation Therapy   Adjuvant radiation therapy    02/2018 -  Anti-estrogen oral therapy   Anastrozole  1 mg daily adjuvant therapy   10/21/2022 - 07/06/2023 Chemotherapy   Patient is on Treatment Plan : BREAST  METASTATIC Fam-Trastuzumab Deruxtecan-nxki  (Enhertu ) (5.4) q21d       REVIEW OF SYSTEMS:   Constitutional: Denies fevers, chills or abnormal weight loss All other systems were reviewed with the patient and are negative. Observations/Objective:     Assessment Plan:  Malignant neoplasm of lower-outer quadrant of left breast of female, estrogen receptor positive (HCC) 10/26/17: Left mastectomy: IDC grade  1, 2 foci largest spans 8.5 cm, intermediate grade DCIS, lymphovascular invasion identified, perineural invasion identified, 1/2 lymph nodes positive with extracapsular extension, ER 9200%, PR 5 to 50%, HER-2 negative, Ki-67 10 to 15%, T3N1A   Oncotype DX score 22, intermediate risk, chemotherapy not felt to have significant benefit.   Treatment Summary: 1. Antiestrogen therapy with anastrozole  1 mg daily started 07/15/2016 2. Mastectomy 10/26/2017, Mammaprint low risk luminal type A 3. Followed by adjuvant radiation 12/08/17- 01/26/18  4. Followed by adjuvant antiestrogen therapy anastrozole  started 01/17/2018 (originally started 07/15/2016) 5.  August 2023: Low back pain: Large lesion in the sacrum biopsy of sacrum: 12/26/2021: Metastatic breast cancer, ER 90%, PR 10%, HER2 negative (0) 6. Ibrance  along with Faslodex  started 12/25/2021-10/14/2022 7. Palliative radiation to the sacrum completed 01/19/2022 8.  Enhertu  started 10/21/2022-07/06/2023 9.  Skin biopsy: 06/14/2023: Poorly differentiated adenocarcinoma x 2 biopsies: ER 99%, PR 2%, HER2 1+, Ki-67 25%, Caris molecular testing: HER2 low, hormone receptor positive, ESR 1 mutation (no PIK3CA, no PALB2, no PD-L1, TMB 4) 10. 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  Will continue with the current treatment. Marked Improvement in the CA 27-29 Low TSH: I will reduce her dose of Synthroid  to 100 mcg daily. Assessment & Plan Metastatic breast cancer with stable disease involving liver, bone, axillary and intra-abdominal lymph nodes, and soft tissue Metastatic breast cancer stable with no change in lesion size. CA 27-29 decreased from 1354 to 656. Liver enzymes improved. Skull lesions resolved. - - Consider future PET scan to differentiate active disease from scar tissue.  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   - Reduce Synthroid  dose from 125 mcg to 100 mcg. - Send updated prescription to [facility] pharmacy in [place].      I discussed the assessment and treatment plan with the patient. The patient was provided an opportunity to ask questions and all were answered. The patient agreed with the plan and demonstrated an understanding of the instructions. The patient was advised to call back or seek an in-person evaluation if the symptoms worsen or if the condition fails to improve as anticipated.   I provided 20 minutes of non-face-to-face time during this encounter.  This includes time for charting and coordination of care   Naomi MARLA Chad, MD

## 2023-11-23 NOTE — Assessment & Plan Note (Signed)
 10/26/17: Left mastectomy: IDC grade 1, 2 foci largest spans 8.5 cm, intermediate grade DCIS, lymphovascular invasion identified, perineural invasion identified, 1/2 lymph nodes positive with extracapsular extension, ER 9200%, PR 5 to 50%, HER-2 negative, Ki-67 10 to 15%, T3N1A   Oncotype DX score 22, intermediate risk, chemotherapy not felt to have significant benefit.   Treatment Summary: 1. Antiestrogen therapy with anastrozole  1 mg daily started 07/15/2016 2. Mastectomy 10/26/2017, Mammaprint low risk luminal type A 3. Followed by adjuvant radiation 12/08/17- 01/26/18  4. Followed by adjuvant antiestrogen therapy anastrozole  started 01/17/2018 (originally started 07/15/2016) 5.  August 2023: Low back pain: Large lesion in the sacrum biopsy of sacrum: 12/26/2021: Metastatic breast cancer, ER 90%, PR 10%, HER2 negative (0) 6. Ibrance  along with Faslodex  started 12/25/2021-10/14/2022 7. Palliative radiation to the sacrum completed 01/19/2022 8.  Enhertu  started 10/21/2022-07/06/2023 9.  Skin biopsy: 06/14/2023: Poorly differentiated adenocarcinoma x 2 biopsies: ER 99%, PR 2%, HER2 1+, Ki-67 25%, Caris molecular testing: HER2 low, hormone receptor positive, ESR 1 mutation (no PIK3CA, no PALB2, no PD-L1, TMB 4) 10. 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  Will continue with the current treatment.

## 2023-11-24 ENCOUNTER — Ambulatory Visit: Attending: Hematology and Oncology | Admitting: Physical Therapy

## 2023-11-24 DIAGNOSIS — C7951 Secondary malignant neoplasm of bone: Secondary | ICD-10-CM | POA: Diagnosis present

## 2023-11-24 DIAGNOSIS — M542 Cervicalgia: Secondary | ICD-10-CM | POA: Insufficient documentation

## 2023-11-24 DIAGNOSIS — M6281 Muscle weakness (generalized): Secondary | ICD-10-CM | POA: Diagnosis present

## 2023-11-24 DIAGNOSIS — R29898 Other symptoms and signs involving the musculoskeletal system: Secondary | ICD-10-CM | POA: Insufficient documentation

## 2023-11-24 DIAGNOSIS — M25611 Stiffness of right shoulder, not elsewhere classified: Secondary | ICD-10-CM | POA: Diagnosis present

## 2023-11-24 DIAGNOSIS — M25612 Stiffness of left shoulder, not elsewhere classified: Secondary | ICD-10-CM | POA: Insufficient documentation

## 2023-11-24 DIAGNOSIS — M436 Torticollis: Secondary | ICD-10-CM | POA: Insufficient documentation

## 2023-11-24 DIAGNOSIS — R262 Difficulty in walking, not elsewhere classified: Secondary | ICD-10-CM | POA: Diagnosis present

## 2023-11-24 NOTE — Therapy (Signed)
 OUTPATIENT PHYSICAL Evaluation    Patient Name: Kathryn Lucas MRN: [id number] DOB:1960-10-26, 63 y.o., female Today's Date: 11/24/2023   PCP: Vernon Velna SAUNDERS, MD  REFERRING PROVIDER: Odean Potts, MD   END OF SESSION:  PT End of Session - 11/24/23 1618     Visit Number 1    Number of Visits 16    Date for PT Re-Evaluation 01/19/24    PT Start Time 1618    PT Stop Time 1700    PT Time Calculation (min) 42 min    Equipment Utilized During Treatment Gait belt    Activity Tolerance Patient tolerated treatment well    Behavior During Therapy WFL for tasks assessed/performed          Past Medical History:  Diagnosis Date   Breast cancer (HCC)    left breast cancer   Cancer (HCC) 09/2017   left breast cancer   Family history of breast cancer    Hypothyroidism    Personal history of radiation therapy 2019   Thyroid  disease    Past Surgical History:  Procedure Laterality Date   BREAST BIOPSY Left 2018   BREAST BIOPSY Left 2019   BREAST RECONSTRUCTION WITH PLACEMENT OF TISSUE EXPANDER AND ALLODERM Left 10/26/2017   Procedure: LEFT BREAST RECONSTRUCTION WITH PLACEMENT OF TISSUE EXPANDER AND ALLODERM;  Surgeon: Arelia Filippo, MD;  Location: [facility];  Service: Plastics;  Laterality: Left;   IR IMAGING GUIDED PORT INSERTION  04/27/2023   MASTECTOMY Left 2019   MASTECTOMY WITH RADIOACTIVE SEED GUIDED EXCISION AND AXILLARY SENTINEL LYMPH NODE BIOPSY Left 10/26/2017   Procedure: LEFT MASTECTOMY WITH SEED TARGETED  LEFT AXILLARY LYMPH NODE EXCISION AND LEFT SENTINEL LYMPH NODE BIOPSY;  Surgeon: Aron Shoulders, MD;  Location: [facility];  Service: General;  Laterality: Left;   TONSILLECTOMY     WISDOM TOOTH EXTRACTION     Patient Active Problem List   Diagnosis Date Noted   Focal seizure (HCC) 09/02/2023   Metastatic 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) 10/14/2022   Elevated liver enzymes 10/14/2022   Unexplained weight loss 10/14/2022   Neoplasm  related pain 10/14/2022   Metastatic malignant neoplasm (HCC) 12/19/2021   Family history of breast cancer    History of therapeutic radiation 04/27/2018   Breast cancer, left breast (HCC) 10/26/2017   Malignant neoplasm of lower-outer quadrant of left breast of female, estrogen receptor positive (HCC) 07/15/2016   Plantar fasciitis 07/30/2015   Metatarsalgia 10/18/2014    ONSET DATE: September 2023   REFERRING DIAG:  Diagnosis  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  Rationale for Evaluation and Treatment: Rehabilitation  SUBJECTIVE:  SUBJECTIVE STATEMENT:  Pt arrives at PT after prolonged hiatus from PT due to progress reports at that time. Was able to receive PT from [facility] clinic for a few months. reports that she  had restarted CA treatment via radiation to address metastases of skull and Spine. Completed radiation therapy  in September. Scans show improvement in cerebral metastases .   Pt reports that she feels like her legs and arms are weaker now after completing recent Radiation treatment. Wants to be able to stand in one place for increased time.   Pt accompanied by: none.   PERTINENT HISTORY:   Kathryn Lucas is a 63 year old female with metastatic cancer who presents for follow-up on her recent CT scans and lab results.   Her recent CT scans show stable metastatic lesions in the liver, bones, lymph nodes, and soft tissue thickening in the back, with resolution of skull lesions.   Pt had exacerbation of s/s in June of 2024 with hospitalization and subsequent change in oncology treatment limiting ability to attend PT. Pt reports recent decline again in function following radiation in summer 2025.   PAIN:  Are you having  pain? No  PRECAUTIONS: Cervical and Other: cervical for comfort   RED FLAGS: None   WEIGHT BEARING RESTRICTIONS: No  FALLS: Has patient fallen in last 6 months? Yes. Number of falls 1  LIVING ENVIRONMENT: Lives with: lives alone Lives in: House/apartment Stairs: yes, but uses ramp  Has following equipment at home: Walker - 4 wheeled  PLOF: Needs assistance with ADLs and Needs assistance with homemaking  PATIENT GOALS: improve balance, strength and endurance. Stand at kitchen longer and   OBJECTIVE:   DIAGNOSTIC FINDINGS:  11/17/2023  CT head IMPRESSION: 1. No acute intracranial abnormality. 2. 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 3. Stable heterogeneous lesion involving the right frontal calvarium and additional scattered lytic lesions throughout the visualized calvarium. 4. Decreased soft tissue along the lateral aspect of the right globe, possibly representing mild prominence of the lacrimal gland. The orbits are otherwise unremarkable.  CT chest/abdomin  IMPRESSION: 1. Bilobar hepatic metastasis are similar to recent PET-CT but progressed from CT May 11, 2023. 2. Extensive osseous metastatic disease involving the axial and proximal appendicular skeleton with a few areas showing increased sclerosis which may reflect treatment response but is nonspecific. Suggest attention on follow-up imaging. 3. Stable right axillary and right external iliac lymph nodes. 4. Subcutaneous/cutaneous nodular soft tissue thickening in the posterior upper back which was hypermetabolic on prior PET-CT is similar to that examination, compatible with disease involvement. 5. Gas fluid levels in a patulous esophagus, suggestive of gastroesophageal reflux. 6. Colonic stool burden compatible with constipation.   7/2 PET  IMPRESSION: 1. Overall disease progression as evidenced by increase  in the hypermetabolic right axillary lymph nodes, new hypermetabolic mediastinal adenopathy and new hepatic metastases. 2. New left cervical level II lymph node, worrisome for metastatic disease. 3. Minimal improvement in right external iliac adenopathy. 4. Overall minimal improvement in osseous metastatic disease.   6/6:  Head CT  IMPRESSION: 1. No acute intracranial abnormality. 2. Extensive, predominantly stable areas of osseous metastasis throughout the calvarium. 3. New area of enhancing soft tissue attenuation along the lateral aspect of the right globe, as described above. MRI correlation is recommended. 4. Multiple areas of soft tissue attenuation throughout the scalp, mildly increased in size and number when compared to the prior  study. Mild interval progression of metastatic disease cannot be excluded.    COGNITION: Overall cognitive status: Within functional limits for tasks assessed   SENSATION: WFL  COORDINATION: Limited due to ROM deficits. WFL    MUSCLE TONE: WFL    POSTURE: rounded shoulders, forward head, and decreased lumbar lordosis  LOWER EXTREMITY MMT:   MMT  Right Eval Left Eval  Hip flexion 4 4  Hip extension    Hip abduction 4 4  Hip adduction 4+ 4+  Hip internal rotation    Hip external rotation    Knee flexion 4+ 4+  Knee extension 4+ 4+  Ankle dorsiflexion 5 5  Ankle plantarflexion 4 4  Ankle inversion    Ankle eversion     (Blank rows = not tested) MMT  Right Eval Left Eval  Shoulder  flexion 4- 4  shoulder extension 4+ 4+  Shoulder abduction 4 4  Shoulder adduction 4+ 4+  Shoulder  internal rotation 4- 4  Shoulder external rotation 4+ 4+  Elbow flexion 4 4+  Elbow extension 4+ 4+                    LOWER EXTREMITY ROM:     Limited shoulder flexion and abduction to ~ 85 DEG  on the R shoulder   BED MOBILITY:  Sit to supine SBA Supine to sit SBA  TRANSFERS: Assistive device utilized: None  Sit to stand:  Modified independence Stand to sit: Modified independence Chair to chair: Modified independence Floor: Modified independence   CURB:  Level of Assistance: CGA Assistive device utilized: Youth worker Comments: step to pattern  STAIRS: Level of Assistance: CGA Stair Negotiation Technique: Step to Pattern with Bilateral Rails Number of Stairs: 4  Height of Stairs: 6  Comments: step to with BUE support   GAIT: Gait pattern: step through pattern genu recurvatum on the RLE.   Distance walked: 185ft Assistive device utilized: None Level of assistance: SBA Comments: no circumduction, greatly improved since last bout of PT treatment.   FUNCTIONAL TESTS:  5 times sit to stand: 16.57 sec with UE support   Timed up and go (TUG): 9.4 sec   6 minute walk test: TBD 10 meter walk test:  9.55 sec (1.59m/s)  Berg Balance Scale: to be completed  Functional gait assessment: 19/30   PATIENT SURVEYS:  ABC scale: The Activities-Specific Balance Confidence (ABC) Scale 0% 10 20 30  40 50 60 70 80 90 100% No confidence<->completely confident  "How confident are you that you will not lose your balance or become unsteady when you . . .   Date tested 11/24/23   Walk around the house 100%  2. Walk up or down stairs 100%  3. Bend over and pick up a slipper from in front of a closet floor 100%  4. Reach for a small can off a shelf at eye level 100%  5. Stand on tip toes and reach for something above your head 100%  6. Stand on a chair and reach for something 60%  7. Sweep the floor 100%  8. Walk outside the house to a car parked in the driveway 100%  9. Get into or out of a car 100%  10. Walk across a parking lot to the mall 100%  11. Walk up or down a ramp 100%  12. Walk in a crowded mall where people rapidly walk past you 100%  13. Are bumped into by people as you walk through the mall 80%  14. Step onto or off of an escalator while you are holding onto the railing 100%  15. Step onto or off an  escalator while holding onto parcels such that you cannot hold onto the railing 80%  16. Walk outside on icy sidewalks 70%  Total: #/16  93%     TODAY'S TREATMENT:                                                                                                                              DATE: 11/24/2023  PT EVAL.     PATIENT EDUCATION: Education details: Pt educated throughout session about proper posture and technique with exercises. Improved exercise technique, movement at target joints, use of target muscles after min to mod verbal, visual, tactile cues.  Person educated: Patient Education method: Explanation Education comprehension: verbalized understanding  HOME EXERCISE PROGRAM:  Will set up at subsequent PT visit.   GOALS: Goals reviewed with patient? Yes  SHORT TERM GOALS: Target date: 12/22/2023   Patient will be independent in home exercise program to improve strength/mobility for better functional independence with ADLs. Baseline: to provide at session 1 or 2  Goal status: INITIAL   LONG TERM GOALS: Target date: 01/19/2024    Patient will increase LEFS score to equal to or greater than 8 points   to demonstrate statistically significant improvement in mobility and quality of life.  Baseline:  to be completed   Goal status: INITIAL  2.  Patient (> 96 years old) will complete five times sit to stand test in < 15 seconds without use of BUE indicating an increased LE strength and improved balance. Baseline: 16.57 sec with UE supported on arm rest  Goal status: INITIAL  3.  Patient will increase Berg Balance score by > 6 points to demonstrate decreased fall risk during functional activities Baseline: to be completed  Goal status: INITIAL  4.  Patient will increase 6 min walk test to >1236ft as to improve gait speed for better community ambulation and to reduce fall risk. Baseline: to be completed  Goal status: INITIAL    5.  Patient will ascend and descent  stairs without UE support to improve independence with access to home and community   Goal status: INITIAL  6.  Patient will increase FGA score to >22 as to demonstrate reduced fall risk and improved dynamic gait balance for better safety with community/home ambulation. Baseline:19 Goal status: INITIAL   ASSESSMENT:  CLINICAL IMPRESSION: Pt put forth excellent effort for PT  EVal to address functional deficits listed above secondary to continued CA treatment. Decreased strength in BLE limits ability to perform sit<>stand without UE support as well as continued balance deficits indicated in FGA 19/30. Pt also noted to have significant ROM deficits in BUE (RUE>LUE) as a result of metastases in shoulder complex.  Pt will benefit from skilled PT pending additional authorization for PT treatment  to address balance, strength and ROM deficits, improve safety  and QoL while continuing CA treatment.    OBJECTIVE IMPAIRMENTS: Abnormal gait, cardiopulmonary status limiting activity, decreased activity tolerance, decreased balance, decreased endurance, decreased knowledge of condition, decreased mobility, difficulty walking, decreased ROM, decreased strength, hypomobility, increased fascial restrictions, impaired perceived functional ability, impaired flexibility, impaired UE functional use, improper body mechanics, postural dysfunction, and pain.   ACTIVITY LIMITATIONS: carrying, lifting, bending, standing, squatting, sleeping, stairs, transfers, bathing, toileting, dressing, reach over head, and locomotion level  PARTICIPATION LIMITATIONS: cleaning, laundry, driving, shopping, community activity, and yard work  PERSONAL FACTORS: Age, Behavior pattern, Fitness, Past/current experiences, and 1-2 comorbidities: CA with profound metastases  are also affecting patient's functional outcome.   REHAB POTENTIAL: Good  CLINICAL DECISION MAKING: Evolving/moderate complexity  EVALUATION COMPLEXITY:  Moderate  PLAN:  PT FREQUENCY: 1-2x/week  PT DURATION: 8 weeks  PLANNED INTERVENTIONS: Therapeutic exercises, Therapeutic activity, Neuromuscular re-education, Balance training, Gait training, Patient/Family education, Self Care, Joint mobilization, Stair training, DME instructions, Cryotherapy, Moist heat, and Manual therapy  PLAN FOR NEXT SESSION:   Complete 6 min walk test and Berg.   Continue BLE strengthening and balance training.  UE and cervical ROM as tolerated.     Massie FORBES Dollar, PT 11/24/2023, 5:53 PM

## 2023-11-26 ENCOUNTER — Encounter: Attending: Physician Assistant | Admitting: Physician Assistant

## 2023-11-26 ENCOUNTER — Telehealth: Payer: Self-pay | Admitting: Hematology and Oncology

## 2023-11-26 DIAGNOSIS — T66XXXA Radiation sickness, unspecified, initial encounter: Secondary | ICD-10-CM | POA: Insufficient documentation

## 2023-11-26 DIAGNOSIS — E038 Other specified hypothyroidism: Secondary | ICD-10-CM | POA: Insufficient documentation

## 2023-11-26 DIAGNOSIS — C44599 Other specified malignant neoplasm of skin of other part of trunk: Secondary | ICD-10-CM | POA: Diagnosis present

## 2023-11-26 DIAGNOSIS — L98421 Non-pressure chronic ulcer of back limited to breakdown of skin: Secondary | ICD-10-CM | POA: Diagnosis not present

## 2023-11-26 DIAGNOSIS — L98492 Non-pressure chronic ulcer of skin of other sites with fat layer exposed: Secondary | ICD-10-CM | POA: Insufficient documentation

## 2023-11-26 DIAGNOSIS — L988 Other specified disorders of the skin and subcutaneous tissue: Secondary | ICD-10-CM | POA: Insufficient documentation

## 2023-11-26 DIAGNOSIS — X58XXXA Exposure to other specified factors, initial encounter: Secondary | ICD-10-CM | POA: Insufficient documentation

## 2023-11-26 NOTE — Telephone Encounter (Signed)
 Rescheduled patient appointments on 9/17. Called and left the patient a voicemail with the new appointment details.

## 2023-11-30 ENCOUNTER — Encounter: Payer: Self-pay | Admitting: *Deleted

## 2023-11-30 ENCOUNTER — Inpatient Hospital Stay (HOSPITAL_BASED_OUTPATIENT_CLINIC_OR_DEPARTMENT_OTHER): Admitting: Hematology and Oncology

## 2023-11-30 ENCOUNTER — Inpatient Hospital Stay

## 2023-11-30 VITALS — BP 118/78 | HR 107 | Temp 98.7°F | Resp 20 | Ht 70.0 in | Wt 149.8 lb

## 2023-11-30 DIAGNOSIS — Z17411 Hormone receptor positive with human epidermal growth factor receptor 2 negative status: Secondary | ICD-10-CM | POA: Diagnosis not present

## 2023-11-30 DIAGNOSIS — C799 Secondary malignant neoplasm of unspecified site: Secondary | ICD-10-CM

## 2023-11-30 DIAGNOSIS — G629 Polyneuropathy, unspecified: Secondary | ICD-10-CM | POA: Diagnosis not present

## 2023-11-30 DIAGNOSIS — C7951 Secondary malignant neoplasm of bone: Secondary | ICD-10-CM | POA: Diagnosis present

## 2023-11-30 DIAGNOSIS — Z79899 Other long term (current) drug therapy: Secondary | ICD-10-CM | POA: Diagnosis not present

## 2023-11-30 DIAGNOSIS — C50512 Malignant neoplasm of lower-outer quadrant of left female breast: Secondary | ICD-10-CM | POA: Diagnosis not present

## 2023-11-30 DIAGNOSIS — R569 Unspecified convulsions: Secondary | ICD-10-CM | POA: Diagnosis not present

## 2023-11-30 DIAGNOSIS — Z9221 Personal history of antineoplastic chemotherapy: Secondary | ICD-10-CM | POA: Diagnosis not present

## 2023-11-30 DIAGNOSIS — Z170 Estrogen receptor positive status [ER+]: Secondary | ICD-10-CM | POA: Diagnosis not present

## 2023-11-30 DIAGNOSIS — Z923 Personal history of irradiation: Secondary | ICD-10-CM | POA: Diagnosis not present

## 2023-11-30 DIAGNOSIS — Z79811 Long term (current) use of aromatase inhibitors: Secondary | ICD-10-CM | POA: Diagnosis not present

## 2023-11-30 DIAGNOSIS — Z9012 Acquired absence of left breast and nipple: Secondary | ICD-10-CM | POA: Diagnosis not present

## 2023-11-30 LAB — CBC WITH DIFFERENTIAL (CANCER CENTER ONLY)
Abs Immature Granulocytes: 0 K/uL (ref 0.00–0.07)
Basophils Absolute: 0 K/uL (ref 0.0–0.1)
Basophils Relative: 1 %
Eosinophils Absolute: 0 K/uL (ref 0.0–0.5)
Eosinophils Relative: 1 %
HCT: 32.7 % — ABNORMAL LOW (ref 36.0–46.0)
Hemoglobin: 11 g/dL — ABNORMAL LOW (ref 12.0–15.0)
Immature Granulocytes: 0 %
Lymphocytes Relative: 10 %
Lymphs Abs: 0.3 K/uL — ABNORMAL LOW (ref 0.7–4.0)
MCH: 38.6 pg — ABNORMAL HIGH (ref 26.0–34.0)
MCHC: 33.6 g/dL (ref 30.0–36.0)
MCV: 114.7 fL — ABNORMAL HIGH (ref 80.0–100.0)
Monocytes Absolute: 0.4 K/uL (ref 0.1–1.0)
Monocytes Relative: 13 %
Neutro Abs: 2.2 K/uL (ref 1.7–7.7)
Neutrophils Relative %: 75 %
Platelet Count: 143 K/uL — ABNORMAL LOW (ref 150–400)
RBC: 2.85 MIL/uL — ABNORMAL LOW (ref 3.87–5.11)
RDW: 17.6 % — ABNORMAL HIGH (ref 11.5–15.5)
WBC Count: 3 K/uL — ABNORMAL LOW (ref 4.0–10.5)
nRBC: 0 % (ref 0.0–0.2)

## 2023-11-30 LAB — CMP (CANCER CENTER ONLY)
ALT: 10 U/L (ref 0–44)
AST: 32 U/L (ref 15–41)
Albumin: 3.8 g/dL (ref 3.5–5.0)
Alkaline Phosphatase: 138 U/L — ABNORMAL HIGH (ref 38–126)
Anion gap: 3 — ABNORMAL LOW (ref 5–15)
BUN: 8 mg/dL (ref 8–23)
CO2: 25 mmol/L (ref 22–32)
Calcium: 8.3 mg/dL — ABNORMAL LOW (ref 8.9–10.3)
Chloride: 110 mmol/L (ref 98–111)
Creatinine: 0.48 mg/dL (ref 0.44–1.00)
GFR, Estimated: >60 mL/min (ref >60)
Glucose, Bld: 102 mg/dL — ABNORMAL HIGH (ref 70–99)
Potassium: 4 mmol/L (ref 3.5–5.1)
Sodium: 138 mmol/L (ref 135–145)
Total Bilirubin: 0.6 mg/dL (ref 0.0–1.2)
Total Protein: 6.1 g/dL — ABNORMAL LOW (ref 6.5–8.1)

## 2023-11-30 MED ORDER — DENOSUMAB 120 MG/1.7ML ~~LOC~~ SOLN
120.0000 mg | Freq: Once | SUBCUTANEOUS | Status: AC
  Administered 2023-11-30: 120 mg via SUBCUTANEOUS
  Filled 2023-11-30: qty 1.7

## 2023-11-30 NOTE — Progress Notes (Signed)
 Ok to proceed with xgeva  per Dr Odean

## 2023-11-30 NOTE — Assessment & Plan Note (Signed)
 10/26/17: Left mastectomy: IDC grade 1, 2 foci largest spans 8.5 cm, intermediate grade DCIS, lymphovascular invasion identified, perineural invasion identified, 1/2 lymph nodes positive with extracapsular extension, ER 9200%, PR 5 to 50%, HER-2 negative, Ki-67 10 to 15%, T3N1A   Oncotype DX score 22, intermediate risk, chemotherapy not felt to have significant benefit.   Treatment Summary: 1. Antiestrogen therapy with anastrozole  1 mg daily started 07/15/2016 2. Mastectomy 10/26/2017, Mammaprint low risk luminal type A 3. Followed by adjuvant radiation 12/08/17- 01/26/18  4. Followed by adjuvant antiestrogen therapy anastrozole  started 01/17/2018 (originally started 07/15/2016) 5.  August 2023: Low back pain: Large lesion in the sacrum biopsy of sacrum: 12/26/2021: Metastatic breast cancer, ER 90%, PR 10%, HER2 negative (0) 6. Ibrance  along with Faslodex  started 12/25/2021-10/14/2022 7. Palliative radiation to the sacrum completed 01/19/2022 8.  Enhertu  started 10/21/2022-07/06/2023 9.  Skin biopsy: 06/14/2023: Poorly differentiated adenocarcinoma x 2 biopsies: ER 99%, PR 2%, HER2 1+, Ki-67 25%, [facility] molecular testing: HER2 low, hormone receptor positive, ESR 1 mutation (no PIK3CA, no PALB2, no PD-L1, TMB 4) 10. 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  Will continue with the current treatment. Marked Improvement in the CA 27-29 Low TSH: I will reduce her dose of Synthroid  to 100 mcg daily.

## 2023-11-30 NOTE — Progress Notes (Signed)
 Patient Care Team: Vernon Velna SAUNDERS, MD as PCP - General (Internal Medicine) Odean Potts, MD as Consulting Physician (Hematology and Oncology) Aron Shoulders, MD as Consulting Physician (General Surgery) Dewey Rush, MD as Consulting Physician (Radiation Oncology) Lenn Aran, MD as Consulting Physician (Radiation Oncology) Lenn Aran, MD as Consulting Physician (Radiation Oncology)  DIAGNOSIS:  Encounter Diagnosis  Name Primary?   Malignant neoplasm of lower-outer quadrant of left breast of female, estrogen receptor positive (HCC) Yes    SUMMARY OF ONCOLOGIC HISTORY: Oncology History  Malignant neoplasm of lower-outer quadrant of left breast of female, estrogen receptor positive (HCC)  06/11/2016 Mammogram   Palpable left breast masses 3:00 position: 2.2 cm; 5:30 position: 2.5 cm; 6:30 position: 0.7 cm   06/19/2016 Initial Diagnosis   Left breast biopsy 3:30: IDC with DCIS grade 1, ER 90%, PR 50%, Ki-67 15%, HER-2 negative ratio 1.13; biopsy 5:30 position: IDC grade 1   07/13/2016 Breast MRI   Large area of abnormal enhancement lower inner and lower outer quadrants left breast spanning 9 cm x 6.4 cm x 5.3 cm, no abnormal enlarged lymph nodes; T3 N0 stage II a (New AJCC staging)    07/15/2016 - 12/08/2017 Anti-estrogen oral therapy   Neoadjuvant anastrozole  1 mg daily   07/17/2016 Oncotype testing   Testing done on the biopsy: Oncotype DX score 22, intermediate risk   02/02/2017 Breast MRI   Left breast multicentric disease unchanged measuring 2.7 x 1.6 cm.  Mass in the non-mass enhancement are also not significantly changed measuring 6.2 x 2.4 cm. new enhancing mass within the outer right breast 7 mm which could be fat necrosis or inclusion cyst    02/09/2017 Imaging   Ultrasound of the right breast lesion noted on MRI: No sonographic finding corresponds to the abnormality noted on MRI   07/13/2017 Cancer Staging   Staging form: Breast, AJCC 8th Edition - Clinical stage  from 07/13/2017: Stage IIA (cT3, cN0, cM0, G1, ER+, PR+, HER2-) - Signed by Crawford Morna Pickle, NP on 05/18/2018   10/26/2017 Surgery   Left mastectomy: IDC grade 1, 2 foci largest spans 8.5 cm, intermediate grade DCIS, lymphovascular invasion identified, perineural invasion identified, 1/2 lymph nodes positive with extracapsular extension, ER 9200%, PR 5 to 50%, HER-2 negative, Ki-67 10 to 15%, T3N1A Mammaprint: low risk   11/02/2017 Cancer Staging   Staging form: Breast, AJCC 8th Edition - Pathologic: No Stage Recommended (ypT3, pN1a, cM0, G1, ER+, PR+, HER2-) - Signed by Odean Potts, MD on 11/02/2017   12/08/2017 - 01/26/2018 Radiation Therapy   Adjuvant radiation therapy    02/2018 -  Anti-estrogen oral therapy   Anastrozole  1 mg daily adjuvant therapy   10/21/2022 - 07/06/2023 Chemotherapy   Patient is on Treatment Plan : BREAST METASTATIC Fam-Trastuzumab Deruxtecan-nxki  (Enhertu ) (5.4) q21d       CHIEF COMPLIANT: Follow-up on Xeloda  after recent scans  HISTORY OF PRESENT ILLNESS:  History of Present Illness Kathryn Lucas is a 63 year old female with metastatic breast cancer who presents for follow-up of her cancer treatment. She is accompanied by her sister, Kathryn Lucas.  She is undergoing treatment for metastatic breast cancer with liver lesions measuring 10 to 12 millimeters and sclerotic changes in bone lesions. Her tumor marker, CA 27-29, has decreased to 656. She notes significant improvement, with flattened skin nodules and resolution of tumors on her head and back. Eye swelling has resolved spontaneously.  Her current medication regimen, started on September 29, 2023, is effective. She experiences increased  bowel movements without diarrhea, which is an improvement from previous constipation. Neuropathy persists from prior treatments, but there are no recent tremors or seizures.  She manages anxiety and obsessive-compulsive disorder with regular MRI and ultrasound screenings. She  experiences significant anxiety related to waiting for test results and potential further imaging.  She is engaged in physical therapy and feels stronger, though insurance coverage is uncertain. She manages her diet by avoiding foods that cause discomfort, such as yogurt, and focuses on a protein-rich diet.     ALLERGIES:  is allergic to percocet [oxycodone -acetaminophen ].  MEDICATIONS:  Current Outpatient Medications  Medication Sig Dispense Refill   Calcium  500-100 MG-UNIT CHEW Chew 1 tablet by mouth daily. 60 tablet    capecitabine  (XELODA ) 500 MG tablet Take 3 tablets (1,500 mg total) by mouth 2 (two) times daily after a meal. 14 days on and 7 days off 84 tablet 6   cholecalciferol (VITAMIN D3) 25 MCG (1000 UNIT) tablet Take 1 tablet (1,000 Units total) by mouth daily.     furosemide  (LASIX ) 20 MG tablet Take 1 tablet (20 mg total) by mouth daily. If needed may take 1/2 tab q am 30 tablet 0   lamoTRIgine  (LAMICTAL ) 25 MG tablet Take 1 tablet (25 mg total) by mouth daily. 30 tablet 2   levothyroxine  (SYNTHROID ) 100 MCG tablet Take 1 tablet (100 mcg total) by mouth daily before breakfast. 90 tablet 1   omeprazole  (PRILOSEC) 20 MG capsule TAKE 1 CAPSULE BY MOUTH ONCE DAILY 30 capsule 3   scopolamine  (TRANSDERM-SCOP) 1 MG/3DAYS Place 1 patch (1 mg total) onto the skin every 3 (three) days. 10 patch 0   vitamin C  (ASCORBIC ACID) 250 MG tablet Take 1 tablet (250 mg total) by mouth daily.     Zinc  Acetate, Oral, (ZINC  ACETATE PO) Take by mouth.     No current facility-administered medications for this visit.    PHYSICAL EXAMINATION: ECOG PERFORMANCE STATUS: 1 - Symptomatic but completely ambulatory  Vitals:   11/30/23 1448  BP: 118/78  Pulse: (!) 107  Resp: 20  Temp: 98.7 F (37.1 C)  SpO2: 100%   Filed Weights   11/30/23 1448  Weight: 149 lb 12.8 oz (67.9 kg)    Physical Exam   (exam performed in the presence of a chaperone)  LABORATORY DATA:  I have reviewed the data as  listed    Latest Ref Rng & Units 11/30/2023    2:24 PM 11/08/2023    2:34 PM 10/22/2023    1:05 PM  CMP  Glucose 70 - 99 mg/dL 897  90  891   BUN 8 - 23 mg/dL 8  8  10    Creatinine 0.44 - 1.00 mg/dL 9.51  9.51  9.42   Sodium 135 - 145 mmol/L 138  136  136   Potassium 3.5 - 5.1 mmol/L 4.0  4.2  4.1   Chloride 98 - 111 mmol/L 110  107  107   CO2 22 - 32 mmol/L 25  23  23    Calcium  8.9 - 10.3 mg/dL 8.3  8.5  8.3   Total Protein 6.5 - 8.1 g/dL 6.1  5.9  6.5   Total Bilirubin 0.0 - 1.2 mg/dL 0.6  0.5  0.8   Alkaline Phos 38 - 126 U/L 138  151  164   AST 15 - 41 U/L 32  38  61   ALT 0 - 44 U/L 10  11  14      Lab Results  Component  Value Date   WBC 3.0 (L) 11/30/2023   HGB 11.0 (L) 11/30/2023   HCT 32.7 (L) 11/30/2023   MCV 114.7 (H) 11/30/2023   PLT 143 (L) 11/30/2023   NEUTROABS 2.2 11/30/2023    ASSESSMENT & PLAN:  Malignant neoplasm of lower-outer quadrant of left breast of female, estrogen receptor positive (HCC) 10/26/17: Left mastectomy: IDC grade 1, 2 foci largest spans 8.5 cm, intermediate grade DCIS, lymphovascular invasion identified, perineural invasion identified, 1/2 lymph nodes positive with extracapsular extension, ER 9200%, PR 5 to 50%, HER-2 negative, Ki-67 10 to 15%, T3N1A   Oncotype DX score 22, intermediate risk, chemotherapy not felt to have significant benefit.   Treatment Summary: 1. Antiestrogen therapy with anastrozole  1 mg daily started 07/15/2016 2. Mastectomy 10/26/2017, Mammaprint low risk luminal type A 3. Followed by adjuvant radiation 12/08/17- 01/26/18  4. Followed by adjuvant antiestrogen therapy anastrozole  started 01/17/2018 (originally started 07/15/2016) 5.  August 2023: Low back pain: Large lesion in the sacrum biopsy of sacrum: 12/26/2021: Metastatic breast cancer, ER 90%, PR 10%, HER2 negative (0) 6. Ibrance  along with Faslodex  started 12/25/2021-10/14/2022 7. Palliative radiation to the sacrum completed 01/19/2022 8.  Enhertu  started  10/21/2022-07/06/2023 9.  Skin biopsy: 06/14/2023: Poorly differentiated adenocarcinoma x 2 biopsies: ER 99%, PR 2%, HER2 1+, Ki-67 25%, [facility] molecular testing: HER2 low, hormone receptor positive, ESR 1 mutation (no PIK3CA, no PALB2, no PD-L1, TMB 4) 10. 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  Will continue with the current treatment. Marked Improvement in the CA 27-29 ------------------------------------- Assessment and Plan Assessment & Plan Metastatic breast cancer with liver and bone involvement Metastatic breast cancer stable with liver lesions unchanged and bone lesions sclerotic. Under-skin nodules flat, tumors on head and back resolved. CA 27-29 stable at 656, consistent with scans. Current treatment effective with no significant side effects. - Continue Anastrozole  and Xeloda . - Monitor CA 27-29 at each visit. - Schedule regular scans for lesion stability. - Administer Xgeva  as scheduled.  Chemotherapy-induced peripheral neuropathy Persistent neuropathy from previous treatment, no new tremors or  seizures. Dizziness and vertigo resolved after medication change. - Encourage moisturizing hands and feet 2-3 times daily.      Orders Placed This Encounter  Procedures   CBC with Differential (Cancer Center Only)    Standing Status:   Standing    Number of Occurrences:   20    Expiration Date:   11/29/2024   CMP (Cancer Center only)    Standing Status:   Standing    Number of Occurrences:   20    Expiration Date:   11/29/2024   CA 27.29    Standing Status:   Standing    Number of Occurrences:   20    Expiration Date:   11/29/2024   The patient has a good  understanding of the overall plan. she agrees with it. she will call with any problems that may develop before the next visit here. Total time spent: 30 mins including face to face time and time spent for planning, charting and co-ordination of care   Naomi MARLA Chad, MD 11/30/23

## 2023-11-30 NOTE — Progress Notes (Signed)
 Per MD okay to treat today with Ca 8.3.  MD recommends pt to take 1200 mg p.o calcium  daily x 3 days.  Infusion will educate pt.

## 2023-12-01 ENCOUNTER — Ambulatory Visit: Admitting: Hematology and Oncology

## 2023-12-01 ENCOUNTER — Ambulatory Visit

## 2023-12-01 ENCOUNTER — Other Ambulatory Visit

## 2023-12-02 ENCOUNTER — Inpatient Hospital Stay: Attending: Radiation Oncology

## 2023-12-02 DIAGNOSIS — Z923 Personal history of irradiation: Secondary | ICD-10-CM | POA: Insufficient documentation

## 2023-12-02 DIAGNOSIS — R569 Unspecified convulsions: Secondary | ICD-10-CM | POA: Insufficient documentation

## 2023-12-02 DIAGNOSIS — Z170 Estrogen receptor positive status [ER+]: Secondary | ICD-10-CM | POA: Insufficient documentation

## 2023-12-02 DIAGNOSIS — Z79811 Long term (current) use of aromatase inhibitors: Secondary | ICD-10-CM | POA: Insufficient documentation

## 2023-12-02 DIAGNOSIS — Z79899 Other long term (current) drug therapy: Secondary | ICD-10-CM | POA: Insufficient documentation

## 2023-12-02 DIAGNOSIS — C7951 Secondary malignant neoplasm of bone: Secondary | ICD-10-CM | POA: Insufficient documentation

## 2023-12-02 DIAGNOSIS — C7931 Secondary malignant neoplasm of brain: Secondary | ICD-10-CM | POA: Insufficient documentation

## 2023-12-02 DIAGNOSIS — Z9221 Personal history of antineoplastic chemotherapy: Secondary | ICD-10-CM | POA: Insufficient documentation

## 2023-12-02 DIAGNOSIS — C50512 Malignant neoplasm of lower-outer quadrant of left female breast: Secondary | ICD-10-CM | POA: Insufficient documentation

## 2023-12-02 DIAGNOSIS — Z9012 Acquired absence of left breast and nipple: Secondary | ICD-10-CM | POA: Insufficient documentation

## 2023-12-02 NOTE — Progress Notes (Signed)
 Nutrition Follow-up:   Patient with metastatic breast cancer.  S/p left mastectomy.  Patient completed palliative radiation to skin.  Currently on xeloda , followed by Dr Odean.  Spoke with patient by phone for follow-up.  Reports that her appetite is good.  Ate apples and london broil sandwich for lunch today.  Has not tried ensure clear shake yet.  Says that gas is better.     Medications: reviewed  Labs: reviewed  Anthropometrics:   Weight 149 lb 12.8 oz on 9/16 141 lb on 8/22 154 lb on 5/13   NUTRITION DIAGNOSIS: Unintentional weight loss improving   INTERVENTION:  Encouraged well balanced diet including lean protein Patient will reach out to RD if needed in the future.  Patient has RD contact information    NEXT VISIT: no follow-up RD available as needed  Kathryn Lucas B. Dasie SOLON, CSO, LDN Registered Dietitian [phone]

## 2023-12-03 ENCOUNTER — Encounter: Payer: Self-pay | Admitting: Internal Medicine

## 2023-12-03 ENCOUNTER — Inpatient Hospital Stay (HOSPITAL_BASED_OUTPATIENT_CLINIC_OR_DEPARTMENT_OTHER): Admitting: Internal Medicine

## 2023-12-03 VITALS — BP 110/73 | HR 109 | Temp 97.8°F | Resp 20 | Wt 143.7 lb

## 2023-12-03 DIAGNOSIS — C50512 Malignant neoplasm of lower-outer quadrant of left female breast: Secondary | ICD-10-CM

## 2023-12-03 DIAGNOSIS — Z79899 Other long term (current) drug therapy: Secondary | ICD-10-CM | POA: Diagnosis not present

## 2023-12-03 DIAGNOSIS — C7931 Secondary malignant neoplasm of brain: Secondary | ICD-10-CM | POA: Diagnosis not present

## 2023-12-03 DIAGNOSIS — C7951 Secondary malignant neoplasm of bone: Secondary | ICD-10-CM

## 2023-12-03 DIAGNOSIS — Z923 Personal history of irradiation: Secondary | ICD-10-CM | POA: Diagnosis not present

## 2023-12-03 DIAGNOSIS — R569 Unspecified convulsions: Secondary | ICD-10-CM | POA: Diagnosis present

## 2023-12-03 DIAGNOSIS — Z170 Estrogen receptor positive status [ER+]: Secondary | ICD-10-CM

## 2023-12-03 DIAGNOSIS — Z79811 Long term (current) use of aromatase inhibitors: Secondary | ICD-10-CM | POA: Diagnosis not present

## 2023-12-03 DIAGNOSIS — Z9012 Acquired absence of left breast and nipple: Secondary | ICD-10-CM | POA: Diagnosis not present

## 2023-12-03 DIAGNOSIS — Z9221 Personal history of antineoplastic chemotherapy: Secondary | ICD-10-CM | POA: Diagnosis not present

## 2023-12-03 DIAGNOSIS — M898X8 Other specified disorders of bone, other site: Secondary | ICD-10-CM

## 2023-12-03 MED ORDER — LAMOTRIGINE 25 MG PO TABS: 25.0000 mg | ORAL_TABLET | Freq: Every day | ORAL | 2 refills | Status: AC

## 2023-12-03 NOTE — Progress Notes (Signed)
 [facility] at [facility] [address]  [place], [place] [address] [phone]   Interval Evaluation  Date of Service: 12/03/23 Patient Name: Kathryn Lucas Patient MRN: [id number] Patient DOB: 1960/10/03 Provider: Arthea MARLA Manns, MD  Identifying Statement:  Kathryn Lucas is a 63 y.o. female with Focal seizure (HCC)    Primary Cancer:  Oncologic History: Oncology History  Malignant neoplasm of lower-outer quadrant of left breast of female, estrogen receptor positive (HCC)  06/11/2016 Mammogram   Palpable left breast masses 3:00 position: 2.2 cm; 5:30 position: 2.5 cm; 6:30 position: 0.7 cm   06/19/2016 Initial Diagnosis   Left breast biopsy 3:30: IDC with DCIS grade 1, ER 90%, PR 50%, Ki-67 15%, HER-2 negative ratio 1.13; biopsy 5:30 position: IDC grade 1   07/13/2016 Breast MRI   Large area of abnormal enhancement lower inner and lower outer quadrants left breast spanning 9 cm x 6.4 cm x 5.3 cm, no abnormal enlarged lymph nodes; T3 N0 stage II a (New AJCC staging)    07/15/2016 - 12/08/2017 Anti-estrogen oral therapy   Neoadjuvant anastrozole  1 mg daily   07/17/2016 Oncotype testing   Testing done on the biopsy: Oncotype DX score 22, intermediate risk   02/02/2017 Breast MRI   Left breast multicentric disease unchanged measuring 2.7 x 1.6 cm.  Mass in the non-mass enhancement are also not significantly changed measuring 6.2 x 2.4 cm. new enhancing mass within the outer right breast 7 mm which could be fat necrosis or inclusion cyst    02/09/2017 Imaging   Ultrasound of the right breast lesion noted on MRI: No sonographic finding corresponds to the abnormality noted on MRI   07/13/2017 Cancer Staging   Staging form: Breast, AJCC 8th Edition - Clinical stage from 07/13/2017: Stage IIA (cT3, cN0, cM0, G1, ER+, PR+, HER2-) - Signed by Crawford Morna Pickle, NP on 05/18/2018   10/26/2017 Surgery   Left mastectomy: IDC grade 1, 2 foci largest spans 8.5  cm, intermediate grade DCIS, lymphovascular invasion identified, perineural invasion identified, 1/2 lymph nodes positive with extracapsular extension, ER 9200%, PR 5 to 50%, HER-2 negative, Ki-67 10 to 15%, T3N1A Mammaprint: low risk   11/02/2017 Cancer Staging   Staging form: Breast, AJCC 8th Edition - Pathologic: No Stage Recommended (ypT3, pN1a, cM0, G1, ER+, PR+, HER2-) - Signed by Odean Potts, MD on 11/02/2017   12/08/2017 - 01/26/2018 Radiation Therapy   Adjuvant radiation therapy    02/2018 -  Anti-estrogen oral therapy   Anastrozole  1 mg daily adjuvant therapy   10/21/2022 - 07/06/2023 Chemotherapy   Patient is on Treatment Plan : BREAST METASTATIC Fam-Trastuzumab Deruxtecan-nxki  (Enhertu ) (5.4) q21d      CNS Oncologic History 10/15/23: 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 Lenn  Interval History: Kathryn Lucas presents today for follow up after recently completing radiation, CT head.  No issues with treatment.  Since having difficulty with several seizure medications, she is doing well now with the low dose of Lamictal .  No issues with gait, denies headaches.     Prior: She describes episode of sudden onset speech arrest on 08/20/23, noticed by sister when she was on the phone with the patient.  Episode lasted several minutes and was followed by period of confusion.  She eventually returned to baseline; hospital/ED team was concerned for seizure and started her on Keppra  500mg  twice per day, which she has been taking.  Does describe a second episode of same semiology, happened  earlier this week in the morning.  No other episodes, or new or progerssive neurologic changes otherwise.  Enhertu  has been on hold since April given tolerability issues, continues to follow with Dr. Gudena.  H+P (09/24/22) Patient presents today to review recent neurologic symptoms.  She describes an episode of sudden inability to speak, not making sense which happened at the end of last  month with her sister in [place].  This led to a hospitalization at [facility] for stroke evaluation.  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  She was started on decadron , which she attributes to subsequent blurry vision and double vision.  This has improved and mostly resolved since the steroids were stopped earlier in the week.  Continues to experience significant stiffness and immobility in her neck.  Medications: Current Outpatient Medications on File Prior to Visit  Medication Sig Dispense Refill   Calcium  500-100 MG-UNIT CHEW Chew 1 tablet by mouth daily. 60 tablet    capecitabine  (XELODA ) 500 MG tablet Take 3 tablets (1,500 mg total) by mouth 2 (two) times daily after a meal. 14 days on and 7 days off 84 tablet 6   cholecalciferol (VITAMIN D3) 25 MCG (1000 UNIT) tablet Take 1 tablet (1,000 Units total) by mouth daily.     furosemide  (LASIX ) 20 MG tablet Take 1 tablet (20 mg total) by mouth daily. If needed may take 1/2 tab q am 30 tablet 0   lamoTRIgine  (LAMICTAL ) 25 MG tablet Take 1 tablet (25 mg total) by mouth daily. 30 tablet 2   levothyroxine  (SYNTHROID ) 100 MCG tablet Take 1 tablet (100 mcg total) by mouth daily before breakfast. 90 tablet 1   omeprazole  (PRILOSEC) 20 MG capsule TAKE 1 CAPSULE BY MOUTH ONCE DAILY 30 capsule 3   scopolamine  (TRANSDERM-SCOP) 1 MG/3DAYS Place 1 patch (1 mg total) onto the skin every 3 (three) days. 10 patch 0   vitamin C  (ASCORBIC ACID) 250 MG tablet Take 1 tablet (250 mg total) by mouth daily.     Zinc  Acetate, Oral, (ZINC  ACETATE PO) Take by mouth.     No current facility-administered medications on file prior to visit.    Allergies:  Allergies  Allergen Reactions   Percocet [Oxycodone -Acetaminophen ] Nausea And Vomiting    Severe GI upset and vomiting   Past Medical History:  Past Medical History:  Diagnosis Date   Breast cancer (HCC)    left breast  cancer   Cancer (HCC) 09/2017   left breast cancer   Family history of breast cancer    Hypothyroidism    Personal history of radiation therapy 2019   Thyroid  disease    Past Surgical History:  Past Surgical History:  Procedure Laterality Date   BREAST BIOPSY Left 2018   BREAST BIOPSY Left 2019   BREAST RECONSTRUCTION WITH PLACEMENT OF TISSUE EXPANDER AND ALLODERM Left 10/26/2017   Procedure: LEFT BREAST RECONSTRUCTION WITH PLACEMENT OF TISSUE EXPANDER AND ALLODERM;  Surgeon: Arelia Filippo, MD;  Location: [facility];  Service: Plastics;  Laterality: Left;   IR IMAGING GUIDED PORT INSERTION  04/27/2023   MASTECTOMY Left 2019   MASTECTOMY WITH RADIOACTIVE SEED GUIDED EXCISION AND AXILLARY SENTINEL LYMPH NODE BIOPSY Left 10/26/2017   Procedure: LEFT MASTECTOMY WITH SEED TARGETED  LEFT AXILLARY LYMPH NODE EXCISION AND LEFT SENTINEL LYMPH NODE BIOPSY;  Surgeon: Aron Shoulders, MD;  Location: [facility];  Service: General;  Laterality:  Left;   TONSILLECTOMY     WISDOM TOOTH EXTRACTION     Social History:  Social History   Socioeconomic History   Marital status: Single    Spouse name: Not on file   Number of children: Not on file   Years of education: Not on file   Highest education level: Not on file  Occupational History   Not on file  Tobacco Use   Smoking status: Never   Smokeless tobacco: Never  Vaping Use   Vaping status: Never Used  Substance and Sexual Activity   Alcohol use: Yes    Alcohol/week: 0.0 standard drinks of alcohol    Comment: social   Drug use: Never   Sexual activity: Not on file  Other Topics Concern   Not on file  Social History Narrative   Lives alone and one dog.   Social Drivers of Corporate investment banker Strain: Not on file  Food Insecurity: No Food Insecurity (09/14/2022)   Hunger Vital Sign    Worried About Running Out of Food in the Last Year: Never true    Ran Out of Food in the Last Year: Never true   Transportation Needs: No Transportation Needs (09/14/2022)   PRAPARE - Administrator, Civil Service (Medical): No    Lack of Transportation (Non-Medical): No  Physical Activity: Not on file  Stress: Not on file  Social Connections: Not on file  Intimate Partner Violence: Not At Risk (09/14/2022)   Humiliation, Afraid, Rape, and Kick questionnaire    Fear of Current or Ex-Partner: No    Emotionally Abused: No    Physically Abused: No    Sexually Abused: No   Family History:  Family History  Problem Relation Age of Onset   Breast cancer Mother 44   Stroke Sister        thought to be due to tamoxifen use   Dementia Maternal Grandmother    COPD Paternal Grandfather     Review of Systems: Constitutional: Doesn't report fevers, chills or abnormal weight loss Eyes: Doesn't report blurriness of vision Ears, nose, mouth, throat, and face: Doesn't report sore throat Respiratory: Doesn't report cough, dyspnea or wheezes Cardiovascular: Doesn't report palpitation, chest discomfort  Gastrointestinal:  Doesn't report nausea, constipation, diarrhea GU: Doesn't report incontinence Skin: Doesn't report skin rashes Neurological: Per HPI Musculoskeletal: Doesn't report joint pain Behavioral/Psych: Doesn't report anxiety  Physical Exam: There were no vitals filed for this visit.   KPS: 70. General: Alert, cooperative, pleasant, in no acute distress Head: normal ROM in neck, some discomfort EENT: No conjunctival injection or scleral icterus.  Lungs: Resp effort normal Cardiac: Regular rate Abdomen: Non-distended abdomen Skin: No rashes cyanosis or petechiae. Extremities: No clubbing or edema  Neurologic Exam: Mental Status: Awake, alert, attentive to examiner. Oriented to self and environment. Language is fluent with intact comprehension.  Cranial Nerves: Visual acuity is grossly normal. Visual fields are full. Extra-ocular movements intact. No ptosis. Face is  symmetric Motor: Tone and bulk are normal. Power is full in both arms and legs. Reflexes are symmetric, no pathologic reflexes present.  Sensory: Intact to light touch Gait: Deferred   Labs: I have reviewed the data as listed    Component Value Date/Time   NA 138 11/30/2023 1424   K 4.0 11/30/2023 1424   CL 110 11/30/2023 1424   CO2 25 11/30/2023 1424   GLUCOSE 102 (H) 11/30/2023 1424   BUN 8 11/30/2023 1424   CREATININE 0.48 11/30/2023  1424   CALCIUM  8.3 (L) 11/30/2023 1424   PROT 6.1 (L) 11/30/2023 1424   ALBUMIN 3.8 11/30/2023 1424   AST 32 11/30/2023 1424   ALT 10 11/30/2023 1424   ALKPHOS 138 (H) 11/30/2023 1424   BILITOT 0.6 11/30/2023 1424   GFRNONAA >60 11/30/2023 1424   Lab Results  Component Value Date   WBC 3.0 (L) 11/30/2023   NEUTROABS 2.2 11/30/2023   HGB 11.0 (L) 11/30/2023   HCT 32.7 (L) 11/30/2023   MCV 114.7 (H) 11/30/2023   PLT 143 (L) 11/30/2023    Imaging:  [facility] Clinician Interpretation: I have personally reviewed the CNS images as listed.  My interpretation, in the context of the patient's clinical presentation, is stable disease   CT CHEST ABDOMEN PELVIS W CONTRAST Result Date: 11/19/2023 CLINICAL DATA:  Breast cancer, invasive stage IV, assess treatment response. * Tracking Code: BO * EXAM: CT CHEST, ABDOMEN, AND PELVIS WITH CONTRAST TECHNIQUE: Multidetector CT imaging of the chest, abdomen and pelvis was performed following the standard protocol during bolus administration of intravenous contrast. RADIATION DOSE REDUCTION: This exam was performed according to the departmental dose-optimization program which includes automated exposure control, adjustment of the mA and/or kV according to patient size and/or use of iterative reconstruction technique. CONTRAST:  80mL OMNIPAQUE  IOHEXOL  300 MG/ML  SOLN COMPARISON:  PET-CT September 15, 2023 and CT May 11, 2023 FINDINGS: CT CHEST FINDINGS Cardiovascular: Accessed right chest Port-A-Cath with tip in the right  atrium. Normal caliber thoracic aorta. Normal size heart. Trace pericardial effusion. Mediastinum/Nodes: No suspicious thyroid  nodule. Right axillary lymph node which was hypermetabolic on prior PET-CT measures 7 mm in short axis on image 17/8 previously 6 mm. Surgical clips in the left axilla. Gas fluid levels in a patulous esophagus. Lungs/Pleura: Biapical pleuroparenchymal scarring. Scattered atelectasis/scarring. Similar scarring in the perihilar right upper and lower lobes. No suspicious pulmonary nodules or masses. Subpleural reticulations in the anterior left lung likely reflects sequela prior radiation therapy. Musculoskeletal: Subcutaneous/cutaneous nodular soft tissue thickening in the posterior upper back on image 7/8 which was hypermetabolic on prior PET-CT is similar to that examination. Extensive osseous metastatic disease involving the axial and proximal appendicular skeleton with a few areas showing increased sclerosis for instance increased sclerosis and expansion in the sternum on image 89/12 and increased sclerosis involving and anterior T4 vertebral body lesion on image 87/2. Superior endplate deformity at T12 wedging deformity at T10 in wedging deformity at T6 are similar to prior CT. CT ABDOMEN PELVIS FINDINGS Hepatobiliary: Bilobar hepatic metastasis similar recent PET-CT progressed from CT May 11, 2023. For reference: -Ill-defined hypodensity the posterolateral left lobe of the liver measuring 10 mm on image 57/8 -ill-defined hypodensity the anterior liver along the interlobar fissure measuring 12 mm on image 53/8 previously 10 mm Gallbladder is unremarkable.  No biliary ductal dilation. Pancreas: No pancreatic ductal dilation or evidence of acute inflammation. Spleen: No splenomegaly. Adrenals/Urinary Tract: No suspicious adrenal nodule/mass. Kidneys demonstrate symmetric enhancement. No hydronephrosis. Urinary bladder is unremarkable for degree of distension. Stomach/Bowel: Stomach is  unremarkable for degree of distension. No pathologic dilation of small or large bowel. Colonic stool burden compatible with constipation. Vascular/Lymphatic: Normal caliber abdominal aorta. Smooth IVC contours. Indexed right external iliac lymph node measures 6 mm in short axis on image 105/8 previously 7 mm. Reproductive: No suspicious or acute finding. Other: No significant abdominopelvic free fluid. Musculoskeletal: No significant interval change in the mixed lytic and sclerotic osseous metastatic disease throughout the axial and proximal appendicular skeleton.  Previously indexed lesion in the right iliac wing measures 3.9 x 2.8 cm on image 92/8 previously 3.4 x 3.1 cm when remeasured for consistency. IMPRESSION: 1. Bilobar hepatic metastasis are similar to recent PET-CT but progressed from CT May 11, 2023. 2. Extensive osseous metastatic disease involving the axial and proximal appendicular skeleton with a few areas showing increased sclerosis which may reflect treatment response but is nonspecific. Suggest attention on follow-up imaging. 3. Stable right axillary and right external iliac lymph nodes. 4. Subcutaneous/cutaneous nodular soft tissue thickening in the posterior upper back which was hypermetabolic on prior PET-CT is similar to that examination, compatible with disease involvement. 5. Gas fluid levels in a patulous esophagus, suggestive of gastroesophageal reflux. 6. Colonic stool burden compatible with constipation. Electronically Signed   By: Reyes Holder M.D.   On: 11/19/2023 16:12   CT HEAD W & WO CONTRAST ( ) Result Date: 11/17/2023 EXAM: CT HEAD WITHOUT AND WITH CONTRAST 11/17/2023 02:21:09 PM TECHNIQUE: CT of the head was performed without and with the administration of 80mL iohexol  (OMNIPAQUE ) 300 MG/ML solution. Automated exposure control, iterative reconstruction, and/or weight based adjustment of the mA/kV was utilized to reduce the radiation dose to as low as reasonably  achievable. COMPARISON: CT head and CTA head and neck 08/20/2023 CLINICAL HISTORY: Brain/CNS neoplasm, monitor. Restaging Left Breast CA diagnosed in 2019 and she had a Mastectomy with Chemo and rad tx's. 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 Finished chemo tx's every three weeks for the bone mets. Mass behind eye and had radiation; She is currently asymptomatic. NKI Hx Port-a-cath. Former smoker, quit greater than 40 years ago. FINDINGS: BRAIN AND VENTRICLES: No acute intracranial hemorrhage. No mass effect or midline shift. No extra-axial fluid collection. No evidence of acute infarct. No hydrocephalus. No abnormal enhancement. ORBITS: The previously noted soft tissue along the lateral aspect of the right globe may reflect mild prominence of the lacrimal gland. This region of soft tissue is decreased in prominence on the current study. The orbits are otherwise unremarkable. SINUSES AND MASTOIDS: No acute abnormality. SOFT TISSUES AND SKULL: The previously noted areas of soft tissue throughout the scalp are decreased in size and number compared to prior. Heterogeneous lesion involving the right frontal calvarium is similar to prior. There are additional scattered areas of lytic lesions throughout the visualized calvarium which are similar to prior. IMPRESSION: 1. No acute intracranial abnormality. 2. 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 3. Stable heterogeneous lesion involving the right frontal calvarium and additional scattered lytic lesions throughout the visualized calvarium. 4. Decreased soft tissue along the lateral aspect of the right globe, possibly representing mild prominence of the lacrimal gland. The orbits are otherwise unremarkable. Electronically signed by: Donnice Mania MD 11/17/2023 03:02 PM EDT RP Workstation: [id number]     Assessment/Plan Focal seizure (HCC)  Kathryn Lucas is  clinically stable today, now having completed skull RT.  CT head demonstrates considerable improvement of thick pachymeningeal/dural enhancement seen on prior treatment; etiology is/was either reactive from active osseous/calvarial disease or organic spread of neoplasm onto dural structures.    Her seizure risk is likely reduced due to these improvements.  She has been poorly tolerant of Keppra , Vimpat , Zonisamide .  Currently doing well with low dose of Lamictal  25mg  daily.  We are agreeable with continuing that dose for now.  We appreciate the opportunity to participate in the care of CRUZITA LIPA.  We ask that GRETHEL ZENK return to clinic in 6 months following next brain MRI, or sooner as needed.  All questions were answered. The patient knows to call the clinic with any problems, questions or concerns. No barriers to learning were detected.  The total time spent in the encounter was 40 minutes and more than 50% was on counseling and review of test results   Arthea MARLA Manns, MD Medical Director of Neuro-Oncology [facility] 12/03/23 9:39 AM

## 2023-12-06 ENCOUNTER — Ambulatory Visit
Admission: RE | Admit: 2023-12-06 | Discharge: 2023-12-06 | Disposition: A | Discharge status: Home / Self Care | Source: Ambulatory Visit | Attending: Radiation Oncology | Admitting: Radiation Oncology

## 2023-12-06 ENCOUNTER — Encounter: Payer: Self-pay | Admitting: Radiation Oncology

## 2023-12-06 ENCOUNTER — Encounter: Admitting: Physician Assistant

## 2023-12-06 ENCOUNTER — Encounter: Payer: Self-pay | Admitting: Hematology and Oncology

## 2023-12-06 ENCOUNTER — Encounter: Payer: Self-pay | Admitting: Internal Medicine

## 2023-12-06 VITALS — BP 115/77 | HR 106 | Temp 98.0°F | Resp 15 | Ht 70.0 in | Wt 146.0 lb

## 2023-12-06 DIAGNOSIS — C50512 Malignant neoplasm of lower-outer quadrant of left female breast: Secondary | ICD-10-CM | POA: Insufficient documentation

## 2023-12-06 DIAGNOSIS — C787 Secondary malignant neoplasm of liver and intrahepatic bile duct: Secondary | ICD-10-CM | POA: Insufficient documentation

## 2023-12-06 DIAGNOSIS — C7951 Secondary malignant neoplasm of bone: Secondary | ICD-10-CM | POA: Diagnosis present

## 2023-12-06 DIAGNOSIS — C792 Secondary malignant neoplasm of skin: Secondary | ICD-10-CM | POA: Diagnosis not present

## 2023-12-06 DIAGNOSIS — C44599 Other specified malignant neoplasm of skin of other part of trunk: Secondary | ICD-10-CM | POA: Diagnosis not present

## 2023-12-06 DIAGNOSIS — Z923 Personal history of irradiation: Secondary | ICD-10-CM | POA: Insufficient documentation

## 2023-12-06 NOTE — Progress Notes (Signed)
 Radiation Oncology Follow up Note  Name: Kathryn Lucas   Date:   12/06/2023 MRN:  [id number] DOB: 05/10/1960    This 63 y.o. female presents to the clinic today for 1 month follow-up status post palliative radiation therapy to both her skin and calvarium in patient previously treated to her cervical spine for stage IV breast cancer.  REFERRING PROVIDER: Vernon Velna SAUNDERS, MD  HPI: Patient is a 63 year old female now out 1 month after she palliative radiation therapy to both her calvarium as well as her back for metastatic skin lesions from stage IV breast cancer.  She is currently being cared for by the wound clinic both back lesions are healing although there are bandage at this time.  She states she is having no headaches no change in visual fields and no significant pain in her head region..  Recent CT scan of her head showed previously noted parenchymal meningeal thickening not visualized on current study.  Also showing decrease soft tissue along the lateral aspect of the right globe.  She does have on recent imaging also known by lobar hepatic metastasis and extensive osseous metastatic disease involving the axial and proximal appendicular skeleton.  She is not having any new areas of pain at this time.  She is currently being treated withlow dose of Lamictal  25mg  daily.  She is currently receiving physical therapy.  COMPLICATIONS OF TREATMENT: none  FOLLOW UP COMPLIANCE: keeps appointments   PHYSICAL EXAM:  BP 115/77   Pulse (!) 106   Temp 98 F (36.7 C) (Tympanic)   Resp 15   Ht 5' 10 (1.778 m)   Wt 146 lb (66.2 kg)   BMI 20.95 kg/m  She has outpatient the scalp no areas of visible skin reaction.  Neurologic examination essentially unremarkable.  Back lesions are bandaged at this time.  Well-developed well-nourished patient in NAD. HEENT reveals PERLA, EOMI, discs not visualized.  Oral cavity is clear. No oral mucosal lesions are identified. Neck is clear without evidence of  cervical or supraclavicular adenopathy. Lungs are clear to A&P. Cardiac examination is essentially unremarkable with regular rate and rhythm without murmur rub or thrill. Abdomen is benign with no organomegaly or masses noted. Motor sensory and DTR levels are equal and symmetric in the upper and lower extremities. Cranial nerves II through XII are grossly intact. Proprioception is intact. No peripheral adenopathy or edema is identified. No motor or sensory levels are noted. Crude visual fields are within normal range.  RADIOLOGY RESULTS: CT scans chest abdomen pelvis and head all reviewed compatible with above-stated findings  PLAN: At the present time patient is doing fairly well.  At this time I will turn follow-up care over to medical oncology and Dr. Buckley.  I be happy to reevaluate the patient in time the future should that be indicated.  I would like to take this opportunity to thank you for allowing me to participate in the care of your patient.SABRA Marcey Penton, MD

## 2023-12-07 ENCOUNTER — Ambulatory Visit: Admitting: Physical Therapy

## 2023-12-07 DIAGNOSIS — C7951 Secondary malignant neoplasm of bone: Secondary | ICD-10-CM | POA: Diagnosis not present

## 2023-12-07 DIAGNOSIS — M25611 Stiffness of right shoulder, not elsewhere classified: Secondary | ICD-10-CM

## 2023-12-07 DIAGNOSIS — M542 Cervicalgia: Secondary | ICD-10-CM

## 2023-12-07 DIAGNOSIS — M6281 Muscle weakness (generalized): Secondary | ICD-10-CM

## 2023-12-07 DIAGNOSIS — R262 Difficulty in walking, not elsewhere classified: Secondary | ICD-10-CM

## 2023-12-07 DIAGNOSIS — M436 Torticollis: Secondary | ICD-10-CM

## 2023-12-07 DIAGNOSIS — R29898 Other symptoms and signs involving the musculoskeletal system: Secondary | ICD-10-CM

## 2023-12-07 DIAGNOSIS — M25612 Stiffness of left shoulder, not elsewhere classified: Secondary | ICD-10-CM

## 2023-12-07 NOTE — Therapy (Unsigned)
 OUTPATIENT PHYSICAL treatment   Patient Name: Kathryn Lucas MRN: [id number] DOB:Sep 30, 1960, 63 y.o., female Today's Date: 12/07/2023   PCP: Vernon Velna SAUNDERS, MD  REFERRING PROVIDER: Odean Potts, MD   END OF SESSION:  PT End of Session - 12/07/23 1542     Visit Number 2    Number of Visits 16    Date for Recertification  01/19/24    PT Start Time 1537    PT Stop Time 1615    PT Time Calculation (min) 38 min    Equipment Utilized During Treatment Gait belt    Activity Tolerance Patient tolerated treatment well    Behavior During Therapy [facility] for tasks assessed/performed          Past Medical History:  Diagnosis Date   Breast cancer (HCC)    left breast cancer   Cancer (HCC) 09/2017   left breast cancer   Family history of breast cancer    Hypothyroidism    Personal history of radiation therapy 2019   Thyroid  disease    Past Surgical History:  Procedure Laterality Date   BREAST BIOPSY Left 2018   BREAST BIOPSY Left 2019   BREAST RECONSTRUCTION WITH PLACEMENT OF TISSUE EXPANDER AND ALLODERM Left 10/26/2017   Procedure: LEFT BREAST RECONSTRUCTION WITH PLACEMENT OF TISSUE EXPANDER AND ALLODERM;  Surgeon: Arelia Filippo, MD;  Location: [facility];  Service: Plastics;  Laterality: Left;   IR IMAGING GUIDED PORT INSERTION  04/27/2023   MASTECTOMY Left 2019   MASTECTOMY WITH RADIOACTIVE SEED GUIDED EXCISION AND AXILLARY SENTINEL LYMPH NODE BIOPSY Left 10/26/2017   Procedure: LEFT MASTECTOMY WITH SEED TARGETED  LEFT AXILLARY LYMPH NODE EXCISION AND LEFT SENTINEL LYMPH NODE BIOPSY;  Surgeon: Aron Shoulders, MD;  Location: [facility];  Service: General;  Laterality: Left;   TONSILLECTOMY     WISDOM TOOTH EXTRACTION     Patient Active Problem List   Diagnosis Date Noted   Focal seizure (HCC) 09/02/2023   Metastatic 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) 10/14/2022   Elevated liver enzymes 10/14/2022   Unexplained weight loss 10/14/2022   Neoplasm related  pain 10/14/2022   Metastatic malignant neoplasm (HCC) 12/19/2021   Family history of breast cancer    History of therapeutic radiation 04/27/2018   Breast cancer, left breast (HCC) 10/26/2017   Malignant neoplasm of lower-outer quadrant of left breast of female, estrogen receptor positive (HCC) 07/15/2016   Plantar fasciitis 07/30/2015   Metatarsalgia 10/18/2014    ONSET DATE: September 2023   REFERRING DIAG:  Diagnosis  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  Having a little pain In the R shoulder after trying to pick some groceries today. No other pain today.  Was cleared not to see oncologist for 3 months. Was found to have some   From Eval: Pt arrives at PT after prolonged hiatus from PT due to progress reports at that time. Reports that she  had restarted CA treatment via radiation to address metastases of skull and Spine. Completed radiation therapy  in September. Scans show improvement in cerebral metastases .   Pt reports that she feels like her legs and arms are weaker now after completing recent Radiation treatment. Wants to be able to stand in one place for increased time.   Pt accompanied by: none.   PERTINENT HISTORY:   Kathryn Lucas is a 63 year old female with metastatic cancer who presents for follow-up on her recent CT scans and lab results.   Her recent CT scans show stable  metastatic lesions in the liver, bones, lymph nodes, and soft tissue thickening in the back, with resolution of skull lesions.   Pt had exacerbation of s/s in June of 2024 with hospitalization and subsequent change in oncology treatment limiting ability to attend PT. Pt reports recent decline again in function following radiation in summer 2025.   PAIN:  Are you having pain? No  PRECAUTIONS: Cervical and Other: cervical for comfort   RED FLAGS: None   WEIGHT BEARING RESTRICTIONS: No  FALLS: Has patient fallen in last 6 months? Yes. Number of falls 1  LIVING ENVIRONMENT: Lives with: lives alone Lives in: House/apartment Stairs: yes, but uses ramp  Has following equipment at home: Walker - 4 wheeled  PLOF: Needs assistance with ADLs and Needs assistance with homemaking  PATIENT GOALS: improve balance, strength and endurance. Stand at kitchen longer and   OBJECTIVE:   DIAGNOSTIC FINDINGS:  11/17/2023  CT head IMPRESSION: 1. No acute intracranial abnormality. 2. 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 3. Stable heterogeneous lesion involving the right frontal calvarium and additional scattered lytic lesions throughout the visualized calvarium. 4. Decreased soft tissue along the lateral aspect of the right globe, possibly representing mild prominence of the lacrimal gland. The orbits are otherwise unremarkable.  CT chest/abdomin  IMPRESSION: 1. Bilobar hepatic metastasis are similar to recent PET-CT but progressed from CT May 11, 2023. 2. Extensive osseous metastatic disease involving the axial and proximal appendicular skeleton with a few areas showing increased sclerosis which may reflect treatment response but is nonspecific. Suggest attention on follow-up imaging. 3. Stable right axillary and right external iliac lymph nodes. 4. Subcutaneous/cutaneous nodular soft tissue  thickening in the posterior upper back which was hypermetabolic on prior PET-CT is similar to that examination, compatible with disease involvement. 5. Gas fluid levels in a patulous esophagus, suggestive of gastroesophageal reflux. 6. Colonic stool burden compatible with constipation.   7/2 PET  IMPRESSION: 1. Overall disease progression as evidenced by increase in the hypermetabolic right axillary lymph nodes, new hypermetabolic mediastinal adenopathy and new hepatic metastases. 2. New left cervical level II lymph node, worrisome for metastatic disease. 3. Minimal improvement in right external iliac adenopathy. 4. Overall minimal improvement in osseous metastatic disease.   6/6:  Head CT  IMPRESSION: 1. No acute intracranial abnormality. 2. Extensive, predominantly stable areas of osseous metastasis throughout the calvarium. 3. New area of enhancing soft tissue attenuation along the lateral aspect of the right globe,  as described above. MRI correlation is recommended. 4. Multiple areas of soft tissue attenuation throughout the scalp, mildly increased in size and number when compared to the prior study. Mild interval progression of metastatic disease cannot be excluded.    COGNITION: Overall cognitive status: Within functional limits for tasks assessed   SENSATION: WFL  COORDINATION: Limited due to ROM deficits. WFL    MUSCLE TONE: WFL    POSTURE: rounded shoulders, forward head, and decreased lumbar lordosis  LOWER EXTREMITY MMT:   MMT  Right Eval Left Eval  Hip flexion 4 4  Hip extension    Hip abduction 4 4  Hip adduction 4+ 4+  Hip internal rotation    Hip external rotation    Knee flexion 4+ 4+  Knee extension 4+ 4+  Ankle dorsiflexion 5 5  Ankle plantarflexion 4 4  Ankle inversion    Ankle eversion     (Blank rows = not tested) MMT  Right Eval Left Eval  Shoulder  flexion 4- 4  shoulder extension 4+ 4+  Shoulder abduction 4 4  Shoulder  adduction 4+ 4+  Shoulder  internal rotation 4- 4  Shoulder external rotation 4+ 4+  Elbow flexion 4 4+  Elbow extension 4+ 4+                    LOWER EXTREMITY ROM:     Limited shoulder flexion and abduction to ~ 85 DEG  on the R shoulder   BED MOBILITY:  Sit to supine SBA Supine to sit SBA  TRANSFERS: Assistive device utilized: None  Sit to stand: Modified independence Stand to sit: Modified independence Chair to chair: Modified independence Floor: Modified independence   CURB:  Level of Assistance: CGA Assistive device utilized: Youth worker Comments: step to pattern  STAIRS: Level of Assistance: CGA Stair Negotiation Technique: Step to Pattern with Bilateral Rails Number of Stairs: 4  Height of Stairs: 6  Comments: step to with BUE support   GAIT: Gait pattern: step through pattern genu recurvatum on the RLE.   Distance walked: 176ft Assistive device utilized: None Level of assistance: SBA Comments: no circumduction, greatly improved since last bout of PT treatment.   FUNCTIONAL TESTS:  5 times sit to stand: 16.57 sec with UE support   Timed up and go (TUG): 9.4 sec   6 minute walk test: 1090 10 meter walk test:  9.55 sec (1.34m/s)  Berg Balance Scale: 47/56 Functional gait assessment: 19/30   PATIENT SURVEYS:  ABC scale: The Activities-Specific Balance Confidence (ABC) Scale 0% 10 20 30  40 50 60 70 80 90 100% No confidence<->completely confident  "How confident are you that you will not lose your balance or become unsteady when you . . .   Date tested 11/24/23   Walk around the house 100%  2. Walk up or down stairs 100%  3. Bend over and pick up a slipper from in front of a closet floor 100%  4. Reach for a small can off a shelf at eye level 100%  5. Stand on tip toes and reach for something above your head 100%  6. Stand on a chair and reach for something 60%  7. Sweep the floor 100%  8. Walk outside the house to a car parked in the driveway  100%  9. Get into or out of a car 100%  10. Walk across a parking lot to the mall 100%  11. Walk up or down a ramp 100%  12.  Walk in a crowded mall where people rapidly walk past you 100%  13. Are bumped into by people as you walk through the mall 80%  14. Step onto or off of an escalator while you are holding onto the railing 100%  15. Step onto or off an escalator while holding onto parcels such that you cannot hold onto the railing 80%  16. Walk outside on icy sidewalks 70%  Total: #/16  93%     [facility] PT Assessment - 12/08/23 0001       Assessment   Hand Dominance Right      Berg Balance Test   Sit to Stand Able to stand  independently using hands    Standing Unsupported Able to stand safely 2 minutes    Sitting with Back Unsupported but Feet Supported on Floor or Stool Able to sit safely and securely 2 minutes    Stand to Sit Controls descent by using hands    Transfers Able to transfer safely, definite need of hands    Standing Unsupported with Eyes Closed Able to stand 10 seconds safely    Standing Unsupported with Feet Together Able to place feet together independently and stand 1 minute safely    From Standing, Reach Forward with Outstretched Arm Can reach forward >12 cm safely (5)    From Standing Position, Pick up Object from Floor Able to pick up shoe safely and easily    From Standing Position, Turn to Look Behind Over each Shoulder Looks behind one side only/other side shows less weight shift    Turn 360 Degrees Able to turn 360 degrees safely one side only in 4 seconds or less    Standing Unsupported, Alternately Place Feet on Step/Stool Able to stand independently and safely and complete 8 steps in 20 seconds    Standing Unsupported, One Foot in Front Able to plae foot ahead of the other independently and hold 30 seconds    Standing on One Leg Able to lift leg independently and hold equal to or more than 3 seconds      Functional Gait  Assessment   Gait Level Surface  Walks 20 ft in less than 5.5 sec, no assistive devices, good speed, no evidence for imbalance, normal gait pattern, deviates no more than 6 in outside of the 12 in walkway width.    Change in Gait Speed Able to smoothly change walking speed without loss of balance or gait deviation. Deviate no more than 6 in outside of the 12 in walkway width.    Gait with Horizontal Head Turns Performs head turns smoothly with slight change in gait velocity (eg, minor disruption to smooth gait path), deviates 6-10 in outside 12 in walkway width, or uses an assistive device.    Gait with Vertical Head Turns Performs head turns with no change in gait. Deviates no more than 6 in outside 12 in walkway width.    Gait and Pivot Turn Pivot turns safely within 3 sec and stops quickly with no loss of balance.    Step Over Obstacle Is able to step over one shoe box (4.5 in total height) but must slow down and adjust steps to clear box safely. May require verbal cueing.    Gait with Narrow Base of Support Ambulates less than 4 steps heel to toe or cannot perform without assistance.    Gait with Eyes Closed Walks 20 ft, slow speed, abnormal gait pattern, evidence for imbalance, deviates 10-15 in outside 12 in walkway  width. Requires more than 9 sec to ambulate 20 ft.    Ambulating Backwards Walks 20 ft, slow speed, abnormal gait pattern, evidence for imbalance, deviates 10-15 in outside 12 in walkway width.    Steps Alternating feet, must use rail.          TODAY'S TREATMENT:                                                                                                                              DATE: 12/07/2023   PT instructed pt in BLE/BUENustep reciprocal activity tolerance and AAROM; rolling hill program x 7 min; was able to tolerate 3.5 min with use of R shoulder, then performed with LUE and BLE.   6 Min Walk Test:  Instructed patient to ambulate as quickly and as safely as possible for 6 minutes using LRAD.  Patient was allowed to take standing rest breaks without stopping the test, but if the patient required a sitting rest break the clock would be stopped and the test would be over.  Results: 1090 feet using no AD with supervision assist; mild SOB upon completion. Results indicate that the patient has reduced endurance with ambulation compared to age matched norms.  Age Matched Norms: 14-69 yo M: 3 F: 9, 74-79 yo M: 52 F: 471, 54-89 yo M: 417 F: 392 MDC: 58.21 meters (190.98 feet) or 50 meters (ANPTA Core Set of Outcome Measures for Adults with Neurologic Conditions, 2018)  Patient demonstrates increased fall risk as noted by score of   47/56 on Berg Balance Scale.  (<36= high risk for falls, close to 100%; 37-45 significant >80%; 46-51 moderate >50%; 52-55 lower >25%)    PATIENT EDUCATION: Education details: Pt educated throughout session about proper posture and technique with exercises. Improved exercise technique, movement at target joints, use of target muscles after min to mod verbal, visual, tactile cues.  Person educated: Patient Education method: Explanation Education comprehension: verbalized understanding  HOME EXERCISE PROGRAM:  Access Code: [email or web address] URL: [email or web address] Date: 12/08/2023 Prepared by: Massie Dollar  Exercises - Sit to Stand with Armchair  - 1 x daily - 7 x weekly - 3 sets - 10 reps - Seated Long Arc Quad  - 1 x daily - 7 x weekly - 3 sets - 10 reps - Standing March with Counter Support  - 1 x daily - 7 x weekly - 3 sets - 10 reps - Seated March  - 1 x daily - 7 x weekly - 3 sets - 10 reps - Side Stepping with Counter Support  - 1 x daily - 7 x weekly - 3 sets - 10 reps - Seated Heel Raise  - 1 x daily - 7 x weekly - 3 sets - 10 reps - Seated Toe Raise  - 1 x daily - 7 x weekly - 3 sets - 10 repsit.   GOALS: Goals reviewed with patient? Yes  SHORT TERM GOALS: Target date: 12/22/2023  Patient will be independent in home exercise  program to improve strength/mobility for better functional independence with ADLs. Baseline: to provide at session 1 or 2  Goal status: INITIAL   LONG TERM GOALS: Target date: 01/19/2024    Patient will increase LEFS score to equal to or greater than 8 points   to demonstrate statistically significant improvement in mobility and quality of life.  Baseline:  to be completed   Goal status: INITIAL  2.  Patient (> 43 years old) will complete five times sit to stand test in < 15 seconds without use of BUE indicating an increased LE strength and improved balance. Baseline: 16.57 sec with UE supported on arm rest  Goal status: INITIAL  3.  Patient will increase Berg Balance score by > 6 points to demonstrate decreased fall risk during functional activities Baseline: 47/56 Goal status: INITIAL  4.  Patient will increase 6 min walk test to >1273ft as to improve gait speed for better community ambulation and to reduce fall risk. Baseline:1020ft Goal status: INITIAL    5.  Patient will ascend and descent stairs without UE support to improve independence with access to home and community   Goal status: INITIAL  6.  Patient will increase FGA score to >22 as to demonstrate reduced fall risk and improved dynamic gait balance for better safety with community/home ambulation. Baseline:19 Goal status: INITIAL   ASSESSMENT:  CLINICAL IMPRESSION: Pt put forth excellent effort for PT treatment to  address functional deficits listed above secondary to continued CA treatment.  Continued functional assessment through [facility] AND 6 min walk test, indicating moderate fall risk and reduced community access compared to aged matched norms. HEP provided; will conotnue to assess and adjust as appropriate. Pt will benefit from skilled PT pending additional authorization for PT treatment  to address balance, strength and ROM deficits, improve safety and QoL while continuing CA treatment.    OBJECTIVE IMPAIRMENTS:  Abnormal gait, cardiopulmonary status limiting activity, decreased activity tolerance, decreased balance, decreased endurance, decreased knowledge of condition, decreased mobility, difficulty walking, decreased ROM, decreased strength, hypomobility, increased fascial restrictions, impaired perceived functional ability, impaired flexibility, impaired UE functional use, improper body mechanics, postural dysfunction, and pain.   ACTIVITY LIMITATIONS: carrying, lifting, bending, standing, squatting, sleeping, stairs, transfers, bathing, toileting, dressing, reach over head, and locomotion level  PARTICIPATION LIMITATIONS: cleaning, laundry, driving, shopping, community activity, and yard work  PERSONAL FACTORS: Age, Behavior pattern, Fitness, Past/current experiences, and 1-2 comorbidities: CA with profound metastases  are also affecting patient's functional outcome.   REHAB POTENTIAL: Good  CLINICAL DECISION MAKING: Evolving/moderate complexity  EVALUATION COMPLEXITY: Moderate  PLAN:  PT FREQUENCY: 1-2x/week  PT DURATION: 8 weeks  PLANNED INTERVENTIONS: Therapeutic exercises, Therapeutic activity, Neuromuscular re-education, Balance training, Gait training, Patient/Family education, Self Care, Joint mobilization, Stair training, DME instructions, Cryotherapy, Moist heat, and Manual therapy  PLAN FOR NEXT SESSION:   HEP review/expansion UE/cervical ROM as appropriate.     Massie FORBES Dollar, PT 12/07/2023, 3:43 PM

## 2023-12-09 ENCOUNTER — Other Ambulatory Visit (HOSPITAL_COMMUNITY): Payer: Self-pay

## 2023-12-12 ENCOUNTER — Encounter (INDEPENDENT_AMBULATORY_CARE_PROVIDER_SITE_OTHER): Payer: Self-pay

## 2023-12-13 ENCOUNTER — Other Ambulatory Visit: Payer: Self-pay | Admitting: Hematology and Oncology

## 2023-12-13 ENCOUNTER — Other Ambulatory Visit: Payer: Self-pay

## 2023-12-13 NOTE — Progress Notes (Signed)
 Specialty Pharmacy Refill Coordination Note  Kathryn Lucas is a 63 y.o. female contacted today regarding refills of specialty medication(s) Capecitabine  (XELODA )   Patient requested [facility] at [facility] [place] date: 12/13/23   Medication will be filled on 12/13/23.

## 2023-12-14 ENCOUNTER — Encounter: Admitting: Physician Assistant

## 2023-12-14 DIAGNOSIS — C44599 Other specified malignant neoplasm of skin of other part of trunk: Secondary | ICD-10-CM | POA: Diagnosis not present

## 2023-12-15 ENCOUNTER — Ambulatory Visit: Attending: Hematology and Oncology | Admitting: Physical Therapy

## 2023-12-15 DIAGNOSIS — M25611 Stiffness of right shoulder, not elsewhere classified: Secondary | ICD-10-CM | POA: Diagnosis present

## 2023-12-15 DIAGNOSIS — M6281 Muscle weakness (generalized): Secondary | ICD-10-CM | POA: Insufficient documentation

## 2023-12-15 DIAGNOSIS — R29898 Other symptoms and signs involving the musculoskeletal system: Secondary | ICD-10-CM | POA: Insufficient documentation

## 2023-12-15 DIAGNOSIS — R262 Difficulty in walking, not elsewhere classified: Secondary | ICD-10-CM | POA: Diagnosis present

## 2023-12-15 DIAGNOSIS — M542 Cervicalgia: Secondary | ICD-10-CM | POA: Diagnosis present

## 2023-12-15 DIAGNOSIS — M436 Torticollis: Secondary | ICD-10-CM | POA: Insufficient documentation

## 2023-12-15 DIAGNOSIS — M25612 Stiffness of left shoulder, not elsewhere classified: Secondary | ICD-10-CM | POA: Diagnosis present

## 2023-12-15 NOTE — Therapy (Signed)
 OUTPATIENT PHYSICAL treatment   Patient Name: Kathryn Lucas MRN: [id number] DOB:1960-05-26, 63 y.o., female Today's Date: 12/15/2023   PCP: Vernon Velna SAUNDERS, MD  REFERRING PROVIDER: Odean Potts, MD   END OF SESSION:  PT End of Session - 12/15/23 1322     Visit Number 3    Number of Visits 16    Date for Recertification  01/19/24    PT Start Time 1324    PT Stop Time 1400    PT Time Calculation (min) 36 min    Equipment Utilized During Treatment Gait belt    Activity Tolerance Patient tolerated treatment well    Behavior During Therapy WFL for tasks assessed/performed          Past Medical History:  Diagnosis Date   Breast cancer (HCC)    left breast cancer   Cancer (HCC) 09/2017   left breast cancer   Family history of breast cancer    Hypothyroidism    Personal history of radiation therapy 2019   Thyroid  disease    Past Surgical History:  Procedure Laterality Date   BREAST BIOPSY Left 2018   BREAST BIOPSY Left 2019   BREAST RECONSTRUCTION WITH PLACEMENT OF TISSUE EXPANDER AND ALLODERM Left 10/26/2017   Procedure: LEFT BREAST RECONSTRUCTION WITH PLACEMENT OF TISSUE EXPANDER AND ALLODERM;  Surgeon: Arelia Filippo, MD;  Location: [facility];  Service: Plastics;  Laterality: Left;   IR IMAGING GUIDED PORT INSERTION  04/27/2023   MASTECTOMY Left 2019   MASTECTOMY WITH RADIOACTIVE SEED GUIDED EXCISION AND AXILLARY SENTINEL LYMPH NODE BIOPSY Left 10/26/2017   Procedure: LEFT MASTECTOMY WITH SEED TARGETED  LEFT AXILLARY LYMPH NODE EXCISION AND LEFT SENTINEL LYMPH NODE BIOPSY;  Surgeon: Aron Shoulders, MD;  Location: [facility];  Service: General;  Laterality: Left;   TONSILLECTOMY     WISDOM TOOTH EXTRACTION     Patient Active Problem List   Diagnosis Date Noted   Focal seizure (HCC) 09/02/2023   Metastatic 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) 10/14/2022   Elevated liver enzymes 10/14/2022   Unexplained weight loss 10/14/2022   Neoplasm related  pain 10/14/2022   Metastatic malignant neoplasm (HCC) 12/19/2021   Family history of breast cancer    History of therapeutic radiation 04/27/2018   Breast cancer, left breast (HCC) 10/26/2017   Malignant neoplasm of lower-outer quadrant of left breast of female, estrogen receptor positive (HCC) 07/15/2016   Plantar fasciitis 07/30/2015   Metatarsalgia 10/18/2014    ONSET DATE: September 2023   REFERRING DIAG:  Diagnosis  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  Pt states that she has no pain at start of PT. R shoulder and neck are still feeling tight, but better than last session.   From Eval: Pt arrives at PT after prolonged hiatus from PT due to progress reports at that time. Reports that she  had restarted CA treatment via radiation to address metastases of skull and Spine. Completed radiation therapy  in September. Scans show improvement in cerebral metastases .   Pt reports that she feels like her legs and arms are weaker now after completing recent Radiation treatment. Wants to be able to stand in one place for increased time.   Pt accompanied by: none.   PERTINENT HISTORY:   Kathryn Lucas is a 63 year old female with metastatic cancer who presents for follow-up on her recent CT scans and lab results.   Her recent CT scans show stable metastatic lesions in the liver, bones, lymph nodes, and soft  tissue thickening in the back, with resolution of skull lesions.   Pt had exacerbation of s/s in June of 2024 with hospitalization and subsequent change in oncology treatment limiting ability to attend PT. Pt reports recent decline again in function following radiation in summer 2025.   PAIN:  Are you having pain? No  PRECAUTIONS: Cervical and Other: cervical for comfort   RED FLAGS: None   WEIGHT BEARING RESTRICTIONS: No  FALLS: Has patient fallen in last 6 months? Yes. Number of falls 1  LIVING ENVIRONMENT: Lives with: lives alone Lives in: House/apartment Stairs: yes, but uses ramp  Has following equipment at home: Walker - 4 wheeled  PLOF: Needs assistance with ADLs and Needs assistance with homemaking  PATIENT GOALS: improve balance, strength and endurance. Stand at kitchen longer and   OBJECTIVE:   DIAGNOSTIC FINDINGS:  11/17/2023  CT head IMPRESSION: 1. No acute intracranial abnormality. 2. 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 3. Stable heterogeneous lesion involving the right frontal calvarium and additional scattered lytic lesions throughout the visualized calvarium. 4. Decreased soft tissue along the lateral aspect of the right globe, possibly representing mild prominence of the lacrimal gland. The orbits are otherwise unremarkable.  CT chest/abdomin  IMPRESSION: 1. Bilobar hepatic metastasis are similar to recent PET-CT but progressed from CT May 11, 2023. 2. Extensive osseous metastatic disease involving the axial and proximal appendicular skeleton with a few areas showing increased sclerosis which may reflect treatment response but is nonspecific. Suggest attention on follow-up imaging. 3. Stable right axillary and right external iliac lymph nodes. 4. Subcutaneous/cutaneous nodular soft tissue thickening in the posterior upper back which was  hypermetabolic on prior PET-CT is similar to that examination, compatible with disease involvement. 5. Gas fluid levels in a patulous esophagus, suggestive of gastroesophageal reflux. 6. Colonic stool burden compatible with constipation.   7/2 PET  IMPRESSION: 1. Overall disease progression as evidenced by increase in the hypermetabolic right axillary lymph nodes, new hypermetabolic mediastinal adenopathy and new hepatic metastases. 2. New left cervical level II lymph node, worrisome for metastatic disease. 3. Minimal improvement in right external iliac adenopathy. 4. Overall minimal improvement in osseous metastatic disease.   6/6:  Head CT  IMPRESSION: 1. No acute intracranial abnormality. 2. Extensive, predominantly stable areas of osseous metastasis throughout the calvarium. 3. New area of enhancing soft tissue attenuation along the lateral aspect of the right globe, as described above. MRI correlation is recommended. 4. Multiple areas  of soft tissue attenuation throughout the scalp, mildly increased in size and number when compared to the prior study. Mild interval progression of metastatic disease cannot be excluded.    COGNITION: Overall cognitive status: Within functional limits for tasks assessed   SENSATION: WFL  COORDINATION: Limited due to ROM deficits. WFL    MUSCLE TONE: WFL    POSTURE: rounded shoulders, forward head, and decreased lumbar lordosis  LOWER EXTREMITY MMT:   MMT  Right Eval Left Eval  Hip flexion 4 4  Hip extension    Hip abduction 4 4  Hip adduction 4+ 4+  Hip internal rotation    Hip external rotation    Knee flexion 4+ 4+  Knee extension 4+ 4+  Ankle dorsiflexion 5 5  Ankle plantarflexion 4 4  Ankle inversion    Ankle eversion     (Blank rows = not tested) MMT  Right Eval Left Eval  Shoulder  flexion 4- 4  shoulder extension 4+ 4+  Shoulder abduction 4 4  Shoulder adduction 4+ 4+  Shoulder  internal rotation 4- 4   Shoulder external rotation 4+ 4+  Elbow flexion 4 4+  Elbow extension 4+ 4+                    LOWER EXTREMITY ROM:     Limited shoulder flexion and abduction to ~ 85 DEG  on the R shoulder   BED MOBILITY:  Sit to supine SBA Supine to sit SBA  TRANSFERS: Assistive device utilized: None  Sit to stand: Modified independence Stand to sit: Modified independence Chair to chair: Modified independence Floor: Modified independence   CURB:  Level of Assistance: CGA Assistive device utilized: Youth worker Comments: step to pattern  STAIRS: Level of Assistance: CGA Stair Negotiation Technique: Step to Pattern with Bilateral Rails Number of Stairs: 4  Height of Stairs: 6  Comments: step to with BUE support   GAIT: Gait pattern: step through pattern genu recurvatum on the RLE.   Distance walked: 174ft Assistive device utilized: None Level of assistance: SBA Comments: no circumduction, greatly improved since last bout of PT treatment.   FUNCTIONAL TESTS:  5 times sit to stand: 16.57 sec with UE support   Timed up and go (TUG): 9.4 sec   6 minute walk test: 1090 10 meter walk test:  9.55 sec (1.6m/s)  Berg Balance Scale: 47/56 Functional gait assessment: 19/30   PATIENT SURVEYS:  ABC scale: The Activities-Specific Balance Confidence (ABC) Scale 0% 10 20 30  40 50 60 70 80 90 100% No confidence<->completely confident  "How confident are you that you will not lose your balance or become unsteady when you . . .   Date tested 11/24/23   Walk around the house 100%  2. Walk up or down stairs 100%  3. Bend over and pick up a slipper from in front of a closet floor 100%  4. Reach for a small can off a shelf at eye level 100%  5. Stand on tip toes and reach for something above your head 100%  6. Stand on a chair and reach for something 60%  7. Sweep the floor 100%  8. Walk outside the house to a car parked in the driveway 100%  9. Get into or out of a car 100%  10. Walk  across a parking lot to the mall 100%  11. Walk up or down a ramp 100%  12. Walk in a crowded mall where people rapidly walk past  you 100%  13. Are bumped into by people as you walk through the mall 80%  14. Step onto or off of an escalator while you are holding onto the railing 100%  15. Step onto or off an escalator while holding onto parcels such that you cannot hold onto the railing 80%  16. Walk outside on icy sidewalks 70%  Total: #/16  93%     [facility] PT Assessment - 12/08/23 0001       Assessment   Hand Dominance Right      Berg Balance Test   Sit to Stand Able to stand  independently using hands    Standing Unsupported Able to stand safely 2 minutes    Sitting with Back Unsupported but Feet Supported on Floor or Stool Able to sit safely and securely 2 minutes    Stand to Sit Controls descent by using hands    Transfers Able to transfer safely, definite need of hands    Standing Unsupported with Eyes Closed Able to stand 10 seconds safely    Standing Unsupported with Feet Together Able to place feet together independently and stand 1 minute safely    From Standing, Reach Forward with Outstretched Arm Can reach forward >12 cm safely (5)    From Standing Position, Pick up Object from Floor Able to pick up shoe safely and easily    From Standing Position, Turn to Look Behind Over each Shoulder Looks behind one side only/other side shows less weight shift    Turn 360 Degrees Able to turn 360 degrees safely one side only in 4 seconds or less    Standing Unsupported, Alternately Place Feet on Step/Stool Able to stand independently and safely and complete 8 steps in 20 seconds    Standing Unsupported, One Foot in Front Able to plae foot ahead of the other independently and hold 30 seconds    Standing on One Leg Able to lift leg independently and hold equal to or more than 3 seconds      Functional Gait  Assessment   Gait Level Surface Walks 20 ft in less than 5.5 sec, no assistive  devices, good speed, no evidence for imbalance, normal gait pattern, deviates no more than 6 in outside of the 12 in walkway width.    Change in Gait Speed Able to smoothly change walking speed without loss of balance or gait deviation. Deviate no more than 6 in outside of the 12 in walkway width.    Gait with Horizontal Head Turns Performs head turns smoothly with slight change in gait velocity (eg, minor disruption to smooth gait path), deviates 6-10 in outside 12 in walkway width, or uses an assistive device.    Gait with Vertical Head Turns Performs head turns with no change in gait. Deviates no more than 6 in outside 12 in walkway width.    Gait and Pivot Turn Pivot turns safely within 3 sec and stops quickly with no loss of balance.    Step Over Obstacle Is able to step over one shoe box (4.5 in total height) but must slow down and adjust steps to clear box safely. May require verbal cueing.    Gait with Narrow Base of Support Ambulates less than 4 steps heel to toe or cannot perform without assistance.    Gait with Eyes Closed Walks 20 ft, slow speed, abnormal gait pattern, evidence for imbalance, deviates 10-15 in outside 12 in walkway width. Requires more than 9 sec to ambulate 20 ft.  Ambulating Backwards Walks 20 ft, slow speed, abnormal gait pattern, evidence for imbalance, deviates 10-15 in outside 12 in walkway width.    Steps Alternating feet, must use rail.          TODAY'S TREATMENT:                                                                                                                              DATE: 12/15/2023   PT instructed pt in BLE/BUE Nustep reciprocal activity tolerance and AAROM; level 1-4 x 6 min; was able to tolerate 3 min with use of R shoulder, then performed with LUE and BLE.   Forward lunge to second step stairs x 12 bil   Lateral step up x 10 bil  Light UE support progressing to No UE support after first 2 reps of each bout.    Partial squat with  BUE support on rail for AAROM into shoulder flexion x 10  Forward lunge with BUE support on rails for AAROM into shoulder extension/horz Abduction x 10 bil   Sit<>stand x 5 with CGA-min assist from PT for safety and improved anterior weight shift.   Seated STM to R side UT, paraspinals and deltiod x 9 min total with gentle TP release in paraspinals for noted trigger point.    PATIENT EDUCATION: Education details: Pt educated throughout session about proper posture and technique with exercises. Improved exercise technique, movement at target joints, use of target muscles after min to mod verbal, visual, tactile cues.  Person educated: Patient Education method: Explanation Education comprehension: verbalized understanding  HOME EXERCISE PROGRAM:  Access Code: [email or web address] URL: [email or web address] Date: 12/08/2023 Prepared by: Massie Dollar  Exercises - Sit to Stand with Armchair  - 1 x daily - 7 x weekly - 3 sets - 10 reps - Seated Long Arc Quad  - 1 x daily - 7 x weekly - 3 sets - 10 reps - Standing March with Counter Support  - 1 x daily - 7 x weekly - 3 sets - 10 reps - Seated March  - 1 x daily - 7 x weekly - 3 sets - 10 reps - Side Stepping with Counter Support  - 1 x daily - 7 x weekly - 3 sets - 10 reps - Seated Heel Raise  - 1 x daily - 7 x weekly - 3 sets - 10 reps - Seated Toe Raise  - 1 x daily - 7 x weekly - 3 sets - 10 repsit.   GOALS: Goals reviewed with patient? Yes  SHORT TERM GOALS: Target date: 12/22/2023   Patient will be independent in home exercise program to improve strength/mobility for better functional independence with ADLs. Baseline: to provide at session 1 or 2  Goal status: INITIAL   LONG TERM GOALS: Target date: 01/19/2024    Patient will increase LEFS score to equal to or greater than 8 points   to demonstrate statistically significant improvement in mobility and  quality of life.  Baseline:  to be completed   Goal status:  INITIAL  2.  Patient (> 53 years old) will complete five times sit to stand test in < 15 seconds without use of BUE indicating an increased LE strength and improved balance. Baseline: 16.57 sec with UE supported on arm rest  Goal status: INITIAL  3.  Patient will increase Berg Balance score by > 6 points to demonstrate decreased fall risk during functional activities Baseline: 47/56 Goal status: INITIAL  4.  Patient will increase 6 min walk test to >1256ft as to improve gait speed for better community ambulation and to reduce fall risk. Baseline:1063ft Goal status: INITIAL    5.  Patient will ascend and descent stairs without UE support to improve independence with access to home and community   Goal status: INITIAL  6.  Patient will increase FGA score to >22 as to demonstrate reduced fall risk and improved dynamic gait balance for better safety with community/home ambulation. Baseline:19 Goal status: INITIAL   ASSESSMENT:  CLINICAL IMPRESSION: Pt put forth excellent effort for PT treatment to  address functional deficits listed above secondary to continued CA treatment.  PT treatment focused on BLE strength, gentle AAROM for BUE and STM for pain relief in the R shoulder and Cspine. Was able to demonstrate improved ability to stand without UE support on this day, requiring only CGA to achieve stand initially.   Pt will benefit from skilled PT treatment  to address balance, strength and ROM deficits, improve safety and QoL while continuing CA treatment.    OBJECTIVE IMPAIRMENTS: Abnormal gait, cardiopulmonary status limiting activity, decreased activity tolerance, decreased balance, decreased endurance, decreased knowledge of condition, decreased mobility, difficulty walking, decreased ROM, decreased strength, hypomobility, increased fascial restrictions, impaired perceived functional ability, impaired flexibility, impaired UE functional use, improper body mechanics, postural dysfunction,  and pain.   ACTIVITY LIMITATIONS: carrying, lifting, bending, standing, squatting, sleeping, stairs, transfers, bathing, toileting, dressing, reach over head, and locomotion level  PARTICIPATION LIMITATIONS: cleaning, laundry, driving, shopping, community activity, and yard work  PERSONAL FACTORS: Age, Behavior pattern, Fitness, Past/current experiences, and 1-2 comorbidities: CA with profound metastases  are also affecting patient's functional outcome.   REHAB POTENTIAL: Good  CLINICAL DECISION MAKING: Evolving/moderate complexity  EVALUATION COMPLEXITY: Moderate  PLAN:  PT FREQUENCY: 1-2x/week  PT DURATION: 8 weeks  PLANNED INTERVENTIONS: Therapeutic exercises, Therapeutic activity, Neuromuscular re-education, Balance training, Gait training, Patient/Family education, Self Care, Joint mobilization, Stair training, DME instructions, Cryotherapy, Moist heat, and Manual therapy  PLAN FOR NEXT SESSION:   HEP review/expansion UE/cervical ROM as appropriate.     Massie FORBES Dollar, PT 12/15/2023, 1:23 PM

## 2023-12-21 ENCOUNTER — Encounter: Attending: Physician Assistant | Admitting: Physician Assistant

## 2023-12-21 ENCOUNTER — Encounter: Payer: Self-pay | Admitting: Hematology and Oncology

## 2023-12-21 DIAGNOSIS — X58XXXA Exposure to other specified factors, initial encounter: Secondary | ICD-10-CM | POA: Insufficient documentation

## 2023-12-21 DIAGNOSIS — L988 Other specified disorders of the skin and subcutaneous tissue: Secondary | ICD-10-CM | POA: Insufficient documentation

## 2023-12-21 DIAGNOSIS — L98492 Non-pressure chronic ulcer of skin of other sites with fat layer exposed: Secondary | ICD-10-CM | POA: Diagnosis not present

## 2023-12-21 DIAGNOSIS — L0211 Cutaneous abscess of neck: Secondary | ICD-10-CM | POA: Diagnosis not present

## 2023-12-21 DIAGNOSIS — T66XXXA Radiation sickness, unspecified, initial encounter: Secondary | ICD-10-CM | POA: Insufficient documentation

## 2023-12-21 DIAGNOSIS — E038 Other specified hypothyroidism: Secondary | ICD-10-CM | POA: Diagnosis not present

## 2023-12-21 DIAGNOSIS — C44599 Other specified malignant neoplasm of skin of other part of trunk: Secondary | ICD-10-CM | POA: Diagnosis present

## 2023-12-21 DIAGNOSIS — L98421 Non-pressure chronic ulcer of back limited to breakdown of skin: Secondary | ICD-10-CM | POA: Insufficient documentation

## 2023-12-22 ENCOUNTER — Ambulatory Visit (HOSPITAL_BASED_OUTPATIENT_CLINIC_OR_DEPARTMENT_OTHER): Admitting: Physical Therapy

## 2023-12-22 DIAGNOSIS — M25611 Stiffness of right shoulder, not elsewhere classified: Secondary | ICD-10-CM

## 2023-12-22 DIAGNOSIS — M25612 Stiffness of left shoulder, not elsewhere classified: Secondary | ICD-10-CM

## 2023-12-22 DIAGNOSIS — R29898 Other symptoms and signs involving the musculoskeletal system: Secondary | ICD-10-CM

## 2023-12-22 DIAGNOSIS — M6281 Muscle weakness (generalized): Secondary | ICD-10-CM

## 2023-12-22 DIAGNOSIS — M436 Torticollis: Secondary | ICD-10-CM

## 2023-12-22 DIAGNOSIS — M542 Cervicalgia: Secondary | ICD-10-CM

## 2023-12-22 DIAGNOSIS — R262 Difficulty in walking, not elsewhere classified: Secondary | ICD-10-CM | POA: Diagnosis not present

## 2023-12-22 NOTE — Therapy (Unsigned)
 OUTPATIENT PHYSICAL treatment   Patient Name: Kathryn Lucas MRN: [id number] DOB:[birth year], 63 y.o., female Today's Date: 12/22/2023   PCP: Kathryn Velna SAUNDERS, MD  REFERRING PROVIDER: Odean Potts, MD   END OF SESSION:  PT End of Session - 12/22/23 1403     Visit Number 4    Number of Visits 16    Date for Recertification  01/19/24    PT Start Time 1405    PT Stop Time 1445    PT Time Calculation (min) 40 min    Equipment Utilized During Treatment Gait belt    Activity Tolerance Patient tolerated treatment well    Behavior During Therapy [facility] for tasks assessed/performed          Past Medical History:  Diagnosis Date   Breast cancer (HCC)    left breast cancer   Cancer (HCC) 09/2017   left breast cancer   Family history of breast cancer    Hypothyroidism    Personal history of radiation therapy 2019   Thyroid  disease    Past Surgical History:  Procedure Laterality Date   BREAST BIOPSY Left 2018   BREAST BIOPSY Left 2019   BREAST RECONSTRUCTION WITH PLACEMENT OF TISSUE EXPANDER AND ALLODERM Left 10/26/2017   Procedure: LEFT BREAST RECONSTRUCTION WITH PLACEMENT OF TISSUE EXPANDER AND ALLODERM;  Surgeon: Kathryn Filippo, MD;  Location: [facility];  Service: Plastics;  Laterality: Left;   IR IMAGING GUIDED PORT INSERTION  04/27/2023   MASTECTOMY Left 2019   MASTECTOMY WITH RADIOACTIVE SEED GUIDED EXCISION AND AXILLARY SENTINEL LYMPH NODE BIOPSY Left 10/26/2017   Procedure: LEFT MASTECTOMY WITH SEED TARGETED  LEFT AXILLARY LYMPH NODE EXCISION AND LEFT SENTINEL LYMPH NODE BIOPSY;  Surgeon: Kathryn Shoulders, MD;  Location: [facility];  Service: General;  Laterality: Left;   TONSILLECTOMY     WISDOM TOOTH EXTRACTION     Patient Active Problem List   Diagnosis Date Noted   Focal seizure (HCC) 09/02/2023   Metastatic 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) 10/14/2022   Elevated liver enzymes 10/14/2022   Unexplained weight loss 10/14/2022   Neoplasm related  pain 10/14/2022   Metastatic malignant neoplasm (HCC) 12/19/2021   Family history of breast cancer    History of therapeutic radiation 04/27/2018   Breast cancer, left breast (HCC) 10/26/2017   Malignant neoplasm of lower-outer quadrant of left breast of female, estrogen receptor positive (HCC) 07/15/2016   Plantar fasciitis 07/30/2015   Metatarsalgia 10/18/2014    ONSET DATE: September 2023   REFERRING DIAG:  Diagnosis  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  Reports increased L sided cervical pain. Does not rate. States that she picked up a heavy trash bag two days ago, resulting in increased pain.  From Eval: Pt arrives at PT after prolonged hiatus from PT due to progress reports at that time. Reports that she  had restarted CA treatment via radiation to address metastases of skull and Spine. Completed radiation therapy  in September. Scans show improvement in cerebral metastases .   Pt reports that she feels like her legs and arms are weaker now after completing recent Radiation treatment. Wants to be able to stand in one place for increased time.   Pt accompanied by: none.   PERTINENT HISTORY:   Kathryn Lucas is a 63 year old female with metastatic cancer who presents for follow-up on her recent CT scans and lab results.   Her recent CT scans show stable metastatic lesions in the liver, bones,  lymph nodes, and soft tissue thickening in the back, with resolution of skull lesions.   Pt had exacerbation of s/s in June of 2024 with hospitalization and subsequent change in oncology treatment limiting ability to attend PT. Pt reports recent decline again in function following radiation in summer 2025.   PAIN:  Are you having pain? No  PRECAUTIONS: Cervical and Other: cervical for comfort   RED FLAGS: None   WEIGHT BEARING RESTRICTIONS: No  FALLS: Has patient fallen in last 6 months? Yes. Number of falls 1  LIVING ENVIRONMENT: Lives with: lives alone Lives in: House/apartment Stairs: yes, but uses ramp  Has following equipment at home: Walker - 4 wheeled  PLOF: Needs assistance with ADLs and Needs assistance with homemaking  PATIENT GOALS: improve balance, strength and endurance. Stand at kitchen longer and   OBJECTIVE:   DIAGNOSTIC FINDINGS:  11/17/2023  CT head IMPRESSION: 1. No acute intracranial abnormality. 2. 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 3. Stable heterogeneous lesion involving the right frontal calvarium and additional scattered lytic lesions throughout the visualized calvarium. 4. Decreased soft tissue along the lateral aspect of the right globe, possibly representing mild prominence of the lacrimal gland. The orbits are otherwise unremarkable.  CT chest/abdomin  IMPRESSION: 1. Bilobar hepatic metastasis are similar to recent PET-CT but progressed from CT May 11, 2023. 2. Extensive osseous metastatic disease involving the axial and proximal appendicular skeleton with a few areas showing increased sclerosis which may reflect treatment response but is nonspecific. Suggest attention on follow-up imaging. 3. Stable right axillary and right external iliac lymph nodes. 4. Subcutaneous/cutaneous nodular soft tissue thickening in the posterior upper back  which was hypermetabolic on prior PET-CT is similar to that examination, compatible with disease involvement. 5. Gas fluid levels in a patulous esophagus, suggestive of gastroesophageal reflux. 6. Colonic stool burden compatible with constipation.   7/2 PET  IMPRESSION: 1. Overall disease progression as evidenced by increase in the hypermetabolic right axillary lymph nodes, new hypermetabolic mediastinal adenopathy and new hepatic metastases. 2. New left cervical level II lymph node, worrisome for metastatic disease. 3. Minimal improvement in right external iliac adenopathy. 4. Overall minimal improvement in osseous metastatic disease.   6/6:  Head CT  IMPRESSION: 1. No acute intracranial abnormality. 2. Extensive, predominantly stable areas of osseous metastasis throughout the calvarium. 3. New area of enhancing soft tissue attenuation along the lateral aspect of the right globe, as described above. MRI correlation is recommended. 4. Multiple areas  of soft tissue attenuation throughout the scalp, mildly increased in size and number when compared to the prior study. Mild interval progression of metastatic disease cannot be excluded.    COGNITION: Overall cognitive status: Within functional limits for tasks assessed   SENSATION: WFL  COORDINATION: Limited due to ROM deficits. WFL    MUSCLE TONE: WFL    POSTURE: rounded Lucas, forward head, and decreased lumbar lordosis  LOWER EXTREMITY MMT:   MMT  Right Eval Left Eval  Hip flexion 4 4  Hip extension    Hip abduction 4 4  Hip adduction 4+ 4+  Hip internal rotation    Hip external rotation    Knee flexion 4+ 4+  Knee extension 4+ 4+  Ankle dorsiflexion 5 5  Ankle plantarflexion 4 4  Ankle inversion    Ankle eversion     (Blank rows = not tested) MMT  Right Eval Left Eval  Shoulder  flexion 4- 4  shoulder extension 4+ 4+  Shoulder abduction 4 4  Shoulder adduction 4+ 4+  Shoulder  internal  rotation 4- 4  Shoulder external rotation 4+ 4+  Elbow flexion 4 4+  Elbow extension 4+ 4+                    LOWER EXTREMITY ROM:     Limited shoulder flexion and abduction to ~ 85 DEG  on the R shoulder   BED MOBILITY:  Sit to supine SBA Supine to sit SBA  TRANSFERS: Assistive device utilized: None  Sit to stand: Modified independence Stand to sit: Modified independence Chair to chair: Modified independence Floor: Modified independence   CURB:  Level of Assistance: CGA Assistive device utilized: Youth worker Comments: step to pattern  STAIRS: Level of Assistance: CGA Stair Negotiation Technique: Step to Pattern with Bilateral Rails Number of Stairs: 4  Height of Stairs: 6  Comments: step to with BUE support   GAIT: Gait pattern: step through pattern genu recurvatum on the RLE.   Distance walked: 181ft Assistive device utilized: None Level of assistance: SBA Comments: no circumduction, greatly improved since last bout of PT treatment.   FUNCTIONAL TESTS:  5 times sit to stand: 16.57 sec with UE support   Timed up and go (TUG): 9.4 sec   6 minute walk test: 1090 10 meter walk test:  9.55 sec (1.42m/s)  Berg Balance Scale: 47/56 Functional gait assessment: 19/30   PATIENT SURVEYS:  ABC scale: The Activities-Specific Balance Confidence (ABC) Scale 0% 10 20 30  40 50 60 70 80 90 100% No confidence<->completely confident  "How confident are you that you will not lose your balance or become unsteady when you . . .   Date tested 11/24/23   Walk around the house 100%  2. Walk up or down stairs 100%  3. Bend over and pick up a slipper from in front of a closet floor 100%  4. Reach for a small can off a shelf at eye level 100%  5. Stand on tip toes and reach for something above your head 100%  6. Stand on a chair and reach for something 60%  7. Sweep the floor 100%  8. Walk outside the house to a car parked in the driveway 100%  9. Get into or out of a car 100%   10. Walk across a parking lot to the mall 100%  11. Walk up or down a ramp 100%  12. Walk in a crowded mall where people rapidly walk past  you 100%  13. Are bumped into by people as you walk through the mall 80%  14. Step onto or off of an escalator while you are holding onto the railing 100%  15. Step onto or off an escalator while holding onto parcels such that you cannot hold onto the railing 80%  16. Walk outside on icy sidewalks 70%  Total: #/16  93%     [facility] PT Assessment - 12/08/23 0001       Assessment   Hand Dominance Right      Berg Balance Test   Sit to Stand Able to stand  independently using hands    Standing Unsupported Able to stand safely 2 minutes    Sitting with Back Unsupported but Feet Supported on Floor or Stool Able to sit safely and securely 2 minutes    Stand to Sit Controls descent by using hands    Transfers Able to transfer safely, definite need of hands    Standing Unsupported with Eyes Closed Able to stand 10 seconds safely    Standing Unsupported with Feet Together Able to place feet together independently and stand 1 minute safely    From Standing, Reach Forward with Outstretched Arm Can reach forward >12 cm safely (5)    From Standing Position, Pick up Object from Floor Able to pick up shoe safely and easily    From Standing Position, Turn to Look Behind Over each Shoulder Looks behind one side only/other side shows less weight shift    Turn 360 Degrees Able to turn 360 degrees safely one side only in 4 seconds or less    Standing Unsupported, Alternately Place Feet on Step/Stool Able to stand independently and safely and complete 8 steps in 20 seconds    Standing Unsupported, One Foot in Front Able to plae foot ahead of the other independently and hold 30 seconds    Standing on One Leg Able to lift leg independently and hold equal to or more than 3 seconds      Functional Gait  Assessment   Gait Level Surface Walks 20 ft in less than 5.5 sec, no  assistive devices, good speed, no evidence for imbalance, normal gait pattern, deviates no more than 6 in outside of the 12 in walkway width.    Change in Gait Speed Able to smoothly change walking speed without loss of balance or gait deviation. Deviate no more than 6 in outside of the 12 in walkway width.    Gait with Horizontal Head Turns Performs head turns smoothly with slight change in gait velocity (eg, minor disruption to smooth gait path), deviates 6-10 in outside 12 in walkway width, or uses an assistive device.    Gait with Vertical Head Turns Performs head turns with no change in gait. Deviates no more than 6 in outside 12 in walkway width.    Gait and Pivot Turn Pivot turns safely within 3 sec and stops quickly with no loss of balance.    Step Over Obstacle Is able to step over one shoe box (4.5 in total height) but must slow down and adjust steps to clear box safely. May require verbal cueing.    Gait with Narrow Base of Support Ambulates less than 4 steps heel to toe or cannot perform without assistance.    Gait with Eyes Closed Walks 20 ft, slow speed, abnormal gait pattern, evidence for imbalance, deviates 10-15 in outside 12 in walkway width. Requires more than 9 sec to ambulate 20 ft.  Ambulating Backwards Walks 20 ft, slow speed, abnormal gait pattern, evidence for imbalance, deviates 10-15 in outside 12 in walkway width.    Steps Alternating feet, must use rail.          TODAY'S TREATMENT:                                                                                                                              DATE: 12/22/2023   PT instructed pt in BUE reciprocal activity tolerance and AAROM through UBE; level 4 x 2 min forward/2 min reverse with pause between bouts;   Manual:  STM to L and R cervical paraspinals and UT x 6 min with  Suboccipital release 2 x 2 min  Cervical rotation AAROM with STM. X 2 min bil Lateral flexion with STM x 1 min  Supine therex: heat  pack applied to the Cspine Chest press with PVC x 15  AAROM Shoulder flexion within available range x 15 LAQ x 12  Hip abduction RTB x 12bil Hip flexion RTB x 12bil     PATIENT EDUCATION: Education details: Pt educated throughout session about proper posture and technique with exercises. Improved exercise technique, movement at target joints, use of target muscles after min to mod verbal, visual, tactile cues.  Person educated: Patient Education method: Explanation Education comprehension: verbalized understanding  HOME EXERCISE PROGRAM:  Access Code: [email or web address] URL: [email or web address] Date: 12/08/2023 Prepared by: Massie Dollar  Exercises - Sit to Stand with Armchair  - 1 x daily - 7 x weekly - 3 sets - 10 reps - Seated Long Arc Quad  - 1 x daily - 7 x weekly - 3 sets - 10 reps - Standing March with Counter Support  - 1 x daily - 7 x weekly - 3 sets - 10 reps - Seated March  - 1 x daily - 7 x weekly - 3 sets - 10 reps - Side Stepping with Counter Support  - 1 x daily - 7 x weekly - 3 sets - 10 reps - Seated Heel Raise  - 1 x daily - 7 x weekly - 3 sets - 10 reps - Seated Toe Raise  - 1 x daily - 7 x weekly - 3 sets - 10 repsit.   GOALS: Goals reviewed with patient? Yes  SHORT TERM GOALS: Target date: 12/22/2023   Patient will be independent in home exercise program to improve strength/mobility for better functional independence with ADLs. Baseline: to provide at session 1 or 2  Goal status: INITIAL   LONG TERM GOALS: Target date: 01/19/2024    Patient will increase LEFS score to equal to or greater than 8 points   to demonstrate statistically significant improvement in mobility and quality of life.  Baseline:  to be completed   Goal status: INITIAL  2.  Patient (> 73 years old) will complete five times sit to stand test in < 15 seconds without use of BUE indicating an  increased LE strength and improved balance. Baseline: 16.57 sec with UE supported  on arm rest  Goal status: INITIAL  3.  Patient will increase Berg Balance score by > 6 points to demonstrate decreased fall risk during functional activities Baseline: 47/56 Goal status: INITIAL  4.  Patient will increase 6 min walk test to >1294ft as to improve gait speed for better community ambulation and to reduce fall risk. Baseline:1076ft Goal status: INITIAL    5.  Patient will ascend and descent stairs without UE support to improve independence with access to home and community   Goal status: INITIAL  6.  Patient will increase FGA score to >22 as to demonstrate reduced fall risk and improved dynamic gait balance for better safety with community/home ambulation. Baseline:19 Goal status: INITIAL   ASSESSMENT:  CLINICAL IMPRESSION: Pt put forth excellent effort for PT treatment to  address functional deficits listed above secondary to continued CA treatment.  PT treatment focused on BLE strength, gentle AAROM for BUE and STM for pain relief in the Cspine. Reports improved ROM and decreased cervical stiffness on this day following interventions. Pt will benefit from skilled PT treatment  to address balance, strength and ROM deficits, improve safety and QoL while continuing CA treatment.    OBJECTIVE IMPAIRMENTS: Abnormal gait, cardiopulmonary status limiting activity, decreased activity tolerance, decreased balance, decreased endurance, decreased knowledge of condition, decreased mobility, difficulty walking, decreased ROM, decreased strength, hypomobility, increased fascial restrictions, impaired perceived functional ability, impaired flexibility, impaired UE functional use, improper body mechanics, postural dysfunction, and pain.   ACTIVITY LIMITATIONS: carrying, lifting, bending, standing, squatting, sleeping, stairs, transfers, bathing, toileting, dressing, reach over head, and locomotion level  PARTICIPATION LIMITATIONS: cleaning, laundry, driving, shopping, community activity,  and yard work  PERSONAL FACTORS: Age, Behavior pattern, Fitness, Past/current experiences, and 1-2 comorbidities: CA with profound metastases  are also affecting patient's functional outcome.   REHAB POTENTIAL: Good  CLINICAL DECISION MAKING: Evolving/moderate complexity  EVALUATION COMPLEXITY: Moderate  PLAN:  PT FREQUENCY: 1-2x/week  PT DURATION: 8 weeks  PLANNED INTERVENTIONS: Therapeutic exercises, Therapeutic activity, Neuromuscular re-education, Balance training, Gait training, Patient/Family education, Self Care, Joint mobilization, Stair training, DME instructions, Cryotherapy, Moist heat, and Manual therapy  PLAN FOR NEXT SESSION:   HEP review/expansion UE/cervical ROM as appropriate.     Massie FORBES Dollar, PT 12/22/2023, 2:04 PM

## 2023-12-28 ENCOUNTER — Encounter (INDEPENDENT_AMBULATORY_CARE_PROVIDER_SITE_OTHER): Payer: Self-pay

## 2023-12-28 ENCOUNTER — Other Ambulatory Visit (HOSPITAL_COMMUNITY): Payer: Self-pay

## 2023-12-28 NOTE — Progress Notes (Signed)
 Specialty Pharmacy Refill Coordination Note  Kathryn Lucas is a 63 y.o. female contacted today regarding refills of specialty medication(s) Capecitabine  (XELODA )   Patient requested Delivery   Delivery date: 12/30/23   Verified address: [address]  [place]  [place] [address]   Medication will be filled on 12/29/23.

## 2023-12-29 ENCOUNTER — Ambulatory Visit: Admitting: Physical Therapy

## 2023-12-29 DIAGNOSIS — M25611 Stiffness of right shoulder, not elsewhere classified: Secondary | ICD-10-CM

## 2023-12-29 DIAGNOSIS — M6281 Muscle weakness (generalized): Secondary | ICD-10-CM

## 2023-12-29 DIAGNOSIS — R29898 Other symptoms and signs involving the musculoskeletal system: Secondary | ICD-10-CM

## 2023-12-29 DIAGNOSIS — M25612 Stiffness of left shoulder, not elsewhere classified: Secondary | ICD-10-CM

## 2023-12-29 DIAGNOSIS — R262 Difficulty in walking, not elsewhere classified: Secondary | ICD-10-CM

## 2023-12-29 DIAGNOSIS — M542 Cervicalgia: Secondary | ICD-10-CM

## 2023-12-29 DIAGNOSIS — M436 Torticollis: Secondary | ICD-10-CM

## 2023-12-29 NOTE — Therapy (Signed)
 OUTPATIENT PHYSICAL treatment   Patient Name: Kathryn Lucas MRN: [id number] DOB:April 28, 1960, 63 y.o., female Today's Date: 12/29/2023   PCP: Vernon Velna SAUNDERS, MD  REFERRING PROVIDER: Odean Potts, MD   END OF SESSION:  PT End of Session - 12/29/23 1638     Visit Number 5    Number of Visits 16    Date for Recertification  01/19/24    PT Start Time 1623    PT Stop Time 1701    PT Time Calculation (min) 38 min    Equipment Utilized During Treatment Gait belt    Activity Tolerance Patient tolerated treatment well    Behavior During Therapy WFL for tasks assessed/performed          Past Medical History:  Diagnosis Date   Breast cancer (HCC)    left breast cancer   Cancer (HCC) 09/2017   left breast cancer   Family history of breast cancer    Hypothyroidism    Personal history of radiation therapy 2019   Thyroid  disease    Past Surgical History:  Procedure Laterality Date   BREAST BIOPSY Left 2018   BREAST BIOPSY Left 2019   BREAST RECONSTRUCTION WITH PLACEMENT OF TISSUE EXPANDER AND ALLODERM Left 10/26/2017   Procedure: LEFT BREAST RECONSTRUCTION WITH PLACEMENT OF TISSUE EXPANDER AND ALLODERM;  Surgeon: Arelia Filippo, MD;  Location: [facility];  Service: Plastics;  Laterality: Left;   IR IMAGING GUIDED PORT INSERTION  04/27/2023   MASTECTOMY Left 2019   MASTECTOMY WITH RADIOACTIVE SEED GUIDED EXCISION AND AXILLARY SENTINEL LYMPH NODE BIOPSY Left 10/26/2017   Procedure: LEFT MASTECTOMY WITH SEED TARGETED  LEFT AXILLARY LYMPH NODE EXCISION AND LEFT SENTINEL LYMPH NODE BIOPSY;  Surgeon: Aron Shoulders, MD;  Location: [facility];  Service: General;  Laterality: Left;   TONSILLECTOMY     WISDOM TOOTH EXTRACTION     Patient Active Problem List   Diagnosis Date Noted   Focal seizure (HCC) 09/02/2023   Metastatic 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) 10/14/2022   Elevated liver enzymes 10/14/2022   Unexplained weight loss 10/14/2022   Neoplasm  related pain 10/14/2022   Metastatic malignant neoplasm (HCC) 12/19/2021   Family history of breast cancer    History of therapeutic radiation 04/27/2018   Breast cancer, left breast (HCC) 10/26/2017   Malignant neoplasm of lower-outer quadrant of left breast of female, estrogen receptor positive (HCC) 07/15/2016   Plantar fasciitis 07/30/2015   Metatarsalgia 10/18/2014    ONSET DATE: September 2023   REFERRING DIAG:  Diagnosis  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  Reports that neck is feeling much better. Wants to continue to address functional strength deficits.   From Eval: Pt arrives at PT after prolonged hiatus from PT due to progress reports at that time. Reports that she  had restarted CA treatment via radiation to address metastases of skull and Spine. Completed radiation therapy  in September. Scans show improvement in cerebral metastases .   Pt reports that she feels like her legs and arms are weaker now after completing recent Radiation treatment. Wants to be able to stand in one place for increased time.   Pt accompanied by: none.   PERTINENT HISTORY:   Kathryn Lucas is a 64 year old female with metastatic cancer who presents for follow-up on her recent CT scans and lab results.   Her recent CT scans show stable metastatic lesions in the liver, bones, lymph nodes, and soft tissue  thickening in the back, with resolution of skull lesions.   Pt had exacerbation of s/s in June of 2024 with hospitalization and subsequent change in oncology treatment limiting ability to attend PT. Pt reports recent decline again in function following radiation in summer 2025.   PAIN:  Are you having pain? No  PRECAUTIONS: Cervical and Other: cervical for comfort   RED FLAGS: None   WEIGHT BEARING RESTRICTIONS: No  FALLS: Has patient fallen in last 6 months? Yes. Number of falls 1  LIVING ENVIRONMENT: Lives with: lives alone Lives in: House/apartment Stairs: yes, but uses ramp  Has following equipment at home: Walker - 4 wheeled  PLOF: Needs assistance with ADLs and Needs assistance with homemaking  PATIENT GOALS: improve balance, strength and endurance. Stand at kitchen longer and   OBJECTIVE:   DIAGNOSTIC FINDINGS:  11/17/2023  CT head IMPRESSION: 1. No acute intracranial abnormality. 2. 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 3. Stable heterogeneous lesion involving the right frontal calvarium and additional scattered lytic lesions throughout the visualized calvarium. 4. Decreased soft tissue along the lateral aspect of the right globe, possibly representing mild prominence of the lacrimal gland. The orbits are otherwise unremarkable.  CT chest/abdomin  IMPRESSION: 1. Bilobar hepatic metastasis are similar to recent PET-CT but progressed from CT May 11, 2023. 2. Extensive osseous metastatic disease involving the axial and proximal appendicular skeleton with a few areas showing increased sclerosis which may reflect treatment response but is nonspecific. Suggest attention on follow-up imaging. 3. Stable right axillary and right external iliac lymph nodes. 4. Subcutaneous/cutaneous nodular soft tissue thickening in the posterior upper back which was hypermetabolic on  prior PET-CT is similar to that examination, compatible with disease involvement. 5. Gas fluid levels in a patulous esophagus, suggestive of gastroesophageal reflux. 6. Colonic stool burden compatible with constipation.   7/2 PET  IMPRESSION: 1. Overall disease progression as evidenced by increase in the hypermetabolic right axillary lymph nodes, new hypermetabolic mediastinal adenopathy and new hepatic metastases. 2. New left cervical level II lymph node, worrisome for metastatic disease. 3. Minimal improvement in right external iliac adenopathy. 4. Overall minimal improvement in osseous metastatic disease.   6/6:  Head CT  IMPRESSION: 1. No acute intracranial abnormality. 2. Extensive, predominantly stable areas of osseous metastasis throughout the calvarium. 3. New area of enhancing soft tissue attenuation along the lateral aspect of the right globe, as described above. MRI correlation is recommended. 4. Multiple areas of soft tissue attenuation throughout the scalp, mildly increased  in size and number when compared to the prior study. Mild interval progression of metastatic disease cannot be excluded.    COGNITION: Overall cognitive status: Within functional limits for tasks assessed   SENSATION: WFL  COORDINATION: Limited due to ROM deficits. WFL    MUSCLE TONE: WFL    POSTURE: rounded shoulders, forward head, and decreased lumbar lordosis  LOWER EXTREMITY MMT:   MMT  Right Eval Left Eval  Hip flexion 4 4  Hip extension    Hip abduction 4 4  Hip adduction 4+ 4+  Hip internal rotation    Hip external rotation    Knee flexion 4+ 4+  Knee extension 4+ 4+  Ankle dorsiflexion 5 5  Ankle plantarflexion 4 4  Ankle inversion    Ankle eversion     (Blank rows = not tested) MMT  Right Eval Left Eval  Shoulder  flexion 4- 4  shoulder extension 4+ 4+  Shoulder abduction 4 4  Shoulder adduction 4+ 4+  Shoulder  internal rotation 4- 4  Shoulder  external rotation 4+ 4+  Elbow flexion 4 4+  Elbow extension 4+ 4+                    LOWER EXTREMITY ROM:     Limited shoulder flexion and abduction to ~ 85 DEG  on the R shoulder   BED MOBILITY:  Sit to supine SBA Supine to sit SBA  TRANSFERS: Assistive device utilized: None  Sit to stand: Modified independence Stand to sit: Modified independence Chair to chair: Modified independence Floor: Modified independence   CURB:  Level of Assistance: CGA Assistive device utilized: Youth worker Comments: step to pattern  STAIRS: Level of Assistance: CGA Stair Negotiation Technique: Step to Pattern with Bilateral Rails Number of Stairs: 4  Height of Stairs: 6  Comments: step to with BUE support   GAIT: Gait pattern: step through pattern genu recurvatum on the RLE.   Distance walked: 157ft Assistive device utilized: None Level of assistance: SBA Comments: no circumduction, greatly improved since last bout of PT treatment.   FUNCTIONAL TESTS:  5 times sit to stand: 16.57 sec with UE support   Timed up and go (TUG): 9.4 sec   6 minute walk test: 1090 10 meter walk test:  9.55 sec (1.84m/s)  Berg Balance Scale: 47/56 Functional gait assessment: 19/30   PATIENT SURVEYS:  ABC scale: The Activities-Specific Balance Confidence (ABC) Scale 0% 10 20 30  40 50 60 70 80 90 100% No confidence<->completely confident  "How confident are you that you will not lose your balance or become unsteady when you . . .   Date tested 11/24/23   Walk around the house 100%  2. Walk up or down stairs 100%  3. Bend over and pick up a slipper from in front of a closet floor 100%  4. Reach for a small can off a shelf at eye level 100%  5. Stand on tip toes and reach for something above your head 100%  6. Stand on a chair and reach for something 60%  7. Sweep the floor 100%  8. Walk outside the house to a car parked in the driveway 100%  9. Get into or out of a car 100%  10. Walk across a  parking lot to the mall 100%  11. Walk up or down a ramp 100%  12. Walk in a crowded mall where people rapidly walk past you 100%  13. Are bumped into by people  as you walk through the mall 80%  14. Step onto or off of an escalator while you are holding onto the railing 100%  15. Step onto or off an escalator while holding onto parcels such that you cannot hold onto the railing 80%  16. Walk outside on icy sidewalks 70%  Total: #/16  93%     [facility] PT Assessment - 12/08/23 0001       Assessment   Hand Dominance Right      Berg Balance Test   Sit to Stand Able to stand  independently using hands    Standing Unsupported Able to stand safely 2 minutes    Sitting with Back Unsupported but Feet Supported on Floor or Stool Able to sit safely and securely 2 minutes    Stand to Sit Controls descent by using hands    Transfers Able to transfer safely, definite need of hands    Standing Unsupported with Eyes Closed Able to stand 10 seconds safely    Standing Unsupported with Feet Together Able to place feet together independently and stand 1 minute safely    From Standing, Reach Forward with Outstretched Arm Can reach forward >12 cm safely (5)    From Standing Position, Pick up Object from Floor Able to pick up shoe safely and easily    From Standing Position, Turn to Look Behind Over each Shoulder Looks behind one side only/other side shows less weight shift    Turn 360 Degrees Able to turn 360 degrees safely one side only in 4 seconds or less    Standing Unsupported, Alternately Place Feet on Step/Stool Able to stand independently and safely and complete 8 steps in 20 seconds    Standing Unsupported, One Foot in Front Able to plae foot ahead of the other independently and hold 30 seconds    Standing on One Leg Able to lift leg independently and hold equal to or more than 3 seconds      Functional Gait  Assessment   Gait Level Surface Walks 20 ft in less than 5.5 sec, no assistive devices, good  speed, no evidence for imbalance, normal gait pattern, deviates no more than 6 in outside of the 12 in walkway width.    Change in Gait Speed Able to smoothly change walking speed without loss of balance or gait deviation. Deviate no more than 6 in outside of the 12 in walkway width.    Gait with Horizontal Head Turns Performs head turns smoothly with slight change in gait velocity (eg, minor disruption to smooth gait path), deviates 6-10 in outside 12 in walkway width, or uses an assistive device.    Gait with Vertical Head Turns Performs head turns with no change in gait. Deviates no more than 6 in outside 12 in walkway width.    Gait and Pivot Turn Pivot turns safely within 3 sec and stops quickly with no loss of balance.    Step Over Obstacle Is able to step over one shoe box (4.5 in total height) but must slow down and adjust steps to clear box safely. May require verbal cueing.    Gait with Narrow Base of Support Ambulates less than 4 steps heel to toe or cannot perform without assistance.    Gait with Eyes Closed Walks 20 ft, slow speed, abnormal gait pattern, evidence for imbalance, deviates 10-15 in outside 12 in walkway width. Requires more than 9 sec to ambulate 20 ft.    Ambulating Backwards Walks 20 ft, slow  speed, abnormal gait pattern, evidence for imbalance, deviates 10-15 in outside 12 in walkway width.    Steps Alternating feet, must use rail.          TODAY'S TREATMENT:                                                                                                                              DATE: 12/29/2023  Nustep reciprocal movement training x 10 min. Rolling hill program   Standing at rail:  Partial squat with UE support x 15   Forward lunge onto second step of stair x 12 bil with UE assist with press through BUE on rails to return to starting position.  Sit<>stand with light UE support from chair seat  2 x 8 second bout performed with hands together and light UE to  push from thighs  Kore balance  Static hold 2 x 30 sec with UE support on the RLE and no UE support on the second bout AP control x 5 each  Lateral control x 5 bil  Small circles CW x 3 and CCW x 3  Tux racer x 2 bouts. 1:17 21 fish and 60 sec  14 fish.   Multiple therapuetic rest break throughout session due to BLE fatigue and shoulder tightness.   PATIENT EDUCATION: Education details: Pt educated throughout session about proper posture and technique with exercises. Improved exercise technique, movement at target joints, use of target muscles after min to mod verbal, visual, tactile cues.  Person educated: Patient Education method: Explanation Education comprehension: verbalized understanding  HOME EXERCISE PROGRAM:  Access Code: [email or web address] URL: [email or web address] Date: 12/08/2023 Prepared by: Massie Dollar  Exercises - Sit to Stand with Armchair  - 1 x daily - 7 x weekly - 3 sets - 10 reps - Seated Long Arc Quad  - 1 x daily - 7 x weekly - 3 sets - 10 reps - Standing March with Counter Support  - 1 x daily - 7 x weekly - 3 sets - 10 reps - Seated March  - 1 x daily - 7 x weekly - 3 sets - 10 reps - Side Stepping with Counter Support  - 1 x daily - 7 x weekly - 3 sets - 10 reps - Seated Heel Raise  - 1 x daily - 7 x weekly - 3 sets - 10 reps - Seated Toe Raise  - 1 x daily - 7 x weekly - 3 sets - 10 repsit.   GOALS: Goals reviewed with patient? Yes  SHORT TERM GOALS: Target date: 12/22/2023   Patient will be independent in home exercise program to improve strength/mobility for better functional independence with ADLs. Baseline: to provide at session 1 or 2  Goal status: INITIAL   LONG TERM GOALS: Target date: 01/19/2024    Patient will increase LEFS score to equal to or greater than 8 points   to demonstrate statistically significant improvement in mobility and quality of  life.  Baseline:  to be completed   Goal status: INITIAL  2.  Patient (> 8  years old) will complete five times sit to stand test in < 15 seconds without use of BUE indicating an increased LE strength and improved balance. Baseline: 16.57 sec with UE supported on arm rest  Goal status: INITIAL  3.  Patient will increase Berg Balance score by > 6 points to demonstrate decreased fall risk during functional activities Baseline: 47/56 Goal status: INITIAL  4.  Patient will increase 6 min walk test to >1253ft as to improve gait speed for better community ambulation and to reduce fall risk. Baseline:1025ft Goal status: INITIAL    5.  Patient will ascend and descent stairs without UE support to improve independence with access to home and community   Goal status: INITIAL  6.  Patient will increase FGA score to >22 as to demonstrate reduced fall risk and improved dynamic gait balance for better safety with community/home ambulation. Baseline:19 Goal status: INITIAL   ASSESSMENT:  CLINICAL IMPRESSION: Pt put forth excellent effort for PT treatment to  address functional deficits listed above secondary to continued CA treatment.  PT treatment focused on BLE strength, gentle AAROM for BUE. Pt also instructed in dynamic balance training on kore balance and prolonged standing tolerance training. Pt will benefit from skilled PT treatment  to address balance, strength and ROM deficits, improve safety and QoL while continuing CA treatment.    OBJECTIVE IMPAIRMENTS: Abnormal gait, cardiopulmonary status limiting activity, decreased activity tolerance, decreased balance, decreased endurance, decreased knowledge of condition, decreased mobility, difficulty walking, decreased ROM, decreased strength, hypomobility, increased fascial restrictions, impaired perceived functional ability, impaired flexibility, impaired UE functional use, improper body mechanics, postural dysfunction, and pain.   ACTIVITY LIMITATIONS: carrying, lifting, bending, standing, squatting, sleeping, stairs,  transfers, bathing, toileting, dressing, reach over head, and locomotion level  PARTICIPATION LIMITATIONS: cleaning, laundry, driving, shopping, community activity, and yard work  PERSONAL FACTORS: Age, Behavior pattern, Fitness, Past/current experiences, and 1-2 comorbidities: CA with profound metastases  are also affecting patient's functional outcome.   REHAB POTENTIAL: Good  CLINICAL DECISION MAKING: Evolving/moderate complexity  EVALUATION COMPLEXITY: Moderate  PLAN:  PT FREQUENCY: 1-2x/week  PT DURATION: 8 weeks  PLANNED INTERVENTIONS: Therapeutic exercises, Therapeutic activity, Neuromuscular re-education, Balance training, Gait training, Patient/Family education, Self Care, Joint mobilization, Stair training, DME instructions, Cryotherapy, Moist heat, and Manual therapy  PLAN FOR NEXT SESSION:   Generalized strengthening. UE/cervical ROM as appropriate.     Massie FORBES Dollar, PT 12/29/2023, 4:40 PM

## 2023-12-30 ENCOUNTER — Encounter: Admitting: Physician Assistant

## 2023-12-30 DIAGNOSIS — C44599 Other specified malignant neoplasm of skin of other part of trunk: Secondary | ICD-10-CM | POA: Diagnosis not present

## 2024-01-03 ENCOUNTER — Other Ambulatory Visit: Payer: Self-pay

## 2024-01-04 ENCOUNTER — Other Ambulatory Visit: Payer: Self-pay

## 2024-01-04 NOTE — Progress Notes (Signed)
 Specialty Pharmacy Ongoing Clinical Assessment Note  Kathryn Lucas is a 63 y.o. female who is being followed by the specialty pharmacy service for RxSp Oncology   Patient's specialty medication(s) reviewed today: Capecitabine  (XELODA )   Missed doses in the last 4 weeks: 0   Patient/Caregiver did not have any additional questions or concerns.   Therapeutic benefit summary: Patient is achieving benefit   Adverse events/side effects summary: Experienced adverse events/side effects (fatigue, tolerable)   Patient's therapy is appropriate to: Continue    Goals Addressed             This Visit's Progress    Maintain optimal adherence to therapy   On track    Patient is initiating therapy. Patient will maintain adherence. Patient was not taking as prescribed, she was counseled and details are available in the clinical intervention notes.          Follow up: 3 months  Silvano LOISE Dolly Specialty Pharmacist

## 2024-01-05 ENCOUNTER — Ambulatory Visit: Admitting: Physical Therapy

## 2024-01-05 DIAGNOSIS — R262 Difficulty in walking, not elsewhere classified: Secondary | ICD-10-CM

## 2024-01-05 DIAGNOSIS — M542 Cervicalgia: Secondary | ICD-10-CM

## 2024-01-05 DIAGNOSIS — M6281 Muscle weakness (generalized): Secondary | ICD-10-CM | POA: Diagnosis not present

## 2024-01-05 DIAGNOSIS — M25611 Stiffness of right shoulder, not elsewhere classified: Secondary | ICD-10-CM

## 2024-01-05 DIAGNOSIS — M436 Torticollis: Secondary | ICD-10-CM

## 2024-01-05 DIAGNOSIS — M25612 Stiffness of left shoulder, not elsewhere classified: Secondary | ICD-10-CM

## 2024-01-05 DIAGNOSIS — R29898 Other symptoms and signs involving the musculoskeletal system: Secondary | ICD-10-CM

## 2024-01-05 NOTE — Therapy (Unsigned)
 OUTPATIENT PHYSICAL treatment   Patient Name: Kathryn Lucas MRN: [id number] DOB:04/14/1960, 63 y.o., female Today's Date: 01/05/2024   PCP: Vernon Velna SAUNDERS, MD  REFERRING PROVIDER: Odean Potts, MD   END OF SESSION:  PT End of Session - 01/05/24 1630     Visit Number 6    Number of Visits 16    Date for Recertification  01/19/24    PT Start Time 1624    PT Stop Time 1702    PT Time Calculation (min) 38 min    Equipment Utilized During Treatment Gait belt    Activity Tolerance Patient tolerated treatment well    Behavior During Therapy WFL for tasks assessed/performed          Past Medical History:  Diagnosis Date   Breast cancer (HCC)    left breast cancer   Cancer (HCC) 09/2017   left breast cancer   Family history of breast cancer    Hypothyroidism    Personal history of radiation therapy 2019   Thyroid  disease    Past Surgical History:  Procedure Laterality Date   BREAST BIOPSY Left 2018   BREAST BIOPSY Left 2019   BREAST RECONSTRUCTION WITH PLACEMENT OF TISSUE EXPANDER AND ALLODERM Left 10/26/2017   Procedure: LEFT BREAST RECONSTRUCTION WITH PLACEMENT OF TISSUE EXPANDER AND ALLODERM;  Surgeon: Arelia Filippo, MD;  Location: [facility];  Service: Plastics;  Laterality: Left;   IR IMAGING GUIDED PORT INSERTION  04/27/2023   MASTECTOMY Left 2019   MASTECTOMY WITH RADIOACTIVE SEED GUIDED EXCISION AND AXILLARY SENTINEL LYMPH NODE BIOPSY Left 10/26/2017   Procedure: LEFT MASTECTOMY WITH SEED TARGETED  LEFT AXILLARY LYMPH NODE EXCISION AND LEFT SENTINEL LYMPH NODE BIOPSY;  Surgeon: Aron Shoulders, MD;  Location: [facility];  Service: General;  Laterality: Left;   TONSILLECTOMY     WISDOM TOOTH EXTRACTION     Patient Active Problem List   Diagnosis Date Noted   Focal seizure (HCC) 09/02/2023   Metastatic 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) 10/14/2022   Elevated liver enzymes 10/14/2022   Unexplained weight loss 10/14/2022   Neoplasm  related pain 10/14/2022   Metastatic malignant neoplasm (HCC) 12/19/2021   Family history of breast cancer    History of therapeutic radiation 04/27/2018   Breast cancer, left breast (HCC) 10/26/2017   Malignant neoplasm of lower-outer quadrant of left breast of female, estrogen receptor positive (HCC) 07/15/2016   Plantar fasciitis 07/30/2015   Metatarsalgia 10/18/2014    ONSET DATE: September 2023   REFERRING DIAG:  Diagnosis  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  Reports that she is doing well. No pain reported by pt. But still has a little cervical stiffness.  States that her her shoulder is even moving a little better.   From Eval: Pt arrives at PT after prolonged hiatus from PT due to progress reports at that time. Reports that she  had restarted CA treatment via radiation to address metastases of skull and Spine. Completed radiation therapy  in September. Scans show improvement in cerebral metastases .   Pt reports that she feels like her legs and arms are weaker now after completing recent Radiation treatment. Wants to be able to stand in one place for increased time.   Pt accompanied by: none.   PERTINENT HISTORY:   Kathryn Lucas is a 63 year old female with metastatic cancer who presents for follow-up on her recent CT scans and lab results.   Her recent CT scans show stable metastatic  lesions in the liver, bones, lymph nodes, and soft tissue thickening in the back, with resolution of skull lesions.   Pt had exacerbation of s/s in June of 2024 with hospitalization and subsequent change in oncology treatment limiting ability to attend PT. Pt reports recent decline again in function following radiation in summer 2025.   PAIN:  Are you having pain? No  PRECAUTIONS: Cervical and Other: cervical for comfort   RED FLAGS: None   WEIGHT BEARING RESTRICTIONS: No  FALLS: Has patient fallen in last 6 months? Yes. Number of falls 1  LIVING ENVIRONMENT: Lives with: lives alone Lives in: House/apartment Stairs: yes, but uses ramp  Has following equipment at home: Walker - 4 wheeled  PLOF: Needs assistance with ADLs and Needs assistance with homemaking  PATIENT GOALS: improve balance, strength and endurance. Stand at kitchen longer and   OBJECTIVE:   DIAGNOSTIC FINDINGS:  11/17/2023  CT head IMPRESSION: 1. No acute intracranial abnormality. 2. 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 3. Stable heterogeneous lesion involving the right frontal calvarium and additional scattered lytic lesions throughout the visualized calvarium. 4. Decreased soft tissue along the lateral aspect of the right globe, possibly representing mild prominence of the lacrimal gland. The orbits are otherwise unremarkable.  CT chest/abdomin  IMPRESSION: 1. Bilobar hepatic metastasis are similar to recent PET-CT but progressed from CT May 11, 2023. 2. Extensive osseous metastatic disease involving the axial and proximal appendicular skeleton with a few areas showing increased sclerosis which may reflect treatment response but is nonspecific. Suggest attention on follow-up imaging. 3. Stable right axillary and right external iliac lymph nodes. 4. Subcutaneous/cutaneous nodular soft tissue thickening  in the posterior upper back which was hypermetabolic on prior PET-CT is similar to that examination, compatible with disease involvement. 5. Gas fluid levels in a patulous esophagus, suggestive of gastroesophageal reflux. 6. Colonic stool burden compatible with constipation.   7/2 PET  IMPRESSION: 1. Overall disease progression as evidenced by increase in the hypermetabolic right axillary lymph nodes, new hypermetabolic mediastinal adenopathy and new hepatic metastases. 2. New left cervical level II lymph node, worrisome for metastatic disease. 3. Minimal improvement in right external iliac adenopathy. 4. Overall minimal improvement in osseous metastatic disease.   6/6:  Head CT  IMPRESSION: 1. No acute intracranial abnormality. 2. Extensive, predominantly stable areas of osseous metastasis throughout the calvarium. 3. New area of enhancing soft tissue attenuation along the lateral aspect of the right globe, as described above. MRI  correlation is recommended. 4. Multiple areas of soft tissue attenuation throughout the scalp, mildly increased in size and number when compared to the prior study. Mild interval progression of metastatic disease cannot be excluded.    COGNITION: Overall cognitive status: Within functional limits for tasks assessed   SENSATION: WFL  COORDINATION: Limited due to ROM deficits. WFL    MUSCLE TONE: WFL    POSTURE: rounded shoulders, forward head, and decreased lumbar lordosis  LOWER EXTREMITY MMT:   MMT  Right Eval Left Eval  Hip flexion 4 4  Hip extension    Hip abduction 4 4  Hip adduction 4+ 4+  Hip internal rotation    Hip external rotation    Knee flexion 4+ 4+  Knee extension 4+ 4+  Ankle dorsiflexion 5 5  Ankle plantarflexion 4 4  Ankle inversion    Ankle eversion     (Blank rows = not tested) MMT  Right Eval Left Eval  Shoulder  flexion 4- 4  shoulder extension 4+ 4+  Shoulder abduction 4 4  Shoulder adduction 4+  4+  Shoulder  internal rotation 4- 4  Shoulder external rotation 4+ 4+  Elbow flexion 4 4+  Elbow extension 4+ 4+                    LOWER EXTREMITY ROM:     Limited shoulder flexion and abduction to ~ 85 DEG  on the R shoulder   BED MOBILITY:  Sit to supine SBA Supine to sit SBA  TRANSFERS: Assistive device utilized: None  Sit to stand: Modified independence Stand to sit: Modified independence Chair to chair: Modified independence Floor: Modified independence   CURB:  Level of Assistance: CGA Assistive device utilized: Youth worker Comments: step to pattern  STAIRS: Level of Assistance: CGA Stair Negotiation Technique: Step to Pattern with Bilateral Rails Number of Stairs: 4  Height of Stairs: 6  Comments: step to with BUE support   GAIT: Gait pattern: step through pattern genu recurvatum on the RLE.   Distance walked: 143ft Assistive device utilized: None Level of assistance: SBA Comments: no circumduction, greatly improved since last bout of PT treatment.   FUNCTIONAL TESTS:  5 times sit to stand: 16.57 sec with UE support   Timed up and go (TUG): 9.4 sec   6 minute walk test: 1090 10 meter walk test:  9.55 sec (1.45m/s)  Berg Balance Scale: 47/56 Functional gait assessment: 19/30   PATIENT SURVEYS:  ABC scale: The Activities-Specific Balance Confidence (ABC) Scale 0% 10 20 30  40 50 60 70 80 90 100% No confidence<->completely confident  "How confident are you that you will not lose your balance or become unsteady when you . . .   Date tested 11/24/23   Walk around the house 100%  2. Walk up or down stairs 100%  3. Bend over and pick up a slipper from in front of a closet floor 100%  4. Reach for a small can off a shelf at eye level 100%  5. Stand on tip toes and reach for something above your head 100%  6. Stand on a chair and reach for something 60%  7. Sweep the floor 100%  8. Walk outside the house to a car parked in the driveway 100%  9. Get  into or out of a car 100%  10. Walk across a parking lot to the mall 100%  11. Walk up or down a ramp 100%  12. Walk in a crowded  mall where people rapidly walk past you 100%  13. Are bumped into by people as you walk through the mall 80%  14. Step onto or off of an escalator while you are holding onto the railing 100%  15. Step onto or off an escalator while holding onto parcels such that you cannot hold onto the railing 80%  16. Walk outside on icy sidewalks 70%  Total: #/16  93%     [facility] PT Assessment - 12/08/23 0001       Assessment   Hand Dominance Right      Berg Balance Test   Sit to Stand Able to stand  independently using hands    Standing Unsupported Able to stand safely 2 minutes    Sitting with Back Unsupported but Feet Supported on Floor or Stool Able to sit safely and securely 2 minutes    Stand to Sit Controls descent by using hands    Transfers Able to transfer safely, definite need of hands    Standing Unsupported with Eyes Closed Able to stand 10 seconds safely    Standing Unsupported with Feet Together Able to place feet together independently and stand 1 minute safely    From Standing, Reach Forward with Outstretched Arm Can reach forward >12 cm safely (5)    From Standing Position, Pick up Object from Floor Able to pick up shoe safely and easily    From Standing Position, Turn to Look Behind Over each Shoulder Looks behind one side only/other side shows less weight shift    Turn 360 Degrees Able to turn 360 degrees safely one side only in 4 seconds or less    Standing Unsupported, Alternately Place Feet on Step/Stool Able to stand independently and safely and complete 8 steps in 20 seconds    Standing Unsupported, One Foot in Front Able to plae foot ahead of the other independently and hold 30 seconds    Standing on One Leg Able to lift leg independently and hold equal to or more than 3 seconds      Functional Gait  Assessment   Gait Level Surface Walks 20 ft in  less than 5.5 sec, no assistive devices, good speed, no evidence for imbalance, normal gait pattern, deviates no more than 6 in outside of the 12 in walkway width.    Change in Gait Speed Able to smoothly change walking speed without loss of balance or gait deviation. Deviate no more than 6 in outside of the 12 in walkway width.    Gait with Horizontal Head Turns Performs head turns smoothly with slight change in gait velocity (eg, minor disruption to smooth gait path), deviates 6-10 in outside 12 in walkway width, or uses an assistive device.    Gait with Vertical Head Turns Performs head turns with no change in gait. Deviates no more than 6 in outside 12 in walkway width.    Gait and Pivot Turn Pivot turns safely within 3 sec and stops quickly with no loss of balance.    Step Over Obstacle Is able to step over one shoe box (4.5 in total height) but must slow down and adjust steps to clear box safely. May require verbal cueing.    Gait with Narrow Base of Support Ambulates less than 4 steps heel to toe or cannot perform without assistance.    Gait with Eyes Closed Walks 20 ft, slow speed, abnormal gait pattern, evidence for imbalance, deviates 10-15 in outside 12 in walkway width. Requires more than  9 sec to ambulate 20 ft.    Ambulating Backwards Walks 20 ft, slow speed, abnormal gait pattern, evidence for imbalance, deviates 10-15 in outside 12 in walkway width.    Steps Alternating feet, must use rail.          TODAY'S TREATMENT:                                                                                                                              DATE: 01/05/2024  Nustep reciprocal movement training x 10 min. Rolling hill program level 2-7. Able to use BUE throughout entire 10 min with minimal discomfort per pt report  Sit<>stand with BUE clasp and lightly pushing from thighs 2 x 8    Lateral step ups onto 6 inch step x 10 bil   Tandem gait with no UE support 9ft x 6   Forward  stair ascent 4 x 4 with reciprocal pattern with light UE support progressing to no UE support on descent. Mild tremble noted in the R quad initially for eccentic control, but improved with repetitions.    Forward lunge onto second step of stair x 12 bil with UE assist with press through BUE on rails to return to starting position.   Multiple therapuetic rest break throughout session due to BLE fatigue   PATIENT EDUCATION: Education details: Pt educated throughout session about proper posture and technique with exercises. Improved exercise technique, movement at target joints, use of target muscles after min to mod verbal, visual, tactile cues.  Person educated: Patient Education method: Explanation Education comprehension: verbalized understanding  HOME EXERCISE PROGRAM:  Access Code: [email or web address] URL: [email or web address] Date: 12/08/2023 Prepared by: Massie Dollar  Exercises - Sit to Stand with Armchair  - 1 x daily - 7 x weekly - 3 sets - 10 reps - Seated Long Arc Quad  - 1 x daily - 7 x weekly - 3 sets - 10 reps - Standing March with Counter Support  - 1 x daily - 7 x weekly - 3 sets - 10 reps - Seated March  - 1 x daily - 7 x weekly - 3 sets - 10 reps - Side Stepping with Counter Support  - 1 x daily - 7 x weekly - 3 sets - 10 reps - Seated Heel Raise  - 1 x daily - 7 x weekly - 3 sets - 10 reps - Seated Toe Raise  - 1 x daily - 7 x weekly - 3 sets - 10 repsit.   GOALS: Goals reviewed with patient? Yes  SHORT TERM GOALS: Target date: 12/22/2023   Patient will be independent in home exercise program to improve strength/mobility for better functional independence with ADLs. Baseline: to provide at session 1 or 2  Goal status: INITIAL   LONG TERM GOALS: Target date: 01/19/2024    Patient will increase LEFS score to equal to or greater than 8 points   to demonstrate statistically significant improvement in  mobility and quality of life.  Baseline:  to be  completed   Goal status: INITIAL  2.  Patient (> 21 years old) will complete five times sit to stand test in < 15 seconds without use of BUE indicating an increased LE strength and improved balance. Baseline: 16.57 sec with UE supported on arm rest  Goal status: INITIAL  3.  Patient will increase Berg Balance score by > 6 points to demonstrate decreased fall risk during functional activities Baseline: 47/56 Goal status: INITIAL  4.  Patient will increase 6 min walk test to >1236ft as to improve gait speed for better community ambulation and to reduce fall risk. Baseline:1063ft Goal status: INITIAL    5.  Patient will ascend and descent stairs without UE support to improve independence with access to home and community   Goal status: INITIAL  6.  Patient will increase FGA score to >22 as to demonstrate reduced fall risk and improved dynamic gait balance for better safety with community/home ambulation. Baseline:19 Goal status: INITIAL   ASSESSMENT:  CLINICAL IMPRESSION: Pt put forth excellent effort for PT treatment to  address functional deficits listed above secondary to continued CA treatment.  PT treatment focused on BLE strength, and improved functional movement patterns. Was noted to have improved confidence with stair descent and reduced use of BUE to push from thighs to stand compared to prior session. Pt will benefit from skilled PT treatment  to address balance, strength and ROM deficits, improve safety and QoL while continuing CA treatment.    OBJECTIVE IMPAIRMENTS: Abnormal gait, cardiopulmonary status limiting activity, decreased activity tolerance, decreased balance, decreased endurance, decreased knowledge of condition, decreased mobility, difficulty walking, decreased ROM, decreased strength, hypomobility, increased fascial restrictions, impaired perceived functional ability, impaired flexibility, impaired UE functional use, improper body mechanics, postural dysfunction,  and pain.   ACTIVITY LIMITATIONS: carrying, lifting, bending, standing, squatting, sleeping, stairs, transfers, bathing, toileting, dressing, reach over head, and locomotion level  PARTICIPATION LIMITATIONS: cleaning, laundry, driving, shopping, community activity, and yard work  PERSONAL FACTORS: Age, Behavior pattern, Fitness, Past/current experiences, and 1-2 comorbidities: CA with profound metastases  are also affecting patient's functional outcome.   REHAB POTENTIAL: Good  CLINICAL DECISION MAKING: Evolving/moderate complexity  EVALUATION COMPLEXITY: Moderate  PLAN:  PT FREQUENCY: 1-2x/week  PT DURATION: 8 weeks  PLANNED INTERVENTIONS: Therapeutic exercises, Therapeutic activity, Neuromuscular re-education, Balance training, Gait training, Patient/Family education, Self Care, Joint mobilization, Stair training, DME instructions, Cryotherapy, Moist heat, and Manual therapy  PLAN FOR NEXT SESSION:   Generalized strengthening for BUE/BLE. UE/cervical ROM as appropriate.     Massie FORBES Dollar, PT 01/05/2024, 4:31 PM

## 2024-01-07 ENCOUNTER — Encounter: Payer: Self-pay | Admitting: Hematology and Oncology

## 2024-01-07 ENCOUNTER — Encounter: Admitting: Physician Assistant

## 2024-01-12 ENCOUNTER — Other Ambulatory Visit (HOSPITAL_COMMUNITY): Payer: Self-pay

## 2024-01-12 ENCOUNTER — Ambulatory Visit: Admitting: Physical Therapy

## 2024-01-12 ENCOUNTER — Encounter: Admitting: Physician Assistant

## 2024-01-12 DIAGNOSIS — M436 Torticollis: Secondary | ICD-10-CM

## 2024-01-12 DIAGNOSIS — R29898 Other symptoms and signs involving the musculoskeletal system: Secondary | ICD-10-CM

## 2024-01-12 DIAGNOSIS — M25611 Stiffness of right shoulder, not elsewhere classified: Secondary | ICD-10-CM

## 2024-01-12 DIAGNOSIS — M25612 Stiffness of left shoulder, not elsewhere classified: Secondary | ICD-10-CM

## 2024-01-12 DIAGNOSIS — M542 Cervicalgia: Secondary | ICD-10-CM

## 2024-01-12 DIAGNOSIS — R262 Difficulty in walking, not elsewhere classified: Secondary | ICD-10-CM

## 2024-01-12 DIAGNOSIS — M6281 Muscle weakness (generalized): Secondary | ICD-10-CM | POA: Diagnosis not present

## 2024-01-12 DIAGNOSIS — C44599 Other specified malignant neoplasm of skin of other part of trunk: Secondary | ICD-10-CM | POA: Diagnosis not present

## 2024-01-12 NOTE — Therapy (Signed)
 OUTPATIENT PHYSICAL treatment   Patient Name: Kathryn Lucas MRN: [id number] DOB:19-Feb-1961, 63 y.o., female Today's Date: 01/12/2024   PCP: Kathryn Velna SAUNDERS, MD  REFERRING PROVIDER: Odean Potts, MD   END OF SESSION:  PT End of Session - 01/12/24 1556     Visit Number 7    Number of Visits 16    Date for Recertification  01/19/24    PT Start Time 1505    PT Stop Time 1530    PT Time Calculation (min) 25 min    Equipment Utilized During Treatment Gait belt    Activity Tolerance Patient tolerated treatment well    Behavior During Therapy [facility] for tasks assessed/performed          Past Medical History:  Diagnosis Date   Breast cancer (HCC)    left breast cancer   Cancer (HCC) 09/2017   left breast cancer   Family history of breast cancer    Hypothyroidism    Personal history of radiation therapy 2019   Thyroid  disease    Past Surgical History:  Procedure Laterality Date   BREAST BIOPSY Left 2018   BREAST BIOPSY Left 2019   BREAST RECONSTRUCTION WITH PLACEMENT OF TISSUE EXPANDER AND ALLODERM Left 10/26/2017   Procedure: LEFT BREAST RECONSTRUCTION WITH PLACEMENT OF TISSUE EXPANDER AND ALLODERM;  Surgeon: Kathryn Filippo, MD;  Location: [facility];  Service: Plastics;  Laterality: Left;   IR IMAGING GUIDED PORT INSERTION  04/27/2023   MASTECTOMY Left 2019   MASTECTOMY WITH RADIOACTIVE SEED GUIDED EXCISION AND AXILLARY SENTINEL LYMPH NODE BIOPSY Left 10/26/2017   Procedure: LEFT MASTECTOMY WITH SEED TARGETED  LEFT AXILLARY LYMPH NODE EXCISION AND LEFT SENTINEL LYMPH NODE BIOPSY;  Surgeon: Kathryn Shoulders, MD;  Location: [facility];  Service: General;  Laterality: Left;   TONSILLECTOMY     WISDOM TOOTH EXTRACTION     Patient Active Problem List   Diagnosis Date Noted   Focal seizure (HCC) 09/02/2023   Metastatic 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) 10/14/2022   Elevated liver enzymes 10/14/2022   Unexplained weight loss 10/14/2022   Neoplasm  related pain 10/14/2022   Metastatic malignant neoplasm (HCC) 12/19/2021   Family history of breast cancer    History of therapeutic radiation 04/27/2018   Breast cancer, left breast (HCC) 10/26/2017   Malignant neoplasm of lower-outer quadrant of left breast of female, estrogen receptor positive (HCC) 07/15/2016   Plantar fasciitis 07/30/2015   Metatarsalgia 10/18/2014    ONSET DATE: September 2023   REFERRING DIAG:  Diagnosis  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  Late arrival to PT due to misplacing keys. Reports that she is doing well. No pain reported by pt. But still has a little cervical stiffness.    From Eval: Pt arrives at PT after prolonged hiatus from PT due to progress reports at that time. Reports that she  had restarted CA treatment via radiation to address metastases of skull and Spine. Completed radiation therapy  in September. Scans show improvement in cerebral metastases .   Pt reports that she feels like her legs and arms are weaker now after completing recent Radiation treatment. Wants to be able to stand in one place for increased time.   Pt accompanied by: none.   PERTINENT HISTORY:   Kathryn Lucas is a 63 year old female with metastatic cancer who presents for follow-up on her recent CT scans and lab results.   Her recent CT scans show stable metastatic lesions in the liver,  bones, lymph nodes, and soft tissue thickening in the back, with resolution of skull lesions.   Pt had exacerbation of s/s in June of 2024 with hospitalization and subsequent change in oncology treatment limiting ability to attend PT. Pt reports recent decline again in function following radiation in summer 2025.   PAIN:  Are you having pain? No  PRECAUTIONS: Cervical and Other: cervical for comfort   RED FLAGS: None   WEIGHT BEARING RESTRICTIONS: No  FALLS: Has patient fallen in last 6 months? Yes. Number of falls 1  LIVING ENVIRONMENT: Lives with: lives alone Lives in: House/apartment Stairs: yes, but uses ramp  Has following equipment at home: Walker - 4 wheeled  PLOF: Needs assistance with ADLs and Needs assistance with homemaking  PATIENT GOALS: improve balance, strength and endurance. Stand at kitchen longer and   OBJECTIVE:   DIAGNOSTIC FINDINGS:  11/17/2023  CT head IMPRESSION: 1. No acute intracranial abnormality. 2. 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 3. Stable heterogeneous lesion involving the right frontal calvarium and additional scattered lytic lesions throughout the visualized calvarium. 4. Decreased soft tissue along the lateral aspect of the right globe, possibly representing mild prominence of the lacrimal gland. The orbits are otherwise unremarkable.  CT chest/abdomin  IMPRESSION: 1. Bilobar hepatic metastasis are similar to recent PET-CT but progressed from CT May 11, 2023. 2. Extensive osseous metastatic disease involving the axial and proximal appendicular skeleton with a few areas showing increased sclerosis which may reflect treatment response but is nonspecific. Suggest attention on follow-up imaging. 3. Stable right axillary and right external iliac lymph nodes. 4. Subcutaneous/cutaneous nodular soft tissue thickening in the posterior upper  back which was hypermetabolic on prior PET-CT is similar to that examination, compatible with disease involvement. 5. Gas fluid levels in a patulous esophagus, suggestive of gastroesophageal reflux. 6. Colonic stool burden compatible with constipation.   7/2 PET  IMPRESSION: 1. Overall disease progression as evidenced by increase in the hypermetabolic right axillary lymph nodes, new hypermetabolic mediastinal adenopathy and new hepatic metastases. 2. New left cervical level II lymph node, worrisome for metastatic disease. 3. Minimal improvement in right external iliac adenopathy. 4. Overall minimal improvement in osseous metastatic disease.   6/6:  Head CT  IMPRESSION: 1. No acute intracranial abnormality. 2. Extensive, predominantly stable areas of osseous metastasis throughout the calvarium. 3. New area of enhancing soft tissue attenuation along the lateral aspect of the right globe, as described above. MRI correlation is recommended.  4. Multiple areas of soft tissue attenuation throughout the scalp, mildly increased in size and number when compared to the prior study. Mild interval progression of metastatic disease cannot be excluded.    COGNITION: Overall cognitive status: Within functional limits for tasks assessed   SENSATION: WFL  COORDINATION: Limited due to ROM deficits. WFL    MUSCLE TONE: WFL    POSTURE: rounded Lucas, forward head, and decreased lumbar lordosis  LOWER EXTREMITY MMT:   MMT  Right Eval Left Eval  Hip flexion 4 4  Hip extension    Hip abduction 4 4  Hip adduction 4+ 4+  Hip internal rotation    Hip external rotation    Knee flexion 4+ 4+  Knee extension 4+ 4+  Ankle dorsiflexion 5 5  Ankle plantarflexion 4 4  Ankle inversion    Ankle eversion     (Blank rows = not tested) MMT  Right Eval Left Eval  Shoulder  flexion 4- 4  shoulder extension 4+ 4+  Shoulder abduction 4 4  Shoulder adduction 4+ 4+  Shoulder  internal  rotation 4- 4  Shoulder external rotation 4+ 4+  Elbow flexion 4 4+  Elbow extension 4+ 4+                    LOWER EXTREMITY ROM:     Limited shoulder flexion and abduction to ~ 85 DEG  on the R shoulder   BED MOBILITY:  Sit to supine SBA Supine to sit SBA  TRANSFERS: Assistive device utilized: None  Sit to stand: Modified [place] Stand to sit: Modified [place] Chair to chair: Modified [place] Floor: Modified independence   CURB:  Level of Assistance: CGA Assistive device utilized: Youth Worker Comments: step to pattern  STAIRS: Level of Assistance: CGA Stair Negotiation Technique: Step to Pattern with Bilateral Rails Number of Stairs: 4  Height of Stairs: 6  Comments: step to with BUE support   GAIT: Gait pattern: step through pattern genu recurvatum on the RLE.   Distance walked: 139ft Assistive device utilized: None Level of assistance: SBA Comments: no circumduction, greatly improved since last bout of PT treatment.   FUNCTIONAL TESTS:  5 times sit to stand: 16.57 sec with UE support   Timed up and go (TUG): 9.4 sec   6 minute walk test: 1090 10 meter walk test:  9.55 sec (1.44m/s)  Berg Balance Scale: 47/56 Functional gait assessment: 19/30   PATIENT SURVEYS:  ABC scale: The Activities-Specific Balance Confidence (ABC) Scale 0% 10 20 30  40 50 60 70 80 90 100% No confidence<->completely confident  "How confident are you that you will not lose your balance or become unsteady when you . . .   Date tested 11/24/23   Walk around the house 100%  2. Walk up or down stairs 100%  3. Bend over and pick up a slipper from in front of a closet floor 100%  4. Reach for a small can off a shelf at eye level 100%  5. Stand on tip toes and reach for something above your head 100%  6. Stand on a chair and reach for something 60%  7. Sweep the floor 100%  8. Walk outside the house to a car parked in the driveway 100%  9. Get into or out of a car 100%   10. Walk across a parking lot to the mall 100%  11. Walk up or down a ramp 100%  12. Walk in a crowded mall where people  rapidly walk past you 100%  13. Are bumped into by people as you walk through the mall 80%  14. Step onto or off of an escalator while you are holding onto the railing 100%  15. Step onto or off an escalator while holding onto parcels such that you cannot hold onto the railing 80%  16. Walk outside on icy sidewalks 70%  Total: #/16  93%     [facility] PT Assessment - 12/08/23 0001       Assessment   Hand Dominance Right      Berg Balance Test   Sit to Stand Able to stand  independently using hands    Standing Unsupported Able to stand safely 2 minutes    Sitting with Back Unsupported but Feet Supported on Floor or Stool Able to sit safely and securely 2 minutes    Stand to Sit Controls descent by using hands    Transfers Able to transfer safely, definite need of hands    Standing Unsupported with Eyes Closed Able to stand 10 seconds safely    Standing Unsupported with Feet Together Able to place feet together independently and stand 1 minute safely    From Standing, Reach Forward with Outstretched Arm Can reach forward >12 cm safely (5)    From Standing Position, Pick up Object from Floor Able to pick up shoe safely and easily    From Standing Position, Turn to Look Behind Over each Shoulder Looks behind one side only/other side shows less weight shift    Turn 360 Degrees Able to turn 360 degrees safely one side only in 4 seconds or less    Standing Unsupported, Alternately Place Feet on Step/Stool Able to stand independently and safely and complete 8 steps in 20 seconds    Standing Unsupported, One Foot in Front Able to plae foot ahead of the other independently and hold 30 seconds    Standing on One Leg Able to lift leg independently and hold equal to or more than 3 seconds      Functional Gait  Assessment   Gait Level Surface Walks 20 ft in less than 5.5 sec, no  assistive devices, good speed, no evidence for imbalance, normal gait pattern, deviates no more than 6 in outside of the 12 in walkway width.    Change in Gait Speed Able to smoothly change walking speed without loss of balance or gait deviation. Deviate no more than 6 in outside of the 12 in walkway width.    Gait with Horizontal Head Turns Performs head turns smoothly with slight change in gait velocity (eg, minor disruption to smooth gait path), deviates 6-10 in outside 12 in walkway width, or uses an assistive device.    Gait with Vertical Head Turns Performs head turns with no change in gait. Deviates no more than 6 in outside 12 in walkway width.    Gait and Pivot Turn Pivot turns safely within 3 sec and stops quickly with no loss of balance.    Step Over Obstacle Is able to step over one shoe box (4.5 in total height) but must slow down and adjust steps to clear box safely. May require verbal cueing.    Gait with Narrow Base of Support Ambulates less than 4 steps heel to toe or cannot perform without assistance.    Gait with Eyes Closed Walks 20 ft, slow speed, abnormal gait pattern, evidence for imbalance, deviates 10-15 in outside 12 in walkway width. Requires more than 9 sec to  ambulate 20 ft.    Ambulating Backwards Walks 20 ft, slow speed, abnormal gait pattern, evidence for imbalance, deviates 10-15 in outside 12 in walkway width.    Steps Alternating feet, must use rail.          TODAY'S TREATMENT:                                                                                                                              DATE: 01/12/2024  Nustep reciprocal movement training x 9 min. Rolling hill program level 2-7. Able to use BUE throughout only intermittently throughout due to R shoulder pain/tightness.    Reports increased cervical stiffness and pain on this day compared to proper session.   PT performed STM to Bil cervical paraspinals, UT and suboccipitals for pain management  and improved muscle extensibility.  X 10 min with multiple noted concordant TrP.  UT stretch in pain free range 2 x 30 sec bil  Cervical rotation stretch 2 x 30 sec bil  Reports improved ROM and decreased cervical pain  upon completion.    PATIENT EDUCATION: Education details: Pt educated throughout session about proper posture and technique with exercises. Improved exercise technique, movement at target joints, use of target muscles after min to mod verbal, visual, tactile cues.  Person educated: Patient Education method: Explanation Education comprehension: verbalized understanding  HOME EXERCISE PROGRAM:  Access Code: [email or web address] URL: [email or web address] Date: 12/08/2023 Prepared by: Massie Dollar  Exercises - Sit to Stand with Armchair  - 1 x daily - 7 x weekly - 3 sets - 10 reps - Seated Long Arc Quad  - 1 x daily - 7 x weekly - 3 sets - 10 reps - Standing March with Counter Support  - 1 x daily - 7 x weekly - 3 sets - 10 reps - Seated March  - 1 x daily - 7 x weekly - 3 sets - 10 reps - Side Stepping with Counter Support  - 1 x daily - 7 x weekly - 3 sets - 10 reps - Seated Heel Raise  - 1 x daily - 7 x weekly - 3 sets - 10 reps - Seated Toe Raise  - 1 x daily - 7 x weekly - 3 sets - 10 repsit.   GOALS: Goals reviewed with patient? Yes  SHORT TERM GOALS: Target date: 12/22/2023   Patient will be independent in home exercise program to improve strength/mobility for better functional independence with ADLs. Baseline: to provide at session 1 or 2  Goal status: INITIAL   LONG TERM GOALS: Target date: 01/19/2024    Patient will increase LEFS score to equal to or greater than 8 points   to demonstrate statistically significant improvement in mobility and quality of life.  Baseline:  to be completed   Goal status: INITIAL  2.  Patient (> 5 years old) will complete five times sit to stand test in < 15 seconds without use of BUE  indicating an increased LE  strength and improved balance. Baseline: 16.57 sec with UE supported on arm rest  Goal status: INITIAL  3.  Patient will increase Berg Balance score by > 6 points to demonstrate decreased fall risk during functional activities Baseline: 47/56 Goal status: INITIAL  4.  Patient will increase 6 min walk test to >1271ft as to improve gait speed for better community ambulation and to reduce fall risk. Baseline:1045ft Goal status: INITIAL    5.  Patient will ascend and descent stairs without UE support to improve independence with access to home and community   Goal status: INITIAL  6.  Patient will increase FGA score to >22 as to demonstrate reduced fall risk and improved dynamic gait balance for better safety with community/home ambulation. Baseline:19 Goal status: INITIAL   ASSESSMENT:  CLINICAL IMPRESSION: Pt put forth excellent effort for PT treatment to  address functional deficits listed above secondary to continued CA treatment.  PT treatment focused BUE ROM and activation as well as improved ROM and pain management for cspine. States that she feels like her neck is moving a little better upon completion. Pt will benefit from skilled PT treatment  to address balance, strength and ROM deficits, improve safety and QoL while continuing CA treatment.    OBJECTIVE IMPAIRMENTS: Abnormal gait, cardiopulmonary status limiting activity, decreased activity tolerance, decreased balance, decreased endurance, decreased knowledge of condition, decreased mobility, difficulty walking, decreased ROM, decreased strength, hypomobility, increased fascial restrictions, impaired perceived functional ability, impaired flexibility, impaired UE functional use, improper body mechanics, postural dysfunction, and pain.   ACTIVITY LIMITATIONS: carrying, lifting, bending, standing, squatting, sleeping, stairs, transfers, bathing, toileting, dressing, reach over head, and locomotion level  PARTICIPATION  LIMITATIONS: cleaning, laundry, driving, shopping, community activity, and yard work  PERSONAL FACTORS: Age, Behavior pattern, Fitness, Past/current experiences, and 1-2 comorbidities: CA with profound metastases  are also affecting patient's functional outcome.   REHAB POTENTIAL: Good  CLINICAL DECISION MAKING: Evolving/moderate complexity  EVALUATION COMPLEXITY: Moderate  PLAN:  PT FREQUENCY: 1-2x/week  PT DURATION: 8 weeks  PLANNED INTERVENTIONS: Therapeutic exercises, Therapeutic activity, Neuromuscular re-education, Balance training, Gait training, Patient/Family education, Self Care, Joint mobilization, Stair training, DME instructions, Cryotherapy, Moist heat, and Manual therapy  PLAN FOR NEXT SESSION:   Generalized strengthening for BUE/BLE. UE/cervical ROM as appropriate.     Massie FORBES Dollar, PT 01/12/2024, 4:08 PM

## 2024-01-13 ENCOUNTER — Other Ambulatory Visit (HOSPITAL_COMMUNITY): Payer: Self-pay

## 2024-01-17 ENCOUNTER — Other Ambulatory Visit: Payer: Self-pay

## 2024-01-17 ENCOUNTER — Other Ambulatory Visit (HOSPITAL_COMMUNITY): Payer: Self-pay

## 2024-01-17 NOTE — Progress Notes (Signed)
 Specialty Pharmacy Refill Coordination Note  Kathryn Lucas is a 63 y.o. female contacted today regarding refills of specialty medication(s) Capecitabine  (XELODA )   Patient requested Delivery   Delivery date: 01/27/24   Verified address: [address],  [place]  [place] [address]   Medication will be filled on: 01/26/24

## 2024-01-17 NOTE — Progress Notes (Signed)
 Clinical Intervention Note  Clinical Intervention Notes: Patient reported being on a course of Bactrim DS for a cyst on her neck. No DDIs identified with xeloda .   Clinical Intervention Outcomes: Prevention of an adverse drug event   Advertising Account Planner

## 2024-01-19 ENCOUNTER — Encounter: Attending: Physician Assistant | Admitting: Physician Assistant

## 2024-01-19 ENCOUNTER — Ambulatory Visit: Attending: Hematology and Oncology | Admitting: Physical Therapy

## 2024-01-19 ENCOUNTER — Encounter: Payer: Self-pay | Admitting: Hematology and Oncology

## 2024-01-19 DIAGNOSIS — M542 Cervicalgia: Secondary | ICD-10-CM | POA: Insufficient documentation

## 2024-01-19 DIAGNOSIS — R262 Difficulty in walking, not elsewhere classified: Secondary | ICD-10-CM | POA: Insufficient documentation

## 2024-01-19 DIAGNOSIS — M25611 Stiffness of right shoulder, not elsewhere classified: Secondary | ICD-10-CM | POA: Insufficient documentation

## 2024-01-19 DIAGNOSIS — M6281 Muscle weakness (generalized): Secondary | ICD-10-CM | POA: Insufficient documentation

## 2024-01-19 DIAGNOSIS — M436 Torticollis: Secondary | ICD-10-CM | POA: Insufficient documentation

## 2024-01-19 DIAGNOSIS — M25612 Stiffness of left shoulder, not elsewhere classified: Secondary | ICD-10-CM | POA: Insufficient documentation

## 2024-01-19 DIAGNOSIS — R29898 Other symptoms and signs involving the musculoskeletal system: Secondary | ICD-10-CM | POA: Diagnosis present

## 2024-01-20 NOTE — Therapy (Signed)
 OUTPATIENT PHYSICAL treatment   Patient Name: Kathryn [facility] MRN: [id number] DOB:1960/03/17, 63 y.o., female Today's Date: 01/20/2024   PCP: Kathryn Velna SAUNDERS, MD  REFERRING PROVIDER: Odean Potts, MD   END OF SESSION:  PT End of Session - 01/19/24 2015     Visit Number 8    Number of Visits 16    Date for Recertification  01/19/24    PT Start Time 1620    PT Stop Time 1700    PT Time Calculation (min) 40 min    Equipment Utilized During Treatment Gait belt    Activity Tolerance Patient tolerated treatment well    Behavior During Therapy Kathryn Lucas for tasks assessed/performed          Past Medical History:  Diagnosis Date   Breast cancer (HCC)    left breast cancer   Cancer (HCC) 09/2017   left breast cancer   Family history of breast cancer    Hypothyroidism    Personal history of radiation therapy 2019   Thyroid  disease    Past Surgical History:  Procedure Laterality Date   BREAST BIOPSY Left 2018   BREAST BIOPSY Left 2019   BREAST RECONSTRUCTION WITH PLACEMENT OF TISSUE EXPANDER AND ALLODERM Left 10/26/2017   Procedure: LEFT BREAST RECONSTRUCTION WITH PLACEMENT OF TISSUE EXPANDER AND ALLODERM;  Surgeon: Kathryn Filippo, MD;  Location: [facility];  Service: Plastics;  Laterality: Left;   IR IMAGING GUIDED PORT INSERTION  04/27/2023   MASTECTOMY Left 2019   MASTECTOMY WITH RADIOACTIVE SEED GUIDED EXCISION AND AXILLARY SENTINEL LYMPH NODE BIOPSY Left 10/26/2017   Procedure: LEFT MASTECTOMY WITH SEED TARGETED  LEFT AXILLARY LYMPH NODE EXCISION AND LEFT SENTINEL LYMPH NODE BIOPSY;  Surgeon: Kathryn Shoulders, MD;  Location: [facility];  Service: General;  Laterality: Left;   TONSILLECTOMY     WISDOM TOOTH EXTRACTION     Patient Active Problem List   Diagnosis Date Noted   Focal seizure (HCC) 09/02/2023   Metastatic 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) 10/14/2022   Elevated liver enzymes 10/14/2022   Unexplained weight loss 10/14/2022   Neoplasm related  pain 10/14/2022   Metastatic malignant neoplasm (HCC) 12/19/2021   Family history of breast cancer    History of therapeutic radiation 04/27/2018   Breast cancer, left breast (HCC) 10/26/2017   Malignant neoplasm of lower-outer quadrant of left breast of female, estrogen receptor positive (HCC) 07/15/2016   Plantar fasciitis 07/30/2015   Metatarsalgia 10/18/2014    ONSET DATE: September 2023   REFERRING DIAG:  Diagnosis  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  Pt reports that she is doing well. States that cyst on neck it going to Burst soon. Reduced neck stiffness reported but still claim that she has difficulty sleeping on back, as she prefers to sleep on her side. Also reports that the R shoulder is still stiff, but moving better overall. Has been able to get out of chairs throughout the last week without UE support.     From Eval: Pt arrives at PT after prolonged hiatus from PT due to progress reports at that time. Reports that she  had restarted CA treatment via radiation to address metastases of skull and Spine. Completed radiation therapy  in September. Scans show improvement in cerebral metastases .   Pt reports that she feels like her legs and arms are weaker now after completing recent Radiation treatment. Wants to be able to stand in one place for increased time.   Pt accompanied by: none.    PERTINENT HISTORY:   Kathryn [facility] is a 63 year old female with metastatic cancer who presents for follow-up on her recent CT scans and lab results.   Her recent CT scans show stable metastatic lesions in the liver, bones, lymph nodes, and soft tissue thickening in the back, with resolution of skull lesions.   Pt had exacerbation of s/s in June of 2024 with hospitalization and subsequent change in oncology treatment limiting ability to attend PT. Pt reports recent decline again in function following radiation in summer 2025.   PAIN:  Are you having pain? No  PRECAUTIONS: Cervical and Other: cervical for comfort   RED FLAGS: None   WEIGHT BEARING RESTRICTIONS: No  FALLS: Has patient fallen in last 6 months? Yes. Number of falls 1  LIVING ENVIRONMENT: Lives with: lives alone Lives in: House/apartment Stairs: yes, but uses ramp  Has following equipment at home: Walker - 4 wheeled  PLOF: Needs assistance with ADLs and Needs assistance with homemaking  PATIENT GOALS: improve balance, strength and endurance. Stand at kitchen longer and   OBJECTIVE:   DIAGNOSTIC FINDINGS:  11/17/2023  CT head IMPRESSION: 1. No acute intracranial abnormality. 2. 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 3. Stable heterogeneous lesion involving the right frontal calvarium and additional scattered lytic lesions throughout the visualized calvarium. 4. Decreased soft tissue along the lateral aspect of the right globe, possibly representing mild prominence of the lacrimal gland. The orbits are otherwise unremarkable.  CT chest/abdomin  IMPRESSION: 1. Bilobar hepatic metastasis are similar to recent PET-CT but progressed from CT May 11, 2023. 2. Extensive osseous metastatic disease involving the axial and proximal appendicular skeleton with a few areas showing increased sclerosis which may reflect  treatment response but is nonspecific. Suggest attention on follow-up imaging. 3. Stable right axillary and right external iliac lymph nodes. 4. Subcutaneous/cutaneous nodular soft tissue thickening in the posterior upper back which was hypermetabolic on prior PET-CT is similar to that examination, compatible with disease involvement. 5. Gas fluid levels in a patulous esophagus, suggestive of gastroesophageal reflux. 6. Colonic stool burden compatible with constipation.   7/2 PET  IMPRESSION: 1. Overall disease progression as evidenced by increase in the hypermetabolic right axillary lymph nodes, new hypermetabolic mediastinal adenopathy and new hepatic metastases. 2. New left cervical level II lymph node, worrisome for metastatic disease. 3. Minimal improvement in right external iliac adenopathy. 4. Overall minimal improvement in osseous metastatic disease.   6/6:  Head  CT  IMPRESSION: 1. No acute intracranial abnormality. 2. Extensive, predominantly stable areas of osseous metastasis throughout the calvarium. 3. New area of enhancing soft tissue attenuation along the lateral aspect of the right globe, as described above. MRI correlation is recommended. 4. Multiple areas of soft tissue attenuation throughout the scalp, mildly increased in size and number when compared to the prior study. Mild interval progression of metastatic disease cannot be excluded.    COGNITION: Overall cognitive status: Within functional limits for tasks assessed   SENSATION: WFL  COORDINATION: Limited due to ROM deficits. WFL    MUSCLE TONE: WFL    POSTURE: rounded Lucas, forward head, and decreased lumbar lordosis  LOWER EXTREMITY MMT:   MMT  Right Eval Left Eval  Hip flexion 4 4  Hip extension    Hip abduction 4 4  Hip adduction 4+ 4+  Hip internal rotation    Hip external rotation    Knee flexion 4+ 4+  Knee extension 4+ 4+  Ankle dorsiflexion 5 5  Ankle plantarflexion  4 4  Ankle inversion    Ankle eversion     (Blank rows = not tested) MMT  Right Eval Left Eval  Shoulder  flexion 4- 4  shoulder extension 4+ 4+  Shoulder abduction 4 4  Shoulder adduction 4+ 4+  Shoulder  internal rotation 4- 4  Shoulder external rotation 4+ 4+  Elbow flexion 4 4+  Elbow extension 4+ 4+                    LOWER EXTREMITY ROM:     Limited shoulder flexion and abduction to ~ 85 DEG  on the R shoulder   BED MOBILITY:  Sit to supine SBA Supine to sit SBA  TRANSFERS: Assistive device utilized: None  Sit to stand: Modified independence Stand to sit: Modified independence Chair to chair: Modified independence Floor: Modified independence   CURB:  Level of Assistance: CGA Assistive device utilized: Youth Worker Comments: step to pattern  STAIRS: Level of Assistance: CGA Stair Negotiation Technique: Step to Pattern with Bilateral Rails Number of Stairs: 4  Height of Stairs: 6  Comments: step to with BUE support   GAIT: Gait pattern: step through pattern genu recurvatum on the RLE.   Distance walked: 155ft Assistive device utilized: None Level of assistance: SBA Comments: no circumduction, greatly improved since last bout of PT treatment.   FUNCTIONAL TESTS:  5 times sit to stand: 16.57 sec with UE support   Timed up and go (TUG): 9.4 sec   6 minute walk test: 1090 10 meter walk test:  9.55 sec (1.75m/s)  Berg Balance Scale: 47/56 Functional gait assessment: 19/30   PATIENT SURVEYS:  ABC scale: The Activities-Specific Balance Confidence (ABC) Scale 0% 10 20 30  40 50 60 70 80 90 100% No confidence<->completely confident  "How confident are you that you will not lose your balance or become unsteady when you . . .   Date tested 11/24/23   Walk around the house 100%  2. Walk up or down stairs 100%  3. Bend over and pick up a slipper from in front of a closet floor 100%  4. Reach for a small can off a shelf at eye level 100%  5. Stand on tip  toes and reach for something above your head 100%  6. Stand on a chair and reach for something 60%  7. Sweep the floor 100%  8. Walk outside the house to a car parked in  the driveway 100%  9. Get into or out of a car 100%  10. Walk across a parking lot to the mall 100%  11. Walk up or down a ramp 100%  12. Walk in a crowded mall where people rapidly walk past you 100%  13. Are bumped into by people as you walk through the mall 80%  14. Step onto or off of an escalator while you are holding onto the railing 100%  15. Step onto or off an escalator while holding onto parcels such that you cannot hold onto the railing 80%  16. Walk outside on icy sidewalks 70%  Total: #/16  93%     [facility] PT Assessment - 12/08/23 0001       Assessment   Hand Dominance Right      Berg Balance Test   Sit to Stand Able to stand  independently using hands    Standing Unsupported Able to stand safely 2 minutes    Sitting with Back Unsupported but Feet Supported on Floor or Stool Able to sit safely and securely 2 minutes    Stand to Sit Controls descent by using hands    Transfers Able to transfer safely, definite need of hands    Standing Unsupported with Eyes Closed Able to stand 10 seconds safely    Standing Unsupported with Feet Together Able to place feet together independently and stand 1 minute safely    From Standing, Reach Forward with Outstretched Arm Can reach forward >12 cm safely (5)    From Standing Position, Pick up Object from Floor Able to pick up shoe safely and easily    From Standing Position, Turn to Look Behind Over each Shoulder Looks behind one side only/other side shows less weight shift    Turn 360 Degrees Able to turn 360 degrees safely one side only in 4 seconds or less    Standing Unsupported, Alternately Place Feet on Step/Stool Able to stand independently and safely and complete 8 steps in 20 seconds    Standing Unsupported, One Foot in Front Able to plae foot ahead of the other  independently and hold 30 seconds    Standing on One Leg Able to lift leg independently and hold equal to or more than 3 seconds      Functional Gait  Assessment   Gait Level Surface Walks 20 ft in less than 5.5 sec, no assistive devices, good speed, no evidence for imbalance, normal gait pattern, deviates no more than 6 in outside of the 12 in walkway width.    Change in Gait Speed Able to smoothly change walking speed without loss of balance or gait deviation. Deviate no more than 6 in outside of the 12 in walkway width.    Gait with Horizontal Head Turns Performs head turns smoothly with slight change in gait velocity (eg, minor disruption to smooth gait path), deviates 6-10 in outside 12 in walkway width, or uses an assistive device.    Gait with Vertical Head Turns Performs head turns with no change in gait. Deviates no more than 6 in outside 12 in walkway width.    Gait and Pivot Turn Pivot turns safely within 3 sec and stops quickly with no loss of balance.    Step Over Obstacle Is able to step over one shoe box (4.5 in total height) but must slow down and adjust steps to clear box safely. May require verbal cueing.    Gait with Narrow Base of Support Ambulates less than  4 steps heel to toe or cannot perform without assistance.    Gait with Eyes Closed Walks 20 ft, slow speed, abnormal gait pattern, evidence for imbalance, deviates 10-15 in outside 12 in walkway width. Requires more than 9 sec to ambulate 20 ft.    Ambulating Backwards Walks 20 ft, slow speed, abnormal gait pattern, evidence for imbalance, deviates 10-15 in outside 12 in walkway width.    Steps Alternating feet, must use rail.          TODAY'S TREATMENT:                                                                                                                              DATE: 01/20/2024  Nustep reciprocal movement training x 8 min. Rolling hill program level 2-7. Able to intermittently use BUE throughout due to R  shoulder pain/tightness.  On this day  Forward step ups 10 inch step x 8 bil with UE support on the rail. Lateral step up/over 6inch brown step x 12 bil.   Activity Description: pod on white board. Set to end range shoulder flexion for BUE. Step up/over 6inch step to get to pods on L/R/middle of white board Activity Setting:  random Number of Pods:  3 Cycles/Sets:  2 Duration (Time or Hit Count):  21 Patient Stats  Time: 2:21 and 1:46.  Cues for lateral step up/over 6inch step and use of Proper UE to improve AROM. Mild trunkal extension on the R side to compensate for ROM deficits in flexion  Sit<>stand throughout session without UE support and on assist from PT x 8.   Due to increased sensitivity of cyst, no manual therapy provided for cervical pain on this day.   PATIENT EDUCATION: Education details: Pt educated throughout session about proper posture and technique with exercises. Improved exercise technique, movement at target joints, use of target muscles after min to mod verbal, visual, tactile cues.  Person educated: Patient Education method: Explanation Education comprehension: verbalized understanding  HOME EXERCISE PROGRAM:  Access Code: [email or web address] URL: [email or web address] Date: 12/08/2023 Prepared by: Massie Dollar  Exercises - Sit to Stand with Armchair  - 1 x daily - 7 x weekly - 3 sets - 10 reps - Seated Long Arc Quad  - 1 x daily - 7 x weekly - 3 sets - 10 reps - Standing March with Counter Support  - 1 x daily - 7 x weekly - 3 sets - 10 reps - Seated March  - 1 x daily - 7 x weekly - 3 sets - 10 reps - Side Stepping with Counter Support  - 1 x daily - 7 x weekly - 3 sets - 10 reps - Seated Heel Raise  - 1 x daily - 7 x weekly - 3 sets - 10 reps - Seated Toe Raise  - 1 x daily - 7 x weekly - 3 sets - 10 repsit.   GOALS: Goals reviewed with patient? Yes  SHORT TERM GOALS: Target date: 12/22/2023   Patient will be independent in home exercise  program to improve strength/mobility for better functional independence with ADLs. Baseline: to provide at session 1 or 2  Goal status: INITIAL   LONG TERM GOALS: Target date: 01/19/2024    Patient will increase LEFS score to equal to or greater than 8 points   to demonstrate statistically significant improvement in mobility and quality of life.  Baseline:  to be completed   Goal status: INITIAL  2.  Patient (> 25 years old) will complete five times sit to stand test in < 15 seconds without use of BUE indicating an increased LE strength and improved balance. Baseline: 16.57 sec with UE supported on arm rest  Goal status: INITIAL  3.  Patient will increase Berg Balance score by > 6 points to demonstrate decreased fall risk during functional activities Baseline: 47/56 Goal status: INITIAL  4.  Patient will increase 6 min walk test to >1290ft as to improve gait speed for better community ambulation and to reduce fall risk. Baseline:1072ft Goal status: INITIAL    5.  Patient will ascend and descent stairs without UE support to improve independence with access to home and community   Goal status: INITIAL  6.  Patient will increase FGA score to >22 as to demonstrate reduced fall risk and improved dynamic gait balance for better safety with community/home ambulation. Baseline:19 Goal status: INITIAL   ASSESSMENT:  CLINICAL IMPRESSION: Pt put forth excellent effort for PT treatment to  address functional deficits listed above secondary to continued CA treatment.  PT treatment focused on generalized strengthening and shoulder rom AAROM and AROM for reaching tasks. Multiple therapeutic rest breaks due to generalized fatigue. Pt will benefit from skilled PT treatment  to address balance, strength and ROM deficits, improve safety and QoL while continuing CA treatment.    OBJECTIVE IMPAIRMENTS: Abnormal gait, cardiopulmonary status limiting activity, decreased activity tolerance, decreased  balance, decreased endurance, decreased knowledge of condition, decreased mobility, difficulty walking, decreased ROM, decreased strength, hypomobility, increased fascial restrictions, impaired perceived functional ability, impaired flexibility, impaired UE functional use, improper body mechanics, postural dysfunction, and pain.   ACTIVITY LIMITATIONS: carrying, lifting, bending, standing, squatting, sleeping, stairs, transfers, bathing, toileting, dressing, reach over head, and locomotion level  PARTICIPATION LIMITATIONS: cleaning, laundry, driving, shopping, community activity, and yard work  PERSONAL FACTORS: Age, Behavior pattern, Fitness, Past/current experiences, and 1-2 comorbidities: CA with profound metastases  are also affecting patient's functional outcome.   REHAB POTENTIAL: Good  CLINICAL DECISION MAKING: Evolving/moderate complexity  EVALUATION COMPLEXITY: Moderate  PLAN:  PT FREQUENCY: 1-2x/week  PT DURATION: 8 weeks  PLANNED INTERVENTIONS: Therapeutic exercises, Therapeutic activity, Neuromuscular re-education, Balance training, Gait training, Patient/Family education, Self Care, Joint mobilization, Stair training, DME instructions, Cryotherapy, Moist heat, and Manual therapy  PLAN FOR NEXT SESSION:   Generalized strengthening for BUE/BLE. UE/cervical ROM as appropriate.     Massie FORBES Dollar, PT 01/20/2024, 9:16 AM

## 2024-01-21 ENCOUNTER — Encounter: Payer: Self-pay | Admitting: Hematology and Oncology

## 2024-01-26 ENCOUNTER — Other Ambulatory Visit: Payer: Self-pay

## 2024-01-26 ENCOUNTER — Ambulatory Visit: Admitting: Physical Therapy

## 2024-01-26 DIAGNOSIS — M25611 Stiffness of right shoulder, not elsewhere classified: Secondary | ICD-10-CM

## 2024-01-26 DIAGNOSIS — M6281 Muscle weakness (generalized): Secondary | ICD-10-CM | POA: Diagnosis not present

## 2024-01-26 DIAGNOSIS — R29898 Other symptoms and signs involving the musculoskeletal system: Secondary | ICD-10-CM

## 2024-01-26 DIAGNOSIS — M25612 Stiffness of left shoulder, not elsewhere classified: Secondary | ICD-10-CM

## 2024-01-26 DIAGNOSIS — M436 Torticollis: Secondary | ICD-10-CM

## 2024-01-26 DIAGNOSIS — M542 Cervicalgia: Secondary | ICD-10-CM

## 2024-01-26 DIAGNOSIS — R262 Difficulty in walking, not elsewhere classified: Secondary | ICD-10-CM

## 2024-01-26 NOTE — Therapy (Signed)
 OUTPATIENT PHYSICAL treatment/ Re-certification    Patient Name: Kathryn Lucas MRN: [id number] DOB:1961/03/04, 63 y.o., female Today's Date: 01/26/2024   PCP: Vernon Velna SAUNDERS, MD  REFERRING PROVIDER: Odean Potts, MD   END OF SESSION:  PT End of Session - 01/26/24 1622     Visit Number 9    Number of Visits 16    Date for Recertification  02/23/24    PT Start Time 1620    PT Stop Time 1700    PT Time Calculation (min) 40 min    Equipment Utilized During Treatment Gait belt    Activity Tolerance Patient tolerated treatment well    Behavior During Therapy [facility] for tasks assessed/performed          Past Medical History:  Diagnosis Date   Breast cancer (HCC)    left breast cancer   Cancer (HCC) 09/2017   left breast cancer   Family history of breast cancer    Hypothyroidism    Personal history of radiation therapy 2019   Thyroid  disease    Past Surgical History:  Procedure Laterality Date   BREAST BIOPSY Left 2018   BREAST BIOPSY Left 2019   BREAST RECONSTRUCTION WITH PLACEMENT OF TISSUE EXPANDER AND ALLODERM Left 10/26/2017   Procedure: LEFT BREAST RECONSTRUCTION WITH PLACEMENT OF TISSUE EXPANDER AND ALLODERM;  Surgeon: Arelia Filippo, MD;  Location: [facility];  Service: Plastics;  Laterality: Left;   IR IMAGING GUIDED PORT INSERTION  04/27/2023   MASTECTOMY Left 2019   MASTECTOMY WITH RADIOACTIVE SEED GUIDED EXCISION AND AXILLARY SENTINEL LYMPH NODE BIOPSY Left 10/26/2017   Procedure: LEFT MASTECTOMY WITH SEED TARGETED  LEFT AXILLARY LYMPH NODE EXCISION AND LEFT SENTINEL LYMPH NODE BIOPSY;  Surgeon: Aron Shoulders, MD;  Location: [facility];  Service: General;  Laterality: Left;   TONSILLECTOMY     WISDOM TOOTH EXTRACTION     Patient Active Problem List   Diagnosis Date Noted   Focal seizure (HCC) 09/02/2023   Metastatic 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) 10/14/2022   Elevated liver enzymes 10/14/2022   Unexplained weight loss  10/14/2022   Neoplasm related pain 10/14/2022   Metastatic malignant neoplasm (HCC) 12/19/2021   Family history of breast cancer    History of therapeutic radiation 04/27/2018   Breast cancer, left breast (HCC) 10/26/2017   Malignant neoplasm of lower-outer quadrant of left breast of female, estrogen receptor positive (HCC) 07/15/2016   Plantar fasciitis 07/30/2015   Metatarsalgia 10/18/2014    ONSET DATE: September 2023   REFERRING DIAG:  Diagnosis  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   Pt states that yesterday was a rough day. Car battery died, so she had to jump start the car. Was able to attend chair yoga this AM. States that her shoulders and her hips are still her greatest limiting factors. Would like to transition focus of care to   From Eval:   Pt arrives at PT after prolonged hiatus from PT due to progress reports at that time. Reports that she  had restarted CA treatment via radiation to address metastases of skull and Spine. Completed radiation therapy  in September. Scans show improvement in cerebral metastases .   Pt reports that she feels like her legs and arms are weaker now after completing recent Radiation treatment. Wants to be able to stand in one place for increased time.   Pt accompanied by: none.   PERTINENT HISTORY:   MISCHELE DETTER is a 63 year old female with metastatic  cancer who presents for follow-up on her recent CT scans and lab results.   Her recent CT scans show stable metastatic lesions in the liver, bones, lymph nodes, and soft tissue thickening in the back, with resolution of skull lesions.   Pt had exacerbation of s/s in June of 2024 with hospitalization and subsequent change in oncology treatment limiting ability to attend PT. Pt reports recent decline again in function following radiation in summer 2025.   PAIN:  Are you having pain? No  PRECAUTIONS: Cervical and Other: cervical for comfort   RED FLAGS: None   WEIGHT BEARING RESTRICTIONS: No  FALLS: Has patient fallen in last 6 months? Yes. Number of falls 1  LIVING ENVIRONMENT: Lives with: lives alone Lives in: House/apartment Stairs: yes, but uses ramp  Has following equipment at home: Walker - 4 wheeled  PLOF: Needs assistance with ADLs and Needs assistance with homemaking  PATIENT GOALS: improve balance, strength and endurance. Stand at kitchen longer and   OBJECTIVE:   DIAGNOSTIC FINDINGS:  11/17/2023  CT head IMPRESSION: 1. No acute intracranial abnormality. 2. 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 3. Stable heterogeneous lesion involving the right frontal calvarium and additional scattered lytic lesions throughout the visualized calvarium. 4. Decreased soft tissue along the lateral aspect of the right globe, possibly representing mild prominence of the lacrimal gland. The orbits are otherwise unremarkable.  CT chest/abdomin  IMPRESSION: 1. Bilobar hepatic metastasis are similar to recent PET-CT but progressed from CT May 11, 2023. 2. Extensive osseous metastatic disease involving the axial and proximal appendicular skeleton with a few areas showing increased sclerosis which may reflect treatment response but is nonspecific. Suggest attention on follow-up  imaging. 3. Stable right axillary and right external iliac lymph nodes. 4. Subcutaneous/cutaneous nodular soft tissue thickening in the posterior upper back which was hypermetabolic on prior PET-CT is similar to that examination, compatible with disease involvement. 5. Gas fluid levels in a patulous esophagus, suggestive of gastroesophageal reflux. 6. Colonic stool burden compatible with constipation.   7/2 PET  IMPRESSION: 1. Overall disease progression as evidenced by increase in the hypermetabolic right axillary lymph nodes, new hypermetabolic mediastinal adenopathy and new hepatic metastases. 2. New left cervical level II lymph node, worrisome for metastatic disease. 3. Minimal improvement in right external iliac adenopathy. 4. Overall minimal improvement in osseous metastatic disease.   6/6:  Head CT  IMPRESSION: 1. No acute intracranial abnormality. 2. Extensive, predominantly stable areas of osseous metastasis throughout the  calvarium. 3. New area of enhancing soft tissue attenuation along the lateral aspect of the right globe, as described above. MRI correlation is recommended. 4. Multiple areas of soft tissue attenuation throughout the scalp, mildly increased in size and number when compared to the prior study. Mild interval progression of metastatic disease cannot be excluded.    COGNITION: Overall cognitive status: Within functional limits for tasks assessed   SENSATION: WFL  COORDINATION: Limited due to ROM deficits. WFL    MUSCLE TONE: WFL    POSTURE: rounded shoulders, forward head, and decreased lumbar lordosis  LOWER EXTREMITY MMT:   MMT  Right Eval Left Eval  Hip flexion 4 4  Hip extension    Hip abduction 4 4  Hip adduction 4+ 4+  Hip internal rotation    Hip external rotation    Knee flexion 4+ 4+  Knee extension 4+ 4+  Ankle dorsiflexion 5 5  Ankle plantarflexion 4 4  Ankle inversion    Ankle eversion     (Blank rows = not  tested) MMT  Right Eval Left Eval  Shoulder  flexion 4- 4  shoulder extension 4+ 4+  Shoulder abduction 4 4  Shoulder adduction 4+ 4+  Shoulder  internal rotation 4- 4  Shoulder external rotation 4+ 4+  Elbow flexion 4 4+  Elbow extension 4+ 4+                    LOWER EXTREMITY ROM:     Limited shoulder flexion and abduction to ~ 85 DEG  on the R shoulder   BED MOBILITY:  Sit to supine SBA Supine to sit SBA  TRANSFERS: Assistive device utilized: None  Sit to stand: Modified independence Stand to sit: Modified independence Chair to chair: Modified independence Floor: Modified independence   CURB:  Level of Assistance: CGA Assistive device utilized: Youth Worker Comments: step to pattern  STAIRS: Level of Assistance: CGA Stair Negotiation Technique: Step to Pattern with Bilateral Rails Number of Stairs: 4  Height of Stairs: 6  Comments: step to with BUE support   GAIT: Gait pattern: step through pattern genu recurvatum on the RLE.   Distance walked: 172ft Assistive device utilized: None Level of assistance: SBA Comments: no circumduction, greatly improved since last bout of PT treatment.   FUNCTIONAL TESTS:  5 times sit to stand: 16.57 sec with UE support   Timed up and go (TUG): 9.4 sec   6 minute walk test: 1090 10 meter walk test:  9.55 sec (1.6m/s)  Berg Balance Scale: 47/56 Functional gait assessment: 19/30   PATIENT SURVEYS:  ABC scale: The Activities-Specific Balance Confidence (ABC) Scale 0% 10 20 30  40 50 60 70 80 90 100% No confidence<->completely confident  "How confident are you that you will not lose your balance or become unsteady when you . . .   Date tested 11/24/23   Walk around the house 100%  2. Walk up or down stairs 100%  3. Bend over and pick up a slipper from in front of a closet floor 100%  4. Reach for a small can off a shelf at eye level 100%  5. Stand on tip toes and reach for something above your head 100%  6. Stand on  a chair and reach for something 60%  7. Sweep the floor 100%  8. Walk outside the house to a car parked in the driveway 100%  9. Get into or out of a car 100%  10. Walk across a  parking lot to the mall 100%  11. Walk up or down a ramp 100%  12. Walk in a crowded mall where people rapidly walk past you 100%  13. Are bumped into by people as you walk through the mall 80%  14. Step onto or off of an escalator while you are holding onto the railing 100%  15. Step onto or off an escalator while holding onto parcels such that you cannot hold onto the railing 80%  16. Walk outside on icy sidewalks 70%  Total: #/16  93%     [facility] PT Assessment - 12/08/23 0001       Assessment   Hand Dominance Right      Berg Balance Test   Sit to Stand Able to stand  independently using hands    Standing Unsupported Able to stand safely 2 minutes    Sitting with Back Unsupported but Feet Supported on Floor or Stool Able to sit safely and securely 2 minutes    Stand to Sit Controls descent by using hands    Transfers Able to transfer safely, definite need of hands    Standing Unsupported with Eyes Closed Able to stand 10 seconds safely    Standing Unsupported with Feet Together Able to place feet together independently and stand 1 minute safely    From Standing, Reach Forward with Outstretched Arm Can reach forward >12 cm safely (5)    From Standing Position, Pick up Object from Floor Able to pick up shoe safely and easily    From Standing Position, Turn to Look Behind Over each Shoulder Looks behind one side only/other side shows less weight shift    Turn 360 Degrees Able to turn 360 degrees safely one side only in 4 seconds or less    Standing Unsupported, Alternately Place Feet on Step/Stool Able to stand independently and safely and complete 8 steps in 20 seconds    Standing Unsupported, One Foot in Front Able to plae foot ahead of the other independently and hold 30 seconds    Standing on One Leg Able to  lift leg independently and hold equal to or more than 3 seconds      Functional Gait  Assessment   Gait Level Surface Walks 20 ft in less than 5.5 sec, no assistive devices, good speed, no evidence for imbalance, normal gait pattern, deviates no more than 6 in outside of the 12 in walkway width.    Change in Gait Speed Able to smoothly change walking speed without loss of balance or gait deviation. Deviate no more than 6 in outside of the 12 in walkway width.    Gait with Horizontal Head Turns Performs head turns smoothly with slight change in gait velocity (eg, minor disruption to smooth gait path), deviates 6-10 in outside 12 in walkway width, or uses an assistive device.    Gait with Vertical Head Turns Performs head turns with no change in gait. Deviates no more than 6 in outside 12 in walkway width.    Gait and Pivot Turn Pivot turns safely within 3 sec and stops quickly with no loss of balance.    Step Over Obstacle Is able to step over one shoe box (4.5 in total height) but must slow down and adjust steps to clear box safely. May require verbal cueing.    Gait with Narrow Base of Support Ambulates less than 4 steps heel to toe or cannot perform without assistance.    Gait with Eyes Closed Walks  20 ft, slow speed, abnormal gait pattern, evidence for imbalance, deviates 10-15 in outside 12 in walkway width. Requires more than 9 sec to ambulate 20 ft.    Ambulating Backwards Walks 20 ft, slow speed, abnormal gait pattern, evidence for imbalance, deviates 10-15 in outside 12 in walkway width.    Steps Alternating feet, must use rail.         UPPER EXTREMITY ROM:  Active ROM Right eval Left eval  Shoulder flexion 80 100  Shoulder extension    Shoulder abduction 60 80  Shoulder adduction    Shoulder extension    Shoulder internal rotation  [facility]  Shoulder external rotation -10deg 20deg   Elbow flexion    Elbow extension    Wrist flexion    Wrist extension    Wrist ulnar deviation     Wrist radial deviation    Wrist pronation    Wrist supination     (Blank rows = not tested)   TODAY'S TREATMENT:                                                                                                                              DATE: 01/26/2024  Nustep reciprocal movement training x 10 min. Double hill program level 2-6. Able to intermittently use BUE for first 5 min and last 2 min, on this day with 2 min rest break of RUE due to soreness in shoulder.   PT treatment focused on BUE ROM deficits. ROM assessment in Supine See above. Education on the benefits for independence with ADLs with increased ER and shoulder flexion/abduction.   Supine shoulder flexion in available range with PVC pipe x 12. Mild crepitus noted but no increase in pain per pt.  AROM IR/ER x 15 BUE Standing hand slide across table within available flexion x 5 abduction x 5   Standing finger ladder x 3 bil with 2 sec hold at pain free end range, 26 on the LUE, 21 on the RUE.     Forward step ups 10 inch step x 8 bil with UE support on the rail. Lateral step up/over 6inch brown step x 12 bil.   Activity Description: pod on white board. Set to end range shoulder flexion for BUE. Step up/over 6inch step to get to pods on L/R/middle of white board Activity Setting:  random Number of Pods:  3 Cycles/Sets:  2 Duration (Time or Hit Count):  21 Patient Stats  Time: 2:21 and 1:46.  Cues for lateral step up/over 6inch step and use of Proper UE to improve AROM. Mild trunkal extension on the R side to compensate for ROM deficits in flexion  Standing shoulder ER and scapula retraction in pain free range.   Gentle AAROM for shoulder abduction in pain free ROM. Noted hard end feel at ~ 60 deg abduction.   PATIENT EDUCATION: Education details: Pt educated throughout session about proper posture and technique with exercises. Improved exercise technique, movement  at target joints, use of target muscles after min to  mod verbal, visual, tactile cues.  Person educated: Patient Education method: Explanation Education comprehension: verbalized understanding  HOME EXERCISE PROGRAM:   Access Code: [email or web address] URL: [email or web address] Date: 01/27/2024 Prepared by: Massie Dollar  Exercises - Seated Shoulder Flexion Towel Slide at Table Top  - 1 x daily - 7 x weekly - 3 sets - 10 reps - Seated Shoulder Abduction Towel Slide at Table Top  - 1 x daily - 7 x weekly - 3 sets - 10 reps - Shoulder External Rotation and Scapular Retraction  - 1 x daily - 7 x weekly - 3 sets - 10 reps  Access Code: [email or web address] URL: [email or web address] Date: 12/08/2023 Prepared by: Massie Dollar  Exercises - Sit to Stand with Armchair  - 1 x daily - 7 x weekly - 3 sets - 10 reps - Seated Long Arc Quad  - 1 x daily - 7 x weekly - 3 sets - 10 reps - Standing March with Counter Support  - 1 x daily - 7 x weekly - 3 sets - 10 reps - Seated March  - 1 x daily - 7 x weekly - 3 sets - 10 reps - Side Stepping with Counter Support  - 1 x daily - 7 x weekly - 3 sets - 10 reps - Seated Heel Raise  - 1 x daily - 7 x weekly - 3 sets - 10 reps - Seated Toe Raise  - 1 x daily - 7 x weekly - 3 sets - 10 repsit.   GOALS: Goals reviewed with patient? Yes  SHORT TERM GOALS: Target date: 12/22/2023   Patient will be independent in home exercise program to improve strength/mobility for better functional independence with ADLs. Baseline: to provide at session 1 or 2  Goal status: INITIAL   LONG TERM GOALS: Target date: 01/19/2024    Patient will increase LEFS score to equal to or greater than 8 points   to demonstrate statistically significant improvement in mobility and quality of life.  Baseline:  to be completed   Goal status: INITIAL  2.  Patient (> 7 years old) will complete five times sit to stand test in < 15 seconds without use of BUE indicating an increased LE strength and improved  balance. Baseline: 16.57 sec with UE supported on arm rest  Goal status: INITIAL  3.  Patient will increase Berg Balance score by > 6 points to demonstrate decreased fall risk during functional activities Baseline: 47/56 Goal status: INITIAL  4.  Patient will increase 6 min walk test to >1258ft as to improve gait speed for better community ambulation and to reduce fall risk. Baseline:109ft Goal status: INITIAL    5.  Patient will ascend and descent stairs without UE support to improve independence with access to home and community   Goal status: INITIAL  6.  Patient will increase FGA score to >22 as to demonstrate reduced fall risk and improved dynamic gait balance for better safety with community/home ambulation. Baseline:19 Goal status: INITIAL  7.  Patient will increase R shoulder ROM in flexion/abduction and ER to allow pt reach to top of head for hygiene and grooming task Baseline:able to reach hairline or just superior to the R on the R side.  Goal status: INITIAL/new      ASSESSMENT:  CLINICAL IMPRESSION: Pt put forth excellent effort for PT treatment to  address functional deficits listed above secondary to continued CA treatment.  PT treatment  focused on generalized strengthening and shoulder rom AAROM and AROM for reaching tasks, as pt reports that this is still her greatest limiting factor functionally.  ROM assessment performed for BUE noted significant shoulder limitations in the RUE> LUE. Reached out to MD to confirmed pt's risk for fracture is still elevated, so PT treatment will address shoulder strength and ROM deficits conservatively. Pt will benefit from skilled PT treatment  to address balance, strength and ROM deficits, improve safety and QoL while continuing CA treatment.    OBJECTIVE IMPAIRMENTS: Abnormal gait, cardiopulmonary status limiting activity, decreased activity tolerance, decreased balance, decreased endurance, decreased knowledge of condition,  decreased mobility, difficulty walking, decreased ROM, decreased strength, hypomobility, increased fascial restrictions, impaired perceived functional ability, impaired flexibility, impaired UE functional use, improper body mechanics, postural dysfunction, and pain.   ACTIVITY LIMITATIONS: carrying, lifting, bending, standing, squatting, sleeping, stairs, transfers, bathing, toileting, dressing, reach over head, and locomotion level  PARTICIPATION LIMITATIONS: cleaning, laundry, driving, shopping, community activity, and yard work  PERSONAL FACTORS: Age, Behavior pattern, Fitness, Past/current experiences, and 1-2 comorbidities: CA with profound metastases  are also affecting patient's functional outcome.   REHAB POTENTIAL: Good  CLINICAL DECISION MAKING: Evolving/moderate complexity  EVALUATION COMPLEXITY: Moderate  PLAN:  PT FREQUENCY: 1-2x/week  PT DURATION: 8 weeks  PLANNED INTERVENTIONS: Therapeutic exercises, Therapeutic activity, Neuromuscular re-education, Balance training, Gait training, Patient/Family education, Self Care, Joint mobilization, Stair training, DME instructions, Cryotherapy, Moist heat, and Manual therapy  PLAN FOR NEXT SESSION:   Generalized strengthening for BUE/BLE. UE/cervical ROM as appropriate.     Massie FORBES Dollar, PT 01/26/2024, 4:23 PM

## 2024-01-27 ENCOUNTER — Encounter: Admitting: Physician Assistant

## 2024-02-02 ENCOUNTER — Ambulatory Visit: Admitting: Physical Therapy

## 2024-02-02 DIAGNOSIS — M25612 Stiffness of left shoulder, not elsewhere classified: Secondary | ICD-10-CM

## 2024-02-02 DIAGNOSIS — M436 Torticollis: Secondary | ICD-10-CM

## 2024-02-02 DIAGNOSIS — R262 Difficulty in walking, not elsewhere classified: Secondary | ICD-10-CM

## 2024-02-02 DIAGNOSIS — M6281 Muscle weakness (generalized): Secondary | ICD-10-CM | POA: Diagnosis not present

## 2024-02-02 DIAGNOSIS — M542 Cervicalgia: Secondary | ICD-10-CM

## 2024-02-02 DIAGNOSIS — R29898 Other symptoms and signs involving the musculoskeletal system: Secondary | ICD-10-CM

## 2024-02-02 DIAGNOSIS — M25611 Stiffness of right shoulder, not elsewhere classified: Secondary | ICD-10-CM

## 2024-02-02 NOTE — Therapy (Signed)
 OUTPATIENT PHYSICAL treatment/ PHYSICAL THERAPY PROGRESS NOTE   Dates of reporting period  11/24/2023   to   02/02/2024     Patient Name: Kathryn Lucas MRN: [id number] DOB:1960/09/25, 63 y.o., female Today's Date: 02/02/2024   PCP: Vernon Velna SAUNDERS, MD  REFERRING PROVIDER: Odean Potts, MD   END OF SESSION:  PT End of Session - 02/02/24 1537     Visit Number 10    Number of Visits 18    Date for Recertification  03/12/24    PT Start Time 1533    PT Stop Time 1611    PT Time Calculation (min) 38 min    Equipment Utilized During Treatment Gait belt    Activity Tolerance Patient tolerated treatment well    Behavior During Therapy [facility] for tasks assessed/performed          Past Medical History:  Diagnosis Date   Breast cancer (HCC)    left breast cancer   Cancer (HCC) 09/2017   left breast cancer   Family history of breast cancer    Hypothyroidism    Personal history of radiation therapy 2019   Thyroid  disease    Past Surgical History:  Procedure Laterality Date   BREAST BIOPSY Left 2018   BREAST BIOPSY Left 2019   BREAST RECONSTRUCTION WITH PLACEMENT OF TISSUE EXPANDER AND ALLODERM Left 10/26/2017   Procedure: LEFT BREAST RECONSTRUCTION WITH PLACEMENT OF TISSUE EXPANDER AND ALLODERM;  Surgeon: Arelia Filippo, MD;  Location: [facility];  Service: Plastics;  Laterality: Left;   IR IMAGING GUIDED PORT INSERTION  04/27/2023   MASTECTOMY Left 2019   MASTECTOMY WITH RADIOACTIVE SEED GUIDED EXCISION AND AXILLARY SENTINEL LYMPH NODE BIOPSY Left 10/26/2017   Procedure: LEFT MASTECTOMY WITH SEED TARGETED  LEFT AXILLARY LYMPH NODE EXCISION AND LEFT SENTINEL LYMPH NODE BIOPSY;  Surgeon: Aron Shoulders, MD;  Location: [facility];  Service: General;  Laterality: Left;   TONSILLECTOMY     WISDOM TOOTH EXTRACTION     Patient Active Problem List   Diagnosis Date Noted   Focal seizure (HCC) 09/02/2023   Metastatic 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)  10/14/2022   Elevated liver enzymes 10/14/2022   Unexplained weight loss 10/14/2022   Neoplasm related pain 10/14/2022   Metastatic malignant neoplasm (HCC) 12/19/2021   Family history of breast cancer    History of therapeutic radiation 04/27/2018   Breast cancer, left breast (HCC) 10/26/2017   Malignant neoplasm of lower-outer quadrant of left breast of female, estrogen receptor positive (HCC) 07/15/2016   Plantar fasciitis 07/30/2015   Metatarsalgia 10/18/2014    ONSET DATE: September 2023   REFERRING DIAG:  Diagnosis  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   Pt states that she was able to go to chair yoga this AM. Reports that she is usually sore by the next day, but not feeling sore yet from. Is hoping to continue to attend chair yoga and well zone - 2-3 times a week moving forward.   From Eval:   Pt arrives at PT after prolonged hiatus from PT due to progress reports at that time. Reports that she  had restarted CA treatment via radiation to address metastases of skull and Spine. Completed radiation therapy  in September. Scans show improvement in cerebral metastases .   Pt reports that she feels like her legs and arms are weaker now after completing recent Radiation treatment. Wants to be able to stand in one place for increased time.   Pt accompanied by: none.   PERTINENT HISTORY:   Kathryn Lucas is a 63 year old female with metastatic cancer who presents for follow-up on her recent CT scans and lab results.   Her recent CT scans show stable metastatic lesions in the liver, bones, lymph nodes, and soft tissue thickening in the back, with resolution of skull lesions.   Pt had exacerbation of s/s in June of 2024 with hospitalization and subsequent change in oncology treatment limiting ability to attend PT. Pt reports recent decline again in function following radiation in summer 2025.   PAIN:  Are you having pain? No  PRECAUTIONS: Cervical and Other: cervical for comfort   RED FLAGS: None   WEIGHT BEARING RESTRICTIONS: No  FALLS: Has patient fallen in last 6 months? Yes. Number of falls 1  LIVING ENVIRONMENT: Lives with: lives alone Lives in: House/apartment Stairs: yes, but uses ramp  Has following equipment at home: Walker - 4 wheeled  PLOF: Needs assistance with ADLs and Needs assistance with homemaking  PATIENT GOALS: improve balance, strength and endurance. Stand at kitchen longer and   OBJECTIVE:   DIAGNOSTIC FINDINGS:  11/17/2023  CT head IMPRESSION: 1. No acute intracranial abnormality. 2. 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 3. Stable heterogeneous lesion involving the right frontal calvarium and additional scattered lytic lesions throughout the visualized calvarium. 4. Decreased soft tissue along the lateral aspect of the right globe, possibly representing mild prominence of the lacrimal gland. The orbits are otherwise unremarkable.  CT chest/abdomin  IMPRESSION: 1. Bilobar hepatic metastasis are similar to recent PET-CT but progressed from CT May 11, 2023. 2. Extensive osseous metastatic disease involving the axial and proximal appendicular skeleton with a few areas showing increased sclerosis which may reflect treatment response but is  nonspecific. Suggest attention on follow-up imaging. 3. Stable right axillary and right external iliac lymph nodes. 4. Subcutaneous/cutaneous nodular soft tissue thickening in the posterior upper back which was hypermetabolic on prior PET-CT is similar to that examination, compatible with disease involvement. 5. Gas fluid levels in a patulous esophagus, suggestive of gastroesophageal reflux. 6. Colonic stool burden compatible with constipation.   7/2 PET  IMPRESSION: 1. Overall disease progression as evidenced by increase in the hypermetabolic right axillary lymph nodes, new hypermetabolic mediastinal adenopathy and new hepatic metastases. 2. New left cervical level II lymph node, worrisome for metastatic disease. 3. Minimal improvement in right external iliac adenopathy. 4. Overall minimal improvement in osseous metastatic disease.   6/6:  Head CT  IMPRESSION: 1. No acute intracranial abnormality. 2. Extensive, predominantly stable areas of osseous metastasis throughout the calvarium.  3. New area of enhancing soft tissue attenuation along the lateral aspect of the right globe, as described above. MRI correlation is recommended. 4. Multiple areas of soft tissue attenuation throughout the scalp, mildly increased in size and number when compared to the prior study. Mild interval progression of metastatic disease cannot be excluded.    COGNITION: Overall cognitive status: Within functional limits for tasks assessed   SENSATION: WFL  COORDINATION: Limited due to ROM deficits. WFL    MUSCLE TONE: WFL    POSTURE: rounded shoulders, forward head, and decreased lumbar lordosis  LOWER EXTREMITY MMT:   MMT  Right Eval Left Eval  Hip flexion 4 4  Hip extension    Hip abduction 4 4  Hip adduction 4+ 4+  Hip internal rotation    Hip external rotation    Knee flexion 4+ 4+  Knee extension 4+ 4+  Ankle dorsiflexion 5 5  Ankle plantarflexion 4 4  Ankle inversion     Ankle eversion     (Blank rows = not tested) MMT  Right Eval Left Eval  Shoulder  flexion 4- 4  shoulder extension 4+ 4+  Shoulder abduction 4 4  Shoulder adduction 4+ 4+  Shoulder  internal rotation 4- 4  Shoulder external rotation 4+ 4+  Elbow flexion 4 4+  Elbow extension 4+ 4+                    LOWER EXTREMITY ROM:     Limited shoulder flexion and abduction to ~ 85 DEG  on the R shoulder   BED MOBILITY:  Sit to supine SBA Supine to sit SBA  TRANSFERS: Assistive device utilized: None  Sit to stand: Modified independence Stand to sit: Modified independence Chair to chair: Modified independence Floor: Modified independence   CURB:  Level of Assistance: CGA Assistive device utilized: Youth Worker Comments: step to pattern  STAIRS: Level of Assistance: CGA Stair Negotiation Technique: Step to Pattern with Bilateral Rails Number of Stairs: 4  Height of Stairs: 6  Comments: step to with BUE support   GAIT: Gait pattern: step through pattern genu recurvatum on the RLE.   Distance walked: 165ft Assistive device utilized: None Level of assistance: SBA Comments: no circumduction, greatly improved since last bout of PT treatment.   FUNCTIONAL TESTS:  5 times sit to stand: 16.57 sec with UE support   Timed up and go (TUG): 9.4 sec   6 minute walk test: 1090 10 meter walk test:  9.55 sec (1.91m/s)  Berg Balance Scale: 47/56 Functional gait assessment: 19/30   PATIENT SURVEYS:  ABC scale: The Activities-Specific Balance Confidence (ABC) Scale 0% 10 20 30  40 50 60 70 80 90 100% No confidence<->completely confident  "How confident are you that you will not lose your balance or become unsteady when you . . .   Date tested 11/24/23   Walk around the house 100%  2. Walk up or down stairs 100%  3. Bend over and pick up a slipper from in front of a closet floor 100%  4. Reach for a small can off a shelf at eye level 100%  5. Stand on tip toes and reach for  something above your head 100%  6. Stand on a chair and reach for something 60%  7. Sweep the floor 100%  8. Walk outside the house to a car parked in the driveway 100%  9. Get into or out of a car 100%  10. Walk across a parking  lot to the mall 100%  11. Walk up or down a ramp 100%  12. Walk in a crowded mall where people rapidly walk past you 100%  13. Are bumped into by people as you walk through the mall 80%  14. Step onto or off of an escalator while you are holding onto the railing 100%  15. Step onto or off an escalator while holding onto parcels such that you cannot hold onto the railing 80%  16. Walk outside on icy sidewalks 70%  Total: #/16  93%     [facility] PT Assessment - 12/08/23 0001       Assessment   Hand Dominance Right      Berg Balance Test   Sit to Stand Able to stand  independently using hands    Standing Unsupported Able to stand safely 2 minutes    Sitting with Back Unsupported but Feet Supported on Floor or Stool Able to sit safely and securely 2 minutes    Stand to Sit Controls descent by using hands    Transfers Able to transfer safely, definite need of hands    Standing Unsupported with Eyes Closed Able to stand 10 seconds safely    Standing Unsupported with Feet Together Able to place feet together independently and stand 1 minute safely    From Standing, Reach Forward with Outstretched Arm Can reach forward >12 cm safely (5)    From Standing Position, Pick up Object from Floor Able to pick up shoe safely and easily    From Standing Position, Turn to Look Behind Over each Shoulder Looks behind one side only/other side shows less weight shift    Turn 360 Degrees Able to turn 360 degrees safely one side only in 4 seconds or less    Standing Unsupported, Alternately Place Feet on Step/Stool Able to stand independently and safely and complete 8 steps in 20 seconds    Standing Unsupported, One Foot in Front Able to plae foot ahead of the other independently and  hold 30 seconds    Standing on One Leg Able to lift leg independently and hold equal to or more than 3 seconds      Functional Gait  Assessment   Gait Level Surface Walks 20 ft in less than 5.5 sec, no assistive devices, good speed, no evidence for imbalance, normal gait pattern, deviates no more than 6 in outside of the 12 in walkway width.    Change in Gait Speed Able to smoothly change walking speed without loss of balance or gait deviation. Deviate no more than 6 in outside of the 12 in walkway width.    Gait with Horizontal Head Turns Performs head turns smoothly with slight change in gait velocity (eg, minor disruption to smooth gait path), deviates 6-10 in outside 12 in walkway width, or uses an assistive device.    Gait with Vertical Head Turns Performs head turns with no change in gait. Deviates no more than 6 in outside 12 in walkway width.    Gait and Pivot Turn Pivot turns safely within 3 sec and stops quickly with no loss of balance.    Step Over Obstacle Is able to step over one shoe box (4.5 in total height) but must slow down and adjust steps to clear box safely. May require verbal cueing.    Gait with Narrow Base of Support Ambulates less than 4 steps heel to toe or cannot perform without assistance.    Gait with Eyes Closed Walks 20  ft, slow speed, abnormal gait pattern, evidence for imbalance, deviates 10-15 in outside 12 in walkway width. Requires more than 9 sec to ambulate 20 ft.    Ambulating Backwards Walks 20 ft, slow speed, abnormal gait pattern, evidence for imbalance, deviates 10-15 in outside 12 in walkway width.    Steps Alternating feet, must use rail.         UPPER EXTREMITY ROM:  Active ROM Right eval Left eval  Shoulder flexion 80 100  Shoulder extension    Shoulder abduction 60 80  Shoulder adduction    Shoulder extension    Shoulder internal rotation  [facility]  Shoulder external rotation -10deg 20deg   Elbow flexion    Elbow extension    Wrist  flexion    Wrist extension    Wrist ulnar deviation    Wrist radial deviation    Wrist pronation    Wrist supination     (Blank rows = not tested)   TODAY'S TREATMENT:                                                                                                                              DATE: 02/02/2024  Nustep reciprocal movement training x then 2 min rest break + 4 min level 1 throughout to allow increased use of RUE throughout entire training to improve  PT treatment focused on LUE ROM deficits.   Supine PROM with inferior grade 2 mobilization at end range R shoulder flexion x 3 min  Supine PROM with inferior grade 2 mobilization at end range R shoulder ER  x 3 min able to tolerate near neutral.   AROM shoulder ER/IR with tactile cues for reduced compensatory movement.   STM to teres major/minor x 3 with total with TP release.  Suboccipital release x 2 min.  Cervical lateral flexion for UT stretch in pain free range and over pressure from PT to increase ROM. 20 sec hold bil Cervical rotation with overpressure from PTx 20 sec bil.   Standing finger ladder x 3 RUE with 2 sec hold at pain free end range, 19-20 on the RUE.    PATIENT EDUCATION: Education details: Pt educated throughout session about proper posture and technique with exercises. Improved exercise technique, movement at target joints, use of target muscles after min to mod verbal, visual, tactile cues.  Person educated: Patient Education method: Explanation Education comprehension: verbalized understanding  HOME EXERCISE PROGRAM:   Access Code: [email or web address] URL: [email or web address] Date: 01/27/2024 Prepared by: Massie Dollar  Exercises - Seated Shoulder Flexion Towel Slide at Table Top  - 1 x daily - 7 x weekly - 3 sets - 10 reps - Seated Shoulder Abduction Towel Slide at Table Top  - 1 x daily - 7 x weekly - 3 sets - 10 reps - Shoulder External Rotation and Scapular Retraction  - 1 x  daily - 7 x weekly - 3 sets - 10 reps  Access Code:  [email or web address] URL: [email or web address] Date: 12/08/2023 Prepared by: Massie Dollar  Exercises - Sit to Stand with Armchair  - 1 x daily - 7 x weekly - 3 sets - 10 reps - Seated Long Arc Quad  - 1 x daily - 7 x weekly - 3 sets - 10 reps - Standing March with Counter Support  - 1 x daily - 7 x weekly - 3 sets - 10 reps - Seated March  - 1 x daily - 7 x weekly - 3 sets - 10 reps - Side Stepping with Counter Support  - 1 x daily - 7 x weekly - 3 sets - 10 reps - Seated Heel Raise  - 1 x daily - 7 x weekly - 3 sets - 10 reps - Seated Toe Raise  - 1 x daily - 7 x weekly - 3 sets - 10 repsit.   GOALS: Goals reviewed with patient? Yes  SHORT TERM GOALS: Target date: 12/22/2023   Patient will be independent in home exercise program to improve strength/mobility for better functional independence with ADLs. Baseline: to provide at session 1 or 2  Goal status: INITIAL   LONG TERM GOALS: Target date: 01/19/2024    Patient will increase LEFS score to equal to or greater than 8 points   to demonstrate statistically significant improvement in mobility and quality of life.  Baseline:  to be completed   Goal status: INITIAL  2.  Patient (> 62 years old) will complete five times sit to stand test in < 15 seconds without use of BUE indicating an increased LE strength and improved balance. Baseline: 16.57 sec with UE supported on arm rest  Goal status: INITIAL  3.  Patient will increase Berg Balance score by > 6 points to demonstrate decreased fall risk during functional activities Baseline: 47/56 Goal status: INITIAL  4.  Patient will increase 6 min walk test to >1221ft as to improve gait speed for better community ambulation and to reduce fall risk. Baseline:1021ft Goal status: INITIAL    5.  Patient will ascend and descent stairs without UE support to improve independence with access to home and community   Goal status:  INITIAL  6.  Patient will increase FGA score to >22 as to demonstrate reduced fall risk and improved dynamic gait balance for better safety with community/home ambulation. Baseline:19 Goal status: INITIAL  7.  Patient will increase R shoulder ROM in flexion/abduction and ER to allow pt reach to top of head for hygiene and grooming task Baseline:able to reach hairline or just superior to the R on the R side.  Goal status: INITIAL/new      ASSESSMENT:  CLINICAL IMPRESSION: Pt put forth excellent effort for PT treatment to  address functional deficits listed above secondary to continued CA treatment.  PT treatment focused on generalized strengthening and shoulder rom AAROM and AROM for reaching tasks, as pt reports that this is still her greatest limiting factor functionally.  Gentle manual therapy fr shoulder ROM into flexion and ER on this day with grade 1-2 mobilizations. Also performed gentle SPT to suboccitials to address cervical stiffness with decreased tension upon completion. Pt will benefit from skilled PT treatment  to address balance, strength and ROM deficits, improve safety and QoL while continuing CA treatment.    OBJECTIVE IMPAIRMENTS: Abnormal gait, cardiopulmonary status limiting activity, decreased activity tolerance, decreased balance, decreased endurance, decreased knowledge of condition, decreased mobility, difficulty walking, decreased ROM, decreased strength, hypomobility, increased fascial restrictions, impaired perceived functional ability,  impaired flexibility, impaired UE functional use, improper body mechanics, postural dysfunction, and pain.   ACTIVITY LIMITATIONS: carrying, lifting, bending, standing, squatting, sleeping, stairs, transfers, bathing, toileting, dressing, reach over head, and locomotion level  PARTICIPATION LIMITATIONS: cleaning, laundry, driving, shopping, community activity, and yard work  PERSONAL FACTORS: Age, Behavior pattern, Fitness,  Past/current experiences, and 1-2 comorbidities: CA with profound metastases  are also affecting patient's functional outcome.   REHAB POTENTIAL: Good  CLINICAL DECISION MAKING: Evolving/moderate complexity  EVALUATION COMPLEXITY: Moderate  PLAN:  PT FREQUENCY: 1-2x/week  PT DURATION: 8 weeks  PLANNED INTERVENTIONS: Therapeutic exercises, Therapeutic activity, Neuromuscular re-education, Balance training, Gait training, Patient/Family education, Self Care, Joint mobilization, Stair training, DME instructions, Cryotherapy, Moist heat, and Manual therapy  PLAN FOR NEXT SESSION:   Generalized strengthening for BUE/ UE/cervical ROM as appropriate.     Massie FORBES Dollar, PT 02/02/2024, 3:40 PM

## 2024-02-04 ENCOUNTER — Encounter: Admitting: Physician Assistant

## 2024-02-09 ENCOUNTER — Ambulatory Visit: Admitting: Physical Therapy

## 2024-02-09 ENCOUNTER — Other Ambulatory Visit (HOSPITAL_COMMUNITY): Payer: Self-pay

## 2024-02-09 DIAGNOSIS — M25611 Stiffness of right shoulder, not elsewhere classified: Secondary | ICD-10-CM

## 2024-02-09 DIAGNOSIS — M436 Torticollis: Secondary | ICD-10-CM

## 2024-02-09 DIAGNOSIS — M6281 Muscle weakness (generalized): Secondary | ICD-10-CM

## 2024-02-09 DIAGNOSIS — R262 Difficulty in walking, not elsewhere classified: Secondary | ICD-10-CM

## 2024-02-09 DIAGNOSIS — R29898 Other symptoms and signs involving the musculoskeletal system: Secondary | ICD-10-CM

## 2024-02-09 DIAGNOSIS — M25612 Stiffness of left shoulder, not elsewhere classified: Secondary | ICD-10-CM

## 2024-02-09 DIAGNOSIS — M542 Cervicalgia: Secondary | ICD-10-CM

## 2024-02-09 NOTE — Therapy (Signed)
 OUTPATIENT PHYSICAL treatment   Patient Name: Kathryn Lucas MRN: [id number] DOB:Jun 09, 1960, 63 y.o., female Today's Date: 02/09/2024   PCP: Kathryn Velna SAUNDERS, MD  REFERRING PROVIDER: Odean Potts, MD   END OF SESSION:  PT End of Session - 02/09/24 1548     Visit Number 11    Number of Visits 18    Date for Recertification  03/12/24    PT Start Time 1535    PT Stop Time 1615    PT Time Calculation (min) 40 min    Equipment Utilized During Treatment Gait belt    Activity Tolerance Patient tolerated treatment well    Behavior During Therapy [facility] for tasks assessed/performed          Past Medical History:  Diagnosis Date   Breast cancer (HCC)    left breast cancer   Cancer (HCC) 09/2017   left breast cancer   Family history of breast cancer    Hypothyroidism    Personal history of radiation therapy 2019   Thyroid  disease    Past Surgical History:  Procedure Laterality Date   BREAST BIOPSY Left 2018   BREAST BIOPSY Left 2019   BREAST RECONSTRUCTION WITH PLACEMENT OF TISSUE EXPANDER AND ALLODERM Left 10/26/2017   Procedure: LEFT BREAST RECONSTRUCTION WITH PLACEMENT OF TISSUE EXPANDER AND ALLODERM;  Surgeon: Kathryn Filippo, MD;  Location: [facility];  Service: Plastics;  Laterality: Left;   IR IMAGING GUIDED PORT INSERTION  04/27/2023   MASTECTOMY Left 2019   MASTECTOMY WITH RADIOACTIVE SEED GUIDED EXCISION AND AXILLARY SENTINEL LYMPH NODE BIOPSY Left 10/26/2017   Procedure: LEFT MASTECTOMY WITH SEED TARGETED  LEFT AXILLARY LYMPH NODE EXCISION AND LEFT SENTINEL LYMPH NODE BIOPSY;  Surgeon: Kathryn Shoulders, MD;  Location: [facility];  Service: General;  Laterality: Left;   TONSILLECTOMY     WISDOM TOOTH EXTRACTION     Patient Active Problem List   Diagnosis Date Noted   Focal seizure (HCC) 09/02/2023   Metastatic 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) 10/14/2022   Elevated liver enzymes 10/14/2022   Unexplained weight loss 10/14/2022   Neoplasm  related pain 10/14/2022   Metastatic malignant neoplasm (HCC) 12/19/2021   Family history of breast cancer    History of therapeutic radiation 04/27/2018   Breast cancer, left breast (HCC) 10/26/2017   Malignant neoplasm of lower-outer quadrant of left breast of female, estrogen receptor positive (HCC) 07/15/2016   Plantar fasciitis 07/30/2015   Metatarsalgia 10/18/2014    ONSET DATE: September 2023   REFERRING DIAG:  Diagnosis  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   Pt states that she was able to go to chair yoga again this AM. Reports that she is doing well overall, and feel like her shoulder is even moving a little better.    From Eval:   Pt arrives at PT after prolonged hiatus from PT due to progress reports at that time. Reports that she  had restarted CA treatment via radiation to address metastases of skull and Spine. Completed radiation therapy  in September. Scans show improvement in cerebral metastases .   Pt reports that she feels like her legs and arms are weaker now after completing recent Radiation treatment. Wants to be able to stand in one place for increased time.   Pt accompanied by: none.   PERTINENT HISTORY:   Kathryn Lucas is a 63 year old female with metastatic cancer who presents for follow-up on her recent CT scans and lab results.   Her recent CT scans show stable  metastatic lesions in the liver, bones, lymph nodes, and soft tissue thickening in the back, with resolution of skull lesions.   Pt had exacerbation of s/s in June of 2024 with hospitalization and subsequent change in oncology treatment limiting ability to attend PT. Pt reports recent decline again in function following radiation in summer 2025.   PAIN:  Are you having pain? No  PRECAUTIONS: Cervical and Other: cervical for comfort   RED FLAGS: None   WEIGHT BEARING RESTRICTIONS: No  FALLS: Has patient fallen in last 6 months? Yes. Number of falls 1  LIVING ENVIRONMENT: Lives with: lives alone Lives in: House/apartment Stairs: yes, but uses ramp  Has following equipment at home: Walker - 4 wheeled  PLOF: Needs assistance with ADLs and Needs assistance with homemaking  PATIENT GOALS: improve balance, strength and endurance. Stand at kitchen longer and   OBJECTIVE:   DIAGNOSTIC FINDINGS:  11/17/2023  CT head IMPRESSION: 1. No acute intracranial abnormality. 2. 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 3. Stable heterogeneous lesion involving the right frontal calvarium and additional scattered lytic lesions throughout the visualized calvarium. 4. Decreased soft tissue along the lateral aspect of the right globe, possibly representing mild prominence of the lacrimal gland. The orbits are otherwise unremarkable.  CT chest/abdomin  IMPRESSION: 1. Bilobar hepatic metastasis are similar to recent PET-CT but progressed from CT May 11, 2023. 2. Extensive osseous metastatic disease involving the axial and proximal appendicular skeleton with a few areas showing increased sclerosis which may reflect treatment response but is nonspecific. Suggest attention on follow-up imaging. 3. Stable right axillary and right external iliac lymph nodes. 4. Subcutaneous/cutaneous nodular soft tissue  thickening in the posterior upper back which was hypermetabolic on prior PET-CT is similar to that examination, compatible with disease involvement. 5. Gas fluid levels in a patulous esophagus, suggestive of gastroesophageal reflux. 6. Colonic stool burden compatible with constipation.   7/2 PET  IMPRESSION: 1. Overall disease progression as evidenced by increase in the hypermetabolic right axillary lymph nodes, new hypermetabolic mediastinal adenopathy and new hepatic metastases. 2. New left cervical level II lymph node, worrisome for metastatic disease. 3. Minimal improvement in right external iliac adenopathy. 4. Overall minimal improvement in osseous metastatic disease.   6/6:  Head CT  IMPRESSION: 1. No acute intracranial abnormality. 2. Extensive, predominantly stable areas of osseous metastasis throughout the calvarium. 3. New area of enhancing soft tissue attenuation along the lateral aspect of the  right globe, as described above. MRI correlation is recommended. 4. Multiple areas of soft tissue attenuation throughout the scalp, mildly increased in size and number when compared to the prior study. Mild interval progression of metastatic disease cannot be excluded.    COGNITION: Overall cognitive status: Within functional limits for tasks assessed   SENSATION: WFL  COORDINATION: Limited due to ROM deficits. WFL    MUSCLE TONE: WFL    POSTURE: rounded Lucas, forward head, and decreased lumbar lordosis  LOWER EXTREMITY MMT:   MMT  Right Eval Left Eval  Hip flexion 4 4  Hip extension    Hip abduction 4 4  Hip adduction 4+ 4+  Hip internal rotation    Hip external rotation    Knee flexion 4+ 4+  Knee extension 4+ 4+  Ankle dorsiflexion 5 5  Ankle plantarflexion 4 4  Ankle inversion    Ankle eversion     (Blank rows = not tested) MMT  Right Eval Left Eval  Shoulder  flexion 4- 4  shoulder extension 4+ 4+  Shoulder abduction 4 4  Shoulder  adduction 4+ 4+  Shoulder  internal rotation 4- 4  Shoulder external rotation 4+ 4+  Elbow flexion 4 4+  Elbow extension 4+ 4+                    LOWER EXTREMITY ROM:     Limited shoulder flexion and abduction to ~ 85 DEG  on the R shoulder   BED MOBILITY:  Sit to supine SBA Supine to sit SBA  TRANSFERS: Assistive device utilized: None  Sit to stand: Modified independence Stand to sit: Modified independence Chair to chair: Modified independence Floor: Modified independence   CURB:  Level of Assistance: CGA Assistive device utilized: Youth Worker Comments: step to pattern  STAIRS: Level of Assistance: CGA Stair Negotiation Technique: Step to Pattern with Bilateral Rails Number of Stairs: 4  Height of Stairs: 6  Comments: step to with BUE support   GAIT: Gait pattern: step through pattern genu recurvatum on the RLE.   Distance walked: 164ft Assistive device utilized: None Level of assistance: SBA Comments: no circumduction, greatly improved since last bout of PT treatment.   FUNCTIONAL TESTS:  5 times sit to stand: 16.57 sec with UE support   Timed up and go (TUG): 9.4 sec   6 minute walk test: 1090 10 meter walk test:  9.55 sec (1.87m/s)  Berg Balance Scale: 47/56 Functional gait assessment: 19/30   PATIENT SURVEYS:  ABC scale: The Activities-Specific Balance Confidence (ABC) Scale 0% 10 20 30  40 50 60 70 80 90 100% No confidence<->completely confident  "How confident are you that you will not lose your balance or become unsteady when you . . .   Date tested 11/24/23   Walk around the house 100%  2. Walk up or down stairs 100%  3. Bend over and pick up a slipper from in front of a closet floor 100%  4. Reach for a small can off a shelf at eye level 100%  5. Stand on tip toes and reach for something above your head 100%  6. Stand on a chair and reach for something 60%  7. Sweep the floor 100%  8. Walk outside the house to a car parked in the driveway  100%  9. Get into or out of a car 100%  10. Walk across a parking lot to the mall 100%  11. Walk up or down a ramp 100%  12. Walk in a crowded mall where people rapidly walk past you 100%  13. Are bumped into by people as you walk through the mall 80%  14. Step onto or off of an escalator while you are holding onto the railing 100%  15. Step onto or off an escalator while holding onto parcels such that you cannot hold onto the railing 80%  16. Walk outside on icy sidewalks 70%  Total: #/16  93%     [facility] PT Assessment - 12/08/23 0001       Assessment   Hand Dominance Right      Berg Balance Test   Sit to Stand Able to stand  independently using hands    Standing Unsupported Able to stand safely 2 minutes    Sitting with Back Unsupported but Feet Supported on Floor or Stool Able to sit safely and securely 2 minutes    Stand to Sit Controls descent by using hands    Transfers Able to transfer safely, definite need of hands    Standing Unsupported with Eyes Closed Able to stand 10 seconds safely    Standing Unsupported with Feet Together Able to place feet together independently and stand 1 minute safely    From Standing, Reach Forward with Outstretched Arm Can reach forward >12 cm safely (5)    From Standing Position, Pick up Object from Floor Able to pick up shoe safely and easily    From Standing Position, Turn to Look Behind Over each Shoulder Looks behind one side only/other side shows less weight shift    Turn 360 Degrees Able to turn 360 degrees safely one side only in 4 seconds or less    Standing Unsupported, Alternately Place Feet on Step/Stool Able to stand independently and safely and complete 8 steps in 20 seconds    Standing Unsupported, One Foot in Front Able to plae foot ahead of the other independently and hold 30 seconds    Standing on One Leg Able to lift leg independently and hold equal to or more than 3 seconds      Functional Gait  Assessment   Gait Level Surface  Walks 20 ft in less than 5.5 sec, no assistive devices, good speed, no evidence for imbalance, normal gait pattern, deviates no more than 6 in outside of the 12 in walkway width.    Change in Gait Speed Able to smoothly change walking speed without loss of balance or gait deviation. Deviate no more than 6 in outside of the 12 in walkway width.    Gait with Horizontal Head Turns Performs head turns smoothly with slight change in gait velocity (eg, minor disruption to smooth gait path), deviates 6-10 in outside 12 in walkway width, or uses an assistive device.    Gait with Vertical Head Turns Performs head turns with no change in gait. Deviates no more than 6 in outside 12 in walkway width.    Gait and Pivot Turn Pivot turns safely within 3 sec and stops quickly with no loss of balance.    Step Over Obstacle Is able to step over one shoe box (4.5 in total height) but must slow down and adjust steps to clear box safely. May require verbal cueing.    Gait with Narrow Base of Support Ambulates less than 4 steps heel to toe or cannot perform without assistance.    Gait with Eyes Closed Walks 20 ft, slow speed, abnormal gait pattern, evidence for imbalance, deviates 10-15 in outside 12 in  walkway width. Requires more than 9 sec to ambulate 20 ft.    Ambulating Backwards Walks 20 ft, slow speed, abnormal gait pattern, evidence for imbalance, deviates 10-15 in outside 12 in walkway width.    Steps Alternating feet, must use rail.         UPPER EXTREMITY ROM:  Active ROM Right eval Left eval  Shoulder flexion 80 100  Shoulder extension    Shoulder abduction 60 80  Shoulder adduction    Shoulder extension    Shoulder internal rotation  [facility]  Shoulder external rotation -10deg 20deg   Elbow flexion    Elbow extension    Wrist flexion    Wrist extension    Wrist ulnar deviation    Wrist radial deviation    Wrist pronation    Wrist supination     (Blank rows = not tested)   TODAY'S  TREATMENT:                                                                                                                              DATE: 02/09/2024  Nustep reciprocal movement training x 10 min rolling hill program level 2-6.  Was able to utilize BLE and BUE throughout without rest break.   Therapy ball rollout x 15 with BUE in pain free ROM.   Wand flexion with pool noodle with active hrz abduction with flexion x 15   Finger ladder x 4 bil; able to reach 20-21 on the RUE and 26 on the LUE.   Seated shoulder ER and scap retraction x 20  Seated low row x 15 YTB cues for proper technique.  Bicep curl YTB x 12 bil  Standing wall pushup x 5, but reports mild pain In the R shoulder, so intervention ceased Shoulder extension YTB x 15  Sit<>stand with basketball press overhead x 10    PATIENT EDUCATION: Education details: Pt educated throughout session about proper posture and technique with exercises. Improved exercise technique, movement at target joints, use of target muscles after min to mod verbal, visual, tactile cues.  Person educated: Patient Education method: Explanation Education comprehension: verbalized understanding  HOME EXERCISE PROGRAM:   Access Code: [email or web address] URL: [email or web address] Date: 01/27/2024 Prepared by: Massie Dollar  Exercises - Seated Shoulder Flexion Towel Slide at Table Top  - 1 x daily - 7 x weekly - 3 sets - 10 reps - Seated Shoulder Abduction Towel Slide at Table Top  - 1 x daily - 7 x weekly - 3 sets - 10 reps - Shoulder External Rotation and Scapular Retraction  - 1 x daily - 7 x weekly - 3 sets - 10 reps  Access Code: [email or web address] URL: [email or web address] Date: 12/08/2023 Prepared by: Massie Dollar  Exercises - Sit to Stand with Armchair  - 1 x daily - 7 x weekly - 3 sets - 10 reps - Seated Long Arc Quad  - 1 x daily - 7 x weekly - 3  sets - 10 reps - Standing March with Counter Support  - 1 x daily - 7 x  weekly - 3 sets - 10 reps - Seated March  - 1 x daily - 7 x weekly - 3 sets - 10 reps - Side Stepping with Counter Support  - 1 x daily - 7 x weekly - 3 sets - 10 reps - Seated Heel Raise  - 1 x daily - 7 x weekly - 3 sets - 10 reps - Seated Toe Raise  - 1 x daily - 7 x weekly - 3 sets - 10 repsit.   GOALS: Goals reviewed with patient? Yes  SHORT TERM GOALS: Target date: 12/22/2023   Patient will be independent in home exercise program to improve strength/mobility for better functional independence with ADLs. Baseline: to provide at session 1 or 2  Goal status: INITIAL   LONG TERM GOALS: Target date: 01/19/2024    Patient will increase LEFS score to equal to or greater than 8 points   to demonstrate statistically significant improvement in mobility and quality of life.  Baseline:  to be completed   Goal status: INITIAL  2.  Patient (> 17 years old) will complete five times sit to stand test in < 15 seconds without use of BUE indicating an increased LE strength and improved balance. Baseline: 16.57 sec with UE supported on arm rest  Goal status: INITIAL  3.  Patient will increase Berg Balance score by > 6 points to demonstrate decreased fall risk during functional activities Baseline: 47/56 Goal status: INITIAL  4.  Patient will increase 6 min walk test to >1215ft as to improve gait speed for better community ambulation and to reduce fall risk. Baseline:1023ft Goal status: INITIAL    5.  Patient will ascend and descent stairs without UE support to improve independence with access to home and community   Goal status: INITIAL  6.  Patient will increase FGA score to >22 as to demonstrate reduced fall risk and improved dynamic gait balance for better safety with community/home ambulation. Baseline:19 Goal status: INITIAL  7.  Patient will increase R shoulder ROM in flexion/abduction and ER to allow pt reach to top of head for hygiene and grooming task Baseline:able to reach  hairline or just superior to the R on the R side.  Goal status: INITIAL/new      ASSESSMENT:  CLINICAL IMPRESSION: Pt put forth excellent effort for PT treatment to  address functional deficits listed above secondary to continued CA treatment.  PT treatment focused on generalized strengthening and shoulder rom AAROM and AROM for reaching tasks, as pt reports that this is still her greatest limiting factor functionally. Tolerated all interventions well on this day with only mild discomfort reported with shoulder flexion motions. Still limited to ~ 90 deg flexion. Pt will benefit from skilled PT treatment  to address balance, strength and ROM deficits, improve safety and QoL while continuing CA treatment.    OBJECTIVE IMPAIRMENTS: Abnormal gait, cardiopulmonary status limiting activity, decreased activity tolerance, decreased balance, decreased endurance, decreased knowledge of condition, decreased mobility, difficulty walking, decreased ROM, decreased strength, hypomobility, increased fascial restrictions, impaired perceived functional ability, impaired flexibility, impaired UE functional use, improper body mechanics, postural dysfunction, and pain.   ACTIVITY LIMITATIONS: carrying, lifting, bending, standing, squatting, sleeping, stairs, transfers, bathing, toileting, dressing, reach over head, and locomotion level  PARTICIPATION LIMITATIONS: cleaning, laundry, driving, shopping, community activity, and yard work  PERSONAL FACTORS: Age, Behavior pattern, Fitness, Past/current experiences, and 1-2  comorbidities: CA with profound metastases  are also affecting patient's functional outcome.   REHAB POTENTIAL: Good  CLINICAL DECISION MAKING: Evolving/moderate complexity  EVALUATION COMPLEXITY: Moderate  PLAN:  PT FREQUENCY: 1-2x/week  PT DURATION: 8 weeks  PLANNED INTERVENTIONS: Therapeutic exercises, Therapeutic activity, Neuromuscular re-education, Balance training, Gait training,  Patient/Family education, Self Care, Joint mobilization, Stair training, DME instructions, Cryotherapy, Moist heat, and Manual therapy  PLAN FOR NEXT SESSION:   Generalized strengthening for BUE/ UE/cervical ROM as appropriate.     Massie FORBES Dollar, PT 02/09/2024, 3:51 PM

## 2024-02-11 ENCOUNTER — Other Ambulatory Visit (HOSPITAL_COMMUNITY): Payer: Self-pay

## 2024-02-15 ENCOUNTER — Other Ambulatory Visit: Payer: Self-pay

## 2024-02-15 ENCOUNTER — Other Ambulatory Visit: Payer: Self-pay | Admitting: Pharmacy Technician

## 2024-02-15 NOTE — Progress Notes (Signed)
 Specialty Pharmacy Refill Coordination Note  Kathryn Lucas is a 63 y.o. female contacted today regarding refills of specialty medication(s) Capecitabine  (XELODA )   Patient requested Delivery   Delivery date: 02/18/24   Verified address: [address]  [place]   Medication will be filled on: 02/17/24

## 2024-02-16 ENCOUNTER — Ambulatory Visit: Attending: Hematology and Oncology | Admitting: Physical Therapy

## 2024-02-16 DIAGNOSIS — M25611 Stiffness of right shoulder, not elsewhere classified: Secondary | ICD-10-CM | POA: Diagnosis present

## 2024-02-16 DIAGNOSIS — M542 Cervicalgia: Secondary | ICD-10-CM | POA: Insufficient documentation

## 2024-02-16 DIAGNOSIS — M6281 Muscle weakness (generalized): Secondary | ICD-10-CM | POA: Diagnosis present

## 2024-02-16 DIAGNOSIS — M25612 Stiffness of left shoulder, not elsewhere classified: Secondary | ICD-10-CM | POA: Insufficient documentation

## 2024-02-16 DIAGNOSIS — R262 Difficulty in walking, not elsewhere classified: Secondary | ICD-10-CM | POA: Insufficient documentation

## 2024-02-16 DIAGNOSIS — M436 Torticollis: Secondary | ICD-10-CM | POA: Insufficient documentation

## 2024-02-16 DIAGNOSIS — R29898 Other symptoms and signs involving the musculoskeletal system: Secondary | ICD-10-CM | POA: Insufficient documentation

## 2024-02-16 NOTE — Therapy (Signed)
 OUTPATIENT PHYSICAL treatment/  Discharge Summary    Patient Name: Kathryn Lucas MRN: [id number] DOB:12-31-1960, 63 y.o., female Today's Date: 02/16/2024   PCP: Vernon Velna SAUNDERS, MD  REFERRING PROVIDER: Odean Potts, MD   END OF SESSION:  PT End of Session - 02/16/24 1543     Visit Number 12    Number of Visits 18    Date for Recertification  03/12/24    PT Start Time 1541    PT Stop Time 1615    PT Time Calculation (min) 34 min    Equipment Utilized During Treatment Gait belt    Activity Tolerance Patient tolerated treatment well    Behavior During Therapy [facility] for tasks assessed/performed          Past Medical History:  Diagnosis Date   Breast cancer (HCC)    left breast cancer   Cancer (HCC) 09/2017   left breast cancer   Family history of breast cancer    Hypothyroidism    Personal history of radiation therapy 2019   Thyroid  disease    Past Surgical History:  Procedure Laterality Date   BREAST BIOPSY Left 2018   BREAST BIOPSY Left 2019   BREAST RECONSTRUCTION WITH PLACEMENT OF TISSUE EXPANDER AND ALLODERM Left 10/26/2017   Procedure: LEFT BREAST RECONSTRUCTION WITH PLACEMENT OF TISSUE EXPANDER AND ALLODERM;  Surgeon: Arelia Filippo, MD;  Location: [facility];  Service: Plastics;  Laterality: Left;   IR IMAGING GUIDED PORT INSERTION  04/27/2023   MASTECTOMY Left 2019   MASTECTOMY WITH RADIOACTIVE SEED GUIDED EXCISION AND AXILLARY SENTINEL LYMPH NODE BIOPSY Left 10/26/2017   Procedure: LEFT MASTECTOMY WITH SEED TARGETED  LEFT AXILLARY LYMPH NODE EXCISION AND LEFT SENTINEL LYMPH NODE BIOPSY;  Surgeon: Aron Shoulders, MD;  Location: [facility];  Service: General;  Laterality: Left;   TONSILLECTOMY     WISDOM TOOTH EXTRACTION     Patient Active Problem List   Diagnosis Date Noted   Focal seizure (HCC) 09/02/2023   Metastatic 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) 10/14/2022   Elevated liver enzymes 10/14/2022   Unexplained weight loss  10/14/2022   Neoplasm related pain 10/14/2022   Metastatic malignant neoplasm (HCC) 12/19/2021   Family history of breast cancer    History of therapeutic radiation 04/27/2018   Breast cancer, left breast (HCC) 10/26/2017   Malignant neoplasm of lower-outer quadrant of left breast of female, estrogen receptor positive (HCC) 07/15/2016   Plantar fasciitis 07/30/2015   Metatarsalgia 10/18/2014    ONSET DATE: September 2023   REFERRING DIAG:  Diagnosis  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   Pt states that she is ready to d/c. Feels like she has gotten a lot stronger; she is going to chair yoga and group fitness classes for the last few weeks which is also helping improve neck ROM and pain.      From Eval:   Pt arrives at PT after prolonged hiatus from PT due to progress reports at that time. Reports that she  had restarted CA treatment via radiation to address metastases of skull and Spine. Completed radiation therapy  in September. Scans show improvement in cerebral metastases .   Pt reports that she feels like her legs and arms are weaker now after completing recent Radiation treatment. Wants to be able to stand in one place for increased time.   Pt accompanied by: none.   PERTINENT HISTORY:   Kathryn Lucas is a 63 year old female with metastatic cancer who presents for follow-up on her recent CT  scans and lab results.   Her recent CT scans show stable metastatic lesions in the liver, bones, lymph nodes, and soft tissue thickening in the back, with resolution of skull lesions.   Pt had exacerbation of s/s in June of 2024 with hospitalization and subsequent change in oncology treatment limiting ability to attend PT. Pt reports recent decline again in function following radiation in summer 2025.   PAIN:  Are you having pain? No  PRECAUTIONS: Cervical and Other: cervical for comfort   RED FLAGS: None   WEIGHT BEARING RESTRICTIONS: No  FALLS: Has patient fallen in last 6 months? Yes. Number of falls 1  LIVING ENVIRONMENT: Lives with: lives alone Lives in: House/apartment Stairs: yes, but uses ramp  Has following equipment at home: Walker - 4 wheeled  PLOF: Needs assistance with ADLs and Needs assistance with homemaking  PATIENT GOALS: improve balance, strength and endurance. Stand at kitchen longer and   OBJECTIVE:   DIAGNOSTIC FINDINGS:  11/17/2023  CT head IMPRESSION: 1. No acute intracranial abnormality. 2. 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 3. Stable heterogeneous lesion involving the right frontal calvarium and additional scattered lytic lesions throughout the visualized calvarium. 4. Decreased soft tissue along the lateral aspect of the right globe, possibly representing mild prominence of the lacrimal gland. The orbits are otherwise unremarkable.  CT chest/abdomin  IMPRESSION: 1. Bilobar hepatic metastasis are similar to recent PET-CT but progressed from CT May 11, 2023. 2. Extensive osseous metastatic disease involving the axial and proximal appendicular skeleton with a few areas showing increased sclerosis which may reflect treatment response but is nonspecific. Suggest attention on follow-up imaging. 3. Stable right axillary and right external iliac  lymph nodes. 4. Subcutaneous/cutaneous nodular soft tissue thickening in the posterior upper back which was hypermetabolic on prior PET-CT is similar to that examination, compatible with disease involvement. 5. Gas fluid levels in a patulous esophagus, suggestive of gastroesophageal reflux. 6. Colonic stool burden compatible with constipation.   7/2 PET  IMPRESSION: 1. Overall disease progression as evidenced by increase in the hypermetabolic right axillary lymph nodes, new hypermetabolic mediastinal adenopathy and new hepatic metastases. 2. New left cervical level II lymph node, worrisome for metastatic disease. 3. Minimal improvement in right external iliac adenopathy. 4. Overall minimal improvement in osseous metastatic disease.   6/6:  Head CT  IMPRESSION: 1. No acute intracranial abnormality. 2. Extensive, predominantly stable areas of osseous metastasis throughout the calvarium. 3. New area of  enhancing soft tissue attenuation along the lateral aspect of the right globe, as described above. MRI correlation is recommended. 4. Multiple areas of soft tissue attenuation throughout the scalp, mildly increased in size and number when compared to the prior study. Mild interval progression of metastatic disease cannot be excluded.    COGNITION: Overall cognitive status: Within functional limits for tasks assessed   SENSATION: WFL  COORDINATION: Limited due to ROM deficits. WFL    MUSCLE TONE: WFL    POSTURE: rounded shoulders, forward head, and decreased lumbar lordosis  LOWER EXTREMITY MMT:   MMT  Right Eval Left Eval  Hip flexion 4 4  Hip extension    Hip abduction 4 4  Hip adduction 4+ 4+  Hip internal rotation    Hip external rotation    Knee flexion 4+ 4+  Knee extension 4+ 4+  Ankle dorsiflexion 5 5  Ankle plantarflexion 4 4  Ankle inversion    Ankle eversion     (Blank rows = not tested) MMT  Right Eval Left Eval  Shoulder  flexion 4- 4   shoulder extension 4+ 4+  Shoulder abduction 4 4  Shoulder adduction 4+ 4+  Shoulder  internal rotation 4- 4  Shoulder external rotation 4+ 4+  Elbow flexion 4 4+  Elbow extension 4+ 4+                    LOWER EXTREMITY ROM:     Limited shoulder flexion and abduction to ~ 85 DEG  on the R shoulder   BED MOBILITY:  Sit to supine SBA Supine to sit SBA  TRANSFERS: Assistive device utilized: None  Sit to stand: Modified independence Stand to sit: Modified independence Chair to chair: Modified independence Floor: Modified independence   CURB:  Level of Assistance: CGA Assistive device utilized: Youth Worker Comments: step to pattern  STAIRS: Level of Assistance: CGA Stair Negotiation Technique: Step to Pattern with Bilateral Rails Number of Stairs: 4  Height of Stairs: 6  Comments: step to with BUE support   GAIT: Gait pattern: step through pattern genu recurvatum on the RLE.   Distance walked: 112ft Assistive device utilized: None Level of assistance: SBA Comments: no circumduction, greatly improved since last bout of PT treatment.   FUNCTIONAL TESTS:  5 times sit to stand: 16.57 sec with UE support   Timed up and go (TUG): 9.4 sec   6 minute walk test: 1090 10 meter walk test:  9.55 sec (1.89m/s)  Berg Balance Scale: 47/56 Functional gait assessment: 19/30   PATIENT SURVEYS:  ABC scale: The Activities-Specific Balance Confidence (ABC) Scale 0% 10 20 30  40 50 60 70 80 90 100% No confidence<->completely confident  "How confident are you that you will not lose your balance or become unsteady when you . . .   Date tested 11/24/23   Walk around the house 100%  2. Walk up or down stairs 100%  3. Bend over and pick up a slipper from in front of a closet floor 100%  4. Reach for a small can off a shelf at eye level 100%  5. Stand on tip toes and reach for something above your head 100%  6. Stand on a chair and reach for something 60%  7. Sweep the floor 100%   8. Walk outside the house to a car parked in the driveway 100%  9. Get into or out of a car 100%  10. Walk across a parking lot to the mall  100%  11. Walk up or down a ramp 100%  12. Walk in a crowded mall where people rapidly walk past you 100%  13. Are bumped into by people as you walk through the mall 80%  14. Step onto or off of an escalator while you are holding onto the railing 100%  15. Step onto or off an escalator while holding onto parcels such that you cannot hold onto the railing 80%  16. Walk outside on icy sidewalks 70%  Total: #/16  93%     [facility] PT Assessment - 12/08/23 0001       Assessment   Hand Dominance Right      Berg Balance Test   Sit to Stand Able to stand  independently using hands    Standing Unsupported Able to stand safely 2 minutes    Sitting with Back Unsupported but Feet Supported on Floor or Stool Able to sit safely and securely 2 minutes    Stand to Sit Controls descent by using hands    Transfers Able to transfer safely, definite need of hands    Standing Unsupported with Eyes Closed Able to stand 10 seconds safely    Standing Unsupported with Feet Together Able to place feet together independently and stand 1 minute safely    From Standing, Reach Forward with Outstretched Arm Can reach forward >12 cm safely (5)    From Standing Position, Pick up Object from Floor Able to pick up shoe safely and easily    From Standing Position, Turn to Look Behind Over each Shoulder Looks behind one side only/other side shows less weight shift    Turn 360 Degrees Able to turn 360 degrees safely one side only in 4 seconds or less    Standing Unsupported, Alternately Place Feet on Step/Stool Able to stand independently and safely and complete 8 steps in 20 seconds    Standing Unsupported, One Foot in Front Able to plae foot ahead of the other independently and hold 30 seconds    Standing on One Leg Able to lift leg independently and hold equal to or more than 3  seconds      Functional Gait  Assessment   Gait Level Surface Walks 20 ft in less than 5.5 sec, no assistive devices, good speed, no evidence for imbalance, normal gait pattern, deviates no more than 6 in outside of the 12 in walkway width.    Change in Gait Speed Able to smoothly change walking speed without loss of balance or gait deviation. Deviate no more than 6 in outside of the 12 in walkway width.    Gait with Horizontal Head Turns Performs head turns smoothly with slight change in gait velocity (eg, minor disruption to smooth gait path), deviates 6-10 in outside 12 in walkway width, or uses an assistive device.    Gait with Vertical Head Turns Performs head turns with no change in gait. Deviates no more than 6 in outside 12 in walkway width.    Gait and Pivot Turn Pivot turns safely within 3 sec and stops quickly with no loss of balance.    Step Over Obstacle Is able to step over one shoe box (4.5 in total height) but must slow down and adjust steps to clear box safely. May require verbal cueing.    Gait with Narrow Base of Support Ambulates less than 4 steps heel to toe or cannot perform without assistance.    Gait with Eyes Closed Walks 20 ft, slow speed, abnormal  gait pattern, evidence for imbalance, deviates 10-15 in outside 12 in walkway width. Requires more than 9 sec to ambulate 20 ft.    Ambulating Backwards Walks 20 ft, slow speed, abnormal gait pattern, evidence for imbalance, deviates 10-15 in outside 12 in walkway width.    Steps Alternating feet, must use rail.           [facility] PT Assessment - 02/16/24 0001       Berg Balance Test   Sit to Stand Able to stand without using hands and stabilize independently    Standing Unsupported Able to stand safely 2 minutes    Sitting with Back Unsupported but Feet Supported on Floor or Stool Able to sit safely and securely 2 minutes    Stand to Sit Sits safely with minimal use of hands    Transfers Able to transfer safely, minor use  of hands    Standing Unsupported with Eyes Closed Able to stand 10 seconds safely    Standing Unsupported with Feet Together Able to place feet together independently and stand 1 minute safely    From Standing, Reach Forward with Outstretched Arm Can reach forward >12 cm safely (5)    From Standing Position, Pick up Object from Floor Able to pick up shoe safely and easily    From Standing Position, Turn to Look Behind Over each Shoulder Looks behind one side only/other side shows less weight shift    Turn 360 Degrees Able to turn 360 degrees safely in 4 seconds or less    Standing Unsupported, Alternately Place Feet on Step/Stool Able to stand independently and safely and complete 8 steps in 20 seconds    Standing Unsupported, One Foot in Front Able to plae foot ahead of the other independently and hold 30 seconds    Standing on One Leg Able to lift leg independently and hold equal to or more than 3 seconds    Total Score 51      Functional Gait  Assessment   Gait Level Surface Walks 20 ft in less than 5.5 sec, no assistive devices, good speed, no evidence for imbalance, normal gait pattern, deviates no more than 6 in outside of the 12 in walkway width.    Change in Gait Speed Able to smoothly change walking speed without loss of balance or gait deviation. Deviate no more than 6 in outside of the 12 in walkway width.    Gait with Horizontal Head Turns Performs head turns smoothly with no change in gait. Deviates no more than 6 in outside 12 in walkway width    Gait with Vertical Head Turns Performs head turns with no change in gait. Deviates no more than 6 in outside 12 in walkway width.    Gait and Pivot Turn Pivot turns safely within 3 sec and stops quickly with no loss of balance.    Step Over Obstacle Is able to step over one shoe box (4.5 in total height) without changing gait speed. No evidence of imbalance.    Gait with Narrow Base of Support Ambulates 7-9 steps.    Gait with Eyes Closed  Walks 20 ft, uses assistive device, slower speed, mild gait deviations, deviates 6-10 in outside 12 in walkway width. Ambulates 20 ft in less than 9 sec but greater than 7 sec.    Ambulating Backwards Walks 20 ft, uses assistive device, slower speed, mild gait deviations, deviates 6-10 in outside 12 in walkway width.    Steps Alternating feet, no rail.  Total Score 26          UPPER EXTREMITY ROM:  Active ROM Right eval Left eval  Shoulder flexion 80 100  Shoulder extension    Shoulder abduction 60 80  Shoulder adduction    Shoulder extension    Shoulder internal rotation  [facility]  Shoulder external rotation -10deg 20deg   Elbow flexion    Elbow extension    Wrist flexion    Wrist extension    Wrist ulnar deviation    Wrist radial deviation    Wrist pronation    Wrist supination     (Blank rows = not tested)   TODAY'S TREATMENT:                                                                                                                              DATE: 02/16/2024 Patient demonstrates increased fall risk as noted by score of   51/56 on Berg Balance Scale.  (<36= high risk for falls, close to 100%; 37-45 significant >80%; 46-51 moderate >50%; 52-55 lower >25%)  Patient demonstrates increased fall risk as noted by score of 26/30 on  Functional Gait Assessment.   <22/30 = predictive of falls, <20/30 = fall in 6 months, <18/30 = predictive of falls in PD MCID: 5 points stroke population, 4 points geriatric population (ANPTA Core Set of Outcome Measures for Adults with Neurologic Conditions, 2018)'  Pt performed 5 time sit<>stand (5xSTS): 11.7 sec (>15 sec indicates increased fall risk)      PATIENT EDUCATION: Education details: Pt educated throughout session about proper posture and technique with exercises. Improved exercise technique, movement at target joints, use of target muscles after min to mod verbal, visual, tactile cues.  Person educated: Patient Education  method: Explanation Education comprehension: verbalized understanding  HOME EXERCISE PROGRAM:   Access Code: [email or web address] URL: [email or web address] Date: 01/27/2024 Prepared by: Massie Dollar  Exercises - Seated Shoulder Flexion Towel Slide at Table Top  - 1 x daily - 7 x weekly - 3 sets - 10 reps - Seated Shoulder Abduction Towel Slide at Table Top  - 1 x daily - 7 x weekly - 3 sets - 10 reps - Shoulder External Rotation and Scapular Retraction  - 1 x daily - 7 x weekly - 3 sets - 10 reps  Access Code: [email or web address] URL: [email or web address] Date: 12/08/2023 Prepared by: Massie Dollar  Exercises - Sit to Stand with Armchair  - 1 x daily - 7 x weekly - 3 sets - 10 reps - Seated Long Arc Quad  - 1 x daily - 7 x weekly - 3 sets - 10 reps - Standing March with Counter Support  - 1 x daily - 7 x weekly - 3 sets - 10 reps - Seated March  - 1 x daily - 7 x weekly - 3 sets - 10 reps - Side Stepping with Counter Support  - 1 x daily - 7 x weekly - 3  sets - 10 reps - Seated Heel Raise  - 1 x daily - 7 x weekly - 3 sets - 10 reps - Seated Toe Raise  - 1 x daily - 7 x weekly - 3 sets - 10 repsit.   GOALS: Goals reviewed with patient? Yes  SHORT TERM GOALS: Target date: 12/22/2023   Patient will be independent in home exercise program to improve strength/mobility for better functional independence with ADLs. Baseline: to provide at session 1 or 2  Goal status: INITIAL   LONG TERM GOALS: Target date: 01/19/2024    Patient will increase LEFS score to equal to or greater than 8 points   to demonstrate statistically significant improvement in mobility and quality of life.  Baseline:  to be completed   Goal status: INITIAL  2.  Patient (> 24 years old) will complete five times sit to stand test in < 15 seconds without use of BUE indicating an increased LE strength and improved balance. Baseline: 16.57 sec with UE supported on arm rest  12/3: 11.73 without UE  support  Goal status: MET  3.  Patient will increase Berg Balance score by > 6 points to demonstrate decreased fall risk during functional activities Baseline: 47/56 12/3: 51/56 Goal status: IN PROGRESS  4.  Patient will increase 6 min walk test to >1250ft as to improve gait speed for better community ambulation and to reduce fall risk. Baseline:1084ft  Goal status: INITIAL    5.  Patient will ascend and descent stairs without UE support to improve independence with access to home and community 12/3: reciprocal with no UE support, but hesitant to initiate    Goal status: MET  6.  Patient will increase FGA score to >22 as to demonstrate reduced fall risk and improved dynamic gait balance for better safety with community/home ambulation. Baseline:19 12/3: 26 Goal status: MET  7.  Patient will increase R shoulder ROM in flexion/abduction and ER to allow pt reach to top of head for hygiene and grooming task Baseline:able to reach hairline or just superior to the R on the R side.  12/3: able to reach beyond hairline consistently  Goal status: In progress     ASSESSMENT:  CLINICAL IMPRESSION: Pt put forth excellent effort for PT treatment to assess progress for d/c assessment. Pt has met 3 of 7 LTG with improvement in all goals measured on this day. Most notably, pt can now perform 5x STS without UE support and below fall risk threshold. Pt feels ready to transition to community fitness and mobility program.  Due to progress, pt will no longer require skilled PT treatment.   OBJECTIVE IMPAIRMENTS: Abnormal gait, cardiopulmonary status limiting activity, decreased activity tolerance, decreased balance, decreased endurance, decreased knowledge of condition, decreased mobility, difficulty walking, decreased ROM, decreased strength, hypomobility, increased fascial restrictions, impaired perceived functional ability, impaired flexibility, impaired UE functional use, improper body mechanics,  postural dysfunction, and pain.   ACTIVITY LIMITATIONS: carrying, lifting, bending, standing, squatting, sleeping, stairs, transfers, bathing, toileting, dressing, reach over head, and locomotion level  PARTICIPATION LIMITATIONS: cleaning, laundry, driving, shopping, community activity, and yard work  PERSONAL FACTORS: Age, Behavior pattern, Fitness, Past/current experiences, and 1-2 comorbidities: CA with profound metastases  are also affecting patient's functional outcome.   REHAB POTENTIAL: Good  CLINICAL DECISION MAKING: Evolving/moderate complexity  EVALUATION COMPLEXITY: Moderate  PLAN:  PT FREQUENCY: 1-2x/week  PT DURATION: 8 weeks  PLANNED INTERVENTIONS: Therapeutic exercises, Therapeutic activity, Neuromuscular re-education, Balance training, Gait training, Patient/Family education, Self Care,  Joint mobilization, Stair training, DME instructions, Cryotherapy, Moist heat, and Manual therapy  PLAN FOR NEXT SESSION:   Generalized strengthening for BUE/ UE/cervical ROM as appropriate.     Massie FORBES Dollar, PT 02/16/2024, 3:45 PM

## 2024-02-17 ENCOUNTER — Encounter: Payer: Self-pay | Admitting: Hematology and Oncology

## 2024-02-17 ENCOUNTER — Other Ambulatory Visit: Payer: Self-pay

## 2024-02-18 ENCOUNTER — Encounter: Attending: Physician Assistant | Admitting: Physician Assistant

## 2024-02-18 DIAGNOSIS — C44599 Other specified malignant neoplasm of skin of other part of trunk: Secondary | ICD-10-CM | POA: Insufficient documentation

## 2024-02-18 DIAGNOSIS — E038 Other specified hypothyroidism: Secondary | ICD-10-CM | POA: Insufficient documentation

## 2024-02-18 DIAGNOSIS — L98492 Non-pressure chronic ulcer of skin of other sites with fat layer exposed: Secondary | ICD-10-CM | POA: Diagnosis not present

## 2024-02-18 DIAGNOSIS — L988 Other specified disorders of the skin and subcutaneous tissue: Secondary | ICD-10-CM | POA: Diagnosis not present

## 2024-02-18 DIAGNOSIS — L0211 Cutaneous abscess of neck: Secondary | ICD-10-CM | POA: Insufficient documentation

## 2024-02-18 DIAGNOSIS — T66XXXA Radiation sickness, unspecified, initial encounter: Secondary | ICD-10-CM | POA: Diagnosis not present

## 2024-02-18 DIAGNOSIS — L98421 Non-pressure chronic ulcer of back limited to breakdown of skin: Secondary | ICD-10-CM | POA: Insufficient documentation

## 2024-02-18 DIAGNOSIS — X58XXXA Exposure to other specified factors, initial encounter: Secondary | ICD-10-CM | POA: Insufficient documentation

## 2024-02-29 ENCOUNTER — Other Ambulatory Visit (HOSPITAL_COMMUNITY): Payer: Self-pay

## 2024-03-01 ENCOUNTER — Other Ambulatory Visit: Payer: Self-pay

## 2024-03-01 ENCOUNTER — Encounter: Payer: Self-pay | Admitting: Hematology and Oncology

## 2024-03-01 ENCOUNTER — Inpatient Hospital Stay

## 2024-03-01 ENCOUNTER — Inpatient Hospital Stay: Attending: Hematology and Oncology

## 2024-03-01 ENCOUNTER — Inpatient Hospital Stay: Admitting: Hematology and Oncology

## 2024-03-01 ENCOUNTER — Other Ambulatory Visit: Payer: Self-pay | Admitting: Pharmacy Technician

## 2024-03-01 VITALS — BP 133/83 | HR 112 | Temp 97.8°F | Resp 18 | Ht 70.0 in | Wt 156.2 lb

## 2024-03-01 DIAGNOSIS — Z79899 Other long term (current) drug therapy: Secondary | ICD-10-CM | POA: Insufficient documentation

## 2024-03-01 DIAGNOSIS — G620 Drug-induced polyneuropathy: Secondary | ICD-10-CM | POA: Insufficient documentation

## 2024-03-01 DIAGNOSIS — C50512 Malignant neoplasm of lower-outer quadrant of left female breast: Secondary | ICD-10-CM | POA: Diagnosis not present

## 2024-03-01 DIAGNOSIS — C787 Secondary malignant neoplasm of liver and intrahepatic bile duct: Secondary | ICD-10-CM | POA: Diagnosis not present

## 2024-03-01 DIAGNOSIS — Z170 Estrogen receptor positive status [ER+]: Secondary | ICD-10-CM | POA: Diagnosis not present

## 2024-03-01 DIAGNOSIS — Z79811 Long term (current) use of aromatase inhibitors: Secondary | ICD-10-CM | POA: Diagnosis not present

## 2024-03-01 DIAGNOSIS — Z9221 Personal history of antineoplastic chemotherapy: Secondary | ICD-10-CM | POA: Insufficient documentation

## 2024-03-01 DIAGNOSIS — T451X5A Adverse effect of antineoplastic and immunosuppressive drugs, initial encounter: Secondary | ICD-10-CM | POA: Diagnosis not present

## 2024-03-01 DIAGNOSIS — Z9012 Acquired absence of left breast and nipple: Secondary | ICD-10-CM | POA: Insufficient documentation

## 2024-03-01 DIAGNOSIS — C7951 Secondary malignant neoplasm of bone: Secondary | ICD-10-CM | POA: Diagnosis present

## 2024-03-01 DIAGNOSIS — Z923 Personal history of irradiation: Secondary | ICD-10-CM | POA: Insufficient documentation

## 2024-03-01 LAB — CBC WITH DIFFERENTIAL (CANCER CENTER ONLY)
Abs Immature Granulocytes: 0.01 K/uL (ref 0.00–0.07)
Basophils Absolute: 0 K/uL (ref 0.0–0.1)
Basophils Relative: 1 %
Eosinophils Absolute: 0 K/uL (ref 0.0–0.5)
Eosinophils Relative: 2 %
HCT: 32.4 % — ABNORMAL LOW (ref 36.0–46.0)
Hemoglobin: 11.5 g/dL — ABNORMAL LOW (ref 12.0–15.0)
Immature Granulocytes: 0 %
Lymphocytes Relative: 15 %
Lymphs Abs: 0.4 K/uL — ABNORMAL LOW (ref 0.7–4.0)
MCH: 39.1 pg — ABNORMAL HIGH (ref 26.0–34.0)
MCHC: 35.5 g/dL (ref 30.0–36.0)
MCV: 110.2 fL — ABNORMAL HIGH (ref 80.0–100.0)
Monocytes Absolute: 0.3 K/uL (ref 0.1–1.0)
Monocytes Relative: 10 %
Neutro Abs: 2 K/uL (ref 1.7–7.7)
Neutrophils Relative %: 72 %
Platelet Count: 141 K/uL — ABNORMAL LOW (ref 150–400)
RBC: 2.94 MIL/uL — ABNORMAL LOW (ref 3.87–5.11)
RDW: 16.2 % — ABNORMAL HIGH (ref 11.5–15.5)
WBC Count: 2.7 K/uL — ABNORMAL LOW (ref 4.0–10.5)
nRBC: 0 % (ref 0.0–0.2)

## 2024-03-01 LAB — CMP (CANCER CENTER ONLY)
ALT: 19 U/L (ref 0–44)
AST: 106 U/L — ABNORMAL HIGH (ref 15–41)
Albumin: 4.3 g/dL (ref 3.5–5.0)
Alkaline Phosphatase: 120 U/L (ref 38–126)
Anion gap: 12 (ref 5–15)
BUN: 13 mg/dL (ref 8–23)
CO2: 23 mmol/L (ref 22–32)
Calcium: 10.2 mg/dL (ref 8.9–10.3)
Chloride: 103 mmol/L (ref 98–111)
Creatinine: 0.86 mg/dL (ref 0.44–1.00)
GFR, Estimated: >60 mL/min (ref >60)
Glucose, Bld: 116 mg/dL — ABNORMAL HIGH (ref 70–99)
Potassium: 3.9 mmol/L (ref 3.5–5.1)
Sodium: 138 mmol/L (ref 135–145)
Total Bilirubin: 0.6 mg/dL (ref 0.0–1.2)
Total Protein: 6.8 g/dL (ref 6.5–8.1)

## 2024-03-01 NOTE — Assessment & Plan Note (Signed)
°  10/26/17: Left mastectomy: IDC grade 1, 2 foci largest spans 8.5 cm, intermediate grade DCIS, lymphovascular invasion identified, perineural invasion identified, 1/2 lymph nodes positive with extracapsular extension, ER 9200%, PR 5 to 50%, HER-2 negative, Ki-67 10 to 15%, T3N1A   Oncotype DX score 22, intermediate risk, chemotherapy not felt to have significant benefit.   Treatment Summary: 1. Antiestrogen therapy with anastrozole  1 mg daily started 07/15/2016 2. Mastectomy 10/26/2017, Mammaprint low risk luminal type A 3. Followed by adjuvant radiation 12/08/17- 01/26/18  4. Followed by adjuvant antiestrogen therapy anastrozole  started 01/17/2018 (originally started 07/15/2016) 5.  August 2023: Low back pain: Large lesion in the sacrum biopsy of sacrum: 12/26/2021: Metastatic breast cancer, ER 90%, PR 10%, HER2 negative (0) 6. Ibrance  along with Faslodex  started 12/25/2021-10/14/2022 7. Palliative radiation to the sacrum completed 01/19/2022 8.  Enhertu  started 10/21/2022-07/06/2023 9.  Skin biopsy: 06/14/2023: Poorly differentiated adenocarcinoma x 2 biopsies: ER 99%, PR 2%, HER2 1+, Ki-67 25%, Caris molecular testing: HER2 low, hormone receptor positive, ESR 1 mutation (no PIK3CA, no PALB2, no PD-L1, TMB 4) 10. 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  Will continue with the current treatment. Marked Improvement in the CA 27-29 Scans in 3 months and follow-up after that

## 2024-03-01 NOTE — Progress Notes (Signed)
 Specialty Pharmacy Refill Coordination Note  Kathryn Lucas is a 63 y.o. female contacted today regarding refills of specialty medication(s) Capecitabine  (XELODA )   Patient requested Delivery   Delivery date: 03/07/24   Verified address: [address]  [place]   Medication will be filled on: 03/06/24

## 2024-03-01 NOTE — Progress Notes (Signed)
 Patient Care Team: Vernon Velna SAUNDERS, MD as PCP - General (Internal Medicine) Odean Potts, MD as Consulting Physician (Hematology and Oncology) Aron Shoulders, MD as Consulting Physician (General Surgery) Dewey Rush, MD as Consulting Physician (Radiation Oncology) Lenn Aran, MD as Consulting Physician (Radiation Oncology) Lenn Aran, MD as Consulting Physician (Radiation Oncology)  DIAGNOSIS:  Encounter Diagnosis  Name Primary?   Malignant neoplasm of lower-outer quadrant of left breast of female, estrogen receptor positive (HCC) Yes    SUMMARY OF ONCOLOGIC HISTORY: Oncology History  Malignant neoplasm of lower-outer quadrant of left breast of female, estrogen receptor positive (HCC)  06/11/2016 Mammogram   Palpable left breast masses 3:00 position: 2.2 cm; 5:30 position: 2.5 cm; 6:30 position: 0.7 cm   06/19/2016 Initial Diagnosis   Left breast biopsy 3:30: IDC with DCIS grade 1, ER 90%, PR 50%, Ki-67 15%, HER-2 negative ratio 1.13; biopsy 5:30 position: IDC grade 1   07/13/2016 Breast MRI   Large area of abnormal enhancement lower inner and lower outer quadrants left breast spanning 9 cm x 6.4 cm x 5.3 cm, no abnormal enlarged lymph nodes; T3 N0 stage II a (New AJCC staging)    07/15/2016 - 12/08/2017 Anti-estrogen oral therapy   Neoadjuvant anastrozole  1 mg daily   07/17/2016 Oncotype testing   Testing done on the biopsy: Oncotype DX score 22, intermediate risk   02/02/2017 Breast MRI   Left breast multicentric disease unchanged measuring 2.7 x 1.6 cm.  Mass in the non-mass enhancement are also not significantly changed measuring 6.2 x 2.4 cm. new enhancing mass within the outer right breast 7 mm which could be fat necrosis or inclusion cyst    02/09/2017 Imaging   Ultrasound of the right breast lesion noted on MRI: No sonographic finding corresponds to the abnormality noted on MRI   07/13/2017 Cancer Staging   Staging form: Breast, AJCC 8th Edition - Clinical stage  from 07/13/2017: Stage IIA (cT3, cN0, cM0, G1, ER+, PR+, HER2-) - Signed by Crawford Morna Pickle, NP on 05/18/2018   10/26/2017 Surgery   Left mastectomy: IDC grade 1, 2 foci largest spans 8.5 cm, intermediate grade DCIS, lymphovascular invasion identified, perineural invasion identified, 1/2 lymph nodes positive with extracapsular extension, ER 9200%, PR 5 to 50%, HER-2 negative, Ki-67 10 to 15%, T3N1A Mammaprint: low risk   11/02/2017 Cancer Staging   Staging form: Breast, AJCC 8th Edition - Pathologic: No Stage Recommended (ypT3, pN1a, cM0, G1, ER+, PR+, HER2-) - Signed by Odean Potts, MD on 11/02/2017   12/08/2017 - 01/26/2018 Radiation Therapy   Adjuvant radiation therapy    02/2018 -  Anti-estrogen oral therapy   Anastrozole  1 mg daily adjuvant therapy   10/21/2022 - 07/06/2023 Chemotherapy   Patient is on Treatment Plan : BREAST METASTATIC Fam-Trastuzumab Deruxtecan-nxki  (Enhertu ) (5.4) q21d       CHIEF COMPLIANT: F/U on Xeloda   HISTORY OF PRESENT ILLNESS:   History of Present Illness Kathryn Lucas is a 63 year old female with 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  She is receiving capecitabine  and anastrozole  for metastatic disease to liver, bone, and skin. She completed radiation therapy in August and has attended wound care since September for post-radiation skin management.  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 She uses topical emollients to hands three to four times daily and feet once or twice daily. A prior open heel wound is  now closed and covered with a bandaid. She has a resolving shoe-related blister on her foot, thickened skin under toenails managed with pedicures, and notes a healed neck cyst and improving scalp lesion. She is able to wear boots and has no open  wounds.  She denies diarrhea but notes increased stool frequency and avoids dairy and spicy foods due to intolerance. Her throat and voice are stable.  She is aware of her recent laboratory results.  Nov 30, 2023: Follow-up visit for metastatic ER+ breast cancer; patient on Xeloda  and anastrozole  with stable liver and bone metastases, resolved cutaneous nodules, and CA 27-29 decreased to 656. No significant side effects except manageable chemotherapy-induced peripheral neuropathy. Plan to continue current regimen, monitor CA 27-29, and schedule regular scans.     ALLERGIES:  is allergic to percocet [oxycodone -acetaminophen ].  MEDICATIONS:  Current Outpatient Medications  Medication Sig Dispense Refill   Calcium  500-100 MG-UNIT CHEW Chew 1 tablet by mouth daily. 60 tablet    capecitabine  (XELODA ) 500 MG tablet Take 3 tablets (1,500 mg total) by mouth 2 (two) times daily after a meal. 14 days on and 7 days off 84 tablet 6   cholecalciferol (VITAMIN D3) 25 MCG (1000 UNIT) tablet Take 1 tablet (1,000 Units total) by mouth daily.     furosemide  (LASIX ) 20 MG tablet Take 1 tablet (20 mg total) by mouth daily. If needed may take 1/2 tab q am (Patient not taking: Reported on 12/03/2023) 30 tablet 0   lamoTRIgine  (LAMICTAL ) 25 MG tablet Take 1 tablet (25 mg total) by mouth daily. 30 tablet 2   levothyroxine  (SYNTHROID ) 100 MCG tablet Take 1 tablet (100 mcg total) by mouth daily before breakfast. 90 tablet 1   omeprazole  (PRILOSEC) 20 MG capsule TAKE 1 CAPSULE BY MOUTH ONCE DAILY 30 capsule 3   scopolamine  (TRANSDERM-SCOP) 1 MG/3DAYS Place 1 patch (1 mg total) onto the skin every 3 (three) days. 10 patch 0   vitamin C  (ASCORBIC ACID) 250 MG tablet Take 1 tablet (250 mg total) by mouth daily.     Zinc  Acetate, Oral, (ZINC  ACETATE PO) Take by mouth.     No current facility-administered medications for this visit.    PHYSICAL EXAMINATION: ECOG PERFORMANCE STATUS: 1 - Symptomatic but completely  ambulatory  Vitals:   03/01/24 1501  BP: 133/83  Pulse: (!) 112  Resp: 18  Temp: 97.8 F (36.6 C)  SpO2: 100%   Filed Weights   03/01/24 1501  Weight: 156 lb 3.2 oz (70.9 kg)    Physical Exam    (exam performed in the presence of a chaperone)  LABORATORY DATA:  I have reviewed the data as listed    Latest Ref Rng & Units 03/01/2024    2:35 PM 11/30/2023    2:24 PM 11/08/2023    2:34 PM  CMP  Glucose 70 - 99 mg/dL 883  897  90   BUN 8 - 23 mg/dL 13  8  8    Creatinine 0.44 - 1.00 mg/dL 9.13  9.51  9.51   Sodium 135 - 145 mmol/L 138  138  136   Potassium 3.5 - 5.1 mmol/L 3.9  4.0  4.2   Chloride 98 - 111 mmol/L 103  110  107   CO2 22 - 32 mmol/L 23  25  23    Calcium  8.9 - 10.3 mg/dL 89.7  8.3  8.5   Total Protein 6.5 - 8.1 g/dL 6.8  6.1  5.9   Total Bilirubin 0.0 - 1.2  mg/dL 0.6  0.6  0.5   Alkaline Phos 38 - 126 U/L 120  138  151   AST 15 - 41 U/L 106  32  38   ALT 0 - 44 U/L 19  10  11      Lab Results  Component Value Date   WBC 2.7 (L) 03/01/2024   HGB 11.5 (L) 03/01/2024   HCT 32.4 (L) 03/01/2024   MCV 110.2 (H) 03/01/2024   PLT 141 (L) 03/01/2024   NEUTROABS 2.0 03/01/2024    ASSESSMENT & PLAN:  Malignant neoplasm of lower-outer quadrant of left breast of female, estrogen receptor positive (HCC)  10/26/17: Left mastectomy: IDC grade 1, 2 foci largest spans 8.5 cm, intermediate grade DCIS, lymphovascular invasion identified, perineural invasion identified, 1/2 lymph nodes positive with extracapsular extension, ER 9200%, PR 5 to 50%, HER-2 negative, Ki-67 10 to 15%, T3N1A   Oncotype DX score 22, intermediate risk, chemotherapy not felt to have significant benefit.   Treatment Summary: 1. Antiestrogen therapy with anastrozole  1 mg daily started 07/15/2016 2. Mastectomy 10/26/2017, Mammaprint low risk luminal type A 3. Followed by adjuvant radiation 12/08/17- 01/26/18  4. Followed by adjuvant antiestrogen therapy anastrozole  started 01/17/2018 (originally  started 07/15/2016) 5.  August 2023: Low back pain: Large lesion in the sacrum biopsy of sacrum: 12/26/2021: Metastatic breast cancer, ER 90%, PR 10%, HER2 negative (0) 6. Ibrance  along with Faslodex  started 12/25/2021-10/14/2022 7. Palliative radiation to the sacrum completed 01/19/2022 8.  Enhertu  started 10/21/2022-07/06/2023 9.  Skin biopsy: 06/14/2023: Poorly differentiated adenocarcinoma x 2 biopsies: ER 99%, PR 2%, HER2 1+, Ki-67 25%, Caris molecular testing: HER2 low, hormone receptor positive, ESR 1 mutation (no PIK3CA, no PALB2, no PD-L1, TMB 4) 10. 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  Will continue with the current treatment. Marked Improvement in the CA 27-29 Scans in 3 months and follow-up after that  ------------------------------------- Assessment and Plan Assessment & Plan Estrogen receptor positive malignant neoplasm of lower-outer quadrant of left breast Metastatic, estrogen receptor positive breast cancer with stable disease on current therapy. No evidence of progression. Tolerates capecitabine   without significant gastrointestinal toxicity. Hematologic parameters stable. Prognosis guarded but stable. - Continued capecitabine  (Xeloda ) 1500 mg BID, 14 days on/7 days off, as tolerated. - Monitored for hand-foot syndrome, peripheral neuropathy, cytopenias, and diarrhea. - Maintained routine laboratory monitoring: CBC and CA 27-29 at each visit. - Held capecitabine  if neutrophils <1.0; transfused if hemoglobin significantly <11.5 or platelets <100. - Continued Xgeva  (denosumab ) for osseous metastases as scheduled. - Continued anastrozole  1 mg daily for hormone receptor positive disease. - Ordered surveillance imaging for next visit at [facility] location. - Continued clinical assessment for symptoms, physical examination, and monitoring for long-term effects of chemotherapy and endocrine therapy. - Scheduled follow-up every three months at [facility] location.  Chemotherapy-induced peripheral neuropathy Persistent neuropathy in hands and feet secondary to capecitabine . Managed with topical emollients and physical activity. Completed physical therapy. Monitored as part of survivorship plan. - Encouraged continued use of topical emollients for hands and feet. - Advised to continue gentle physical activity, including chair yoga and gym exercises. - No further physical therapy required at this time. - Will consider neurology referral if neuropathy progresses.  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 Managed with topical agents, gentle foot care, and continued wound care. Tolerates capecitabine  without dose-limiting toxicity. - Advised frequent application of topical emollients to hands and feet. - Recommended gentle foot care and regular pedicures, avoiding aggressive techniques. - Encouraged to keep feet clean and protected. - Continued wound care as needed. - Will hold  capecitabine  if hand-foot syndrome becomes dose-limiting or if open wounds develop.      Orders Placed This Encounter  Procedures   CT CHEST ABDOMEN PELVIS W CONTRAST    Standing Status:   Future    Expected Date:   05/23/2024    Expiration Date:   03/01/2025    If indicated for the ordered procedure, I authorize the administration of contrast media per Radiology protocol:   Yes    Does the patient have a contrast media/X-ray dye allergy?:   No    Preferred imaging location?:   [facility]    Release to patient:   Immediate    If indicated for the ordered procedure, I authorize the administration of oral contrast media per Radiology protocol:   No    Reason for no oral contrast::   breast   The patient has a good understanding of the overall plan. she agrees with it. she will call with any problems that may develop before the next visit here.  I personally spent a total of 30 minutes in the care of the patient today including preparing to see the patient, getting/reviewing separately obtained history, performing a medically appropriate exam/evaluation, counseling and educating, placing orders, referring and communicating with other health care professionals, documenting clinical information in the EHR, independently interpreting results, communicating results, and coordinating care.   Viinay K Adynn Caseres, MD 03/01/2024

## 2024-03-02 ENCOUNTER — Other Ambulatory Visit: Payer: Self-pay

## 2024-03-02 ENCOUNTER — Telehealth: Payer: Self-pay

## 2024-03-02 ENCOUNTER — Encounter: Payer: Self-pay | Admitting: Hematology and Oncology

## 2024-03-02 LAB — CANCER ANTIGEN 27.29: CA 27.29: 407.3 U/mL — ABNORMAL HIGH (ref 0.0–38.6)

## 2024-03-02 NOTE — Telephone Encounter (Signed)
 Pt messaged with concerns about recent lab results, AST increased from 32 3 months ago to 106 yesterday 12/17, related to her Xeloda . Dr. Odean reviewed labs and advised that we recheck in 1 month and f/u with Norleen regarding Xeloda  concerns/education. Pt verbalized understanding.

## 2024-03-17 ENCOUNTER — Encounter: Attending: Physician Assistant | Admitting: Physician Assistant

## 2024-03-17 DIAGNOSIS — T66XXXA Radiation sickness, unspecified, initial encounter: Secondary | ICD-10-CM | POA: Insufficient documentation

## 2024-03-17 DIAGNOSIS — L988 Other specified disorders of the skin and subcutaneous tissue: Secondary | ICD-10-CM | POA: Insufficient documentation

## 2024-03-17 DIAGNOSIS — E038 Other specified hypothyroidism: Secondary | ICD-10-CM | POA: Diagnosis not present

## 2024-03-17 DIAGNOSIS — L98421 Non-pressure chronic ulcer of back limited to breakdown of skin: Secondary | ICD-10-CM | POA: Diagnosis not present

## 2024-03-17 DIAGNOSIS — L0211 Cutaneous abscess of neck: Secondary | ICD-10-CM | POA: Diagnosis not present

## 2024-03-17 DIAGNOSIS — L98492 Non-pressure chronic ulcer of skin of other sites with fat layer exposed: Secondary | ICD-10-CM | POA: Insufficient documentation

## 2024-03-17 DIAGNOSIS — X58XXXA Exposure to other specified factors, initial encounter: Secondary | ICD-10-CM | POA: Diagnosis not present

## 2024-03-17 DIAGNOSIS — C44599 Other specified malignant neoplasm of skin of other part of trunk: Secondary | ICD-10-CM | POA: Insufficient documentation

## 2024-03-20 ENCOUNTER — Other Ambulatory Visit (HOSPITAL_COMMUNITY): Payer: Self-pay

## 2024-03-20 ENCOUNTER — Encounter: Payer: Self-pay | Admitting: Hematology and Oncology

## 2024-03-21 ENCOUNTER — Other Ambulatory Visit: Payer: Self-pay

## 2024-03-21 NOTE — Progress Notes (Signed)
 Specialty Pharmacy Refill Coordination Note  Kathryn Lucas is a 64 y.o. female contacted today regarding refills of specialty medication(s) Capecitabine  (XELODA )   Patient requested Delivery   Delivery date: 03/30/24   Verified address: [address]  [place]   Medication will be filled on: 03/29/24

## 2024-03-27 ENCOUNTER — Other Ambulatory Visit: Payer: Self-pay

## 2024-03-27 ENCOUNTER — Encounter: Payer: Self-pay | Admitting: Hematology and Oncology

## 2024-03-27 ENCOUNTER — Other Ambulatory Visit (HOSPITAL_COMMUNITY): Payer: Self-pay

## 2024-03-27 MED ORDER — AZITHROMYCIN 250 MG PO TABS: ORAL_TABLET | ORAL | 0 refills | Status: AC

## 2024-03-27 NOTE — Progress Notes (Signed)
 Specialty Pharmacy Ongoing Clinical Assessment Note  Kathryn Lucas is a 64 y.o. female who is being followed by the specialty pharmacy service for RxSp Oncology   Patient's specialty medication(s) reviewed today: Capecitabine  (XELODA )   Missed doses in the last 4 weeks: 0   Patient/Caregiver did not have any additional questions or concerns.   Therapeutic benefit summary: Patient is achieving benefit   Adverse events/side effects summary: Experienced adverse events/side effects (feet are red/scaly, dry mouth, and fatigue. States this is all tolerable/manageable)   Patient's therapy is appropriate to: Continue    Goals Addressed             This Visit's Progress    Maintain optimal adherence to therapy   On track    Patient is on track. Patient will maintain adherence.         Follow up: 3 months  [facility]

## 2024-03-29 ENCOUNTER — Other Ambulatory Visit: Payer: Self-pay

## 2024-03-31 ENCOUNTER — Other Ambulatory Visit: Payer: Self-pay

## 2024-04-03 ENCOUNTER — Other Ambulatory Visit: Payer: Self-pay

## 2024-04-03 NOTE — Progress Notes (Signed)
 Clinical Intervention Note  Clinical Intervention Notes: Patient called to discuss cycle dates. She had to pause her previous cycle per provider due to antibiotic use from 1/12-1/16. Confirmed that she started her previous cycle on 03/16/24 so at the point she paused she should have had 4 days remaining. She restarted on 1/17 and states she only had enough tablets through today, 1/19, so dates may have been off by 1 day from when she stopped. Patient will start her full week off tomorrow 1/20 and restart next cycle on 1/27 with a new bottle that was delivered recently.   Clinical Intervention Outcomes: Improved therapy adherence   Kathryn Lucas Specialty Pharmacist

## 2024-04-04 ENCOUNTER — Other Ambulatory Visit: Payer: Self-pay | Admitting: Hematology and Oncology

## 2024-04-05 ENCOUNTER — Other Ambulatory Visit: Payer: Self-pay

## 2024-04-20 ENCOUNTER — Encounter: Admitting: Physician Assistant

## 2024-04-21 ENCOUNTER — Encounter: Admitting: Physician Assistant

## 2024-04-26 ENCOUNTER — Inpatient Hospital Stay

## 2024-05-19 ENCOUNTER — Other Ambulatory Visit

## 2024-05-19 ENCOUNTER — Encounter: Admitting: Physician Assistant

## 2024-05-30 ENCOUNTER — Inpatient Hospital Stay: Attending: Hematology and Oncology | Admitting: Hematology and Oncology

## 2024-06-02 ENCOUNTER — Ambulatory Visit: Admitting: Internal Medicine

## 2024-07-12 ENCOUNTER — Inpatient Hospital Stay

## 2024-09-06 ENCOUNTER — Inpatient Hospital Stay

## 2024-11-01 ENCOUNTER — Inpatient Hospital Stay

## 2024-12-27 ENCOUNTER — Inpatient Hospital Stay

## 2025-02-21 ENCOUNTER — Inpatient Hospital Stay
